# Patient Record
Sex: Female | Born: 1935
Health system: Southern US, Community
[De-identification: ages and names within clinical notes are randomized; demographics above are authoritative.]

## PROBLEM LIST (undated history)

## (undated) DIAGNOSIS — Z860100 Personal history of colon polyps, unspecified: Secondary | ICD-10-CM

## (undated) DIAGNOSIS — M2550 Pain in unspecified joint: Secondary | ICD-10-CM

## (undated) DIAGNOSIS — C3431 Malignant neoplasm of lower lobe, right bronchus or lung: Secondary | ICD-10-CM

## (undated) DIAGNOSIS — R112 Nausea with vomiting, unspecified: Secondary | ICD-10-CM

## (undated) DIAGNOSIS — G4700 Insomnia, unspecified: Secondary | ICD-10-CM

## (undated) DIAGNOSIS — J40 Bronchitis, not specified as acute or chronic: Secondary | ICD-10-CM

## (undated) DIAGNOSIS — J069 Acute upper respiratory infection, unspecified: Secondary | ICD-10-CM

## (undated) DIAGNOSIS — Z7189 Other specified counseling: Secondary | ICD-10-CM

## (undated) DIAGNOSIS — F419 Anxiety disorder, unspecified: Secondary | ICD-10-CM

## (undated) DIAGNOSIS — E039 Hypothyroidism, unspecified: Secondary | ICD-10-CM

## (undated) DIAGNOSIS — IMO0001 Reserved for inherently not codable concepts without codable children: Secondary | ICD-10-CM

## (undated) DIAGNOSIS — Z8669 Personal history of other diseases of the nervous system and sense organs: Secondary | ICD-10-CM

## (undated) DIAGNOSIS — Z923 Personal history of irradiation: Secondary | ICD-10-CM

## (undated) DIAGNOSIS — H9190 Unspecified hearing loss, unspecified ear: Secondary | ICD-10-CM

## (undated) DIAGNOSIS — R918 Other nonspecific abnormal finding of lung field: Secondary | ICD-10-CM

## (undated) DIAGNOSIS — J95811 Postprocedural pneumothorax: Secondary | ICD-10-CM

## (undated) DIAGNOSIS — E0590 Thyrotoxicosis, unspecified without thyrotoxic crisis or storm: Secondary | ICD-10-CM

## (undated) DIAGNOSIS — E7800 Pure hypercholesterolemia, unspecified: Secondary | ICD-10-CM

## (undated) DIAGNOSIS — M791 Myalgia, unspecified site: Secondary | ICD-10-CM

## (undated) DIAGNOSIS — C3490 Malignant neoplasm of unspecified part of unspecified bronchus or lung: Secondary | ICD-10-CM

## (undated) DIAGNOSIS — E785 Hyperlipidemia, unspecified: Secondary | ICD-10-CM

## (undated) DIAGNOSIS — Z9889 Other specified postprocedural states: Secondary | ICD-10-CM

## (undated) DIAGNOSIS — M549 Dorsalgia, unspecified: Secondary | ICD-10-CM

## (undated) DIAGNOSIS — C4490 Unspecified malignant neoplasm of skin, unspecified: Secondary | ICD-10-CM

## (undated) DIAGNOSIS — M1990 Unspecified osteoarthritis, unspecified site: Secondary | ICD-10-CM

## (undated) DIAGNOSIS — I10 Essential (primary) hypertension: Secondary | ICD-10-CM

## (undated) DIAGNOSIS — I749 Embolism and thrombosis of unspecified artery: Secondary | ICD-10-CM

## (undated) DIAGNOSIS — Z8489 Family history of other specified conditions: Secondary | ICD-10-CM

## (undated) HISTORY — PX: APPENDECTOMY: SHX54

## (undated) HISTORY — DX: Hyperlipidemia, unspecified: E78.5

## (undated) HISTORY — PX: HEMORRHOID SURGERY: SHX153

## (undated) HISTORY — DX: Other specified counseling: Z71.89

## (undated) HISTORY — PX: TONSILLECTOMY: SUR1361

## (undated) HISTORY — DX: Hypothyroidism, unspecified: E03.9

## (undated) HISTORY — DX: Unspecified osteoarthritis, unspecified site: M19.90

## (undated) HISTORY — DX: Postprocedural pneumothorax: J95.811

## (undated) HISTORY — DX: Essential (primary) hypertension: I10

## (undated) HISTORY — PX: EYE SURGERY: SHX253

## (undated) HISTORY — PX: DILATION AND CURETTAGE OF UTERUS: SHX78

## (undated) HISTORY — DX: Pure hypercholesterolemia, unspecified: E78.00

## (undated) HISTORY — PX: COLONOSCOPY: SHX174

## (undated) HISTORY — DX: Malignant neoplasm of lower lobe, right bronchus or lung: C34.31

## (undated) HISTORY — DX: Malignant neoplasm of unspecified part of unspecified bronchus or lung: C34.90

## (undated) HISTORY — DX: Myalgia, unspecified site: M79.10

## (undated) HISTORY — PX: CHOLECYSTECTOMY: SHX55

## (undated) HISTORY — PX: ROTATOR CUFF REPAIR: SHX139

## (undated) HISTORY — PX: ABDOMINAL HYSTERECTOMY: SHX81

---

## 1997-08-01 ENCOUNTER — Other Ambulatory Visit: Admission: RE | Admit: 1997-08-01 | Discharge: 1997-08-01 | Payer: Self-pay | Admitting: Cardiology

## 1997-10-31 ENCOUNTER — Observation Stay (HOSPITAL_COMMUNITY): Admission: RE | Admit: 1997-10-31 | Discharge: 1997-11-01 | Payer: Self-pay | Admitting: General Surgery

## 1998-05-09 ENCOUNTER — Ambulatory Visit (HOSPITAL_COMMUNITY): Admission: RE | Admit: 1998-05-09 | Discharge: 1998-05-09 | Payer: Self-pay | Admitting: General Surgery

## 1999-06-19 ENCOUNTER — Encounter: Admission: RE | Admit: 1999-06-19 | Discharge: 1999-06-19 | Payer: Self-pay | Admitting: Cardiology

## 1999-06-19 ENCOUNTER — Encounter: Payer: Self-pay | Admitting: Cardiology

## 1999-07-16 ENCOUNTER — Encounter: Payer: Self-pay | Admitting: Orthopedic Surgery

## 1999-07-16 ENCOUNTER — Encounter: Admission: RE | Admit: 1999-07-16 | Discharge: 1999-07-16 | Payer: Self-pay | Admitting: Orthopedic Surgery

## 1999-09-02 ENCOUNTER — Ambulatory Visit (HOSPITAL_COMMUNITY): Admission: RE | Admit: 1999-09-02 | Discharge: 1999-09-02 | Payer: Self-pay | Admitting: Gastroenterology

## 1999-09-02 ENCOUNTER — Encounter (INDEPENDENT_AMBULATORY_CARE_PROVIDER_SITE_OTHER): Payer: Self-pay | Admitting: Specialist

## 1999-11-19 ENCOUNTER — Ambulatory Visit (HOSPITAL_BASED_OUTPATIENT_CLINIC_OR_DEPARTMENT_OTHER): Admission: RE | Admit: 1999-11-19 | Discharge: 1999-11-20 | Payer: Self-pay | Admitting: Orthopedic Surgery

## 1999-11-19 ENCOUNTER — Encounter (INDEPENDENT_AMBULATORY_CARE_PROVIDER_SITE_OTHER): Payer: Self-pay | Admitting: *Deleted

## 2000-07-09 ENCOUNTER — Encounter: Payer: Self-pay | Admitting: Cardiology

## 2000-07-09 ENCOUNTER — Encounter: Admission: RE | Admit: 2000-07-09 | Discharge: 2000-07-09 | Payer: Self-pay | Admitting: Cardiology

## 2001-07-15 ENCOUNTER — Encounter: Payer: Self-pay | Admitting: Cardiology

## 2001-07-15 ENCOUNTER — Encounter: Admission: RE | Admit: 2001-07-15 | Discharge: 2001-07-15 | Payer: Self-pay | Admitting: Cardiology

## 2002-08-07 ENCOUNTER — Encounter: Admission: RE | Admit: 2002-08-07 | Discharge: 2002-08-07 | Payer: Self-pay | Admitting: Cardiology

## 2002-08-07 ENCOUNTER — Encounter: Payer: Self-pay | Admitting: Cardiology

## 2008-03-19 ENCOUNTER — Encounter: Admission: RE | Admit: 2008-03-19 | Discharge: 2008-03-19 | Payer: Self-pay | Admitting: Orthopedic Surgery

## 2009-08-08 ENCOUNTER — Encounter: Admission: RE | Admit: 2009-08-08 | Discharge: 2009-08-08 | Payer: Self-pay | Admitting: Otolaryngology

## 2009-11-29 ENCOUNTER — Ambulatory Visit: Payer: Self-pay | Admitting: Cardiology

## 2010-02-04 ENCOUNTER — Ambulatory Visit: Payer: Self-pay | Admitting: Cardiology

## 2010-02-27 ENCOUNTER — Encounter: Admission: RE | Admit: 2010-02-27 | Discharge: 2010-02-27 | Payer: Self-pay | Admitting: Neurosurgery

## 2010-06-12 ENCOUNTER — Ambulatory Visit (INDEPENDENT_AMBULATORY_CARE_PROVIDER_SITE_OTHER): Payer: Medicare Other | Admitting: Cardiology

## 2010-06-12 DIAGNOSIS — I119 Hypertensive heart disease without heart failure: Secondary | ICD-10-CM

## 2010-06-12 DIAGNOSIS — Z79899 Other long term (current) drug therapy: Secondary | ICD-10-CM

## 2010-06-12 DIAGNOSIS — E7800 Pure hypercholesterolemia, unspecified: Secondary | ICD-10-CM

## 2010-09-12 NOTE — Op Note (Signed)
[place]  Patient:    MATTEA, SEGER                        MRN: [id number] Proc. Date: 11/19/99 Adm. Date:  [id number] Attending:  Teena Dunk                           Operative Report  INDICATIONS:  A 77 year old with severe atrophic rotator cuff tear with weakness and pain, desired correction, possible overnight.  She was advised that this may or may not be possible based on the severity of her tear. Additionally, she complained of a small cyst lateral aspect of the ankle and wanted to have this removed as well.  PREOPERATIVE DIAGNOSES: 1. Severe complete tear infraspinatus and supraspinatus tendon right shoulder. 2. Labral tear. 3. Impingement acromioclavicular joint arthritis. 4. Cyst right ankle.  OPERATIONS: 1. Open rotator cuff repair acromioplasty. 2. Distal clavicle excision. 3. Arthroscopic debridement torn labrum. 4. Excision of cyst right ankle.  SURGEON:  Sharlot Gowda., M.D.  ANESTHESIA:  General.  DESCRIPTION OF PROCEDURE:  Arthroscope through posterior/anterior lateral portal.  Systematic inspection of the shoulder showed a severe rotator cuff tear with retraction.  The patients main complaint was pain with weakness and she desired correction if at all possible.  For this reason, after debridement of a degenerative labral tear, the procedure was converted to an open procedure. Splitting of the deltoid in the fascia over the distal clavicle with deltoid split 3 cm distal to the tip of the acromion.  With careful dissection, a very thick, hypertrophic AC joint was encountered with a very prominent acromion and CA ligament, calcification of the CA ligament requiring release of the CA ligament, as well as, resection of about 8-9 mm of the acromion and about the distal 1 cm of the clavicle.  The cuff itself was severely torn complete tear with severe retraction infraspinatus and supraspinatus with  tendon that was really very atrophic with extensive mobilization and freshening of the edges, the ______ cuff edges could be brought probably together, at least 80%.  It was converted to a longitudinal tear with three #2 nonabsorbable sutures.  Again, the apex could not be repaired back down bone-to-bone in its normal anatomic location and therefore, the large defect was converted to a small triangular-shaped defect. The edges of which were fixed with Mitek anchors.  The biceps tendon could not be identified, was chronically torn.  Given the severity of the problem, I felt that reasonable repair was obtained particularly given the tissue the patient had to work with.  The wound was irrigated, the deltoid was meticulously repaired with #1 Ti-Cron, the subcutaneous tissues with 2-0 Vicryl and the skin with the stapling device.  Portals were closed with staples.  On the lateral ankle, a small incision of about 3 cm was made and a cyst removed from the lateral aspect of the ankle.  Seemed to be almost involving a portion of a small branch of a vein that was a hardened type cyst, possibly consistent with a small AV malformation.  It was coagulated at each end, removed and sent to pathology.  Irrigation was accomplished, followed by closure with interrupted 4-0 nylon and Marcaine without epinephrine infiltrated in the wound.  A lightly compressive sterile dressing applied, taken to recovery room in stable condition. DD:  11/19/99 TD:  11/20/99 Job: [id number]

## 2010-09-12 NOTE — Procedures (Signed)
[place]  Patient:    Samantha Quinn, Samantha Quinn                        MRN: [id number] Proc. Date: 09/02/99 Adm. Date:  [id number] Attending:  Louie Bun CC:         Sheppard Plumber. Earlene Plater, M.D.             Thomas A. Patty Sermons, M.D.                           Procedure Report  PROCEDURE:  Colonoscopy with polypectomy.  INDICATIONS:  Occasional rectal bleeding and constipation in a 78 year old female with no prior colon screening.  PROCEDURE:  The patient was placed in the left lateral decubitus position and placed on the pulse monitor with continuous low-flow oxygen delivered by nasal cannula.  She was sedated with 62.5 mcg IV fentanyl and 5 mg IV Versed.  The Olympus video colonoscope was inserted into the rectum and advanced to the cecum, confirmed by transillumination of McBurneys point and visualization of the ileocecal valve and appendiceal orifice.  The prep was excellent.  The cecum, ascending, transverse, descending and sigmoid colon all appeared normal with no masses, polyps, diverticuli or other mucosal abnormalities.  Within the rectum, was seen an 8 mm sessile polyp, which was fulgurated by hot biopsy.  There were also some small internal hemorrhoids seen on retroflexion. The colonoscope was then withdrawn and the patient returned to the recovery room in stable condition.  She tolerated the procedure well and there were no immediate complications.  IMPRESSION: 1. Internal hemorrhoids. 2. An 8 mm rectal polyp.  PLAN:  Await histology for determination of need for future colon screening and will treat hemorrhoids symptomatically. DD:  09/02/99 TD:  09/03/99 Job: [id number]

## 2010-09-29 ENCOUNTER — Other Ambulatory Visit: Payer: Medicare Other | Admitting: *Deleted

## 2010-09-29 ENCOUNTER — Ambulatory Visit: Payer: Medicare Other | Admitting: Cardiology

## 2010-10-14 ENCOUNTER — Encounter: Payer: Self-pay | Admitting: *Deleted

## 2010-10-20 ENCOUNTER — Other Ambulatory Visit (INDEPENDENT_AMBULATORY_CARE_PROVIDER_SITE_OTHER): Payer: Medicare Other | Admitting: *Deleted

## 2010-10-20 ENCOUNTER — Encounter: Payer: Self-pay | Admitting: Cardiology

## 2010-10-20 ENCOUNTER — Ambulatory Visit (INDEPENDENT_AMBULATORY_CARE_PROVIDER_SITE_OTHER): Payer: Medicare Other | Admitting: Cardiology

## 2010-10-20 DIAGNOSIS — Z79899 Other long term (current) drug therapy: Secondary | ICD-10-CM

## 2010-10-20 DIAGNOSIS — E785 Hyperlipidemia, unspecified: Secondary | ICD-10-CM

## 2010-10-20 DIAGNOSIS — E7800 Pure hypercholesterolemia, unspecified: Secondary | ICD-10-CM

## 2010-10-20 DIAGNOSIS — I10 Essential (primary) hypertension: Secondary | ICD-10-CM | POA: Insufficient documentation

## 2010-10-20 DIAGNOSIS — M1990 Unspecified osteoarthritis, unspecified site: Secondary | ICD-10-CM

## 2010-10-20 DIAGNOSIS — E039 Hypothyroidism, unspecified: Secondary | ICD-10-CM

## 2010-10-20 DIAGNOSIS — E119 Type 2 diabetes mellitus without complications: Secondary | ICD-10-CM

## 2010-10-20 DIAGNOSIS — I119 Hypertensive heart disease without heart failure: Secondary | ICD-10-CM

## 2010-10-20 LAB — LIPID PANEL
HDL: 52 mg/dL (ref >39.00)
Total CHOL/HDL Ratio: 3
Triglycerides: 106 mg/dL (ref 0.0–149.0)
VLDL: 21.2 mg/dL (ref 0.0–40.0)

## 2010-10-20 LAB — HEPATIC FUNCTION PANEL
AST: 21 U/L (ref 0–37)
Albumin: 4.2 g/dL (ref 3.5–5.2)
Alkaline Phosphatase: 61 U/L (ref 39–117)
Bilirubin, Direct: 0.1 mg/dL (ref 0.0–0.3)

## 2010-10-20 LAB — BASIC METABOLIC PANEL
BUN: 19 mg/dL (ref 6–23)
Calcium: 9.1 mg/dL (ref 8.4–10.5)
Creatinine, Ser: 0.6 mg/dL (ref 0.4–1.2)
Glucose, Bld: 125 mg/dL — ABNORMAL HIGH (ref 70–99)
Potassium: 4.3 mEq/L (ref 3.5–5.1)
Sodium: 140 mEq/L (ref 135–145)

## 2010-10-20 NOTE — Assessment & Plan Note (Signed)
Patient has a history of essential hypertension.  She has not been experiencing any dizziness or syncope or headaches.  He has not been having any symptoms of congestive heart failure or angina pectoris.

## 2010-10-20 NOTE — Assessment & Plan Note (Signed)
The patient has a history of hypothyroidism.  In 1992 she had acute thyroiditis and multinodular goiter.  She has been on suppressive doses of Synthroid.  She is clinically euthyroid.

## 2010-10-20 NOTE — Assessment & Plan Note (Signed)
The patient has a history of adult onset diabetes mellitus.  She's not having any hypoglycemic episodes.  Her appetite is good.  Her weight has increased 2 pounds since last visit.

## 2010-10-20 NOTE — Assessment & Plan Note (Signed)
The patient has a long history of osteoarthritis and low back pain.  Recently she's been having more symptoms of sciatica and in the past has been told of a ruptured disc.  She had an epidural steroid injection in November 2011 which has helped her and she is seeing her neurosurgeon Dr. Channing Mutters next week who may offer her another epidural steroid injection.

## 2010-10-20 NOTE — Progress Notes (Signed)
Samantha Quinn Date of Birth:  07/10/1927 [facility] / [facility] [address]   [address] [place], [place]  [address] [phone]           Fax   [phone]  History of Present Illness: This pleasant 77 year old woman is seen for a scheduled 4 month followup office visit.  She has generally been feeling well.  She has a history of diabetes mellitus, essential hypertension, and hypothyroidism.  She has not been expressing any chest pain or shortness of breath.  She does not have any history of ischemic heart disease.  He does not have any history of congestive heart failure  Current Outpatient Prescriptions  Medication Sig Dispense Refill  . amitriptyline (ELAVIL) 10 MG tablet Take 10 mg by mouth at bedtime.        [name] aspirin 81 MG tablet Take 81 mg by mouth as needed.        . bisoprolol-hydrochlorothiazide (ZIAC) 2.5-6.25 MG per tablet Take 1 tablet by mouth daily.        . Calcium Carbonate-Vitamin D (CALTRATE 600+D PO) Take 1 tablet by mouth. occasionally       . ibuprofen (ADVIL,MOTRIN) 200 MG tablet Take 200 mg by mouth as needed.        [name] levothyroxine (SYNTHROID, LEVOTHROID) 100 MCG tablet Take 100 mcg by mouth every other day.        . lovastatin (MEVACOR) 20 MG tablet Take 20 mg by mouth at bedtime. Taking every other day      . metFORMIN (GLUMETZA) 500 MG (MOD) 24 hr tablet Take 500 mg by mouth 2 (two) times daily.        . Naproxen Sodium (ALEVE) 220 MG CAPS Take 1 capsule by mouth as needed.        . ramipril (ALTACE) 2.5 MG capsule Take 2.5 mg by mouth daily.        [name] DISCONTD: Acetaminophen (TYLENOL ARTHRITIS PAIN PO) Take 1 capsule by mouth as needed.          Allergies  Allergen Reactions  . Demerol     nausea    Patient Active Problem List  Diagnoses  . Benign hypertensive heart disease without heart failure  . Hypercholesterolemia  . Diabetes mellitus  . Hypothyroidism (acquired)  . Osteoarthritis    History  Smoking status  . Never  Smoker   Smokeless tobacco  . Not on file    History  Alcohol Use No    Family History  Problem Relation Age of Onset  . Arthritis Sister   . Arthritis Sister   . Diabetes Brother   . Hypertension Mother     Review of Systems: Constitutional: no fever chills diaphoresis or fatigue or change in weight.  Head and neck: no hearing loss, no epistaxis, no photophobia or visual disturbance. Respiratory: No cough, shortness of breath or wheezing. Cardiovascular: No chest pain peripheral edema, palpitations. Gastrointestinal: No abdominal distention, no abdominal pain, no change in bowel habits hematochezia or melena. Genitourinary: No dysuria, no frequency, no urgency, no nocturia. Musculoskeletal:No arthralgias,  no gait disturbance or myalgias. Neurological: No dizziness, no headaches, no numbness, no seizures, no syncope, no weakness, no tremors. Hematologic: No lymphadenopathy, no easy bruising. Psychiatric: No confusion, no hallucinations, no sleep disturbance.    Physical Exam: Filed Vitals:   10/20/10 0937  BP: 128/70  Pulse: 66  The general appearance feels a well-developed well-nourished woman in no distress.The head and neck exam reveals pupils equal  and reactive.  Extraocular movements are full.  There is no scleral icterus.  The mouth and pharynx are normal.  The neck is supple.  The carotids reveal no bruits.  The jugular venous pressure is normal.  The  thyroid is not enlarged.  There is no lymphadenopathy.  The chest is clear to percussion and auscultation.  There are no rales or rhonchi.  Expansion of the chest is symmetrical.  The precordium is quiet.  The first heart sound is normal.  The second heart sound is physiologically split.  There is no murmur gallop rub or click.  There is no abnormal lift or heave.  The abdomen is soft and nontender.  The bowel sounds are normal.  The liver and spleen are not enlarged.  There are no abdominal masses.  There are no abdominal  bruits.  Extremities reveal good pedal pulses.  There is no phlebitis or edema.  There is no cyanosis or clubbing.  Strength is normal and symmetrical in all extremities.  There is no lateralizing weakness.  There are no sensory deficits.  The skin is warm and dry.  There is no rash.   Assessment / Plan: Continue same medication.  Recheck in 4 months for office visit and fasting lab work.

## 2010-10-22 ENCOUNTER — Telehealth: Payer: Self-pay | Admitting: *Deleted

## 2010-10-22 NOTE — Telephone Encounter (Signed)
Advised patient of lab work

## 2010-11-03 ENCOUNTER — Other Ambulatory Visit: Payer: Self-pay | Admitting: Neurosurgery

## 2010-11-03 DIAGNOSIS — M5126 Other intervertebral disc displacement, lumbar region: Secondary | ICD-10-CM

## 2010-11-07 ENCOUNTER — Ambulatory Visit
Admission: RE | Admit: 2010-11-07 | Discharge: 2010-11-07 | Disposition: A | Discharge status: Home / Self Care | Payer: Medicare Other | Source: Ambulatory Visit | Attending: Neurosurgery | Admitting: Neurosurgery

## 2010-11-07 VITALS — BP 140/64 | HR 61

## 2010-11-07 DIAGNOSIS — M5126 Other intervertebral disc displacement, lumbar region: Secondary | ICD-10-CM

## 2010-11-07 MED ORDER — IOHEXOL 180 MG/ML  SOLN
1.0000 mL | Freq: Once | INTRAMUSCULAR | Status: AC | PRN
  Administered 2010-11-07: 1 mL via EPIDURAL

## 2010-11-07 MED ORDER — METHYLPREDNISOLONE ACETATE 40 MG/ML INJ SUSP (RADIOLOG
120.0000 mg | Freq: Once | INTRAMUSCULAR | Status: AC
  Administered 2010-11-07: 120 mg via EPIDURAL

## 2011-01-05 ENCOUNTER — Telehealth: Payer: Self-pay | Admitting: Cardiology

## 2011-01-05 NOTE — Telephone Encounter (Signed)
Pt received recall letter to schedule 4 MTH OV, Lp, HFP, Bmet,TSH, FT4.  Dr. Yevonne Pax next available OV morning visit is not until 12/10.  Pt would like to try to get in earlier and would like to speak with nurse.  Please call pt for same day if able to get in earlier.

## 2011-01-05 NOTE — Telephone Encounter (Signed)
Appointment scheduled.

## 2011-02-27 ENCOUNTER — Encounter: Payer: Self-pay | Admitting: Cardiology

## 2011-02-27 ENCOUNTER — Ambulatory Visit (INDEPENDENT_AMBULATORY_CARE_PROVIDER_SITE_OTHER): Payer: Medicare Other | Admitting: Cardiology

## 2011-02-27 ENCOUNTER — Other Ambulatory Visit (INDEPENDENT_AMBULATORY_CARE_PROVIDER_SITE_OTHER): Payer: Medicare Other | Admitting: *Deleted

## 2011-02-27 VITALS — BP 138/78 | HR 66 | Ht 61.0 in | Wt 131.0 lb

## 2011-02-27 DIAGNOSIS — E039 Hypothyroidism, unspecified: Secondary | ICD-10-CM

## 2011-02-27 DIAGNOSIS — Z79899 Other long term (current) drug therapy: Secondary | ICD-10-CM

## 2011-02-27 DIAGNOSIS — I119 Hypertensive heart disease without heart failure: Secondary | ICD-10-CM

## 2011-02-27 DIAGNOSIS — E7800 Pure hypercholesterolemia, unspecified: Secondary | ICD-10-CM

## 2011-02-27 DIAGNOSIS — E785 Hyperlipidemia, unspecified: Secondary | ICD-10-CM

## 2011-02-27 DIAGNOSIS — M1990 Unspecified osteoarthritis, unspecified site: Secondary | ICD-10-CM

## 2011-02-27 DIAGNOSIS — E119 Type 2 diabetes mellitus without complications: Secondary | ICD-10-CM

## 2011-02-27 LAB — LIPID PANEL
Cholesterol: 162 mg/dL (ref 0–200)
HDL: 53.2 mg/dL (ref >39.00)

## 2011-02-27 LAB — HEPATIC FUNCTION PANEL
AST: 21 U/L (ref 0–37)
Albumin: 4 g/dL (ref 3.5–5.2)
Alkaline Phosphatase: 57 U/L (ref 39–117)

## 2011-02-27 LAB — BASIC METABOLIC PANEL
BUN: 16 mg/dL (ref 6–23)
Calcium: 9 mg/dL (ref 8.4–10.5)
Potassium: 3.8 mEq/L (ref 3.5–5.1)

## 2011-02-27 NOTE — Assessment & Plan Note (Signed)
Patient is on lovastatin 20 mg daily.  She is not having any myalgias from the lovastatin.  Her lipids today are satisfactory.

## 2011-02-27 NOTE — Assessment & Plan Note (Signed)
The patient continues to have a lot of problems with sciatic-type pain in her left lower back and left leg.  She did have an epidural steroid in July.  She does not intend to have any further injections if she can avoid it.

## 2011-02-27 NOTE — Progress Notes (Signed)
[name] Date of Birth:  05/19/1927 [facility] / [facility] [address]   [address] [place], [place]  [address] [phone]           Fax   [phone]  History of Present Illness: This pleasant 77 year old woman is seen for a scheduled four-month followup office visit.  She has generally been feeling well.  She does have history of diabetes, essential hypertension, and hypothyroidism.  Since last, visit she's not been experiencing any cardiac symptoms.  She does not have any history of ischemic heart disease.  Current Outpatient Prescriptions  Medication Sig Dispense Refill  . amitriptyline (ELAVIL) 10 MG tablet Take 10 mg by mouth at bedtime.        . bisoprolol-hydrochlorothiazide (ZIAC) 2.5-6.25 MG per tablet Take 1 tablet by mouth daily.        . Calcium Carbonate-Vitamin D (CALTRATE 600+D PO) Take 1 tablet by mouth. occasionally       . ibuprofen (ADVIL,MOTRIN) 200 MG tablet Take 200 mg by mouth as needed.        [name] levothyroxine (SYNTHROID, LEVOTHROID) 100 MCG tablet Take 100 mcg by mouth every other day.        . lovastatin (MEVACOR) 20 MG tablet Take 20 mg by mouth at bedtime. Taking every other day      . metFORMIN (GLUMETZA) 500 MG (MOD) 24 hr tablet Take 500 mg by mouth 2 (two) times daily.        . Naproxen Sodium (ALEVE) 220 MG CAPS Take 1 capsule by mouth as needed.        . ramipril (ALTACE) 2.5 MG capsule Take 2.5 mg by mouth daily.        [name] tolterodine (DETROL) 2 MG tablet 2 mg by Per post-pyloric tube route Daily. Per gyne        Allergies  Allergen Reactions  . Demerol     nausea    Patient Active Problem List  Diagnoses  . Benign hypertensive heart disease without heart failure  . Hypercholesterolemia  . Diabetes mellitus  . Hypothyroidism (acquired)  . Osteoarthritis    History  Smoking status  . Never Smoker   Smokeless tobacco  . Not on file    History  Alcohol Use No    Family History  Problem Relation Age of Onset    . Arthritis Sister   . Arthritis Sister   . Diabetes Brother   . Hypertension Mother     Review of Systems: Constitutional: no fever chills diaphoresis or fatigue or change in weight.  Head and neck: no hearing loss, no epistaxis, no photophobia or visual disturbance. Respiratory: No cough, shortness of breath or wheezing. Cardiovascular: No chest pain peripheral edema, palpitations. Gastrointestinal: No abdominal distention, no abdominal pain, no change in bowel habits hematochezia or melena. Genitourinary: No dysuria, no frequency, no urgency, no nocturia. Musculoskeletal:No arthralgias, no back pain, no gait disturbance or myalgias. Neurological: No dizziness, no headaches, no numbness, no seizures, no syncope, no weakness, no tremors. Hematologic: No lymphadenopathy, no easy bruising. Psychiatric: No confusion, no hallucinations, no sleep disturbance.    Physical Exam: Filed Vitals:   02/27/11 1150  BP: 138/78  Pulse: 66   general appearance reveals a well-developed, well-nourished woman in no distress.The head and neck exam reveals pupils equal and reactive.  Extraocular movements are full.  There is no scleral icterus.  The mouth and pharynx are normal.  The neck is supple.  The carotids reveal no bruits.  The jugular venous pressure is normal.  The  thyroid is not enlarged.  There is no lymphadenopathy.  The chest is clear to percussion and auscultation.  There are no rales or rhonchi.  Expansion of the chest is symmetrical.  The precordium is quiet.  The first heart sound is normal.  The second heart sound is physiologically split.  There is no murmur gallop rub or click.  There is no abnormal lift or heave.  The abdomen is soft and nontender.  The bowel sounds are normal.  The liver and spleen are not enlarged.  There are no abdominal masses.  There are no abdominal bruits.  Extremities reveal good pedal pulses.  There is no phlebitis or edema.  There is no cyanosis or clubbing.   Strength is normal and symmetrical in all extremities.  There is no lateralizing weakness.  There are no sensory deficits.  The skin is warm and dry.  There is no rash.     Assessment / Plan: Continue same medication.  Recheck in 4 months for followup office visit and fasting lab work including thyroid function studies

## 2011-02-27 NOTE — Patient Instructions (Signed)
Your physician recommends that you continue on your current medications as directed. Please refer to the Current Medication list given to you today. Your physician recommends that you schedule a follow-up appointment in: 4 months with fasting labs  

## 2011-02-27 NOTE — Assessment & Plan Note (Signed)
The patient has a history of diabetes.  She was on metformin.  She has not been expressing any hypoglycemic episodes.

## 2011-02-27 NOTE — Assessment & Plan Note (Signed)
The patient has not been expressing any headaches or dizziness.  No chest pain or shortness of breath.

## 2011-03-04 ENCOUNTER — Telehealth: Payer: Self-pay | Admitting: *Deleted

## 2011-03-04 NOTE — Telephone Encounter (Signed)
Message copied by Burnell Blanks on Wed Mar 04, 2011  5:20 PM ------      Message from: [name]      Created: Sun Mar 01, 2011  6:22 PM       Thyroid function stable.      Please report.  The labs are stable.  Continue same meds.  Continue careful diet.

## 2011-03-04 NOTE — Telephone Encounter (Signed)
Advised of labs and mailed copy 

## 2011-04-28 DIAGNOSIS — J069 Acute upper respiratory infection, unspecified: Secondary | ICD-10-CM

## 2011-04-28 HISTORY — PX: TOE SURGERY: SHX1073

## 2011-04-28 HISTORY — DX: Acute upper respiratory infection, unspecified: J06.9

## 2011-04-30 DIAGNOSIS — L03039 Cellulitis of unspecified toe: Secondary | ICD-10-CM | POA: Diagnosis not present

## 2011-04-30 DIAGNOSIS — L02619 Cutaneous abscess of unspecified foot: Secondary | ICD-10-CM | POA: Diagnosis not present

## 2011-05-05 DIAGNOSIS — L97509 Non-pressure chronic ulcer of other part of unspecified foot with unspecified severity: Secondary | ICD-10-CM | POA: Diagnosis not present

## 2011-05-14 DIAGNOSIS — L97509 Non-pressure chronic ulcer of other part of unspecified foot with unspecified severity: Secondary | ICD-10-CM | POA: Diagnosis not present

## 2011-05-18 DIAGNOSIS — J069 Acute upper respiratory infection, unspecified: Secondary | ICD-10-CM | POA: Diagnosis not present

## 2011-05-25 DIAGNOSIS — B372 Candidiasis of skin and nail: Secondary | ICD-10-CM | POA: Diagnosis not present

## 2011-05-25 DIAGNOSIS — L84 Corns and callosities: Secondary | ICD-10-CM | POA: Diagnosis not present

## 2011-05-25 DIAGNOSIS — C44320 Squamous cell carcinoma of skin of unspecified parts of face: Secondary | ICD-10-CM | POA: Diagnosis not present

## 2011-05-25 DIAGNOSIS — L578 Other skin changes due to chronic exposure to nonionizing radiation: Secondary | ICD-10-CM | POA: Diagnosis not present

## 2011-05-25 DIAGNOSIS — D0439 Carcinoma in situ of skin of other parts of face: Secondary | ICD-10-CM | POA: Diagnosis not present

## 2011-05-26 ENCOUNTER — Other Ambulatory Visit: Payer: Self-pay | Admitting: Cardiology

## 2011-05-26 DIAGNOSIS — L97509 Non-pressure chronic ulcer of other part of unspecified foot with unspecified severity: Secondary | ICD-10-CM | POA: Diagnosis not present

## 2011-06-08 DIAGNOSIS — M79609 Pain in unspecified limb: Secondary | ICD-10-CM | POA: Diagnosis not present

## 2011-06-12 DIAGNOSIS — M2040 Other hammer toe(s) (acquired), unspecified foot: Secondary | ICD-10-CM | POA: Diagnosis not present

## 2011-06-13 DIAGNOSIS — K2900 Acute gastritis without bleeding: Secondary | ICD-10-CM | POA: Diagnosis not present

## 2011-06-13 DIAGNOSIS — Z9889 Other specified postprocedural states: Secondary | ICD-10-CM | POA: Diagnosis not present

## 2011-06-13 DIAGNOSIS — K2970 Gastritis, unspecified, without bleeding: Secondary | ICD-10-CM | POA: Diagnosis present

## 2011-06-13 DIAGNOSIS — R404 Transient alteration of awareness: Secondary | ICD-10-CM | POA: Diagnosis not present

## 2011-06-13 DIAGNOSIS — E7800 Pure hypercholesterolemia, unspecified: Secondary | ICD-10-CM | POA: Diagnosis present

## 2011-06-13 DIAGNOSIS — R55 Syncope and collapse: Secondary | ICD-10-CM | POA: Diagnosis present

## 2011-06-13 DIAGNOSIS — R1110 Vomiting, unspecified: Secondary | ICD-10-CM | POA: Diagnosis not present

## 2011-06-13 DIAGNOSIS — E119 Type 2 diabetes mellitus without complications: Secondary | ICD-10-CM | POA: Diagnosis not present

## 2011-06-13 DIAGNOSIS — K2990 Gastroduodenitis, unspecified, without bleeding: Secondary | ICD-10-CM | POA: Diagnosis not present

## 2011-06-13 DIAGNOSIS — J189 Pneumonia, unspecified organism: Secondary | ICD-10-CM | POA: Diagnosis not present

## 2011-06-13 DIAGNOSIS — R222 Localized swelling, mass and lump, trunk: Secondary | ICD-10-CM | POA: Diagnosis not present

## 2011-06-13 DIAGNOSIS — R112 Nausea with vomiting, unspecified: Secondary | ICD-10-CM | POA: Diagnosis not present

## 2011-06-13 DIAGNOSIS — E876 Hypokalemia: Secondary | ICD-10-CM | POA: Diagnosis not present

## 2011-06-13 DIAGNOSIS — S1093XA Contusion of unspecified part of neck, initial encounter: Secondary | ICD-10-CM | POA: Diagnosis present

## 2011-06-13 DIAGNOSIS — Z79899 Other long term (current) drug therapy: Secondary | ICD-10-CM | POA: Diagnosis not present

## 2011-06-13 DIAGNOSIS — E039 Hypothyroidism, unspecified: Secondary | ICD-10-CM | POA: Diagnosis not present

## 2011-06-15 ENCOUNTER — Telehealth: Payer: Self-pay | Admitting: Cardiology

## 2011-06-15 NOTE — Telephone Encounter (Signed)
I personally reviewed her CT scan.  The right upper quadrant lesion is worrisome.  We will refer her to thoracic surgery for further evaluation.

## 2011-06-15 NOTE — Telephone Encounter (Signed)
New Msg: Pt calling wanting to speak with nurse/MD ASAP. Pt has been in the hospital and pt said a problem has come up and she needs to speak with Dr. Patty Sermons considering he is her PCP. Please return pt call to discuss further.

## 2011-06-15 NOTE — Telephone Encounter (Signed)
Dr Lestine Box did surgery on toe Friday.  Saturday morning had syncopal episode and nausea.  Did several test and found spot on lung they didn't think it was pneumonia.  Information on cart for review.  Would like to schedule an appointment to discuss these findings.

## 2011-06-15 NOTE — Telephone Encounter (Signed)
Patient coming in for office visit thursday

## 2011-06-15 NOTE — Telephone Encounter (Signed)
FU Call: Pt daughter calling wanting to speak with nurse to inform that pt power just went out and to contact pt please contact pt via cell phone. Please return pt call to discuss further.

## 2011-06-18 ENCOUNTER — Ambulatory Visit (INDEPENDENT_AMBULATORY_CARE_PROVIDER_SITE_OTHER): Payer: Medicare Other | Admitting: Cardiology

## 2011-06-18 ENCOUNTER — Ambulatory Visit
Admission: RE | Admit: 2011-06-18 | Discharge: 2011-06-18 | Disposition: A | Discharge status: Home / Self Care | Payer: Medicare Other | Source: Ambulatory Visit | Attending: Cardiology | Admitting: Cardiology

## 2011-06-18 ENCOUNTER — Encounter: Payer: Self-pay | Admitting: Cardiology

## 2011-06-18 VITALS — BP 120/78 | HR 78 | Ht 61.0 in | Wt 131.0 lb

## 2011-06-18 DIAGNOSIS — I119 Hypertensive heart disease without heart failure: Secondary | ICD-10-CM

## 2011-06-18 DIAGNOSIS — R918 Other nonspecific abnormal finding of lung field: Secondary | ICD-10-CM | POA: Diagnosis not present

## 2011-06-18 DIAGNOSIS — E7800 Pure hypercholesterolemia, unspecified: Secondary | ICD-10-CM

## 2011-06-18 DIAGNOSIS — R9389 Abnormal findings on diagnostic imaging of other specified body structures: Secondary | ICD-10-CM

## 2011-06-18 DIAGNOSIS — R059 Cough, unspecified: Secondary | ICD-10-CM

## 2011-06-18 DIAGNOSIS — E119 Type 2 diabetes mellitus without complications: Secondary | ICD-10-CM

## 2011-06-18 DIAGNOSIS — R55 Syncope and collapse: Secondary | ICD-10-CM

## 2011-06-18 DIAGNOSIS — R05 Cough: Secondary | ICD-10-CM | POA: Diagnosis not present

## 2011-06-18 DIAGNOSIS — E039 Hypothyroidism, unspecified: Secondary | ICD-10-CM

## 2011-06-18 NOTE — Assessment & Plan Note (Signed)
The patient is clinically euthyroid on current replacement therapy.

## 2011-06-18 NOTE — Assessment & Plan Note (Signed)
Pressure has been stable on current therapy.  No headaches or dizziness.  Her syncope was secondary to adverse allergic reaction to tramadol.  She is also very allergic to codeine and to Demerol

## 2011-06-18 NOTE — Patient Instructions (Signed)
A chest x-ray takes a picture of the organs and structures inside the chest, including the heart, lungs, and blood vessels. This test can show several things, including, whether the heart is enlarges; whether fluid is building up in the lungs; and whether pacemaker / defibrillator leads are still in place.  Please go to [facility] for this xray.

## 2011-06-18 NOTE — Progress Notes (Signed)
[name] Date of Birth:  November 13, 1927 [facility] [address] [place], [place]  [address] [phone]         Fax   [phone]  History of Present Illness: This pleasant 78 year old woman is seen for a can followup office visit.  She is being worked in because of an abnormal chest x-ray she was in her usual state of health until about a week ago when she had an episode of syncope after taking tramadol for foot pain following some minor foot surgery.  She was taken to [facility] because of the syncopal episode.  Workup at [facility] showed an abnormal chest x-ray and she was admitted with suspected possible pneumonia.  She had a CT scan of the chest which showed a right apical lung mass.  She brought the CT scan to our office earlier this week for review and we suggested that she come in today for further discussion and evaluation.  Note is the fact that she had had a chest cold for about the previous 4 weeks but her cough is actually better now.  She is not bringing forth any sputum.  The patient has a past history of diabetes, hypertension, and hypothyroidism.  Does not have any history of known ischemic heart disease.  He has had hypercholesterolemia and is on lovastatin.  Is clinically euthyroid.  Current Outpatient Prescriptions  Medication Sig Dispense Refill  . amitriptyline (ELAVIL) 10 MG tablet Take 10 mg by mouth at bedtime.        . bisoprolol-hydrochlorothiazide (ZIAC) 2.5-6.25 MG per tablet TAKE ONE TABLET BY MOUTH EVERY DAY  90 tablet  3  . ibuprofen (ADVIL,MOTRIN) 200 MG tablet Take 200 mg by mouth as needed.        [name] levothyroxine (SYNTHROID, LEVOTHROID) 100 MCG tablet Take 100 mcg by mouth every other day.        . lovastatin (MEVACOR) 20 MG tablet Take 20 mg by mouth at bedtime. Taking every other day      . metFORMIN (GLUCOPHAGE) 500 MG tablet TAKE ONE TABLET BY MOUTH TWICE DAILY  180 tablet  3  . Naproxen Sodium (ALEVE) 220 MG CAPS  Take 1 capsule by mouth as needed.        . ramipril (ALTACE) 2.5 MG capsule Take 2.5 mg by mouth daily.        [name] tolterodine (DETROL) 2 MG tablet 2 mg by Per post-pyloric tube route Daily. Per gyne        Allergies  Allergen Reactions  . Codeine   . Demerol     nausea  . Tramadol     syncope    Patient Active Problem List  Diagnoses  . Benign hypertensive heart disease without heart failure  . Hypercholesterolemia  . Diabetes mellitus  . Hypothyroidism (acquired)  . Osteoarthritis    History  Smoking status  . Never Smoker   Smokeless tobacco  . Not on file    History  Alcohol Use No    Family History  Problem Relation Age of Onset  . Arthritis Sister   . Arthritis Sister   . Diabetes Brother   . Hypertension Mother     Review of Systems: Constitutional: no fever chills diaphoresis or fatigue or change in weight.  Head and neck: no hearing loss, no epistaxis, no photophobia or visual disturbance. Respiratory: No cough, shortness of breath or wheezing. Cardiovascular: No chest pain peripheral edema, palpitations. Gastrointestinal: No abdominal distention, no abdominal pain,  no change in bowel habits hematochezia or melena. Genitourinary: No dysuria, no frequency, no urgency, no nocturia. Musculoskeletal:No arthralgias, no back pain, no gait disturbance or myalgias. Neurological: No dizziness, no headaches, no numbness, no seizures, no syncope, no weakness, no tremors. Hematologic: No lymphadenopathy, no easy bruising. Psychiatric: No confusion, no hallucinations, no sleep disturbance.    Physical Exam: Filed Vitals:   06/18/11 1430  BP: 120/78  Pulse: 78   the general appearance reveals a well-developed well-nourished woman in no distress.Pupils equal and reactive.   Extraocular Movements are full.  There is no scleral icterus.  The mouth and pharynx are normal.  The neck is supple.  The carotids reveal no bruits.  The jugular venous pressure is normal.   The thyroid is not enlarged.  There is no lymphadenopathy.  The chest is clear to percussion and auscultation. There are no rales or rhonchi. Expansion of the chest is symmetrical.  The precordium is quiet.  The first heart sound is normal.  The second heart sound is physiologically split.  There is no murmur gallop rub or click.  There is no abnormal lift or heave.  The abdomen is soft and nontender. Bowel sounds are normal. The liver and spleen are not enlarged. There Are no abdominal masses. There are no bruits.  Extremities no phlebitis or edema.  She is in a walking boot following her recent toe surgeryThe skin is warm and dry.  There is no rash.    Assessment / Plan: Continue same medication.  We will work on referral to thoracic surgery.

## 2011-06-18 NOTE — Assessment & Plan Note (Signed)
The patient had not had a chest x-ray in our office since 2008.  That time there was no evidence of any lung mass and the lungs were clear on the x-ray done on 12/15/06 and read by [facility] over read services.  He moves the recent abnormal CT evaluation we sent the patient today to [facility] imaging where she had a PA and lateral chest x-ray which shows persistence of the right apical mass lesion.  The radiologist felt that it might be slightly possibly improved but not a significant change from his evaluation of the previous CT scan.  The patient is concerned about this and we will arrange for her to see a thoracic surgeon.  She was familiar with Dr. Edwyna Shell who was her deceased husband's heart surgeon 20 years ago.  I explained to her that Dr. Edwyna Shell had announced his retirement and might not be available, in which case we would ask Dr. Tyrone Sage to see her.  The question will be whether we should just do watchful waiting with serial chest x-rays or proceed with a diagnostic biopsy procedure of some type.

## 2011-06-19 ENCOUNTER — Telehealth: Payer: Self-pay | Admitting: *Deleted

## 2011-06-19 ENCOUNTER — Telehealth: Payer: Self-pay | Admitting: Cardiology

## 2011-06-19 NOTE — Telephone Encounter (Signed)
Left message

## 2011-06-19 NOTE — Telephone Encounter (Signed)
Called Dr Dennie Maizes office and confirmed that information received.  Advised patient would call as soon as heard something

## 2011-06-19 NOTE — Telephone Encounter (Signed)
Fu call °Patient returning your call °

## 2011-06-19 NOTE — Telephone Encounter (Signed)
Message copied by Burnell Blanks on Fri Jun 19, 2011  4:42 PM ------      Message from: [name]      Created: Fri Jun 19, 2011 12:17 PM       As we discussed, please refer her to Dr. Tyrone Sage for her further evaluation and treatment of her right upper lobe lung mass.

## 2011-06-19 NOTE — Telephone Encounter (Signed)
Advised of xray and sent to Dr Tyrone Sage

## 2011-06-19 NOTE — Telephone Encounter (Signed)
Pt's dtr calling re pt was told by dr Patty Sermons that he would call her this am and is worried he wont call before he leaves today

## 2011-06-21 ENCOUNTER — Other Ambulatory Visit: Payer: Self-pay | Admitting: Cardiology

## 2011-06-22 NOTE — Telephone Encounter (Signed)
Refilled ramipril. 

## 2011-06-23 ENCOUNTER — Telehealth: Payer: Self-pay | Admitting: Cardiology

## 2011-06-23 NOTE — Telephone Encounter (Signed)
Spoke with [employer] and information on Dr [employer] to review.  They will call as soon as they have appointment.  Advised patient

## 2011-06-23 NOTE — Telephone Encounter (Signed)
Patient very anxious about getting appointment with surgeon (Dr Tyrone Sage).  Did call the office and left Dawn a message to call back and discuss

## 2011-06-23 NOTE — Telephone Encounter (Signed)
New problem   Patient request return phone call concerning med treatment, she can be reached at hm# [phone]

## 2011-06-24 ENCOUNTER — Other Ambulatory Visit: Payer: Self-pay | Admitting: Cardiothoracic Surgery

## 2011-06-24 ENCOUNTER — Telehealth: Payer: Self-pay | Admitting: *Deleted

## 2011-06-24 ENCOUNTER — Encounter: Payer: Self-pay | Admitting: Cardiothoracic Surgery

## 2011-06-24 ENCOUNTER — Institutional Professional Consult (permissible substitution) (INDEPENDENT_AMBULATORY_CARE_PROVIDER_SITE_OTHER): Payer: Medicare Other | Admitting: Cardiothoracic Surgery

## 2011-06-24 VITALS — BP 140/83 | HR 86 | Resp 20 | Ht 61.0 in | Wt 128.0 lb

## 2011-06-24 DIAGNOSIS — R55 Syncope and collapse: Secondary | ICD-10-CM | POA: Diagnosis not present

## 2011-06-24 DIAGNOSIS — R222 Localized swelling, mass and lump, trunk: Secondary | ICD-10-CM | POA: Diagnosis not present

## 2011-06-24 DIAGNOSIS — D381 Neoplasm of uncertain behavior of trachea, bronchus and lung: Secondary | ICD-10-CM

## 2011-06-24 DIAGNOSIS — C3431 Malignant neoplasm of lower lobe, right bronchus or lung: Secondary | ICD-10-CM | POA: Insufficient documentation

## 2011-06-24 DIAGNOSIS — C3490 Malignant neoplasm of unspecified part of unspecified bronchus or lung: Secondary | ICD-10-CM

## 2011-06-24 HISTORY — DX: Malignant neoplasm of lower lobe, right bronchus or lung: C34.31

## 2011-06-24 HISTORY — DX: Malignant neoplasm of unspecified part of unspecified bronchus or lung: C34.90

## 2011-06-24 NOTE — Progress Notes (Signed)
[address]            [place] [address]          [phone]      NAAMAH BOGGESS [facility] Medical Record #[id number] Date of Birth: 06/07/1927  Referring: [name], MD Primary Care: No primary provider on file.  Chief Complaint:    Chief Complaint  Patient presents with  . Lung Mass    Referral from Dr Patty Sermons for eval on RUL lung mass, Chest CT     History of Present Illness:    Patient is an 78 year old female lifelong nonsmoker who had an chest x-ray done in [place] last week. She had orthopedic surgery the day prior, took an Ultram the morning after surgery and had a syncopal episode. Because of a syncopal episode she was admitted to [facility] and observed. Chest x-ray revealed opacity in the right upper lobe, further evaluation with a CT scan of the chest was performed. The patient notes that she had some upper respiratory tract infection symptoms in early January with cough several days of fever, but this had resolved the week prior to her orthopedic surgery.     Current Activity/ Functional Status: Patient is independent with mobility/ambulation, transfers, ADL's, IADL's.   Past Medical History  Diagnosis Date  . Diabetes mellitus   . Hyperlipidemia   . Hypertension   . Hypothyroidism   . Hypercholesterolemia   . Osteoarthritis   . Dyslipidemia   . Myalgia     Past Surgical History  Procedure Date  . Appendectomy   . Abdominal hysterectomy   . Cholecystectomy   . Hemorrhoid surgery     Family History  Problem Relation Age of Onset  . Arthritis Sister   . Arthritis Sister   . Diabetes Brother   . Hypertension Mother     History   Social History  . Marital Status: Married    Spouse Name: N/A    Number of Children: N/A  . Years of Education: N/A    Social History Main Topics  . Smoking status: Never Smoker   . Smokeless tobacco: Not on file  . Alcohol Use: No  . Drug Use: No   Other Topics  Concern  . Not on file   Social History Narrative  . No narrative on file    History  Smoking status  . Never Smoker   Smokeless tobacco  . Not on file    History  Alcohol Use No     Allergies  Allergen Reactions  . Codeine   . Demerol     nausea  . Tramadol     syncope    Current Outpatient Prescriptions  Medication Sig Dispense Refill  . amitriptyline (ELAVIL) 10 MG tablet Take 10 mg by mouth at bedtime.        . bisoprolol-hydrochlorothiazide (ZIAC) 2.5-6.25 MG per tablet TAKE ONE TABLET BY MOUTH EVERY DAY  90 tablet  3  . ibuprofen (ADVIL,MOTRIN) 200 MG tablet Take 200 mg by mouth as needed.        [name] levothyroxine (SYNTHROID, LEVOTHROID) 100 MCG tablet Take 100 mcg by mouth every other day.        . lovastatin (MEVACOR) 20 MG tablet Take 20 mg by mouth at bedtime. Taking every other day      . metFORMIN (GLUCOPHAGE) 500 MG tablet TAKE ONE TABLET BY MOUTH  TWICE DAILY  180 tablet  3  . ramipril (ALTACE) 2.5 MG capsule TAKE ONE CAPSULE BY MOUTH EVERY DAY  90 capsule  3  . vitamin C (ASCORBIC ACID) 500 MG tablet Take 500 mg by mouth daily.      [name] tolterodine (DETROL) 2 MG tablet 2 mg by Per post-pyloric tube route Daily. Per gyne           Review of Systems:     Cardiac Review of Systems: Y or N  Chest Pain [ n   ]  Resting SOB [ n  ] Exertional SOB  [ n ]  Orthopnea [n  ]   Pedal Edema [  n ]    Palpitations [  ] Syncope  [ y ]   Presyncope [n ]  General Review of Systems: [Y] = yes [  ]=no Constitional: recent weight change [  ]; anorexia [  ]; fatigue [  ]; nausea [  ]; night sweats [  ]; fever [  ]; or chills [  ];                                                                                                                                          Dental: poor dentition[  n];   Eye : blurred vision [  ]; diplopia [   ]; vision changes [  ];  Amaurosis fugax[  ]; Resp: cough [  ];  wheezing[  ];  hemoptysis[  ]; shortness of breath[  ]; paroxysmal nocturnal  dyspnea[  ]; dyspnea on exertion[  ]; or orthopnea[  ];  GI:  gallstones[  ], vomiting[  ];  dysphagia[  ]; melena[  ];  hematochezia [  ]; heartburn[  ];   Hx of  Colonoscopy[  ]; GU: kidney stones [  ]; hematuria[  ];   dysuria [  ];  nocturia[  ];  history of     obstruction [  ];             Skin: rash, swelling[  ];, hair loss[  ];  peripheral edema[  ];  or itching[  ]; Musculosketetal: myalgias[  ];  joint swelling[  ];  joint erythema[  ];  joint pain[  ];  back pain[  ];  Heme/Lymph: bruising[  ];  bleeding[  ];  anemia[  ];  Neuro: TIA[  ];  headaches[  ];  stroke[ n ];  vertigo[y  ];  seizures[ n ];   paresthesias[  n];  difficulty walking[ n ];  Psych:depression[ n ]; anxiety[ n ];  Endocrine: diabetes[  ];  thyroid dysfunction[ n ];  Immunizations: Flu [?  ]; Pneumococcal[  ?];  Other:  Physical Exam: BP 140/83  Pulse 86  Resp 20  Ht 5\' 1"  (1.549 m)  Wt 128 lb (58.06 kg)  BMI 24.19 kg/m2  SpO2 98%  General appearance: alert, cooperative, appears stated age and no distress Neurologic: intact Heart: regular rate and rhythm, S1, S2 normal, no murmur, click, rub or gallop Lungs: clear to auscultation bilaterally Abdomen: soft, non-tender; bowel sounds normal; no masses,  no organomegaly Extremities: extremities normal, atraumatic, no cyanosis or edema and Homans sign is negative, no sign of DVT rt foot in bandage   Diagnostic Studies & Laboratory data:     Recent Radiology Findings:  Ct scan from [facility] reviewed ; Rt upper lobe mass with medial air bronchgrams more solid laterialy Dg Chest 2 View  06/18/2011  *RADIOLOGY REPORT*  Clinical Data: Abnormal chest x-ray  CHEST - 2 VIEW  Comparison: [facility] study 06/13/2011.  Findings: Right apical density is again noted.  This density may be slightly less pronounced than on prior study.  I would recommend continued follow up to see if this resolves further.  Malignancy still is not excluded.  Remainder of the lungs are clear.   Heart is normal size.  No effusions.  IMPRESSION: Possible slight improvement in the right apical mass-like density. Recommend continued close follow-up.  Malignancy is still not excluded.  Original Report Authenticated By: Cyndie Chime, M.D.    Recent Lab Findings: Lab Results  Component Value Date   GLUCOSE 129* 02/27/2011   CHOL 162 02/27/2011   TRIG 112.0 02/27/2011   HDL 53.20 02/27/2011   LDLCALC 86 02/27/2011   ALT 19 02/27/2011   AST 21 02/27/2011   NA 138 02/27/2011   K 3.8 02/27/2011   CL 103 02/27/2011   CREATININE 0.6 02/27/2011   BUN 16 02/27/2011   CO2 27 02/27/2011   TSH 0.01* 02/27/2011      Assessment / Plan:     Newly discovered right upper lobe lung lesion most with some infiltrative component and more solid component, possibly malignant versus infiltrative process. With the patient's syncopal episode we will obtain the MRI both for evaluation of the syncopal episode and also to rule out the possibility of brain metastasis. Patient will have a PET scan to evaluate and stage the newly diagnosed right upper lobe lung nodule but suggestive but not diagnostic of carcinoma of the lung. Following these studies we'll proceed with biopsy either percutaneous needle versus ENB, electromagnetic navigation bronchoscopy. The films have been reviewed with the patient and her daughter in detail and treatment plan reviewed.   Delight Ovens MD  Beeper [phone] Office [phone] 06/24/2011 5:19 PM

## 2011-06-24 NOTE — Telephone Encounter (Signed)
Advised patient appointment today with Dr Tyrone Sage

## 2011-06-25 LAB — CREATININE, SERUM: Creat: 0.6 mg/dL (ref 0.50–1.10)

## 2011-06-26 HISTORY — PX: LUNG LOBECTOMY: SHX167

## 2011-06-29 ENCOUNTER — Telehealth: Payer: Self-pay | Admitting: Cardiology

## 2011-06-29 NOTE — Telephone Encounter (Signed)
New problem Pt wants to talk to you about appt she has on Wed please call her back

## 2011-06-29 NOTE — Telephone Encounter (Signed)
Just saw patient as work in, rescheduled appointment.  Ok per  Dr. Patty Sermons

## 2011-07-01 ENCOUNTER — Other Ambulatory Visit: Payer: Medicare Other

## 2011-07-01 ENCOUNTER — Ambulatory Visit: Payer: Medicare Other | Admitting: Cardiology

## 2011-07-02 DIAGNOSIS — R222 Localized swelling, mass and lump, trunk: Secondary | ICD-10-CM | POA: Diagnosis not present

## 2011-07-03 ENCOUNTER — Ambulatory Visit (INDEPENDENT_AMBULATORY_CARE_PROVIDER_SITE_OTHER): Payer: Medicare Other | Admitting: Cardiothoracic Surgery

## 2011-07-03 ENCOUNTER — Encounter (HOSPITAL_COMMUNITY): Payer: Self-pay

## 2011-07-03 ENCOUNTER — Ambulatory Visit (HOSPITAL_COMMUNITY)
Admission: RE | Admit: 2011-07-03 | Discharge: 2011-07-03 | Disposition: A | Discharge status: Home / Self Care | Payer: Medicare Other | Source: Ambulatory Visit | Attending: Cardiothoracic Surgery | Admitting: Cardiothoracic Surgery

## 2011-07-03 ENCOUNTER — Encounter: Payer: Self-pay | Admitting: Cardiothoracic Surgery

## 2011-07-03 ENCOUNTER — Encounter (HOSPITAL_COMMUNITY)
Admission: RE | Admit: 2011-07-03 | Discharge: 2011-07-03 | Disposition: A | Discharge status: Home / Self Care | Payer: Medicare Other | Source: Ambulatory Visit | Attending: Cardiothoracic Surgery | Admitting: Cardiothoracic Surgery

## 2011-07-03 VITALS — BP 157/80 | HR 76 | Resp 18 | Ht 61.0 in | Wt 128.0 lb

## 2011-07-03 DIAGNOSIS — C3490 Malignant neoplasm of unspecified part of unspecified bronchus or lung: Secondary | ICD-10-CM | POA: Diagnosis not present

## 2011-07-03 DIAGNOSIS — R55 Syncope and collapse: Secondary | ICD-10-CM | POA: Diagnosis not present

## 2011-07-03 DIAGNOSIS — G319 Degenerative disease of nervous system, unspecified: Secondary | ICD-10-CM | POA: Diagnosis not present

## 2011-07-03 DIAGNOSIS — D381 Neoplasm of uncertain behavior of trachea, bronchus and lung: Secondary | ICD-10-CM | POA: Insufficient documentation

## 2011-07-03 DIAGNOSIS — R222 Localized swelling, mass and lump, trunk: Secondary | ICD-10-CM | POA: Insufficient documentation

## 2011-07-03 DIAGNOSIS — C3410 Malignant neoplasm of upper lobe, unspecified bronchus or lung: Secondary | ICD-10-CM | POA: Diagnosis not present

## 2011-07-03 DIAGNOSIS — R911 Solitary pulmonary nodule: Secondary | ICD-10-CM

## 2011-07-03 DIAGNOSIS — Z712 Person consulting for explanation of examination or test findings: Secondary | ICD-10-CM

## 2011-07-03 DIAGNOSIS — R918 Other nonspecific abnormal finding of lung field: Secondary | ICD-10-CM | POA: Diagnosis not present

## 2011-07-03 LAB — GLUCOSE, CAPILLARY: Glucose-Capillary: 99 mg/dL (ref 70–99)

## 2011-07-03 MED ORDER — FLUDEOXYGLUCOSE F - 18 (FDG) INJECTION
16.4000 | Freq: Once | INTRAVENOUS | Status: AC | PRN
  Administered 2011-07-03: 16.4 via INTRAVENOUS

## 2011-07-03 MED ORDER — GADOBENATE DIMEGLUMINE 529 MG/ML IV SOLN
11.0000 mL | Freq: Once | INTRAVENOUS | Status: AC | PRN
  Administered 2011-07-03: 11 mL via INTRAVENOUS

## 2011-07-06 ENCOUNTER — Other Ambulatory Visit: Payer: Self-pay | Admitting: Cardiothoracic Surgery

## 2011-07-06 ENCOUNTER — Encounter: Payer: Self-pay | Admitting: Cardiothoracic Surgery

## 2011-07-06 DIAGNOSIS — D381 Neoplasm of uncertain behavior of trachea, bronchus and lung: Secondary | ICD-10-CM

## 2011-07-06 NOTE — Progress Notes (Signed)
                           [address]            [place] [address]          [phone]        Gracey M Takayama [facility] Medical Record #[id number] Date of Birth: 05/16/1927  Referring: Brackbill, Thomas, MD Primary Care: Dr Brackbill  Chief Complaint:    Chief Complaint  Patient presents with  . Lung Lesion    Further discuss surgery, S/P PET SCAN/ MRI BRAIN on  07/03/11    History of Present Illness:    Patient is an 78-year-old female lifelong nonsmoker who had an chest x-ray done in [place] several  Weeks ago . She had orthopedic surgery the day prior, took an Ultram the morning after surgery and had a syncopal episode. Because of a syncopal episode she was admitted to [facility] and observed. Chest x-ray revealed opacity in the right upper lobe, further evaluation with a CT scan of the chest was performed. The patient notes that she had some upper respiratory tract infection symptoms in early January with cough several days of fever, but this had resolved the week prior to her orthopedic surgery.  She returns today after PET evaluation   Current Activity/ Functional Status: Patient is independent with mobility/ambulation, transfers, ADL's, IADL's.   Past Medical History  Diagnosis Date  . Diabetes mellitus   . Hyperlipidemia   . Hypertension   . Hypothyroidism   . Hypercholesterolemia   . Osteoarthritis   . Dyslipidemia   . Myalgia     Past Surgical History  Procedure Date  . Appendectomy   . Abdominal hysterectomy   . Cholecystectomy   . Hemorrhoid surgery     Family History  Problem Relation Age of Onset  . Arthritis Sister   . Arthritis Sister   . Diabetes Brother   . Hypertension Mother     History   Social History  . Marital Status: Married    Spouse Name: N/A    Number of Children: N/A  . Years of Education: N/A    Social History Main Topics  . Smoking status: Never Smoker   . Smokeless tobacco: Not on file  . Alcohol  Use: No  . Drug Use: No      History  Smoking status  . Never Smoker   Smokeless tobacco  . Not on file    History  Alcohol Use No     Allergies  Allergen Reactions  . Codeine   . Demerol     nausea  . Tramadol     syncope    Current Outpatient Prescriptions  Medication Sig Dispense Refill  . amitriptyline (ELAVIL) 10 MG tablet Take 10 mg by mouth at bedtime.        . bisoprolol-hydrochlorothiazide (ZIAC) 2.5-6.25 MG per tablet TAKE ONE TABLET BY MOUTH EVERY DAY  90 tablet  3  . ibuprofen (ADVIL,MOTRIN) 200 MG tablet Take 200 mg by mouth as needed.        . levothyroxine (SYNTHROID, LEVOTHROID) 100 MCG tablet Take 100 mcg by mouth every other day.        . lovastatin (MEVACOR) 20 MG tablet Take 20 mg by mouth at bedtime. Taking every other day      . metFORMIN (GLUCOPHAGE) 500 MG tablet TAKE ONE TABLET BY MOUTH TWICE DAILY  180   tablet  3  . ramipril (ALTACE) 2.5 MG capsule TAKE ONE CAPSULE BY MOUTH EVERY DAY  90 capsule  3  . tolterodine (DETROL) 2 MG tablet 2 mg by Per post-pyloric tube route Daily. Per gyne      . vitamin C (ASCORBIC ACID) 500 MG tablet Take 500 mg by mouth daily.           Review of Systems:     Cardiac Review of Systems: Y or N  Chest Pain [ n   ]  Resting SOB [ n  ] Exertional SOB  [ n ]  Orthopnea [n  ]   Pedal Edema [  n ]    Palpitations [  ] Syncope  [ y ]   Presyncope [n ]  General Review of Systems: [Y] = yes [  ]=no Constitional: recent weight change [  ]; anorexia [  ]; fatigue [  ]; nausea [  ]; night sweats [  ]; fever [  ]; or chills [  ];                                                                                                                                          Dental: poor dentition[  n];   Eye : blurred vision [  ]; diplopia [   ]; vision changes [  ];  Amaurosis fugax[  ]; Resp: cough [  ];  wheezing[  ];  hemoptysis[  ]; shortness of breath[  ]; paroxysmal nocturnal dyspnea[  ]; dyspnea on exertion[  ]; or orthopnea[   ];  GI:  gallstones[  ], vomiting[  ];  dysphagia[  ]; melena[  ];  hematochezia [  ]; heartburn[  ];   Hx of  Colonoscopy[  ]; GU: kidney stones [  ]; hematuria[  ];   dysuria [  ];  nocturia[  ];  history of     obstruction [  ];             Skin: rash, swelling[  ];, hair loss[  ];  peripheral edema[  ];  or itching[  ]; Musculosketetal: myalgias[  ];  joint swelling[  ];  joint erythema[  ];  joint pain[  ];  back pain[  ];  Heme/Lymph: bruising[  ];  bleeding[  ];  anemia[  ];  Neuro: TIA[  ];  headaches[  ];  stroke[ n ];  vertigo[y  ];  seizures[ n ];   paresthesias[  n];  difficulty walking[ n ];  Psych:depression[ n ]; anxiety[ n ];  Endocrine: diabetes[  ];  thyroid dysfunction[ n ];  Immunizations: Flu [?  ]; Pneumococcal[  ?];  Other:  Physical Exam: BP 157/80  Pulse 76  Resp 18  Ht 5' 1" (1.549 m)  Wt 128 lb (58.06 kg)  BMI 24.19 kg/m2  SpO2 98%  General appearance: alert, cooperative,   appears stated age and no distress Neurologic: intact Heart: regular rate and rhythm, S1, S2 normal, no murmur, click, rub or gallop Lungs: clear to auscultation bilaterally Abdomen: soft, non-tender; bowel sounds normal; no masses,  no organomegaly Extremities: extremities normal, atraumatic, no cyanosis or edema and Homans sign is negative, no sign of DVT rt foot in bandage   Diagnostic Studies & Laboratory data:     Recent Radiology Findings:  Dg Chest 2 View  06/18/2011  *RADIOLOGY REPORT*  Clinical Data: Abnormal chest x-ray  CHEST - 2 VIEW  Comparison: [facility] study 06/13/2011.  Findings: Right apical density is again noted.  This density may be slightly less pronounced than on prior study.  I would recommend continued follow up to see if this resolves further.  Malignancy still is not excluded.  Remainder of the lungs are clear.  Heart is normal size.  No effusions.  IMPRESSION: Possible slight improvement in the right apical mass-like density. Recommend continued close  follow-up.  Malignancy is still not excluded.  Original Report Authenticated By: KEVIN G. DOVER, M.D.   Mr Brain W Wo Contrast  07/03/2011  *RADIOLOGY REPORT*  Clinical Data: Recent diagnosis of lung cancer.  Staging.  MRI HEAD WITHOUT AND WITH CONTRAST  Technique:  Multiplanar, multiecho pulse sequences of the brain and surrounding structures were obtained according to standard protocol without and with intravenous contrast  Contrast: 11mL MULTIHANCE GADOBENATE DIMEGLUMINE 529 MG/ML IV SOLN  Comparison: 08/08/2009  Findings: The brain has a normal appearance for age.  There is mild age related atrophy without evidence of old or acute infarction, mass lesion, hemorrhage, hydrocephalus or extra-axial collection. No pituitary mass.  No skull or skull base lesion.  Sinuses are clear.  Major vascular structures are patent.  IMPRESSION: Normal examination for age.  No evidence of metastatic disease.  Original Report Authenticated By: MARK E. SHOGRY, M.D.   Nm Pet Image Initial (pi) Skull Base To Thigh  07/03/2011  *RADIOLOGY REPORT*  Clinical Data:  Initial treatment strategy for lung mass.  NUCLEAR MEDICINE PET CT INITIAL (PI) SKULL BASE TO THIGH  Technique:  16.4 mCi F-18 FDG was injected intravenously via the left antecubital.  Full-ring PET imaging was performed from the skull base through the mid-thighs 16  minutes after injection.  CT data was obtained and used for attenuation correction and anatomic localization only.  (This was not acquired as a diagnostic CT examination.)  Fasting Blood Glucose:  99  Patient Weight:  128 pounds.  Comparison:  Chest CT 06/13/2011.  Findings: The right upper lobe lung mass demonstrates low level FDG uptake with SUV max of 4.0.  This is likely neoplasm.  No FDG positive mediastinal or hilar lymph nodes.  No supraclavicular or axillary adenopathy.  No other pulmonary nodules.  No pleural effusion.  No findings for metastatic disease involving the abdomen or pelvis. There is a  benign appearing rim calcified cyst in the spleen.  No areas of osseous uptake to suggest metastatic disease.  No neck adenopathy.  The visualized portion of the brain is grossly normal.  The thyroid goiter is noted.  No abnormal FDG uptake.  IMPRESSION:  1.  Right upper lobe lung mass has neoplastic range FDG uptake but still possibly could be a chronic inflammatory process giving the adjacent bronchiectasis.  Biopsy is recommended. 2.  No mediastinal or hilar lymphadenopathy and no findings for metastatic disease.  Original Report Authenticated By: P. MARK GALLERANI, M.D.    Ct scan from [place] reviewed ;   Rt upper lobe mass with medial air bronchgrams more solid laterialy Dg Chest 2 View  06/18/2011  *RADIOLOGY REPORT*  Clinical Data: Abnormal chest x-ray  CHEST - 2 VIEW  Comparison: [facility] study 06/13/2011.  Findings: Right apical density is again noted.  This density may be slightly less pronounced than on prior study.  I would recommend continued follow up to see if this resolves further.  Malignancy still is not excluded.  Remainder of the lungs are clear.  Heart is normal size.  No effusions.  IMPRESSION: Possible slight improvement in the right apical mass-like density. Recommend continued close follow-up.  Malignancy is still not excluded.  Original Report Authenticated By: KEVIN G. DOVER, M.D.    Recent Lab Findings: Lab Results  Component Value Date   GLUCOSE 129* 02/27/2011   CHOL 162 02/27/2011   TRIG 112.0 02/27/2011   HDL 53.20 02/27/2011   LDLCALC 86 02/27/2011   ALT 19 02/27/2011   AST 21 02/27/2011   NA 138 02/27/2011   K 3.8 02/27/2011   CL 103 02/27/2011   CREATININE 0.60 06/24/2011   BUN 16 02/27/2011   CO2 27 02/27/2011   TSH 0.01* 02/27/2011      Assessment / Plan:     Newly discovered right upper lobe lung lesion most with some infiltrative component and more solid component, possibly malignant versus infiltrative process. With the patient's syncopal episode we  obtained a  MRI both for evaluation of the syncopal episode and also to rule out the possibility of brain metastasis which is negative for Metastatic disease Patient had  PET scan to evaluate and stage the newly diagnosed right upper lobe lung nodule is  suggestive but not diagnostic of carcinoma of the lung, may be inflamatory. We plan to  proceed with needle biopsy either percutaneous needle, if no  adequate results then repeat CT as super D and plan ENB The films have been reviewed with the patient and her daughter in detail and treatment plan reviewed.   Lyan Holck B Muneeb Veras MD  Beeper [phone] Office [phone] 07/06/2011 5:31 PM         

## 2011-07-07 ENCOUNTER — Other Ambulatory Visit: Payer: Self-pay | Admitting: Physician Assistant

## 2011-07-09 ENCOUNTER — Ambulatory Visit (HOSPITAL_COMMUNITY)
Admission: RE | Admit: 2011-07-09 | Discharge: 2011-07-09 | Disposition: A | Discharge status: Home / Self Care | Payer: Medicare Other | Source: Ambulatory Visit | Attending: Diagnostic Radiology | Admitting: Diagnostic Radiology

## 2011-07-09 ENCOUNTER — Inpatient Hospital Stay (HOSPITAL_COMMUNITY)
Admission: RE | Admit: 2011-07-09 | Discharge: 2011-07-13 | DRG: 200 | Disposition: A | Discharge status: Home / Self Care | Payer: Medicare Other | Source: Ambulatory Visit | Attending: Diagnostic Radiology | Admitting: Diagnostic Radiology

## 2011-07-09 ENCOUNTER — Ambulatory Visit (HOSPITAL_COMMUNITY)
Admission: RE | Admit: 2011-07-09 | Discharge: 2011-07-09 | Disposition: A | Discharge status: Home / Self Care | Payer: Medicare Other | Source: Ambulatory Visit | Attending: Cardiothoracic Surgery | Admitting: Cardiothoracic Surgery

## 2011-07-09 ENCOUNTER — Other Ambulatory Visit (HOSPITAL_COMMUNITY): Payer: Self-pay | Admitting: Diagnostic Radiology

## 2011-07-09 ENCOUNTER — Encounter (HOSPITAL_COMMUNITY): Payer: Self-pay

## 2011-07-09 VITALS — BP 129/67 | HR 61 | Temp 97.6°F | Resp 18 | Ht 61.0 in | Wt 128.0 lb

## 2011-07-09 DIAGNOSIS — E119 Type 2 diabetes mellitus without complications: Secondary | ICD-10-CM | POA: Diagnosis present

## 2011-07-09 DIAGNOSIS — C3410 Malignant neoplasm of upper lobe, unspecified bronchus or lung: Secondary | ICD-10-CM | POA: Diagnosis not present

## 2011-07-09 DIAGNOSIS — C3490 Malignant neoplasm of unspecified part of unspecified bronchus or lung: Secondary | ICD-10-CM | POA: Diagnosis not present

## 2011-07-09 DIAGNOSIS — D381 Neoplasm of uncertain behavior of trachea, bronchus and lung: Secondary | ICD-10-CM

## 2011-07-09 DIAGNOSIS — R222 Localized swelling, mass and lump, trunk: Secondary | ICD-10-CM | POA: Diagnosis not present

## 2011-07-09 DIAGNOSIS — J95811 Postprocedural pneumothorax: Secondary | ICD-10-CM

## 2011-07-09 DIAGNOSIS — J984 Other disorders of lung: Secondary | ICD-10-CM | POA: Diagnosis not present

## 2011-07-09 DIAGNOSIS — I10 Essential (primary) hypertension: Secondary | ICD-10-CM | POA: Diagnosis present

## 2011-07-09 DIAGNOSIS — Y849 Medical procedure, unspecified as the cause of abnormal reaction of the patient, or of later complication, without mention of misadventure at the time of the procedure: Secondary | ICD-10-CM | POA: Diagnosis present

## 2011-07-09 DIAGNOSIS — J9383 Other pneumothorax: Secondary | ICD-10-CM | POA: Diagnosis not present

## 2011-07-09 DIAGNOSIS — E7800 Pure hypercholesterolemia, unspecified: Secondary | ICD-10-CM | POA: Diagnosis present

## 2011-07-09 DIAGNOSIS — E039 Hypothyroidism, unspecified: Secondary | ICD-10-CM | POA: Diagnosis present

## 2011-07-09 DIAGNOSIS — J90 Pleural effusion, not elsewhere classified: Secondary | ICD-10-CM | POA: Diagnosis not present

## 2011-07-09 HISTORY — PX: LUNG BIOPSY: SHX232

## 2011-07-09 HISTORY — DX: Other specified postprocedural states: R11.2

## 2011-07-09 HISTORY — DX: Other specified postprocedural states: Z98.890

## 2011-07-09 HISTORY — DX: Postprocedural pneumothorax: J95.811

## 2011-07-09 LAB — CBC
HCT: 36.9 % (ref 36.0–46.0)
Hemoglobin: 12.3 g/dL (ref 12.0–15.0)
MCH: 28.2 pg (ref 26.0–34.0)
MCHC: 33.3 g/dL (ref 30.0–36.0)

## 2011-07-09 LAB — GLUCOSE, CAPILLARY: Glucose-Capillary: 114 mg/dL — ABNORMAL HIGH (ref 70–99)

## 2011-07-09 MED ORDER — FENTANYL CITRATE 0.05 MG/ML IJ SOLN
INTRAMUSCULAR | Status: AC
  Filled 2011-07-09: qty 6

## 2011-07-09 MED ORDER — MIDAZOLAM HCL 5 MG/5ML IJ SOLN
INTRAMUSCULAR | Status: AC | PRN
  Administered 2011-07-09 (×4): 1 mg via INTRAVENOUS

## 2011-07-09 MED ORDER — FENTANYL CITRATE 0.05 MG/ML IJ SOLN
INTRAMUSCULAR | Status: AC | PRN
  Administered 2011-07-09: 100 ug via INTRAVENOUS
  Administered 2011-07-09 (×2): 50 ug via INTRAVENOUS

## 2011-07-09 MED ORDER — MIDAZOLAM HCL 2 MG/2ML IJ SOLN
INTRAMUSCULAR | Status: AC
  Filled 2011-07-09: qty 6

## 2011-07-09 MED ORDER — SODIUM CHLORIDE 0.9 % IV SOLN: INTRAVENOUS | Status: DC

## 2011-07-09 MED ORDER — ACETAMINOPHEN 500 MG PO TABS
500.0000 mg | ORAL_TABLET | Freq: Four times a day (QID) | ORAL | Status: DC | PRN
  Administered 2011-07-10 (×2): 500 mg via ORAL
  Filled 2011-07-09: qty 1
  Filled 2011-07-09: qty 2
  Filled 2011-07-09: qty 1

## 2011-07-09 NOTE — Discharge Instructions (Signed)
Lung Biopsy A Lung Biopsy involves taking a small piece of lung tissue so it can be examined under a microscope by a pathologist to help diagnose many different lung disorders. This test can be done in a variety of ways. One way is to open the chest during surgery and remove a piece of lung tissue. It can also be done through a bronchoscope (a pencil-sized telescope) which allows the caregiver to remove a piece of tissue through your trachea (the large breathing tube to your lungs). Another procedure is a needle biopsy of the lung, which involves inserting a needle into the lung and removing a small piece of tissue. POSSIBLE COMPLICATIONS INCLUDE:  Collapse of the lung   Bleeding   Infection  PREPARATION FOR TEST No preparation or fasting is necessary. NORMAL FINDINGS No evidence of pathology. Ranges for normal findings may vary among different laboratories and hospitals. You should always check with your doctor after having lab work or other tests done to discuss the meaning of your test results and whether your values are considered within normal limits. MEANING OF TEST  Your caregiver will go over the test results with you and discuss the importance and meaning of your results, as well as treatment options and the need for additional tests if necessary. OBTAINING THE TEST RESULTS It is your responsibility to obtain your test results. Ask the lab or department performing the test when and how you will get your results. Document Released: 07/02/2004 Document Revised: 04/02/2011 Document Reviewed: 03/24/2008 [facility] Patient Information 2012 [place], [place].Biopsy A biopsy is a procedure in which small samples of tissue are removed from the body. The tissue is examined under a microscope. A biopsy may be done to determine the cause (diagnosis) of a condition or mass (tumor). A biopsy may also be done to determine the best treatment for you. In some instances, a biopsy may be performed on normal  tissue to determine if cancer has spread or if a transplanted organ is being rejected. There are 2 ways to obtain samples:  Fine needle biopsy. Samples are removed using a thin needle inserted through the skin.   Open biopsy. Samples are removed after a cut (incision) is made through the skin.  LET YOUR CAREGIVER KNOW ABOUT:  Allergies to food or medicine.   Medicines taken, including vitamins, herbs, eyedrops, over-the-counter medicines, and creams.   Use of steroids (by mouth or creams).   Previous problems with anesthetics or numbing medicines.   History of bleeding problems or blood clots.   Previous surgery.   Other health problems, including diabetes and kidney problems.   Possibility of pregnancy, if this applies.  RISKS AND COMPLICATIONS  Bleeding from the biopsy site. The risk of bleeding is higher if you have a bleeding disorder or are taking any blood thinning medicines (anticoagulants).   Infection.   Injury to organs or structures near the biopsy site.   Chronic pain at the biopsy site. This is defined as pain that lasts for more than 3 months.   Very rarely, a second biopsy may be required if not enough tissue was collected during the first biopsy.  BEFORE THE PROCEDURE Ask your caregiver what time you need to arrive for your procedure. Ask your caregiver whether you need to stop eating or drinking (fast) before your procedure. Ask your caregiver about changing or stopping your regular medicines. A blood sample may be done to determine your blood clotting time. Medicine may be given to help you relax (sedative).  PROCEDURE During a fine needle biopsy, you will be awake during the procedure. You will be positioned to allow the best possible access to the biopsy site. Let your caregiver know if the position is not comfortable. The biopsy site will be cleaned. A needle is inserted through your skin. You may feel mild discomfort during this procedure. The needle is  withdrawn once tissue samples have been removed. Pressure may be applied to the biopsy site to reduce swelling and to ensure that bleeding has stopped. The samples will be sent to be examined. During an open biopsy, you may be given medicine that numbs the area (local anesthetic) or medicine that makes you sleep (general anesthetic). An incision is made through the skin. A tissue sample or the entire mass is removed. The sample or mass will be sent to be examined. Sometimes, the sample or mass may be examined during the procedure. If the sample or mass contains cancer cells, further tissue or structures may be removed. The incision is then closed with stitches (sutures) or skin glue (adhesive). AFTER THE PROCEDURE Your recovery will be assessed and monitored. If there are no problems, you should be able to go home shortly after the procedure (outpatient). You will need to arrange for someone to drive you home if you received a sedative or pain relieving medicine during the procedure. Ask when your test results will be ready. Make sure you get your test results. Document Released: 04/10/2000 Document Revised: 04/02/2011 Document Reviewed: 10/09/2010 [facility] Patient Information 2012 [place], [place].

## 2011-07-09 NOTE — Interval H&P Note (Cosign Needed)
History and Physical Interval Note: History reviewed with patient from visit with Dr. Tyrone Sage.  No new changes in regards to ROS or PE.  Patient denies specifically chest pain, SOB, hemoptysis, weight loss or DOE. Heart examination today is RRR without murmurs, rubs or gallops, lungs are CTA bilaterally.  Airway assessment is a 2.    07/09/2011 8:45 AM  Samantha Quinn  has presented today for surgery, with the diagnosis of * right upper lobe lung lesion *  The various methods of treatment have been discussed with the patient and family. After consideration of risks, benefits and other options for treatment, the patient has consented to  * needle biopsy of right upper lung nodule * as a surgical intervention .  The patients' history has been reviewed, patient examined, no change in status, stable for surgery.  I have reviewed the patients' chart and labs.  Questions were answered to the patient's satisfaction.  The patient is aware we are trying to differentiate between a malignancy versus infection. She understands if CT today reveals significant improved changes, biopsy may not be warranted.  Daughter and daughter in law present and their questions answered as well.  Written consent obtained to proceed.    Urban Naval D

## 2011-07-09 NOTE — H&P (Signed)
Samantha Quinn is an 78 y.o. female.   Chief Complaint: Right lung biopsy and post procedure pneumothorax HPI:  Patient has a right upper lobe lesion that was biopsied earlier today.  The patient was asymptomatic during and immediately after the procedure.  However, she has a 15% right apical pneumothorax on her post biopsy chest radiographs.  She complains mild right chest pain and oxygen desaturation when she got up after approximately 3 hours of bed rest.    Past Medical History  Diagnosis Date  . Diabetes mellitus   . Hyperlipidemia   . Hypertension   . Hypothyroidism   . Hypercholesterolemia   . Osteoarthritis   . Dyslipidemia   . Myalgia     Past Surgical History  Procedure Date  . Appendectomy   . Abdominal hysterectomy   . Cholecystectomy   . Hemorrhoid surgery   . Colonoscopy     Family History  Problem Relation Age of Onset  . Arthritis Sister   . Arthritis Sister   . Diabetes Brother   . Hypertension Mother    Social History:  reports that she has never smoked. She does not have any smokeless tobacco history on file. She reports that she does not drink alcohol or use illicit drugs.  Allergies:  Allergies  Allergen Reactions  . Codeine   . Demerol     nausea  . Tramadol     syncope    Medications Prior to Admission  Medication Dose Route Frequency Provider Last Rate Last Dose  . 0.9 %  sodium chloride infusion   Intravenous Continuous Abundio Miu, MD      . fentaNYL (SUBLIMAZE) 0.05 MG/ML injection           . fentaNYL (SUBLIMAZE) injection   Intravenous PRN Abundio Miu, MD   50 mcg at 07/09/11 1031  . midazolam (VERSED) 2 MG/2ML injection           . midazolam (VERSED) 5 MG/5ML injection   Intravenous PRN Abundio Miu, MD   1 mg at 07/09/11 1031   Medications Prior to Admission  Medication Sig Dispense Refill  . amitriptyline (ELAVIL) 10 MG tablet Take 10 mg by mouth at bedtime.        . bisoprolol-hydrochlorothiazide (ZIAC) 2.5-6.25 MG per  tablet TAKE ONE TABLET BY MOUTH EVERY DAY  90 tablet  3  . ibuprofen (ADVIL,MOTRIN) 200 MG tablet Take 200 mg by mouth as needed.        [name] levothyroxine (SYNTHROID, LEVOTHROID) 100 MCG tablet Take 100 mcg by mouth every other day.        . metFORMIN (GLUCOPHAGE) 500 MG tablet TAKE ONE TABLET BY MOUTH TWICE DAILY  180 tablet  3  . ramipril (ALTACE) 2.5 MG capsule TAKE ONE CAPSULE BY MOUTH EVERY DAY  90 capsule  3  . tolterodine (DETROL) 2 MG tablet 2 mg by Per post-pyloric tube route Daily. Per gyne      . lovastatin (MEVACOR) 20 MG tablet Take 20 mg by mouth at bedtime. Taking every other day      . vitamin C (ASCORBIC ACID) 500 MG tablet Take 500 mg by mouth daily.        Results for orders placed during the hospital encounter of 07/09/11 (from the past 48 hour(s))  APTT     Status: Normal   Collection Time   07/09/11  8:15 AM      Component Value Range Comment   aPTT 27  24 -  37 (seconds)   CBC     Status: Normal   Collection Time   07/09/11  8:15 AM      Component Value Range Comment   WBC 6.4  4.0 - 10.5 (K/uL)    RBC 4.36  3.87 - 5.11 (MIL/uL)    Hemoglobin 12.3  12.0 - 15.0 (g/dL)    HCT 16.1  09.6 - 04.5 (%)    MCV 84.6  78.0 - 100.0 (fL)    MCH 28.2  26.0 - 34.0 (pg)    MCHC 33.3  30.0 - 36.0 (g/dL)    RDW 40.9  81.1 - 91.4 (%)    Platelets 287  150 - 400 (K/uL)   PROTIME-INR     Status: Normal   Collection Time   07/09/11  8:15 AM      Component Value Range Comment   Prothrombin Time 12.1  11.6 - 15.2 (seconds)    INR 0.88  0.00 - 1.49     Dg Chest 1 View  07/09/2011  *RADIOLOGY REPORT*  Clinical Data: Follow up pneumothorax, post biopsy.  CHEST - 1 VIEW  Comparison: 07/09/2011  Findings: Right apical pneumothorax again noted.  This appears slightly larger when compared to prior study although some of this could be positional.  Right upper lobe mass again noted.  Suspect small right effusion.  Mild cardiomegaly.  No confluent opacity on the left.  IMPRESSION: Stable or  slight increased size of the right apical pneumothorax.  Small right effusion.  Original Report Authenticated By: Cyndie Chime, M.D.   Dg Chest 1 View  07/09/2011  *RADIOLOGY REPORT*  Clinical Data: Status post right lung biopsy  CHEST - 1 VIEW  Comparison: CT scan performed earlier today and chest x-ray from June 18, 2011  Findings: There is a 10 - 15% right apical pneumothorax.  Right upper lobe mass is again seen.  The left lung is clear.  The cardiac silhouette, mediastinum, pulmonary vasculature are within normal limits.  IMPRESSION: Right apical pneumothorax, approximately 10 - 15%.  Original Report Authenticated By: Brandon Melnick, M.D.   Ct Biopsy  07/09/2011  *RADIOLOGY REPORT*  Clinical history:78 year old with right upper lobe lesion. Evaluate for malignancy versus infection.  PROCEDURE(S): CT GUIDED FINE NEEDLE ASPIRATION AND CORE BIOPSIES OF RIGHT UPPER LOBE LESION  Physician: Rachelle Hora. Terryon Pineiro, MD  Medications:Versed 4 mg, Fentanyl 200 mcg. A radiology nurse monitored the patient for moderate sedation.  Moderate sedation time:40 minutes  Procedure:The procedure was explained to the patient.  The risks and benefits of the procedure were discussed and the patient's questions were addressed.  Informed consent was obtained from the patient.  The patient was placed prone and images through the upper chest were obtained.  A posterior approach was not felt to be ideal because it would require traversing the superior segment of the right lower lobe.  The patient was placed in a supine position and images through the upper chest were obtained.  An approach was identified anteriorly below the right axillary vessels.  The right anterior breast tissue was prepped and draped in a sterile fashion. The skin was anesthetized with 1% lidocaine.  A 17 gauge needle was directed into the right upper lobe lesion with CT fluoroscopy. Needle placement confirmed within the lesion.  Four fine needle aspirations were  obtained.  One of the aspirations was sent for culture.  Two core biopsies obtained an with 18 gauge device.  The 17 gauge needle was removed.  Final CT  images demonstrated a moderate sized anterior pneumothorax.  The skin was prepped in the anterior mid chest.  Skin was anesthetized with 1% lidocaine.  5- [place] Yueh catheter was directed into the pleural space while aspirating.  Yueh catheter was placed.  Approximately 80 ml of air was removed.  No significant pneumothorax was identified following removal of this Yueh catheter.  The patient was placed with the right side down.  The patient was asymptomatic throughout the procedure.  Findings:Large area of consolidation with air bronchograms in the right upper lobe represents the lesion.  Needle placement was confirmed within the lesion.  The patient developed an irregular shaped pneumothorax during the procedure.  Majority of the pleural air was aspirated at the end of the case.  Complications: None  Impression:CT guided aspirations and biopsies of the  right upper lobe lesion.  The patient developed a pneumothorax during the procedure which was treated with aspiration.   The pneumothorax will be followed with chest radiography.  Original Report Authenticated By: Richarda Overlie, M.D.    Review of Systems  Constitutional: Negative.   HENT: Negative.   Cardiovascular: Negative.   Gastrointestinal: Negative.   Genitourinary: Negative.     Blood pressure 130/60, pulse 70, temperature 97 F (36.1 C), temperature source Oral, resp. rate 19, height 5\' 1"  (1.549 m), weight 128 lb (58.06 kg), SpO2 99.00%. Physical Exam  Constitutional: She appears well-developed.  Cardiovascular: Normal rate, regular rhythm and normal heart sounds.   Respiratory: Effort normal and breath sounds normal.  GI: Soft.     Assessment/Plan 78 year old with right upper lobe mass, suspicious for malignancy.  Patient has a 15% right apical pneumothorax and mild symptoms after a lung  biopsy.    Plan to observe the patient overnight and get follow up chest radiographs in the morning.  Hopefully, the pneumothorax will remain stable or decrease so that patient can go home tomorrow.  If the patient becomes more symptomatic or if the pneumothorax significantly enlarges, then she will need a chest tube and additional hospitalization.  Ota Ebersole RYAN 07/09/2011, 4:44 PM

## 2011-07-09 NOTE — H&P (View-Only) (Signed)
[address]            [place] [address]          [phone]        Samantha Quinn [facility] Medical Record #[id number] Date of Birth: 19-Oct-1927  Referring: [name], MD Primary Care: Dr Patty Sermons  Chief Complaint:    Chief Complaint  Patient presents with  . Lung Lesion    Further discuss surgery, S/P PET SCAN/ MRI BRAIN on  07/03/11    History of Present Illness:    Patient is an 78 year old female lifelong nonsmoker who had an chest x-ray done in [place] several  Weeks ago . She had orthopedic surgery the day prior, took an Ultram the morning after surgery and had a syncopal episode. Because of a syncopal episode she was admitted to [facility] and observed. Chest x-ray revealed opacity in the right upper lobe, further evaluation with a CT scan of the chest was performed. The patient notes that she had some upper respiratory tract infection symptoms in early January with cough several days of fever, but this had resolved the week prior to her orthopedic surgery.  She returns today after PET evaluation   Current Activity/ Functional Status: Patient is independent with mobility/ambulation, transfers, ADL's, IADL's.   Past Medical History  Diagnosis Date  . Diabetes mellitus   . Hyperlipidemia   . Hypertension   . Hypothyroidism   . Hypercholesterolemia   . Osteoarthritis   . Dyslipidemia   . Myalgia     Past Surgical History  Procedure Date  . Appendectomy   . Abdominal hysterectomy   . Cholecystectomy   . Hemorrhoid surgery     Family History  Problem Relation Age of Onset  . Arthritis Sister   . Arthritis Sister   . Diabetes Brother   . Hypertension Mother     History   Social History  . Marital Status: Married    Spouse Name: N/A    Number of Children: N/A  . Years of Education: N/A    Social History Main Topics  . Smoking status: Never Smoker   . Smokeless tobacco: Not on file  . Alcohol  Use: No  . Drug Use: No      History  Smoking status  . Never Smoker   Smokeless tobacco  . Not on file    History  Alcohol Use No     Allergies  Allergen Reactions  . Codeine   . Demerol     nausea  . Tramadol     syncope    Current Outpatient Prescriptions  Medication Sig Dispense Refill  . amitriptyline (ELAVIL) 10 MG tablet Take 10 mg by mouth at bedtime.        . bisoprolol-hydrochlorothiazide (ZIAC) 2.5-6.25 MG per tablet TAKE ONE TABLET BY MOUTH EVERY DAY  90 tablet  3  . ibuprofen (ADVIL,MOTRIN) 200 MG tablet Take 200 mg by mouth as needed.        [name] levothyroxine (SYNTHROID, LEVOTHROID) 100 MCG tablet Take 100 mcg by mouth every other day.        . lovastatin (MEVACOR) 20 MG tablet Take 20 mg by mouth at bedtime. Taking every other day      . metFORMIN (GLUCOPHAGE) 500 MG tablet TAKE ONE TABLET BY MOUTH TWICE DAILY  180  tablet  3  . ramipril (ALTACE) 2.5 MG capsule TAKE ONE CAPSULE BY MOUTH EVERY DAY  90 capsule  3  . tolterodine (DETROL) 2 MG tablet 2 mg by Per post-pyloric tube route Daily. Per gyne      . vitamin C (ASCORBIC ACID) 500 MG tablet Take 500 mg by mouth daily.           Review of Systems:     Cardiac Review of Systems: Y or N  Chest Pain [ n   ]  Resting SOB [ n  ] Exertional SOB  [ n ]  Orthopnea [n  ]   Pedal Edema [  n ]    Palpitations [  ] Syncope  [ y ]   Presyncope [n ]  General Review of Systems: [Y] = yes [  ]=no Constitional: recent weight change [  ]; anorexia [  ]; fatigue [  ]; nausea [  ]; night sweats [  ]; fever [  ]; or chills [  ];                                                                                                                                          Dental: poor dentition[  n];   Eye : blurred vision [  ]; diplopia [   ]; vision changes [  ];  Amaurosis fugax[  ]; Resp: cough [  ];  wheezing[  ];  hemoptysis[  ]; shortness of breath[  ]; paroxysmal nocturnal dyspnea[  ]; dyspnea on exertion[  ]; or orthopnea[   ];  GI:  gallstones[  ], vomiting[  ];  dysphagia[  ]; melena[  ];  hematochezia [  ]; heartburn[  ];   Hx of  Colonoscopy[  ]; GU: kidney stones [  ]; hematuria[  ];   dysuria [  ];  nocturia[  ];  history of     obstruction [  ];             Skin: rash, swelling[  ];, hair loss[  ];  peripheral edema[  ];  or itching[  ]; Musculosketetal: myalgias[  ];  joint swelling[  ];  joint erythema[  ];  joint pain[  ];  back pain[  ];  Heme/Lymph: bruising[  ];  bleeding[  ];  anemia[  ];  Neuro: TIA[  ];  headaches[  ];  stroke[ n ];  vertigo[y  ];  seizures[ n ];   paresthesias[  n];  difficulty walking[ n ];  Psych:depression[ n ]; anxiety[ n ];  Endocrine: diabetes[  ];  thyroid dysfunction[ n ];  Immunizations: Flu [?  ]; Pneumococcal[  ?];  Other:  Physical Exam: BP 157/80  Pulse 76  Resp 18  Ht 5\' 1"  (1.549 m)  Wt 128 lb (58.06 kg)  BMI 24.19 kg/m2  SpO2 98%  General appearance: alert, cooperative,  appears stated age and no distress Neurologic: intact Heart: regular rate and rhythm, S1, S2 normal, no murmur, click, rub or gallop Lungs: clear to auscultation bilaterally Abdomen: soft, non-tender; bowel sounds normal; no masses,  no organomegaly Extremities: extremities normal, atraumatic, no cyanosis or edema and Homans sign is negative, no sign of DVT rt foot in bandage   Diagnostic Studies & Laboratory data:     Recent Radiology Findings:  Dg Chest 2 View  06/18/2011  *RADIOLOGY REPORT*  Clinical Data: Abnormal chest x-ray  CHEST - 2 VIEW  Comparison: [facility] study 06/13/2011.  Findings: Right apical density is again noted.  This density may be slightly less pronounced than on prior study.  I would recommend continued follow up to see if this resolves further.  Malignancy still is not excluded.  Remainder of the lungs are clear.  Heart is normal size.  No effusions.  IMPRESSION: Possible slight improvement in the right apical mass-like density. Recommend continued close  follow-up.  Malignancy is still not excluded.  Original Report Authenticated By: Cyndie Chime, M.D.   Mr Laqueta Jean Wo Contrast  07/03/2011  *RADIOLOGY REPORT*  Clinical Data: Recent diagnosis of lung cancer.  Staging.  MRI HEAD WITHOUT AND WITH CONTRAST  Technique:  Multiplanar, multiecho pulse sequences of the brain and surrounding structures were obtained according to standard protocol without and with intravenous contrast  Contrast: 11mL MULTIHANCE GADOBENATE DIMEGLUMINE 529 MG/ML IV SOLN  Comparison: 08/08/2009  Findings: The brain has a normal appearance for age.  There is mild age related atrophy without evidence of old or acute infarction, mass lesion, hemorrhage, hydrocephalus or extra-axial collection. No pituitary mass.  No skull or skull base lesion.  Sinuses are clear.  Major vascular structures are patent.  IMPRESSION: Normal examination for age.  No evidence of metastatic disease.  Original Report Authenticated By: Thomasenia Sales, M.D.   Nm Pet Image Initial (pi) Skull Base To Thigh  07/03/2011  *RADIOLOGY REPORT*  Clinical Data:  Initial treatment strategy for lung mass.  NUCLEAR MEDICINE PET CT INITIAL (PI) SKULL BASE TO THIGH  Technique:  16.4 mCi F-18 FDG was injected intravenously via the left antecubital.  Full-ring PET imaging was performed from the skull base through the mid-thighs 16  minutes after injection.  CT data was obtained and used for attenuation correction and anatomic localization only.  (This was not acquired as a diagnostic CT examination.)  Fasting Blood Glucose:  99  Patient Weight:  128 pounds.  Comparison:  Chest CT 06/13/2011.  Findings: The right upper lobe lung mass demonstrates low level FDG uptake with SUV max of 4.0.  This is likely neoplasm.  No FDG positive mediastinal or hilar lymph nodes.  No supraclavicular or axillary adenopathy.  No other pulmonary nodules.  No pleural effusion.  No findings for metastatic disease involving the abdomen or pelvis. There is a  benign appearing rim calcified cyst in the spleen.  No areas of osseous uptake to suggest metastatic disease.  No neck adenopathy.  The visualized portion of the brain is grossly normal.  The thyroid goiter is noted.  No abnormal FDG uptake.  IMPRESSION:  1.  Right upper lobe lung mass has neoplastic range FDG uptake but still possibly could be a chronic inflammatory process giving the adjacent bronchiectasis.  Biopsy is recommended. 2.  No mediastinal or hilar lymphadenopathy and no findings for metastatic disease.  Original Report Authenticated By: P. Loralie Champagne, M.D.    Ct scan from [facility] reviewed ;  Rt upper lobe mass with medial air bronchgrams more solid laterialy Dg Chest 2 View  06/18/2011  *RADIOLOGY REPORT*  Clinical Data: Abnormal chest x-ray  CHEST - 2 VIEW  Comparison: [facility] study 06/13/2011.  Findings: Right apical density is again noted.  This density may be slightly less pronounced than on prior study.  I would recommend continued follow up to see if this resolves further.  Malignancy still is not excluded.  Remainder of the lungs are clear.  Heart is normal size.  No effusions.  IMPRESSION: Possible slight improvement in the right apical mass-like density. Recommend continued close follow-up.  Malignancy is still not excluded.  Original Report Authenticated By: Cyndie Chime, M.D.    Recent Lab Findings: Lab Results  Component Value Date   GLUCOSE 129* 02/27/2011   CHOL 162 02/27/2011   TRIG 112.0 02/27/2011   HDL 53.20 02/27/2011   LDLCALC 86 02/27/2011   ALT 19 02/27/2011   AST 21 02/27/2011   NA 138 02/27/2011   K 3.8 02/27/2011   CL 103 02/27/2011   CREATININE 0.60 06/24/2011   BUN 16 02/27/2011   CO2 27 02/27/2011   TSH 0.01* 02/27/2011      Assessment / Plan:     Newly discovered right upper lobe lung lesion most with some infiltrative component and more solid component, possibly malignant versus infiltrative process. With the patient's syncopal episode we  obtained a  MRI both for evaluation of the syncopal episode and also to rule out the possibility of brain metastasis which is negative for Metastatic disease Patient had  PET scan to evaluate and stage the newly diagnosed right upper lobe lung nodule is  suggestive but not diagnostic of carcinoma of the lung, may be inflamatory. We plan to  proceed with needle biopsy either percutaneous needle, if no  adequate results then repeat CT as super D and plan ENB The films have been reviewed with the patient and her daughter in detail and treatment plan reviewed.   Delight Ovens MD  Beeper [phone] Office [phone] 07/06/2011 5:31 PM

## 2011-07-09 NOTE — Progress Notes (Signed)
Pt arrived to unit post procedure c rt side down, instructed per md to remain in this position until further notice  Verbalizes ubderstanding

## 2011-07-09 NOTE — Procedures (Signed)
CT guided FNAs and core biopsies of right upper lobe lesion.  4 FNAs and 2 core biopsies.  Small anterior pneumothorax treated with aspiration.  Recover in short stay and get follow up CXRs.

## 2011-07-09 NOTE — Progress Notes (Signed)
Dr Lowella Dandy here  Pt ambulated to br per md request. Became short of breath, and experienced pain at biopsy site. Back to bed, sats down to 80 percent   Then up to 99-100 at rest.  md here  Discussed c pt and family  Pt is to be admitted to  Observation  Bed requested

## 2011-07-10 ENCOUNTER — Inpatient Hospital Stay (HOSPITAL_COMMUNITY): Payer: Medicare Other

## 2011-07-10 ENCOUNTER — Observation Stay (HOSPITAL_COMMUNITY): Payer: Medicare Other

## 2011-07-10 DIAGNOSIS — Z09 Encounter for follow-up examination after completed treatment for conditions other than malignant neoplasm: Secondary | ICD-10-CM | POA: Diagnosis not present

## 2011-07-10 DIAGNOSIS — J95811 Postprocedural pneumothorax: Secondary | ICD-10-CM | POA: Diagnosis not present

## 2011-07-10 DIAGNOSIS — E039 Hypothyroidism, unspecified: Secondary | ICD-10-CM | POA: Diagnosis present

## 2011-07-10 DIAGNOSIS — J9311 Primary spontaneous pneumothorax: Secondary | ICD-10-CM | POA: Diagnosis not present

## 2011-07-10 DIAGNOSIS — E119 Type 2 diabetes mellitus without complications: Secondary | ICD-10-CM | POA: Diagnosis present

## 2011-07-10 DIAGNOSIS — R222 Localized swelling, mass and lump, trunk: Secondary | ICD-10-CM | POA: Diagnosis not present

## 2011-07-10 DIAGNOSIS — J9383 Other pneumothorax: Secondary | ICD-10-CM | POA: Diagnosis not present

## 2011-07-10 DIAGNOSIS — C3410 Malignant neoplasm of upper lobe, unspecified bronchus or lung: Secondary | ICD-10-CM | POA: Diagnosis present

## 2011-07-10 DIAGNOSIS — E7800 Pure hypercholesterolemia, unspecified: Secondary | ICD-10-CM | POA: Diagnosis present

## 2011-07-10 DIAGNOSIS — I10 Essential (primary) hypertension: Secondary | ICD-10-CM | POA: Diagnosis present

## 2011-07-10 HISTORY — PX: OTHER SURGICAL HISTORY: SHX169

## 2011-07-10 LAB — GLUCOSE, CAPILLARY: Glucose-Capillary: 91 mg/dL (ref 70–99)

## 2011-07-10 MED ORDER — SIMVASTATIN 20 MG PO TABS
20.0000 mg | ORAL_TABLET | Freq: Every day | ORAL | Status: DC
  Administered 2011-07-10 – 2011-07-12 (×3): 20 mg via ORAL
  Filled 2011-07-10 (×4): qty 1

## 2011-07-10 MED ORDER — BISOPROLOL-HYDROCHLOROTHIAZIDE 2.5-6.25 MG PO TABS
1.0000 | ORAL_TABLET | Freq: Once | ORAL | Status: AC
  Administered 2011-07-10: 1 via ORAL
  Filled 2011-07-10: qty 1

## 2011-07-10 MED ORDER — MIDAZOLAM HCL 2 MG/2ML IJ SOLN
INTRAMUSCULAR | Status: AC
  Filled 2011-07-10: qty 6

## 2011-07-10 MED ORDER — MIDAZOLAM HCL 5 MG/5ML IJ SOLN
INTRAMUSCULAR | Status: AC | PRN
  Administered 2011-07-10: 1 mg via INTRAVENOUS

## 2011-07-10 MED ORDER — VITAMIN C 500 MG PO TABS
500.0000 mg | ORAL_TABLET | Freq: Every day | ORAL | Status: DC
  Administered 2011-07-11 – 2011-07-13 (×3): 500 mg via ORAL
  Filled 2011-07-10 (×4): qty 1

## 2011-07-10 MED ORDER — FENTANYL CITRATE 0.05 MG/ML IJ SOLN
INTRAMUSCULAR | Status: AC
  Filled 2011-07-10: qty 6

## 2011-07-10 MED ORDER — AMITRIPTYLINE HCL 10 MG PO TABS
5.0000 mg | ORAL_TABLET | Freq: Every day | ORAL | Status: DC
  Administered 2011-07-10 – 2011-07-12 (×3): 5 mg via ORAL
  Filled 2011-07-10 (×5): qty 0.5

## 2011-07-10 MED ORDER — LEVOTHYROXINE SODIUM 100 MCG PO TABS
100.0000 ug | ORAL_TABLET | ORAL | Status: DC
  Administered 2011-07-10 – 2011-07-12 (×2): 100 ug via ORAL
  Filled 2011-07-10 (×2): qty 1

## 2011-07-10 MED ORDER — RAMIPRIL 2.5 MG PO CAPS
2.5000 mg | ORAL_CAPSULE | ORAL | Status: DC
  Administered 2011-07-10 – 2011-07-12 (×3): 2.5 mg via ORAL
  Filled 2011-07-10 (×4): qty 1

## 2011-07-10 MED ORDER — FENTANYL CITRATE 0.05 MG/ML IJ SOLN
INTRAMUSCULAR | Status: AC | PRN
  Administered 2011-07-10 (×2): 50 ug via INTRAVENOUS

## 2011-07-10 MED ORDER — LIDOCAINE HCL 1 % IJ SOLN
INTRAMUSCULAR | Status: AC
  Filled 2011-07-10: qty 20

## 2011-07-10 MED ORDER — METFORMIN HCL 500 MG PO TABS
500.0000 mg | ORAL_TABLET | Freq: Two times a day (BID) | ORAL | Status: DC
  Administered 2011-07-10 – 2011-07-13 (×6): 500 mg via ORAL
  Filled 2011-07-10 (×7): qty 1

## 2011-07-10 MED ORDER — ACETAMINOPHEN 500 MG PO TABS
1000.0000 mg | ORAL_TABLET | ORAL | Status: DC | PRN
  Administered 2011-07-10 – 2011-07-13 (×7): 1000 mg via ORAL
  Filled 2011-07-10 (×7): qty 2

## 2011-07-10 NOTE — Progress Notes (Signed)
Patient ID: Samantha Quinn, female   DOB: 1927/07/21, 78 y.o.   MRN: [id number] Patient stable; has some mild right upper chest discomfort at chest tube site; VSS AF  O2 SATS 96%RA;right chest tube intact, no air leak, tube to wall suction; f/u CXR reveals near complete resolution of right pneumothorax. Check f/u CXR in am; path pending.

## 2011-07-10 NOTE — Procedures (Signed)
Procedure:  Right tube thoracostomy with imaging guidance Indication:  Enlarging right PTX after lung biopsy yesterday Findings:  12 Fr pigtail chest tube placed under fluoro guidance from a right midaxillary approach with pigtail portion positioned near apex.   Plan:  Tube to Sahara at -20cm H2O wall suction.  Keep on 2L oxygen by nasal canula.  Repeat CXR later today and in AM.

## 2011-07-10 NOTE — Progress Notes (Signed)
Subjective: Patient with some right chest soreness; no increased dyspnea or respiratory distress  Objective: Vital signs in last 24 hours: Temp:  [97 F (36.1 C)-98.2 F (36.8 C)] 98.2 F (36.8 C) (03/15 0551) Pulse Rate:  [63-80] 68  (03/15 0551) Resp:  [12-19] 18  (03/15 0551) BP: (99-149)/(50-81) 129/68 mmHg (03/15 0551) SpO2:  [96 %-100 %] 96 % (03/15 0852) FiO2 (%):  [2 %] 2 % (03/14 2221) Weight:  [128 lb (58.06 kg)] 128 lb (58.06 kg) (03/14 1730) Last BM Date: 07/08/11  Intake/Output from previous day: 03/14 0701 - 03/15 0700 In: 780 [P.O.:60; I.V.:720] Out: 4 [Urine:4] Intake/Output this shift:   Pt awake/alert, chest with decreased BS right upper fields, clear on left; card- RRR; abd- soft, NT/ND, +BS; ext- FROM, no edema  Lab Results:   [facility] 07/09/11 0815  WBC 6.4  HGB 12.3  HCT 36.9  PLT 287   BMET No results found for this basename: NA:2,K:2,CL:2,CO2:2,GLUCOSE:2,BUN:2,CREATININE:2,CALCIUM:2 in the last 72 hours PT/INR  Basename 07/09/11 0815  LABPROT 12.1  INR 0.88   ABG No results found for this basename: PHART:2,PCO2:2,PO2:2,HCO3:2 in the last 72 hours  Studies/Results: Dg Chest 1 View  07/10/2011  *RADIOLOGY REPORT*  Clinical Data: Right-sided pneumothorax after lung biopsy.  CHEST - 1 VIEW  Comparison: 07/09/2011 at 1445 hours  Findings: There is increase in size of the right pneumothorax to roughly 30 - 35%.  Underlying lung mass again evident.  No associated pleural effusion or edema.  No visible shift of the mediastinum to imply component of tension.  IMPRESSION: Increase in size of right pneumothorax to roughly 30 - 35%.  Original Report Authenticated By: Reola Calkins, M.D.   Dg Chest 1 View  07/09/2011  *RADIOLOGY REPORT*  Clinical Data: Follow up pneumothorax, post biopsy.  CHEST - 1 VIEW  Comparison: 07/09/2011  Findings: Right apical pneumothorax again noted.  This appears slightly larger when compared to prior study although some  of this could be positional.  Right upper lobe mass again noted.  Suspect small right effusion.  Mild cardiomegaly.  No confluent opacity on the left.  IMPRESSION: Stable or slight increased size of the right apical pneumothorax.  Small right effusion.  Original Report Authenticated By: Cyndie Chime, M.D.   Dg Chest 1 View  07/09/2011  *RADIOLOGY REPORT*  Clinical Data: Status post right lung biopsy  CHEST - 1 VIEW  Comparison: CT scan performed earlier today and chest x-ray from June 18, 2011  Findings: There is a 10 - 15% right apical pneumothorax.  Right upper lobe mass is again seen.  The left lung is clear.  The cardiac silhouette, mediastinum, pulmonary vasculature are within normal limits.  IMPRESSION: Right apical pneumothorax, approximately 10 - 15%.  Original Report Authenticated By: Brandon Melnick, M.D.   Ct Biopsy  07/09/2011  *RADIOLOGY REPORT*  Clinical history:78 year old with right upper lobe lesion. Evaluate for malignancy versus infection.  PROCEDURE(S): CT GUIDED FINE NEEDLE ASPIRATION AND CORE BIOPSIES OF RIGHT UPPER LOBE LESION  Physician: Rachelle Hora. Henn, MD  Medications:Versed 4 mg, Fentanyl 200 mcg. A radiology nurse monitored the patient for moderate sedation.  Moderate sedation time:40 minutes  Procedure:The procedure was explained to the patient.  The risks and benefits of the procedure were discussed and the patient's questions were addressed.  Informed consent was obtained from the patient.  The patient was placed prone and images through the upper chest were obtained.  A posterior approach was not felt to be ideal  because it would require traversing the superior segment of the right lower lobe.  The patient was placed in a supine position and images through the upper chest were obtained.  An approach was identified anteriorly below the right axillary vessels.  The right anterior breast tissue was prepped and draped in a sterile fashion. The skin was anesthetized with 1%  lidocaine.  A 17 gauge needle was directed into the right upper lobe lesion with CT fluoroscopy. Needle placement confirmed within the lesion.  Four fine needle aspirations were obtained.  One of the aspirations was sent for culture.  Two core biopsies obtained an with 18 gauge device.  The 17 gauge needle was removed.  Final CT images demonstrated a moderate sized anterior pneumothorax.  The skin was prepped in the anterior mid chest.  Skin was anesthetized with 1% lidocaine.  5- [place] Yueh catheter was directed into the pleural space while aspirating.  Yueh catheter was placed.  Approximately 80 ml of air was removed.  No significant pneumothorax was identified following removal of this Yueh catheter.  The patient was placed with the right side down.  The patient was asymptomatic throughout the procedure.  Findings:Large area of consolidation with air bronchograms in the right upper lobe represents the lesion.  Needle placement was confirmed within the lesion.  The patient developed an irregular shaped pneumothorax during the procedure.  Majority of the pleural air was aspirated at the end of the case.  Complications: None  Impression:CT guided aspirations and biopsies of the  right upper lobe lesion.  The patient developed a pneumothorax during the procedure which was treated with aspiration.   The pneumothorax will be followed with chest radiography.  Original Report Authenticated By: Richarda Overlie, M.D.    Anti-infectives: Anti-infectives    None      Assessment/Plan: S/p right lung mass biopsy 3/14 with subsequent asymptomatic 10-15% pneumothorax, now with enlarging pneumothorax to 30-35% and chest discomfort; plan is for right chest tube placement today. Details/risks of procedure discussed with patient/daughter with their understanding and consent. Dr. Dennie Maizes office notified of above. Pathology pending.   LOS: 1 day    Justin Meisenheimer,D [facility] 07/10/2011

## 2011-07-11 ENCOUNTER — Inpatient Hospital Stay (HOSPITAL_COMMUNITY): Payer: Medicare Other

## 2011-07-11 DIAGNOSIS — R222 Localized swelling, mass and lump, trunk: Secondary | ICD-10-CM | POA: Diagnosis not present

## 2011-07-11 DIAGNOSIS — J9311 Primary spontaneous pneumothorax: Secondary | ICD-10-CM | POA: Diagnosis not present

## 2011-07-11 DIAGNOSIS — J95811 Postprocedural pneumothorax: Secondary | ICD-10-CM | POA: Diagnosis not present

## 2011-07-11 NOTE — Progress Notes (Signed)
Subjective: Doing very well. No significant pain. Tolerating chest tube well.   Objective: Vital signs in last 24 hours: Temp:  [97.4 F (36.3 C)-98.1 F (36.7 C)] 97.4 F (36.3 C) (03/16 0700) Pulse Rate:  [62-76] 62  (03/16 0700) Resp:  [18-20] 18  (03/16 0700) BP: (117-132)/(62-72) 127/70 mmHg (03/16 0700) SpO2:  [96 %-100 %] 96 % (03/16 0700) Last BM Date: 07/08/11  Intake/Output from previous day: 03/15 0701 - 03/16 0700 In: 240 [P.O.:240] Out: 1 [Urine:1]  Physical examination : Eating lunch. Alert. Minimal pain.  Chest tube intact - no leak. Still on suction. 20 ml drainage in pleur vac. Resp: CTA bilaterally with slightly diminished BS on right CV: RRR, no murmurs rubs or gallops Abd: soft, non-distended and positive bowel sounds.    Lab Results:   [facility] 07/09/11 0815  WBC 6.4  HGB 12.3  HCT 36.9  PLT 287    PT/INR  Basename 07/09/11 0815  LABPROT 12.1  INR 0.88    Studies/Results: Dg Chest 1 View  07/11/2011  *RADIOLOGY REPORT*  Clinical Data: Right pneumothorax post lung biopsy, right upper lobe mass  CHEST - 1 VIEW  Comparison: 07/10/2011 and and several prior exams  Findings: Stable right pigtail chest drain position.  No current or enlarging pneumothorax.  Stable right upper lobe mass.  Lung aeration otherwise unchanged.  Mild cardiac enlargement.  IMPRESSION: Stable right pigtail chest drain.  No current pneumothorax  Original Report Authenticated By: Judie Petit. Ruel Favors, M.D.   Dg Chest 1 View  07/10/2011  *RADIOLOGY REPORT*  Clinical Data: Right-sided pneumothorax after lung biopsy.  CHEST - 1 VIEW  Comparison: 07/09/2011 at 1445 hours  Findings: There is increase in size of the right pneumothorax to roughly 30 - 35%.  Underlying lung mass again evident.  No associated pleural effusion or edema.  No visible shift of the mediastinum to imply component of tension.  IMPRESSION: Increase in size of right pneumothorax to roughly 30 - 35%.  Original Report  Authenticated By: Reola Calkins, M.D.   Dg Chest 1 View  07/09/2011  *RADIOLOGY REPORT*  Clinical Data: Follow up pneumothorax, post biopsy.  CHEST - 1 VIEW  Comparison: 07/09/2011  Findings: Right apical pneumothorax again noted.  This appears slightly larger when compared to prior study although some of this could be positional.  Right upper lobe mass again noted.  Suspect small right effusion.  Mild cardiomegaly.  No confluent opacity on the left.  IMPRESSION: Stable or slight increased size of the right apical pneumothorax.  Small right effusion.  Original Report Authenticated By: Cyndie Chime, M.D.   Dg Chest Port 1 View  07/10/2011  *RADIOLOGY REPORT*  Clinical Data: Right pneumothorax, status post chest tube.  PORTABLE CHEST - 1 VIEW  Comparison: 07/10/2011  Findings: Interval placement of right chest pigtail catheter in the right pleural space.  Near complete resolution of the right pneumothorax, now tiny.  Right upper lobe mass again noted.  COPD changes.  Heart is borderline in size.  IMPRESSION: Near complete resolution of the right pneumothorax following pleural pigtail catheter placement.  Original Report Authenticated By: Cyndie Chime, M.D.   Ir Perc Pleural Drain W/indwell Cath W/img Guide  07/10/2011  *RADIOLOGY REPORT*  Clinical Data: Enlarging right-sided pneumothorax after right upper lobe lung mass biopsy.  RIGHT CHEST TUBE THORACOSTOMY PLACEMENT UNDER FLUOROSCOPY GUIDANCE  Comparison:  Chest x-ray earlier today at 0800 hours  Sedation: 1.0 mg IV Versed; 100 mcg IV Fentanyl.  Total  Moderate Sedation Time: 20 minutes.  Fluoroscopy Time: 0.8 minutes.  Procedure:  The procedure, risks, benefits, and alternatives were explained to the patient.  Questions regarding the procedure were encouraged and answered.  The patient understands and consents to the procedure.  The right lateral chest wall was prepped with Betadine in a sterile fashion, and a sterile drape was applied covering the  operative field.  A sterile gown and sterile gloves were used for the procedure. Local anesthesia was provided with 1% Lidocaine.  The patient was placed in a decubitus position with the right side up.  From a midaxillary approach, a 19 gauge needle was advanced through a lower intercostal space into the pleural space.  After return of air with aspiration, a guidewire was advanced into the pleural space.  The tract was dilated over a wire and a 12-French pigtail drainage catheter advanced under fluoroscopy.  Catheter position was confirmed with a fluoroscopic spot image.  The catheter was connected to a Sahara Pleur-Evac device to -20 cm of water suction.  The catheter was secured at the skin with a Prolene retention suture and overlying Vasoline gauze dressing.  Complications: None  Findings: The 12-French pigtail catheter was advanced in the lateral pleural space with the pigtail portion near the apex. There was visible re-expansion of the lung after connecting the tube to a Pleur-Evac and wall suction.  A chest x-ray will be obtained immediately after the procedure.  IMPRESSION: Right chest tube thoracostomy placement under fluoroscopic guidance.  A 12-French catheter was placed.  This will be left to continous wall suction at a pressure of -20 cm of water.  Original Report Authenticated By: Reola Calkins, M.D.    Anti-infectives: Anti-infectives    None      Assessment/Plan:  1) right lung mass s/p needle biopsy with enlarging ptx s/p chest tube placement.  Doing well. To continue on wall suction today - if am CXR good - will d/c wall suction. Pathology pending.    LOS: 2 days    Trinidi Toppins D 07/11/2011

## 2011-07-11 NOTE — Progress Notes (Signed)
Noted new orders of today for changing IV site.  Current site started 07-09-11.  Per protocol, IV site changed every 96 hours. Current site WNL.

## 2011-07-12 ENCOUNTER — Inpatient Hospital Stay (HOSPITAL_COMMUNITY): Payer: Medicare Other

## 2011-07-12 DIAGNOSIS — J9383 Other pneumothorax: Secondary | ICD-10-CM | POA: Diagnosis not present

## 2011-07-12 DIAGNOSIS — J9311 Primary spontaneous pneumothorax: Secondary | ICD-10-CM | POA: Diagnosis not present

## 2011-07-12 DIAGNOSIS — Z09 Encounter for follow-up examination after completed treatment for conditions other than malignant neoplasm: Secondary | ICD-10-CM | POA: Diagnosis not present

## 2011-07-12 DIAGNOSIS — R222 Localized swelling, mass and lump, trunk: Secondary | ICD-10-CM | POA: Diagnosis not present

## 2011-07-12 NOTE — Progress Notes (Signed)
Agree with plan for h2o seal today.  Follow up cxr in am

## 2011-07-12 NOTE — Progress Notes (Signed)
Subjective: Feeling well this am. Slept good. Denies any chest symptoms - except mild soreness at Chest tube side   Objective: Vital signs in last 24 hours: Temp:  [97.6 F (36.4 C)-98.4 F (36.9 C)] 97.6 F (36.4 C) (03/17 0543) Pulse Rate:  [62-77] 62  (03/17 0543) Resp:  [16] 16  (03/17 0543) BP: (114-132)/(67-69) 131/69 mmHg (03/17 0543) SpO2:  [97 %-100 %] 100 % (03/17 0543) Last BM Date: 07/11/11  Intake/Output from previous day: 03/16 0701 - 03/17 0700 In: 240 [P.O.:240] Out: 1 [Urine:1]  Physical exam :  Awake and alert. Chest tube intact with any evidence of leak - approximately 30 ml blood tinged serous fluid in  Pleur vac.  Lungs on examination are CTA bilaterally,  CV: RRR no murmurs, rubs or gallops.  Abdomen : soft, non-distended.  LE: no edema.     Studies/Results: Dg Chest 1 View  07/12/2011  *RADIOLOGY REPORT*  Clinical Data: Right upper lobe mass biopsy, pneumothorax, chest tube  CHEST - 1 VIEW  Comparison: 07/11/2011  Findings: No significant pneumothorax.  Stable exam.  Right chest tube remains.  Right upper lobe mass again evident.  Stable cardiomegaly with vascular congestion.  No new edema or consolidation.  IMPRESSION: Stable exam.  No significant pneumothorax  Original Report Authenticated By: Judie Petit. Ruel Favors, M.D.   Dg Chest 1 View  07/11/2011  *RADIOLOGY REPORT*  Clinical Data: Right pneumothorax post lung biopsy, right upper lobe mass  CHEST - 1 VIEW  Comparison: 07/10/2011 and and several prior exams  Findings: Stable right pigtail chest drain position.  No current or enlarging pneumothorax.  Stable right upper lobe mass.  Lung aeration otherwise unchanged.  Mild cardiac enlargement.  IMPRESSION: Stable right pigtail chest drain.  No current pneumothorax  Original Report Authenticated By: Judie Petit. Ruel Favors, M.D.   Dg Chest Port 1 View  07/10/2011  *RADIOLOGY REPORT*  Clinical Data: Right pneumothorax, status post chest tube.  PORTABLE CHEST - 1 VIEW   Comparison: 07/10/2011  Findings: Interval placement of right chest pigtail catheter in the right pleural space.  Near complete resolution of the right pneumothorax, now tiny.  Right upper lobe mass again noted.  COPD changes.  Heart is borderline in size.  IMPRESSION: Near complete resolution of the right pneumothorax following pleural pigtail catheter placement.  Original Report Authenticated By: Cyndie Chime, M.D.   Ir Perc Pleural Drain W/indwell Cath W/img Guide  07/10/2011  *RADIOLOGY REPORT*  Clinical Data: Enlarging right-sided pneumothorax after right upper lobe lung mass biopsy.  RIGHT CHEST TUBE THORACOSTOMY PLACEMENT UNDER FLUOROSCOPY GUIDANCE  Comparison:  Chest x-ray earlier today at 0800 hours  Sedation: 1.0 mg IV Versed; 100 mcg IV Fentanyl.  Total Moderate Sedation Time: 20 minutes.  Fluoroscopy Time: 0.8 minutes.  Procedure:  The procedure, risks, benefits, and alternatives were explained to the patient.  Questions regarding the procedure were encouraged and answered.  The patient understands and consents to the procedure.  The right lateral chest wall was prepped with Betadine in a sterile fashion, and a sterile drape was applied covering the operative field.  A sterile gown and sterile gloves were used for the procedure. Local anesthesia was provided with 1% Lidocaine.  The patient was placed in a decubitus position with the right side up.  From a midaxillary approach, a 19 gauge needle was advanced through a lower intercostal space into the pleural space.  After return of air with aspiration, a guidewire was advanced into the pleural space.  The tract was dilated over a wire and a 12-French pigtail drainage catheter advanced under fluoroscopy.  Catheter position was confirmed with a fluoroscopic spot image.  The catheter was connected to a [employer] Pleur-Evac device to -20 cm of water suction.  The catheter was secured at the skin with a Prolene retention suture and overlying Vasoline gauze  dressing.  Complications: None  Findings: The 12-French pigtail catheter was advanced in the lateral pleural space with the pigtail portion near the apex. There was visible re-expansion of the lung after connecting the tube to a Pleur-Evac and wall suction.  A chest x-ray will be obtained immediately after the procedure.  IMPRESSION: Right chest tube thoracostomy placement under fluoroscopic guidance.  A 12-French catheter was placed.  This will be left to continous wall suction at a pressure of -20 cm of water.  Original Report Authenticated By: Reola Calkins, M.D.    Anti-infectives: Anti-infectives    None      Assessment/Plan:  Post chest tube for delayed pneumothorax following lung biopsy.  On water seal all day today - if does well and am cxr clear - will discontinue chest tube and d/c to home.    LOS: 3 days    Ante Arredondo D 07/12/2011

## 2011-07-13 ENCOUNTER — Inpatient Hospital Stay (HOSPITAL_COMMUNITY): Payer: Medicare Other

## 2011-07-13 ENCOUNTER — Encounter: Payer: Self-pay | Admitting: Cardiothoracic Surgery

## 2011-07-13 ENCOUNTER — Ambulatory Visit (INDEPENDENT_AMBULATORY_CARE_PROVIDER_SITE_OTHER): Payer: Medicare Other | Admitting: Cardiothoracic Surgery

## 2011-07-13 ENCOUNTER — Other Ambulatory Visit (HOSPITAL_COMMUNITY): Payer: Medicare Other

## 2011-07-13 VITALS — BP 142/81 | HR 85 | Resp 16 | Ht 61.0 in | Wt 132.0 lb

## 2011-07-13 DIAGNOSIS — C3410 Malignant neoplasm of upper lobe, unspecified bronchus or lung: Secondary | ICD-10-CM

## 2011-07-13 DIAGNOSIS — J9383 Other pneumothorax: Secondary | ICD-10-CM | POA: Diagnosis not present

## 2011-07-13 DIAGNOSIS — R222 Localized swelling, mass and lump, trunk: Secondary | ICD-10-CM | POA: Diagnosis not present

## 2011-07-13 DIAGNOSIS — J9311 Primary spontaneous pneumothorax: Secondary | ICD-10-CM | POA: Diagnosis not present

## 2011-07-13 DIAGNOSIS — Z09 Encounter for follow-up examination after completed treatment for conditions other than malignant neoplasm: Secondary | ICD-10-CM | POA: Diagnosis not present

## 2011-07-13 HISTORY — PX: OTHER SURGICAL HISTORY: SHX169

## 2011-07-13 LAB — BODY FLUID CULTURE

## 2011-07-13 NOTE — Progress Notes (Signed)
  Subjective: Patient doing well; no new c/o  Objective: Vital signs in last 24 hours: Temp:  [97.6 F (36.4 C)-98.8 F (37.1 C)] 97.6 F (36.4 C) (03/18 0624) Pulse Rate:  [61-85] 61  (03/18 0624) Resp:  [18] 18  (03/18 0624) BP: (124-132)/(67-73) 129/67 mmHg (03/18 0624) SpO2:  [98 %-99 %] 99 % (03/18 0624) Last BM Date: 07/12/11  Intake/Output from previous day: 03/17 0701 - 03/18 0700 In: 120 [P.O.:120] Out: 750 [Urine:750] Intake/Output this shift:    Pt awake/alert, chest- CTA bilat, heart-RRR, abd- soft,NT/ND, positive BS, ext- FROM , no edema;  Right chest tube intact, no air leak; right lung mass path - adenocarcinoma  Lab Results:  No results found for this basename: WBC:2,HGB:2,HCT:2,PLT:2 in the last 72 hours BMET No results found for this basename: NA:2,K:2,CL:2,CO2:2,GLUCOSE:2,BUN:2,CREATININE:2,CALCIUM:2 in the last 72 hours PT/INR No results found for this basename: LABPROT:2,INR:2 in the last 72 hours ABG No results found for this basename: PHART:2,PCO2:2,PO2:2,HCO3:2 in the last 72 hours  Studies/Results: Dg Chest 1 View  07/13/2011  *RADIOLOGY REPORT*  Clinical Data: Pneumothorax  CHEST - 1 VIEW  Comparison: Yesterday  Findings: Stable right chest tube.  No pneumothorax.  Stable right upper lobe mass.  IMPRESSION: No pneumothorax.  Stable.  Original Report Authenticated By: Donavan Burnet, M.D.   Dg Chest 1 View  07/12/2011  *RADIOLOGY REPORT*  Clinical Data: Right upper lobe mass biopsy, pneumothorax, chest tube  CHEST - 1 VIEW  Comparison: 07/11/2011  Findings: No significant pneumothorax.  Stable exam.  Right chest tube remains.  Right upper lobe mass again evident.  Stable cardiomegaly with vascular congestion.  No new edema or consolidation.  IMPRESSION: Stable exam.  No significant pneumothorax  Original Report Authenticated By: Judie Petit. Ruel Favors, M.D.    Anti-infectives: Anti-infectives    None      Assessment/Plan: S/p right lung mass biopsy  3/14 with subsequent pneumothorax and chest tube placement; follow up CXR today reveals no ptx; right chest tube removed in its entirety without immediate complications and vaseline gauze dressing applied to site; f/u CXR pending; if f/u film negative for ptx, will plan to d/c pt home with OP f/u with Dr. Tyrone Sage.   Samantha Quinn,D [facility] 07/13/2011

## 2011-07-13 NOTE — Discharge Summary (Signed)
Physician Discharge Summary  Patient ID: Samantha Quinn MRN: [id number] DOB/AGE: 1927-12-18 78 y.o.  Admit date: 07/09/2011 Discharge date: 07/13/2011  Admission Diagnoses: Right pneumothorax and chest tube placement  following CT guided core biopsy and fine needle aspiration of right upper lobe lung mass 07/09/11  Discharge Diagnoses:Primary: Status post right upper lobe lung mass biopsy 07/09/11 with subsequent 30-35% pneumothorax ; status post right chest tube placement 07/10/11 with resolution of pneumothorax; right chest tube removal on 07/13/11 with no pneumothorax . Right upper lobe lung mass pathology revealing adenocarcinoma  Secondary diagnoses: Hypertension, hypercholesterolemia, diabetes, hypothyroidism, osteoarthritis  Discharged Condition:  good  Hospital Course: Mrs.Waldschmidt  is an 78 year old white female who was referred to the Interventional  Radiology service by Dr. Tyrone Sage for CT guided biopsy of a hypermetabolic right upper lobe lung mass. On 07/09/11 the patient underwent CT-guided fine needle aspiration/ core biopsy of the right upper lobe lung mass by Dr. Richarda Overlie. The patient developed an asymptomatic right apical pneumothorax of approximately 10-15% post biopsy and was admitted to the hospital for further observation. On  07/10/11 followup chest x-ray revealed an increase in the pneumothorax to approximately 30- 35%. The patient did complain of some mild right upper chest discomfort. Vital signs were stable and the patient was not in respiratory distress . Secondary to  increase in size of the pneumothorax, a right chest tube was placed and attached to pleura vac with wall suction. Patient tolerated the procedure well with only minimal discomfort at the chest tube site. Followup chest x-ray revealed resolution of the pneumothorax. The patient's chest tube was left on wall suction until 07/12/11. Following confirmation of no pneumothorax on subsequent chest x-ray, the patient's chest  tube was placed to waterseal. The patient did well with chest tube to water seal for 24 hours and  the right chest tube was removed on 07/13/11 without difficulty . Followup chest x-ray post tube removal revealed no pneumothorax. Patient denied any other significant complaints at the time .She was tolerating her diet well, voiding without difficulty and having bowel movements. She was discharged home in stable condition and will followup with Dr. Tyrone Sage in his office 07/13/11 at 4:00 PM. Consults: none  Significant Diagnostic Studies: Chest x-ray on 07/09/2011 revealing 10-15% right apical pneumothorax . Chest x-ray on 07/10/2011 revealing increase in pneumothorax to 30-35%. Chest x-ray on 07/10/2011 post chest tube placement revealing resolution of pneumothorax. Chest x-ray on 07/13/2011 post chest tube removal revealing no pneumothorax. Pathology from right upper lobe lung mass revealing adenocarcinoma .  Treatments: Right chest tube placement secondary to pneumothorax following right upper lobe lung mass biopsy 07/10/2011  Discharge Exam: Blood pressure 129/67, pulse 61, temperature 97.6 F (36.4 C), temperature source Oral, resp. rate 18, height 5\' 1"  (1.549 m), weight 128 lb (58.06 kg), SpO2 99.00%. Patient is awake and alert, chest is clear to auscultation, right lateral chest tube insertion site covered with intact gauze /Vaseline dressing; heart regular rate and rhythm; abdomen soft, nontender/nondistended, positive bowel sounds; extremities with full range of motion, no edema Results for orders placed during the hospital encounter of 07/09/11  APTT      Component Value Range   aPTT 27  24 - 37 (seconds)  CBC      Component Value Range   WBC 6.4  4.0 - 10.5 (K/uL)   RBC 4.36  3.87 - 5.11 (MIL/uL)   Hemoglobin 12.3  12.0 - 15.0 (g/dL)   HCT 40.9  81.1 - 91.4 (%)  MCV 84.6  78.0 - 100.0 (fL)   MCH 28.2  26.0 - 34.0 (pg)   MCHC 33.3  30.0 - 36.0 (g/dL)   RDW 96.0  45.4 - 09.8 (%)    Platelets 287  150 - 400 (K/uL)  PROTIME-INR      Component Value Range   Prothrombin Time 12.1  11.6 - 15.2 (seconds)   INR 0.88  0.00 - 1.49   BODY FLUID CULTURE      Component Value Range   Specimen Description OTHER     Special Requests Normal     Gram Stain       Value: RARE WBC PRESENT,BOTH PMN AND MONONUCLEAR     NO SQUAMOUS EPITHELIAL CELLS SEEN     NO ORGANISMS SEEN   Culture NO GROWTH 3 DAYS     Report Status 07/13/2011 FINAL    AFB CULTURE WITH SMEAR      Component Value Range   Specimen Description LUNG     Special Requests Normal     ACID FAST SMEAR NO ACID FAST BACILLI SEEN     Culture       Value: CULTURE WILL BE EXAMINED FOR 6 WEEKS BEFORE ISSUING A FINAL REPORT   Report Status PENDING    CULTURE, FUNGUS WITHOUT SMEAR      Component Value Range   Specimen Description LUNG     Special Requests Normal     Culture CULTURE IN PROGRESS FOR FOUR WEEKS     Report Status PENDING    GLUCOSE, CAPILLARY      Component Value Range   Glucose-Capillary 91  70 - 99 (mg/dL)   Dg Chest 1 View  05/15/1476  *RADIOLOGY REPORT*  Clinical Data: Pneumothorax  CHEST - 1 VIEW  Comparison: Yesterday  Findings: Stable right chest tube.  No pneumothorax.  Stable right upper lobe mass.  IMPRESSION: No pneumothorax.  Stable.  Original Report Authenticated By: Donavan Burnet, M.D.   Dg Chest 1 View  07/12/2011  *RADIOLOGY REPORT*  Clinical Data: Right upper lobe mass biopsy, pneumothorax, chest tube  CHEST - 1 VIEW  Comparison: 07/11/2011  Findings: No significant pneumothorax.  Stable exam.  Right chest tube remains.  Right upper lobe mass again evident.  Stable cardiomegaly with vascular congestion.  No new edema or consolidation.  IMPRESSION: Stable exam.  No significant pneumothorax  Original Report Authenticated By: Judie Petit. Ruel Favors, M.D.   Dg Chest 1 View  07/11/2011  *RADIOLOGY REPORT*  Clinical Data: Right pneumothorax post lung biopsy, right upper lobe mass  CHEST - 1 VIEW   Comparison: 07/10/2011 and and several prior exams  Findings: Stable right pigtail chest drain position.  No current or enlarging pneumothorax.  Stable right upper lobe mass.  Lung aeration otherwise unchanged.  Mild cardiac enlargement.  IMPRESSION: Stable right pigtail chest drain.  No current pneumothorax  Original Report Authenticated By: Judie Petit. Ruel Favors, M.D.   Dg Chest 1 View  07/10/2011  *RADIOLOGY REPORT*  Clinical Data: Right-sided pneumothorax after lung biopsy.  CHEST - 1 VIEW  Comparison: 07/09/2011 at 1445 hours  Findings: There is increase in size of the right pneumothorax to roughly 30 - 35%.  Underlying lung mass again evident.  No associated pleural effusion or edema.  No visible shift of the mediastinum to imply component of tension.  IMPRESSION: Increase in size of right pneumothorax to roughly 30 - 35%.  Original Report Authenticated By: Reola Calkins, M.D.   Dg Chest 1 View  07/09/2011  *RADIOLOGY REPORT*  Clinical Data: Follow up pneumothorax, post biopsy.  CHEST - 1 VIEW  Comparison: 07/09/2011  Findings: Right apical pneumothorax again noted.  This appears slightly larger when compared to prior study although some of this could be positional.  Right upper lobe mass again noted.  Suspect small right effusion.  Mild cardiomegaly.  No confluent opacity on the left.  IMPRESSION: Stable or slight increased size of the right apical pneumothorax.  Small right effusion.  Original Report Authenticated By: Cyndie Chime, M.D.   Dg Chest 1 View  07/09/2011  *RADIOLOGY REPORT*  Clinical Data: Status post right lung biopsy  CHEST - 1 VIEW  Comparison: CT scan performed earlier today and chest x-ray from June 18, 2011  Findings: There is a 10 - 15% right apical pneumothorax.  Right upper lobe mass is again seen.  The left lung is clear.  The cardiac silhouette, mediastinum, pulmonary vasculature are within normal limits.  IMPRESSION: Right apical pneumothorax, approximately 10 - 15%.   Original Report Authenticated By: Brandon Melnick, M.D.   Dg Chest 2 View  06/18/2011  *RADIOLOGY REPORT*  Clinical Data: Abnormal chest x-ray  CHEST - 2 VIEW  Comparison: [place] study 06/13/2011.  Findings: Right apical density is again noted.  This density may be slightly less pronounced than on prior study.  I would recommend continued follow up to see if this resolves further.  Malignancy still is not excluded.  Remainder of the lungs are clear.  Heart is normal size.  No effusions.  IMPRESSION: Possible slight improvement in the right apical mass-like density. Recommend continued close follow-up.  Malignancy is still not excluded.  Original Report Authenticated By: Cyndie Chime, M.D.   Mr Laqueta Jean Wo Contrast  07/03/2011  *RADIOLOGY REPORT*  Clinical Data: Recent diagnosis of lung cancer.  Staging.  MRI HEAD WITHOUT AND WITH CONTRAST  Technique:  Multiplanar, multiecho pulse sequences of the brain and surrounding structures were obtained according to standard protocol without and with intravenous contrast  Contrast: 11mL MULTIHANCE GADOBENATE DIMEGLUMINE 529 MG/ML IV SOLN  Comparison: 08/08/2009  Findings: The brain has a normal appearance for age.  There is mild age related atrophy without evidence of old or acute infarction, mass lesion, hemorrhage, hydrocephalus or extra-axial collection. No pituitary mass.  No skull or skull base lesion.  Sinuses are clear.  Major vascular structures are patent.  IMPRESSION: Normal examination for age.  No evidence of metastatic disease.  Original Report Authenticated By: Thomasenia Sales, M.D.   Nm Pet Image Initial (pi) Skull Base To Thigh  07/03/2011  *RADIOLOGY REPORT*  Clinical Data:  Initial treatment strategy for lung mass.  NUCLEAR MEDICINE PET CT INITIAL (PI) SKULL BASE TO THIGH  Technique:  16.4 mCi F-18 FDG was injected intravenously via the left antecubital.  Full-ring PET imaging was performed from the skull base through the mid-thighs 16  minutes after  injection.  CT data was obtained and used for attenuation correction and anatomic localization only.  (This was not acquired as a diagnostic CT examination.)  Fasting Blood Glucose:  99  Patient Weight:  128 pounds.  Comparison:  Chest CT 06/13/2011.  Findings: The right upper lobe lung mass demonstrates low level FDG uptake with SUV max of 4.0.  This is likely neoplasm.  No FDG positive mediastinal or hilar lymph nodes.  No supraclavicular or axillary adenopathy.  No other pulmonary nodules.  No pleural effusion.  No findings for metastatic disease involving the  abdomen or pelvis. There is a benign appearing rim calcified cyst in the spleen.  No areas of osseous uptake to suggest metastatic disease.  No neck adenopathy.  The visualized portion of the brain is grossly normal.  The thyroid goiter is noted.  No abnormal FDG uptake.  IMPRESSION:  1.  Right upper lobe lung mass has neoplastic range FDG uptake but still possibly could be a chronic inflammatory process giving the adjacent bronchiectasis.  Biopsy is recommended. 2.  No mediastinal or hilar lymphadenopathy and no findings for metastatic disease.  Original Report Authenticated By: P. Loralie Champagne, M.D.   Ct Biopsy  07/09/2011  *RADIOLOGY REPORT*  Clinical history:78 year old with right upper lobe lesion. Evaluate for malignancy versus infection.  PROCEDURE(S): CT GUIDED FINE NEEDLE ASPIRATION AND CORE BIOPSIES OF RIGHT UPPER LOBE LESION  Physician: Rachelle Hora. Henn, MD  Medications:Versed 4 mg, Fentanyl 200 mcg. A radiology nurse monitored the patient for moderate sedation.  Moderate sedation time:40 minutes  Procedure:The procedure was explained to the patient.  The risks and benefits of the procedure were discussed and the patient's questions were addressed.  Informed consent was obtained from the patient.  The patient was placed prone and images through the upper chest were obtained.  A posterior approach was not felt to be ideal because it would require  traversing the superior segment of the right lower lobe.  The patient was placed in a supine position and images through the upper chest were obtained.  An approach was identified anteriorly below the right axillary vessels.  The right anterior breast tissue was prepped and draped in a sterile fashion. The skin was anesthetized with 1% lidocaine.  A 17 gauge needle was directed into the right upper lobe lesion with CT fluoroscopy. Needle placement confirmed within the lesion.  Four fine needle aspirations were obtained.  One of the aspirations was sent for culture.  Two core biopsies obtained an with 18 gauge device.  The 17 gauge needle was removed.  Final CT images demonstrated a moderate sized anterior pneumothorax.  The skin was prepped in the anterior mid chest.  Skin was anesthetized with 1% lidocaine.  5- [place] Yueh catheter was directed into the pleural space while aspirating.  Yueh catheter was placed.  Approximately 80 ml of air was removed.  No significant pneumothorax was identified following removal of this Yueh catheter.  The patient was placed with the right side down.  The patient was asymptomatic throughout the procedure.  Findings:Large area of consolidation with air bronchograms in the right upper lobe represents the lesion.  Needle placement was confirmed within the lesion.  The patient developed an irregular shaped pneumothorax during the procedure.  Majority of the pleural air was aspirated at the end of the case.  Complications: None  Impression:CT guided aspirations and biopsies of the  right upper lobe lesion.  The patient developed a pneumothorax during the procedure which was treated with aspiration.   The pneumothorax will be followed with chest radiography.  Original Report Authenticated By: Richarda Overlie, M.D.   Dg Chest Port 1 View  07/13/2011  *RADIOLOGY REPORT*  Clinical Data: Follow-up right pneumothorax.  Post chest tube removal.  PORTABLE CHEST - 1 VIEW  Comparison: 07/13/2011.   Findings: The right-sided chest tube has been removed.  There is no pneumothorax.  The right upper lobe ill-defined mass is unchanged. The left lung is clear.  The heart is normal in size.  IMPRESSION: Removal of right-sided chest tube.  No pneumothorax.  Original Report Authenticated By: Rolla Plate, M.D.   Dg Chest Port 1 View  07/10/2011  *RADIOLOGY REPORT*  Clinical Data: Right pneumothorax, status post chest tube.  PORTABLE CHEST - 1 VIEW  Comparison: 07/10/2011  Findings: Interval placement of right chest pigtail catheter in the right pleural space.  Near complete resolution of the right pneumothorax, now tiny.  Right upper lobe mass again noted.  COPD changes.  Heart is borderline in size.  IMPRESSION: Near complete resolution of the right pneumothorax following pleural pigtail catheter placement.  Original Report Authenticated By: Cyndie Chime, M.D.   Ir Perc Pleural Drain W/indwell Cath W/img Guide  07/10/2011  *RADIOLOGY REPORT*  Clinical Data: Enlarging right-sided pneumothorax after right upper lobe lung mass biopsy.  RIGHT CHEST TUBE THORACOSTOMY PLACEMENT UNDER FLUOROSCOPY GUIDANCE  Comparison:  Chest x-ray earlier today at 0800 hours  Sedation: 1.0 mg IV Versed; 100 mcg IV Fentanyl.  Total Moderate Sedation Time: 20 minutes.  Fluoroscopy Time: 0.8 minutes.  Procedure:  The procedure, risks, benefits, and alternatives were explained to the patient.  Questions regarding the procedure were encouraged and answered.  The patient understands and consents to the procedure.  The right lateral chest wall was prepped with Betadine in a sterile fashion, and a sterile drape was applied covering the operative field.  A sterile gown and sterile gloves were used for the procedure. Local anesthesia was provided with 1% Lidocaine.  The patient was placed in a decubitus position with the right side up.  From a midaxillary approach, a 19 gauge needle was advanced through a lower intercostal space into the  pleural space.  After return of air with aspiration, a guidewire was advanced into the pleural space.  The tract was dilated over a wire and a 12-French pigtail drainage catheter advanced under fluoroscopy.  Catheter position was confirmed with a fluoroscopic spot image.  The catheter was connected to a [place] Pleur-Evac device to -20 cm of water suction.  The catheter was secured at the skin with a Prolene retention suture and overlying Vasoline gauze dressing.  Complications: None  Findings: The 12-French pigtail catheter was advanced in the lateral pleural space with the pigtail portion near the apex. There was visible re-expansion of the lung after connecting the tube to a Pleur-Evac and wall suction.  A chest x-ray will be obtained immediately after the procedure.  IMPRESSION: Right chest tube thoracostomy placement under fluoroscopic guidance.  A 12-French catheter was placed.  This will be left to continous wall suction at a pressure of -20 cm of water.  Original Report Authenticated By: Reola Calkins, M.D.   Disposition: home; patient to follow up with Dr. Tyrone Sage on 07/13/11 at 4:00pm; patient was told to contact Dr. Tyrone Sage or interventional radiology service with any increasing chest pain or shortness of breath.   Discharge Orders    Future Appointments: Provider: Department: Dept Phone: Center:   07/13/2011 4:00 PM Delight Ovens, MD [facility] [phone] [facility]   08/11/2011 2:45 PM [name], MD [facility] [phone] None     Future Orders Please Complete By Expires   Diet - low sodium heart healthy      Increase activity slowly      Remove dressing in 24 hours      Scheduling Instructions:   Keep right chest gauze dressing in place for next 24 hours; may change and apply new dressing after 24 hours.   Lifting restrictions      Comments:   No  heavy lifting for next 48 hours   Call MD for:  temperature >100.4      Call MD for:  persistant nausea and vomiting       Call MD for:  severe uncontrolled pain      Call MD for:  redness, tenderness, or signs of infection (pain, swelling, redness, odor or green/yellow discharge around incision site)      Call MD for:  difficulty breathing, headache or visual disturbances      Call MD for:  persistant dizziness or light-headedness        Medication List  As of 07/13/2011 12:19 PM   TAKE these medications         amitriptyline 10 MG tablet   Commonly known as: ELAVIL   Take 5 mg by mouth at bedtime.      bisoprolol-hydrochlorothiazide 2.5-6.25 MG per tablet   Commonly known as: ZIAC   TAKE ONE TABLET BY MOUTH EVERY DAY      ibuprofen 200 MG tablet   Commonly known as: ADVIL,MOTRIN   Take 200 mg by mouth as needed.      levothyroxine 100 MCG tablet   Commonly known as: SYNTHROID, LEVOTHROID   Take 100 mcg by mouth every other day.      lovastatin 20 MG tablet   Commonly known as: MEVACOR   Take 20 mg by mouth at bedtime. Taking every other day      metFORMIN 500 MG tablet   Commonly known as: GLUCOPHAGE   TAKE ONE TABLET BY MOUTH TWICE DAILY      ramipril 2.5 MG capsule   Commonly known as: ALTACE   TAKE ONE CAPSULE BY MOUTH EVERY DAY      tolterodine 2 MG tablet   Commonly known as: DETROL   2 mg by Per post-pyloric tube route Daily. Per gyne      vitamin C 500 MG tablet   Commonly known as: ASCORBIC ACID   Take 500 mg by mouth daily.           Follow-up Information    Follow up with GERHARDT,EDWARD B, MD. (follow up with Dr. Tyrone Sage at 4:00 pm 3/18)    Contact information:   [address] [place] [address] [phone]          Signed: Chinita Pester 07/13/2011, 12:19 PM

## 2011-07-14 ENCOUNTER — Encounter: Payer: Self-pay | Admitting: Cardiothoracic Surgery

## 2011-07-14 ENCOUNTER — Other Ambulatory Visit: Payer: Self-pay

## 2011-07-14 ENCOUNTER — Other Ambulatory Visit: Payer: Self-pay | Admitting: Cardiothoracic Surgery

## 2011-07-14 DIAGNOSIS — C3490 Malignant neoplasm of unspecified part of unspecified bronchus or lung: Secondary | ICD-10-CM

## 2011-07-14 DIAGNOSIS — D381 Neoplasm of uncertain behavior of trachea, bronchus and lung: Secondary | ICD-10-CM

## 2011-07-14 NOTE — Progress Notes (Signed)
[address]            [place] [address]          [phone]       Samantha Quinn [facility] Medical Record #[id number] Date of Birth: 1927/12/12  Referring: Delight Ovens, MD Primary Care: Dr Patty Sermons  Chief Complaint:    Right upper lobe lung mass   History of Present Illness:    Patient is an 78 year old female lifelong nonsmoker who had an chest x-ray done in [place] several  Weeks ago . She had orthopedic surgery the day prior, took an Ultram the morning after surgery and had a syncopal episode. Because of a syncopal episode she was admitted to [facility] and observed. Chest x-ray revealed opacity in the right upper lobe, further evaluation with a CT scan of the chest was performed. The patient notes that she had some upper respiratory tract infection symptoms in early January with cough several days of fever, but this had resolved the week prior to her orthopedic surgery.  PET scan was done and the patient was referred for needle biopsy of the right upper lobe lung lesion. She had complication of a pneumothorax requiring a chest tube for several days she was discharged home from the hospital today.  She returns to the office to discuss the pathologic findings and treatment options.  Current Activity/ Functional Status: Patient is independent with mobility/ambulation, transfers, ADL's, IADL's.   Past Medical History  Diagnosis Date  . Diabetes mellitus   . Hyperlipidemia   . Hypertension   . Hypothyroidism   . Hypercholesterolemia   . Osteoarthritis   . Dyslipidemia   . Myalgia   . PONV (postoperative nausea and vomiting)   . Primary adenocarcinoma of lung 06/24/2011    Past Surgical History  Procedure Date  . Appendectomy   . Abdominal hysterectomy   . Cholecystectomy   . Hemorrhoid surgery   . Colonoscopy     Family History  Problem Relation Age of Onset  . Arthritis Sister   . Arthritis  Sister   . Diabetes Brother   . Hypertension Mother     History   Social History  . Marital Status: Married    Spouse Name: N/A    Number of Children: N/A  . Years of Education: N/A    Social History Main Topics  . Smoking status: Never Smoker   . Smokeless tobacco: Not on file  . Alcohol Use: No  . Drug Use: No      History  Smoking status  . Never Smoker   Smokeless tobacco  . Not on file    History  Alcohol Use No     Allergies  Allergen Reactions  . Codeine   . Demerol     nausea  . Tramadol     syncope    Current Outpatient Prescriptions  Medication Sig Dispense Refill  . amitriptyline (ELAVIL) 10 MG tablet Take 5 mg by mouth at bedtime.      . bisoprolol-hydrochlorothiazide (ZIAC) 2.5-6.25 MG per tablet TAKE ONE TABLET BY MOUTH EVERY DAY  90 tablet  3  . ibuprofen (ADVIL,MOTRIN) 200 MG tablet Take 200 mg by mouth as needed.       [name]  levothyroxine (SYNTHROID, LEVOTHROID) 100 MCG tablet Take 100 mcg by mouth every other day.        . lovastatin (MEVACOR) 20 MG tablet Take 20 mg by mouth at bedtime. Taking every other day      . metFORMIN (GLUCOPHAGE) 500 MG tablet TAKE ONE TABLET BY MOUTH TWICE DAILY  180 tablet  3  . ramipril (ALTACE) 2.5 MG capsule TAKE ONE CAPSULE BY MOUTH EVERY DAY  90 capsule  3  . tolterodine (DETROL) 2 MG tablet 2 mg by Per post-pyloric tube route Daily. Per gyne      . vitamin C (ASCORBIC ACID) 500 MG tablet Take 500 mg by mouth daily.          Review of Systems:     Cardiac Review of Systems: Y or N  Chest Pain [ n   ]  Resting SOB [ n  ] Exertional SOB  [ n ]  Orthopnea [n  ]   Pedal Edema [  n ]    Palpitations [  ] Syncope  [ y ]   Presyncope [n ]  General Review of Systems: [Y] = yes [  ]=no Constitional: recent weight change [  ]; anorexia [  ]; fatigue [  ]; nausea [  ]; night sweats [  ]; fever [  ]; or chills [  ];                                                                                                                                           Dental: poor dentition[  n];   Eye : blurred vision [  ]; diplopia [   ]; vision changes [  ];  Amaurosis fugax[  ]; Resp: cough [  ];  wheezing[  ];  hemoptysis[  ]; shortness of breath[  ]; paroxysmal nocturnal dyspnea[  ]; dyspnea on exertion[  ]; or orthopnea[  ];  GI:  gallstones[  ], vomiting[  ];  dysphagia[  ]; melena[  ];  hematochezia [  ]; heartburn[  ];   Hx of  Colonoscopy[  ]; GU: kidney stones [  ]; hematuria[  ];   dysuria [  ];  nocturia[  ];  history of     obstruction [  ];             Skin: rash, swelling[  ];, hair loss[  ];  peripheral edema[  ];  or itching[  ]; Musculosketetal: myalgias[  ];  joint swelling[  ];  joint erythema[  ];  joint pain[  ];  back pain[  ];  Heme/Lymph: bruising[  ];  bleeding[  ];  anemia[  ];  Neuro: TIA[  ];  headaches[  ];  stroke[ n ];  vertigo[y  ];  seizures[ n ];   paresthesias[  n];  difficulty walking[ n ];  Psych:depression[ n ]; anxiety[ n ];  Endocrine: diabetes[  ];  thyroid dysfunction[ n ];  Immunizations: Flu [?  ]; Pneumococcal[  ?];  Other:  Physical Exam: BP 142/81  Pulse 85  Resp 16  Ht 5\' 1"  (1.549 m)  Wt 132 lb (59.875 kg)  BMI 24.94 kg/m2  SpO2 96%  General appearance: alert, cooperative, appears stated age and no distress Neurologic: intact Heart: regular rate and rhythm, S1, S2 normal, no murmur, click, rub or gallop Lungs: clear to auscultation bilaterally Abdomen: soft, non-tender; bowel sounds normal; no masses,  no organomegaly Extremities: extremities normal, atraumatic, no cyanosis or edema and Homans sign is negative, no sign of DVT rt foot in support boot   Diagnostic Studies & Laboratory data:     Recent Radiology Findings:  Dg Chest 2 View  06/18/2011  *RADIOLOGY REPORT*  Clinical Data: Abnormal chest x-ray  CHEST - 2 VIEW  Comparison: [facility] study 06/13/2011.  Findings: Right apical density is again noted.  This density may be slightly less pronounced than on  prior study.  I would recommend continued follow up to see if this resolves further.  Malignancy still is not excluded.  Remainder of the lungs are clear.  Heart is normal size.  No effusions.  IMPRESSION: Possible slight improvement in the right apical mass-like density. Recommend continued close follow-up.  Malignancy is still not excluded.  Original Report Authenticated By: Cyndie Chime, M.D.   Mr Laqueta Jean Wo Contrast  07/03/2011  *RADIOLOGY REPORT*  Clinical Data: Recent diagnosis of lung cancer.  Staging.  MRI HEAD WITHOUT AND WITH CONTRAST  Technique:  Multiplanar, multiecho pulse sequences of the brain and surrounding structures were obtained according to standard protocol without and with intravenous contrast  Contrast: 11mL MULTIHANCE GADOBENATE DIMEGLUMINE 529 MG/ML IV SOLN  Comparison: 08/08/2009  Findings: The brain has a normal appearance for age.  There is mild age related atrophy without evidence of old or acute infarction, mass lesion, hemorrhage, hydrocephalus or extra-axial collection. No pituitary mass.  No skull or skull base lesion.  Sinuses are clear.  Major vascular structures are patent.  IMPRESSION: Normal examination for age.  No evidence of metastatic disease.  Original Report Authenticated By: Thomasenia Sales, M.D.   Nm Pet Image Initial (pi) Skull Base To Thigh  07/03/2011  *RADIOLOGY REPORT*  Clinical Data:  Initial treatment strategy for lung mass.  NUCLEAR MEDICINE PET CT INITIAL (PI) SKULL BASE TO THIGH  Technique:  16.4 mCi F-18 FDG was injected intravenously via the left antecubital.  Full-ring PET imaging was performed from the skull base through the mid-thighs 16  minutes after injection.  CT data was obtained and used for attenuation correction and anatomic localization only.  (This was not acquired as a diagnostic CT examination.)  Fasting Blood Glucose:  99  Patient Weight:  128 pounds.  Comparison:  Chest CT 06/13/2011.  Findings: The right upper lobe lung mass  demonstrates low level FDG uptake with SUV max of 4.0.  This is likely neoplasm.  No FDG positive mediastinal or hilar lymph nodes.  No supraclavicular or axillary adenopathy.  No other pulmonary nodules.  No pleural effusion.  No findings for metastatic disease involving the abdomen or pelvis. There is a benign appearing rim calcified cyst in the spleen.  No areas of osseous uptake to suggest metastatic disease.  No neck adenopathy.  The visualized portion of the brain is grossly normal.  The thyroid goiter is noted.  No abnormal FDG uptake.  IMPRESSION:  1.  Right  upper lobe lung mass has neoplastic range FDG uptake but still possibly could be a chronic inflammatory process giving the adjacent bronchiectasis.  Biopsy is recommended. 2.  No mediastinal or hilar lymphadenopathy and no findings for metastatic disease.  Original Report Authenticated By: P. Loralie Champagne, M.D.    Ct scan from [facility] reviewed ; Rt upper lobe mass 4cm with medial air bronchgrams more solid laterialy RADIOLOGY REPORT*  Clinical Data: Pneumonia post right foot surgery yesterday.  CT CHEST WITH CONTRAST  Technique: Multidetector CT imaging of the chest was performed following the standard protocol during bolus administration of intravenous contrast.  Contrast: 60 ml Isovue 370  Comparison: Acute abdomen series of 06/13/2011. No prior CT.[name]  Findings: Lung windows demonstrate mass-like opacity within the posterior right upper lobe, corresponding to the plain film abnormality. This measures 4.1 x 3.8 cm on image 11 transverse and 3.8 cm cranial caudal on sagittal image 44. Laterally, this is absent air bronchograms. There are air bronchograms medially. Minimal surrounding ground-glass opacity. Pleural contact identified anteriorly and superolaterally.  The left lung is clear.  Soft tissue windows demonstrate enlargement and heterogeneity of the right lobe of the thyroid. The most well-defined nodule measures 1.2 cm  on image 7 inferiorly.  Tortuous thoracic aorta. Heart size upper normal, without pericardial or pleural effusion. No central pulmonary embolism, on this non-dedicated study. No mediastinal or hilar adenopathy.  Limited abdominal imaging demonstrates peripherally calcified cystic lesion within the spleen which measures 5.9 cm. Normal adrenal glands. Mild osteopenia. Prior right rotator cuff repair. No acute osseous abnormality.  IMPRESSION:  1. Right upper lobe/apical mass-like opacity. Suspicious for primary bronchogenic carcinoma. There are air bronchograms medially and mild surrounding ground-glass opacity. Therefore, an area of infection is possible. If there are infectious symptoms, antibiotic therapy and short-term radiographic follow-up recommended. If no infectious symptoms, thoracic surgery consultation and eventual PET suggested. 2. No evidence of adenopathy to suggest nodal spread. 3. Splenic subcapsular calcified lesion is likely the sequelae of prior trauma or infection. 4. Heterogeneously enlarged right lobe of the thyroid. Consider correlation with ultrasound.       Recent Lab Findings: Lab Results  Component Value Date   WBC 6.4 07/09/2011   HGB 12.3 07/09/2011   HCT 36.9 07/09/2011   PLT 287 07/09/2011   GLUCOSE 129* 02/27/2011   CHOL 162 02/27/2011   TRIG 112.0 02/27/2011   HDL 53.20 02/27/2011   LDLCALC 86 02/27/2011   ALT 19 02/27/2011   AST 21 02/27/2011   NA 138 02/27/2011   K 3.8 02/27/2011   CL 103 02/27/2011   CREATININE 0.60 06/24/2011   BUN 16 02/27/2011   CO2 27 02/27/2011   TSH 0.01* 02/27/2011   INR 0.88 07/09/2011   Lung, biopsy, right upper mass Path [id number] - POSITIVE FOR ADENOCARCINOMA.   Assessment / Plan:     Newly discovered right upper lobe lung lesion  with some infiltrative component and more solid component, now biopsy proven adenocarcinoma right upper lobe approximately 4 cm in size. The  true size of the tumor is difficult to  determine with the infiltrative process present on the CT scan.  With the patient's syncopal episode we  obtained a MRI both for evaluation of the syncopal episode and also to rule out the possibility of brain metastasis which is negative for Metastatic disease   I discussed with  and recommended  to the patient and her daughters surgical treatment, probable right upper lobectomy considering the size of the tumor.  It appears to to be clinical stage I disease. The patient has good respiratory reserve by history, never been a smoker,  formal PFTs are pending. The risks and options of surgical treatment were discussed in detail. ( more then 45 min review of options in 89ffice today). At this point she is not sure she wants to proceed with surgical resection. She would like to also talk to radiation oncology about the radiation options, prior to making a final decision about proceeding with surgery. The size of the mass may limit the use of stereotactic radiotherapy for this stage I lesion, but we will get a opinion from radiation oncology.  Delight Ovens MD  Beeper [phone] Office [phone] 07/14/2011 8:25 AM

## 2011-07-15 ENCOUNTER — Telehealth: Payer: Self-pay | Admitting: *Deleted

## 2011-07-15 ENCOUNTER — Encounter: Payer: Self-pay | Admitting: *Deleted

## 2011-07-15 ENCOUNTER — Encounter (HOSPITAL_COMMUNITY): Payer: Self-pay | Admitting: Pharmacy Technician

## 2011-07-15 NOTE — Pre-Procedure Instructions (Signed)
20 Samantha Quinn  07/15/2011   Your procedure is scheduled on:  Fri, Mar 22 @ 1140am  Report to [facility] at 0730 AM.  Call this number if you have problems the morning of surgery: [phone]   Remember:   Do not eat food:After Midnight.  May have clear liquids: up to 4 Hours before arrival.(until 3:30 am)  Clear liquids include soda, tea, black coffee, apple or grape juice, broth,water  Take these medicines the morning of surgery with A SIP OF WATER: Ziac and Levothyroxine   Do not wear jewelry, make-up or nail polish.  Do not wear lotions, powders, or perfumes. You may wear deodorant.  Do not shave 48 hours prior to surgery.  Do not bring valuables to the hospital.  Contacts, dentures or bridgework may not be worn into surgery.  Leave suitcase in the car. After surgery it may be brought to your room.  For patients admitted to the hospital, checkout time is 11:00 AM the day of discharge.   Special Instructions: CHG Shower Use Special Wash: 1/2 bottle night before surgery and 1/2 bottle morning of surgery.   Please read over the following fact sheets that you were given: Pain Booklet, Coughing and Deep Breathing, Blood Transfusion Information, MRSA Information and Surgical Site Infection Prevention

## 2011-07-15 NOTE — Telephone Encounter (Signed)
Spoke with pt regarding appt time and place

## 2011-07-15 NOTE — Progress Notes (Signed)
Path: 07/09/11 Right upper lung mass biopsy=Adenocarcinoma  Married,   Allergies:Codeine,Demeral,Tramadol  Scheduled on 07/17/11, VATS, RUL lung resection with Dr. Tyrone Sage

## 2011-07-16 ENCOUNTER — Encounter (HOSPITAL_COMMUNITY): Payer: Self-pay

## 2011-07-16 ENCOUNTER — Other Ambulatory Visit: Payer: Self-pay

## 2011-07-16 ENCOUNTER — Ambulatory Visit (HOSPITAL_COMMUNITY)
Admission: RE | Admit: 2011-07-16 | Discharge: 2011-07-16 | Disposition: A | Discharge status: Home / Self Care | Payer: Medicare Other | Source: Ambulatory Visit | Attending: Cardiothoracic Surgery | Admitting: Cardiothoracic Surgery

## 2011-07-16 ENCOUNTER — Ambulatory Visit
Admission: RE | Admit: 2011-07-16 | Discharge: 2011-07-16 | Disposition: A | Discharge status: Home / Self Care | Payer: Medicare Other | Source: Ambulatory Visit | Attending: Radiation Oncology | Admitting: Radiation Oncology

## 2011-07-16 ENCOUNTER — Institutional Professional Consult (permissible substitution): Payer: Medicare Other

## 2011-07-16 ENCOUNTER — Encounter: Payer: Self-pay | Admitting: *Deleted

## 2011-07-16 ENCOUNTER — Encounter: Payer: Self-pay | Admitting: Radiation Oncology

## 2011-07-16 ENCOUNTER — Encounter (HOSPITAL_COMMUNITY)
Admission: RE | Admit: 2011-07-16 | Discharge: 2011-07-16 | Disposition: A | Discharge status: Home / Self Care | Payer: Medicare Other | Source: Ambulatory Visit | Attending: Cardiothoracic Surgery | Admitting: Cardiothoracic Surgery

## 2011-07-16 VITALS — BP 126/73 | HR 80 | Temp 97.0°F | Resp 20 | Ht 61.0 in | Wt 128.7 lb

## 2011-07-16 VITALS — BP 128/71 | HR 85 | Resp 16 | Ht 61.0 in | Wt 128.0 lb

## 2011-07-16 DIAGNOSIS — D381 Neoplasm of uncertain behavior of trachea, bronchus and lung: Secondary | ICD-10-CM

## 2011-07-16 DIAGNOSIS — E119 Type 2 diabetes mellitus without complications: Secondary | ICD-10-CM | POA: Diagnosis not present

## 2011-07-16 DIAGNOSIS — R222 Localized swelling, mass and lump, trunk: Secondary | ICD-10-CM | POA: Diagnosis not present

## 2011-07-16 DIAGNOSIS — C3490 Malignant neoplasm of unspecified part of unspecified bronchus or lung: Secondary | ICD-10-CM

## 2011-07-16 DIAGNOSIS — D62 Acute posthemorrhagic anemia: Secondary | ICD-10-CM | POA: Diagnosis not present

## 2011-07-16 DIAGNOSIS — I10 Essential (primary) hypertension: Secondary | ICD-10-CM | POA: Diagnosis not present

## 2011-07-16 DIAGNOSIS — Z01811 Encounter for preprocedural respiratory examination: Secondary | ICD-10-CM | POA: Diagnosis not present

## 2011-07-16 DIAGNOSIS — C3410 Malignant neoplasm of upper lobe, unspecified bronchus or lung: Secondary | ICD-10-CM | POA: Diagnosis not present

## 2011-07-16 HISTORY — DX: Pain in unspecified joint: M25.50

## 2011-07-16 HISTORY — DX: Personal history of colonic polyps: Z86.010

## 2011-07-16 HISTORY — DX: Personal history of other diseases of the nervous system and sense organs: Z86.69

## 2011-07-16 HISTORY — DX: Other nonspecific abnormal finding of lung field: R91.8

## 2011-07-16 HISTORY — DX: Bronchitis, not specified as acute or chronic: J40

## 2011-07-16 HISTORY — DX: Acute upper respiratory infection, unspecified: J06.9

## 2011-07-16 HISTORY — DX: Personal history of colon polyps, unspecified: Z86.0100

## 2011-07-16 HISTORY — DX: Unspecified malignant neoplasm of skin, unspecified: C44.90

## 2011-07-16 HISTORY — DX: Embolism and thrombosis of unspecified artery: I74.9

## 2011-07-16 HISTORY — DX: Insomnia, unspecified: G47.00

## 2011-07-16 HISTORY — DX: Dorsalgia, unspecified: M54.9

## 2011-07-16 LAB — TYPE AND SCREEN
ABO/RH(D): B POS
Antibody Screen: NEGATIVE

## 2011-07-16 LAB — CBC
HCT: 35.5 % — ABNORMAL LOW (ref 36.0–46.0)
Hemoglobin: 11.8 g/dL — ABNORMAL LOW (ref 12.0–15.0)
MCH: 28.4 pg (ref 26.0–34.0)
MCHC: 33.2 g/dL (ref 30.0–36.0)
MCV: 85.3 fL (ref 78.0–100.0)
Platelets: 295 10*3/uL (ref 150–400)
RBC: 4.16 MIL/uL (ref 3.87–5.11)
RDW: 14 % (ref 11.5–15.5)
WBC: 6.9 10*3/uL (ref 4.0–10.5)

## 2011-07-16 LAB — PROTIME-INR
INR: 0.96 (ref 0.00–1.49)
Prothrombin Time: 13 seconds (ref 11.6–15.2)

## 2011-07-16 LAB — SURGICAL PCR SCREEN
MRSA, PCR: NEGATIVE
Staphylococcus aureus: NEGATIVE

## 2011-07-16 LAB — URINALYSIS, ROUTINE W REFLEX MICROSCOPIC
Bilirubin Urine: NEGATIVE
Glucose, UA: NEGATIVE mg/dL
Hgb urine dipstick: NEGATIVE
Ketones, ur: NEGATIVE mg/dL
Leukocytes, UA: NEGATIVE
Nitrite: NEGATIVE
Protein, ur: NEGATIVE mg/dL
Specific Gravity, Urine: 1.016 (ref 1.005–1.030)
Urobilinogen, UA: 0.2 mg/dL (ref 0.0–1.0)
pH: 6 (ref 5.0–8.0)

## 2011-07-16 LAB — PULMONARY FUNCTION TEST

## 2011-07-16 LAB — COMPREHENSIVE METABOLIC PANEL
ALT: 16 U/L (ref 0–35)
AST: 18 U/L (ref 0–37)
Albumin: 3.5 g/dL (ref 3.5–5.2)
Alkaline Phosphatase: 66 U/L (ref 39–117)
BUN: 17 mg/dL (ref 6–23)
CO2: 21 mEq/L (ref 19–32)
Calcium: 10 mg/dL (ref 8.4–10.5)
Chloride: 103 mEq/L (ref 96–112)
Creatinine, Ser: 0.45 mg/dL — ABNORMAL LOW (ref 0.50–1.10)
GFR calc Af Amer: >90 mL/min (ref >90)
GFR calc non Af Amer: >90 mL/min (ref >90)
Glucose, Bld: 109 mg/dL — ABNORMAL HIGH (ref 70–99)
Potassium: 4.1 mEq/L (ref 3.5–5.1)
Sodium: 137 mEq/L (ref 135–145)
Total Bilirubin: 0.3 mg/dL (ref 0.3–1.2)
Total Protein: 7 g/dL (ref 6.0–8.3)

## 2011-07-16 LAB — APTT: aPTT: 29 seconds (ref 24–37)

## 2011-07-16 LAB — BLOOD GAS, ARTERIAL
Acid-base deficit: 1.4 mmol/L (ref 0.0–2.0)
Bicarbonate: 21.9 mEq/L (ref 20.0–24.0)
Drawn by: 344381
FIO2: 0.21 %
O2 Saturation: 98.5 %
Patient temperature: 98.6
TCO2: 22.9 mmol/L (ref 0–100)
pCO2 arterial: 31 mmHg — ABNORMAL LOW (ref 35.0–45.0)
pH, Arterial: 7.463 — ABNORMAL HIGH (ref 7.350–7.400)
pO2, Arterial: 99.6 mmHg (ref 80.0–100.0)

## 2011-07-16 LAB — ABO/RH: ABO/RH(D): B POS

## 2011-07-16 MED ORDER — CEFUROXIME SODIUM 1.5 G IJ SOLR
1.5000 g | INTRAMUSCULAR | Status: AC
  Administered 2011-07-17: 1.5 g via INTRAVENOUS
  Filled 2011-07-16: qty 1.5

## 2011-07-16 NOTE — Progress Notes (Signed)
Spoke with pt and daughter at [facility] 07/16/11.  Question and concerns answered.

## 2011-07-16 NOTE — Progress Notes (Signed)
Dr.brackbill is primary MD as well as her cardiologist  Denies ever having a heart cath/echo/stress test

## 2011-07-16 NOTE — Progress Notes (Signed)
Radiation Oncology         [phone]) [phone] ________________________________  Initial Outpatient [facility] Consultation  Name: Samantha Quinn  MRN: [id number]  Date: 07/16/2011  DOB: 28-Feb-1928  WU:[id number],WGNFAO B, MD, MD  Delight Ovens, MD   REFERRING PHYSICIAN: Delight Ovens, MD  DIAGNOSIS: 78 year old woman with a 4 cm adenocarcinoma the right upper lobe lung, pending surgery  HISTORY OF PRESENT ILLNESS::Samantha Quinn is a 78 y.o. female who is very nice woman who underwent orthopedic surgery for her right foot. Postoperatively, she experienced a syncopal episode and was brought to the [facility] emergency room. There, head imaging was normal. The patient did have an opacity in the right upper lung a chest x-ray. This was confirmed the solid soft tissue mass in the central right upper lobe measuring 4 cm. The patient proceeded to CT guided needle biopsy revealing adenocarcinoma. Consequently, she underwent head CT for staging. This showed the right upper lung mass had a elevated metabolism of 4.0. No mediastinal or distant metastatic disease was noted. Brain MRI showed no evidence of metastatic disease to the brain. The patient is currently under possible consideration for surgical resection and had pulmonary function testing earlier today are FEV1 of 1.52 L which was 119% of predicted value. Is worth noting that she is a nonsmoker. She was referred to [place] for the thoracic oncology clinic this afternoon following presentation in the multidisciplinary thoracic oncology conference.  PREVIOUS RADIATION THERAPY: No  PAST MEDICAL HISTORY:  has a past medical history of Diabetes mellitus; Hyperlipidemia; Hypercholesterolemia; Dyslipidemia; Myalgia; Primary adenocarcinoma of lung (06/24/2011); PONV (postoperative nausea and vomiting); Osteoarthritis; Pneumothorax of right lung after biopsy (07/09/11); Hypertension; Hypothyroidism; Embolism - blood clot; Lung mass; Bronchitis; Upper respiratory infection  (04/2011); migraines; Back pain; Osteoporosis; Joint pain; Skin cancer; History of colonic polyps; Urinary incontinence; Diabetes mellitus; and Insomnia.    PAST SURGICAL HISTORY: Past Surgical History  Procedure Date  . Cholecystectomy   . Hemorrhoid surgery   . Colonoscopy   . Lung biopsy 07/09/11    RIGHT UPER LOBE LUNG MASS   . Right chest tube placement 07/10/11    S/P LUNG BX  . Chest tube removal 07/13/11    RIGHT  CHEST TUBE  . Tonsillectomy     as a child  . Appendectomy     as a child  . Dilation and curettage of uterus     x 2  . Abdominal hysterectomy   . Rotator cuff repair     bilateral  . Toe surgery 2013    right small toe  . Colonoscopy     FAMILY HISTORY: family history includes Arthritis in her sisters; Diabetes in her brother; and Hypertension in her mother.  There is no history of Anesthesia problems, and Hypotension, and Malignant hyperthermia, and Pseudochol deficiency, .  SOCIAL HISTORY:  reports that she has never smoked. She does not have any smokeless tobacco history on file. She reports that she does not drink alcohol or use illicit drugs.  ALLERGIES: Codeine; Demerol; and Tramadol  MEDICATIONS:  Current Outpatient Prescriptions  Medication Sig Dispense Refill  . amitriptyline (ELAVIL) 10 MG tablet Take 5 mg by mouth at bedtime.      . bisoprolol-hydrochlorothiazide (ZIAC) 2.5-6.25 MG per tablet Take 1 tablet by mouth daily.      [name] ibuprofen (ADVIL,MOTRIN) 200 MG tablet Take 200 mg by mouth every 8 (eight) hours as needed. For pain      . levothyroxine (SYNTHROID, LEVOTHROID) 100 MCG  tablet Take 100 mcg by mouth every other day.       . lovastatin (MEVACOR) 20 MG tablet Take 20 mg by mouth every other day.       . metFORMIN (GLUCOPHAGE) 500 MG tablet Take 500 mg by mouth 2 (two) times daily with a meal.      . ramipril (ALTACE) 2.5 MG capsule Take 2.5 mg by mouth daily.       No current facility-administered medications for this encounter.    Facility-Administered Medications Ordered in Other Encounters  Medication Dose Route Frequency Provider Last Rate Last Dose  . cefUROXime (ZINACEF) 1.5 g in dextrose 5 % 50 mL IVPB  1.5 g Intravenous 60 min Pre-Op Delight Ovens, MD        REVIEW OF SYSTEMS:  A 15 point review of systems is documented in the electronic medical record. This was obtained by the nursing staff. However, I reviewed this with the patient to discuss relevant findings and make appropriate changes.  A comprehensive review of systems was negative.   PHYSICAL EXAM:  vitals were not taken for this visit.  The patient is in no acute distress today. She is alert and oriented. Respiratory effort is unremarkable. Her motor strength is grossly intact mood and affect are appropriate.  LABORATORY DATA:  Lab Results  Component Value Date   WBC 6.9 07/16/2011   HGB 11.8* 07/16/2011   HCT 35.5* 07/16/2011   MCV 85.3 07/16/2011   PLT 295 07/16/2011   Lab Results  Component Value Date   NA 137 07/16/2011   K 4.1 07/16/2011   CL 103 07/16/2011   CO2 21 07/16/2011   Lab Results  Component Value Date   ALT 16 07/16/2011   AST 18 07/16/2011   ALKPHOS 66 07/16/2011   BILITOT 0.3 07/16/2011     RADIOGRAPHY: Dg Chest 1 View  07/13/2011  *RADIOLOGY REPORT*  Clinical Data: Pneumothorax  CHEST - 1 VIEW  Comparison: Yesterday  Findings: Stable right chest tube.  No pneumothorax.  Stable right upper lobe mass.  IMPRESSION: No pneumothorax.  Stable.  Original Report Authenticated By: Donavan Burnet, M.D.   Dg Chest 1 View  07/12/2011  *RADIOLOGY REPORT*  Clinical Data: Right upper lobe mass biopsy, pneumothorax, chest tube  CHEST - 1 VIEW  Comparison: 07/11/2011  Findings: No significant pneumothorax.  Stable exam.  Right chest tube remains.  Right upper lobe mass again evident.  Stable cardiomegaly with vascular congestion.  No new edema or consolidation.  IMPRESSION: Stable exam.  No significant pneumothorax  Original Report  Authenticated By: Judie Petit. Ruel Favors, M.D.   Dg Chest 1 View  07/11/2011  *RADIOLOGY REPORT*  Clinical Data: Right pneumothorax post lung biopsy, right upper lobe mass  CHEST - 1 VIEW  Comparison: 07/10/2011 and and several prior exams  Findings: Stable right pigtail chest drain position.  No current or enlarging pneumothorax.  Stable right upper lobe mass.  Lung aeration otherwise unchanged.  Mild cardiac enlargement.  IMPRESSION: Stable right pigtail chest drain.  No current pneumothorax  Original Report Authenticated By: Judie Petit. Ruel Favors, M.D.   Dg Chest 1 View  07/10/2011  *RADIOLOGY REPORT*  Clinical Data: Right-sided pneumothorax after lung biopsy.  CHEST - 1 VIEW  Comparison: 07/09/2011 at 1445 hours  Findings: There is increase in size of the right pneumothorax to roughly 30 - 35%.  Underlying lung mass again evident.  No associated pleural effusion or edema.  No visible shift of the mediastinum to  imply component of tension.  IMPRESSION: Increase in size of right pneumothorax to roughly 30 - 35%.  Original Report Authenticated By: Reola Calkins, M.D.   Dg Chest 1 View  07/09/2011  *RADIOLOGY REPORT*  Clinical Data: Follow up pneumothorax, post biopsy.  CHEST - 1 VIEW  Comparison: 07/09/2011  Findings: Right apical pneumothorax again noted.  This appears slightly larger when compared to prior study although some of this could be positional.  Right upper lobe mass again noted.  Suspect small right effusion.  Mild cardiomegaly.  No confluent opacity on the left.  IMPRESSION: Stable or slight increased size of the right apical pneumothorax.  Small right effusion.  Original Report Authenticated By: Cyndie Chime, M.D.   Dg Chest 1 View  07/09/2011  *RADIOLOGY REPORT*  Clinical Data: Status post right lung biopsy  CHEST - 1 VIEW  Comparison: CT scan performed earlier today and chest x-ray from June 18, 2011  Findings: There is a 10 - 15% right apical pneumothorax.  Right upper lobe mass is again  seen.  The left lung is clear.  The cardiac silhouette, mediastinum, pulmonary vasculature are within normal limits.  IMPRESSION: Right apical pneumothorax, approximately 10 - 15%.  Original Report Authenticated By: Brandon Melnick, M.D.   Dg Chest 2 View  07/16/2011  *RADIOLOGY REPORT*  Clinical Data: Preoperative evaluation.  History of adenocarcinoma of the lung.  CHEST - 2 VIEW  Comparison: 07/13/2011.  Findings: The right upper lobe mass opacity appears stable.  No pneumothorax is evident.  No pulmonary infiltrates are seen on the left or in the mid and lower right lung areas.  No pleural effusion is seen. Ectasia and nonaneurysmal calcification of the thoracic aorta are seen.  There is mild thoracolumbar scoliosis. Cholecystectomy clips and right shoulder surgical clips are present. Stable normal appearance of cardiac silhouette.  IMPRESSION: Stable appearance of abnormal mass opacity in right upper lobe.  No evidence of pneumothorax.  No new or acute pulmonary infiltrative densities are evident.  Original Report Authenticated By: Crawford Givens, M.D.   Dg Chest 2 View  06/18/2011  *RADIOLOGY REPORT*  Clinical Data: Abnormal chest x-ray  CHEST - 2 VIEW  Comparison: [place] study 06/13/2011.  Findings: Right apical density is again noted.  This density may be slightly less pronounced than on prior study.  I would recommend continued follow up to see if this resolves further.  Malignancy still is not excluded.  Remainder of the lungs are clear.  Heart is normal size.  No effusions.  IMPRESSION: Possible slight improvement in the right apical mass-like density. Recommend continued close follow-up.  Malignancy is still not excluded.  Original Report Authenticated By: Cyndie Chime, M.D.   Mr Laqueta Jean Wo Contrast  07/03/2011  *RADIOLOGY REPORT*  Clinical Data: Recent diagnosis of lung cancer.  Staging.  MRI HEAD WITHOUT AND WITH CONTRAST  Technique:  Multiplanar, multiecho pulse sequences of the brain and  surrounding structures were obtained according to standard protocol without and with intravenous contrast  Contrast: 11mL MULTIHANCE GADOBENATE DIMEGLUMINE 529 MG/ML IV SOLN  Comparison: 08/08/2009  Findings: The brain has a normal appearance for age.  There is mild age related atrophy without evidence of old or acute infarction, mass lesion, hemorrhage, hydrocephalus or extra-axial collection. No pituitary mass.  No skull or skull base lesion.  Sinuses are clear.  Major vascular structures are patent.  IMPRESSION: Normal examination for age.  No evidence of metastatic disease.  Original Report Authenticated By: Loraine Leriche  E. Karin Golden, M.D.   Nm Pet Image Initial (pi) Skull Base To Thigh  07/03/2011  *RADIOLOGY REPORT*  Clinical Data:  Initial treatment strategy for lung mass.  NUCLEAR MEDICINE PET CT INITIAL (PI) SKULL BASE TO THIGH  Technique:  16.4 mCi F-18 FDG was injected intravenously via the left antecubital.  Full-ring PET imaging was performed from the skull base through the mid-thighs 16  minutes after injection.  CT data was obtained and used for attenuation correction and anatomic localization only.  (This was not acquired as a diagnostic CT examination.)  Fasting Blood Glucose:  99  Patient Weight:  128 pounds.  Comparison:  Chest CT 06/13/2011.  Findings: The right upper lobe lung mass demonstrates low level FDG uptake with SUV max of 4.0.  This is likely neoplasm.  No FDG positive mediastinal or hilar lymph nodes.  No supraclavicular or axillary adenopathy.  No other pulmonary nodules.  No pleural effusion.  No findings for metastatic disease involving the abdomen or pelvis. There is a benign appearing rim calcified cyst in the spleen.  No areas of osseous uptake to suggest metastatic disease.  No neck adenopathy.  The visualized portion of the brain is grossly normal.  The thyroid goiter is noted.  No abnormal FDG uptake.  IMPRESSION:  1.  Right upper lobe lung mass has neoplastic range FDG uptake but  still possibly could be a chronic inflammatory process giving the adjacent bronchiectasis.  Biopsy is recommended. 2.  No mediastinal or hilar lymphadenopathy and no findings for metastatic disease.  Original Report Authenticated By: P. Loralie Champagne, M.D.   Ct Biopsy  07/09/2011  *RADIOLOGY REPORT*  Clinical history:78 year old with right upper lobe lesion. Evaluate for malignancy versus infection.  PROCEDURE(S): CT GUIDED FINE NEEDLE ASPIRATION AND CORE BIOPSIES OF RIGHT UPPER LOBE LESION  Physician: Rachelle Hora. Henn, MD  Medications:Versed 4 mg, Fentanyl 200 mcg. A radiology nurse monitored the patient for moderate sedation.  Moderate sedation time:40 minutes  Procedure:The procedure was explained to the patient.  The risks and benefits of the procedure were discussed and the patient's questions were addressed.  Informed consent was obtained from the patient.  The patient was placed prone and images through the upper chest were obtained.  A posterior approach was not felt to be ideal because it would require traversing the superior segment of the right lower lobe.  The patient was placed in a supine position and images through the upper chest were obtained.  An approach was identified anteriorly below the right axillary vessels.  The right anterior breast tissue was prepped and draped in a sterile fashion. The skin was anesthetized with 1% lidocaine.  A 17 gauge needle was directed into the right upper lobe lesion with CT fluoroscopy. Needle placement confirmed within the lesion.  Four fine needle aspirations were obtained.  One of the aspirations was sent for culture.  Two core biopsies obtained an with 18 gauge device.  The 17 gauge needle was removed.  Final CT images demonstrated a moderate sized anterior pneumothorax.  The skin was prepped in the anterior mid chest.  Skin was anesthetized with 1% lidocaine.  5- [place] Yueh catheter was directed into the pleural space while aspirating.  Yueh catheter was  placed.  Approximately 80 ml of air was removed.  No significant pneumothorax was identified following removal of this Yueh catheter.  The patient was placed with the right side down.  The patient was asymptomatic throughout the procedure.  Findings:Large area of consolidation with  air bronchograms in the right upper lobe represents the lesion.  Needle placement was confirmed within the lesion.  The patient developed an irregular shaped pneumothorax during the procedure.  Majority of the pleural air was aspirated at the end of the case.  Complications: None  Impression:CT guided aspirations and biopsies of the  right upper lobe lesion.  The patient developed a pneumothorax during the procedure which was treated with aspiration.   The pneumothorax will be followed with chest radiography.  Original Report Authenticated By: Richarda Overlie, M.D.   Dg Chest Port 1 View  07/13/2011  *RADIOLOGY REPORT*  Clinical Data: Follow-up right pneumothorax.  Post chest tube removal.  PORTABLE CHEST - 1 VIEW  Comparison: 07/13/2011.  Findings: The right-sided chest tube has been removed.  There is no pneumothorax.  The right upper lobe ill-defined mass is unchanged. The left lung is clear.  The heart is normal in size.  IMPRESSION: Removal of right-sided chest tube.  No pneumothorax.  Original Report Authenticated By: Rolla Plate, M.D.   Dg Chest Port 1 View  07/10/2011  *RADIOLOGY REPORT*  Clinical Data: Right pneumothorax, status post chest tube.  PORTABLE CHEST - 1 VIEW  Comparison: 07/10/2011  Findings: Interval placement of right chest pigtail catheter in the right pleural space.  Near complete resolution of the right pneumothorax, now tiny.  Right upper lobe mass again noted.  COPD changes.  Heart is borderline in size.  IMPRESSION: Near complete resolution of the right pneumothorax following pleural pigtail catheter placement.  Original Report Authenticated By: Cyndie Chime, M.D.   Ir Perc Pleural Drain W/indwell  Cath W/img Guide  07/10/2011  *RADIOLOGY REPORT*  Clinical Data: Enlarging right-sided pneumothorax after right upper lobe lung mass biopsy.  RIGHT CHEST TUBE THORACOSTOMY PLACEMENT UNDER FLUOROSCOPY GUIDANCE  Comparison:  Chest x-ray earlier today at 0800 hours  Sedation: 1.0 mg IV Versed; 100 mcg IV Fentanyl.  Total Moderate Sedation Time: 20 minutes.  Fluoroscopy Time: 0.8 minutes.  Procedure:  The procedure, risks, benefits, and alternatives were explained to the patient.  Questions regarding the procedure were encouraged and answered.  The patient understands and consents to the procedure.  The right lateral chest wall was prepped with Betadine in a sterile fashion, and a sterile drape was applied covering the operative field.  A sterile gown and sterile gloves were used for the procedure. Local anesthesia was provided with 1% Lidocaine.  The patient was placed in a decubitus position with the right side up.  From a midaxillary approach, a 19 gauge needle was advanced through a lower intercostal space into the pleural space.  After return of air with aspiration, a guidewire was advanced into the pleural space.  The tract was dilated over a wire and a 12-French pigtail drainage catheter advanced under fluoroscopy.  Catheter position was confirmed with a fluoroscopic spot image.  The catheter was connected to a [place] Pleur-Evac device to -20 cm of water suction.  The catheter was secured at the skin with a Prolene retention suture and overlying Vasoline gauze dressing.  Complications: None  Findings: The 12-French pigtail catheter was advanced in the lateral pleural space with the pigtail portion near the apex. There was visible re-expansion of the lung after connecting the tube to a Pleur-Evac and wall suction.  A chest x-ray will be obtained immediately after the procedure.  IMPRESSION: Right chest tube thoracostomy placement under fluoroscopic guidance.  A 12-French catheter was placed.  This will be left to  continous wall  suction at a pressure of -20 cm of water.  Original Report Authenticated By: Reola Calkins, M.D.      IMPRESSION: The patient is a very nice 78 year old nonsmoker with a 4 cm adenocarcinoma or lung with no evidence of metastatic disease on imaging. She represented a true surgical candidate because of her otherwise excellent performance status.  PLAN: Today, I talked the patient about the findings and workup thus far. We discussed the management of non-small cell lung cancer ranging from surgery to conventional radiation and stereotactic body radiotherapy. We also talked about the potential role for postoperative radiation and possible adjuvant chemotherapy. I spent 30 minutes minutes face to face with the patient and more than 50% of that time was spent in counseling and/or coordination of care. I explained that surgical resection the form of lobectomy represents the standard of care for early stage lung cancer in operable patient's. I we talked about the role of radiation for medically inoperable patient's. We went through the pros and cons of surgery versus radiation in great detail. I explained that radiation treatment may not be necessary following surgery unless the patient has N2 nodal disease or possibly worrisome surgical margins.  We also talked briefly today about the status of EGFR on the final tumor specimen and discussed how this may influence the potential systemic treatment options in the event that systemic therapy is warranted. The patient and her daughter had several questions initially and seemed to be satisfied with the plan to proceed with right upper lobectomy.  I enjoyed meeting Ms. Ketner and her daughter. I would be more than happy to remain involved in her care if radiation therapy is indicated.   ------------------------------------------------  Artist Pais Kathrynn Running, M.D.

## 2011-07-17 ENCOUNTER — Encounter (HOSPITAL_COMMUNITY)
Admission: RE | Disposition: A | Discharge status: Home / Self Care | Payer: Self-pay | Source: Ambulatory Visit | Attending: Cardiothoracic Surgery

## 2011-07-17 ENCOUNTER — Encounter (HOSPITAL_COMMUNITY): Payer: Self-pay | Admitting: Anesthesiology

## 2011-07-17 ENCOUNTER — Inpatient Hospital Stay (HOSPITAL_COMMUNITY)
Admission: RE | Admit: 2011-07-17 | Discharge: 2011-07-22 | DRG: 164 | Disposition: A | Discharge status: Home / Self Care | Payer: Medicare Other | Source: Ambulatory Visit | Attending: Cardiothoracic Surgery | Admitting: Cardiothoracic Surgery

## 2011-07-17 ENCOUNTER — Ambulatory Visit (HOSPITAL_COMMUNITY): Payer: Medicare Other | Admitting: Anesthesiology

## 2011-07-17 ENCOUNTER — Encounter (HOSPITAL_COMMUNITY): Payer: Self-pay | Admitting: *Deleted

## 2011-07-17 ENCOUNTER — Ambulatory Visit (HOSPITAL_COMMUNITY): Payer: Medicare Other

## 2011-07-17 DIAGNOSIS — E039 Hypothyroidism, unspecified: Secondary | ICD-10-CM | POA: Diagnosis present

## 2011-07-17 DIAGNOSIS — R222 Localized swelling, mass and lump, trunk: Secondary | ICD-10-CM | POA: Diagnosis not present

## 2011-07-17 DIAGNOSIS — K5900 Constipation, unspecified: Secondary | ICD-10-CM | POA: Diagnosis present

## 2011-07-17 DIAGNOSIS — G4700 Insomnia, unspecified: Secondary | ICD-10-CM | POA: Diagnosis present

## 2011-07-17 DIAGNOSIS — E119 Type 2 diabetes mellitus without complications: Secondary | ICD-10-CM | POA: Diagnosis not present

## 2011-07-17 DIAGNOSIS — E785 Hyperlipidemia, unspecified: Secondary | ICD-10-CM | POA: Diagnosis present

## 2011-07-17 DIAGNOSIS — Z90710 Acquired absence of both cervix and uterus: Secondary | ICD-10-CM

## 2011-07-17 DIAGNOSIS — C3431 Malignant neoplasm of lower lobe, right bronchus or lung: Secondary | ICD-10-CM | POA: Diagnosis present

## 2011-07-17 DIAGNOSIS — R918 Other nonspecific abnormal finding of lung field: Secondary | ICD-10-CM | POA: Diagnosis not present

## 2011-07-17 DIAGNOSIS — J90 Pleural effusion, not elsewhere classified: Secondary | ICD-10-CM | POA: Diagnosis not present

## 2011-07-17 DIAGNOSIS — D381 Neoplasm of uncertain behavior of trachea, bronchus and lung: Secondary | ICD-10-CM

## 2011-07-17 DIAGNOSIS — I10 Essential (primary) hypertension: Secondary | ICD-10-CM | POA: Diagnosis present

## 2011-07-17 DIAGNOSIS — M549 Dorsalgia, unspecified: Secondary | ICD-10-CM | POA: Diagnosis not present

## 2011-07-17 DIAGNOSIS — C3490 Malignant neoplasm of unspecified part of unspecified bronchus or lung: Secondary | ICD-10-CM | POA: Diagnosis not present

## 2011-07-17 DIAGNOSIS — Z860100 Personal history of colon polyps, unspecified: Secondary | ICD-10-CM

## 2011-07-17 DIAGNOSIS — C3410 Malignant neoplasm of upper lobe, unspecified bronchus or lung: Secondary | ICD-10-CM | POA: Diagnosis not present

## 2011-07-17 DIAGNOSIS — Z886 Allergy status to analgesic agent status: Secondary | ICD-10-CM | POA: Diagnosis not present

## 2011-07-17 DIAGNOSIS — R32 Unspecified urinary incontinence: Secondary | ICD-10-CM | POA: Diagnosis present

## 2011-07-17 DIAGNOSIS — Z09 Encounter for follow-up examination after completed treatment for conditions other than malignant neoplasm: Secondary | ICD-10-CM | POA: Diagnosis not present

## 2011-07-17 DIAGNOSIS — M810 Age-related osteoporosis without current pathological fracture: Secondary | ICD-10-CM | POA: Diagnosis present

## 2011-07-17 DIAGNOSIS — J9383 Other pneumothorax: Secondary | ICD-10-CM | POA: Diagnosis not present

## 2011-07-17 DIAGNOSIS — Z01812 Encounter for preprocedural laboratory examination: Secondary | ICD-10-CM

## 2011-07-17 DIAGNOSIS — Z86718 Personal history of other venous thrombosis and embolism: Secondary | ICD-10-CM | POA: Diagnosis not present

## 2011-07-17 DIAGNOSIS — Z85820 Personal history of malignant melanoma of skin: Secondary | ICD-10-CM | POA: Diagnosis not present

## 2011-07-17 DIAGNOSIS — D62 Acute posthemorrhagic anemia: Secondary | ICD-10-CM | POA: Diagnosis not present

## 2011-07-17 DIAGNOSIS — Z79899 Other long term (current) drug therapy: Secondary | ICD-10-CM | POA: Diagnosis not present

## 2011-07-17 DIAGNOSIS — J9819 Other pulmonary collapse: Secondary | ICD-10-CM | POA: Diagnosis not present

## 2011-07-17 HISTORY — PX: VIDEO BRONCHOSCOPY: SHX5072

## 2011-07-17 LAB — GLUCOSE, CAPILLARY
Glucose-Capillary: 111 mg/dL — ABNORMAL HIGH (ref 70–99)
Glucose-Capillary: 133 mg/dL — ABNORMAL HIGH (ref 70–99)
Glucose-Capillary: 147 mg/dL — ABNORMAL HIGH (ref 70–99)

## 2011-07-17 SURGERY — VIDEO ASSISTED THORACOSCOPY (VATS)/WEDGE RESECTION
Anesthesia: General | Site: Chest | Laterality: Right | Wound class: Clean Contaminated

## 2011-07-17 MED ORDER — SENNOSIDES-DOCUSATE SODIUM 8.6-50 MG PO TABS
1.0000 | ORAL_TABLET | Freq: Every evening | ORAL | Status: DC | PRN
  Filled 2011-07-17: qty 1

## 2011-07-17 MED ORDER — ONDANSETRON HCL 4 MG/2ML IJ SOLN
INTRAMUSCULAR | Status: DC | PRN
  Administered 2011-07-17: 4 mg via INTRAVENOUS

## 2011-07-17 MED ORDER — DEXTROSE 5 % IV SOLN
1.5000 g | Freq: Two times a day (BID) | INTRAVENOUS | Status: AC
  Administered 2011-07-18 (×2): 1.5 g via INTRAVENOUS
  Filled 2011-07-17 (×2): qty 1.5

## 2011-07-17 MED ORDER — PHENYLEPHRINE HCL 10 MG/ML IJ SOLN
INTRAMUSCULAR | Status: DC | PRN
  Administered 2011-07-17 (×4): 40 ug via INTRAVENOUS
  Administered 2011-07-17: 80 ug via INTRAVENOUS

## 2011-07-17 MED ORDER — ONDANSETRON HCL 4 MG/2ML IJ SOLN: 4.0000 mg | Freq: Four times a day (QID) | INTRAMUSCULAR | Status: DC | PRN

## 2011-07-17 MED ORDER — OXYCODONE HCL 5 MG PO TABS: 5.0000 mg | ORAL_TABLET | ORAL | Status: AC | PRN

## 2011-07-17 MED ORDER — FENTANYL CITRATE 0.05 MG/ML IJ SOLN
INTRAMUSCULAR | Status: DC | PRN
  Administered 2011-07-17: 25 ug via INTRAVENOUS
  Administered 2011-07-17: 150 ug via INTRAVENOUS
  Administered 2011-07-17 (×2): 25 ug via INTRAVENOUS
  Administered 2011-07-17: 50 ug via INTRAVENOUS
  Administered 2011-07-17: 25 ug via INTRAVENOUS
  Administered 2011-07-17: 50 ug via INTRAVENOUS
  Administered 2011-07-17: 25 ug via INTRAVENOUS
  Administered 2011-07-17: 50 ug via INTRAVENOUS
  Administered 2011-07-17: 25 ug via INTRAVENOUS

## 2011-07-17 MED ORDER — DIPHENHYDRAMINE HCL 12.5 MG/5ML PO ELIX
12.5000 mg | ORAL_SOLUTION | Freq: Four times a day (QID) | ORAL | Status: DC | PRN
  Filled 2011-07-17: qty 5

## 2011-07-17 MED ORDER — PROPOFOL 10 MG/ML IV EMUL
INTRAVENOUS | Status: DC | PRN
  Administered 2011-07-17: 110 mg via INTRAVENOUS
  Administered 2011-07-17: 50 mg via INTRAVENOUS
  Administered 2011-07-17: 40 mg via INTRAVENOUS

## 2011-07-17 MED ORDER — GLYCOPYRROLATE 0.2 MG/ML IJ SOLN
INTRAMUSCULAR | Status: DC | PRN
  Administered 2011-07-17: 0.6 mg via INTRAVENOUS

## 2011-07-17 MED ORDER — METFORMIN HCL 500 MG PO TABS
500.0000 mg | ORAL_TABLET | Freq: Two times a day (BID) | ORAL | Status: DC
  Administered 2011-07-18 – 2011-07-22 (×9): 500 mg via ORAL
  Filled 2011-07-17 (×11): qty 1

## 2011-07-17 MED ORDER — BUPIVACAINE 0.5 % ON-Q PUMP SINGLE CATH 400 ML
400.0000 mL | INJECTION | Status: DC
  Filled 2011-07-17 (×2): qty 400

## 2011-07-17 MED ORDER — OXYCODONE-ACETAMINOPHEN 5-325 MG PO TABS: 1.0000 | ORAL_TABLET | ORAL | Status: DC | PRN

## 2011-07-17 MED ORDER — ALBUMIN HUMAN 5 % IV SOLN
INTRAVENOUS | Status: DC | PRN
  Administered 2011-07-17: 14:00:00 via INTRAVENOUS

## 2011-07-17 MED ORDER — MIDAZOLAM HCL 2 MG/2ML IJ SOLN
1.0000 mg | INTRAMUSCULAR | Status: DC | PRN
  Administered 2011-07-17: 2 mg via INTRAVENOUS

## 2011-07-17 MED ORDER — DIPHENHYDRAMINE HCL 50 MG/ML IJ SOLN: 12.5000 mg | Freq: Four times a day (QID) | INTRAMUSCULAR | Status: DC | PRN

## 2011-07-17 MED ORDER — BISACODYL 5 MG PO TBEC
10.0000 mg | DELAYED_RELEASE_TABLET | Freq: Every day | ORAL | Status: DC
  Administered 2011-07-18 – 2011-07-21 (×4): 10 mg via ORAL
  Filled 2011-07-17 (×4): qty 2

## 2011-07-17 MED ORDER — ACETAMINOPHEN 10 MG/ML IV SOLN
1000.0000 mg | Freq: Four times a day (QID) | INTRAVENOUS | Status: AC
  Administered 2011-07-18 (×4): 1000 mg via INTRAVENOUS
  Filled 2011-07-17 (×4): qty 100

## 2011-07-17 MED ORDER — LACTATED RINGERS IV SOLN
INTRAVENOUS | Status: DC
  Administered 2011-07-17: 12:00:00 via INTRAVENOUS

## 2011-07-17 MED ORDER — LEVOTHYROXINE SODIUM 100 MCG PO TABS
100.0000 ug | ORAL_TABLET | ORAL | Status: DC
  Administered 2011-07-18 – 2011-07-22 (×3): 100 ug via ORAL
  Filled 2011-07-17 (×3): qty 1

## 2011-07-17 MED ORDER — POTASSIUM CHLORIDE IN NACL 20-0.9 MEQ/L-% IV SOLN
INTRAVENOUS | Status: DC
  Administered 2011-07-17: 125 mL/h via INTRAVENOUS
  Administered 2011-07-18 (×2): 50 mL/h via INTRAVENOUS
  Administered 2011-07-19: 13:00:00 via INTRAVENOUS
  Filled 2011-07-17 (×7): qty 1000

## 2011-07-17 MED ORDER — ONDANSETRON HCL 4 MG/2ML IJ SOLN: 4.0000 mg | Freq: Once | INTRAMUSCULAR | Status: DC | PRN

## 2011-07-17 MED ORDER — FENTANYL CITRATE 0.05 MG/ML IJ SOLN
INTRAMUSCULAR | Status: AC
  Filled 2011-07-17: qty 2

## 2011-07-17 MED ORDER — HYDROMORPHONE HCL PF 1 MG/ML IJ SOLN
INTRAMUSCULAR | Status: AC
  Filled 2011-07-17: qty 1

## 2011-07-17 MED ORDER — POTASSIUM CHLORIDE 10 MEQ/50ML IV SOLN
10.0000 meq | Freq: Every day | INTRAVENOUS | Status: DC | PRN
  Filled 2011-07-17: qty 50

## 2011-07-17 MED ORDER — SIMVASTATIN 10 MG PO TABS
10.0000 mg | ORAL_TABLET | Freq: Every day | ORAL | Status: DC
  Administered 2011-07-18 – 2011-07-21 (×4): 10 mg via ORAL
  Filled 2011-07-17 (×5): qty 1

## 2011-07-17 MED ORDER — NALOXONE HCL 0.4 MG/ML IJ SOLN: 0.4000 mg | INTRAMUSCULAR | Status: DC | PRN

## 2011-07-17 MED ORDER — NEOSTIGMINE METHYLSULFATE 1 MG/ML IJ SOLN
INTRAMUSCULAR | Status: DC | PRN
  Administered 2011-07-17: 4 mg via INTRAVENOUS

## 2011-07-17 MED ORDER — LACTATED RINGERS IV SOLN
INTRAVENOUS | Status: DC | PRN
  Administered 2011-07-17 (×2): via INTRAVENOUS

## 2011-07-17 MED ORDER — FENTANYL CITRATE 0.05 MG/ML IJ SOLN
50.0000 ug | INTRAMUSCULAR | Status: DC | PRN
  Administered 2011-07-17: 100 ug via INTRAVENOUS

## 2011-07-17 MED ORDER — RAMIPRIL 2.5 MG PO CAPS
2.5000 mg | ORAL_CAPSULE | Freq: Every day | ORAL | Status: DC
  Administered 2011-07-17 – 2011-07-22 (×6): 2.5 mg via ORAL
  Filled 2011-07-17 (×6): qty 1

## 2011-07-17 MED ORDER — AMITRIPTYLINE HCL 10 MG PO TABS
5.0000 mg | ORAL_TABLET | Freq: Every day | ORAL | Status: DC
  Administered 2011-07-18 – 2011-07-21 (×4): 5 mg via ORAL
  Filled 2011-07-17 (×6): qty 0.5

## 2011-07-17 MED ORDER — MIDAZOLAM HCL 2 MG/2ML IJ SOLN
INTRAMUSCULAR | Status: AC
  Filled 2011-07-17: qty 2

## 2011-07-17 MED ORDER — INSULIN ASPART 100 UNIT/ML ~~LOC~~ SOLN
0.0000 [IU] | Freq: Three times a day (TID) | SUBCUTANEOUS | Status: DC
  Administered 2011-07-17 – 2011-07-19 (×5): 2 [IU] via SUBCUTANEOUS
  Administered 2011-07-19: 4 [IU] via SUBCUTANEOUS
  Administered 2011-07-20 – 2011-07-21 (×4): 2 [IU] via SUBCUTANEOUS

## 2011-07-17 MED ORDER — MORPHINE SULFATE 2 MG/ML IJ SOLN: 0.0500 mg/kg | INTRAMUSCULAR | Status: DC | PRN

## 2011-07-17 MED ORDER — BUPIVACAINE ON-Q PAIN PUMP (FOR ORDER SET NO CHG)
INJECTION | Status: DC
  Filled 2011-07-17: qty 1

## 2011-07-17 MED ORDER — HYDROMORPHONE HCL PF 1 MG/ML IJ SOLN
0.2500 mg | INTRAMUSCULAR | Status: DC | PRN
  Administered 2011-07-17: 0.5 mg via INTRAVENOUS
  Administered 2011-07-17: 0.25 mg via INTRAVENOUS
  Administered 2011-07-17 (×2): 0.5 mg via INTRAVENOUS

## 2011-07-17 MED ORDER — HYDROMORPHONE HCL PF 1 MG/ML IJ SOLN: 0.2500 mg | INTRAMUSCULAR | Status: DC | PRN

## 2011-07-17 MED ORDER — SODIUM CHLORIDE 0.9 % IJ SOLN: 9.0000 mL | INTRAMUSCULAR | Status: DC | PRN

## 2011-07-17 MED ORDER — 0.9 % SODIUM CHLORIDE (POUR BTL) OPTIME
TOPICAL | Status: DC | PRN
  Administered 2011-07-17: 2000 mL

## 2011-07-17 MED ORDER — FENTANYL 10 MCG/ML IV SOLN
INTRAVENOUS | Status: DC
  Administered 2011-07-17: 22:00:00 via INTRAVENOUS
  Administered 2011-07-17 – 2011-07-18 (×3): 20 ug via INTRAVENOUS
  Administered 2011-07-19: 10 ug via INTRAVENOUS
  Administered 2011-07-19 (×2): 20 ug via INTRAVENOUS
  Administered 2011-07-20: 30 ug via INTRAVENOUS
  Filled 2011-07-17 (×2): qty 50

## 2011-07-17 MED ORDER — LEVALBUTEROL HCL 0.63 MG/3ML IN NEBU
0.6300 mg | INHALATION_SOLUTION | Freq: Four times a day (QID) | RESPIRATORY_TRACT | Status: DC
  Administered 2011-07-17 – 2011-07-22 (×17): 0.63 mg via RESPIRATORY_TRACT
  Filled 2011-07-17 (×24): qty 3

## 2011-07-17 MED ORDER — HEMOSTATIC AGENTS (NO CHARGE) OPTIME
TOPICAL | Status: DC | PRN
  Administered 2011-07-17: 1 via TOPICAL

## 2011-07-17 MED ORDER — BUPIVACAINE HCL 0.5 % IJ SOLN
INTRAMUSCULAR | Status: DC | PRN
  Administered 2011-07-17: 5 mL

## 2011-07-17 MED ORDER — ROCURONIUM BROMIDE 100 MG/10ML IV SOLN
INTRAVENOUS | Status: DC | PRN
  Administered 2011-07-17: 50 mg via INTRAVENOUS
  Administered 2011-07-17: 10 mg via INTRAVENOUS

## 2011-07-17 SURGICAL SUPPLY — 72 items
APPLICATOR TIP COSEAL (VASCULAR PRODUCTS) IMPLANT
APPLICATOR TIP EXT COSEAL (VASCULAR PRODUCTS) ×3 IMPLANT
BRUSH CYTOL CELLEBRITY 1.5X140 (MISCELLANEOUS) IMPLANT
CANISTER SUCTION 2500CC (MISCELLANEOUS) ×3 IMPLANT
CATH KIT ON Q 5IN SLV (PAIN MANAGEMENT) ×3 IMPLANT
CATH THORACIC 28FR (CATHETERS) IMPLANT
CATH THORACIC 36FR (CATHETERS) IMPLANT
CATH THORACIC 36FR RT ANG (CATHETERS) IMPLANT
CLIP TI MEDIUM 6 (CLIP) IMPLANT
CLOTH BEACON ORANGE TIMEOUT ST (SAFETY) ×3 IMPLANT
CONT SPEC 4OZ CLIKSEAL STRL BL (MISCELLANEOUS) ×12 IMPLANT
COVER TABLE BACK 60X90 (DRAPES) ×3 IMPLANT
DERMABOND ADVANCED (GAUZE/BANDAGES/DRESSINGS) ×1
DERMABOND ADVANCED .7 DNX12 (GAUZE/BANDAGES/DRESSINGS) ×2 IMPLANT
DRAPE LAPAROSCOPIC ABDOMINAL (DRAPES) ×3 IMPLANT
DRAPE SLUSH MACHINE 52X66 (DRAPES) IMPLANT
DRAPE SLUSH/WARMER DISC (DRAPES) IMPLANT
DRILL BIT 7/64X5 (BIT) ×3 IMPLANT
ELECT BLADE 4.0 EZ CLEAN MEGAD (MISCELLANEOUS) ×3
ELECT REM PT RETURN 9FT ADLT (ELECTROSURGICAL) ×3
ELECTRODE BLDE 4.0 EZ CLN MEGD (MISCELLANEOUS) ×2 IMPLANT
ELECTRODE REM PT RTRN 9FT ADLT (ELECTROSURGICAL) ×2 IMPLANT
FORCEPS BIOP RJ4 1.8 (CUTTING FORCEPS) IMPLANT
GLOVE BIO SURGEON STRL SZ 6.5 (GLOVE) ×6 IMPLANT
GOWN STRL NON-REIN LRG LVL3 (GOWN DISPOSABLE) ×12 IMPLANT
KIT BASIN OR (CUSTOM PROCEDURE TRAY) ×3 IMPLANT
KIT ROOM TURNOVER OR (KITS) ×3 IMPLANT
KIT SUCTION CATH 14FR (SUCTIONS) ×3 IMPLANT
LOOP VESSEL SUPERMAXI WHITE (MISCELLANEOUS) ×3 IMPLANT
MARKER SKIN DUAL TIP RULER LAB (MISCELLANEOUS) ×3 IMPLANT
NEEDLE BIOPSY TRANSBRONCH 21G (NEEDLE) IMPLANT
NS IRRIG 1000ML POUR BTL (IV SOLUTION) ×6 IMPLANT
OIL SILICONE PENTAX (PARTS (SERVICE/REPAIRS)) ×3 IMPLANT
PACK CHEST (CUSTOM PROCEDURE TRAY) ×3 IMPLANT
PAD ARMBOARD 7.5X6 YLW CONV (MISCELLANEOUS) ×6 IMPLANT
RELOAD EGIA 45 MED/THCK PURPLE (STAPLE) ×9 IMPLANT
RELOAD EGIA 45 TAN VASC (STAPLE) ×12 IMPLANT
RELOAD EGIA 60 MED/THCK PURPLE (STAPLE) ×6 IMPLANT
SEALANT PROGEL (MISCELLANEOUS) ×3 IMPLANT
SEALANT SURG COSEAL 4ML (VASCULAR PRODUCTS) IMPLANT
SEALANT SURG COSEAL 8ML (VASCULAR PRODUCTS) IMPLANT
SOLUTION ANTI FOG 6CC (MISCELLANEOUS) IMPLANT
SPONGE GAUZE 4X4 12PLY (GAUZE/BANDAGES/DRESSINGS) ×3 IMPLANT
STAPLER GIA TA80 3.8MM WHT SNG (STAPLE) ×3 IMPLANT
STAPLER TA30 4.8 NON-ABS (STAPLE) ×3 IMPLANT
SUT PROLENE 3 0 SH DA (SUTURE) IMPLANT
SUT PROLENE 4 0 RB 1 (SUTURE)
SUT PROLENE 4-0 RB1 .5 CRCL 36 (SUTURE) IMPLANT
SUT SILK  1 MH (SUTURE) ×3
SUT SILK 1 MH (SUTURE) ×6 IMPLANT
SUT SILK 2 0SH CR/8 30 (SUTURE) IMPLANT
SUT SILK 3 0SH CR/8 30 (SUTURE) IMPLANT
SUT VIC AB 1 CTX 18 (SUTURE) IMPLANT
SUT VIC AB 1 CTX 36 (SUTURE)
SUT VIC AB 1 CTX36XBRD ANBCTR (SUTURE) IMPLANT
SUT VIC AB 2-0 CTX 36 (SUTURE) IMPLANT
SUT VIC AB 2-0 UR6 27 (SUTURE) IMPLANT
SUT VIC AB 3-0 SH 8-18 (SUTURE) IMPLANT
SUT VIC AB 3-0 X1 27 (SUTURE) IMPLANT
SUT VICRYL 2 TP 1 (SUTURE) IMPLANT
SWAB COLLECTION DEVICE MRSA (MISCELLANEOUS) IMPLANT
SYR 20ML ECCENTRIC (SYRINGE) ×3 IMPLANT
SYSTEM SAHARA CHEST DRAIN ATS (WOUND CARE) ×3 IMPLANT
TIP APPLICATOR SPRAY EXTEND 16 (VASCULAR PRODUCTS) IMPLANT
TOWEL OR 17X24 6PK STRL BLUE (TOWEL DISPOSABLE) ×3 IMPLANT
TOWEL OR 17X26 10 PK STRL BLUE (TOWEL DISPOSABLE) ×6 IMPLANT
TRAP SPECIMEN MUCOUS 40CC (MISCELLANEOUS) ×6 IMPLANT
TRAY FOLEY CATH 14FRSI W/METER (CATHETERS) ×3 IMPLANT
TUBE ANAEROBIC SPECIMEN COL (MISCELLANEOUS) IMPLANT
TUBE CONNECTING 12X1/4 (SUCTIONS) ×3 IMPLANT
TUNNELER SHEATH ON-Q 11GX8 (MISCELLANEOUS) ×3 IMPLANT
WATER STERILE IRR 1000ML POUR (IV SOLUTION) ×6 IMPLANT

## 2011-07-17 NOTE — Anesthesia Postprocedure Evaluation (Signed)
Anesthesia Post Note  Patient: Samantha Quinn  Procedure(s) Performed: Procedure(s) (LRB): VIDEO ASSISTED THORACOSCOPY (VATS)/WEDGE RESECTION (Right) VIDEO BRONCHOSCOPY (N/A)  Anesthesia type: General  Patient location: PACU  Post pain: Pain level controlled and Adequate analgesia  Post assessment: Post-op Vital signs reviewed, Patient's Cardiovascular Status Stable, Respiratory Function Stable, Patent Airway and Pain level controlled  Last Vitals:  Filed Vitals:   07/17/11 1930  BP:   Pulse: 60  Temp:   Resp: 12    Post vital signs: Reviewed and stable  Level of consciousness: awake, alert  and oriented  Complications: No apparent anesthesia complications

## 2011-07-17 NOTE — Transfer of Care (Signed)
Immediate Anesthesia Transfer of Care Note  Patient: Samantha Quinn  Procedure(s) Performed: Procedure(s) (LRB): VIDEO ASSISTED THORACOSCOPY (VATS)/WEDGE RESECTION (Right) VIDEO BRONCHOSCOPY (N/A)  Patient Location: PACU  Anesthesia Type: General  Level of Consciousness: sedated  Airway & Oxygen Therapy: Patient Spontanous Breathing and Patient connected to face mask oxygen  Post-op Assessment: Report given to PACU RN and Post -op Vital signs reviewed and stable  Post vital signs: Reviewed and stable  Complications: No apparent anesthesia complications

## 2011-07-17 NOTE — Preoperative (Signed)
Beta Blockers   Reason not to administer Beta Blockers:Not Applicable 

## 2011-07-17 NOTE — Anesthesia Procedure Notes (Signed)
Procedure Name: Intubation Date/Time: 07/17/2011 1:00 PM Performed by: Sharlene Dory E Pre-anesthesia Checklist: Patient identified, Emergency Drugs available, Suction available, Patient being monitored and Timeout performed Patient Re-evaluated:Patient Re-evaluated prior to inductionOxygen Delivery Method: Circle system utilized Preoxygenation: Pre-oxygenation with 100% oxygen Intubation Type: IV induction Ventilation: Mask ventilation without difficulty Laryngoscope Size: Mac and 3 Grade View: Grade III Tube type: Oral Tube size: 8.0 mm Number of attempts: 1 Airway Equipment and Method: Stylet Placement Confirmation: ETT inserted through vocal cords under direct vision,  positive ETCO2,  CO2 detector and breath sounds checked- equal and bilateral Secured at: 23 cm Tube secured with: Tape Dental Injury: Teeth and Oropharynx as per pre-operative assessment

## 2011-07-17 NOTE — Anesthesia Preprocedure Evaluation (Addendum)
Anesthesia Evaluation  Patient identified by MRN, date of birth, ID band Patient awake, Patient confused and Patient unresponsive    Reviewed: Allergy & Precautions, H&P , NPO status , Patient's Chart, lab work & pertinent test results, reviewed documented beta blocker date and time   History of Anesthesia Complications (+) PONV  Airway Mallampati: III      Dental  (+) Teeth Intact   Pulmonary  breath sounds clear to auscultation        Cardiovascular hypertension, Pt. on home beta blockers     Neuro/Psych    GI/Hepatic   Endo/Other  Diabetes mellitus-, Well Controlled, Type 2Hypothyroidism   Renal/GU      Musculoskeletal   Abdominal   Peds  Hematology   Anesthesia Other Findings   Reproductive/Obstetrics                          Anesthesia Physical Anesthesia Plan  ASA: III  Anesthesia Plan: General   Post-op Pain Management:    Induction: Intravenous  Airway Management Planned: Oral ETT and Double Lumen EBT  Additional Equipment: Arterial line and CVP  Intra-op Plan:   Post-operative Plan: Extubation in OR  Informed Consent: I have reviewed the patients History and Physical, chart, labs and discussed the procedure including the risks, benefits and alternatives for the proposed anesthesia with the patient or authorized representative who has indicated his/her understanding and acceptance.   Dental advisory given  Plan Discussed with: CRNA  Anesthesia Plan Comments:        Anesthesia Quick Evaluation

## 2011-07-17 NOTE — H&P (Signed)
[address]            [place] [address]          [phone]         [facility] [facility] Medical Record #[id number] Date of Birth: 10/17/1927  Referring: No ref. provider found Primary Care: Dr Patty Sermons  Chief Complaint:    Right upper lobe lung mass   History of Present Illness:    Patient is an 78 year old female lifelong nonsmoker who had an chest x-ray done in [place] several  Weeks ago . She had orthopedic surgery the day prior, took an Ultram the morning after surgery and had a syncopal episode. Because of a syncopal episode she was admitted to [facility] and observed. Chest x-ray revealed opacity in the right upper lobe, further evaluation with a CT scan of the chest was performed. The patient notes that she had some upper respiratory tract infection symptoms in early January with cough several days of fever, but this had resolved the week prior to her orthopedic surgery.  PET scan was done and the patient was referred for needle biopsy of the right upper lobe lung lesion. She had complication of a pneumothorax requiring a chest tube for several days she was discharged home from the hospital today.  She returns to the hospital today for surgical resection of rt lung cancer .  Current Activity/ Functional Status: Patient is independent with mobility/ambulation, transfers, ADL's, IADL's.   Past Medical History  Diagnosis Date  . Diabetes mellitus   . Hyperlipidemia     takes Lovastatin every other day  . Hypercholesterolemia   . Dyslipidemia   . Myalgia   . Primary adenocarcinoma of lung 06/24/2011  . PONV (postoperative nausea and vomiting)   . Osteoarthritis   . Pneumothorax of right lung after biopsy 07/09/11    HAD CHEST TUBE PLACEMENT  . Hypertension     takes Ziac daily and Ramipril as well  . Hypothyroidism     takes Synthroid every other day  . Embolism - blood clot    hx of in left lower leg and was on Coumadin for about 75months;this was about 8-43yrs ago  . Lung mass   . Bronchitis     hx of-many,many years   . Upper respiratory infection 04/2011  . Hx of migraines     as a teenager  . Back pain     bulding disc  . Osteoporosis   . Joint pain   . Skin cancer     legs have dark spots on them  . History of colonic polyps   . Urinary incontinence     takes Detrol daily prn  . Diabetes mellitus     type 2 and takes Metformin bid  . Insomnia     takes Elavil nightly    Past Surgical History  Procedure Date  . Cholecystectomy   . Hemorrhoid surgery   . Colonoscopy   . Lung biopsy 07/09/11    RIGHT UPER LOBE LUNG MASS   . Right chest tube placement 07/10/11    S/P LUNG BX  . Chest tube  removal 07/13/11    RIGHT  CHEST TUBE  . Tonsillectomy     as a child  . Appendectomy     as a child  . Dilation and curettage of uterus     x 2  . Abdominal hysterectomy   . Rotator cuff repair     bilateral  . Toe surgery 2013    right small toe  . Colonoscopy     Family History  Problem Relation Age of Onset  . Arthritis Sister   . Arthritis Sister   . Diabetes Brother   . Hypertension Mother   . Anesthesia problems Neg Hx   . Hypotension Neg Hx   . Malignant hyperthermia Neg Hx   . Pseudochol deficiency Neg Hx     History   Social History  . Marital Status: Married    Spouse Name: N/A    Number of Children: N/A  . Years of Education: N/A    Social History Main Topics  . Smoking status: Never Smoker   . Smokeless tobacco: Not on file  . Alcohol Use: No  . Drug Use: No      History  Smoking status  . Never Smoker   Smokeless tobacco  . Not on file    History  Alcohol Use No     Allergies  Allergen Reactions  . Codeine   . Demerol     nausea  . Tramadol     syncope    Current Outpatient Prescriptions  Medication Sig Dispense Refill  . amitriptyline (ELAVIL) 10 MG tablet Take 5 mg by mouth at bedtime.      .  bisoprolol-hydrochlorothiazide (ZIAC) 2.5-6.25 MG per tablet TAKE ONE TABLET BY MOUTH EVERY DAY  90 tablet  3  . ibuprofen (ADVIL,MOTRIN) 200 MG tablet Take 200 mg by mouth as needed.       [name] levothyroxine (SYNTHROID, LEVOTHROID) 100 MCG tablet Take 100 mcg by mouth every other day.        . lovastatin (MEVACOR) 20 MG tablet Take 20 mg by mouth at bedtime. Taking every other day      . metFORMIN (GLUCOPHAGE) 500 MG tablet TAKE ONE TABLET BY MOUTH TWICE DAILY  180 tablet  3  . ramipril (ALTACE) 2.5 MG capsule TAKE ONE CAPSULE BY MOUTH EVERY DAY  90 capsule  3  . tolterodine (DETROL) 2 MG tablet 2 mg by Per post-pyloric tube route Daily. Per gyne      . vitamin C (ASCORBIC ACID) 500 MG tablet Take 500 mg by mouth daily.          Review of Systems:     Cardiac Review of Systems: Y or N  Chest Pain [ n   ]  Resting SOB [ n  ] Exertional SOB  [ n ]  Orthopnea [n  ]   Pedal Edema [  n ]    Palpitations [  ] Syncope  [ y ]   Presyncope [n ]  General Review of Systems: [Y] = yes [  ]=no Constitional: recent weight change [  ]; anorexia [  ]; fatigue [  ]; nausea [  ]; night sweats [  ]; fever [  ]; or chills [  ];  Dental: poor dentition[  n];   Eye : blurred vision [  ]; diplopia [   ]; vision changes [  ];  Amaurosis fugax[  ]; Resp: cough [  ];  wheezing[  ];  hemoptysis[  ]; shortness of breath[  ]; paroxysmal nocturnal dyspnea[  ]; dyspnea on exertion[  ]; or orthopnea[  ];  GI:  gallstones[  ], vomiting[  ];  dysphagia[  ]; melena[  ];  hematochezia [  ]; heartburn[  ];   Hx of  Colonoscopy[  ]; GU: kidney stones [  ]; hematuria[  ];   dysuria [  ];  nocturia[  ];  history of     obstruction [  ];             Skin: rash, swelling[  ];, hair loss[  ];  peripheral edema[  ];  or itching[  ]; Musculosketetal: myalgias[  ];  joint swelling[  ];  joint erythema[   ];  joint pain[  ];  back pain[  ];  Heme/Lymph: bruising[  ];  bleeding[  ];  anemia[  ];  Neuro: TIA[  ];  headaches[  ];  stroke[ n ];  vertigo[y  ];  seizures[ n ];   paresthesias[  n];  difficulty walking[ n ];  Psych:depression[ n ]; anxiety[ n ];  Endocrine: diabetes[  ];  thyroid dysfunction[ n ];  Immunizations: Flu [?  ]; Pneumococcal[  ?];  Other:  Physical Exam: BP 131/61  Pulse 66  Temp(Src) 98.2 F (36.8 C) (Oral)  Resp 16  SpO2 100%  General appearance: alert, cooperative, appears stated age and no distress Neurologic: intact Heart: regular rate and rhythm, S1, S2 normal, no murmur, click, rub or gallop Lungs: clear to auscultation bilaterally Abdomen: soft, non-tender; bowel sounds normal; no masses,  no organomegaly Extremities: extremities normal, atraumatic, no cyanosis or edema and Homans sign is negative, no sign of DVT rt foot in support boot   Diagnostic Studies & Laboratory data:     Recent Radiology Findings:  Dg Chest 2 View  07/16/2011  *RADIOLOGY REPORT*  Clinical Data: Preoperative evaluation.  History of adenocarcinoma of the lung.  CHEST - 2 VIEW  Comparison: 07/13/2011.  Findings: The right upper lobe mass opacity appears stable.  No pneumothorax is evident.  No pulmonary infiltrates are seen on the left or in the mid and lower right lung areas.  No pleural effusion is seen. Ectasia and nonaneurysmal calcification of the thoracic aorta are seen.  There is mild thoracolumbar scoliosis. Cholecystectomy clips and right shoulder surgical clips are present. Stable normal appearance of cardiac silhouette.  IMPRESSION: Stable appearance of abnormal mass opacity in right upper lobe.  No evidence of pneumothorax.  No new or acute pulmonary infiltrative densities are evident.  Original Report Authenticated By: Crawford Givens, M.D.   Dg Chest 2 View  06/18/2011  *RADIOLOGY REPORT*  Clinical Data: Abnormal chest x-ray  CHEST - 2 VIEW  Comparison: [place] study  06/13/2011.  Findings: Right apical density is again noted.  This density may be slightly less pronounced than on prior study.  I would recommend continued follow up to see if this resolves further.  Malignancy still is not excluded.  Remainder of the lungs are clear.  Heart is normal size.  No effusions.  IMPRESSION: Possible slight improvement in the right apical mass-like density. Recommend continued close follow-up.  Malignancy is still not excluded.  Original Report Authenticated By: Cyndie Chime, M.D.  Mr Laqueta Jean Wo Contrast  07/03/2011  *RADIOLOGY REPORT*  Clinical Data: Recent diagnosis of lung cancer.  Staging.  MRI HEAD WITHOUT AND WITH CONTRAST  Technique:  Multiplanar, multiecho pulse sequences of the brain and surrounding structures were obtained according to standard protocol without and with intravenous contrast  Contrast: 11mL MULTIHANCE GADOBENATE DIMEGLUMINE 529 MG/ML IV SOLN  Comparison: 08/08/2009  Findings: The brain has a normal appearance for age.  There is mild age related atrophy without evidence of old or acute infarction, mass lesion, hemorrhage, hydrocephalus or extra-axial collection. No pituitary mass.  No skull or skull base lesion.  Sinuses are clear.  Major vascular structures are patent.  IMPRESSION: Normal examination for age.  No evidence of metastatic disease.  Original Report Authenticated By: Thomasenia Sales, M.D.   Nm Pet Image Initial (pi) Skull Base To Thigh  07/03/2011  *RADIOLOGY REPORT*  Clinical Data:  Initial treatment strategy for lung mass.  NUCLEAR MEDICINE PET CT INITIAL (PI) SKULL BASE TO THIGH  Technique:  16.4 mCi F-18 FDG was injected intravenously via the left antecubital.  Full-ring PET imaging was performed from the skull base through the mid-thighs 16  minutes after injection.  CT data was obtained and used for attenuation correction and anatomic localization only.  (This was not acquired as a diagnostic CT examination.)  Fasting Blood Glucose:  99   Patient Weight:  128 pounds.  Comparison:  Chest CT 06/13/2011.  Findings: The right upper lobe lung mass demonstrates low level FDG uptake with SUV max of 4.0.  This is likely neoplasm.  No FDG positive mediastinal or hilar lymph nodes.  No supraclavicular or axillary adenopathy.  No other pulmonary nodules.  No pleural effusion.  No findings for metastatic disease involving the abdomen or pelvis. There is a benign appearing rim calcified cyst in the spleen.  No areas of osseous uptake to suggest metastatic disease.  No neck adenopathy.  The visualized portion of the brain is grossly normal.  The thyroid goiter is noted.  No abnormal FDG uptake.  IMPRESSION:  1.  Right upper lobe lung mass has neoplastic range FDG uptake but still possibly could be a chronic inflammatory process giving the adjacent bronchiectasis.  Biopsy is recommended. 2.  No mediastinal or hilar lymphadenopathy and no findings for metastatic disease.  Original Report Authenticated By: P. Loralie Champagne, M.D.    Ct scan from [facility] reviewed ; Rt upper lobe mass 4cm with medial air bronchgrams more solid laterialy RADIOLOGY REPORT*  Clinical Data: Pneumonia post right foot surgery yesterday.  CT CHEST WITH CONTRAST  Technique: Multidetector CT imaging of the chest was performed following the standard protocol during bolus administration of intravenous contrast.  Contrast: 60 ml Isovue 370  Comparison: Acute abdomen series of 06/13/2011. No prior CT.[name]  Findings: Lung windows demonstrate mass-like opacity within the posterior right upper lobe, corresponding to the plain film abnormality. This measures 4.1 x 3.8 cm on image 11 transverse and 3.8 cm cranial caudal on sagittal image 44. Laterally, this is absent air bronchograms. There are air bronchograms medially. Minimal surrounding ground-glass opacity. Pleural contact identified anteriorly and superolaterally.  The left lung is clear.  Soft tissue windows demonstrate  enlargement and heterogeneity of the right lobe of the thyroid. The most well-defined nodule measures 1.2 cm on image 7 inferiorly.  Tortuous thoracic aorta. Heart size upper normal, without pericardial or pleural effusion. No central pulmonary embolism, on this non-dedicated study. No mediastinal or hilar adenopathy.  Limited abdominal  imaging demonstrates peripherally calcified cystic lesion within the spleen which measures 5.9 cm. Normal adrenal glands. Mild osteopenia. Prior right rotator cuff repair. No acute osseous abnormality.  IMPRESSION:  1. Right upper lobe/apical mass-like opacity. Suspicious for primary bronchogenic carcinoma. There are air bronchograms medially and mild surrounding ground-glass opacity. Therefore, an area of infection is possible. If there are infectious symptoms, antibiotic therapy and short-term radiographic follow-up recommended. If no infectious symptoms, thoracic surgery consultation and eventual PET suggested. 2. No evidence of adenopathy to suggest nodal spread. 3. Splenic subcapsular calcified lesion is likely the sequelae of prior trauma or infection. 4. Heterogeneously enlarged right lobe of the thyroid. Consider correlation with ultrasound.       Recent Lab Findings: Lab Results  Component Value Date   WBC 6.9 07/16/2011   HGB 11.8* 07/16/2011   HCT 35.5* 07/16/2011   PLT 295 07/16/2011   GLUCOSE 109* 07/16/2011   CHOL 162 02/27/2011   TRIG 112.0 02/27/2011   HDL 53.20 02/27/2011   LDLCALC 86 02/27/2011   ALT 16 07/16/2011   AST 18 07/16/2011   NA 137 07/16/2011   K 4.1 07/16/2011   CL 103 07/16/2011   CREATININE 0.45* 07/16/2011   BUN 17 07/16/2011   CO2 21 07/16/2011   TSH 0.01* 02/27/2011   INR 0.96 07/16/2011   Lung, biopsy, right upper mass Path [id number] - POSITIVE FOR ADENOCARCINOMA.   Assessment / Plan:     Newly discovered right upper lobe lung lesion  with some infiltrative component and more solid component, now biopsy  proven adenocarcinoma right upper lobe approximately 4 cm in size. The  true size of the tumor is difficult to determine with the infiltrative process present on the CT scan.  With the patient's syncopal episode we  obtained a MRI both for evaluation of the syncopal episode and also to rule out the possibility of brain metastasis which is negative for Metastatic disease   I discussed with  and recommended  to the patient and her daughters surgical treatment, probable right upper lobectomy considering the size of the tumor. It appears to to be clinical stage I disease. The patient has good respiratory reserve by history, never been a smoker,  formal PFTs are complete. The risks and options of surgical treatment were discussed in detail. ( more then 45 min review of options in 83ffice today).  she is now sure she wants to proceed with surgical resection. She  Talked  to radiation oncology about the radiation options, yesterday.   The goals risks and alternatives of the planned surgical procedure rt upper lobectomy  have been discussed with the patient in detail. The risks of the procedure including death, infection, stroke, myocardial infarction, bleeding, blood transfusion have all been discussed specifically.  I have quoted [name] a 4 % of perioperative mortality and a complication rate as high as 20 %. The patient's questions have been answered.IREENE BALLOWE is willing  to proceed with the planned procedure.  Delight Ovens MD  Beeper [phone] Office [phone] 07/17/2011 11:42 AM

## 2011-07-17 NOTE — Brief Op Note (Signed)
07/17/2011  5:05 PM  PATIENT:  Samantha Quinn  78 y.o. female  PRE-OPERATIVE DIAGNOSIS: Right Upper Lobe Lung Ca by Needle BX POST-OPERATIVE DIAGNOSIS:  Same  PROCEDURE:  Procedure(s) (LRB):VIDEO BRONCHOSCOPY (N/A) VIDEO ASSISTED THORACOSCOPY (VATS)/Right Upper Lobectomy and node dissection   SURGEON:  Surgeon(s)      Delight Ovens, MD  Asst : Denny Peon Barrett PA   ANESTHESIA:   general  EBL:  Total I/O In: 2050 [I.V.:1800; IV Piggyback:250] Out: 1150 [Urine:900; Blood:250]  BLOOD ADMINISTERED:none  DRAINS: Urinary Catheter (Foley) and two Chest Tube(s) in the right chest   LOCAL MEDICATIONS USED: .5 bipvercaine in ON Q device  SPECIMEN:  Source of Specimen:  right upper lobe  DISPOSITION OF SPECIMEN:  PATHOLOGY  COUNTS:  YES   DICTATION: .Other Dictation: Dictation Number   PLAN OF CARE: Admit to inpatient   PATIENT DISPOSITION:  PACU - hemodynamically stable.   Delay start of Pharmacological VTE agent (>24hrs) due to surgical blood loss or risk of bleeding: yes

## 2011-07-18 ENCOUNTER — Inpatient Hospital Stay (HOSPITAL_COMMUNITY): Payer: Medicare Other

## 2011-07-18 DIAGNOSIS — J9819 Other pulmonary collapse: Secondary | ICD-10-CM | POA: Diagnosis not present

## 2011-07-18 DIAGNOSIS — E119 Type 2 diabetes mellitus without complications: Secondary | ICD-10-CM | POA: Diagnosis not present

## 2011-07-18 DIAGNOSIS — Z09 Encounter for follow-up examination after completed treatment for conditions other than malignant neoplasm: Secondary | ICD-10-CM | POA: Diagnosis not present

## 2011-07-18 LAB — CBC
Hemoglobin: 9 g/dL — ABNORMAL LOW (ref 12.0–15.0)
MCH: 28.5 pg (ref 26.0–34.0)
MCV: 85.4 fL (ref 78.0–100.0)
RBC: 3.16 MIL/uL — ABNORMAL LOW (ref 3.87–5.11)

## 2011-07-18 LAB — GLUCOSE, CAPILLARY
Glucose-Capillary: 107 mg/dL — ABNORMAL HIGH (ref 70–99)
Glucose-Capillary: 129 mg/dL — ABNORMAL HIGH (ref 70–99)
Glucose-Capillary: 248 mg/dL — ABNORMAL HIGH (ref 70–99)
Glucose-Capillary: 112 mg/dL — ABNORMAL HIGH (ref 70–99)

## 2011-07-18 LAB — BLOOD GAS, ARTERIAL
Acid-Base Excess: 0.5 mmol/L (ref 0.0–2.0)
Drawn by: 347621
TCO2: 26 mmol/L (ref 0–100)
pCO2 arterial: 40.3 mmHg (ref 35.0–45.0)
pO2, Arterial: 138 mmHg — ABNORMAL HIGH (ref 80.0–100.0)

## 2011-07-18 LAB — BASIC METABOLIC PANEL
CO2: 24 mEq/L (ref 19–32)
GFR calc non Af Amer: >90 mL/min (ref >90)
Glucose, Bld: 129 mg/dL — ABNORMAL HIGH (ref 70–99)
Potassium: 3.8 mEq/L (ref 3.5–5.1)
Sodium: 132 mEq/L — ABNORMAL LOW (ref 135–145)

## 2011-07-18 MED ORDER — WHITE PETROLATUM GEL
Status: AC
  Administered 2011-07-18: 02:00:00
  Filled 2011-07-18: qty 5

## 2011-07-18 MED ORDER — SODIUM CHLORIDE 0.9 % IJ SOLN
INTRAMUSCULAR | Status: AC
  Filled 2011-07-18: qty 30

## 2011-07-18 NOTE — Anesthesia Postprocedure Evaluation (Signed)
  Anesthesia Post-op Note  Patient: Samantha Quinn  Procedure(s) Performed: Procedure(s) (LRB): VIDEO ASSISTED THORACOSCOPY (VATS)/WEDGE RESECTION (Right) VIDEO BRONCHOSCOPY (N/A)  Patient Location: SICU  Anesthesia Type: General  Level of Consciousness: awake, alert  and oriented  Airway and Oxygen Therapy: Patient Spontanous Breathing and Patient connected to nasal cannula oxygen  Post-op Pain: mild  Post-op Assessment: Post-op Vital signs reviewed, Patient's Cardiovascular Status Stable, Respiratory Function Stable, Patent Airway, No signs of Nausea or vomiting and Pain level controlled  Post-op Vital Signs: Reviewed and stable  Complications: No apparent anesthesia complications

## 2011-07-18 NOTE — Progress Notes (Addendum)
[address]            [place] [address]          [phone]     1 Day Post-Op Procedure(s) (LRB): VIDEO ASSISTED THORACOSCOPY (VATS)/WEDGE RESECTION (Right) VIDEO BRONCHOSCOPY (N/A)  Subjective: OOB to chair.  Ate a good breakfast.  Sore, but otherwise feeling well.  Objective: Vital signs in last 24 hours: Patient Vitals for the past 24 hrs:  BP Temp Temp src Pulse Resp SpO2 Height Weight  07/18/11 0836 113/53 mmHg 98.2 F (36.8 C) Oral 74  17  100 % - -  07/18/11 0804 - - - - - 100 % - -  07/18/11 0446 - - - - 17  100 % - -  07/18/11 0440 108/45 mmHg - - - - - - -  07/18/11 0400 - 97.9 F (36.6 C) Oral - - - - -  07/18/11 0133 - - - - - 100 % - -  07/17/11 2351 118/53 mmHg 97.6 F (36.4 C) Oral - - - - -  07/17/11 2332 - - - - 18  100 % - -  07/17/11 2326 120/51 mmHg - - - - - - -  07/17/11 2210 - - - - 13  100 % - -  07/17/11 2204 - - - - - 100 % - -  07/17/11 2119 - - - - - - 5\' 1"  (1.549 m) 62.6 kg (138 lb 0.1 oz)  07/17/11 2100 135/79 mmHg 97.7 F (36.5 C) Oral - - - - -  07/17/11 2010 137/64 mmHg - - - - - - -  07/17/11 2000 - - - 60  9  100 % - -  07/17/11 1945 - - - 67  14  100 % - -  07/17/11 1940 128/67 mmHg - - - - - - -  07/17/11 1930 - - - 60  12  100 % - -  07/17/11 1925 133/52 mmHg - - - - - - -  07/17/11 1915 - - - 59  11  100 % - -  07/17/11 1910 134/48 mmHg - - - - - - -  07/17/11 1900 - - - 60  11  100 % - -  07/17/11 1855 135/50 mmHg - - - - - - -  07/17/11 1845 - - - 56  10  100 % - -  07/17/11 1840 126/59 mmHg - - - - - - -  07/17/11 1830 - 96.8 F (36 C) - 56  12  100 % - -  07/17/11 1825 134/51 mmHg - - - - - - -  07/17/11 1815 - - - 52  12  100 % - -  07/17/11 1810 155/58 mmHg - - - - - - -  07/17/11 1800 - - - 52  15  100 % - -  07/17/11 1755 160/56 mmHg - - - - - - -  07/17/11 1745 - - - 56  17  100 % - -  07/17/11 1740 162/54 mmHg - - - - - - -  07/17/11 1730 - - - 50  13  100 % - -    07/17/11 1725 153/51 mmHg - - - - - - -  07/17/11 1715 - - - 54  15  100 % - -  07/17/11 1710 158/57 mmHg - - - - - - -  07/17/11 1700 - - - 54  16  100 % - -  07/17/11 1657 - - - 57  14  100 % - -  07/17/11 1655 165/66 mmHg - - 60  23  100 % - -  07/17/11 1650 - 96.8 F (36 C) - - - - - -  07/17/11 1217 - - - 68  15  100 % - -  07/17/11 1216 - - - 67  17  100 % - -  07/17/11 1215 - - - 67  18  100 % - -  07/17/11 1213 - - - 72  21  100 % - -  07/17/11 1212 - - - 68  18  100 % - -   Current Weight  07/17/11 62.6 kg (138 lb 0.1 oz)     Intake/Output from previous day: 03/22 0701 - 03/23 0700 In: 4500 [I.V.:4250; IV Piggyback:250] Out: 2292 [Urine:1520; Blood:250; Chest Tube:522]  CBGs 133-147-129-107  PHYSICAL EXAM:  Heart: RRR Lungs: decreased BS in bases bilaterally Wound: dressed and dry Chest tube: no air  leak  Lab Results: CBC: Basename 07/18/11 0400 07/16/11 0948  WBC 10.8* 6.9  HGB 9.0* 11.8*  HCT 27.0* 35.5*  PLT 263 295   BMET:  Basename 07/18/11 0400 07/16/11 0948  NA 132* 137  K 3.8 4.1  CL 101 103  CO2 24 21  GLUCOSE 129* 109*  BUN 11 17  CREATININE 0.47* 0.45*  CALCIUM 7.9* 10.0    PT/INR:  Basename 07/16/11 0948  LABPROT 13.0  INR 0.96   CXR- no obvious ptx  Assessment/Plan: S/P Procedure(s) (LRB): VIDEO ASSISTED THORACOSCOPY (VATS)/WEDGE RESECTION (Right) VIDEO BRONCHOSCOPY (N/A) Chest tube no air leak. May be able to decrease CTs to water seal. BPs trending up- will resume home meds. DM- stable CBGs.  Back on home meds.  Continue SSI. Decrease IVF, mobilize, pulm toilet.    LOS: 1 day    COLLINS,GINA H 07/18/2011   I have seen and examined the patient and agree with the assessment and plan as outlined.  Looks good.  Will d/c anterior tube and place to H20 seal tomorrow.  Purcell Nails 07/18/2011 2:33 PM

## 2011-07-18 NOTE — Progress Notes (Signed)
Patients Arterial Line blood pressure reading dropped to 88/35;  Manual cuff pressure 94/50; paged Dr. Cornelius Moras.  Was told to continue monitoring patient closely.    Vivi Martens RN

## 2011-07-19 ENCOUNTER — Inpatient Hospital Stay (HOSPITAL_COMMUNITY): Payer: Medicare Other

## 2011-07-19 DIAGNOSIS — J9819 Other pulmonary collapse: Secondary | ICD-10-CM | POA: Diagnosis not present

## 2011-07-19 LAB — GLUCOSE, CAPILLARY
Glucose-Capillary: 143 mg/dL — ABNORMAL HIGH (ref 70–99)
Glucose-Capillary: 164 mg/dL — ABNORMAL HIGH (ref 70–99)
Glucose-Capillary: 154 mg/dL — ABNORMAL HIGH (ref 70–99)
Glucose-Capillary: 134 mg/dL — ABNORMAL HIGH (ref 70–99)

## 2011-07-19 LAB — COMPREHENSIVE METABOLIC PANEL
ALT: 10 U/L (ref 0–35)
AST: 11 U/L (ref 0–37)
Albumin: 2.8 g/dL — ABNORMAL LOW (ref 3.5–5.2)
CO2: 24 mEq/L (ref 19–32)
Calcium: 8.4 mg/dL (ref 8.4–10.5)
Creatinine, Ser: 0.45 mg/dL — ABNORMAL LOW (ref 0.50–1.10)
GFR calc non Af Amer: >90 mL/min (ref >90)
Sodium: 138 mEq/L (ref 135–145)
Total Protein: 5.5 g/dL — ABNORMAL LOW (ref 6.0–8.3)

## 2011-07-19 LAB — CBC
MCH: 28.4 pg (ref 26.0–34.0)
MCHC: 33 g/dL (ref 30.0–36.0)
MCV: 86.1 fL (ref 78.0–100.0)
Platelets: 241 10*3/uL (ref 150–400)
RDW: 14.5 % (ref 11.5–15.5)

## 2011-07-19 NOTE — Progress Notes (Addendum)
                    [address]            [place] [address]          [phone]     2 Days Post-Op Procedure(s) (LRB): VIDEO ASSISTED THORACOSCOPY (VATS)/WEDGE RESECTION (Right) VIDEO BRONCHOSCOPY (N/A)  Subjective: Attempted to walk in hall this am.  Made it about 25 ft and became nauseated.  Feels better now.  Breathing stable.    Objective: Vital signs in last 24 hours: Patient Vitals for the past 24 hrs:  BP Temp Temp src Pulse Resp SpO2  07/19/11 0720 144/58 mmHg 97.8 F (36.6 C) Oral 93  21  95 %  07/19/11 0532 - - - 77  20  100 %  07/19/11 0522 143/61 mmHg 98.2 F (36.8 C) Oral 86  22  91 %  07/19/11 0400 - - - - 17  92 %  07/19/11 0000 - - - 77  18  91 %  07/18/11 2300 132/47 mmHg 98.3 F (36.8 C) Oral 77  18  93 %  07/18/11 2002 - - - - - 96 %  07/18/11 2000 - - - 71  26  98 %  07/18/11 1600 122/51 mmHg 98.6 F (37 C) Oral - 19  98 %  07/18/11 1458 - - - - - 98 %  07/18/11 1200 139/49 mmHg 98.6 F (37 C) Oral 77  24  92 %  07/18/11 1032 129/46 mmHg - - - - -  07/18/11 0836 113/53 mmHg 98.2 F (36.8 C) Oral 74  17  100 %  07/18/11 0804 - - - - - 100 %   Current Weight  07/17/11 62.6 kg (138 lb 0.1 oz)     Intake/Output from previous day: 03/23 0701 - 03/24 0700 In: 1308.2 [I.V.:1308.2] Out: 1735 [Urine:1375; Chest Tube:360]  CBGs 248-112-127  PHYSICAL EXAM:  Heart: RRR Lungs: diminished BS in bases bilaterally Wound: clean and dry Chest tube: no air leak  Lab Results: CBC: Basename 07/19/11 0526 07/18/11 0400  WBC 9.9 10.8*  HGB 8.8* 9.0*  HCT 26.7* 27.0*  PLT 241 263   BMET:  Basename 07/19/11 0526 07/18/11 0400  NA 138 132*  K 3.9 3.8  CL 105 101  CO2 24 24  GLUCOSE 127* 129*  BUN 6 11  CREATININE 0.45* 0.47*  CALCIUM 8.4 7.9*    PT/INR:  Basename 07/16/11 0948  LABPROT 13.0  INR 0.96   CXR stable atx, no obvious ptx  Assessment/Plan: S/P Procedure(s) (LRB): VIDEO ASSISTED THORACOSCOPY (VATS)/WEDGE  RESECTION (Right) VIDEO BRONCHOSCOPY (N/A) Will d/c anterior CT today and place remaining CT to H2O seal. ABL anemia- monitor.  H/H down some today. Will hold off on starting Fe since she is nauseated. Continue pulm toilet/IS, ambulation. CBGs a bit elevated. Continue SSI, po meds. BPs better on home meds.   LOS: 2 days    COLLINS,GINA H 07/19/2011   I have seen and examined the patient and agree with the assessment and plan as outlined.  Jiali Linney H 07/19/2011 1:05 PM

## 2011-07-20 ENCOUNTER — Encounter (HOSPITAL_COMMUNITY): Payer: Self-pay | Admitting: Cardiothoracic Surgery

## 2011-07-20 ENCOUNTER — Other Ambulatory Visit (HOSPITAL_COMMUNITY): Payer: Medicare Other

## 2011-07-20 ENCOUNTER — Inpatient Hospital Stay (HOSPITAL_COMMUNITY): Payer: Medicare Other

## 2011-07-20 DIAGNOSIS — Z09 Encounter for follow-up examination after completed treatment for conditions other than malignant neoplasm: Secondary | ICD-10-CM | POA: Diagnosis not present

## 2011-07-20 DIAGNOSIS — J9819 Other pulmonary collapse: Secondary | ICD-10-CM | POA: Diagnosis not present

## 2011-07-20 DIAGNOSIS — R918 Other nonspecific abnormal finding of lung field: Secondary | ICD-10-CM | POA: Diagnosis not present

## 2011-07-20 LAB — GLUCOSE, CAPILLARY
Glucose-Capillary: 124 mg/dL — ABNORMAL HIGH (ref 70–99)
Glucose-Capillary: 116 mg/dL — ABNORMAL HIGH (ref 70–99)
Glucose-Capillary: 159 mg/dL — ABNORMAL HIGH (ref 70–99)
Glucose-Capillary: 113 mg/dL — ABNORMAL HIGH (ref 70–99)

## 2011-07-20 LAB — CBC
HCT: 25.5 % — ABNORMAL LOW (ref 36.0–46.0)
MCH: 28.4 pg (ref 26.0–34.0)
MCV: 86.1 fL (ref 78.0–100.0)
RBC: 2.96 MIL/uL — ABNORMAL LOW (ref 3.87–5.11)
RDW: 14.5 % (ref 11.5–15.5)
WBC: 9.3 10*3/uL (ref 4.0–10.5)

## 2011-07-20 MED ORDER — ACETAMINOPHEN 325 MG PO TABS
650.0000 mg | ORAL_TABLET | Freq: Four times a day (QID) | ORAL | Status: DC | PRN
  Administered 2011-07-20: 650 mg via ORAL
  Filled 2011-07-20: qty 1
  Filled 2011-07-20: qty 2

## 2011-07-20 MED ORDER — OXYCODONE-ACETAMINOPHEN 5-325 MG PO TABS
1.0000 | ORAL_TABLET | Freq: Four times a day (QID) | ORAL | Status: DC | PRN
Start: 2011-07-20 — End: 2011-07-20

## 2011-07-20 MED ORDER — ACETAMINOPHEN 500 MG PO TABS
1000.0000 mg | ORAL_TABLET | Freq: Four times a day (QID) | ORAL | Status: DC | PRN
  Administered 2011-07-20 – 2011-07-21 (×2): 1000 mg via ORAL
  Filled 2011-07-20 (×2): qty 2

## 2011-07-20 MED ORDER — OXYCODONE-ACETAMINOPHEN 5-325 MG PO TABS: 1.0000 | ORAL_TABLET | Freq: Four times a day (QID) | ORAL | Status: DC | PRN

## 2011-07-20 MED ORDER — LACTULOSE 10 GM/15ML PO SOLN
20.0000 g | Freq: Once | ORAL | Status: AC
  Administered 2011-07-20: 20 g via ORAL
  Filled 2011-07-20: qty 30

## 2011-07-20 NOTE — Progress Notes (Signed)
Pt TX to 2037, VSS, NSL, CT d/c

## 2011-07-20 NOTE — Progress Notes (Addendum)
3 Days Post-Op Procedure(s) (LRB): VIDEO ASSISTED THORACOSCOPY (VATS)/WEDGE RESECTION (Right) VIDEO BRONCHOSCOPY (N/A)  Subjective: Patient passing flatus but no bm yet. Nausea now resolved.  Objective: Vital signs in last 24 hours: Patient Vitals for the past 24 hrs:  BP Temp Temp src Pulse Resp SpO2  07/20/11 0400 - - - - 19  96 %  07/20/11 0320 127/59 mmHg 98.2 F (36.8 C) Oral 86  18  94 %  07/20/11 0000 - - - 92  20  96 %  07/19/11 2310 126/59 mmHg 98.6 F (37 C) Oral 96  18  92 %  07/19/11 2019 147/59 mmHg - - - - -  07/19/11 2007 - - - - 16  97 %  07/19/11 2000 147/59 mmHg 99.4 F (37.4 C) Oral 98  22  97 %  07/19/11 1600 155/58 mmHg 98.3 F (36.8 C) Oral 106  29  98 %  07/19/11 1457 - - - - - 96 %  07/19/11 1147 - - - - 25  94 %  07/19/11 1142 152/67 mmHg 98.2 F (36.8 C) Oral 108  25  94 %  07/19/11 1040 144/55 mmHg - - - - -  07/19/11 0800 - - - 90  25  95 %    Current Weight  07/17/11 138 lb 0.1 oz (62.6 kg)        Intake/Output from previous day: 03/24 0701 - 03/25 0700 In: 1440 [P.O.:240; I.V.:1200] Out: 2510 [Urine:2300; Chest Tube:210]   Physical Exam:  Cardiovascular: RRR, no murmurs, gallops, or rubs. Pulmonary: Diminished at bases; no rales, wheezes, or rhonchi. Abdomen: Soft, non tender, bowel sounds present. Extremities: No  lower extremity edema. Wounds: Dressing clean and dry.    Lab Results: CBC: Basename 07/20/11 0352 07/19/11 0526  WBC 9.3 9.9  HGB 8.4* 8.8*  HCT 25.5* 26.7*  PLT 221 241   BMET:  Basename 07/19/11 0526 07/18/11 0400  NA 138 132*  K 3.9 3.8  CL 105 101  CO2 24 24  GLUCOSE 127* 129*  BUN 6 11  CREATININE 0.45* 0.47*  CALCIUM 8.4 7.9*    PT/INR: No results found for this basename: LABPROT,INR in the last 72 hours ABG:  INR: Will add last result for INR, ABG once components are confirmed Will add last 4 CBG results once components are confirmed  Assessment/Plan:  1.Pulmonary-CXR shows atelectasis R>L,  questionable trace right apical pneumothorax, small effusions R>L, and cardiomegaly.Chest tube with 210 cc for last 24 hours.No air leak and chest tube is to water seal. Possibly remove today. 2.ABL anemia-H/H this am 8.4/25.5. 3.DM-CBGs 164/154/134.Continue with Metformin. 4.Remove On Q 5.LOC constipation.   ZIMMERMAN,DONIELLE MPA-C 07/20/2011   Plan remove ct today Dc on Q Path pending I have seen and examined Samantha Quinn and agree with the above assessment  and plan.  Delight Ovens MD Beeper [phone] Office [phone] 07/20/2011 8:10 AM   Path : pT2a, pN0, M0 non small cell lung Ca

## 2011-07-20 NOTE — Progress Notes (Signed)
Wasted 20 ml fentanyl PCA; witness Rhae Hammock, RN

## 2011-07-20 NOTE — Discharge Instructions (Signed)
ACTIVITY:  1.Increase activity slowly. 2.Walk daily and increase frequency and duration as tolerates. 3.May walk up steps. 4.No lifting more than ten pounds for two weeks. 5.No driving for two weeks. 6.Avoid straining. 7.STOP any activity that causes chest pain, shortness of breath, dizziness,sweating, or  excessive weakness. 8.Continue with breathing exercises daily.  DIET:Diabetic,low salt, low fat  WOUND:  1.May shower. 2.Clean wounds with mild soap and water.  Call the office at [phone] if any problems arise.  Thoracotomy Care After Refer to this sheet in the next few weeks. These instructions provide you with information on caring for yourself after your procedure. Your caregiver may also give you more specific instructions. Your treatment has been planned according to current medical practices, but problems sometimes occur. Call your caregiver if you have any problems or questions after your procedure. HOME CARE INSTRUCTIONS  Remove the bandage (dressing) over your chest tube site as directed by your caregiver.   It is normal to be sore for a couple weeks following surgery. See your caregiver if this seems to be getting worse rather than better.   Only take over-the-counter or prescription medicines for pain, discomfort, or fever as directed by your caregiver. It is very important to take pain medicine when you need it so that you will cough and breathe deeply enough to clear mucus (phlegm) and expand your lungs. This helps prevent a lung infection (pneumonia).   If it hurts to cough, hold a pillow against your chest when you cough. This may help with the discomfort. In spite of the discomfort, cough frequently.   Taking deep breaths keeps lungs inflated and protects against pneumonia. Most patients will go home with a device called an incentive spirometer that encourages deep breathing.   You may resume a normal diet and activities as directed.   Use showers for bathing  until you see your caregiver, or as instructed.   Change dressings if necessary or as directed.   Avoid lifting or driving until you are instructed otherwise.   Make an appointment to see your caregiver for stitch (suture) or staple removal when instructed.   Do not travel by airplane for 2 weeks after the chest tube is removed.  SEEK MEDICAL CARE IF:  You are bleeding from your wounds.   Your heartbeat seems irregular.   You have redness, swelling, or increasing pain in the wounds.   There is pus coming from your wounds.   There is a bad smell coming from the wound or dressing.  SEEK IMMEDIATE MEDICAL CARE IF:  You have a fever.   You develop a rash.   You have difficulty breathing.   You develop any reaction or side effects to medicines given.   You develop lightheadedness or feel faint.   You develop shortness of breath or chest pain.  MAKE SURE YOU:  Understand these instructions.   Will watch your condition.   Will get help right away if you are not doing well or get worse.  Document Released: 09/26/2010 Document Revised: 04/02/2011 Document Reviewed: 09/26/2010 [facility] Patient Information 2012 [place], [place].

## 2011-07-20 NOTE — Discharge Summary (Signed)
Physician Discharge Summary  Patient ID: Samantha Quinn MRN: [id number] DOB/AGE: 1927/11/09 78 y.o.  Admit date: 07/17/2011 Discharge date: 07/20/2011  Admission Diagnoses: 1.History of RUL mass (adenocarcinoma) 2.History of DM 3.History of hypertension 4.History of hyperlipidemia 5.History of iatrogenic pneumothorax (s/p lung biopsy) 6.History of hypothyroidism  Discharge Diagnoses:  1.History of RUL mass (adenocarcinoma) 2.History of DM 3.History of hypertension 4.History of hyperlipidemia 5.History of iatrogenic pneumothorax (s/p lung biopsy) 6.History of hypothyroidism   Procedure (s):  Bronchoscopy, right video-assisted thoracoscopy,  right upper lobectomy with node dissection, and placement of On-Q  Device by Dr. Tyrone Sage on 07/20/2011.  History of Presenting Illness: This is an 78 year old female lifelong nonsmoker who had an chest x-ray done in [place] several weeks ago . She had orthopedic surgery the day prior, took an Ultram the morning after surgery and had a syncopal episode. Because of a syncopal episode, she was admitted to [facility] and observed. Chest x-ray revealed an opacity in the right upper lobe. The patient notes that she had some upper respiratory tract infection symptoms in early January (cough, several days of fever), but this had resolved the week prior to her orthopedic surgery.       PET scan was done.Results showed the right upper lobe lung mass to have an SUV max of 4 (likely neoplasm), no mediastinal or hilar adenopathy, and no evidence of metastatic disease.The patient then underwent a  needle biopsy of the right upper lobe lung lesion on 07/09/2011. She had a complication of a pneumothorax,requiring a chest tube for several days. She was discharged home from the hospital in stable condition on 07/17/2011. She was then seen in thoracic consultation by Dr. Tyrone Sage in the office on 3/22 for evaluation and management of the right upper lobe lung lesion  (biopsy-proven adenocarcinoma). Potential risks, benefits, and complications of the surgery were discussed with the patient and she agreed to proceed. She was admitted to [facility] on 07/20/2011 in order to undergo a bronchoscopy, right VATS, and  right upper lobectomy with lymph node dissection.   Brief Hospital Course:  She remained afebrile and hemodynamic is stable. Her chest tube did not have an air leak postoperatively. Her chest tubes were placed to water seal then #1. Her A-line and Foley were removed early in her postop course. Daily chest x-rays were obtained. She's felt surgically stable for transfer from the intensive care unit to 3300 further convalescence on 07/18/2011. She was found to have acute blood loss anemia. Her H&H went down to 8.8 26.7. She was not started on Nu-Iron secondary to nausea. X-rays remained stable. Her chest tube was pulled on 07/20/2011. Follow up CXR revealed no pneumothorax and stable surgical changes. Her On Q and PCA were removed on this date as well. diet and is passing flatus. Provided her chest x-ray remains stable, she remains afebrile, and pending morning round evaluation, she'll be surgically stable for discharge on 07/21/2011.  Filed Vitals:   07/20/11 0800  BP: 153/69  Pulse:   Temp: 98.1 F (36.7 C)  Resp:      Latest Vital Signs: Blood pressure 153/69, pulse 86, temperature 98.1 F (36.7 C), temperature source Oral, resp. rate 19, height 5\' 1"  (1.549 m), weight 138 lb 0.1 oz (62.6 kg), SpO2 96.00%.  Physical Exam: Cardiovascular: RRR, no murmurs, gallops, or rubs.  Pulmonary: Diminished at bases; no rales, wheezes, or rhonchi.  Abdomen: Soft, non tender, bowel sounds present.  Extremities: No lower extremity edema.  Wounds: Dressing clean and dry.  Discharge Condition:Stable  Recent laboratory studies:  Lab Results  Component Value Date   WBC 9.3 07/20/2011   HGB 8.4* 07/20/2011   HCT 25.5* 07/20/2011   MCV 86.1 07/20/2011   PLT  221 07/20/2011   Lab Results  Component Value Date   NA 138 07/19/2011   K 3.9 07/19/2011   CL 105 07/19/2011   CO2 24 07/19/2011   CREATININE 0.45* 07/19/2011   GLUCOSE 127* 07/19/2011      Diagnostic Studies: Dg Chest 1 View   07/20/2011  *RADIOLOGY REPORT*  Clinical Data: Chest tube removal.  PORTABLE CHEST - 1 VIEW  Comparison: 07/20/2011.  Findings: The right-sided chest tube has been removed.  No definite pneumothorax.  Stable surgical changes.  The right IJ catheter is stable.  No effusions or infiltrates.  IMPRESSION:  1.  Removal of right-sided chest tube without definite right-sided pneumothorax. 2.  Stable position of the right IJ catheter. 3.  No significant pulmonary findings.  Original Report Authenticated By: P. Loralie Champagne, M.D.   Dg Chest Port 1 View  07/20/2011  *RADIOLOGY REPORT*  Clinical Data: Post right upper lobectomy  PORTABLE CHEST - 1 VIEW  Comparison: 07/19/2011; 07/18/2011; 07/17/2011  Findings:  Grossly unchanged enlarged cardiac silhouette and mediastinal contours.  Interval removal of one of the right-sided chest tubes.  The residual apically directed right-sided chest tube is unchanged in positioning.  No definite pneumothorax.  Unchanged positioning of right jugular approach central venous catheter with tip over the superior cavoatrial junction.  Mild pulmonary venous congestion without frank evidence of pulmonary edema.  Persistent mild elevation of the right hemidiaphragm. Grossly unchanged bibasilar opacities, right greater than left.  Likely unchanged trace bilateral pleural effusions.  Grossly unchanged bones including right-sided rotator cuff repair.  IMPRESSION: 1.  Interval removal of one the right-sided chest tubes.  No pneumothorax. 2.  Grossly unchanged trace bilateral effusions and bibasilar opacities favored to represent atelectasis.  Original Report Authenticated By: Waynard Reeds, M.D.     Discharge Orders    Future Appointments: Provider:  Department: Dept Phone: Center:   08/03/2011 2:00 PM [facility] [phone] [facility]   08/11/2011 2:45 PM [name], MD [facility] [phone] None      Discharge Medications: Medication List  As of 07/22/2011  9:09 AM   TAKE these medications         acetaminophen 325 MG tablet   Commonly known as: TYLENOL   Take 2 tablets (650 mg total) by mouth every 4 (four) hours as needed for pain.      amitriptyline 10 MG tablet   Commonly known as: ELAVIL   Take 5 mg by mouth at bedtime.      bisoprolol-hydrochlorothiazide 2.5-6.25 MG per tablet   Commonly known as: ZIAC   Take 1 tablet by mouth daily.      ibuprofen 200 MG tablet   Commonly known as: ADVIL,MOTRIN   Take 200 mg by mouth every 8 (eight) hours as needed. For pain      levothyroxine 100 MCG tablet   Commonly known as: SYNTHROID, LEVOTHROID   Take 100 mcg by mouth every other day.      lovastatin 20 MG tablet   Commonly known as: MEVACOR   Take 20 mg by mouth every other day.      metFORMIN 500 MG tablet   Commonly known as: GLUCOPHAGE   Take 500 mg by mouth 2 (two) times daily with a meal.  oxyCODONE 5 MG immediate release tablet   Commonly known as: Oxy IR/ROXICODONE   Take 1-2 tablets (5-10 mg total) by mouth every 3 (three) hours as needed for pain.      ramipril 2.5 MG capsule   Commonly known as: ALTACE   Take 2.5 mg by mouth daily.           Follow Up Appointments: Follow-up Information    Follow up with GERHARDT,EDWARD B, MD. (PA/LAT CXR to be taken on 08/03/2011 at 1:00 pm;Appointment with Dr. Dennie Maizes physician assistant is on 08/03/2011 at 2:00 pm)    Contact information:   [address] [place] [address] [phone]          Signed: Doree Fudge MPA-C 07/20/2011, 12:48 PM  ADDENDUM: The patient stayed in the hospital one additional day to improve mobillity and for monitoring of her chest x-ray.  She has remained stable,  and is currently ready for discharge home on 07/22/2011.  There were no other changes from the above discharge summary.  Keltin Baird PA-C 07/22/2011  9:11 AM

## 2011-07-20 NOTE — Op Note (Signed)
Samantha Quinn, Samantha Quinn                 ACCOUNT NO.:  [id number]  MEDICAL RECORD NO.:  [id number]  LOCATION:  3301                         FACILITY:  [facility]  PHYSICIAN:  Sheliah Plane, MD    DATE OF BIRTH:  June 13, 1927  DATE OF PROCEDURE:  07/17/2011 DATE OF DISCHARGE:                              OPERATIVE REPORT   PREOPERATIVE DIAGNOSIS:  Non-small-cell lung cancer, right upper lobe.  POSTOPERATIVE DIAGNOSIS:  Non-small-cell lung cancer, right upper lobe.  SURGICAL PROCEDURES:  Bronchoscopy, right video-assisted thoracoscopy, right upper lobectomy with node dissection, and placement of On-Q device.  SURGEON:  Dr. Tyrone Sage  FIRST ASSISTANT:  Pauline Good, PA  BRIEF HISTORY:  The patient is an 78 year old female who had recently had a foot surgery and was taking Toradol after discharge from her orthopedic procedure.  She became nauseated after taking pain medication, had a near syncopal episode, and ultimately was admitted to [facility] for this.  She recovered quickly, but incidentally was found to have a right upper lobe inflammatory appearing mass.  She was referred by Dr. Patty Sermons for further evaluation of this.  CT scan was performed, and subsequent PET scan was performed, which showed the area to be PET avid but without any mediastinal involvement.  The total area had areas of air bronchograms in it and was unclear if it was inflammatory or malignant.  A needle biopsy was performed.  The patient did require a chest tube after this for a pneumothorax but this ultimately resolved.  Pathology revealed non-small-cell lung cancer. The patient is a lifelong nonsmoker.  Her pulmonary function and overall functional status was very good and right upper lobectomy was recommended, and the patient did discuss the treatment options with Radiation Oncology in the [facility].  With her good functional status, the preferred treatment was resection.  The patient agreed and  signed informed consent.  DESCRIPTION OF THE PROCEDURE:  With central line and arterial line in place, the patient underwent general endotracheal anesthesia without incident.  A fiberoptic video bronchoscope was passed through the endotracheal tube to the subsegmental level bilaterally and showed no endobronchial lesions.  The scope was removed.  The patient was turned in lateral decubitus position with the right side up, small 1 port site was created initially and through this, a 30-degree video thoracoscope was passed.  There was no evidence of pleural spread of malignancy. Additional port sites anterior and posterior were created and a small right thoracotomy incision through the port sites and incision, and we proceeded with right upper lobectomy.  The fissure between the lower lobe and the middle lobe was fairly well developed.  We proceeded anteriorly dividing the right superior pulmonary vein and continued dissecting the hilum identifying the pulmonary artery branches to the right upper lobe.  Each of these was encircled and stapled with a vascular stapler.  A medium thick autosuture staplers were used to complete the fissure.  The right upper lobe bronchus was then isolated. Through a small incision, a TA stapler was placed and used to staple the right upper lobe bronchus and the bronchus was divided.  Specimen was removed and submitted to pathology, both marking and checking the  bronchial margins and also the marginal along the major fissure as this was the closest pulmonary margin and the margins were all clear.  The bronchial stump was inspected and tested for air leak.  The On-Q device was tunneled through a separate small incision posteriorly and along the subpleural space.  The catheter was placed and secured.  Through the port sites, two 28 chest tubes were placed.  Prior to moving to closure, additional lymph node tissue from the hilum was submitted and  labeled appropriately for each nodal station.  With operative field hemostatic and the right middle and lower lobes well inflated, the two 28 chest tubes were left in place and secured.  The small incision was closed with interrupted 0 Vicryl and a deep muscle layer, running 2-0 Vicryl in subcutaneous tissue, and a 3-0 subcuticular in the skin edges.  Dry dressings were applied.  Sponge and needle counts were reported as correct at the completion of the procedure.  The patient tolerated the procedure without obvious complication and was transferred to the recovery room, extubated for further postop care.  ESTIMATED BLOOD LOSS:  200 mL.     Sheliah Plane, MD     EG/MEDQ  D:  07/20/2011  T:  07/20/2011  Job:  [id number]

## 2011-07-21 ENCOUNTER — Inpatient Hospital Stay (HOSPITAL_COMMUNITY): Payer: Medicare Other

## 2011-07-21 DIAGNOSIS — J9383 Other pneumothorax: Secondary | ICD-10-CM | POA: Diagnosis not present

## 2011-07-21 DIAGNOSIS — J90 Pleural effusion, not elsewhere classified: Secondary | ICD-10-CM | POA: Diagnosis not present

## 2011-07-21 DIAGNOSIS — J9819 Other pulmonary collapse: Secondary | ICD-10-CM | POA: Diagnosis not present

## 2011-07-21 LAB — GLUCOSE, CAPILLARY
Glucose-Capillary: 130 mg/dL — ABNORMAL HIGH (ref 70–99)
Glucose-Capillary: 108 mg/dL — ABNORMAL HIGH (ref 70–99)
Glucose-Capillary: 118 mg/dL — ABNORMAL HIGH (ref 70–99)
Glucose-Capillary: 125 mg/dL — ABNORMAL HIGH (ref 70–99)

## 2011-07-21 MED ORDER — OXYCODONE HCL 5 MG PO TABS
5.0000 mg | ORAL_TABLET | ORAL | Status: DC | PRN
  Administered 2011-07-21 – 2011-07-22 (×5): 5 mg via ORAL
  Filled 2011-07-21 (×6): qty 1

## 2011-07-21 NOTE — Progress Notes (Signed)
UR Completed.  Samantha Quinn Jane [phone] 07/21/2011  

## 2011-07-21 NOTE — Progress Notes (Signed)
Pt ambulated 147ft with RW x 1 assist, steady gait. Will continue to monitor.

## 2011-07-21 NOTE — Progress Notes (Signed)
                    [address]            [place] [address]          [phone]     4 Days Post-Op Procedure(s) (LRB): VIDEO ASSISTED THORACOSCOPY (VATS)/WEDGE RESECTION (Right) VIDEO BRONCHOSCOPY (N/A)  Subjective: Still weak, only walked once yesterday.  +BM  Objective: Vital signs in last 24 hours: Patient Vitals for the past 24 hrs:  BP Temp Temp src Pulse Resp SpO2 Weight  07/21/11 0817 - - - - - 98 % -  07/21/11 0600 150/82 mmHg 98.4 F (36.9 C) Oral 97  18  98 % 56.8 kg (125 lb 3.5 oz)  07/20/11 2156 - - - - - 96 % -  07/20/11 1900 152/83 mmHg 98.4 F (36.9 C) Oral 100  16  95 % -  07/20/11 1600 139/59 mmHg 98 F (36.7 C) - - - - -  07/20/11 1200 146/61 mmHg 98 F (36.7 C) - - - - -   Current Weight  07/21/11 56.8 kg (125 lb 3.5 oz)     Intake/Output from previous day: 03/25 0701 - 03/26 0700 In: -  Out: 20 [Chest Tube:20]  CBGs 159-113-130  PHYSICAL EXAM:  Heart: RRR Lungs: clear Wound: clean and dry   Lab Results: CBC: Basename 07/20/11 0352 07/19/11 0526  WBC 9.3 9.9  HGB 8.4* 8.8*  HCT 25.5* 26.7*  PLT 221 241   BMET:  Basename 07/19/11 0526  NA 138  K 3.9  CL 105  CO2 24  GLUCOSE 127*  BUN 6  CREATININE 0.45*  CALCIUM 8.4    PT/INR: No results found for this basename: LABPROT,INR in the last 72 hours  CXR: Postoperative changes on the right with small right pneumothorax  and right effusion  Assessment/Plan: S/P Procedure(s) (LRB): VIDEO ASSISTED THORACOSCOPY (VATS)/WEDGE RESECTION (Right) VIDEO BRONCHOSCOPY (N/A) Doing well overall, but still weak.  Needs to ambulate today. Hopefully home in am if remains stable.   LOS: 4 days    Samantha Quinn H 07/21/2011

## 2011-07-22 ENCOUNTER — Inpatient Hospital Stay (HOSPITAL_COMMUNITY): Payer: Medicare Other

## 2011-07-22 LAB — GLUCOSE, CAPILLARY: Glucose-Capillary: 116 mg/dL — ABNORMAL HIGH (ref 70–99)

## 2011-07-22 MED ORDER — ACETAMINOPHEN 325 MG PO TABS: 650.0000 mg | ORAL_TABLET | ORAL | Status: DC | PRN

## 2011-07-22 MED ORDER — OXYCODONE HCL 5 MG PO TABS: 5.0000 mg | ORAL_TABLET | ORAL | Status: AC | PRN

## 2011-07-22 NOTE — Progress Notes (Signed)
Pt discharge instructions and patient education complete. IJ d/c. Site WNL. No s/s of distress. D/C home via wheelchair Chestine Spore, Bary Richard

## 2011-07-22 NOTE — Progress Notes (Signed)
R IJ discontinued per MD order. Pt tolerated procedure well. Pressure held for 5 minutes. Pressure dressing applied. Dion Saucier

## 2011-07-22 NOTE — Progress Notes (Signed)
                    [address]            [place] [address]          [phone]     5 Days Post-Op Procedure(s) (LRB): VIDEO ASSISTED THORACOSCOPY (VATS)/WEDGE RESECTION (Right) VIDEO BRONCHOSCOPY (N/A)  Subjective: Pain better controlled with Oxy.  Feels well, ready to go home.  Objective: Vital signs in last 24 hours: Patient Vitals for the past 24 hrs:  BP Temp Temp src Pulse Resp SpO2 Weight  07/22/11 0800 - - - - - 96 % -  07/22/11 0537 100/57 mmHg 98.4 F (36.9 C) Oral 95  18  95 % 57.153 kg (126 lb)  07/21/11 2132 131/75 mmHg 97.3 F (36.3 C) Oral 106  18  98 % -  07/21/11 2046 - - - - - 97 % -  07/21/11 1450 - - - - - 97 % -  07/21/11 1400 133/72 mmHg 97.9 F (36.6 C) Oral 100  18  98 % -  07/21/11 1045 147/80 mmHg - - 98  - - -   Current Weight  07/22/11 57.153 kg (126 lb)     Intake/Output from previous day: 03/26 0701 - 03/27 0700 In: 240 [P.O.:240] Out: -     PHYSICAL EXAM:  Heart: RRR Lungs: clear Wound:clean and dry   Lab Results: CBC: Basename 07/20/11 0352  WBC 9.3  HGB 8.4*  HCT 25.5*  PLT 221   BMET: No results found for this basename: NA:2,K:2,CL:2,CO2:2,GLUCOSE:2,BUN:2,CREATININE:2,CALCIUM:2 in the last 72 hours  PT/INR: No results found for this basename: LABPROT,INR in the last 72 hours CXR stable small R apical ptx  Assessment/Plan: S/P Procedure(s) (LRB): VIDEO ASSISTED THORACOSCOPY (VATS)/WEDGE RESECTION (Right) VIDEO BRONCHOSCOPY (N/A) Home today- instructions reviewed with pt.   LOS: 5 days    Jeanene Mena H 07/22/2011

## 2011-07-28 ENCOUNTER — Other Ambulatory Visit: Payer: Self-pay | Admitting: Cardiothoracic Surgery

## 2011-07-28 DIAGNOSIS — D381 Neoplasm of uncertain behavior of trachea, bronchus and lung: Secondary | ICD-10-CM

## 2011-08-03 ENCOUNTER — Ambulatory Visit (INDEPENDENT_AMBULATORY_CARE_PROVIDER_SITE_OTHER): Payer: Self-pay | Admitting: Surgical

## 2011-08-03 ENCOUNTER — Ambulatory Visit
Admission: RE | Admit: 2011-08-03 | Discharge: 2011-08-03 | Disposition: A | Discharge status: Home / Self Care | Payer: Medicare Other | Source: Ambulatory Visit | Attending: Cardiothoracic Surgery | Admitting: Cardiothoracic Surgery

## 2011-08-03 VITALS — BP 127/72 | HR 80 | Temp 98.6°F | Resp 16 | Ht 60.0 in | Wt 127.0 lb

## 2011-08-03 DIAGNOSIS — Z9889 Other specified postprocedural states: Secondary | ICD-10-CM

## 2011-08-03 DIAGNOSIS — D381 Neoplasm of uncertain behavior of trachea, bronchus and lung: Secondary | ICD-10-CM

## 2011-08-03 DIAGNOSIS — C3490 Malignant neoplasm of unspecified part of unspecified bronchus or lung: Secondary | ICD-10-CM | POA: Diagnosis not present

## 2011-08-03 NOTE — Patient Instructions (Signed)
We discussed following up with oncology. Discussed wound care. Discussed pain management. Discussed driving.

## 2011-08-03 NOTE — Progress Notes (Signed)
PCP is Delight Ovens, MD, MD Referring Provider is Delight Ovens, MD  Chief Complaint  Patient presents with  . Routine Post Op    2wk fu GDC S/P BRONCH/RT UPPER LOBECTOMY    HPI: Patient is seen on today's date in routine followup from her right upper lobectomy. She has adenocarcinoma. She has occasional shortness of breath with exertion but otherwise is doing quite well in her recovery. She denies fevers chills or other constitutional symptoms. She does have some pain in the anterior lateral portion of the pectoralis region.   Past Medical History  Diagnosis Date  . Diabetes mellitus   . Hyperlipidemia     takes Lovastatin every other day  . Hypercholesterolemia   . Dyslipidemia   . Myalgia   . Primary adenocarcinoma of lung 06/24/2011  . PONV (postoperative nausea and vomiting)   . Osteoarthritis   . Pneumothorax of right lung after biopsy 07/09/11    HAD CHEST TUBE PLACEMENT  . Hypertension     takes Ziac daily and Ramipril as well  . Hypothyroidism     takes Synthroid every other day  . Embolism - blood clot     hx of in left lower leg and was on Coumadin for about 33months;this was about 8-51yrs ago  . Lung mass   . Bronchitis     hx of-many,many years   . Upper respiratory infection 04/2011  . Hx of migraines     as a teenager  . Back pain     bulding disc  . Osteoporosis   . Joint pain   . Skin cancer     legs have dark spots on them  . History of colonic polyps   . Urinary incontinence     takes Detrol daily prn  . Diabetes mellitus     type 2 and takes Metformin bid  . Insomnia     takes Elavil nightly    Past Surgical History  Procedure Date  . Cholecystectomy   . Hemorrhoid surgery   . Colonoscopy   . Lung biopsy 07/09/11    RIGHT UPER LOBE LUNG MASS   . Right chest tube placement 07/10/11    S/P LUNG BX  . Chest tube removal 07/13/11    RIGHT  CHEST TUBE  . Tonsillectomy     as a child  . Appendectomy     as a child  . Dilation and  curettage of uterus     x 2  . Abdominal hysterectomy   . Rotator cuff repair     bilateral  . Toe surgery 2013    right small toe  . Colonoscopy   . Video bronchoscopy 07/17/2011    Procedure: VIDEO BRONCHOSCOPY;  Surgeon: Delight Ovens, MD;  Location: [facility] OR;  Service: Thoracic;  Laterality: N/A;    Family History  Problem Relation Age of Onset  . Arthritis Sister   . Arthritis Sister   . Diabetes Brother   . Hypertension Mother   . Anesthesia problems Neg Hx   . Hypotension Neg Hx   . Malignant hyperthermia Neg Hx   . Pseudochol deficiency Neg Hx     Social History History  Substance Use Topics  . Smoking status: Never Smoker   . Smokeless tobacco: Not on file  . Alcohol Use: No    Current Outpatient Prescriptions  Medication Sig Dispense Refill  . acetaminophen (TYLENOL) 325 MG tablet Take 2 tablets (650 mg total) by mouth every 4 (four) hours  as needed for pain.      [name] amitriptyline (ELAVIL) 10 MG tablet Take 5 mg by mouth at bedtime.      . bisoprolol-hydrochlorothiazide (ZIAC) 2.5-6.25 MG per tablet Take 1 tablet by mouth daily.      [name] levothyroxine (SYNTHROID, LEVOTHROID) 100 MCG tablet Take 100 mcg by mouth every other day.       . lovastatin (MEVACOR) 20 MG tablet Take 20 mg by mouth every other day.       . metFORMIN (GLUCOPHAGE) 500 MG tablet Take 500 mg by mouth 2 (two) times daily with a meal.      . ramipril (ALTACE) 2.5 MG capsule Take 2.5 mg by mouth daily.      [name] ibuprofen (ADVIL,MOTRIN) 200 MG tablet Take 200 mg by mouth every 8 (eight) hours as needed. For pain      . DISCONTD: tolterodine (DETROL) 2 MG tablet 2 mg by Per post-pyloric tube route Daily. Per gyne        Allergies  Allergen Reactions  . Codeine   . Demerol     nausea  . Tramadol     syncope    Review of Systems otherwise noncontributory  BP 127/72  Pulse 80  Temp 98.6 F (37 C)  Resp 16  Ht 5' (1.524 m)  Wt 127 lb (57.607 kg)  BMI 24.80 kg/m2  SpO2 96% Physical  ExamPhysical Examination: General appearance - alert, well appearing, and in no distress Chest - clear to auscultation, no wheezes, rales or rhonchi, symmetric air entry Heart - normal rate, regular rhythm, normal S1, S2, no murmurs, rubs, clicks or gallops Skin - incisions healing well without evidence of infection.   Diagnostic Tests: A chest x-ray was obtained on today's date. It reveals improving aeration and small right pleural effusion. There appears to be a small apical air space.   Impression: She is doing quite well. She has not seen medical oncology and will arrange for an appointment for her to see Dr. Gwenyth Bouillon at the cancer center. We will have a appointment arranged in followup with Dr. Tyrone Sage after his return from [facility] rotation. She also asked about taking Advil for pain which I told her she could do. She did also start driving small distances and a light hours.

## 2011-08-10 LAB — CULTURE, FUNGUS WITHOUT SMEAR: Special Requests: NORMAL

## 2011-08-11 ENCOUNTER — Ambulatory Visit: Payer: Self-pay | Admitting: Cardiology

## 2011-08-21 DIAGNOSIS — E119 Type 2 diabetes mellitus without complications: Secondary | ICD-10-CM | POA: Diagnosis not present

## 2011-08-21 DIAGNOSIS — I10 Essential (primary) hypertension: Secondary | ICD-10-CM | POA: Diagnosis not present

## 2011-08-21 DIAGNOSIS — Z79899 Other long term (current) drug therapy: Secondary | ICD-10-CM | POA: Diagnosis not present

## 2011-08-21 DIAGNOSIS — E039 Hypothyroidism, unspecified: Secondary | ICD-10-CM | POA: Diagnosis not present

## 2011-08-21 DIAGNOSIS — Z809 Family history of malignant neoplasm, unspecified: Secondary | ICD-10-CM | POA: Diagnosis not present

## 2011-08-21 DIAGNOSIS — C3410 Malignant neoplasm of upper lobe, unspecified bronchus or lung: Secondary | ICD-10-CM | POA: Diagnosis not present

## 2011-08-21 DIAGNOSIS — E785 Hyperlipidemia, unspecified: Secondary | ICD-10-CM | POA: Diagnosis not present

## 2011-08-23 LAB — AFB CULTURE WITH SMEAR (NOT AT ARMC): Acid Fast Smear: NONE SEEN

## 2011-08-24 DIAGNOSIS — L570 Actinic keratosis: Secondary | ICD-10-CM | POA: Diagnosis not present

## 2011-08-24 DIAGNOSIS — L578 Other skin changes due to chronic exposure to nonionizing radiation: Secondary | ICD-10-CM | POA: Diagnosis not present

## 2011-08-24 DIAGNOSIS — Z85828 Personal history of other malignant neoplasm of skin: Secondary | ICD-10-CM | POA: Diagnosis not present

## 2011-08-25 ENCOUNTER — Other Ambulatory Visit: Payer: Self-pay | Admitting: Cardiothoracic Surgery

## 2011-08-25 DIAGNOSIS — D381 Neoplasm of uncertain behavior of trachea, bronchus and lung: Secondary | ICD-10-CM

## 2011-08-26 DIAGNOSIS — C3410 Malignant neoplasm of upper lobe, unspecified bronchus or lung: Secondary | ICD-10-CM | POA: Diagnosis not present

## 2011-08-27 ENCOUNTER — Ambulatory Visit
Admission: RE | Admit: 2011-08-27 | Discharge: 2011-08-27 | Disposition: A | Discharge status: Home / Self Care | Payer: Medicare Other | Source: Ambulatory Visit | Attending: Cardiothoracic Surgery | Admitting: Cardiothoracic Surgery

## 2011-08-27 ENCOUNTER — Encounter: Payer: Self-pay | Admitting: Cardiothoracic Surgery

## 2011-08-27 ENCOUNTER — Ambulatory Visit (INDEPENDENT_AMBULATORY_CARE_PROVIDER_SITE_OTHER): Payer: Self-pay | Admitting: Cardiothoracic Surgery

## 2011-08-27 VITALS — BP 134/72 | HR 81 | Resp 20 | Ht 60.0 in | Wt 129.0 lb

## 2011-08-27 DIAGNOSIS — D381 Neoplasm of uncertain behavior of trachea, bronchus and lung: Secondary | ICD-10-CM

## 2011-08-27 DIAGNOSIS — J984 Other disorders of lung: Secondary | ICD-10-CM | POA: Diagnosis not present

## 2011-08-27 DIAGNOSIS — Z9889 Other specified postprocedural states: Secondary | ICD-10-CM | POA: Diagnosis not present

## 2011-08-27 DIAGNOSIS — Z902 Acquired absence of lung [part of]: Secondary | ICD-10-CM

## 2011-08-27 DIAGNOSIS — C3490 Malignant neoplasm of unspecified part of unspecified bronchus or lung: Secondary | ICD-10-CM

## 2011-08-27 DIAGNOSIS — R05 Cough: Secondary | ICD-10-CM | POA: Diagnosis not present

## 2011-08-27 NOTE — Progress Notes (Signed)
[address]            [place] [address]          [phone]       Samantha Quinn [facility] Medical Record #[id number] Date of Birth: 12-10-1927  [name], MD  Chief Complaint:   PostOp Follow Up Visit   History of Present Illness:     Patient is an 78 year old female who underwent surgical resection of a 3.5 cm invasive well differentiated adenocarcinoma in the right upper lobe on 07/17/2011,S he's made excellent progress postoperatively nearly returning to Near-normal activities. She's had a minor cough no fever and chills.          History  Smoking status  . Never Smoker   Smokeless tobacco  . Not on file       Allergies  Allergen Reactions  . Codeine   . Demerol     nausea  . Tramadol     syncope    Current Outpatient Prescriptions  Medication Sig Dispense Refill  . acetaminophen (TYLENOL) 325 MG tablet Take 2 tablets (650 mg total) by mouth every 4 (four) hours as needed for pain.      [name] amitriptyline (ELAVIL) 10 MG tablet Take 5 mg by mouth at bedtime.      . bisoprolol-hydrochlorothiazide (ZIAC) 2.5-6.25 MG per tablet Take 1 tablet by mouth daily.      [name] levothyroxine (SYNTHROID, LEVOTHROID) 100 MCG tablet Take 100 mcg by mouth every other day.       . lovastatin (MEVACOR) 20 MG tablet Take 20 mg by mouth every other day.       . metFORMIN (GLUCOPHAGE) 500 MG tablet Take 500 mg by mouth 2 (two) times daily with a meal.      . ramipril (ALTACE) 2.5 MG capsule Take 2.5 mg by mouth daily.      [name] DISCONTD: tolterodine (DETROL) 2 MG tablet 2 mg by Per post-pyloric tube route Daily. Per gyne           Physical Exam: BP 134/72  Pulse 81  Resp 20  Ht 5' (1.524 m)  Wt 129 lb (58.514 kg)  BMI 25.19 kg/m2  SpO2 93%  General appearance: alert and cooperative Neurologic: intact Heart: regular rate and rhythm, S1, S2 normal, no murmur, click, rub or gallop and normal apical impulse Lungs: clear to  auscultation bilaterally and normal percussion bilaterally Abdomen: soft, non-tender; bowel sounds normal; no masses,  no organomegaly Extremities: extremities normal, atraumatic, no cyanosis or edema Wound: Her right chest wound and chest tube sites are well-healed  Diagnostic Studies & Laboratory data:         Recent Radiology Findings: Dg Chest 2 View  08/27/2011  *RADIOLOGY REPORT*  Clinical Data: Status post lung surgery.  Cough.  CHEST - 2 VIEW  Comparison: 08/03/2011  Findings: Heart size appears normal.  There are postoperative changes and volume loss involving the right hemithorax.  Right lung base scarring and tenting of the right hemidiaphragm appears similar to previous exam.  Resolution of previously noted right apical pneumothorax.  No new findings identified.  IMPRESSION:  1.  Resolution of right apical pneumothorax.  Original Report Authenticated By: Rosealee Albee, M.D.      Recent Labs: Lab Results  Component Value Date   WBC 9.3 07/20/2011   HGB 8.4* 07/20/2011   HCT 25.5* 07/20/2011  PLT 221 07/20/2011   GLUCOSE 127* 07/19/2011   CHOL 162 02/27/2011   TRIG 112.0 02/27/2011   HDL 53.20 02/27/2011   LDLCALC 86 02/27/2011   ALT 10 07/19/2011   AST 11 07/19/2011   NA 138 07/19/2011   K 3.9 07/19/2011   CL 105 07/19/2011   CREATININE 0.45* 07/19/2011   BUN 6 07/19/2011   CO2 24 07/19/2011   TSH 0.01* 02/27/2011   INR 0.96 07/16/2011      Assessment / Plan:      p T2a, pN0 M0 well-differentiated adenocarcinoma of the right upper lobe resected Patient doing well postoperatively EGRF and ALK are still pending we will check with pathology to ensure that they were sent We'll plan to see the patient back in 3 months with a followup chest x-ray, and probable CT scan in 6 months after surgery.      Samantha Ovens MD 08/27/2011 5:23 PM

## 2011-09-06 ENCOUNTER — Other Ambulatory Visit: Payer: Self-pay | Admitting: Cardiology

## 2011-09-07 NOTE — Telephone Encounter (Signed)
Refilled lovastatin 

## 2011-09-09 ENCOUNTER — Ambulatory Visit (INDEPENDENT_AMBULATORY_CARE_PROVIDER_SITE_OTHER): Payer: Medicare Other | Admitting: Cardiology

## 2011-09-09 ENCOUNTER — Encounter: Payer: Self-pay | Admitting: Cardiology

## 2011-09-09 VITALS — BP 118/70 | HR 66 | Ht 61.0 in | Wt 128.0 lb

## 2011-09-09 DIAGNOSIS — Z85118 Personal history of other malignant neoplasm of bronchus and lung: Secondary | ICD-10-CM

## 2011-09-09 DIAGNOSIS — I119 Hypertensive heart disease without heart failure: Secondary | ICD-10-CM | POA: Diagnosis not present

## 2011-09-09 DIAGNOSIS — E039 Hypothyroidism, unspecified: Secondary | ICD-10-CM

## 2011-09-09 DIAGNOSIS — E7800 Pure hypercholesterolemia, unspecified: Secondary | ICD-10-CM | POA: Diagnosis not present

## 2011-09-09 DIAGNOSIS — E119 Type 2 diabetes mellitus without complications: Secondary | ICD-10-CM

## 2011-09-09 DIAGNOSIS — D649 Anemia, unspecified: Secondary | ICD-10-CM | POA: Diagnosis not present

## 2011-09-09 LAB — TSH: TSH: 0.02 u[IU]/mL — ABNORMAL LOW (ref 0.35–5.50)

## 2011-09-09 LAB — CBC WITH DIFFERENTIAL/PLATELET
Basophils Relative: 0.5 % (ref 0.0–3.0)
Eosinophils Relative: 1.6 % (ref 0.0–5.0)
HCT: 34.8 % — ABNORMAL LOW (ref 36.0–46.0)
Lymphs Abs: 1.4 10*3/uL (ref 0.7–4.0)
MCV: 82.9 fl (ref 78.0–100.0)
Monocytes Relative: 7.9 % (ref 3.0–12.0)
Neutrophils Relative %: 67.9 % (ref 43.0–77.0)
Platelets: 301 10*3/uL (ref 150.0–400.0)
RBC: 4.19 Mil/uL (ref 3.87–5.11)
WBC: 6.3 10*3/uL (ref 4.5–10.5)

## 2011-09-09 LAB — LIPID PANEL
Cholesterol: 154 mg/dL (ref 0–200)
HDL: 51.3 mg/dL (ref >39.00)
LDL Cholesterol: 78 mg/dL (ref 0–99)
Total CHOL/HDL Ratio: 3
Triglycerides: 123 mg/dL (ref 0.0–149.0)
VLDL: 24.6 mg/dL (ref 0.0–40.0)

## 2011-09-09 LAB — BASIC METABOLIC PANEL
CO2: 27 mEq/L (ref 19–32)
Chloride: 102 mEq/L (ref 96–112)
Creatinine, Ser: 0.6 mg/dL (ref 0.4–1.2)
Sodium: 139 mEq/L (ref 135–145)

## 2011-09-09 LAB — T4, FREE: Free T4: 1.08 ng/dL (ref 0.60–1.60)

## 2011-09-09 LAB — HEPATIC FUNCTION PANEL
Albumin: 3.9 g/dL (ref 3.5–5.2)
Alkaline Phosphatase: 60 U/L (ref 39–117)
Total Protein: 6.9 g/dL (ref 6.0–8.3)

## 2011-09-09 NOTE — Assessment & Plan Note (Signed)
A. she has a history of diabetes mellitus.  She is not having any hypoglycemic episodes.  She is on metformin

## 2011-09-09 NOTE — Assessment & Plan Note (Signed)
The patient has not been having any chest pain or shortness of breath or palpitations

## 2011-09-09 NOTE — Assessment & Plan Note (Signed)
Postoperatively in the hospital in March 2013 her hemoglobin was down to 8.6.  She has not felt particularly tired.  We are rechecking a CBC today.

## 2011-09-09 NOTE — Patient Instructions (Signed)
Will obtain labs today and call you with the results  Your physician recommends that you continue on your current medications as directed. Please refer to the Current Medication list given to you today.  Your physician recommends that you schedule a follow-up appointment in: 4 months with fasting labs (lp/bmet/hfp)  

## 2011-09-09 NOTE — Assessment & Plan Note (Signed)
The patient remains on low-dose Mevacor for her hypercholesterolemia.  She is not having any myalgias.  We are checking lab work fasting today.

## 2011-09-09 NOTE — Progress Notes (Signed)
Samantha Quinn Date of Birth:  Feb 16, 1928 [facility] [address] [place], [place]  [address] [phone]         Fax   [phone]  History of Present Illness: This pleasant 78 year old woman is seen for a followup office visit.  The patient was recently hospitalized for a right lung mass and underwent surgery by Dr. Tyrone Sage for a adenocarcinoma of the right lung.  She did very well postoperatively. The patient has a past history of hypercholesterolemia and essential hypertension.  She also has mild diabetes mellitus.  She has hypothyroidism and a history of a thyroid nodule.  She is on Synthroid.  She does not have any history of ischemic heart disease.  Current Outpatient Prescriptions  Medication Sig Dispense Refill  . acetaminophen (TYLENOL) 325 MG tablet Take 2 tablets (650 mg total) by mouth every 4 (four) hours as needed for pain.      [name] amitriptyline (ELAVIL) 10 MG tablet Take 5 mg by mouth at bedtime.      . bisoprolol-hydrochlorothiazide (ZIAC) 2.5-6.25 MG per tablet Take 1 tablet by mouth daily.      [name] levothyroxine (SYNTHROID, LEVOTHROID) 100 MCG tablet Take 100 mcg by mouth every other day.       . lovastatin (MEVACOR) 20 MG tablet Taking one every other day  45 tablet  3  . metFORMIN (GLUCOPHAGE) 500 MG tablet Take 500 mg by mouth 2 (two) times daily with a meal.      . ramipril (ALTACE) 2.5 MG capsule Take 2.5 mg by mouth daily.      [name] tolterodine (DETROL) 2 MG tablet Take 2 mg by mouth daily.       . fluorouracil (EFUDEX) 5 % cream as directed.        Allergies  Allergen Reactions  . Codeine   . Demerol     nausea  . Tramadol     syncope    Patient Active Problem List  Diagnoses  . Benign hypertensive heart disease without heart failure  . Hypercholesterolemia  . Diabetes mellitus  . Hypothyroidism (acquired)  . Osteoarthritis  . Abnormal chest x-ray  . Primary adenocarcinoma of lung  . Pneumothorax after biopsy    History    Smoking status  . Never Smoker   Smokeless tobacco  . Not on file    History  Alcohol Use No    Family History  Problem Relation Age of Onset  . Arthritis Sister   . Arthritis Sister   . Diabetes Brother   . Hypertension Mother   . Anesthesia problems Neg Hx   . Hypotension Neg Hx   . Malignant hyperthermia Neg Hx   . Pseudochol deficiency Neg Hx     Review of Systems: Constitutional: no fever chills diaphoresis or fatigue or change in weight.  Head and neck: no hearing loss, no epistaxis, no photophobia or visual disturbance. Respiratory: No cough, shortness of breath or wheezing. Cardiovascular: No chest pain peripheral edema, palpitations. Gastrointestinal: No abdominal distention, no abdominal pain, no change in bowel habits hematochezia or melena. Genitourinary: No dysuria, no frequency, no urgency, no nocturia. Musculoskeletal:No arthralgias, no back pain, no gait disturbance or myalgias. Neurological: No dizziness, no headaches, no numbness, no seizures, no syncope, no weakness, no tremors. Hematologic: No lymphadenopathy, no easy bruising. Psychiatric: No confusion, no hallucinations, no sleep disturbance.    Physical Exam: Filed Vitals:   09/09/11 1109  BP: 118/70  Pulse: 66   the general appearance reveals  a well-developed well-nourished woman in no distress.  The right thoracotomy incision appears to be healing well.Pupils equal and reactive.   Extraocular Movements are full.  There is no scleral icterus.  The mouth and pharynx are normal.  The neck is supple.  The carotids reveal no bruits.  The jugular venous pressure is normal.  The thyroid is enlarged with a goiter.  There is no lymphadenopathy. The chest is clear to percussion and auscultation. There are no rales or rhonchi. Expansion of the chest is symmetrical.  The precordium is quiet.  The first heart sound is normal.  The second heart sound is physiologically split.  There is no murmur gallop rub or  click.  There is no abnormal lift or heave.  The abdomen is soft and nontender. Bowel sounds are normal. The liver and spleen are not enlarged. There Are no abdominal masses. There are no bruits.  The pedal pulses are good.  There is no phlebitis or edema.  There is no cyanosis or clubbing. Strength is normal and symmetrical in all extremities.  There is no lateralizing weakness.  There are no sensory deficits.       Assessment / Plan: Continue same medication.  Blood work pending today.  Recheck in 4 months for office visit and fasting lab work.  She is having some right back and hip pain for which she will be seeing Dr. Channing Mutters

## 2011-09-09 NOTE — Assessment & Plan Note (Signed)
The patient has a history of hypothyroidism.  She has a thyroid goiter.  She is clinically euthyroid.  She remains on Synthroid and we are checking a TSH and a free T4 level today.  We talked about at some point referring him to endocrinology for further evaluation of her goiter but she would like to postpone that for now

## 2011-09-10 NOTE — Progress Notes (Signed)
Quick Note:  Please report to patient. The recent labs are stable. Continue same medication and careful diet. Still anemic but improving. Add OTC iron for 1-2 months ______

## 2011-09-15 ENCOUNTER — Telehealth: Payer: Self-pay | Admitting: *Deleted

## 2011-09-15 NOTE — Progress Notes (Signed)
Encounter addended by: Aubrei Bouchie Bruner Brailyn Killion, RN on: 09/15/2011 10:28 AM<BR>     Documentation filed: Charges VN

## 2011-09-15 NOTE — Telephone Encounter (Signed)
Message copied by Burnell Blanks on Tue Sep 15, 2011  2:33 PM ------      Message from: [name]      Created: Thu Sep 10, 2011  8:49 PM       Please report to patient.  The recent labs are stable. Continue same medication and careful diet. Still anemic but improving.  Add OTC iron for 1-2 months

## 2011-09-15 NOTE — Telephone Encounter (Signed)
Advised and will get OTC iron for 1-2 months

## 2011-09-15 NOTE — Telephone Encounter (Signed)
Left message to call back  

## 2011-09-15 NOTE — Telephone Encounter (Signed)
Fu call °Pt returning your call  °

## 2011-09-15 NOTE — Telephone Encounter (Signed)
Message copied by Burnell Blanks on Tue Sep 15, 2011  5:29 PM ------      Message from: [name]      Created: Thu Sep 10, 2011  8:49 PM       Please report to patient.  The recent labs are stable. Continue same medication and careful diet. Still anemic but improving.  Add OTC iron for 1-2 months

## 2011-09-28 DIAGNOSIS — M259 Joint disorder, unspecified: Secondary | ICD-10-CM | POA: Diagnosis not present

## 2011-09-30 DIAGNOSIS — M169 Osteoarthritis of hip, unspecified: Secondary | ICD-10-CM | POA: Diagnosis not present

## 2011-09-30 DIAGNOSIS — Z85118 Personal history of other malignant neoplasm of bronchus and lung: Secondary | ICD-10-CM | POA: Diagnosis not present

## 2011-09-30 DIAGNOSIS — M25459 Effusion, unspecified hip: Secondary | ICD-10-CM | POA: Diagnosis not present

## 2011-09-30 DIAGNOSIS — M25559 Pain in unspecified hip: Secondary | ICD-10-CM | POA: Diagnosis not present

## 2011-10-01 DIAGNOSIS — H2510 Age-related nuclear cataract, unspecified eye: Secondary | ICD-10-CM | POA: Diagnosis not present

## 2011-10-01 DIAGNOSIS — H35379 Puckering of macula, unspecified eye: Secondary | ICD-10-CM | POA: Diagnosis not present

## 2011-10-01 DIAGNOSIS — E119 Type 2 diabetes mellitus without complications: Secondary | ICD-10-CM | POA: Diagnosis not present

## 2011-10-01 DIAGNOSIS — H353190 Nonexudative age-related macular degeneration, unspecified eye, stage unspecified: Secondary | ICD-10-CM | POA: Diagnosis not present

## 2011-10-06 DIAGNOSIS — M48061 Spinal stenosis, lumbar region without neurogenic claudication: Secondary | ICD-10-CM | POA: Diagnosis not present

## 2011-10-06 DIAGNOSIS — M5137 Other intervertebral disc degeneration, lumbosacral region: Secondary | ICD-10-CM | POA: Diagnosis not present

## 2011-10-06 DIAGNOSIS — M47817 Spondylosis without myelopathy or radiculopathy, lumbosacral region: Secondary | ICD-10-CM | POA: Diagnosis not present

## 2011-10-06 DIAGNOSIS — M76899 Other specified enthesopathies of unspecified lower limb, excluding foot: Secondary | ICD-10-CM | POA: Diagnosis not present

## 2011-10-09 DIAGNOSIS — I10 Essential (primary) hypertension: Secondary | ICD-10-CM | POA: Diagnosis not present

## 2011-10-09 DIAGNOSIS — E785 Hyperlipidemia, unspecified: Secondary | ICD-10-CM | POA: Diagnosis not present

## 2011-10-09 DIAGNOSIS — M1990 Unspecified osteoarthritis, unspecified site: Secondary | ICD-10-CM | POA: Diagnosis not present

## 2011-10-09 DIAGNOSIS — M76899 Other specified enthesopathies of unspecified lower limb, excluding foot: Secondary | ICD-10-CM | POA: Diagnosis not present

## 2011-10-09 DIAGNOSIS — E0590 Thyrotoxicosis, unspecified without thyrotoxic crisis or storm: Secondary | ICD-10-CM | POA: Diagnosis not present

## 2011-10-09 DIAGNOSIS — E119 Type 2 diabetes mellitus without complications: Secondary | ICD-10-CM | POA: Diagnosis not present

## 2011-10-09 DIAGNOSIS — Z79899 Other long term (current) drug therapy: Secondary | ICD-10-CM | POA: Diagnosis not present

## 2011-10-09 DIAGNOSIS — M5137 Other intervertebral disc degeneration, lumbosacral region: Secondary | ICD-10-CM | POA: Diagnosis not present

## 2011-10-09 DIAGNOSIS — Z85118 Personal history of other malignant neoplasm of bronchus and lung: Secondary | ICD-10-CM | POA: Diagnosis not present

## 2011-10-09 DIAGNOSIS — Z886 Allergy status to analgesic agent status: Secondary | ICD-10-CM | POA: Diagnosis not present

## 2011-10-09 DIAGNOSIS — H9190 Unspecified hearing loss, unspecified ear: Secondary | ICD-10-CM | POA: Diagnosis not present

## 2011-10-09 DIAGNOSIS — IMO0001 Reserved for inherently not codable concepts without codable children: Secondary | ICD-10-CM | POA: Diagnosis not present

## 2011-11-04 DIAGNOSIS — M5126 Other intervertebral disc displacement, lumbar region: Secondary | ICD-10-CM | POA: Diagnosis not present

## 2011-11-04 DIAGNOSIS — M5137 Other intervertebral disc degeneration, lumbosacral region: Secondary | ICD-10-CM | POA: Diagnosis not present

## 2011-11-04 DIAGNOSIS — M259 Joint disorder, unspecified: Secondary | ICD-10-CM | POA: Diagnosis not present

## 2011-11-04 DIAGNOSIS — M76899 Other specified enthesopathies of unspecified lower limb, excluding foot: Secondary | ICD-10-CM | POA: Diagnosis not present

## 2011-11-17 ENCOUNTER — Encounter: Payer: Medicare Other | Admitting: Hematology and Oncology

## 2011-11-17 DIAGNOSIS — M5126 Other intervertebral disc displacement, lumbar region: Secondary | ICD-10-CM | POA: Diagnosis not present

## 2011-11-17 DIAGNOSIS — E785 Hyperlipidemia, unspecified: Secondary | ICD-10-CM | POA: Diagnosis not present

## 2011-11-17 DIAGNOSIS — C3410 Malignant neoplasm of upper lobe, unspecified bronchus or lung: Secondary | ICD-10-CM | POA: Diagnosis not present

## 2011-11-17 DIAGNOSIS — E119 Type 2 diabetes mellitus without complications: Secondary | ICD-10-CM

## 2011-11-17 DIAGNOSIS — M259 Joint disorder, unspecified: Secondary | ICD-10-CM | POA: Diagnosis not present

## 2011-11-23 ENCOUNTER — Other Ambulatory Visit: Payer: Self-pay | Admitting: Dermatology

## 2011-11-23 DIAGNOSIS — D485 Neoplasm of uncertain behavior of skin: Secondary | ICD-10-CM | POA: Diagnosis not present

## 2011-11-23 DIAGNOSIS — L578 Other skin changes due to chronic exposure to nonionizing radiation: Secondary | ICD-10-CM | POA: Diagnosis not present

## 2011-11-23 DIAGNOSIS — L570 Actinic keratosis: Secondary | ICD-10-CM | POA: Diagnosis not present

## 2011-11-24 ENCOUNTER — Encounter: Payer: Medicare Other | Admitting: Hematology and Oncology

## 2011-11-24 DIAGNOSIS — Z23 Encounter for immunization: Secondary | ICD-10-CM

## 2011-11-24 DIAGNOSIS — C3490 Malignant neoplasm of unspecified part of unspecified bronchus or lung: Secondary | ICD-10-CM

## 2011-11-26 ENCOUNTER — Other Ambulatory Visit: Payer: Self-pay | Admitting: Cardiothoracic Surgery

## 2011-11-26 DIAGNOSIS — C3490 Malignant neoplasm of unspecified part of unspecified bronchus or lung: Secondary | ICD-10-CM

## 2011-11-26 DIAGNOSIS — L940 Localized scleroderma [morphea]: Secondary | ICD-10-CM | POA: Diagnosis not present

## 2011-12-03 ENCOUNTER — Ambulatory Visit (INDEPENDENT_AMBULATORY_CARE_PROVIDER_SITE_OTHER): Payer: Medicare Other | Admitting: Cardiothoracic Surgery

## 2011-12-03 ENCOUNTER — Encounter: Payer: Self-pay | Admitting: Cardiothoracic Surgery

## 2011-12-03 ENCOUNTER — Ambulatory Visit
Admission: RE | Admit: 2011-12-03 | Discharge: 2011-12-03 | Disposition: A | Discharge status: Home / Self Care | Payer: Medicare Other | Source: Ambulatory Visit | Attending: Cardiothoracic Surgery | Admitting: Cardiothoracic Surgery

## 2011-12-03 VITALS — BP 152/74 | HR 67 | Resp 16 | Ht 61.0 in | Wt 126.0 lb

## 2011-12-03 DIAGNOSIS — R079 Chest pain, unspecified: Secondary | ICD-10-CM | POA: Diagnosis not present

## 2011-12-03 DIAGNOSIS — Z85118 Personal history of other malignant neoplasm of bronchus and lung: Secondary | ICD-10-CM | POA: Diagnosis not present

## 2011-12-03 DIAGNOSIS — C3490 Malignant neoplasm of unspecified part of unspecified bronchus or lung: Secondary | ICD-10-CM | POA: Diagnosis not present

## 2011-12-03 DIAGNOSIS — Z09 Encounter for follow-up examination after completed treatment for conditions other than malignant neoplasm: Secondary | ICD-10-CM

## 2011-12-03 NOTE — Progress Notes (Addendum)
[address]            [place] [address]          [phone]                      KIMIYE STRATHMAN [facility] Medical Record #[id number] Date of Birth: July 29, 1927  [name], MD  Chief Complaint:   PostOp Follow Up Visit 07/17/2011  DATE OF DISCHARGE:  OPERATIVE REPORT  PREOPERATIVE DIAGNOSIS: Non-small-cell lung cancer, right upper lobe.  POSTOPERATIVE DIAGNOSIS: Non-small-cell lung cancer, right upper lobe.  SURGICAL PROCEDURES: Bronchoscopy, right video-assisted thoracoscopy,  right upper lobectomy with node dissection, and placement of On-Q  device.  p T2a, pN0 M0 well-differentiated adenocarcinoma of the right upper lobe resected 07/17/2011 ALK & EGFR negative   History of Present Illness:     Patient is an 78 year old female who underwent surgical resection of a 3.5 cm invasive well differentiated adenocarcinoma in the right upper lobe on 07/17/2011,S he's made excellent progress postoperatively nearly returning to Near-normal activities. Patient named over the freezer and has some right costal margin pain but this is improving. She notes she had her pneumococcal vaccination.       History  Smoking status  . Never Smoker   Smokeless tobacco  . Not on file       Allergies  Allergen Reactions  . Codeine   . Demerol     nausea  . Tramadol     syncope    Current Outpatient Prescriptions  Medication Sig Dispense Refill  . amitriptyline (ELAVIL) 10 MG tablet Take 5 mg by mouth at bedtime.      . bisoprolol-hydrochlorothiazide (ZIAC) 2.5-6.25 MG per tablet Take 1 tablet by mouth daily.      . fluorouracil (EFUDEX) 5 % cream as directed.      [name] ibuprofen (ADVIL,MOTRIN) 200 MG tablet Take 200 mg by mouth every 6 (six) hours as needed.      [name] levothyroxine (SYNTHROID, LEVOTHROID) 100 MCG tablet Take 100 mcg by mouth every other day.       . lovastatin (MEVACOR) 20 MG tablet Taking one every other day  45 tablet  3  .  metFORMIN (GLUCOPHAGE) 500 MG tablet Take 500 mg by mouth 2 (two) times daily with a meal.      . ramipril (ALTACE) 2.5 MG capsule Take 2.5 mg by mouth daily.           Physical Exam: BP 152/74  Pulse 67  Resp 16  Ht 5\' 1"  (1.549 m)  Wt 126 lb (57.153 kg)  BMI 23.81 kg/m2  SpO2 98%  General appearance: alert and cooperative Neurologic: intact Heart: regular rate and rhythm, S1, S2 normal, no murmur, click, rub or gallop and normal apical impulse Lungs: clear to auscultation bilaterally and normal percussion bilaterally Abdomen: soft, non-tender; bowel sounds normal; no masses,  no organomegaly Extremities: extremities normal, atraumatic, no cyanosis or edema Wound: Her right chest wound and chest tube sites are well-healed  Diagnostic Studies & Laboratory data:         Recent Radiology Findings: Dg Chest 2 View  12/03/2011  *RADIOLOGY REPORT*  Clinical Data: History of surgery on the right lung for lung carcinoma in March, some right anterior rib pain currently  CHEST - 2 VIEW  Comparison: Chest x-ray of 08/27/2011  Findings: Postoperative changes at the  right lung base are stable. No infiltrate or effusion is seen.  Mild cardiomegaly is stable. There is a thoracolumbar scoliosis present.  IMPRESSION: Stable postoperative change at the right lung base.  No active lung disease.  Original Report Authenticated By: Juline Patch, M.D.      Recent Labs: Lab Results  Component Value Date   WBC 6.3 09/09/2011   HGB 11.2* 09/09/2011   HCT 34.8* 09/09/2011   PLT 301.0 09/09/2011   GLUCOSE 111* 09/09/2011   CHOL 154 09/09/2011   TRIG 123.0 09/09/2011   HDL 51.30 09/09/2011   LDLCALC 78 09/09/2011   ALT 14 09/09/2011   AST 17 09/09/2011   NA 139 09/09/2011   K 3.9 09/09/2011   CL 102 09/09/2011   CREATININE 0.6 09/09/2011   BUN 16 09/09/2011   CO2 27 09/09/2011   TSH 0.02* 09/09/2011   INR 0.96 07/16/2011      Assessment / Plan:      p T2a, pN0 M0 well-differentiated adenocarcinoma of  the right upper lobe resected 07/17/2011 Patient doing well postoperatively EGFR  and ALK  Is negative , We'll plan to see the patient back in OCT  2013 with follow up ct of chest.     Delight Ovens MD 12/03/2011 9:33 AM

## 2011-12-03 NOTE — Patient Instructions (Signed)
Chest xray  Ok  Will get ct at next visit

## 2011-12-04 ENCOUNTER — Other Ambulatory Visit: Payer: Self-pay

## 2011-12-04 ENCOUNTER — Other Ambulatory Visit: Payer: Self-pay | Admitting: Cardiology

## 2011-12-04 DIAGNOSIS — G4700 Insomnia, unspecified: Secondary | ICD-10-CM

## 2011-12-04 NOTE — Telephone Encounter (Signed)
Refilled amitriptyline 

## 2011-12-14 ENCOUNTER — Other Ambulatory Visit: Payer: Self-pay | Admitting: Cardiology

## 2011-12-30 ENCOUNTER — Other Ambulatory Visit (INDEPENDENT_AMBULATORY_CARE_PROVIDER_SITE_OTHER): Payer: Medicare Other

## 2011-12-30 ENCOUNTER — Encounter: Payer: Self-pay | Admitting: Cardiology

## 2011-12-30 ENCOUNTER — Ambulatory Visit (INDEPENDENT_AMBULATORY_CARE_PROVIDER_SITE_OTHER): Payer: Medicare Other | Admitting: Cardiology

## 2011-12-30 VITALS — BP 128/70 | HR 60 | Ht 61.0 in | Wt 125.0 lb

## 2011-12-30 DIAGNOSIS — D509 Iron deficiency anemia, unspecified: Secondary | ICD-10-CM | POA: Diagnosis not present

## 2011-12-30 DIAGNOSIS — E039 Hypothyroidism, unspecified: Secondary | ICD-10-CM

## 2011-12-30 DIAGNOSIS — I119 Hypertensive heart disease without heart failure: Secondary | ICD-10-CM | POA: Diagnosis not present

## 2011-12-30 DIAGNOSIS — M1990 Unspecified osteoarthritis, unspecified site: Secondary | ICD-10-CM

## 2011-12-30 DIAGNOSIS — D649 Anemia, unspecified: Secondary | ICD-10-CM

## 2011-12-30 DIAGNOSIS — M7150 Other bursitis, not elsewhere classified, unspecified site: Secondary | ICD-10-CM | POA: Diagnosis not present

## 2011-12-30 DIAGNOSIS — M719 Bursopathy, unspecified: Secondary | ICD-10-CM

## 2011-12-30 DIAGNOSIS — E7800 Pure hypercholesterolemia, unspecified: Secondary | ICD-10-CM

## 2011-12-30 DIAGNOSIS — R0989 Other specified symptoms and signs involving the circulatory and respiratory systems: Secondary | ICD-10-CM

## 2011-12-30 LAB — LIPID PANEL
Cholesterol: 145 mg/dL (ref 0–200)
LDL Cholesterol: 73 mg/dL (ref 0–99)

## 2011-12-30 LAB — CBC WITH DIFFERENTIAL/PLATELET
Basophils Absolute: 0 10*3/uL (ref 0.0–0.1)
Basophils Absolute: 0 10*3/uL (ref 0.0–0.1)
Eosinophils Absolute: 0.1 10*3/uL (ref 0.0–0.7)
Eosinophils Relative: 1 % (ref 0.0–5.0)
HCT: 38 % (ref 36.0–46.0)
HCT: 37.5 % (ref 36.0–46.0)
Lymphs Abs: 1 10*3/uL (ref 0.7–4.0)
Lymphs Abs: 1.1 10*3/uL (ref 0.7–4.0)
MCHC: 32.4 g/dL (ref 30.0–36.0)
MCV: 82.4 fl (ref 78.0–100.0)
MCV: 81.5 fl (ref 78.0–100.0)
Monocytes Absolute: 0.3 10*3/uL (ref 0.1–1.0)
Monocytes Absolute: 0.3 10*3/uL (ref 0.1–1.0)
Monocytes Relative: 6.4 % (ref 3.0–12.0)
Platelets: 275 10*3/uL (ref 150.0–400.0)
Platelets: 273 10*3/uL (ref 150.0–400.0)
RDW: 16.4 % — ABNORMAL HIGH (ref 11.5–14.6)
RDW: 16.2 % — ABNORMAL HIGH (ref 11.5–14.6)

## 2011-12-30 LAB — HEPATIC FUNCTION PANEL
AST: 16 U/L (ref 0–37)
Alkaline Phosphatase: 61 U/L (ref 39–117)
Bilirubin, Direct: 0.1 mg/dL (ref 0.0–0.3)
Total Protein: 7 g/dL (ref 6.0–8.3)

## 2011-12-30 LAB — BASIC METABOLIC PANEL
BUN: 17 mg/dL (ref 6–23)
Chloride: 103 mEq/L (ref 96–112)
Creatinine, Ser: 0.6 mg/dL (ref 0.4–1.2)

## 2011-12-30 LAB — HEMOGLOBIN A1C: Hgb A1c MFr Bld: 6.5 % (ref 4.6–6.5)

## 2011-12-30 NOTE — Progress Notes (Signed)
Quick Note:  Please report to patient. The recent labs are stable. Continue same medication and careful diet. Thyroid okay. A1C 6.5 good. ______

## 2011-12-30 NOTE — Progress Notes (Signed)
Samantha Quinn Date of Birth:  1927/06/20 [facility] [address] [place], [place]  [address] [phone]         Fax   [phone]  History of Present Illness: This pleasant 78 year old woman is seen for a followup office visit. The patient was recently hospitalized for a right lung mass and underwent surgery by Dr. Tyrone Sage for a adenocarcinoma of the right lung. She did very well postoperatively.  The patient has a past history of hypercholesterolemia and essential hypertension. She also has mild diabetes mellitus. She has hypothyroidism and a history of a thyroid nodule. She is on Synthroid. She does not have any history of ischemic heart disease.   Current Outpatient Prescriptions  Medication Sig Dispense Refill  . amitriptyline (ELAVIL) 10 MG tablet TAKE ONE TABLET BY MOUTH AT BEDTIME  90 tablet  2  . bisoprolol-hydrochlorothiazide (ZIAC) 2.5-6.25 MG per tablet Take 1 tablet by mouth daily.      . clobetasol ointment (TEMOVATE) 0.05 % as directed.      . fluorouracil (EFUDEX) 5 % cream as directed.      [name] ibuprofen (ADVIL,MOTRIN) 200 MG tablet Take 200 mg by mouth every 6 (six) hours as needed.      [name] levothyroxine (SYNTHROID, LEVOTHROID) 100 MCG tablet       . lovastatin (MEVACOR) 20 MG tablet Taking one every other day  45 tablet  3  . metFORMIN (GLUCOPHAGE) 500 MG tablet Take 500 mg by mouth 2 (two) times daily with a meal.      . ramipril (ALTACE) 2.5 MG capsule Take 2.5 mg by mouth daily.      [name] DISCONTD: levothyroxine (SYNTHROID, LEVOTHROID) 100 MCG tablet TAKE ONE TABLET BY MOUTH EVERY DAY  90 tablet  3    Allergies  Allergen Reactions  . Codeine   . Demerol     nausea  . Tramadol     syncope    Patient Active Problem List  Diagnosis  . Benign hypertensive heart disease without heart failure  . Hypercholesterolemia  . Diabetes mellitus  . Hypothyroidism (acquired)  . Osteoarthritis  . Abnormal chest x-ray  . Primary adenocarcinoma of  lung  . Pneumothorax after biopsy  . Anemia    History  Smoking status  . Never Smoker   Smokeless tobacco  . Not on file    History  Alcohol Use No    Family History  Problem Relation Age of Onset  . Arthritis Sister   . Arthritis Sister   . Diabetes Brother   . Hypertension Mother   . Anesthesia problems Neg Hx   . Hypotension Neg Hx   . Malignant hyperthermia Neg Hx   . Pseudochol deficiency Neg Hx     Review of Systems: Constitutional: no fever chills diaphoresis or fatigue or change in weight.  Head and neck: no hearing loss, no epistaxis, no photophobia or visual disturbance. Respiratory: No cough, shortness of breath or wheezing. Cardiovascular: No chest pain peripheral edema, palpitations. Gastrointestinal: No abdominal distention, no abdominal pain, no change in bowel habits hematochezia or melena. Genitourinary: No dysuria, no frequency, no urgency, no nocturia. Musculoskeletal:No arthralgias, no back pain, no gait disturbance or myalgias. Neurological: No dizziness, no headaches, no numbness, no seizures, no syncope, no weakness, no tremors. Hematologic: No lymphadenopathy, no easy bruising. Psychiatric: No confusion, no hallucinations, no sleep disturbance.    Physical Exam: Filed Vitals:   12/30/11 1013  BP: 128/70  Pulse: 60   the general  appearance reveals a well-developed well-nourished woman in no distress.The head and neck exam reveals pupils equal and reactive.  Extraocular movements are full.  There is no scleral icterus.  The mouth and pharynx are normal.  The neck is supple.  The carotids reveal no bruits.  The jugular venous pressure is normal.  The  thyroid is not enlarged.  There is no lymphadenopathy.  The chest is clear to percussion and auscultation.  There are no rales or rhonchi.  Expansion of the chest is symmetrical.  The precordium is quiet.  The first heart sound is normal.  The second heart sound is physiologically split.  There is no  murmur gallop rub or click.  There is no abnormal lift or heave.  The abdomen is soft and nontender.  The bowel sounds are normal.  The liver and spleen are not enlarged.  There are no abdominal masses.  There are no abdominal bruits.  Extremities reveal good pedal pulses.  There is no phlebitis or edema.  There is no cyanosis or clubbing.  Strength is normal and symmetrical in all extremities.  There is no lateralizing weakness.  There are no sensory deficits.  The skin is warm and dry.  There is no rash.     Assessment / Plan: Continue same medication.  Rate results of today's lab work.  She may need to go back on. For her back and her hip pain she takes an Advil at bedtime with improvement. Rechecked for followup office visit and full lab work in 4 months

## 2011-12-30 NOTE — Assessment & Plan Note (Signed)
Patient denies any hypoglycemic episodes. 

## 2011-12-30 NOTE — Assessment & Plan Note (Signed)
The patient is on lovastatin for her hypercholesterolemia.  Blood work today is pending.  She is not having any adverse effects from the statin therapy

## 2011-12-30 NOTE — Assessment & Plan Note (Signed)
The patient has a history of iron deficiency anemia.  At the present time she is not taking on urine.  We are checking a CBC today to see if she needs to go back on iron or not

## 2011-12-30 NOTE — Patient Instructions (Addendum)
Will obtain labs today and call you with the results   Your physician recommends that you continue on your current medications as directed. Please refer to the Current Medication list given to you today.  Your physician wants you to follow-up in: 4 months with fasting labs (lp/bmet/hfp/tsh/a1c/cbc)  You will receive a reminder letter in the mail two months in advance. If you don't receive a letter, please call our office to schedule the follow-up appointment.

## 2011-12-30 NOTE — Assessment & Plan Note (Signed)
The patient has been having a lot of back pain and pain in the right hip.  She has seen a physiatrist in [place] and has received an injection for bursitis of the hip which has helped.  The pain also radiates to the right knee.  Her hip and knee pain have prevented her from doing as much walking as she would like to do.

## 2011-12-30 NOTE — Assessment & Plan Note (Signed)
Blood pressure has been remaining stable on current therapy. 

## 2012-01-01 ENCOUNTER — Telehealth: Payer: Self-pay | Admitting: *Deleted

## 2012-01-01 NOTE — Telephone Encounter (Signed)
Mailed copy of labs and left message to call if any questions  

## 2012-01-01 NOTE — Telephone Encounter (Signed)
Message copied by Burnell Blanks on Fri Jan 01, 2012  5:45 PM ------      Message from: [name]      Created: Wed Dec 30, 2011  7:09 PM       Please report to patient.  The recent labs are stable. Continue same medication and careful diet. Thyroid okay. A1C 6.5 good.

## 2012-01-07 ENCOUNTER — Telehealth: Payer: Self-pay | Admitting: Cardiology

## 2012-01-07 NOTE — Telephone Encounter (Signed)
1) Macular degenerative disease supp. (vit c, vit e, zinc, copper, lutein, bilberry, zeaxanthin)Ok to take?    2)daughter would like cardiac evaluation but no cardiac issues.  GYN recommended, in her 33  Will forward to  Dr. Patty Sermons for review

## 2012-01-07 NOTE — Telephone Encounter (Signed)
New Problem:    Patient called in with a couple questions about her lab work.  Please call back.

## 2012-01-13 ENCOUNTER — Other Ambulatory Visit: Payer: Self-pay | Admitting: Cardiothoracic Surgery

## 2012-01-13 DIAGNOSIS — C3490 Malignant neoplasm of unspecified part of unspecified bronchus or lung: Secondary | ICD-10-CM

## 2012-01-15 DIAGNOSIS — Z23 Encounter for immunization: Secondary | ICD-10-CM | POA: Diagnosis not present

## 2012-01-17 NOTE — Telephone Encounter (Signed)
1.  Ok to take the macular degeneration vitamins.  2.  Ok to see the daughter for cardiac eval sometime in view of risk factors and positive FH

## 2012-01-19 NOTE — Telephone Encounter (Signed)
Advised patient and scheduled appointment for daughter

## 2012-01-21 DIAGNOSIS — M76899 Other specified enthesopathies of unspecified lower limb, excluding foot: Secondary | ICD-10-CM | POA: Diagnosis not present

## 2012-01-21 DIAGNOSIS — M5137 Other intervertebral disc degeneration, lumbosacral region: Secondary | ICD-10-CM | POA: Diagnosis not present

## 2012-01-21 DIAGNOSIS — E0590 Thyrotoxicosis, unspecified without thyrotoxic crisis or storm: Secondary | ICD-10-CM | POA: Diagnosis not present

## 2012-01-21 DIAGNOSIS — E119 Type 2 diabetes mellitus without complications: Secondary | ICD-10-CM | POA: Diagnosis not present

## 2012-01-21 DIAGNOSIS — E785 Hyperlipidemia, unspecified: Secondary | ICD-10-CM | POA: Diagnosis not present

## 2012-01-21 DIAGNOSIS — M1990 Unspecified osteoarthritis, unspecified site: Secondary | ICD-10-CM | POA: Diagnosis not present

## 2012-01-21 DIAGNOSIS — Z886 Allergy status to analgesic agent status: Secondary | ICD-10-CM | POA: Diagnosis not present

## 2012-01-21 DIAGNOSIS — H9190 Unspecified hearing loss, unspecified ear: Secondary | ICD-10-CM | POA: Diagnosis not present

## 2012-01-21 DIAGNOSIS — I10 Essential (primary) hypertension: Secondary | ICD-10-CM | POA: Diagnosis not present

## 2012-01-21 DIAGNOSIS — Z885 Allergy status to narcotic agent status: Secondary | ICD-10-CM | POA: Diagnosis not present

## 2012-01-21 DIAGNOSIS — Z79899 Other long term (current) drug therapy: Secondary | ICD-10-CM | POA: Diagnosis not present

## 2012-01-21 DIAGNOSIS — C3490 Malignant neoplasm of unspecified part of unspecified bronchus or lung: Secondary | ICD-10-CM | POA: Diagnosis not present

## 2012-02-04 ENCOUNTER — Ambulatory Visit (INDEPENDENT_AMBULATORY_CARE_PROVIDER_SITE_OTHER): Payer: Medicare Other | Admitting: Cardiothoracic Surgery

## 2012-02-04 ENCOUNTER — Ambulatory Visit
Admission: RE | Admit: 2012-02-04 | Discharge: 2012-02-04 | Disposition: A | Discharge status: Home / Self Care | Payer: Medicare Other | Source: Ambulatory Visit | Attending: Cardiothoracic Surgery | Admitting: Cardiothoracic Surgery

## 2012-02-04 ENCOUNTER — Encounter: Payer: Self-pay | Admitting: Cardiothoracic Surgery

## 2012-02-04 VITALS — BP 138/76 | HR 66 | Resp 16 | Ht 61.0 in | Wt 125.0 lb

## 2012-02-04 DIAGNOSIS — Z09 Encounter for follow-up examination after completed treatment for conditions other than malignant neoplasm: Secondary | ICD-10-CM | POA: Diagnosis not present

## 2012-02-04 DIAGNOSIS — J984 Other disorders of lung: Secondary | ICD-10-CM | POA: Diagnosis not present

## 2012-02-04 DIAGNOSIS — C3410 Malignant neoplasm of upper lobe, unspecified bronchus or lung: Secondary | ICD-10-CM

## 2012-02-04 DIAGNOSIS — C3490 Malignant neoplasm of unspecified part of unspecified bronchus or lung: Secondary | ICD-10-CM

## 2012-02-04 NOTE — Patient Instructions (Signed)
Ct scan of chest looks good Return 6 months for repeat ct of chest for follow up of Stage I lung cancer

## 2012-02-04 NOTE — Progress Notes (Signed)
[address]            [place] [address]          [phone]                        Samantha Quinn [facility] Medical Record #[id number] Date of Birth: Aug 18, 1927  [name], MD  Chief Complaint:   PostOp Follow Up Visit 07/17/2011  DATE OF DISCHARGE:  OPERATIVE REPORT  PREOPERATIVE DIAGNOSIS: Non-small-cell lung cancer, right upper lobe.  POSTOPERATIVE DIAGNOSIS: Non-small-cell lung cancer, right upper lobe.  SURGICAL PROCEDURES: Bronchoscopy, right video-assisted thoracoscopy,  right upper lobectomy with node dissection, and placement of On-Q  device.  p T2a, pN0 M0 well-differentiated adenocarcinoma of the right upper lobe resected 07/17/2011 ALK & EGFR negative   History of Present Illness:     Patient is an 78 year old female who underwent surgical resection of a 3.5 cm invasive well differentiated adenocarcinoma in the right upper lobe on 07/17/2011,S he's made excellent progress postoperatively nearly returning to Near-normal activities. Doing well without respiratory limitations. Returns today for follow up ct of chest. She notes she had her pneumococcal vaccination.     History  Smoking status  . Never Smoker   Smokeless tobacco  . Not on file       Allergies  Allergen Reactions  . Codeine   . Demerol     nausea  . Tramadol     syncope    Current Outpatient Prescriptions  Medication Sig Dispense Refill  . amitriptyline (ELAVIL) 10 MG tablet TAKE ONE TABLET BY MOUTH AT BEDTIME  90 tablet  2  . bisoprolol-hydrochlorothiazide (ZIAC) 2.5-6.25 MG per tablet Take 1 tablet by mouth daily.      . clobetasol ointment (TEMOVATE) 0.05 % as directed.      . fluorouracil (EFUDEX) 5 % cream as directed.      [name] ibuprofen (ADVIL,MOTRIN) 200 MG tablet Take 200 mg by mouth every 6 (six) hours as needed.      [name] levothyroxine (SYNTHROID, LEVOTHROID) 100 MCG tablet       . lovastatin (MEVACOR) 20 MG tablet  Taking one every other day  45 tablet  3  . metFORMIN (GLUCOPHAGE) 500 MG tablet Take 500 mg by mouth 2 (two) times daily with a meal.      . ramipril (ALTACE) 2.5 MG capsule Take 2.5 mg by mouth daily.           Physical Exam: BP 138/76  Pulse 66  Resp 16  Ht 5\' 1"  (1.549 m)  Wt 125 lb (56.7 kg)  BMI 23.62 kg/m2  SpO2 96%  General appearance: alert and cooperative Neurologic: intact Heart: regular rate and rhythm, S1, S2 normal, no murmur, click, rub or gallop and normal apical impulse Lungs: clear to auscultation bilaterally and normal percussion bilaterally Abdomen: soft, non-tender; bowel sounds normal; no masses,  no organomegaly Extremities: extremities normal, atraumatic, no cyanosis or edema Wound: Her right chest wound and chest tube sites are well-healed  Diagnostic Studies & Laboratory data:         Recent Radiology Findings: Ct Chest Wo Contrast  02/04/2012  *RADIOLOGY REPORT*  Clinical Data: Lung cancer.  Resection March 2013  CT CHEST WITHOUT CONTRAST  Technique:  Multidetector  CT imaging of the chest was performed following the standard protocol without IV contrast.  Comparison: CT chest 06/13/2011  Findings: Interval right upper lobectomy for mass removal.  No recurrent mass or adenopathy is identified.  There is mild scarring in the right lung base.  No lung nodule is present.  No adenopathy in the mediastinum.  Right thyroid nodule is unchanged.  Partially calcified cyst in the spleen measures 5.8 cm in diameter and is unchanged.  This may be due to prior trauma.  Normal adrenal glands.  IMPRESSION: Postop resection of right upper lobe mass.  No recurrent carcinoma.   Original Report Authenticated By: Camelia Phenes, M.D.       Recent Labs: Lab Results  Component Value Date   WBC 5.2 12/30/2011   WBC 5.1 12/30/2011   HGB 12.3 12/30/2011   HGB 12.3 12/30/2011   HCT 38.0 12/30/2011   HCT 37.5 12/30/2011   PLT 275.0 12/30/2011   PLT 273.0 12/30/2011   GLUCOSE 103* 12/30/2011    CHOL 145 12/30/2011   TRIG 79.0 12/30/2011   HDL 56.60 12/30/2011   LDLCALC 73 12/30/2011   ALT 15 12/30/2011   AST 16 12/30/2011   NA 136 12/30/2011   K 3.7 12/30/2011   CL 103 12/30/2011   CREATININE 0.6 12/30/2011   BUN 17 12/30/2011   CO2 26 12/30/2011   TSH 0.02* 12/30/2011   INR 0.96 07/16/2011   HGBA1C 6.5 12/30/2011      Assessment / Plan:      p T2a, pN0 M0 well-differentiated adenocarcinoma of the right upper lobe resected 07/17/2011, ct scan today shows no evidence of recurrance Patient doing well postoperatively  Plan to see the patient back in 6 months for  follow up ct of chest.     Delight Ovens MD 02/04/2012 12:06 PM

## 2012-02-22 ENCOUNTER — Other Ambulatory Visit: Payer: Self-pay | Admitting: Dermatology

## 2012-02-22 DIAGNOSIS — L570 Actinic keratosis: Secondary | ICD-10-CM | POA: Diagnosis not present

## 2012-02-22 DIAGNOSIS — L820 Inflamed seborrheic keratosis: Secondary | ICD-10-CM | POA: Diagnosis not present

## 2012-02-22 DIAGNOSIS — D485 Neoplasm of uncertain behavior of skin: Secondary | ICD-10-CM | POA: Diagnosis not present

## 2012-02-24 DIAGNOSIS — M5126 Other intervertebral disc displacement, lumbar region: Secondary | ICD-10-CM | POA: Diagnosis not present

## 2012-05-27 ENCOUNTER — Other Ambulatory Visit: Payer: Self-pay | Admitting: Dermatology

## 2012-05-27 DIAGNOSIS — D485 Neoplasm of uncertain behavior of skin: Secondary | ICD-10-CM | POA: Diagnosis not present

## 2012-05-27 DIAGNOSIS — C44711 Basal cell carcinoma of skin of unspecified lower limb, including hip: Secondary | ICD-10-CM | POA: Diagnosis not present

## 2012-05-27 DIAGNOSIS — L570 Actinic keratosis: Secondary | ICD-10-CM | POA: Diagnosis not present

## 2012-05-27 DIAGNOSIS — L219 Seborrheic dermatitis, unspecified: Secondary | ICD-10-CM | POA: Diagnosis not present

## 2012-06-03 ENCOUNTER — Other Ambulatory Visit (INDEPENDENT_AMBULATORY_CARE_PROVIDER_SITE_OTHER): Payer: Medicare Other

## 2012-06-03 ENCOUNTER — Ambulatory Visit (INDEPENDENT_AMBULATORY_CARE_PROVIDER_SITE_OTHER): Payer: Medicare Other | Admitting: Cardiology

## 2012-06-03 ENCOUNTER — Encounter: Payer: Self-pay | Admitting: Cardiology

## 2012-06-03 VITALS — BP 144/80 | HR 65 | Resp 18 | Ht 62.0 in | Wt 126.0 lb

## 2012-06-03 DIAGNOSIS — I119 Hypertensive heart disease without heart failure: Secondary | ICD-10-CM

## 2012-06-03 DIAGNOSIS — E7800 Pure hypercholesterolemia, unspecified: Secondary | ICD-10-CM | POA: Diagnosis not present

## 2012-06-03 DIAGNOSIS — R0989 Other specified symptoms and signs involving the circulatory and respiratory systems: Secondary | ICD-10-CM

## 2012-06-03 DIAGNOSIS — E039 Hypothyroidism, unspecified: Secondary | ICD-10-CM

## 2012-06-03 LAB — BASIC METABOLIC PANEL
BUN: 16 mg/dL (ref 6–23)
CO2: 27 mEq/L (ref 19–32)
Calcium: 9.2 mg/dL (ref 8.4–10.5)
GFR: 110.05 mL/min (ref >60.00)
Glucose, Bld: 111 mg/dL — ABNORMAL HIGH (ref 70–99)
Potassium: 3.6 mEq/L (ref 3.5–5.1)
Sodium: 138 mEq/L (ref 135–145)

## 2012-06-03 LAB — LIPID PANEL
HDL: 49 mg/dL (ref >39.00)
Total CHOL/HDL Ratio: 3

## 2012-06-03 LAB — HEPATIC FUNCTION PANEL
ALT: 16 U/L (ref 0–35)
AST: 16 U/L (ref 0–37)
Bilirubin, Direct: 0.1 mg/dL (ref 0.0–0.3)
Total Bilirubin: 0.5 mg/dL (ref 0.3–1.2)

## 2012-06-03 NOTE — Progress Notes (Signed)
Samantha Quinn Date of Birth:  06-23-1927 [facility] [address] [place], [place]  [address] [phone]         Fax   [phone]  History of Present Illness: This pleasant 79 year old woman is seen for a followup office visit. The patient was hospitalized in March 2013 for a right lung mass and underwent surgery by Dr. Tyrone Sage for a adenocarcinoma of the right lung. She did very well postoperatively.  The patient has a past history of hypercholesterolemia and essential hypertension. She also has mild diabetes mellitus. She has hypothyroidism and a history of a thyroid nodule. She is on Synthroid. She does not have any history of ischemic heart disease.   Current Outpatient Prescriptions  Medication Sig Dispense Refill  . amitriptyline (ELAVIL) 10 MG tablet TAKE ONE TABLET BY MOUTH AT BEDTIME  90 tablet  2  . bisoprolol-hydrochlorothiazide (ZIAC) 2.5-6.25 MG per tablet Take 1 tablet by mouth daily.      . clobetasol ointment (TEMOVATE) 0.05 % as directed.      . fluorouracil (EFUDEX) 5 % cream as directed.      [name] ibuprofen (ADVIL,MOTRIN) 200 MG tablet Take 200 mg by mouth every 6 (six) hours as needed.      [name] levothyroxine (SYNTHROID, LEVOTHROID) 100 MCG tablet       . lovastatin (MEVACOR) 20 MG tablet Taking one every other day  45 tablet  3  . metFORMIN (GLUCOPHAGE) 500 MG tablet Take 500 mg by mouth 2 (two) times daily with a meal.      . ramipril (ALTACE) 2.5 MG capsule Take 2.5 mg by mouth daily.        Allergies  Allergen Reactions  . Codeine   . Demerol     nausea  . Tramadol     syncope    Patient Active Problem List  Diagnosis  . Benign hypertensive heart disease without heart failure  . Hypercholesterolemia  . Diabetes mellitus  . Hypothyroidism (acquired)  . Osteoarthritis  . Abnormal chest x-ray  . Primary adenocarcinoma of lung  . Pneumothorax after biopsy  . Anemia    History  Smoking status  . Never Smoker   Smokeless  tobacco  . Not on file    History  Alcohol Use No    Family History  Problem Relation Age of Onset  . Arthritis Sister   . Arthritis Sister   . Diabetes Brother   . Hypertension Mother   . Anesthesia problems Neg Hx   . Hypotension Neg Hx   . Malignant hyperthermia Neg Hx   . Pseudochol deficiency Neg Hx     Review of Systems: Constitutional: no fever chills diaphoresis or fatigue or change in weight.  Head and neck: no hearing loss, no epistaxis, no photophobia or visual disturbance. Respiratory: No cough, shortness of breath or wheezing. Cardiovascular: No chest pain peripheral edema, palpitations. Gastrointestinal: No abdominal distention, no abdominal pain, no change in bowel habits hematochezia or melena. Genitourinary: No dysuria, no frequency, no urgency, no nocturia. Musculoskeletal:No arthralgias, no back pain, no gait disturbance or myalgias. Neurological: No dizziness, no headaches, no numbness, no seizures, no syncope, no weakness, no tremors. Hematologic: No lymphadenopathy, no easy bruising. Psychiatric: No confusion, no hallucinations, no sleep disturbance.    Physical Exam: Filed Vitals:   06/03/12 0947  BP: 144/80  Pulse: 65  Resp: 18   the general appearance reveals a well-developed well-nourished woman in no distress.The head and neck exam reveals pupils equal  and reactive.  Extraocular movements are full.  There is no scleral icterus.  The mouth and pharynx are normal.  The neck is supple.  The carotids reveal no bruits.  The jugular venous pressure is normal.  The  thyroid is not enlarged.  There is no lymphadenopathy.  The chest is clear to percussion and auscultation.  There are no rales or rhonchi.  Expansion of the chest is symmetrical.  The precordium is quiet.  The first heart sound is normal.  The second heart sound is physiologically split.  There is no murmur gallop rub or click.  There is no abnormal lift or heave.  The abdomen is soft and  nontender.  The bowel sounds are normal.  The liver and spleen are not enlarged.  There are no abdominal masses.  There are no abdominal bruits.  Extremities reveal good pedal pulses.  There is no phlebitis or edema.  There is no cyanosis or clubbing.  Strength is normal and symmetrical in all extremities.  There is no lateralizing weakness.  There are no sensory deficits.  The skin is warm and dry.  There is no rash.     Assessment / Plan: Continue same medication.  Recheck in 4 months for office visit EKG lipid panel hepatic function panel and basal metabolic panel

## 2012-06-03 NOTE — Progress Notes (Signed)
Quick Note:  Please report to patient. The recent labs are stable. Continue same medication and careful diet. ______ 

## 2012-06-03 NOTE — Patient Instructions (Signed)
Will obtain labs today and call you with the results   Your physician recommends that you continue on your current medications as directed. Please refer to the Current Medication list given to you today.  Your physician recommends that you schedule a follow-up appointment in: 4 months with fasting labs (lp/bmet/hfp) and EKG

## 2012-06-03 NOTE — Assessment & Plan Note (Signed)
The patient has mild diabetes mellitus adult onset.  She has not been having any hypoglycemic episodes.  Today we are checking labs including an A1c

## 2012-06-03 NOTE — Assessment & Plan Note (Signed)
Her blood pressure today is slightly high systolic.  She attributes this to the traffic she encountered on the way to the office.  The patient has not been experiencing any increased shortness of breath.  She's had no symptoms of CHF.  No headaches or dizziness.  She previously had had some vertigo for which she took meclizine with good response

## 2012-06-03 NOTE — Assessment & Plan Note (Signed)
The patient has a history of hypercholesterolemia and she is on lovastatin 20 mg daily.  He has not been having any myalgias.  She does have chronic low back pain secondary to lumbar disc problems and is followed by a neurosurgeon Dr. Channing Mutters.  Because of her low back pain she is not able to walk for exercise very far.

## 2012-06-07 ENCOUNTER — Other Ambulatory Visit: Payer: Self-pay | Admitting: *Deleted

## 2012-06-07 ENCOUNTER — Telehealth: Payer: Self-pay | Admitting: *Deleted

## 2012-06-07 DIAGNOSIS — E119 Type 2 diabetes mellitus without complications: Secondary | ICD-10-CM

## 2012-06-07 NOTE — Telephone Encounter (Signed)
Message copied by Burnell Blanks on Tue Jun 07, 2012 12:58 PM ------      Message from: [name]      Created: Fri Jun 03, 2012  9:15 PM       Please report to patient.  The recent labs are stable. Continue same medication and careful diet. ------

## 2012-06-07 NOTE — Telephone Encounter (Signed)
Advised patient of lab results  

## 2012-06-14 ENCOUNTER — Telehealth: Payer: Self-pay | Admitting: Cardiology

## 2012-06-14 ENCOUNTER — Other Ambulatory Visit: Payer: Self-pay | Admitting: *Deleted

## 2012-06-14 MED ORDER — METFORMIN HCL 500 MG PO TABS: 500.0000 mg | ORAL_TABLET | Freq: Two times a day (BID) | ORAL | Status: DC

## 2012-06-14 MED ORDER — BISOPROLOL-HYDROCHLOROTHIAZIDE 2.5-6.25 MG PO TABS: 1.0000 | ORAL_TABLET | Freq: Every day | ORAL | Status: DC

## 2012-06-14 NOTE — Telephone Encounter (Signed)
Pt also needs [facility] [place], pt out of this too, pls call in

## 2012-06-14 NOTE — Telephone Encounter (Signed)
New problem    Need to discuss medication. Having problems with pharmacy.     Metformin  500 mg   [facility] in [place]  [phone].

## 2012-06-27 DIAGNOSIS — L219 Seborrheic dermatitis, unspecified: Secondary | ICD-10-CM | POA: Diagnosis not present

## 2012-06-27 DIAGNOSIS — C44711 Basal cell carcinoma of skin of unspecified lower limb, including hip: Secondary | ICD-10-CM | POA: Diagnosis not present

## 2012-07-15 ENCOUNTER — Other Ambulatory Visit: Payer: Self-pay

## 2012-07-15 DIAGNOSIS — C3490 Malignant neoplasm of unspecified part of unspecified bronchus or lung: Secondary | ICD-10-CM

## 2012-08-24 DIAGNOSIS — M76899 Other specified enthesopathies of unspecified lower limb, excluding foot: Secondary | ICD-10-CM | POA: Diagnosis not present

## 2012-08-30 DIAGNOSIS — L570 Actinic keratosis: Secondary | ICD-10-CM | POA: Diagnosis not present

## 2012-08-30 DIAGNOSIS — L578 Other skin changes due to chronic exposure to nonionizing radiation: Secondary | ICD-10-CM | POA: Diagnosis not present

## 2012-08-30 DIAGNOSIS — Z85828 Personal history of other malignant neoplasm of skin: Secondary | ICD-10-CM | POA: Diagnosis not present

## 2012-09-08 ENCOUNTER — Encounter: Payer: Self-pay | Admitting: Cardiothoracic Surgery

## 2012-09-08 ENCOUNTER — Ambulatory Visit
Admission: RE | Admit: 2012-09-08 | Discharge: 2012-09-08 | Disposition: A | Discharge status: Home / Self Care | Payer: Medicare Other | Source: Ambulatory Visit | Attending: Cardiothoracic Surgery | Admitting: Cardiothoracic Surgery

## 2012-09-08 ENCOUNTER — Ambulatory Visit (INDEPENDENT_AMBULATORY_CARE_PROVIDER_SITE_OTHER): Payer: Medicare Other | Admitting: Cardiothoracic Surgery

## 2012-09-08 VITALS — BP 130/68 | HR 68 | Resp 18 | Ht 62.0 in | Wt 126.0 lb

## 2012-09-08 DIAGNOSIS — Z9889 Other specified postprocedural states: Secondary | ICD-10-CM | POA: Diagnosis not present

## 2012-09-08 DIAGNOSIS — J984 Other disorders of lung: Secondary | ICD-10-CM | POA: Diagnosis not present

## 2012-09-08 DIAGNOSIS — C3491 Malignant neoplasm of unspecified part of right bronchus or lung: Secondary | ICD-10-CM

## 2012-09-08 DIAGNOSIS — C3490 Malignant neoplasm of unspecified part of unspecified bronchus or lung: Secondary | ICD-10-CM

## 2012-09-08 DIAGNOSIS — Z902 Acquired absence of lung [part of]: Secondary | ICD-10-CM

## 2012-09-08 NOTE — Progress Notes (Signed)
[address]            [place] [address]          [phone]                        KAILLY RICHOUX [facility] Medical Record #[id number] Date of Birth: 03-13-1928  Samantha Ovens, MD  Chief Complaint:   PostOp Follow Up Visit 07/17/2011  DATE OF DISCHARGE:  OPERATIVE REPORT  PREOPERATIVE DIAGNOSIS: Non-small-cell lung cancer, right upper lobe.  POSTOPERATIVE DIAGNOSIS: Non-small-cell lung cancer, right upper lobe.  SURGICAL PROCEDURES: Bronchoscopy, right video-assisted thoracoscopy,  right upper lobectomy with node dissection, and placement of On-Q  device.  p T2a, pN0 M0 well-differentiated adenocarcinoma of the right upper lobe resected 07/17/2011 ALK & EGFR negative   History of Present Illness:     Patient is an 79 year old female who underwent surgical resection of a 3.5 cm invasive well differentiated adenocarcinoma in the right upper lobe on 07/17/2011,S he's made excellent progress postoperatively nearly returning to Near-normal activities. Doing well without respiratory limitations. Currently her biggest limitation is the bursitis which has been chronic in her right hip. She notes she has had an MRI of the hip and several injections. She has no other bony pain      History  Smoking status  . Never Smoker   Smokeless tobacco  . Not on file       Allergies  Allergen Reactions  . Codeine   . Demerol     nausea  . Tramadol     syncope    Current Outpatient Prescriptions  Medication Sig Dispense Refill  . amitriptyline (ELAVIL) 10 MG tablet Take 10 mg by mouth. 1/2 tab at hs      . bisoprolol-hydrochlorothiazide (ZIAC) 2.5-6.25 MG per tablet Take 1 tablet by mouth daily.  30 tablet  5  . clobetasol ointment (TEMOVATE) 0.05 % as directed.      . fluorouracil (EFUDEX) 5 % cream as directed.      [name] ibuprofen (ADVIL,MOTRIN) 200 MG tablet Take 200 mg by mouth every 6 (six) hours as needed.      [name]  levothyroxine (SYNTHROID, LEVOTHROID) 100 MCG tablet       . lovastatin (MEVACOR) 20 MG tablet Taking one every other day  45 tablet  3  . metFORMIN (GLUCOPHAGE) 500 MG tablet Take 1 tablet (500 mg total) by mouth 2 (two) times daily with a meal.  180 tablet  0  . ramipril (ALTACE) 2.5 MG capsule Take 2.5 mg by mouth daily.       No current facility-administered medications for this visit.       Physical Exam: BP 130/68  Pulse 68  Resp 18  Ht 5\' 2"  (1.575 m)  Wt 126 lb (57.153 kg)  BMI 23.04 kg/m2  SpO2 98%  General appearance: alert and cooperative Neurologic: intact Neck: The right thyroid lobe is slightly more prominent than the left. Heart: regular rate and rhythm, S1, S2 normal, no murmur, click, rub or gallop and normal apical impulse Lungs: clear to auscultation bilaterally and normal percussion bilaterally Abdomen: soft, non-tender; bowel sounds normal; no masses,  no organomegaly Extremities: extremities normal, atraumatic, no cyanosis or edema Wound: Her right chest wound and chest  tube sites are well-healed I do not appreciate any cervical or supraclavicular adenopathy  Diagnostic Studies & Laboratory data:         Recent Radiology Findings: Ct Chest Wo Contrast  09/08/2012   *RADIOLOGY REPORT*  Clinical Data: History of right upper lobectomy for resection of non-small cell lung carcinoma, follow-up  CT CHEST WITHOUT CONTRAST  Technique:  Multidetector CT imaging of the chest was performed following the standard protocol without IV contrast.  Comparison: CT chest of 06/13/2011  Findings: The lobular mass noted previously within the right upper lobe has been resected via right upper lobectomy.  Minimal linear scarring is noted peripherally at the right lung base.  No parenchymal nodule or mass is seen.  No infiltrate is noted and there is no evidence of pleural effusion.  On soft tissue window images, the thyroid gland is asymmetrically enlarged right larger than left most  consistent with asymmetrical thyroid goiter.  The ascending aorta measures 3.6 cm in maximum diameter.  No mediastinal or hilar adenopathy is seen.    A large peripherally calcified splenic cyst is again noted and appears stable.  The central airway is patent.  IMPRESSION:  1.  Stable postop change after right upper lobectomy.  No evidence of metastatic involvement the lungs. 2.  No mediastinal or hilar adenopathy. 3.  Probable asymmetrical thyroid goiter right larger than left. 4.  3.6 cm in diameter ascending aorta.   Original Report Authenticated By: Dwyane Dee, M.D.      Recent Labs: Lab Results  Component Value Date   WBC 5.2 12/30/2011   WBC 5.1 12/30/2011   HGB 12.3 12/30/2011   HGB 12.3 12/30/2011   HCT 38.0 12/30/2011   HCT 37.5 12/30/2011   PLT 275.0 12/30/2011   PLT 273.0 12/30/2011   GLUCOSE 111* 06/03/2012   CHOL 153 06/03/2012   TRIG 107.0 06/03/2012   HDL 49.00 06/03/2012   LDLCALC 83 06/03/2012   ALT 16 06/03/2012   AST 16 06/03/2012   NA 138 06/03/2012   K 3.6 06/03/2012   CL 103 06/03/2012   CREATININE 0.6 06/03/2012   BUN 16 06/03/2012   CO2 27 06/03/2012   TSH 0.02* 06/03/2012   INR 0.96 07/16/2011   HGBA1C 6.5 12/30/2011      Assessment / Plan:      p T2a, pN0 M0 well-differentiated adenocarcinoma of the right upper lobe resected 07/17/2011, Followup CT scan now approximately one year following surgical resection with right upper lobectomy shows no evidence of recurrent carcinoma the lung Her descending aorta is very mildly dilated, 3.6 cm. The right lobe of the thyroid is slightly enlarged consistent with goiter which she is being treated for.  Samantha Ovens MD 09/08/2012 11:40 AM

## 2012-09-08 NOTE — Patient Instructions (Signed)
Return in 6 months for follow chest ct scan

## 2012-09-09 ENCOUNTER — Telehealth: Payer: Self-pay | Admitting: Cardiology

## 2012-09-09 DIAGNOSIS — E119 Type 2 diabetes mellitus without complications: Secondary | ICD-10-CM

## 2012-09-09 MED ORDER — METFORMIN HCL 500 MG PO TABS: 500.0000 mg | ORAL_TABLET | Freq: Two times a day (BID) | ORAL | Status: DC

## 2012-09-09 NOTE — Telephone Encounter (Signed)
New Problem:    Patient called in wanting to speak with you regarding one of her medications.  Please call back.

## 2012-09-09 NOTE — Telephone Encounter (Signed)
Refilled Metformin as requested  

## 2012-09-12 DIAGNOSIS — M76899 Other specified enthesopathies of unspecified lower limb, excluding foot: Secondary | ICD-10-CM | POA: Diagnosis not present

## 2012-09-29 ENCOUNTER — Other Ambulatory Visit (INDEPENDENT_AMBULATORY_CARE_PROVIDER_SITE_OTHER): Payer: Self-pay

## 2012-09-29 ENCOUNTER — Encounter: Payer: Self-pay | Admitting: Cardiology

## 2012-09-29 ENCOUNTER — Ambulatory Visit (INDEPENDENT_AMBULATORY_CARE_PROVIDER_SITE_OTHER): Payer: Medicare Other | Admitting: Cardiology

## 2012-09-29 VITALS — BP 126/70 | HR 67 | Ht 61.0 in | Wt 125.8 lb

## 2012-09-29 DIAGNOSIS — E7800 Pure hypercholesterolemia, unspecified: Secondary | ICD-10-CM

## 2012-09-29 DIAGNOSIS — E042 Nontoxic multinodular goiter: Secondary | ICD-10-CM | POA: Diagnosis not present

## 2012-09-29 DIAGNOSIS — E039 Hypothyroidism, unspecified: Secondary | ICD-10-CM

## 2012-09-29 DIAGNOSIS — E119 Type 2 diabetes mellitus without complications: Secondary | ICD-10-CM

## 2012-09-29 DIAGNOSIS — I119 Hypertensive heart disease without heart failure: Secondary | ICD-10-CM | POA: Diagnosis not present

## 2012-09-29 DIAGNOSIS — R0989 Other specified symptoms and signs involving the circulatory and respiratory systems: Secondary | ICD-10-CM

## 2012-09-29 DIAGNOSIS — M1990 Unspecified osteoarthritis, unspecified site: Secondary | ICD-10-CM

## 2012-09-29 LAB — LIPID PANEL
Cholesterol: 181 mg/dL (ref 0–200)
HDL: 47.5 mg/dL (ref >39.00)
LDL Cholesterol: 108 mg/dL — ABNORMAL HIGH (ref 0–99)
VLDL: 25.2 mg/dL (ref 0.0–40.0)

## 2012-09-29 LAB — TSH: TSH: 0.02 u[IU]/mL — ABNORMAL LOW (ref 0.35–5.50)

## 2012-09-29 LAB — BASIC METABOLIC PANEL
Chloride: 105 mEq/L (ref 96–112)
GFR: 114.68 mL/min (ref >60.00)
Potassium: 3.9 mEq/L (ref 3.5–5.1)
Sodium: 140 mEq/L (ref 135–145)

## 2012-09-29 LAB — HEPATIC FUNCTION PANEL
ALT: 15 U/L (ref 0–35)
Total Protein: 6.9 g/dL (ref 6.0–8.3)

## 2012-09-29 LAB — HEMOGLOBIN A1C: Hgb A1c MFr Bld: 6.5 % (ref 4.6–6.5)

## 2012-09-29 NOTE — Assessment & Plan Note (Signed)
The patient is not having any hypoglycemic episodes 

## 2012-09-29 NOTE — Assessment & Plan Note (Signed)
She has a history of bursitis of the right hip and the last week she saw Dr. Terrilee Croak who gave her a injection of steroids in the right hip with clinical improvement.

## 2012-09-29 NOTE — Progress Notes (Signed)
[name] Date of Birth:  03/22/1928 [facility] [address] [place], [place]  [address] [phone]         Fax   [phone]  History of Present Illness: This pleasant 79 year old woman is seen for a followup office visit. The patient was hospitalized in March 2013 for a right lung mass and underwent surgery by Dr. Tyrone Sage for a adenocarcinoma of the right lung. She did very well postoperatively.  The patient has a past history of hypercholesterolemia and essential hypertension. She also has mild diabetes mellitus. She has hypothyroidism and a history of a multinodular goiter which has been known since 72. She is on Synthroid. She does not have any history of ischemic heart disease.   Current Outpatient Prescriptions  Medication Sig Dispense Refill  . amitriptyline (ELAVIL) 10 MG tablet Take 10 mg by mouth. 1/2 tab at hs      . bisoprolol-hydrochlorothiazide (ZIAC) 2.5-6.25 MG per tablet Take 1 tablet by mouth daily.  30 tablet  5  . clobetasol ointment (TEMOVATE) 0.05 % as directed.      . fluorouracil (EFUDEX) 5 % cream as directed.      [name] ibuprofen (ADVIL,MOTRIN) 200 MG tablet Take 200 mg by mouth every 6 (six) hours as needed.      [name] levothyroxine (SYNTHROID, LEVOTHROID) 100 MCG tablet       . lovastatin (MEVACOR) 20 MG tablet Taking one every other day  45 tablet  3  . metFORMIN (GLUCOPHAGE) 500 MG tablet Take 1 tablet (500 mg total) by mouth 2 (two) times daily with a meal.  180 tablet  0  . ramipril (ALTACE) 2.5 MG capsule Take 2.5 mg by mouth daily.       No current facility-administered medications for this visit.    Allergies  Allergen Reactions  . Codeine   . Demerol     nausea  . Tramadol     syncope    Patient Active Problem List   Diagnosis Date Noted  . Multinodular goiter (nontoxic) 09/29/2012  . Anemia 09/09/2011  . Pneumothorax after biopsy 07/09/2011    Class: Acute  . Primary adenocarcinoma of lung 06/24/2011  .  Abnormal chest x-ray 06/18/2011  . Benign hypertensive heart disease without heart failure 10/20/2010  . Hypercholesterolemia 10/20/2010  . Diabetes mellitus 10/20/2010  . Hypothyroidism (acquired) 10/20/2010  . Osteoarthritis 10/20/2010    History  Smoking status  . Never Smoker   Smokeless tobacco  . Not on file    History  Alcohol Use No    Family History  Problem Relation Age of Onset  . Arthritis Sister   . Arthritis Sister   . Diabetes Brother   . Hypertension Mother   . Anesthesia problems Neg Hx   . Hypotension Neg Hx   . Malignant hyperthermia Neg Hx   . Pseudochol deficiency Neg Hx     Review of Systems: Constitutional: no fever chills diaphoresis or fatigue or change in weight.  Head and neck: no hearing loss, no epistaxis, no photophobia or visual disturbance. Respiratory: No cough, shortness of breath or wheezing. Cardiovascular: No chest pain peripheral edema, palpitations. Gastrointestinal: No abdominal distention, no abdominal pain, no change in bowel habits hematochezia or melena. Genitourinary: No dysuria, no frequency, no urgency, no nocturia. Musculoskeletal:No arthralgias, no back pain, no gait disturbance or myalgias. Neurological: No dizziness, no headaches, no numbness, no seizures, no syncope, no weakness, no tremors. Hematologic: No lymphadenopathy, no easy bruising. Psychiatric: No confusion, no  hallucinations, no sleep disturbance.    Physical Exam: Filed Vitals:   09/29/12 1004  BP: 126/70  Pulse: 67   the general appearance reveals a well-developed well-nourished woman in no distress.The head and neck exam reveals pupils equal and reactive.  Extraocular movements are full.  There is no scleral icterus.  The mouth and pharynx are normal.  The neck is supple.  The carotids reveal no bruits.  The jugular venous pressure is normal.  The  thyroid is enlarged with a palpable multinodular goiter especially prominent on the right side.  There is  no lymphadenopathy.  The chest is clear to percussion and auscultation.  There are no rales or rhonchi.  Expansion of the chest is symmetrical.  The precordium is quiet.  The first heart sound is normal.  The second heart sound is physiologically split.  There is no murmur gallop rub or click.  There is no abnormal lift or heave.  The abdomen is soft and nontender.  The bowel sounds are normal.  The liver and spleen are not enlarged.  There are no abdominal masses.  There are no abdominal bruits.  Extremities reveal good pedal pulses.  There is no phlebitis or edema.  There is no cyanosis or clubbing.  Strength is normal and symmetrical in all extremities.  There is no lateralizing weakness.  There are no sensory deficits.  The skin is warm and dry.  There is no rash.  EKG shows normal sinus rhythm and is within normal limits   Assessment / Plan: Continue same medication.  Recheck in 4 months for followup office visit fasting lipid panel hepatic function panel and basal metabolic panel.

## 2012-09-29 NOTE — Assessment & Plan Note (Signed)
Patient is on lovastatin for hypercholesterolemia.  She's not having any side effects.  We are checking labs.

## 2012-09-29 NOTE — Assessment & Plan Note (Signed)
The patient feels that her goiter may have gotten a little larger.  She is concerned whether she is on the right dose of Synthroid generic or not.  We will check a TSH and a free T4 today.  She was found to have a bilateral multinodular goiter in 1996 when she had an ultrasound.  She had a repeat ultrasound in 2001 again demonstrating a bilateral multinodular goiter.  She had a PET scan in 2013 for workup of her lung cancer and at that time there was no evidence of any hyperactivity in the thyroid gland.

## 2012-09-29 NOTE — Patient Instructions (Signed)
Will obtain labs today and call you with the results (lp/bmet/hfp/a1c/tsh/ft4)  Your physician recommends that you continue on your current medications as directed. Please refer to the Current Medication list given to you today.  Your physician wants you to follow-up in: 4 months with fasting labs (lp/bmet/hfp)  You will receive a reminder letter in the mail two months in advance. If you don't receive a letter, please call our office to schedule the follow-up appointment.

## 2012-09-30 NOTE — Progress Notes (Signed)
Quick Note:  Please report to patient. The recent labs are stable. Continue same medication and careful diet. The thyroid level is normal. Continue same dose of generic Synthroid. The hemoglobin A1c is stable at 6.5. The LDL is slightly higher. Watch cholesterol in diet carefully. The liver tests are normal. ______

## 2012-10-03 ENCOUNTER — Other Ambulatory Visit: Payer: Self-pay | Admitting: *Deleted

## 2012-10-03 ENCOUNTER — Telehealth: Payer: Self-pay | Admitting: *Deleted

## 2012-10-03 MED ORDER — RAMIPRIL 2.5 MG PO CAPS: 2.5000 mg | ORAL_CAPSULE | Freq: Every day | ORAL | Status: DC

## 2012-10-03 NOTE — Telephone Encounter (Signed)
Message copied by Burnell Blanks on Mon Oct 03, 2012  5:15 PM ------      Message from: [name]      Created: Fri Sep 30, 2012 12:12 PM       Please report to patient.  The recent labs are stable. Continue same medication and careful diet.  The thyroid level is normal.  Continue same dose of generic Synthroid.      The hemoglobin A1c is stable at 6.5.      The LDL is slightly higher.  Watch cholesterol in diet carefully.      The liver tests are normal. ------

## 2012-10-03 NOTE — Telephone Encounter (Signed)
Mailed copy of labs and left message to call if any questions  

## 2012-10-13 DIAGNOSIS — H35379 Puckering of macula, unspecified eye: Secondary | ICD-10-CM | POA: Diagnosis not present

## 2012-10-13 DIAGNOSIS — H353190 Nonexudative age-related macular degeneration, unspecified eye, stage unspecified: Secondary | ICD-10-CM | POA: Diagnosis not present

## 2012-10-13 DIAGNOSIS — H04209 Unspecified epiphora, unspecified lacrimal gland: Secondary | ICD-10-CM | POA: Diagnosis not present

## 2012-10-13 DIAGNOSIS — E119 Type 2 diabetes mellitus without complications: Secondary | ICD-10-CM | POA: Diagnosis not present

## 2012-10-13 DIAGNOSIS — H2510 Age-related nuclear cataract, unspecified eye: Secondary | ICD-10-CM | POA: Diagnosis not present

## 2012-10-13 DIAGNOSIS — H04129 Dry eye syndrome of unspecified lacrimal gland: Secondary | ICD-10-CM | POA: Diagnosis not present

## 2012-10-13 DIAGNOSIS — H35369 Drusen (degenerative) of macula, unspecified eye: Secondary | ICD-10-CM | POA: Diagnosis not present

## 2012-11-07 ENCOUNTER — Other Ambulatory Visit: Payer: Self-pay | Admitting: Cardiology

## 2012-11-07 DIAGNOSIS — R42 Dizziness and giddiness: Secondary | ICD-10-CM

## 2012-11-07 NOTE — Telephone Encounter (Signed)
Spoke with patient who c/o "vertigo" x 3-4 weeks.  Patient states it is not severe, "just aggravating" and that she takes Meclizine as needed, which Dr. Patty Sermons prescribed years ago.  Patient states she was diagnosed with vertigo years ago and that the symptoms she is having at present are the same as in the past.  Patient states she has no refills on the Meclizine and would like [place] to send a new Rx.  I advised patient that Dr. Patty Sermons is out of the office all week.  I advised her that I could send the message to Regis Bill, LPN, Dr. Yevonne Pax nurse, who would be in the office after today or I could talk with our DOD about this problem.  Patient states that she prefers to talk with Juliette Alcide and verbalized understanding that I would route message to her but that she would not be able to speak with her today.  Patient states she has 1 tab of Meclizine left and that she will use it today as needed.

## 2012-11-07 NOTE — Telephone Encounter (Signed)
New Problem  Pt states she is having vertigo.  She states that she is feeling dizzy and it has been going on for about 3-4 weeks. She said she has been taking some medicine that he had given her in the past but she is not out of it.

## 2012-11-08 ENCOUNTER — Other Ambulatory Visit: Payer: Self-pay | Admitting: Cardiology

## 2012-11-08 MED ORDER — MECLIZINE HCL 25 MG PO TABS: 25.0000 mg | ORAL_TABLET | Freq: Three times a day (TID) | ORAL | Status: DC | PRN

## 2012-11-08 NOTE — Telephone Encounter (Signed)
Okay to refill meclizine 

## 2012-11-08 NOTE — Telephone Encounter (Signed)
Advised patient

## 2012-11-08 NOTE — Telephone Encounter (Signed)
Advised if no better to call back, verbalized understanding.

## 2012-11-08 NOTE — Telephone Encounter (Signed)
Follow up  ° ° ° ° °Pt is returning your call  °

## 2012-11-08 NOTE — Telephone Encounter (Signed)
Left message to call back  

## 2012-11-10 ENCOUNTER — Other Ambulatory Visit: Payer: Self-pay

## 2012-11-10 MED ORDER — BISOPROLOL-HYDROCHLOROTHIAZIDE 2.5-6.25 MG PO TABS: 1.0000 | ORAL_TABLET | Freq: Every day | ORAL | Status: DC

## 2012-11-24 DIAGNOSIS — M5137 Other intervertebral disc degeneration, lumbosacral region: Secondary | ICD-10-CM | POA: Diagnosis not present

## 2012-11-24 DIAGNOSIS — M76899 Other specified enthesopathies of unspecified lower limb, excluding foot: Secondary | ICD-10-CM | POA: Diagnosis not present

## 2012-12-13 ENCOUNTER — Telehealth: Payer: Self-pay | Admitting: Cardiology

## 2012-12-13 DIAGNOSIS — E119 Type 2 diabetes mellitus without complications: Secondary | ICD-10-CM

## 2012-12-13 NOTE — Telephone Encounter (Signed)
Pt would like to speak with Samantha Quinn Dr. Patty Sermons nurse regarding Metformin medication. Pt is aware that melinda is scheduled to be in the office tomorrow 12/14/12. Pt woul like  to be called tomorrow.

## 2012-12-13 NOTE — Telephone Encounter (Signed)
New Problem  Pt had inquiry about medication/// States she needs a refill line.Jamal Maes to send her to the Medication department to help// Pt was stern about getting this information to the nurse. Please assist

## 2012-12-14 MED ORDER — METFORMIN HCL 500 MG PO TABS: 500.0000 mg | ORAL_TABLET | Freq: Two times a day (BID) | ORAL | Status: DC

## 2012-12-14 NOTE — Telephone Encounter (Signed)
Sent to pharmacy as requested 

## 2012-12-14 NOTE — Telephone Encounter (Signed)
Busy, left message to call back on cell number

## 2012-12-28 ENCOUNTER — Other Ambulatory Visit: Payer: Self-pay | Admitting: Dermatology

## 2012-12-28 DIAGNOSIS — Z85828 Personal history of other malignant neoplasm of skin: Secondary | ICD-10-CM | POA: Diagnosis not present

## 2012-12-28 DIAGNOSIS — D239 Other benign neoplasm of skin, unspecified: Secondary | ICD-10-CM | POA: Diagnosis not present

## 2012-12-28 DIAGNOSIS — L570 Actinic keratosis: Secondary | ICD-10-CM | POA: Diagnosis not present

## 2012-12-28 DIAGNOSIS — D485 Neoplasm of uncertain behavior of skin: Secondary | ICD-10-CM | POA: Diagnosis not present

## 2012-12-28 DIAGNOSIS — D1801 Hemangioma of skin and subcutaneous tissue: Secondary | ICD-10-CM | POA: Diagnosis not present

## 2012-12-28 DIAGNOSIS — L821 Other seborrheic keratosis: Secondary | ICD-10-CM | POA: Diagnosis not present

## 2013-01-12 DIAGNOSIS — Z23 Encounter for immunization: Secondary | ICD-10-CM | POA: Diagnosis not present

## 2013-01-28 ENCOUNTER — Other Ambulatory Visit: Payer: Self-pay | Admitting: Cardiology

## 2013-02-01 ENCOUNTER — Ambulatory Visit (INDEPENDENT_AMBULATORY_CARE_PROVIDER_SITE_OTHER): Payer: Medicare Other | Admitting: Cardiology

## 2013-02-01 ENCOUNTER — Encounter: Payer: Self-pay | Admitting: Cardiology

## 2013-02-01 VITALS — BP 124/78 | HR 66 | Ht 61.0 in | Wt 128.0 lb

## 2013-02-01 DIAGNOSIS — E042 Nontoxic multinodular goiter: Secondary | ICD-10-CM

## 2013-02-01 DIAGNOSIS — E7800 Pure hypercholesterolemia, unspecified: Secondary | ICD-10-CM | POA: Diagnosis not present

## 2013-02-01 DIAGNOSIS — I119 Hypertensive heart disease without heart failure: Secondary | ICD-10-CM

## 2013-02-01 DIAGNOSIS — E119 Type 2 diabetes mellitus without complications: Secondary | ICD-10-CM

## 2013-02-01 LAB — BASIC METABOLIC PANEL
Calcium: 9.2 mg/dL (ref 8.4–10.5)
GFR: 97.69 mL/min (ref >60.00)
Glucose, Bld: 122 mg/dL — ABNORMAL HIGH (ref 70–99)
Potassium: 3.9 mEq/L (ref 3.5–5.1)
Sodium: 142 mEq/L (ref 135–145)

## 2013-02-01 LAB — LIPID PANEL
Cholesterol: 150 mg/dL (ref 0–200)
HDL: 48.4 mg/dL (ref >39.00)
Triglycerides: 100 mg/dL (ref 0.0–149.0)
VLDL: 20 mg/dL (ref 0.0–40.0)

## 2013-02-01 LAB — HEPATIC FUNCTION PANEL
ALT: 15 U/L (ref 0–35)
AST: 18 U/L (ref 0–37)
Albumin: 4 g/dL (ref 3.5–5.2)
Total Protein: 6.9 g/dL (ref 6.0–8.3)

## 2013-02-01 NOTE — Assessment & Plan Note (Signed)
The patient has a known multinodular goiter.  On exam the size has not increased.  We checked her thyroid function at her previous office visit 4 months ago and is satisfactory on current dose of Synthroid.

## 2013-02-01 NOTE — Assessment & Plan Note (Signed)
Blood pressure was remaining stable on current therapy.  No headaches dizziness or syncope. 

## 2013-02-01 NOTE — Progress Notes (Signed)
Quick Note:  Please report to patient. The recent labs are stable. Continue same medication and careful diet. ______ 

## 2013-02-01 NOTE — Assessment & Plan Note (Signed)
The patient has a history of hypercholesterolemia.  She is on lovastatin.  She's not having any myalgias.  Blood work today is pending.

## 2013-02-01 NOTE — Progress Notes (Signed)
Samantha Quinn Date of Birth:  07-31-1927 [facility] [address] [place], [place]  [address] [phone]         Fax   [phone]  History of Present Illness: This pleasant 79 year old woman is seen for a followup office visit. The patient was hospitalized in March 2013 for a right lung mass and underwent surgery by Dr. Tyrone Sage for a adenocarcinoma of the right lung. She did very well postoperatively.  The patient has a past history of hypercholesterolemia and essential hypertension. She also has mild diabetes mellitus. She has hypothyroidism and a history of a multinodular goiter which has been known since 40. She is on Synthroid. She does not have any history of ischemic heart disease.   Current Outpatient Prescriptions  Medication Sig Dispense Refill  . amitriptyline (ELAVIL) 10 MG tablet Take 10 mg by mouth. 1/2 tab at hs      . bisoprolol-hydrochlorothiazide (ZIAC) 2.5-6.25 MG per tablet Take 1 tablet by mouth daily.  90 tablet  1  . clobetasol ointment (TEMOVATE) 0.05 % as directed.      . fluorouracil (EFUDEX) 5 % cream as directed.      [name] ibuprofen (ADVIL,MOTRIN) 200 MG tablet Take 200 mg by mouth every 6 (six) hours as needed.      [name] levothyroxine (SYNTHROID, LEVOTHROID) 100 MCG tablet TAKE ONE TABLET BY MOUTH EVERY DAY  90 tablet  3  . lovastatin (MEVACOR) 20 MG tablet TAKE ONE TABLET BY MOUTH EVERY OTHER  DAY  45 tablet  2  . meclizine (ANTIVERT) 25 MG tablet Take 1 tablet (25 mg total) by mouth 3 (three) times daily as needed.  30 tablet  5  . metFORMIN (GLUCOPHAGE) 500 MG tablet Take 1 tablet (500 mg total) by mouth 2 (two) times daily with a meal.  180 tablet  3  . ramipril (ALTACE) 2.5 MG capsule Take 1 capsule (2.5 mg total) by mouth daily.  90 capsule  3   No current facility-administered medications for this visit.    Allergies  Allergen Reactions  . Codeine   . Demerol     nausea  . Tramadol     syncope    Patient Active  Problem List   Diagnosis Date Noted  . Multinodular goiter (nontoxic) 09/29/2012  . Anemia 09/09/2011  . Pneumothorax after biopsy 07/09/2011    Class: Acute  . Primary adenocarcinoma of lung 06/24/2011  . Abnormal chest x-ray 06/18/2011  . Benign hypertensive heart disease without heart failure 10/20/2010  . Hypercholesterolemia 10/20/2010  . Type II or unspecified type diabetes mellitus without mention of complication, uncontrolled 10/20/2010  . Hypothyroidism (acquired) 10/20/2010  . Osteoarthritis 10/20/2010    History  Smoking status  . Never Smoker   Smokeless tobacco  . Not on file    History  Alcohol Use No    Family History  Problem Relation Age of Onset  . Arthritis Sister   . Arthritis Sister   . Diabetes Brother   . Hypertension Mother   . Anesthesia problems Neg Hx   . Hypotension Neg Hx   . Malignant hyperthermia Neg Hx   . Pseudochol deficiency Neg Hx     Review of Systems: Constitutional: no fever chills diaphoresis or fatigue or change in weight.  Head and neck: no hearing loss, no epistaxis, no photophobia or visual disturbance. Respiratory: No cough, shortness of breath or wheezing. Cardiovascular: No chest pain peripheral edema, palpitations. Gastrointestinal: No abdominal distention, no  abdominal pain, no change in bowel habits hematochezia or melena. Genitourinary: No dysuria, no frequency, no urgency, no nocturia. Musculoskeletal:No arthralgias, no back pain, no gait disturbance or myalgias. Neurological: No dizziness, no headaches, no numbness, no seizures, no syncope, no weakness, no tremors. Hematologic: No lymphadenopathy, no easy bruising. Psychiatric: No confusion, no hallucinations, no sleep disturbance.    Physical Exam: Filed Vitals:   02/01/13 0907  BP: 124/78  Pulse: 66   the general appearance reveals a well-developed well-nourished woman in no distress.The head and neck exam reveals pupils equal and reactive.  Extraocular  movements are full.  There is no scleral icterus.  The mouth and pharynx are normal.  The neck is supple.  The carotids reveal no bruits.  The jugular venous pressure is normal.  The  thyroid is enlarged with a palpable multinodular goiter especially prominent on the right side.  There is no lymphadenopathy.  The chest is clear to percussion and auscultation.  There are no rales or rhonchi.  Expansion of the chest is symmetrical.  The precordium is quiet.  The first heart sound is normal.  The second heart sound is physiologically split.  There is no murmur gallop rub or click.  There is no abnormal lift or heave.  The abdomen is soft and nontender.  The bowel sounds are normal.  The liver and spleen are not enlarged.  There are no abdominal masses.  There are no abdominal bruits.  Extremities reveal good pedal pulses.  There is no phlebitis or edema.  There is no cyanosis or clubbing.  Strength is normal and symmetrical in all extremities.  There is no lateralizing weakness.  There are no sensory deficits.  The skin is warm and dry.  There is no rash.    Assessment / Plan: Patient is doing well.  She will continue same medication.  She'll return in 4 months for office visit CBC free T4 TSH lipid panel hepatic function panel and basal metabolic panel.

## 2013-02-01 NOTE — Patient Instructions (Signed)
Will obtain labs today and call you with the results (lp/bmet/hfp)  Your physician recommends that you continue on your current medications as directed. Please refer to the Current Medication list given to you today.  Your physician wants you to follow-up in: 4 months with fasting labs (lp/bmet/hfp/tsh/t54f/cbc) You will receive a reminder letter in the mail two months in advance. If you don't receive a letter, please call our office to schedule the follow-up appointment.

## 2013-02-02 ENCOUNTER — Telehealth: Payer: Self-pay | Admitting: *Deleted

## 2013-02-02 NOTE — Telephone Encounter (Signed)
Message copied by Burnell Blanks on Thu Feb 02, 2013 10:19 AM ------      Message from: [name]      Created: Wed Feb 01, 2013  8:24 PM       Please report to patient.  The recent labs are stable. Continue same medication and careful diet. ------

## 2013-02-02 NOTE — Telephone Encounter (Signed)
Advised patient of lab results  

## 2013-02-09 ENCOUNTER — Other Ambulatory Visit: Payer: Self-pay | Admitting: *Deleted

## 2013-02-09 ENCOUNTER — Other Ambulatory Visit: Payer: Self-pay

## 2013-02-09 MED ORDER — AMITRIPTYLINE HCL 10 MG PO TABS: ORAL_TABLET | ORAL | Status: DC

## 2013-02-10 DIAGNOSIS — L821 Other seborrheic keratosis: Secondary | ICD-10-CM | POA: Diagnosis not present

## 2013-02-10 DIAGNOSIS — D485 Neoplasm of uncertain behavior of skin: Secondary | ICD-10-CM | POA: Diagnosis not present

## 2013-02-10 DIAGNOSIS — L570 Actinic keratosis: Secondary | ICD-10-CM | POA: Diagnosis not present

## 2013-02-10 DIAGNOSIS — Z85828 Personal history of other malignant neoplasm of skin: Secondary | ICD-10-CM | POA: Diagnosis not present

## 2013-02-24 ENCOUNTER — Other Ambulatory Visit: Payer: Self-pay | Admitting: *Deleted

## 2013-02-27 DIAGNOSIS — M25519 Pain in unspecified shoulder: Secondary | ICD-10-CM | POA: Diagnosis not present

## 2013-02-27 DIAGNOSIS — M76899 Other specified enthesopathies of unspecified lower limb, excluding foot: Secondary | ICD-10-CM | POA: Diagnosis not present

## 2013-03-16 ENCOUNTER — Ambulatory Visit: Payer: Medicare Other | Admitting: Cardiothoracic Surgery

## 2013-03-17 ENCOUNTER — Other Ambulatory Visit: Payer: Self-pay | Admitting: Dermatology

## 2013-03-17 DIAGNOSIS — Z85828 Personal history of other malignant neoplasm of skin: Secondary | ICD-10-CM | POA: Diagnosis not present

## 2013-03-17 DIAGNOSIS — C44621 Squamous cell carcinoma of skin of unspecified upper limb, including shoulder: Secondary | ICD-10-CM | POA: Diagnosis not present

## 2013-03-17 DIAGNOSIS — L570 Actinic keratosis: Secondary | ICD-10-CM | POA: Diagnosis not present

## 2013-03-17 DIAGNOSIS — D485 Neoplasm of uncertain behavior of skin: Secondary | ICD-10-CM | POA: Diagnosis not present

## 2013-03-30 ENCOUNTER — Encounter: Payer: Self-pay | Admitting: Cardiothoracic Surgery

## 2013-03-30 ENCOUNTER — Ambulatory Visit (INDEPENDENT_AMBULATORY_CARE_PROVIDER_SITE_OTHER): Payer: Medicare Other | Admitting: Cardiothoracic Surgery

## 2013-03-30 ENCOUNTER — Ambulatory Visit
Admission: RE | Admit: 2013-03-30 | Discharge: 2013-03-30 | Disposition: A | Discharge status: Home / Self Care | Payer: Medicare Other | Source: Ambulatory Visit | Attending: Cardiothoracic Surgery | Admitting: Cardiothoracic Surgery

## 2013-03-30 DIAGNOSIS — C3490 Malignant neoplasm of unspecified part of unspecified bronchus or lung: Secondary | ICD-10-CM | POA: Diagnosis not present

## 2013-03-30 DIAGNOSIS — C3410 Malignant neoplasm of upper lobe, unspecified bronchus or lung: Secondary | ICD-10-CM

## 2013-03-30 DIAGNOSIS — Z9889 Other specified postprocedural states: Secondary | ICD-10-CM

## 2013-03-30 DIAGNOSIS — Z902 Acquired absence of lung [part of]: Secondary | ICD-10-CM

## 2013-03-30 NOTE — Progress Notes (Signed)
[address]       [place] [address]             [phone]                       Samantha Quinn [facility] Medical Record #[id number] Date of Birth: 18-Oct-1927  Samantha Clement, MD  Chief Complaint:   PostOp Follow Up Visit 07/17/2011  OPERATIVE REPORT  PREOPERATIVE DIAGNOSIS: Non-small-cell lung cancer, right upper lobe.  POSTOPERATIVE DIAGNOSIS: Non-small-cell lung cancer, right upper lobe.  SURGICAL PROCEDURES: Bronchoscopy, right video-assisted thoracoscopy,  right upper lobectomy with node dissection, and placement of On-Q  device.   p T2a, pN0 M0 well-differentiated adenocarcinoma of the right upper lobe resected 07/17/2011 ALK & EGFR negative   History of Present Illness:     Patient is an 79 year old female who underwent surgical resection of a 3.5 cm invasive well differentiated adenocarcinoma in the right upper lobe on 07/17/2011. She's made excellent progress postoperatively nearly returning to Near-normal activities. She does have a slight dry hacking cough occasionally no hemoptysis. With the significant exertion such as climbing stairs she does note that her pulmonary reserve is not what it used to be. She denies any definite anginal chest pain.    History  Smoking status  . Never Smoker   Smokeless tobacco  . Not on file       Allergies  Allergen Reactions  . Codeine   . Demerol     nausea  . Tramadol     syncope    Current Outpatient Prescriptions  Medication Sig Dispense Refill  . amitriptyline (ELAVIL) 10 MG tablet Take 1/2 tablet at bedtime for sleep  45 tablet  1  . bisoprolol-hydrochlorothiazide (ZIAC) 2.5-6.25 MG per tablet Take 1 tablet by mouth daily.  90 tablet  1  . clobetasol ointment (TEMOVATE) 0.05 % as directed.      . fluorouracil (EFUDEX) 5 % cream as directed.      [name] ibuprofen (ADVIL,MOTRIN) 200 MG tablet Take 200 mg by mouth every 6 (six) hours as needed.      [name] levothyroxine (SYNTHROID,  LEVOTHROID) 100 MCG tablet TAKE ONE TABLET BY MOUTH EVERY DAY  90 tablet  3  . lovastatin (MEVACOR) 20 MG tablet TAKE ONE TABLET BY MOUTH EVERY OTHER  DAY  45 tablet  2  . meclizine (ANTIVERT) 25 MG tablet Take 1 tablet (25 mg total) by mouth 3 (three) times daily as needed.  30 tablet  5  . metFORMIN (GLUCOPHAGE) 500 MG tablet Take 1 tablet (500 mg total) by mouth 2 (two) times daily with a meal.  180 tablet  3  . ramipril (ALTACE) 2.5 MG capsule Take 1 capsule (2.5 mg total) by mouth daily.  90 capsule  3   No current facility-administered medications for this visit.       Physical Exam: BP 149/76  Pulse 67  Resp 16  Ht 5\' 1"  (1.549 m)  Wt 128 lb (58.06 kg)  BMI 24.20 kg/m2  SpO2 98%  General appearance: alert and cooperative Neurologic: intact Neck: The right thyroid lobe is slightly more prominent than the left. Heart: regular rate and rhythm, S1, S2 normal, no murmur, click, rub or gallop and normal apical impulse Lungs: clear to auscultation bilaterally and normal percussion bilaterally Abdomen: soft, non-tender; bowel sounds normal; no masses,  no  organomegaly Extremities: extremities normal, atraumatic, no cyanosis or edema Wound: Her right chest wound and chest tube sites are well-healed I do not appreciate any cervical or supraclavicular adenopathy, she does have a probable thyroid particularly the right lobe.  Diagnostic Studies & Laboratory data:         Recent Radiology Findings: Ct Chest Wo Contrast  03/30/2013   CLINICAL DATA:  Status post right upper lobe lung resection for neoplastic disease surveillance evaluation.  EXAM: CT CHEST WITHOUT CONTRAST  TECHNIQUE: Multidetector CT imaging of the chest was performed following the standard protocol without IV contrast.  COMPARISON:  09/08/2012, 02/04/2012  FINDINGS: Evaluation of the thoracic inlet demonstrates a lobular mass which appears stable in the right lobe of the thyroid, again consistent with a thyroid goiter.  The thoracic inlet is otherwise unremarkable. The maximal diameter of the ascending aorta is also unchanged at 3.6 mm. Atherosclerotic calcifications identified within the aorta. There is no evidence of mediastinal or hilar adenopathy nor masses.  The lung parenchyma once again demonstrates postsurgical changes along the anterior peripheral base of the right lower lobe. Postsurgical changes, stable, are identified within the right hilar region. There is otherwise no evidence of pulmonary nodules, masses, infiltrates, effusions nor edema.  A stable calcified splenic cyst is again identified.  Remaining visualized upper abdominal viscera are unremarkable.  IMPRESSION: 1. Stable postop changes within the right hemi thorax consistent with a right upper lobe lobectomy 2. No evidence of mediastinal nor hilar adenopathy 3. Probable border within the thyroid 4. Stable diameter of the ascending aorta   Electronically Signed   By: Samantha Quinn M.D.   On: 03/30/2013 12:19      Recent Labs: Lab Results  Component Value Date   WBC 5.2 12/30/2011   WBC 5.1 12/30/2011   HGB 12.3 12/30/2011   HGB 12.3 12/30/2011   HCT 38.0 12/30/2011   HCT 37.5 12/30/2011   PLT 275.0 12/30/2011   PLT 273.0 12/30/2011   GLUCOSE 122* 02/01/2013   CHOL 150 02/01/2013   TRIG 100.0 02/01/2013   HDL 48.40 02/01/2013   LDLCALC 82 02/01/2013   ALT 15 02/01/2013   AST 18 02/01/2013   NA 142 02/01/2013   K 3.9 02/01/2013   CL 105 02/01/2013   CREATININE 0.6 02/01/2013   BUN 16 02/01/2013   CO2 26 02/01/2013   TSH 0.02* 09/29/2012   INR 0.96 07/16/2011   HGBA1C 6.5 09/29/2012      Assessment / Plan:      p T2a, pN0 M0 well-differentiated adenocarcinoma of the right upper lobe resected 07/17/2011, Followup CT scan now approximately one year following surgical resection with right upper lobectomy shows no evidence of recurrent carcinoma the lung Her descending aorta is very mildly dilated, 3.6 cm unchanged  The right lobe of the thyroid is slightly  enlarged consistent with goiter which she is being treated for. We'll plan to see her back in 6 months with a followup CT scan of the chest  Samantha Ovens MD 03/30/2013 12:23 PM

## 2013-05-08 ENCOUNTER — Telehealth: Payer: Self-pay | Admitting: Cardiology

## 2013-05-08 MED ORDER — AZITHROMYCIN 250 MG PO TABS: ORAL_TABLET | ORAL | Status: DC

## 2013-05-08 NOTE — Telephone Encounter (Signed)
New message  Patient feels that she is getting sick and would like an antibiotic called in. Please call and advise.

## 2013-05-08 NOTE — Telephone Encounter (Signed)
Called patient and she is having what she thinks is sinus drainage and deep cough, non productive. Temp this am 99.3. States she feels fine. Recommended she try plain Mucinex but would forward to  Dr. Mare Ferrari to see if any other recommendations.

## 2013-05-08 NOTE — Telephone Encounter (Signed)
Advised patient

## 2013-05-08 NOTE — Telephone Encounter (Signed)
Add Zpak

## 2013-05-23 ENCOUNTER — Other Ambulatory Visit: Payer: Self-pay

## 2013-05-23 MED ORDER — BISOPROLOL-HYDROCHLOROTHIAZIDE 2.5-6.25 MG PO TABS: 1.0000 | ORAL_TABLET | Freq: Every day | ORAL | Status: DC

## 2013-05-25 DIAGNOSIS — M5126 Other intervertebral disc displacement, lumbar region: Secondary | ICD-10-CM | POA: Diagnosis not present

## 2013-05-25 DIAGNOSIS — M259 Joint disorder, unspecified: Secondary | ICD-10-CM | POA: Diagnosis not present

## 2013-05-25 DIAGNOSIS — M76899 Other specified enthesopathies of unspecified lower limb, excluding foot: Secondary | ICD-10-CM | POA: Diagnosis not present

## 2013-06-07 ENCOUNTER — Ambulatory Visit: Payer: Medicare Other | Admitting: Cardiology

## 2013-06-15 ENCOUNTER — Other Ambulatory Visit: Payer: Self-pay | Admitting: Cardiology

## 2013-06-21 ENCOUNTER — Ambulatory Visit: Payer: Medicare Other | Admitting: Cardiology

## 2013-06-21 ENCOUNTER — Other Ambulatory Visit: Payer: Medicare Other

## 2013-06-30 ENCOUNTER — Other Ambulatory Visit: Payer: Self-pay | Admitting: Dermatology

## 2013-06-30 DIAGNOSIS — L570 Actinic keratosis: Secondary | ICD-10-CM | POA: Diagnosis not present

## 2013-06-30 DIAGNOSIS — D0460 Carcinoma in situ of skin of unspecified upper limb, including shoulder: Secondary | ICD-10-CM | POA: Diagnosis not present

## 2013-06-30 DIAGNOSIS — D0470 Carcinoma in situ of skin of unspecified lower limb, including hip: Secondary | ICD-10-CM | POA: Diagnosis not present

## 2013-06-30 DIAGNOSIS — B079 Viral wart, unspecified: Secondary | ICD-10-CM | POA: Diagnosis not present

## 2013-06-30 DIAGNOSIS — D485 Neoplasm of uncertain behavior of skin: Secondary | ICD-10-CM | POA: Diagnosis not present

## 2013-06-30 DIAGNOSIS — Z85828 Personal history of other malignant neoplasm of skin: Secondary | ICD-10-CM | POA: Diagnosis not present

## 2013-06-30 DIAGNOSIS — L259 Unspecified contact dermatitis, unspecified cause: Secondary | ICD-10-CM | POA: Diagnosis not present

## 2013-06-30 DIAGNOSIS — L821 Other seborrheic keratosis: Secondary | ICD-10-CM | POA: Diagnosis not present

## 2013-06-30 DIAGNOSIS — C44711 Basal cell carcinoma of skin of unspecified lower limb, including hip: Secondary | ICD-10-CM | POA: Diagnosis not present

## 2013-06-30 DIAGNOSIS — C44611 Basal cell carcinoma of skin of unspecified upper limb, including shoulder: Secondary | ICD-10-CM | POA: Diagnosis not present

## 2013-07-20 DIAGNOSIS — L570 Actinic keratosis: Secondary | ICD-10-CM | POA: Diagnosis not present

## 2013-07-20 DIAGNOSIS — Z85828 Personal history of other malignant neoplasm of skin: Secondary | ICD-10-CM | POA: Diagnosis not present

## 2013-08-09 DIAGNOSIS — Z85828 Personal history of other malignant neoplasm of skin: Secondary | ICD-10-CM | POA: Diagnosis not present

## 2013-08-09 DIAGNOSIS — L0390 Cellulitis, unspecified: Secondary | ICD-10-CM | POA: Diagnosis not present

## 2013-08-09 DIAGNOSIS — L0291 Cutaneous abscess, unspecified: Secondary | ICD-10-CM | POA: Diagnosis not present

## 2013-08-09 DIAGNOSIS — L570 Actinic keratosis: Secondary | ICD-10-CM | POA: Diagnosis not present

## 2013-08-16 DIAGNOSIS — L570 Actinic keratosis: Secondary | ICD-10-CM | POA: Diagnosis not present

## 2013-08-16 DIAGNOSIS — L0390 Cellulitis, unspecified: Secondary | ICD-10-CM | POA: Diagnosis not present

## 2013-08-16 DIAGNOSIS — Z85828 Personal history of other malignant neoplasm of skin: Secondary | ICD-10-CM | POA: Diagnosis not present

## 2013-08-16 DIAGNOSIS — L0291 Cutaneous abscess, unspecified: Secondary | ICD-10-CM | POA: Diagnosis not present

## 2013-08-18 ENCOUNTER — Ambulatory Visit (INDEPENDENT_AMBULATORY_CARE_PROVIDER_SITE_OTHER): Payer: Medicare Other | Admitting: Cardiology

## 2013-08-18 ENCOUNTER — Other Ambulatory Visit: Payer: Medicare Other

## 2013-08-18 ENCOUNTER — Encounter: Payer: Self-pay | Admitting: Cardiology

## 2013-08-18 VITALS — BP 140/66 | HR 56 | Ht 61.0 in | Wt 127.0 lb

## 2013-08-18 DIAGNOSIS — I119 Hypertensive heart disease without heart failure: Secondary | ICD-10-CM | POA: Diagnosis not present

## 2013-08-18 DIAGNOSIS — E042 Nontoxic multinodular goiter: Secondary | ICD-10-CM | POA: Diagnosis not present

## 2013-08-18 DIAGNOSIS — E7800 Pure hypercholesterolemia, unspecified: Secondary | ICD-10-CM | POA: Diagnosis not present

## 2013-08-18 DIAGNOSIS — E119 Type 2 diabetes mellitus without complications: Secondary | ICD-10-CM

## 2013-08-18 DIAGNOSIS — E039 Hypothyroidism, unspecified: Secondary | ICD-10-CM

## 2013-08-18 LAB — CBC WITH DIFFERENTIAL/PLATELET
Basophils Absolute: 0 10*3/uL (ref 0.0–0.1)
Basophils Relative: 0.5 % (ref 0.0–3.0)
EOS ABS: 0.1 10*3/uL (ref 0.0–0.7)
Eosinophils Relative: 0.9 % (ref 0.0–5.0)
HCT: 37.3 % (ref 36.0–46.0)
Hemoglobin: 12.5 g/dL (ref 12.0–15.0)
Lymphocytes Relative: 19.6 % (ref 12.0–46.0)
Lymphs Abs: 1.2 10*3/uL (ref 0.7–4.0)
MCHC: 33.4 g/dL (ref 30.0–36.0)
MCV: 84.5 fl (ref 78.0–100.0)
Monocytes Absolute: 0.4 10*3/uL (ref 0.1–1.0)
Monocytes Relative: 7.2 % (ref 3.0–12.0)
NEUTROS PCT: 71.8 % (ref 43.0–77.0)
Neutro Abs: 4.4 10*3/uL (ref 1.4–7.7)
Platelets: 294 10*3/uL (ref 150.0–400.0)
RBC: 4.42 Mil/uL (ref 3.87–5.11)
RDW: 14 % (ref 11.5–14.6)
WBC: 6.2 10*3/uL (ref 4.5–10.5)

## 2013-08-18 LAB — TSH: TSH: 0.01 u[IU]/mL — ABNORMAL LOW (ref 0.35–5.50)

## 2013-08-18 LAB — LIPID PANEL
CHOL/HDL RATIO: 3
Cholesterol: 146 mg/dL (ref 0–200)
HDL: 44.7 mg/dL (ref >39.00)
LDL Cholesterol: 78 mg/dL (ref 0–99)
Triglycerides: 119 mg/dL (ref 0.0–149.0)
VLDL: 23.8 mg/dL (ref 0.0–40.0)

## 2013-08-18 LAB — BASIC METABOLIC PANEL
BUN: 21 mg/dL (ref 6–23)
CALCIUM: 9.6 mg/dL (ref 8.4–10.5)
CO2: 27 meq/L (ref 19–32)
CREATININE: 0.6 mg/dL (ref 0.4–1.2)
Chloride: 102 mEq/L (ref 96–112)
GFR: 109.73 mL/min (ref >60.00)
GLUCOSE: 111 mg/dL — AB (ref 70–99)
Potassium: 3.7 mEq/L (ref 3.5–5.1)
Sodium: 136 mEq/L (ref 135–145)

## 2013-08-18 LAB — HEPATIC FUNCTION PANEL
ALT: 16 U/L (ref 0–35)
AST: 18 U/L (ref 0–37)
Albumin: 4 g/dL (ref 3.5–5.2)
Alkaline Phosphatase: 63 U/L (ref 39–117)
BILIRUBIN TOTAL: 0.7 mg/dL (ref 0.3–1.2)
Bilirubin, Direct: 0 mg/dL (ref 0.0–0.3)
Total Protein: 6.9 g/dL (ref 6.0–8.3)

## 2013-08-18 LAB — T4, FREE: Free T4: 1.42 ng/dL (ref 0.60–1.60)

## 2013-08-18 NOTE — Progress Notes (Signed)
Samantha Quinn Date of Birth:  January 09, 1928 [address] [place], [place]  [address] [phone]         Fax   [phone]  History of Present Illness: This pleasant 80 year old woman is seen for a followup office visit. The patient was hospitalized in March 2013 for a right lung mass and underwent surgery by Dr. Servando Snare for a adenocarcinoma of the right lung. She did very well postoperatively.  The patient has a past history of hypercholesterolemia and essential hypertension. She also has mild diabetes mellitus. She has hypothyroidism and a history of a multinodular goiter which has been known since 23. She is on Synthroid. She does not have any history of ischemic heart disease.   Current Outpatient Prescriptions  Medication Sig Dispense Refill  . amitriptyline (ELAVIL) 10 MG tablet Take 1/2 tablet at bedtime for sleep  45 tablet  1  . azithromycin (ZITHROMAX) 250 MG tablet 2 tablets today and then 1 daily until finished.  6 each  0  . bisoprolol-hydrochlorothiazide (ZIAC) 2.5-6.25 MG per tablet Take 1 tablet by mouth daily.  90 tablet  0  . clobetasol ointment (TEMOVATE) 0.05 % as directed.      . fluorouracil (EFUDEX) 5 % cream as directed.      [name] ibuprofen (ADVIL,MOTRIN) 200 MG tablet Take 200 mg by mouth every 6 (six) hours as needed.      [name] levothyroxine (SYNTHROID, LEVOTHROID) 100 MCG tablet TAKE ONE TABLET BY MOUTH EVERY DAY  90 tablet  3  . lovastatin (MEVACOR) 20 MG tablet TAKE ONE TABLET BY MOUTH EVERY OTHER DAY  45 tablet  0  . meclizine (ANTIVERT) 25 MG tablet Take 1 tablet (25 mg total) by mouth 3 (three) times daily as needed.  30 tablet  5  . metFORMIN (GLUCOPHAGE) 500 MG tablet Take 1 tablet (500 mg total) by mouth 2 (two) times daily with a meal.  180 tablet  3  . ramipril (ALTACE) 2.5 MG capsule Take 1 capsule (2.5 mg total) by mouth daily.  90 capsule  3   No current facility-administered medications for this visit.    Allergies  Allergen  Reactions  . Codeine   . Demerol     nausea  . Tramadol     syncope    Patient Active Problem List   Diagnosis Date Noted  . Multinodular goiter (nontoxic) 09/29/2012  . Anemia 09/09/2011  . Pneumothorax after biopsy 07/09/2011    Class: Acute  . Primary adenocarcinoma of lung 06/24/2011  . Abnormal chest x-ray 06/18/2011  . Benign hypertensive heart disease without heart failure 10/20/2010  . Hypercholesterolemia 10/20/2010  . Type II or unspecified type diabetes mellitus without mention of complication, uncontrolled 10/20/2010  . Hypothyroidism (acquired) 10/20/2010  . Osteoarthritis 10/20/2010    History  Smoking status  . Never Smoker   Smokeless tobacco  . Not on file    History  Alcohol Use No    Family History  Problem Relation Age of Onset  . Arthritis Sister   . Arthritis Sister   . Diabetes Brother   . Hypertension Mother   . Anesthesia problems Neg Hx   . Hypotension Neg Hx   . Malignant hyperthermia Neg Hx   . Pseudochol deficiency Neg Hx     Review of Systems: Constitutional: no fever chills diaphoresis or fatigue or change in weight.  Head and neck: no hearing loss, no epistaxis, no photophobia or visual disturbance. Respiratory: No cough,  shortness of breath or wheezing. Cardiovascular: No chest pain peripheral edema, palpitations. Gastrointestinal: No abdominal distention, no abdominal pain, no change in bowel habits hematochezia or melena. Genitourinary: No dysuria, no frequency, no urgency, no nocturia. Musculoskeletal:No arthralgias, no back pain, no gait disturbance or myalgias. Neurological: No dizziness, no headaches, no numbness, no seizures, no syncope, no weakness, no tremors. Hematologic: No lymphadenopathy, no easy bruising. Psychiatric: No confusion, no hallucinations, no sleep disturbance.    Physical Exam: Filed Vitals:   08/18/13 0909  BP: 140/66  Pulse: 56   the general appearance reveals a well-developed well-nourished  woman in no distress.The head and neck exam reveals pupils equal and reactive.  Extraocular movements are full.  There is no scleral icterus.  The mouth and pharynx are normal.  The neck is supple.  The carotids reveal no bruits.  The jugular venous pressure is normal.  The  thyroid is enlarged with a palpable multinodular goiter especially prominent on the right side.  There is no lymphadenopathy.  The chest is clear to percussion and auscultation.  There are no rales or rhonchi.  Expansion of the chest is symmetrical.  The precordium is quiet.  The first heart sound is normal.  The second heart sound is physiologically split.  There is no murmur gallop rub or click.  There is no abnormal lift or heave.  The abdomen is soft and nontender.  The bowel sounds are normal.  The liver and spleen are not enlarged.  There are no abdominal masses.  There are no abdominal bruits.  Extremities reveal good pedal pulses.  There is no phlebitis or edema.  There is no cyanosis or clubbing.  Strength is normal and symmetrical in all extremities.  There is no lateralizing weakness.  There are no sensory deficits.  The skin is warm and dry.  There is no rash.    Assessment / Plan: Patient is doing well.  She will continue same medication.  She'll return in 4 months for office visit CBC  lipid panel hepatic function panel and basal metabolic panel.  And hemoglobin A1c.. We are sending her to Dr. Ernesto Rutherford for evaluation of her goiter which is bothering her more now.

## 2013-08-18 NOTE — Assessment & Plan Note (Signed)
The patient is on lovastatin 20 mg daily.  Blood work today is pending.  She is not having any myalgias from the statin therapy.

## 2013-08-18 NOTE — Assessment & Plan Note (Signed)
Blood pressure is staying stable on current therapy.  No headaches dizziness or syncope.  No exertional chest pain.

## 2013-08-18 NOTE — Assessment & Plan Note (Signed)
The patient remains on Synthroid and we are checking TSH and free T4 today.

## 2013-08-18 NOTE — Assessment & Plan Note (Signed)
She was found to have a bilateral multinodular goiter in 1996 when she had an ultrasound. She had a repeat ultrasound in 2001 again demonstrating a bilateral multinodular goiter. She had a PET scan in 2013 for workup of her lung cancer and at that time there was no evidence of any hyperactivity in the thyroid gland.  She feels that her thyroid nodule in the right lower lobe is increasing in size.  She wonders if it may be affecting her swallowing and her voice at times.  We will refer her to Dr. Ernesto Rutherford for ENT evaluation.

## 2013-08-18 NOTE — Patient Instructions (Signed)
Will obtain labs today and call you with the results (lp/bmet/hfp/cbc/tsh/ft4)  Your physician recommends that you continue on your current medications as directed. Please refer to the Current Medication list given to you today.  Your physician wants you to follow-up in: 4 months with fasting labs (lp/bmet/hfp) You will receive a reminder letter in the mail two months in advance. If you don't receive a letter, please call our office to schedule the follow-up appointment.   YOU ARE SCHEDULED TO SEE DR CROSSLY ON Monday 4/27 AT 9:30. IF THAT TIME DOES NOT WORK FOR YOU THE PHONE NUMBER IS [phone] If you would like to register prior to appointment online the website is [email or web address]. If you do please arrive 5 minutes prior to appointment if not 15 minutes prior. You need to bring your medications, photo id, and your insurance cards.

## 2013-08-20 NOTE — Progress Notes (Signed)
Quick Note:  Please report to patient. The recent labs are stable. Continue same medication and careful diet. ______ 

## 2013-08-22 ENCOUNTER — Telehealth: Payer: Self-pay | Admitting: Cardiology

## 2013-08-22 NOTE — Telephone Encounter (Signed)
Advised patient of lab results  

## 2013-08-22 NOTE — Telephone Encounter (Signed)
Follow Up  ° °Pt returned call for results °

## 2013-08-22 NOTE — Telephone Encounter (Signed)
Message copied by Earvin Hansen on Tue Aug 22, 2013  3:32 PM ------      Message from: [name]      Created: Sun Aug 20, 2013  8:00 PM       Please report to patient.  The recent labs are stable. Continue same medication and careful diet. ------

## 2013-08-23 ENCOUNTER — Other Ambulatory Visit: Payer: Self-pay | Admitting: *Deleted

## 2013-08-23 DIAGNOSIS — C3490 Malignant neoplasm of unspecified part of unspecified bronchus or lung: Secondary | ICD-10-CM

## 2013-08-24 ENCOUNTER — Other Ambulatory Visit (HOSPITAL_COMMUNITY): Payer: Self-pay | Admitting: Otolaryngology

## 2013-08-24 DIAGNOSIS — E042 Nontoxic multinodular goiter: Secondary | ICD-10-CM | POA: Diagnosis not present

## 2013-08-24 DIAGNOSIS — E049 Nontoxic goiter, unspecified: Secondary | ICD-10-CM

## 2013-08-28 ENCOUNTER — Other Ambulatory Visit: Payer: Self-pay | Admitting: Cardiology

## 2013-08-29 ENCOUNTER — Ambulatory Visit (HOSPITAL_COMMUNITY)
Admission: RE | Admit: 2013-08-29 | Discharge: 2013-08-29 | Disposition: A | Discharge status: Home / Self Care | Payer: Medicare Other | Source: Ambulatory Visit | Attending: Otolaryngology | Admitting: Otolaryngology

## 2013-08-29 DIAGNOSIS — E049 Nontoxic goiter, unspecified: Secondary | ICD-10-CM | POA: Diagnosis not present

## 2013-08-29 DIAGNOSIS — E041 Nontoxic single thyroid nodule: Secondary | ICD-10-CM | POA: Diagnosis not present

## 2013-08-30 ENCOUNTER — Other Ambulatory Visit (HOSPITAL_COMMUNITY): Payer: Self-pay | Admitting: Otolaryngology

## 2013-08-30 DIAGNOSIS — E041 Nontoxic single thyroid nodule: Secondary | ICD-10-CM

## 2013-08-31 ENCOUNTER — Other Ambulatory Visit: Payer: Self-pay | Admitting: Radiology

## 2013-08-31 ENCOUNTER — Telehealth: Payer: Self-pay | Admitting: Cardiology

## 2013-08-31 NOTE — Telephone Encounter (Signed)
Advised patient

## 2013-08-31 NOTE — Telephone Encounter (Signed)
Discussed with  Dr. Mare Ferrari and he agrees with biopsy

## 2013-08-31 NOTE — Telephone Encounter (Signed)
Patient scheduled for thyroid nodule biopsy Thursday of next week with Dr Ernesto Rutherford. She wants to make sure  Dr. Mare Ferrari agrees with this. Will forward to him for review

## 2013-08-31 NOTE — Telephone Encounter (Signed)
New message     Talk to [facility] regarding a referral

## 2013-09-01 ENCOUNTER — Encounter (HOSPITAL_COMMUNITY): Payer: Self-pay | Admitting: Pharmacy Technician

## 2013-09-07 ENCOUNTER — Ambulatory Visit (HOSPITAL_COMMUNITY)
Admission: RE | Admit: 2013-09-07 | Discharge: 2013-09-07 | Disposition: A | Discharge status: Home / Self Care | Payer: Medicare Other | Source: Ambulatory Visit | Attending: Otolaryngology | Admitting: Otolaryngology

## 2013-09-07 DIAGNOSIS — E041 Nontoxic single thyroid nodule: Secondary | ICD-10-CM

## 2013-09-07 DIAGNOSIS — E042 Nontoxic multinodular goiter: Secondary | ICD-10-CM | POA: Insufficient documentation

## 2013-09-07 DIAGNOSIS — E049 Nontoxic goiter, unspecified: Secondary | ICD-10-CM | POA: Diagnosis not present

## 2013-09-07 DIAGNOSIS — D34 Benign neoplasm of thyroid gland: Secondary | ICD-10-CM | POA: Diagnosis not present

## 2013-09-07 NOTE — Procedures (Signed)
US guided FNA X4 via 25 gauge needles dominant rt mid pole solid/cystic thyroid nodule. Path pending. No immediate complications.

## 2013-09-11 DIAGNOSIS — E042 Nontoxic multinodular goiter: Secondary | ICD-10-CM | POA: Diagnosis not present

## 2013-09-13 DIAGNOSIS — L570 Actinic keratosis: Secondary | ICD-10-CM | POA: Diagnosis not present

## 2013-09-13 DIAGNOSIS — L98499 Non-pressure chronic ulcer of skin of other sites with unspecified severity: Secondary | ICD-10-CM | POA: Diagnosis not present

## 2013-09-13 DIAGNOSIS — Z85828 Personal history of other malignant neoplasm of skin: Secondary | ICD-10-CM | POA: Diagnosis not present

## 2013-10-12 ENCOUNTER — Ambulatory Visit (INDEPENDENT_AMBULATORY_CARE_PROVIDER_SITE_OTHER): Payer: Medicare Other | Admitting: Cardiothoracic Surgery

## 2013-10-12 ENCOUNTER — Ambulatory Visit
Admission: RE | Admit: 2013-10-12 | Discharge: 2013-10-12 | Disposition: A | Discharge status: Home / Self Care | Payer: Medicare Other | Source: Ambulatory Visit | Attending: Cardiothoracic Surgery | Admitting: Cardiothoracic Surgery

## 2013-10-12 ENCOUNTER — Encounter: Payer: Self-pay | Admitting: Cardiothoracic Surgery

## 2013-10-12 VITALS — BP 123/66 | HR 72 | Resp 20 | Ht 61.0 in | Wt 127.0 lb

## 2013-10-12 DIAGNOSIS — C3490 Malignant neoplasm of unspecified part of unspecified bronchus or lung: Secondary | ICD-10-CM

## 2013-10-12 DIAGNOSIS — J984 Other disorders of lung: Secondary | ICD-10-CM | POA: Diagnosis not present

## 2013-10-12 DIAGNOSIS — Z9889 Other specified postprocedural states: Secondary | ICD-10-CM | POA: Diagnosis not present

## 2013-10-12 DIAGNOSIS — Z902 Acquired absence of lung [part of]: Secondary | ICD-10-CM

## 2013-10-12 DIAGNOSIS — Z85118 Personal history of other malignant neoplasm of bronchus and lung: Secondary | ICD-10-CM | POA: Diagnosis not present

## 2013-10-12 NOTE — Progress Notes (Signed)
[address]       [place] [address]             [phone]                       Margia M [facility] Medical Record #[id number] Date of Birth: 09-08-1927  [name], MD  Chief Complaint:   PostOp Follow Up Visit 07/17/2011  OPERATIVE REPORT  PREOPERATIVE DIAGNOSIS: Non-small-cell lung cancer, right upper lobe.  POSTOPERATIVE DIAGNOSIS: Non-small-cell lung cancer, right upper lobe.  SURGICAL PROCEDURES: Bronchoscopy, right video-assisted thoracoscopy,  right upper lobectomy with node dissection, and placement of On-Q  device.   p T2a, pN0 M0  3.5 cmwell-differentiated adenocarcinoma of the right upper lobe resected 07/17/2011 ALK & EGFR negative   History of Present Illness:     Patient is an 80 year old female who underwent surgical resection of a 3.5 cm invasive well differentiated adenocarcinoma in the right upper lobe on 07/17/2011. She's made excellent progress postoperatively nearly returning to Near-normal activities. She does have a slight dry hacking cough occasionally no hemoptysis. With the significant exertion such as climbing stairs she does note that her pulmonary reserve is not what it used to be. She denies any definite anginal chest pain. She denies any hemoptysis or recurrent pulmonary infections   Since last seen the patient's had needle biopsy of her right neck mass, benign goiter. Overall she's been doing well, she does note that she fatigues a little faster than she did and is currently considering moving back to [place] to be closer to her children and to move to a smaller house.    History  Smoking status  . Never Smoker   Smokeless tobacco  . Not on file       Allergies  Allergen Reactions  . Codeine Nausea And Vomiting  . Demerol     nausea  . Tramadol     syncope    Current Outpatient Prescriptions  Medication Sig Dispense Refill  . amitriptyline (ELAVIL) 10 MG tablet Take 5 mg by  mouth at bedtime. Take 1/2 tablet at bedtime for sleep      . bisoprolol-hydrochlorothiazide (ZIAC) 2.5-6.25 MG per tablet Take 1 tablet by mouth daily.      . clobetasol ointment (TEMOVATE) 8.41 % Apply 1 application topically daily as needed (for rash).       . fluorouracil (EFUDEX) 5 % cream Apply 1 application topically daily as needed (for pre cancerous lesions).       [name] ibuprofen (ADVIL,MOTRIN) 200 MG tablet Take 200 mg by mouth every 6 (six) hours as needed for fever, headache or mild pain.       [name] levothyroxine (SYNTHROID, LEVOTHROID) 100 MCG tablet Take 100 mcg by mouth every other day.      . lovastatin (MEVACOR) 20 MG tablet Take 20 mg by mouth at bedtime.      . meclizine (ANTIVERT) 25 MG tablet Take 25 mg by mouth 3 (three) times daily as needed for dizziness.      . metFORMIN (GLUCOPHAGE) 500 MG tablet Take 1 tablet (500 mg total) by mouth 2 (two) times daily with a meal.  180 tablet  3  . ramipril (ALTACE) 2.5 MG capsule Take 1 capsule (2.5 mg total) by mouth daily.  90 capsule  3   No current facility-administered medications for this visit.  Physical Exam: BP 123/66  Pulse 72  Resp 20  Ht '5\' 1"'  (1.549 m)  Wt 127 lb (57.607 kg)  BMI 24.01 kg/m2  SpO2 98%  General appearance: alert and cooperative Neurologic: intact Neck: The right thyroid lobe is slightly more prominent than the left. Heart: regular rate and rhythm, S1, S2 normal, no murmur, click, rub or gallop and normal apical impulse Lungs: clear to auscultation bilaterally and normal percussion bilaterally Abdomen: soft, non-tender; bowel sounds normal; no masses,  no organomegaly Extremities: extremities normal, atraumatic, no cyanosis or edema Wound: Her right chest wound and chest tube sites are well-healed I do not appreciate any cervical or supraclavicular adenopathy, she does have a probable thyroid particularly the right lobe.  Diagnostic Studies & Laboratory data:         Recent Radiology  Findings: Ct Chest Wo Contrast  10/12/2013   CLINICAL DATA:  History of lung cancer status post right upper lobectomy in 2013. Followup study.  EXAM: CT CHEST WITHOUT CONTRAST  TECHNIQUE: Multidetector CT imaging of the chest was performed following the standard protocol without IV contrast.  COMPARISON:  Chest CT 03/30/2013.  FINDINGS: Mediastinum: Heart size is normal. There is no significant pericardial fluid, thickening or pericardial calcification. Slight non aneurysmal prominence of the ascending thoracic aorta (3.7 cm in diameter) is incidentally noted, and is similar to prior studies. No pathologically enlarged mediastinal or hilar lymph nodes. Please note that accurate exclusion of hilar adenopathy is limited on noncontrast CT scans. Esophagus is unremarkable in appearance.  Lungs/Pleura: Status post right upper lobectomy. Small amount of scarring in the periphery of the right lower lobe is unchanged compared to prior examinations. 3 mm left lower lobe nodule (image 35 of series 4) is unchanged compared to prior study 06/13/2011 and can be considered benign requiring no imaging followup. No other suspicious appearing pulmonary nodules or masses are noted. No acute consolidative airspace disease. No pleural effusions.  Upper Abdomen: Large rim calcified low-attenuation lesion in the spleen measuring 5.8 cm in diameter is similar to prior studies, presumably a posttraumatic cyst. Status post cholecystectomy.  Musculoskeletal: There are no aggressive appearing lytic or blastic lesions noted in the visualized portions of the skeleton. Soft tissue anchors in the right humeral head, presumably related to prior rotator cuff repair.  IMPRESSION: 1. Status post right upper lobectomy. No findings to suggest local recurrence of disease or metastatic disease in the thorax. 2. Incidental findings, as above, similar prior studies.   Electronically Signed   By: Vinnie Langton M.D.   On: 10/12/2013 12:34   Ct Chest  Wo Contrast  03/30/2013   CLINICAL DATA:  Status post right upper lobe lung resection for neoplastic disease surveillance evaluation.  EXAM: CT CHEST WITHOUT CONTRAST  TECHNIQUE: Multidetector CT imaging of the chest was performed following the standard protocol without IV contrast.  COMPARISON:  09/08/2012, 02/04/2012  FINDINGS: Evaluation of the thoracic inlet demonstrates a lobular mass which appears stable in the right lobe of the thyroid, again consistent with a thyroid goiter. The thoracic inlet is otherwise unremarkable. The maximal diameter of the ascending aorta is also unchanged at 3.6 mm. Atherosclerotic calcifications identified within the aorta. There is no evidence of mediastinal or hilar adenopathy nor masses.  The lung parenchyma once again demonstrates postsurgical changes along the anterior peripheral base of the right lower lobe. Postsurgical changes, stable, are identified within the right hilar region. There is otherwise no evidence of pulmonary nodules, masses, infiltrates, effusions nor edema.  A stable calcified splenic cyst is again identified.  Remaining visualized upper abdominal viscera are unremarkable.  IMPRESSION: 1. Stable postop changes within the right hemi thorax consistent with a right upper lobe lobectomy 2. No evidence of mediastinal nor hilar adenopathy 3. Probable border within the thyroid 4. Stable diameter of the ascending aorta   Electronically Signed   By: Margaree Mackintosh M.D.   On: 03/30/2013 12:19      Recent Labs: Lab Results  Component Value Date   WBC 6.2 08/18/2013   HGB 12.5 08/18/2013   HCT 37.3 08/18/2013   PLT 294.0 08/18/2013   GLUCOSE 111* 08/18/2013   CHOL 146 08/18/2013   TRIG 119.0 08/18/2013   HDL 44.70 08/18/2013   LDLCALC 78 08/18/2013   ALT 16 08/18/2013   AST 18 08/18/2013   NA 136 08/18/2013   K 3.7 08/18/2013   CL 102 08/18/2013   CREATININE 0.6 08/18/2013   BUN 21 08/18/2013   CO2 27 08/18/2013   TSH 0.01* 08/18/2013   INR 0.96 07/16/2011    HGBA1C 6.5 09/29/2012      Assessment / Plan:      p T2a, pN0 M0 well-differentiated adenocarcinoma of the right upper lobe resected 07/17/2011, without evidence of recurrence a serial CT scan Her descending aorta is very mildly dilated, 3.6 cm unchanged  The right lobe of the thyroid is slightly enlarged consistent with goiter which she is being treated for./ Biopsy negative We'll plan to see her back in 12 months with a followup CT scan of the chest  Grace Isaac MD 10/12/2013 2:06 PM

## 2013-10-17 ENCOUNTER — Other Ambulatory Visit: Payer: Self-pay | Admitting: Cardiology

## 2013-10-17 DIAGNOSIS — G4700 Insomnia, unspecified: Secondary | ICD-10-CM

## 2013-10-18 DIAGNOSIS — L708 Other acne: Secondary | ICD-10-CM | POA: Diagnosis not present

## 2013-10-18 DIAGNOSIS — L821 Other seborrheic keratosis: Secondary | ICD-10-CM | POA: Diagnosis not present

## 2013-10-18 DIAGNOSIS — L570 Actinic keratosis: Secondary | ICD-10-CM | POA: Diagnosis not present

## 2013-10-18 DIAGNOSIS — Z85828 Personal history of other malignant neoplasm of skin: Secondary | ICD-10-CM | POA: Diagnosis not present

## 2013-10-18 DIAGNOSIS — L98499 Non-pressure chronic ulcer of skin of other sites with unspecified severity: Secondary | ICD-10-CM | POA: Diagnosis not present

## 2013-11-03 ENCOUNTER — Other Ambulatory Visit: Payer: Self-pay | Admitting: Cardiology

## 2013-11-16 ENCOUNTER — Other Ambulatory Visit: Payer: Self-pay | Admitting: Cardiology

## 2013-12-06 DIAGNOSIS — H2510 Age-related nuclear cataract, unspecified eye: Secondary | ICD-10-CM | POA: Diagnosis not present

## 2013-12-06 DIAGNOSIS — H25019 Cortical age-related cataract, unspecified eye: Secondary | ICD-10-CM | POA: Diagnosis not present

## 2013-12-06 DIAGNOSIS — H353190 Nonexudative age-related macular degeneration, unspecified eye, stage unspecified: Secondary | ICD-10-CM | POA: Diagnosis not present

## 2013-12-06 DIAGNOSIS — E119 Type 2 diabetes mellitus without complications: Secondary | ICD-10-CM | POA: Diagnosis not present

## 2013-12-12 ENCOUNTER — Encounter: Payer: Self-pay | Admitting: Cardiology

## 2013-12-12 ENCOUNTER — Other Ambulatory Visit: Payer: Medicare Other

## 2013-12-12 ENCOUNTER — Ambulatory Visit (INDEPENDENT_AMBULATORY_CARE_PROVIDER_SITE_OTHER): Payer: Medicare Other | Admitting: Cardiology

## 2013-12-12 VITALS — BP 129/64 | HR 72 | Ht 61.0 in | Wt 127.0 lb

## 2013-12-12 DIAGNOSIS — E7800 Pure hypercholesterolemia, unspecified: Secondary | ICD-10-CM | POA: Diagnosis not present

## 2013-12-12 DIAGNOSIS — R05 Cough: Secondary | ICD-10-CM

## 2013-12-12 DIAGNOSIS — E119 Type 2 diabetes mellitus without complications: Secondary | ICD-10-CM

## 2013-12-12 DIAGNOSIS — E042 Nontoxic multinodular goiter: Secondary | ICD-10-CM

## 2013-12-12 DIAGNOSIS — R059 Cough, unspecified: Secondary | ICD-10-CM | POA: Insufficient documentation

## 2013-12-12 DIAGNOSIS — I119 Hypertensive heart disease without heart failure: Secondary | ICD-10-CM | POA: Diagnosis not present

## 2013-12-12 LAB — HEPATIC FUNCTION PANEL
ALT: 16 U/L (ref 0–35)
AST: 20 U/L (ref 0–37)
Albumin: 3.9 g/dL (ref 3.5–5.2)
Alkaline Phosphatase: 65 U/L (ref 39–117)
BILIRUBIN DIRECT: 0.1 mg/dL (ref 0.0–0.3)
BILIRUBIN TOTAL: 0.7 mg/dL (ref 0.2–1.2)
Total Protein: 7.2 g/dL (ref 6.0–8.3)

## 2013-12-12 LAB — CBC WITH DIFFERENTIAL/PLATELET
BASOS ABS: 0 10*3/uL (ref 0.0–0.1)
Basophils Relative: 0.4 % (ref 0.0–3.0)
Eosinophils Absolute: 0 10*3/uL (ref 0.0–0.7)
Eosinophils Relative: 0.7 % (ref 0.0–5.0)
HCT: 38.5 % (ref 36.0–46.0)
Hemoglobin: 12.9 g/dL (ref 12.0–15.0)
LYMPHS ABS: 1.2 10*3/uL (ref 0.7–4.0)
Lymphocytes Relative: 21.1 % (ref 12.0–46.0)
MCHC: 33.5 g/dL (ref 30.0–36.0)
MCV: 84.4 fl (ref 78.0–100.0)
MONOS PCT: 7.2 % (ref 3.0–12.0)
Monocytes Absolute: 0.4 10*3/uL (ref 0.1–1.0)
NEUTROS PCT: 70.6 % (ref 43.0–77.0)
Neutro Abs: 4.1 10*3/uL (ref 1.4–7.7)
PLATELETS: 295 10*3/uL (ref 150.0–400.0)
RBC: 4.56 Mil/uL (ref 3.87–5.11)
RDW: 14.3 % (ref 11.5–15.5)
WBC: 5.7 10*3/uL (ref 4.0–10.5)

## 2013-12-12 LAB — BASIC METABOLIC PANEL
BUN: 16 mg/dL (ref 6–23)
CO2: 27 mEq/L (ref 19–32)
CREATININE: 0.6 mg/dL (ref 0.4–1.2)
Calcium: 9.3 mg/dL (ref 8.4–10.5)
Chloride: 103 mEq/L (ref 96–112)
GFR: 95.71 mL/min (ref >60.00)
GLUCOSE: 107 mg/dL — AB (ref 70–99)
POTASSIUM: 4 meq/L (ref 3.5–5.1)
Sodium: 140 mEq/L (ref 135–145)

## 2013-12-12 LAB — LIPID PANEL
CHOLESTEROL: 154 mg/dL (ref 0–200)
HDL: 50.9 mg/dL (ref >39.00)
LDL Cholesterol: 80 mg/dL (ref 0–99)
NonHDL: 103.1
TRIGLYCERIDES: 116 mg/dL (ref 0.0–149.0)
Total CHOL/HDL Ratio: 3
VLDL: 23.2 mg/dL (ref 0.0–40.0)

## 2013-12-12 LAB — HEMOGLOBIN A1C: Hgb A1c MFr Bld: 6.5 % (ref 4.6–6.5)

## 2013-12-12 MED ORDER — METFORMIN HCL 500 MG PO TABS: 500.0000 mg | ORAL_TABLET | Freq: Two times a day (BID) | ORAL | Status: DC

## 2013-12-12 NOTE — Assessment & Plan Note (Signed)
The patient is on lovastatin for hypercholesterolemia.  Lab work is pending.  She denies any myalgias.

## 2013-12-12 NOTE — Assessment & Plan Note (Signed)
She is clinically euthyroid on current dose of synthroid.  We will plan to check a TSH at her next visit.  Her recent thyroid nodule biopsy was benign.

## 2013-12-12 NOTE — Progress Notes (Signed)
Quick Note:  Please report to patient. The recent labs are stable. Continue same medication and careful diet. The A1C is stable at 6.5. ______

## 2013-12-12 NOTE — Patient Instructions (Addendum)
Your physician recommends that you return for lab work in: TODAY; BMET, CBC W/DIFF, FASTING LIPID AND LIVER PANEL, A1C  Your physician recommends that you schedule a follow-up appointment in: [place]; AT THAT FOLLOW UP YOU WILL NEED FASTING LIPID AND LIVER PANEL, BMET, TSH.  Your physician recommends that you return for lab work in: [place], BMET, TSH

## 2013-12-12 NOTE — Assessment & Plan Note (Signed)
The patient has had a cough.  She thinks is from allergies and has started to take [place].  If it fails to improve we need to keep in mind that she is on an ACE inhibitor.

## 2013-12-12 NOTE — Progress Notes (Signed)
Samantha Quinn Date of Birth:  [birth year] Samantha Quinn [address] [place] Samantha Quinn, Samantha Quinn  [address] [phone]        Fax   [phone]   History of Present Illness: This pleasant 80 year old woman is seen for a followup office visit. The patient was hospitalized in March 2013 for a right lung mass and underwent surgery by Dr. Servando Snare for a adenocarcinoma of the right lung. She did very well postoperatively.  The patient has a past history of hypercholesterolemia and essential hypertension. She also has mild diabetes mellitus. She has hypothyroidism and a history of a multinodular goiter which has been known since 70. She is on Synthroid.  She had a needle biopsy of her thyroid nodule on 09/07/13 and the histology was benign.  She has seen Dr. Ernesto Rutherford for her goiter. She does not have any history of ischemic heart disease.   Current Outpatient Prescriptions  Medication Sig Dispense Refill  . amitriptyline (ELAVIL) 10 MG tablet TAKE ONE-HALF TABLET BY MOUTH AT BEDTIME FOR SLEEP  45 tablet  1  . bisoprolol-hydrochlorothiazide (ZIAC) 2.5-6.25 MG per tablet Take 1 tablet by mouth daily.      . clobetasol ointment (TEMOVATE) 6.59 % Apply 1 application topically daily as needed (for rash).       [name] ibuprofen (ADVIL,MOTRIN) 200 MG tablet Take 200 mg by mouth every 6 (six) hours as needed for fever, headache or mild pain.       [name] levothyroxine (SYNTHROID, LEVOTHROID) 100 MCG tablet Take 100 mcg by mouth every other day.      . lovastatin (MEVACOR) 20 MG tablet Take 20 mg by mouth at bedtime.      . meclizine (ANTIVERT) 25 MG tablet Take 25 mg by mouth 3 (three) times daily as needed for dizziness.      . metFORMIN (GLUCOPHAGE) 500 MG tablet Take 1 tablet (500 mg total) by mouth 2 (two) times daily with a meal.  180 tablet  3  . ramipril (ALTACE) 2.5 MG capsule TAKE ONE CAPSULE BY MOUTH ONCE DAILY  90 capsule  0   No current facility-administered medications for this visit.      Allergies  Allergen Reactions  . Codeine Nausea And Vomiting  . Demerol     nausea  . Tramadol     syncope    Patient Active Problem List   Diagnosis Date Noted  . Multinodular goiter (nontoxic) 09/29/2012  . Anemia 09/09/2011  . Pneumothorax after biopsy 07/09/2011    Class: Acute  . Primary adenocarcinoma of lung 06/24/2011  . Abnormal chest x-ray 06/18/2011  . Benign hypertensive heart disease without heart failure 10/20/2010  . Hypercholesterolemia 10/20/2010  . Type II or unspecified type diabetes mellitus without mention of complication, uncontrolled 10/20/2010  . Hypothyroidism (acquired) 10/20/2010  . Osteoarthritis 10/20/2010    History  Smoking status  . Never Smoker   Smokeless tobacco  . Not on file    History  Alcohol Use No    Family History  Problem Relation Age of Onset  . Arthritis Sister   . Arthritis Sister   . Diabetes Brother   . Hypertension Mother   . Anesthesia problems Neg Hx   . Hypotension Neg Hx   . Malignant hyperthermia Neg Hx   . Pseudochol deficiency Neg Hx   . Heart attack Neg Hx   . Hypertension Father   . Hypertension Sister     Review of Systems: Constitutional: no fever chills  diaphoresis or fatigue or change in weight.  Head and neck: no hearing loss, no epistaxis, no photophobia or visual disturbance. Respiratory: No cough, shortness of breath or wheezing. Cardiovascular: No chest pain peripheral edema, palpitations. Gastrointestinal: No abdominal distention, no abdominal pain, no change in bowel habits hematochezia or melena. Genitourinary: No dysuria, no frequency, no urgency, no nocturia. Musculoskeletal:No arthralgias, no back pain, no gait disturbance or myalgias. Neurological: No dizziness, no headaches, no numbness, no seizures, no syncope, no weakness, no tremors. Hematologic: No lymphadenopathy, no easy bruising. Psychiatric: No confusion, no hallucinations, no sleep disturbance.    Physical  Exam: Filed Vitals:   12/12/13 1040  BP: 129/64  Pulse: 72   the general appearance reveals a well-developed well-nourished elderly woman in no distress.The head and neck exam reveals pupils equal and reactive.  Extraocular movements are full.  There is no scleral icterus.  The mouth and pharynx are normal.  The neck is supple.  The carotids reveal no bruits.  The jugular venous pressure is normal.  The  thyroid is not enlarged.  There is no lymphadenopathy.  The chest is clear to percussion and auscultation.  There are no rales or rhonchi.  Expansion of the chest is symmetrical.  The precordium is quiet.  The first heart sound is normal.  The second heart sound is physiologically split.  There is no murmur gallop rub or click.  There is no abnormal lift or heave.  The abdomen is soft and nontender.  The bowel sounds are normal.  The liver and spleen are not enlarged.  There are no abdominal masses.  There are no abdominal bruits.  Extremities reveal good pedal pulses.  There is no phlebitis or edema.  There is no cyanosis or clubbing.  Strength is normal and symmetrical in all extremities.  There is no lateralizing weakness.  There are no sensory deficits.  The skin is warm and dry.  There is no rash.     Assessment / Plan: 1. essential hypertension without heart failure 2. Hypercholesterolemia 3. nontoxic goiter 4. past history of adenocarcinoma of right lung status post surgery in March 2013, no evidence of recurrence. 5. adult onset diabetes mellitus 6. osteoarthritis  Disposition: Continue same medication.  Recheck in 4 months for office visit EKG lipid panel hepatic function panel basal metabolic panel and TSH

## 2013-12-12 NOTE — Assessment & Plan Note (Signed)
Blood pressure was remaining stable on current therapy.  No orthopnea or paroxysmal nocturnal dyspnea.  She does have some exertional dyspnea.

## 2013-12-14 ENCOUNTER — Telehealth: Payer: Self-pay | Admitting: *Deleted

## 2013-12-14 NOTE — Telephone Encounter (Signed)
pt notified about lab results with verbal understanding and recommendations.

## 2013-12-18 ENCOUNTER — Other Ambulatory Visit: Payer: Medicare Other

## 2013-12-18 ENCOUNTER — Ambulatory Visit: Payer: Medicare Other | Admitting: Cardiology

## 2014-01-25 DIAGNOSIS — M7061 Trochanteric bursitis, right hip: Secondary | ICD-10-CM | POA: Diagnosis not present

## 2014-01-25 DIAGNOSIS — M5136 Other intervertebral disc degeneration, lumbar region: Secondary | ICD-10-CM | POA: Diagnosis not present

## 2014-01-25 DIAGNOSIS — Z23 Encounter for immunization: Secondary | ICD-10-CM | POA: Diagnosis not present

## 2014-01-25 DIAGNOSIS — M5126 Other intervertebral disc displacement, lumbar region: Secondary | ICD-10-CM | POA: Diagnosis not present

## 2014-02-05 ENCOUNTER — Other Ambulatory Visit: Payer: Self-pay | Admitting: Cardiology

## 2014-02-09 ENCOUNTER — Other Ambulatory Visit: Payer: Self-pay | Admitting: *Deleted

## 2014-02-09 MED ORDER — RAMIPRIL 2.5 MG PO CAPS: ORAL_CAPSULE | ORAL | Status: DC

## 2014-02-21 DIAGNOSIS — L570 Actinic keratosis: Secondary | ICD-10-CM | POA: Diagnosis not present

## 2014-02-21 DIAGNOSIS — Z85828 Personal history of other malignant neoplasm of skin: Secondary | ICD-10-CM | POA: Diagnosis not present

## 2014-02-21 DIAGNOSIS — L821 Other seborrheic keratosis: Secondary | ICD-10-CM | POA: Diagnosis not present

## 2014-03-10 ENCOUNTER — Other Ambulatory Visit: Payer: Self-pay | Admitting: Cardiology

## 2014-03-29 ENCOUNTER — Telehealth: Payer: Self-pay | Admitting: *Deleted

## 2014-03-29 ENCOUNTER — Other Ambulatory Visit: Payer: Self-pay | Admitting: *Deleted

## 2014-03-29 MED ORDER — LOVASTATIN 20 MG PO TABS
20.0000 mg | ORAL_TABLET | Freq: Every day | ORAL | Status: DC
Start: 2014-03-29 — End: 2014-10-01

## 2014-03-29 NOTE — Telephone Encounter (Signed)
New Message  Pt received flu shot from [facility] at the end of September of this year.

## 2014-04-11 ENCOUNTER — Other Ambulatory Visit (INDEPENDENT_AMBULATORY_CARE_PROVIDER_SITE_OTHER): Payer: Medicare Other | Admitting: *Deleted

## 2014-04-11 ENCOUNTER — Encounter: Payer: Self-pay | Admitting: Cardiology

## 2014-04-11 ENCOUNTER — Ambulatory Visit (INDEPENDENT_AMBULATORY_CARE_PROVIDER_SITE_OTHER): Payer: Medicare Other | Admitting: Cardiology

## 2014-04-11 VITALS — BP 142/78 | HR 67 | Ht 61.5 in | Wt 129.8 lb

## 2014-04-11 DIAGNOSIS — I119 Hypertensive heart disease without heart failure: Secondary | ICD-10-CM

## 2014-04-11 DIAGNOSIS — E7800 Pure hypercholesterolemia, unspecified: Secondary | ICD-10-CM

## 2014-04-11 DIAGNOSIS — M159 Polyosteoarthritis, unspecified: Secondary | ICD-10-CM

## 2014-04-11 DIAGNOSIS — E042 Nontoxic multinodular goiter: Secondary | ICD-10-CM

## 2014-04-11 DIAGNOSIS — E039 Hypothyroidism, unspecified: Secondary | ICD-10-CM | POA: Diagnosis not present

## 2014-04-11 DIAGNOSIS — M150 Primary generalized (osteo)arthritis: Secondary | ICD-10-CM | POA: Diagnosis not present

## 2014-04-11 LAB — BASIC METABOLIC PANEL
BUN: 15 mg/dL (ref 6–23)
CHLORIDE: 107 meq/L (ref 96–112)
CO2: 25 meq/L (ref 19–32)
Calcium: 9.1 mg/dL (ref 8.4–10.5)
Creatinine, Ser: 0.5 mg/dL (ref 0.4–1.2)
GFR: 114.25 mL/min (ref >60.00)
GLUCOSE: 117 mg/dL — AB (ref 70–99)
POTASSIUM: 4.2 meq/L (ref 3.5–5.1)
SODIUM: 138 meq/L (ref 135–145)

## 2014-04-11 LAB — LIPID PANEL
CHOL/HDL RATIO: 3
Cholesterol: 162 mg/dL (ref 0–200)
HDL: 48.8 mg/dL (ref >39.00)
LDL CALC: 99 mg/dL (ref 0–99)
NonHDL: 113.2
Triglycerides: 72 mg/dL (ref 0.0–149.0)
VLDL: 14.4 mg/dL (ref 0.0–40.0)

## 2014-04-11 LAB — HEPATIC FUNCTION PANEL
ALT: 15 U/L (ref 0–35)
AST: 17 U/L (ref 0–37)
Albumin: 4 g/dL (ref 3.5–5.2)
Alkaline Phosphatase: 69 U/L (ref 39–117)
BILIRUBIN TOTAL: 0.6 mg/dL (ref 0.2–1.2)
Bilirubin, Direct: 0.1 mg/dL (ref 0.0–0.3)
Total Protein: 6.8 g/dL (ref 6.0–8.3)

## 2014-04-11 LAB — TSH: TSH: 0.03 u[IU]/mL — ABNORMAL LOW (ref 0.35–4.50)

## 2014-04-11 NOTE — Assessment & Plan Note (Signed)
The patient has hypercholesterolemia.  She is not having any side effects from the lovastatin.

## 2014-04-11 NOTE — Assessment & Plan Note (Signed)
No chest pain.  No dizziness.  Blood pressure today is borderline elevated.  She will start checking it at home.

## 2014-04-11 NOTE — Patient Instructions (Addendum)
Will obtain labs today and call you with the results (LP.BMET.HFP.TSH)  Your physician recommends that you continue on your current medications as directed. Please refer to the Current Medication list given to you today.  Your physician wants you to follow-up in: 4 months with fasting labs (lp/bmet/hfp/tsh)  You will receive a reminder letter in the mail two months in advance. If you don't receive a letter, please call our office to schedule the follow-up appointment.

## 2014-04-11 NOTE — Assessment & Plan Note (Signed)
The patient is clinically euthyroid on current dose of Synthroid 100 g daily.  TSH today is pending.

## 2014-04-11 NOTE — Progress Notes (Signed)
Samantha Quinn Date of Birth:  12-20-1927 [place] [address] [place], [place]  [address] [phone]        Fax   [phone]   History of Present Illness: This pleasant 80 year old woman is seen for a followup office visit. The patient was hospitalized in March 2013 for a right lung mass and underwent surgery by Dr. Servando Snare for a adenocarcinoma of the right lung. She did very well postoperatively.  The patient has a past history of hypercholesterolemia and essential hypertension. She also has mild diabetes mellitus. She has hypothyroidism and a history of a multinodular goiter which has been known since 51. She is on Synthroid.  She had a needle biopsy of her thyroid nodule on 09/07/13 and the histology was benign.  She has seen Dr. Ernesto Rutherford for her goiter. She does not have any history of ischemic heart disease. Since last visit she has had no new cardiac symptoms.  She notes that her blood sugar and her blood pressure tended go up around the holidays. She is purchasing a town home here in [place] and plans to move next April.   Current Outpatient Prescriptions  Medication Sig Dispense Refill  . amitriptyline (ELAVIL) 10 MG tablet TAKE ONE-HALF TABLET BY MOUTH AT BEDTIME FOR SLEEP 45 tablet 1  . bisoprolol-hydrochlorothiazide (ZIAC) 2.5-6.25 MG per tablet TAKE ONE TABLET BY MOUTH ONCE DAILY 90 tablet 0  . clobetasol ointment (TEMOVATE) 7.85 % Apply 1 application topically daily as needed (for rash).     . Fluorouracil 5 % SOLN     . ibuprofen (ADVIL,MOTRIN) 200 MG tablet Take 200 mg by mouth every 6 (six) hours as needed for fever, headache or mild pain.     [name] imiquimod (ALDARA) 5 % cream     . levothyroxine (SYNTHROID, LEVOTHROID) 100 MCG tablet TAKE ONE TABLET BY MOUTH ONCE DAILY 90 tablet 0  . lovastatin (MEVACOR) 20 MG tablet Take 1 tablet (20 mg total) by mouth at bedtime. 90 tablet 0  . meclizine (ANTIVERT) 25 MG tablet Take 25 mg by mouth 3  (three) times daily as needed for dizziness.    . metFORMIN (GLUCOPHAGE) 500 MG tablet Take 1 tablet (500 mg total) by mouth 2 (two) times daily with a meal. 180 tablet 3  . ramipril (ALTACE) 2.5 MG capsule TAKE ONE CAPSULE BY MOUTH ONCE DAILY 90 capsule 0   No current facility-administered medications for this visit.    Allergies  Allergen Reactions  . Codeine Nausea And Vomiting  . Demerol     nausea  . Tramadol     syncope    Patient Active Problem List   Diagnosis Date Noted  . Cough 12/12/2013  . Multinodular goiter (nontoxic) 09/29/2012  . Anemia 09/09/2011  . Pneumothorax after biopsy 07/09/2011    Class: Acute  . Primary adenocarcinoma of lung 06/24/2011  . Abnormal chest x-ray 06/18/2011  . Benign hypertensive heart disease without heart failure 10/20/2010  . Hypercholesterolemia 10/20/2010  . Type II or unspecified type diabetes mellitus without mention of complication, uncontrolled 10/20/2010  . Hypothyroidism (acquired) 10/20/2010  . Osteoarthritis 10/20/2010    History  Smoking status  . Never Smoker   Smokeless tobacco  . Not on file    History  Alcohol Use No    Family History  Problem Relation Age of Onset  . Arthritis Sister   . Arthritis Sister   . Diabetes Brother   . Hypertension Mother   .  Anesthesia problems Neg Hx   . Hypotension Neg Hx   . Malignant hyperthermia Neg Hx   . Pseudochol deficiency Neg Hx   . Heart attack Neg Hx   . Hypertension Father   . Hypertension Sister     Review of Systems: Constitutional: no fever chills diaphoresis or fatigue or change in weight.  Head and neck: no hearing loss, no epistaxis, no photophobia or visual disturbance. Respiratory: No cough, shortness of breath or wheezing. Cardiovascular: No chest pain peripheral edema, palpitations. Gastrointestinal: No abdominal distention, no abdominal pain, no change in bowel habits hematochezia or melena. Genitourinary: No dysuria, no frequency, no urgency,  no nocturia. Musculoskeletal:No arthralgias, no back pain, no gait disturbance or myalgias. Neurological: No dizziness, no headaches, no numbness, no seizures, no syncope, no weakness, no tremors. Hematologic: No lymphadenopathy, no easy bruising. Psychiatric: No confusion, no hallucinations, no sleep disturbance.    Physical Exam: Filed Vitals:   04/11/14 1030  BP: 142/78  Pulse: 67   the general appearance reveals a well-developed well-nourished elderly woman in no distress.The head and neck exam reveals pupils equal and reactive.  Extraocular movements are full.  There is no scleral icterus.  The mouth and pharynx are normal.  The neck is supple.  The carotids reveal no bruits.  The jugular venous pressure is normal.  The  thyroid is not enlarged.  There is no lymphadenopathy.  The chest is clear to percussion and auscultation.  There are no rales or rhonchi.  Expansion of the chest is symmetrical.  The precordium is quiet.  The first heart sound is normal.  The second heart sound is physiologically split.  There is no murmur gallop rub or click.  There is no abnormal lift or heave.  The abdomen is soft and nontender.  The bowel sounds are normal.  The liver and spleen are not enlarged.  There are no abdominal masses.  There are no abdominal bruits.  Extremities reveal good pedal pulses.  There is no phlebitis or edema.  There is no cyanosis or clubbing.  Strength is normal and symmetrical in all extremities.  There is no lateralizing weakness.  There are no sensory deficits.  The skin is warm and dry.  There is no rash.     Assessment / Plan: 1. essential hypertension without heart failure 2. Hypercholesterolemia 3. nontoxic goiter 4. past history of adenocarcinoma of right lung status post surgery in March 2013, no evidence of recurrence. 5. adult onset diabetes mellitus 6. osteoarthritis  Disposition: Continue same medication.  Recheck in 4 months for office visit EKG lipid panel  hepatic function panel basal metabolic panel and TSH

## 2014-04-12 NOTE — Progress Notes (Signed)
Quick Note:  Please report to patient. The recent labs are stable. Continue same medication and careful diet. Blood sugar is higher. Watch diet. ______

## 2014-04-13 ENCOUNTER — Other Ambulatory Visit: Payer: Medicare Other

## 2014-04-23 ENCOUNTER — Telehealth: Payer: Self-pay

## 2014-04-23 DIAGNOSIS — G4700 Insomnia, unspecified: Secondary | ICD-10-CM

## 2014-04-23 MED ORDER — AMITRIPTYLINE HCL 10 MG PO TABS: ORAL_TABLET | ORAL | Status: DC

## 2014-04-23 NOTE — Telephone Encounter (Signed)
Refilled as requested  

## 2014-04-23 NOTE — Telephone Encounter (Signed)
OK to refill amitriptyline

## 2014-04-25 DIAGNOSIS — L570 Actinic keratosis: Secondary | ICD-10-CM | POA: Diagnosis not present

## 2014-04-25 DIAGNOSIS — Z85828 Personal history of other malignant neoplasm of skin: Secondary | ICD-10-CM | POA: Diagnosis not present

## 2014-04-25 DIAGNOSIS — L820 Inflamed seborrheic keratosis: Secondary | ICD-10-CM | POA: Diagnosis not present

## 2014-05-09 ENCOUNTER — Other Ambulatory Visit: Payer: Self-pay

## 2014-05-09 MED ORDER — RAMIPRIL 2.5 MG PO CAPS: ORAL_CAPSULE | ORAL | Status: DC

## 2014-06-12 ENCOUNTER — Other Ambulatory Visit: Payer: Self-pay | Admitting: Cardiology

## 2014-07-25 ENCOUNTER — Other Ambulatory Visit: Payer: Self-pay | Admitting: Cardiology

## 2014-08-28 ENCOUNTER — Other Ambulatory Visit: Payer: Medicare Other

## 2014-08-28 ENCOUNTER — Ambulatory Visit: Payer: Medicare Other | Admitting: Cardiology

## 2014-09-05 ENCOUNTER — Other Ambulatory Visit: Payer: Self-pay | Admitting: Cardiology

## 2014-09-25 ENCOUNTER — Ambulatory Visit (INDEPENDENT_AMBULATORY_CARE_PROVIDER_SITE_OTHER): Payer: Medicare Other | Admitting: Cardiology

## 2014-09-25 ENCOUNTER — Other Ambulatory Visit (INDEPENDENT_AMBULATORY_CARE_PROVIDER_SITE_OTHER): Payer: Medicare Other | Admitting: *Deleted

## 2014-09-25 ENCOUNTER — Encounter: Payer: Self-pay | Admitting: Cardiology

## 2014-09-25 VITALS — BP 122/66 | HR 72 | Ht 61.0 in | Wt 123.0 lb

## 2014-09-25 DIAGNOSIS — I119 Hypertensive heart disease without heart failure: Secondary | ICD-10-CM | POA: Diagnosis not present

## 2014-09-25 DIAGNOSIS — E039 Hypothyroidism, unspecified: Secondary | ICD-10-CM | POA: Diagnosis not present

## 2014-09-25 DIAGNOSIS — E7800 Pure hypercholesterolemia, unspecified: Secondary | ICD-10-CM

## 2014-09-25 LAB — LIPID PANEL
Cholesterol: 149 mg/dL (ref 0–200)
HDL: 51.6 mg/dL (ref >39.00)
LDL CALC: 77 mg/dL (ref 0–99)
NonHDL: 97.4
Total CHOL/HDL Ratio: 3
Triglycerides: 103 mg/dL (ref 0.0–149.0)
VLDL: 20.6 mg/dL (ref 0.0–40.0)

## 2014-09-25 LAB — TSH: TSH: 0.02 u[IU]/mL — ABNORMAL LOW (ref 0.35–4.50)

## 2014-09-25 LAB — HEPATIC FUNCTION PANEL
ALT: 15 U/L (ref 0–35)
AST: 17 U/L (ref 0–37)
Albumin: 4.1 g/dL (ref 3.5–5.2)
Alkaline Phosphatase: 64 U/L (ref 39–117)
BILIRUBIN DIRECT: 0.1 mg/dL (ref 0.0–0.3)
BILIRUBIN TOTAL: 0.5 mg/dL (ref 0.2–1.2)
Total Protein: 7 g/dL (ref 6.0–8.3)

## 2014-09-25 LAB — BASIC METABOLIC PANEL
BUN: 15 mg/dL (ref 6–23)
CO2: 28 mEq/L (ref 19–32)
CREATININE: 0.6 mg/dL (ref 0.40–1.20)
Calcium: 9.3 mg/dL (ref 8.4–10.5)
Chloride: 103 mEq/L (ref 96–112)
GFR: 101.06 mL/min (ref >60.00)
Glucose, Bld: 118 mg/dL — ABNORMAL HIGH (ref 70–99)
Potassium: 3.7 mEq/L (ref 3.5–5.1)
Sodium: 137 mEq/L (ref 135–145)

## 2014-09-25 NOTE — Progress Notes (Signed)
Cardiology Office Note   Date:  09/25/2014   ID:  Samantha Quinn, DOB [birth year], MRN [id number]  PCP:  Warren Danes, MD  Cardiologist: [name] MD  No chief complaint on file.     History of Present Illness: Samantha Quinn is a 81 y.o. female who presents for a four-month follow-up office visit  This pleasant 81 year old woman is seen for a followup office visit. The patient was hospitalized in March 2013 for a right lung mass and underwent surgery by Dr. Servando Snare for a adenocarcinoma of the right lung. She did very well postoperatively.  The patient has a past history of hypercholesterolemia and essential hypertension. She also has mild diabetes mellitus. She has hypothyroidism and a history of a multinodular goiter which has been known since 45. She is on Synthroid. She had a needle biopsy of her thyroid nodule on 09/07/13 and the histology was benign. She has seen Dr. Ernesto Rutherford for her goiter. She does not have any history of ischemic heart disease. Since last visit she has had no new cardiac symptoms. She denies any chest pain or shortness of breath. She has been having more pain in her back and down her left leg.  She has a history of a protruding disc of her spine.  Dr. Carloyn Manner is her neurosurgeon. Since we last saw her, the patient has moved to a townhouse in [place].  She now lives only 10 minutes away from her daughter.  She is excited about the move. Past Medical History  Diagnosis Date  . Diabetes mellitus   . Hyperlipidemia     takes Lovastatin every other day  . Hypercholesterolemia   . Dyslipidemia   . Myalgia   . Primary adenocarcinoma of lung 06/24/2011  . PONV (postoperative nausea and vomiting)   . Osteoarthritis   . Pneumothorax of right lung after biopsy 07/09/11    HAD CHEST TUBE PLACEMENT  . Hypertension     takes Ziac daily and Ramipril as well  . Hypothyroidism     takes Synthroid every other day  . Embolism - blood clot     hx of in left  lower leg and was on Coumadin for about 45month;this was about 8-136yrago  . Lung mass   . Bronchitis     hx of-many,many years   . Upper respiratory infection 04/2011  . Hx of migraines     as a teenager  . Back pain     bulding disc  . Osteoporosis   . Joint pain   . Skin cancer     legs have dark spots on them  . History of colonic polyps   . Urinary incontinence     takes Detrol daily prn  . Diabetes mellitus     type 2 and takes Metformin bid  . Insomnia     takes Elavil nightly    Past Surgical History  Procedure Laterality Date  . Cholecystectomy    . Hemorrhoid surgery    . Colonoscopy    . Lung biopsy  07/09/11    RIGHT UPER LOBE LUNG MASS   . Right chest tube placement  07/10/11    S/P LUNG BX  . Chest tube removal  07/13/11    RIGHT  CHEST TUBE  . Tonsillectomy      as a child  . Appendectomy      as a child  . Dilation and curettage of uterus      x 2  .  Abdominal hysterectomy    . Rotator cuff repair      bilateral  . Toe surgery  2013    right small toe  . Colonoscopy    . Video bronchoscopy  07/17/2011    Procedure: VIDEO BRONCHOSCOPY;  Surgeon: Grace Isaac, MD;  Location: [facility] OR;  Service: Thoracic;  Laterality: N/A;     Current Outpatient Prescriptions  Medication Sig Dispense Refill  . amitriptyline (ELAVIL) 10 MG tablet TAKE ONE-HALF TABLET BY MOUTH AT BEDTIME FOR SLEEP 45 tablet 3  . bisoprolol-hydrochlorothiazide (ZIAC) 2.5-6.25 MG per tablet TAKE ONE TABLET BY MOUTH ONCE DAILY 90 tablet 1  . clobetasol ointment (TEMOVATE) 9.98 % Apply 1 application topically daily as needed (for rash).     [name] ibuprofen (ADVIL,MOTRIN) 200 MG tablet Take 200 mg by mouth every 6 (six) hours as needed for fever, headache or mild pain.     [name] levothyroxine (SYNTHROID, LEVOTHROID) 100 MCG tablet TAKE ONE TABLET BY MOUTH ONCE DAILY 90 tablet 0  . lovastatin (MEVACOR) 20 MG tablet Take 1 tablet (20 mg total) by mouth at bedtime. 90 tablet 0  . meclizine  (ANTIVERT) 25 MG tablet Take 25 mg by mouth 3 (three) times daily as needed for dizziness.    . metFORMIN (GLUCOPHAGE) 500 MG tablet Take 1 tablet (500 mg total) by mouth 2 (two) times daily with a meal. 180 tablet 3  . ramipril (ALTACE) 2.5 MG capsule TAKE ONE CAPSULE BY MOUTH ONCE DAILY 90 capsule 3   No current facility-administered medications for this visit.    Allergies:   Codeine; Demerol; and Tramadol    Social History:  The patient  reports that she has never smoked. She does not have any smokeless tobacco history on file. She reports that she does not drink alcohol or use illicit drugs.   Family History:  The patient's family history includes Arthritis in her sister and sister; Diabetes in her brother; Hypertension in her father, mother, and sister. There is no history of Anesthesia problems, Hypotension, Malignant hyperthermia, Pseudochol deficiency, or Heart attack.    ROS:  Please see the history of present illness.   Otherwise, review of systems are positive for none.   All other systems are reviewed and negative.    PHYSICAL EXAM: VS:  BP 122/66 mmHg  Pulse 72  Ht '5\' 1"'$  (1.549 m)  Wt 123 lb (55.792 kg)  BMI 23.25 kg/m2 , BMI Body mass index is 23.25 kg/(m^2). GEN: Well nourished, well developed, in no acute distress HEENT: normal Neck: no JVD, carotid bruits, or masses Cardiac: RRR; no murmurs, rubs, or gallops,no edema  Respiratory:  clear to auscultation bilaterally, normal work of breathing GI: soft, nontender, nondistended, + BS MS: no deformity or atrophy Skin: warm and dry, no rash Neuro:  Strength and sensation are intact Psych: euthymic mood, full affect   EKG:  EKG is ordered today. The ekg ordered today demonstrates normal sinus rhythm.  Within normal limits.   Recent Labs: 12/12/2013: Hemoglobin 12.9; Platelets 295.0 09/25/2014: ALT 15; BUN 15; Creatinine 0.60; Potassium 3.7; Sodium 137; TSH 0.02*    Lipid Panel    Component Value Date/Time    CHOL 149 09/25/2014 1110   TRIG 103.0 09/25/2014 1110   HDL 51.60 09/25/2014 1110   CHOLHDL 3 09/25/2014 1110   VLDL 20.6 09/25/2014 1110   LDLCALC 77 09/25/2014 1110      Wt Readings from Last 3 Encounters:  09/25/14 123 lb (55.792 kg)  04/11/14 129  lb 12.8 oz (58.877 kg)  12/12/13 127 lb (57.607 kg)        ASSESSMENT AND PLAN:  1.  essential hypertension without heart failure 2. Hypercholesterolemia 3. nontoxic goiter 4. past history of adenocarcinoma of right lung status post surgery in March 2013, no evidence of recurrence. 5. adult onset diabetes mellitus 6. Osteoarthritis 7.  Lumbar disc protrusion, followed by Dr. Carloyn Manner  Disposition: Continue same medication. Recheck in 4 months for office visit lipid panel hepatic function panel basal metabolic panel and TSH    Current medicines are reviewed at length with the patient today.  The patient does not have concerns regarding medicines.  The following changes have been made:  no change  Labs/ tests ordered today include:   Orders Placed This Encounter  Procedures  . Lipid panel  . Hepatic function panel  . Basic metabolic panel  . TSH  . EKG 12-Lead      Signed, [name] MD 09/25/2014 2:13 PM    [facility] [place], [place]   [address] Phone: [phone]; Fax: [phone]

## 2014-09-25 NOTE — Patient Instructions (Signed)
Medication Instructions:  Your physician recommends that you continue on your current medications as directed. Please refer to the Current Medication list given to you today.  Labwork: LP/BMET/HFP/TSH  Testing/Procedures: NONE  Follow-Up: Your physician wants you to follow-up in: 4 months with fasting labs (lp/bmet/hfp/TSH)  You will receive a reminder letter in the mail two months in advance. If you don't receive a letter, please call our office to schedule the follow-up appointment.   Any Other Special Instructions Will Be Listed Below (If Applicable).

## 2014-09-26 NOTE — Progress Notes (Signed)
Quick Note:  Please report to patient. The recent labs are stable. Continue same medication and careful diet. ______ 

## 2014-10-01 ENCOUNTER — Other Ambulatory Visit: Payer: Self-pay | Admitting: *Deleted

## 2014-10-01 ENCOUNTER — Other Ambulatory Visit: Payer: Self-pay | Admitting: Cardiothoracic Surgery

## 2014-10-01 DIAGNOSIS — C3490 Malignant neoplasm of unspecified part of unspecified bronchus or lung: Secondary | ICD-10-CM

## 2014-10-01 MED ORDER — LOVASTATIN 20 MG PO TABS: 20.0000 mg | ORAL_TABLET | Freq: Every day | ORAL | Status: DC

## 2014-10-02 ENCOUNTER — Other Ambulatory Visit: Payer: Self-pay | Admitting: Neurosurgery

## 2014-10-02 DIAGNOSIS — M47816 Spondylosis without myelopathy or radiculopathy, lumbar region: Secondary | ICD-10-CM | POA: Diagnosis not present

## 2014-10-02 DIAGNOSIS — M5126 Other intervertebral disc displacement, lumbar region: Secondary | ICD-10-CM

## 2014-10-11 ENCOUNTER — Ambulatory Visit
Admission: RE | Admit: 2014-10-11 | Discharge: 2014-10-11 | Disposition: A | Discharge status: Home / Self Care | Payer: Medicare Other | Source: Ambulatory Visit | Attending: Neurosurgery | Admitting: Neurosurgery

## 2014-10-11 DIAGNOSIS — M5126 Other intervertebral disc displacement, lumbar region: Secondary | ICD-10-CM

## 2014-10-11 DIAGNOSIS — M47816 Spondylosis without myelopathy or radiculopathy, lumbar region: Secondary | ICD-10-CM | POA: Diagnosis not present

## 2014-10-23 DIAGNOSIS — M5126 Other intervertebral disc displacement, lumbar region: Secondary | ICD-10-CM | POA: Diagnosis not present

## 2014-10-23 DIAGNOSIS — M47816 Spondylosis without myelopathy or radiculopathy, lumbar region: Secondary | ICD-10-CM | POA: Diagnosis not present

## 2014-10-30 ENCOUNTER — Other Ambulatory Visit: Payer: Self-pay | Admitting: Neurosurgery

## 2014-10-30 DIAGNOSIS — M47816 Spondylosis without myelopathy or radiculopathy, lumbar region: Secondary | ICD-10-CM

## 2014-10-31 DIAGNOSIS — L821 Other seborrheic keratosis: Secondary | ICD-10-CM | POA: Diagnosis not present

## 2014-10-31 DIAGNOSIS — D485 Neoplasm of uncertain behavior of skin: Secondary | ICD-10-CM | POA: Diagnosis not present

## 2014-10-31 DIAGNOSIS — Z85828 Personal history of other malignant neoplasm of skin: Secondary | ICD-10-CM | POA: Diagnosis not present

## 2014-10-31 DIAGNOSIS — D1801 Hemangioma of skin and subcutaneous tissue: Secondary | ICD-10-CM | POA: Diagnosis not present

## 2014-10-31 DIAGNOSIS — D225 Melanocytic nevi of trunk: Secondary | ICD-10-CM | POA: Diagnosis not present

## 2014-10-31 DIAGNOSIS — L570 Actinic keratosis: Secondary | ICD-10-CM | POA: Diagnosis not present

## 2014-11-05 ENCOUNTER — Telehealth: Payer: Self-pay | Admitting: Cardiology

## 2014-11-05 ENCOUNTER — Ambulatory Visit
Admission: RE | Admit: 2014-11-05 | Discharge: 2014-11-05 | Disposition: A | Discharge status: Home / Self Care | Payer: Medicare Other | Source: Ambulatory Visit | Attending: Neurosurgery | Admitting: Neurosurgery

## 2014-11-05 DIAGNOSIS — M47816 Spondylosis without myelopathy or radiculopathy, lumbar region: Secondary | ICD-10-CM

## 2014-11-05 DIAGNOSIS — M545 Low back pain: Secondary | ICD-10-CM | POA: Diagnosis not present

## 2014-11-05 MED ORDER — IOHEXOL 180 MG/ML  SOLN
1.0000 mL | Freq: Once | INTRAMUSCULAR | Status: AC | PRN
  Administered 2014-11-05: 1 mL via EPIDURAL

## 2014-11-05 MED ORDER — METHYLPREDNISOLONE ACETATE 40 MG/ML INJ SUSP (RADIOLOG
120.0000 mg | Freq: Once | INTRAMUSCULAR | Status: AC
  Administered 2014-11-05: 120 mg via EPIDURAL

## 2014-11-05 NOTE — Telephone Encounter (Signed)
Patient had steroid injection this am. Was told to start to monitor her blood sugar and call her physician with guidelines and possible medication adjustments    Patient states she does not check her blood sugars on a regular basis but usually run 120's-130's Explained to patient blood sugars can run up after steroid injections and she needs to watch her diet closely with sugar/carbs intake Will forward to  Dr. Mare Ferrari for review

## 2014-11-05 NOTE — Telephone Encounter (Signed)
New message     Pt has question about checking her blood sugar for you.  Please call to advise.

## 2014-11-05 NOTE — Discharge Instructions (Signed)

## 2014-11-05 NOTE — Telephone Encounter (Signed)
I agree with advice given

## 2014-11-08 ENCOUNTER — Encounter: Payer: Self-pay | Admitting: Cardiothoracic Surgery

## 2014-11-08 ENCOUNTER — Ambulatory Visit
Admission: RE | Admit: 2014-11-08 | Discharge: 2014-11-08 | Disposition: A | Discharge status: Home / Self Care | Payer: Medicare Other | Source: Ambulatory Visit | Attending: Cardiothoracic Surgery | Admitting: Cardiothoracic Surgery

## 2014-11-08 ENCOUNTER — Ambulatory Visit (INDEPENDENT_AMBULATORY_CARE_PROVIDER_SITE_OTHER): Payer: Medicare Other | Admitting: Cardiothoracic Surgery

## 2014-11-08 VITALS — BP 124/67 | HR 65 | Resp 16 | Ht 61.5 in | Wt 121.0 lb

## 2014-11-08 DIAGNOSIS — Z902 Acquired absence of lung [part of]: Secondary | ICD-10-CM

## 2014-11-08 DIAGNOSIS — C3490 Malignant neoplasm of unspecified part of unspecified bronchus or lung: Secondary | ICD-10-CM

## 2014-11-08 DIAGNOSIS — R911 Solitary pulmonary nodule: Secondary | ICD-10-CM | POA: Diagnosis not present

## 2014-11-08 DIAGNOSIS — C3412 Malignant neoplasm of upper lobe, left bronchus or lung: Secondary | ICD-10-CM | POA: Diagnosis not present

## 2014-11-08 DIAGNOSIS — Z9889 Other specified postprocedural states: Secondary | ICD-10-CM | POA: Diagnosis not present

## 2014-11-08 NOTE — Progress Notes (Signed)
[address]       [place] [address]             [phone]                       Jeni M Mctighe Donnellson Medical Record #[id number] Date of Birth: 06/02/1927  [name], MD  Chief Complaint:   PostOp Follow Up Visit 07/17/2011  OPERATIVE REPORT  PREOPERATIVE DIAGNOSIS: Non-small-cell lung cancer, right upper lobe.  POSTOPERATIVE DIAGNOSIS: Non-small-cell lung cancer, right upper lobe.  SURGICAL PROCEDURES: Bronchoscopy, right video-assisted thoracoscopy,  right upper lobectomy with node dissection, and placement of On-Q  device.   p T2a, pN0 M0  3.5 cmwell-differentiated adenocarcinoma of the right upper lobe resected 07/17/2011 ALK & EGFR negative   History of Present Illness:     Patient is an 81 year old female who underwent surgical resection of a 3.5 cm invasive well differentiated adenocarcinoma in the right upper lobe on 07/17/2011. She denies any hemoptysis or recurrent pulmonary infections . Currently she is primarily limited by chronic back pain underwent a back injection earlier this week.  Since last seen the patient's had needle biopsy of her right neck mass, benign goiter.   History  Smoking status  . Never Smoker   Smokeless tobacco  . Not on file       Allergies  Allergen Reactions  . Codeine Nausea And Vomiting  . Tramadol Other (See Comments)    syncope  . Demerol Nausea Only    Current Outpatient Prescriptions  Medication Sig Dispense Refill  . amitriptyline (ELAVIL) 10 MG tablet TAKE ONE-HALF TABLET BY MOUTH AT BEDTIME FOR SLEEP 45 tablet 3  . bisoprolol-hydrochlorothiazide (ZIAC) 2.5-6.25 MG per tablet TAKE ONE TABLET BY MOUTH ONCE DAILY 90 tablet 1  . clobetasol ointment (TEMOVATE) 0.27 % Apply 1 application topically daily as needed (for rash).     [name] ibuprofen (ADVIL,MOTRIN) 200 MG tablet Take 200 mg by mouth every 6 (six) hours as needed for fever, headache or mild pain.     [name]  lovastatin (MEVACOR) 20 MG tablet Take 1 tablet (20 mg total) by mouth at bedtime. (Patient taking differently: Take 20 mg by mouth every other day. ) 90 tablet 1  . meclizine (ANTIVERT) 25 MG tablet Take 25 mg by mouth 3 (three) times daily as needed for dizziness.    . metFORMIN (GLUCOPHAGE) 500 MG tablet Take 1 tablet (500 mg total) by mouth 2 (two) times daily with a meal. 180 tablet 3  . ramipril (ALTACE) 2.5 MG capsule TAKE ONE CAPSULE BY MOUTH ONCE DAILY 90 capsule 3  . levothyroxine (SYNTHROID, LEVOTHROID) 100 MCG tablet TAKE ONE TABLET BY MOUTH ONCE DAILY (Patient taking differently: TAKE EVERY OTHER DAY) 90 tablet 0   No current facility-administered medications for this visit.       Physical Exam: BP 124/67 mmHg  Pulse 65  Resp 16  Ht 5' 1.5" (1.562 m)  Wt 121 lb (54.885 kg)  BMI 22.50 kg/m2  SpO2 98%  General appearance: alert and cooperative Neurologic: intact Neck: The right thyroid lobe is slightly more prominent than the left. Heart: regular rate and rhythm, S1, S2 normal, no murmur, click, rub or gallop and normal apical impulse Lungs: clear to auscultation bilaterally and normal percussion bilaterally Abdomen: soft, non-tender; bowel sounds normal; no masses,  no organomegaly  Extremities: extremities normal, atraumatic, no cyanosis or edema Wound: Her right chest wound I do not appreciate any cervical or supraclavicular adenopathy, she does have a probable thyroid particularly the right lobe.  Diagnostic Studies & Laboratory data:         Recent Radiology Findings: Ct Chest Wo Contrast  11/08/2014   CLINICAL DATA:  Primary adenocarcinoma lung. Right upper lobe resection.  EXAM: CT CHEST WITHOUT CONTRAST  TECHNIQUE: Multidetector CT imaging of the chest was performed following the standard protocol without IV contrast.  COMPARISON:  10/12/2013  FINDINGS: Mediastinum/Nodes: No axillary supraclavicular lymphadenopathy. No mediastinal hilar lymphadenopathy. No  pericardial fluid.  Nodule enlargement of the right lobe of thyroid gland is again demonstrated measuring 2.3 cm.  Lungs/Pleura: Postsurgical change in the right upper lobe with mild thickening along the surgical margin superior to the right hilum (coronal image 66). This is not changed from comparison exam. There is no new or suspicious pulmonary nodules in the right lung. Left lung is clear.  Rounded 4 mm mm nodule in the right lung base left lung base on image 35, series 4 compares to 4 mm on prior remeasured  Upper abdomen: There is a large peripherally calcified density lesion the spleen, stable over multiple comparisons. No intraperitoneal free air.  Musculoskeletal: No aggressive osseous lesion.  IMPRESSION: 1. Stable postoperative change in the right hemithorax without recurrence. 2. Stable left lower lobe pulmonary nodule.   Electronically Signed   By: Suzy Bouchard M.D.   On: 11/08/2014 14:19   Ct Chest Wo Contrast  10/12/2013   CLINICAL DATA:  History of lung cancer status post right upper lobectomy in 2013. Followup study.  EXAM: CT CHEST WITHOUT CONTRAST  TECHNIQUE: Multidetector CT imaging of the chest was performed following the standard protocol without IV contrast.  COMPARISON:  Chest CT 03/30/2013.  FINDINGS: Mediastinum: Heart size is normal. There is no significant pericardial fluid, thickening or pericardial calcification. Slight non aneurysmal prominence of the ascending thoracic aorta (3.7 cm in diameter) is incidentally noted, and is similar to prior studies. No pathologically enlarged mediastinal or hilar lymph nodes. Please note that accurate exclusion of hilar adenopathy is limited on noncontrast CT scans. Esophagus is unremarkable in appearance.  Lungs/Pleura: Status post right upper lobectomy. Small amount of scarring in the periphery of the right lower lobe is unchanged compared to prior examinations. 3 mm left lower lobe nodule (image 35 of series 4) is unchanged compared to  prior study 06/13/2011 and can be considered benign requiring no imaging followup. No other suspicious appearing pulmonary nodules or masses are noted. No acute consolidative airspace disease. No pleural effusions.  Upper Abdomen: Large rim calcified low-attenuation lesion in the spleen measuring 5.8 cm in diameter is similar to prior studies, presumably a posttraumatic cyst. Status post cholecystectomy.  Musculoskeletal: There are no aggressive appearing lytic or blastic lesions noted in the visualized portions of the skeleton. Soft tissue anchors in the right humeral head, presumably related to prior rotator cuff repair.  IMPRESSION: 1. Status post right upper lobectomy. No findings to suggest local recurrence of disease or metastatic disease in the thorax. 2. Incidental findings, as above, similar prior studies.   Electronically Signed   By: Vinnie Langton M.D.   On: 10/12/2013 12:34   Ct Chest Wo Contrast  03/30/2013   CLINICAL DATA:  Status post right upper lobe lung resection for neoplastic disease surveillance evaluation.  EXAM: CT CHEST WITHOUT CONTRAST  TECHNIQUE: Multidetector CT imaging of the chest was  performed following the standard protocol without IV contrast.  COMPARISON:  09/08/2012, 02/04/2012  FINDINGS: Evaluation of the thoracic inlet demonstrates a lobular mass which appears stable in the right lobe of the thyroid, again consistent with a thyroid goiter. The thoracic inlet is otherwise unremarkable. The maximal diameter of the ascending aorta is also unchanged at 3.6 mm. Atherosclerotic calcifications identified within the aorta. There is no evidence of mediastinal or hilar adenopathy nor masses.  The lung parenchyma once again demonstrates postsurgical changes along the anterior peripheral base of the right lower lobe. Postsurgical changes, stable, are identified within the right hilar region. There is otherwise no evidence of pulmonary nodules, masses, infiltrates, effusions nor edema.   A stable calcified splenic cyst is again identified.  Remaining visualized upper abdominal viscera are unremarkable.  IMPRESSION: 1. Stable postop changes within the right hemi thorax consistent with a right upper lobe lobectomy 2. No evidence of mediastinal nor hilar adenopathy 3. Probable border within the thyroid 4. Stable diameter of the ascending aorta   Electronically Signed   By: Margaree Mackintosh M.D.   On: 03/30/2013 12:19      Recent Labs: Lab Results  Component Value Date   WBC 5.7 12/12/2013   HGB 12.9 12/12/2013   HCT 38.5 12/12/2013   PLT 295.0 12/12/2013   GLUCOSE 118* 09/25/2014   CHOL 149 09/25/2014   TRIG 103.0 09/25/2014   HDL 51.60 09/25/2014   LDLCALC 77 09/25/2014   ALT 15 09/25/2014   AST 17 09/25/2014   NA 137 09/25/2014   K 3.7 09/25/2014   CL 103 09/25/2014   CREATININE 0.60 09/25/2014   BUN 15 09/25/2014   CO2 28 09/25/2014   TSH 0.02* 09/25/2014   INR 0.96 07/16/2011   HGBA1C 6.5 12/12/2013      Assessment / Plan:      p T2a, pN0 M0 well-differentiated adenocarcinoma of the right upper lobe resected 07/17/2011, without evidence of recurrence on serial CT scan Her ascending aorta is  mildly dilated, 3.6 cm unchanged  The right lobe of the thyroid is slightly enlarged consistent with goiter which she is being treated for./ Biopsy negative  Large rim calcified low-attenuation lesion in the spleen measuring 5.8 cm in diameter is similar to prior studies, presumably a posttraumatic cyst  plan to see her back in 12 months with a followup CT scan of the chest  Grace Isaac MD 11/08/2014 3:06 PM

## 2014-12-24 ENCOUNTER — Telehealth: Payer: Self-pay | Admitting: Cardiology

## 2014-12-24 DIAGNOSIS — E119 Type 2 diabetes mellitus without complications: Secondary | ICD-10-CM

## 2014-12-24 MED ORDER — METFORMIN HCL 500 MG PO TABS: 500.0000 mg | ORAL_TABLET | Freq: Two times a day (BID) | ORAL | Status: DC

## 2014-12-24 NOTE — Telephone Encounter (Signed)
New message     Pt has questions regarding metformin

## 2014-12-24 NOTE — Telephone Encounter (Signed)
Refilled Metformin as requested

## 2015-01-30 DIAGNOSIS — Z23 Encounter for immunization: Secondary | ICD-10-CM | POA: Diagnosis not present

## 2015-01-30 DIAGNOSIS — M5126 Other intervertebral disc displacement, lumbar region: Secondary | ICD-10-CM | POA: Diagnosis not present

## 2015-01-31 DIAGNOSIS — Z85828 Personal history of other malignant neoplasm of skin: Secondary | ICD-10-CM | POA: Diagnosis not present

## 2015-01-31 DIAGNOSIS — L570 Actinic keratosis: Secondary | ICD-10-CM | POA: Diagnosis not present

## 2015-01-31 DIAGNOSIS — L820 Inflamed seborrheic keratosis: Secondary | ICD-10-CM | POA: Diagnosis not present

## 2015-02-11 ENCOUNTER — Telehealth: Payer: Self-pay | Admitting: Cardiology

## 2015-02-11 NOTE — Telephone Encounter (Signed)
Pt requesting a refill of levothyroxine 100 mcg. Would like to refill this medication. Please advise

## 2015-02-11 NOTE — Telephone Encounter (Signed)
Okay to refill levothyroxine

## 2015-02-12 MED ORDER — LEVOTHYROXINE SODIUM 100 MCG PO TABS: ORAL_TABLET | ORAL | Status: DC

## 2015-02-12 NOTE — Telephone Encounter (Signed)
Every other day appears to be working for her.  She is not hypothyroid.  Most recent TSH was not elevated.  Okay to continue taking it the way she has been taking it.

## 2015-02-12 NOTE — Telephone Encounter (Signed)
Spoke with patient and she is only taking her Levothyroxine every other day for a "long time"  Refilled like she is currently taking

## 2015-02-20 ENCOUNTER — Other Ambulatory Visit: Payer: Self-pay | Admitting: Neurosurgery

## 2015-02-20 DIAGNOSIS — M5126 Other intervertebral disc displacement, lumbar region: Secondary | ICD-10-CM

## 2015-02-27 ENCOUNTER — Ambulatory Visit
Admission: RE | Admit: 2015-02-27 | Discharge: 2015-02-27 | Disposition: A | Discharge status: Home / Self Care | Payer: Medicare Other | Source: Ambulatory Visit | Attending: Neurosurgery | Admitting: Neurosurgery

## 2015-02-27 DIAGNOSIS — M5126 Other intervertebral disc displacement, lumbar region: Secondary | ICD-10-CM

## 2015-02-27 DIAGNOSIS — M545 Low back pain: Secondary | ICD-10-CM | POA: Diagnosis not present

## 2015-02-27 MED ORDER — IOHEXOL 180 MG/ML  SOLN
1.0000 mL | Freq: Once | INTRAMUSCULAR | Status: DC | PRN
  Administered 2015-02-27: 1 mL via EPIDURAL

## 2015-02-27 MED ORDER — METHYLPREDNISOLONE ACETATE 40 MG/ML INJ SUSP (RADIOLOG
120.0000 mg | Freq: Once | INTRAMUSCULAR | Status: AC
  Administered 2015-02-27: 120 mg via EPIDURAL

## 2015-03-08 ENCOUNTER — Ambulatory Visit (INDEPENDENT_AMBULATORY_CARE_PROVIDER_SITE_OTHER): Payer: Medicare Other | Admitting: Cardiology

## 2015-03-08 ENCOUNTER — Encounter: Payer: Self-pay | Admitting: Cardiology

## 2015-03-08 VITALS — BP 122/62 | HR 70 | Ht 61.5 in | Wt 123.4 lb

## 2015-03-08 DIAGNOSIS — E039 Hypothyroidism, unspecified: Secondary | ICD-10-CM | POA: Diagnosis not present

## 2015-03-08 DIAGNOSIS — E7800 Pure hypercholesterolemia, unspecified: Secondary | ICD-10-CM | POA: Diagnosis not present

## 2015-03-08 DIAGNOSIS — I119 Hypertensive heart disease without heart failure: Secondary | ICD-10-CM | POA: Diagnosis not present

## 2015-03-08 DIAGNOSIS — E119 Type 2 diabetes mellitus without complications: Secondary | ICD-10-CM | POA: Diagnosis not present

## 2015-03-08 LAB — LIPID PANEL
CHOL/HDL RATIO: 2.3 ratio (ref <=5.0)
Cholesterol: 155 mg/dL (ref 125–200)
HDL: 66 mg/dL (ref >=46)
LDL CALC: 66 mg/dL (ref <130)
TRIGLYCERIDES: 117 mg/dL (ref <150)
VLDL: 23 mg/dL (ref <30)

## 2015-03-08 LAB — HEMOGLOBIN A1C
Hgb A1c MFr Bld: 6.4 % — ABNORMAL HIGH (ref <5.7)
Mean Plasma Glucose: 137 mg/dL — ABNORMAL HIGH (ref <117)

## 2015-03-08 LAB — CBC WITH DIFFERENTIAL/PLATELET
Basophils Absolute: 0 10*3/uL (ref 0.0–0.1)
Basophils Relative: 0 % (ref 0–1)
EOS PCT: 1 % (ref 0–5)
Eosinophils Absolute: 0.1 10*3/uL (ref 0.0–0.7)
HEMATOCRIT: 38.5 % (ref 36.0–46.0)
Hemoglobin: 13.3 g/dL (ref 12.0–15.0)
LYMPHS ABS: 2 10*3/uL (ref 0.7–4.0)
LYMPHS PCT: 20 % (ref 12–46)
MCH: 28.4 pg (ref 26.0–34.0)
MCHC: 34.5 g/dL (ref 30.0–36.0)
MCV: 82.3 fL (ref 78.0–100.0)
MONO ABS: 0.6 10*3/uL (ref 0.1–1.0)
MONOS PCT: 6 % (ref 3–12)
MPV: 10 fL (ref 8.6–12.4)
NEUTROS ABS: 7.4 10*3/uL (ref 1.7–7.7)
NEUTROS PCT: 73 % (ref 43–77)
Platelets: 328 10*3/uL (ref 150–400)
RBC: 4.68 MIL/uL (ref 3.87–5.11)
RDW: 14.2 % (ref 11.5–15.5)
WBC: 10.1 10*3/uL (ref 4.0–10.5)

## 2015-03-08 LAB — HEPATIC FUNCTION PANEL
ALBUMIN: 3.9 g/dL (ref 3.6–5.1)
ALT: 12 U/L (ref 6–29)
AST: 13 U/L (ref 10–35)
Alkaline Phosphatase: 75 U/L (ref 33–130)
BILIRUBIN INDIRECT: 0.4 mg/dL (ref 0.2–1.2)
Bilirubin, Direct: 0.1 mg/dL (ref <=0.2)
TOTAL PROTEIN: 6.5 g/dL (ref 6.1–8.1)
Total Bilirubin: 0.5 mg/dL (ref 0.2–1.2)

## 2015-03-08 LAB — T4, FREE: Free T4: 1.6 ng/dL (ref 0.80–1.80)

## 2015-03-08 LAB — BASIC METABOLIC PANEL
BUN: 18 mg/dL (ref 7–25)
CHLORIDE: 107 mmol/L (ref 98–110)
CO2: 27 mmol/L (ref 20–31)
Calcium: 9.1 mg/dL (ref 8.6–10.4)
Creat: 0.59 mg/dL — ABNORMAL LOW (ref 0.60–0.88)
Glucose, Bld: 105 mg/dL — ABNORMAL HIGH (ref 65–99)
POTASSIUM: 4.2 mmol/L (ref 3.5–5.3)
SODIUM: 144 mmol/L (ref 135–146)

## 2015-03-08 LAB — TSH: TSH: <0.008 u[IU]/mL — ABNORMAL LOW (ref 0.350–4.500)

## 2015-03-08 NOTE — Progress Notes (Signed)
Cardiology Office Note   Date:  03/08/2015   ID:  [name], DOB [birth year], MRN [id number]  PCP:  Warren Danes, MD  Cardiologist: [name] MD  Chief Complaint  Patient presents with  . Hypertension      History of Present Illness: Samantha Quinn is a 81 y.o. female who presents for a four-month follow-up visit  This pleasant 81 year old woman is seen for a followup office visit. The patient was hospitalized in March 2013 for a right lung mass and underwent surgery by Dr. Servando Snare for a adenocarcinoma of the right lung. She did very well postoperatively.  The patient has a past history of hypercholesterolemia and essential hypertension. She also has mild diabetes mellitus. She has hypothyroidism and a history of a multinodular goiter which has been known since 45. She is on Synthroid. She had a needle biopsy of her thyroid nodule on 09/07/13 and the histology was benign. She has seen Dr. Ernesto Rutherford for her goiter. She does not have any history of ischemic heart disease. Since last visit she has had no new cardiac symptoms. She denies any chest pain or shortness of breath.  She has had some intermittent weakness of her voice. Since we last saw her, the patient has moved to a townhouse in [place]. She now lives only 10 minutes away from her daughter. She is excited about the move. She received a note from Dr. Teena Irani that it is time for her follow-up colonoscopy.  Past Medical History  Diagnosis Date  . Diabetes mellitus   . Hyperlipidemia     takes Lovastatin every other day  . Hypercholesterolemia   . Dyslipidemia   . Myalgia   . Primary adenocarcinoma of lung (Samantha Quinn) 06/24/2011  . PONV (postoperative nausea and vomiting)   . Osteoarthritis   . Pneumothorax of right lung after biopsy 07/09/11    HAD CHEST TUBE PLACEMENT  . Hypertension     takes Ziac daily and Ramipril as well  . Hypothyroidism     takes Synthroid every other day  . Embolism - blood  clot     hx of in left lower leg and was on Coumadin for about 10month;this was about 8-141yrago  . Lung mass   . Bronchitis     hx of-many,many years   . Upper respiratory infection 04/2011  . Hx of migraines     as a teenager  . Back pain     bulding disc  . Osteoporosis   . Joint pain   . Skin cancer     legs have dark spots on them  . History of colonic polyps   . Urinary incontinence     takes Detrol daily prn  . Diabetes mellitus     type 2 and takes Metformin bid  . Insomnia     takes Elavil nightly    Past Surgical History  Procedure Laterality Date  . Cholecystectomy    . Hemorrhoid surgery    . Colonoscopy    . Lung biopsy  07/09/11    RIGHT UPER LOBE LUNG MASS   . Right chest tube placement  07/10/11    S/P LUNG BX  . Chest tube removal  07/13/11    RIGHT  CHEST TUBE  . Tonsillectomy      as a child  . Appendectomy      as a child  . Dilation and curettage of uterus      x 2  . Abdominal hysterectomy    .  Rotator cuff repair      bilateral  . Toe surgery  2013    right small toe  . Colonoscopy    . Video bronchoscopy  07/17/2011    Procedure: VIDEO BRONCHOSCOPY;  Surgeon: Grace Isaac, MD;  Location: [facility] OR;  Service: Thoracic;  Laterality: N/A;     Current Outpatient Prescriptions  Medication Sig Dispense Refill  . amitriptyline (ELAVIL) 10 MG tablet Take 10 mg by mouth at bedtime.    . bisoprolol-hydrochlorothiazide (ZIAC) 2.5-6.25 MG per tablet TAKE ONE TABLET BY MOUTH ONCE DAILY 90 tablet 1  . clobetasol ointment (TEMOVATE) 3.41 % Apply 1 application topically daily as needed (for rash).     [name] ibuprofen (ADVIL,MOTRIN) 200 MG tablet Take 200 mg by mouth every 6 (six) hours as needed for fever, headache or mild pain.     [name] levothyroxine (SYNTHROID, LEVOTHROID) 100 MCG tablet Take 100 mcg by mouth daily before breakfast.    . lovastatin (MEVACOR) 20 MG tablet Take 20 mg by mouth every other day.    . meclizine (ANTIVERT) 25 MG tablet Take 25 mg  by mouth 3 (three) times daily as needed for dizziness.    . metFORMIN (GLUCOPHAGE) 500 MG tablet Take 1 tablet (500 mg total) by mouth 2 (two) times daily with a meal. 180 tablet 3  . ramipril (ALTACE) 2.5 MG capsule TAKE ONE CAPSULE BY MOUTH ONCE DAILY 90 capsule 3   No current facility-administered medications for this visit.    Allergies:   Codeine; Tramadol; and Demerol    Social History:  The patient  reports that she has never smoked. She does not have any smokeless tobacco history on file. She reports that she does not drink alcohol or use illicit drugs.   Family History:  The patient's family history includes Arthritis in her sister and sister; Diabetes in her brother; Hypertension in her father, mother, and sister. There is no history of Anesthesia problems, Hypotension, Malignant hyperthermia, Pseudochol deficiency, or Heart attack.    ROS:  Please see the history of present illness.   Otherwise, review of systems are positive for none.   All other systems are reviewed and negative.    PHYSICAL EXAM: VS:  BP 122/62 mmHg  Pulse 70  Ht 5' 1.5" (1.562 m)  Wt 123 lb 6.4 oz (55.974 kg)  BMI 22.94 kg/m2 , BMI Body mass index is 22.94 kg/(m^2). GEN: Well nourished, well developed, in no acute distress HEENT: normal Neck: no JVD, carotid bruits, or masses Cardiac: RRR; no murmurs, rubs, or gallops,no edema  Respiratory:  clear to auscultation bilaterally, normal work of breathing GI: soft, nontender, nondistended, + BS MS: no deformity or atrophy Skin: warm and dry, no rash Neuro:  Strength and sensation are intact Psych: euthymic mood, full affect   EKG:  EKG is not ordered today.    Recent Labs: 09/25/2014: ALT 15; BUN 15; Creatinine, Ser 0.60; Potassium 3.7; Sodium 137; TSH 0.02*    Lipid Panel    Component Value Date/Time   CHOL 149 09/25/2014 1110   TRIG 103.0 09/25/2014 1110   HDL 51.60 09/25/2014 1110   CHOLHDL 3 09/25/2014 1110   VLDL 20.6 09/25/2014 1110     LDLCALC 77 09/25/2014 1110      Wt Readings from Last 3 Encounters:  03/08/15 123 lb 6.4 oz (55.974 kg)  11/08/14 121 lb (54.885 kg)  09/25/14 123 lb (55.792 kg)        ASSESSMENT AND PLAN:  1.  essential hypertension without heart failure 2. Hypercholesterolemia 3. nontoxic goiter 4. past history of adenocarcinoma of right lung status post surgery in March 2013, no evidence of recurrence. 5. adult onset diabetes mellitus 6.  Past history of colon polyps.  She is due for another colonoscopy 6. Osteoarthritis 7. Lumbar disc protrusion, followed by Dr. Carloyn Manner   Current medicines are reviewed at length with the patient today.  The patient does not have concerns regarding medicines.  The following changes have been made:  no change  Labs/ tests ordered today include:   Orders Placed This Encounter  Procedures  . TSH  . Lipid panel  . Hepatic function panel  . Basic metabolic panel  . CBC with Differential/Platelet  . Hemoglobin A1c  . T4, free     Disposition:  We are checking lab work today.  Following my retirement the patient will follow-up with a new PCP.  She expects to find her PCP in [facility] area.  Recheck here when necessary Signed, [name] MD 03/08/2015 1:05 PM    [address], [place], [place]  [id number] Phone: [phone]; Fax: [phone]

## 2015-03-08 NOTE — Patient Instructions (Signed)
Medication Instructions:  Your physician recommends that you continue on your current medications as directed. Please refer to the Current Medication list given to you today.  Labwork: Lp/bmet/hfp/cbc/tsh/ft4/a1c  Testing/Procedures: none  Follow-Up: As needed  If you need a refill on your cardiac medications before your next appointment, please call your pharmacy.

## 2015-03-10 NOTE — Progress Notes (Signed)
Quick Note:  Please report to patient. The recent labs are stable. Continue same medication and careful diet. Thyroid replacement is okay. ______

## 2015-03-11 ENCOUNTER — Other Ambulatory Visit: Payer: Self-pay | Admitting: Cardiology

## 2015-04-24 ENCOUNTER — Encounter: Payer: Self-pay | Admitting: *Deleted

## 2015-05-02 DIAGNOSIS — D692 Other nonthrombocytopenic purpura: Secondary | ICD-10-CM | POA: Diagnosis not present

## 2015-05-02 DIAGNOSIS — L821 Other seborrheic keratosis: Secondary | ICD-10-CM | POA: Diagnosis not present

## 2015-05-02 DIAGNOSIS — L814 Other melanin hyperpigmentation: Secondary | ICD-10-CM | POA: Diagnosis not present

## 2015-05-02 DIAGNOSIS — L570 Actinic keratosis: Secondary | ICD-10-CM | POA: Diagnosis not present

## 2015-05-02 DIAGNOSIS — Z85828 Personal history of other malignant neoplasm of skin: Secondary | ICD-10-CM | POA: Diagnosis not present

## 2015-05-02 DIAGNOSIS — D1801 Hemangioma of skin and subcutaneous tissue: Secondary | ICD-10-CM | POA: Diagnosis not present

## 2015-05-06 ENCOUNTER — Telehealth: Payer: Self-pay | Admitting: Cardiology

## 2015-05-06 NOTE — Telephone Encounter (Signed)
New message    Patient calling back need to make arraignments to get an xray.  Recent fall

## 2015-05-06 NOTE — Telephone Encounter (Signed)
Spoke with patient and she had a fall on Friday, not related to the weather Stated she fell on her chest/shoulder area and now it hurts from chest to back shoulder blade Patient was wanting to just get an Xray.  Advised patient  Dr. Mare Ferrari is at the hospital working and since she has not established PCP she needed to go to Urgent Care for evaluation. Verbalized understanding

## 2015-05-07 ENCOUNTER — Ambulatory Visit (INDEPENDENT_AMBULATORY_CARE_PROVIDER_SITE_OTHER): Payer: Medicare Other | Admitting: Internal Medicine

## 2015-05-07 ENCOUNTER — Ambulatory Visit (INDEPENDENT_AMBULATORY_CARE_PROVIDER_SITE_OTHER): Payer: Medicare Other

## 2015-05-07 VITALS — BP 160/80 | HR 84 | Temp 98.3°F | Resp 16 | Ht 61.0 in | Wt 129.0 lb

## 2015-05-07 DIAGNOSIS — S20212A Contusion of left front wall of thorax, initial encounter: Secondary | ICD-10-CM | POA: Diagnosis not present

## 2015-05-07 DIAGNOSIS — R0789 Other chest pain: Secondary | ICD-10-CM | POA: Diagnosis not present

## 2015-05-07 NOTE — Progress Notes (Signed)
Subjective:  By signing my name below, I, Rawaa Al Rifaie, attest that this documentation has been prepared under the direction and in the presence of Tami Lin, MD.  Royann Shivers Rifaie, Medical Scribe. 05/07/2015.  3:14 PM.   Patient ID: Samantha Quinn, female    DOB: 1927/11/09, 82 y.o.   MRN: [id number]  Chief Complaint  Patient presents with  . Chest Pain    left, fell Friday afternoon  . Back Pain    left    HPI HPI Comments: Samantha Quinn is a 82 y.o. female who [facility] complaining of left chest wall pain, onset 4 days ago.  Pt reports that she tripped on an object, fell forward and hit her left body side. She indicates that the pain is radiating to her back area thru L shoulder.. She indicates that the pain is most severe in the breast area. She denies any swelling of the area. Pt is s/p right upper lobe resection for incidental stage I lung cancer.  No sob/cough/palpit.   Patient Active Problem List   Diagnosis Date Noted  . Cough 12/12/2013  . Multinodular goiter (nontoxic) 09/29/2012  . Anemia 09/09/2011  . Pneumothorax after biopsy 07/09/2011    Class: Acute  . Primary adenocarcinoma of lung ([place]) 06/24/2011  . Abnormal chest x-ray 06/18/2011  . Benign hypertensive heart disease without heart failure 10/20/2010  . Hypercholesterolemia 10/20/2010  . Type II or unspecified type diabetes mellitus without mention of complication, uncontrolled 10/20/2010  . Hypothyroidism (acquired) 10/20/2010  . Osteoarthritis 10/20/2010     Allergies  Allergen Reactions  . Codeine Nausea And Vomiting  . Tramadol Other (See Comments)    syncope  . Demerol Nausea Only   Prior to Admission medications   Medication Sig Start Date End Date Taking? Authorizing Provider  amitriptyline (ELAVIL) 10 MG tablet Take 10 mg by mouth at bedtime.   Yes Historical Provider, MD  bisoprolol-hydrochlorothiazide [facility]) 2.5-6.25 MG tablet TAKE ONE TABLET BY  MOUTH ONCE DAILY 03/12/15  Yes [name], MD  clobetasol ointment (TEMOVATE) 3.26 % Apply 1 application topically daily as needed (for rash).  11/26/11  Yes Historical Provider, MD  ibuprofen (ADVIL,MOTRIN) 200 MG tablet Take 200 mg by mouth every 6 (six) hours as needed for fever, headache or mild pain.    Yes Historical Provider, MD  levothyroxine (SYNTHROID, LEVOTHROID) 100 MCG tablet Take 100 mcg by mouth daily before breakfast.   Yes Historical Provider, MD  lovastatin (MEVACOR) 20 MG tablet Take 20 mg by mouth every other day.   Yes Historical Provider, MD  meclizine (ANTIVERT) 25 MG tablet Take 25 mg by mouth 3 (three) times daily as needed for dizziness. 11/08/12  Yes [name], MD  metFORMIN (GLUCOPHAGE) 500 MG tablet Take 1 tablet (500 mg total) by mouth 2 (two) times daily with a meal. 12/24/14  Yes [name], MD  ramipril (ALTACE) 2.5 MG capsule TAKE ONE CAPSULE BY MOUTH ONCE DAILY 05/09/14  Yes [name], MD   PCP DR Mare Ferrari  Review of Systems  Musculoskeletal: Positive for myalgias, back pain and arthralgias. Negative for joint swelling.      Objective:   Physical Exam  Constitutional: She is oriented to person, place, and time. She appears well-developed and well-nourished. No distress.  HENT:  Head: Normocephalic and atraumatic.  Eyes: Conjunctivae and EOM are normal. Pupils are equal, round, and reactive to light.  Neck: Normal range of motion. Neck supple. No  thyromegaly present.  Cardiovascular: Normal rate, regular rhythm, normal heart sounds and intact distal pulses.   No murmur heard. Pulmonary/Chest: Effort normal and breath sounds normal. No respiratory distress.  Very tend to plap over L ant chest wall upper ribs above breast but without swell or ecchym L should has good rom L trapez tight and tender Tender L scap border and over spin proc t6-8    Musculoskeletal:  LS area clear  Lymphadenopathy:    She has no cervical adenopathy.    Neurological: She is alert and oriented to person, place, and time. No cranial nerve deficit.  Skin: Skin is warm and dry.  Psychiatric: She has a normal mood and affect. Her behavior is normal.  Nursing note and vitals reviewed.   [facility] (PRIMARY) x-ray report read by Dr. Tami Lin, MD: No signs of fracture or pulmonary lesion.  BP 160/80 mmHg  Pulse 84  Temp(Src) 98.3 F (36.8 C) (Oral)  Resp 16  Ht '5\' 1"'$  (1.549 m)  Wt 129 lb (58.514 kg)  BMI 24.39 kg/m2  SpO2 97%     Assessment & Plan:  Pain, chest wall - Plan: DG Ribs Unilateral W/Chest Left  Contusion, chest wall, left, initial encounter   Reassured!! Ok advil which she is using Heat Stretch Reck 1 w if not greatly improved I have completed the patient encounter in its entirety as documented by the scribe, with editing by me where necessary. Mathieu Schloemer P. Laney Pastor, M.D.

## 2015-05-09 DIAGNOSIS — G5712 Meralgia paresthetica, left lower limb: Secondary | ICD-10-CM | POA: Diagnosis not present

## 2015-05-09 DIAGNOSIS — M5126 Other intervertebral disc displacement, lumbar region: Secondary | ICD-10-CM | POA: Diagnosis not present

## 2015-05-09 DIAGNOSIS — M47816 Spondylosis without myelopathy or radiculopathy, lumbar region: Secondary | ICD-10-CM | POA: Diagnosis not present

## 2015-05-15 ENCOUNTER — Telehealth: Payer: Self-pay | Admitting: Cardiology

## 2015-05-15 NOTE — Telephone Encounter (Signed)
Spoke with patient and ok to stop Amitriptyline or use as needed, only takes 10 mg 1/2 at bedtime.  She has been off the Amitriptyline for several days already.  She was recently started on Gabapentin 100 mg x 1 week, then 100 mg twice a day x 1 week, then 100 mg three times a day. Increases will be made as tolerated. Discussed with  Dr. Mare Ferrari and ok to use Amitriptyline 10 mg 1/2 at bedtime as needed

## 2015-05-15 NOTE — Telephone Encounter (Signed)
New message      Pt c/o medication issue:  1. Name of Medication: amitriptyline 2. How are you currently taking this medication (dosage and times per day)?  '10mg'$  3. Are you having a reaction (difficulty breathing--STAT)?  4. What is your medication issue?  Talk to [facility] about this medication

## 2015-05-17 NOTE — Addendum Note (Signed)
Addended by: Alvina Filbert B on: 05/17/2015 04:58 PM   Modules accepted: Medications

## 2015-05-25 ENCOUNTER — Other Ambulatory Visit: Payer: Self-pay | Admitting: Cardiology

## 2015-05-25 DIAGNOSIS — G4700 Insomnia, unspecified: Secondary | ICD-10-CM

## 2015-05-30 ENCOUNTER — Other Ambulatory Visit: Payer: Self-pay

## 2015-06-27 DIAGNOSIS — B351 Tinea unguium: Secondary | ICD-10-CM | POA: Diagnosis not present

## 2015-06-27 DIAGNOSIS — Z85828 Personal history of other malignant neoplasm of skin: Secondary | ICD-10-CM | POA: Diagnosis not present

## 2015-06-27 DIAGNOSIS — L570 Actinic keratosis: Secondary | ICD-10-CM | POA: Diagnosis not present

## 2015-07-10 ENCOUNTER — Other Ambulatory Visit (INDEPENDENT_AMBULATORY_CARE_PROVIDER_SITE_OTHER): Payer: Medicare Other

## 2015-07-10 ENCOUNTER — Ambulatory Visit (INDEPENDENT_AMBULATORY_CARE_PROVIDER_SITE_OTHER)
Admission: RE | Admit: 2015-07-10 | Discharge: 2015-07-10 | Disposition: A | Discharge status: Home / Self Care | Payer: Medicare Other | Source: Ambulatory Visit | Attending: Internal Medicine | Admitting: Internal Medicine

## 2015-07-10 ENCOUNTER — Ambulatory Visit (INDEPENDENT_AMBULATORY_CARE_PROVIDER_SITE_OTHER): Payer: Medicare Other | Admitting: Internal Medicine

## 2015-07-10 ENCOUNTER — Encounter: Payer: Self-pay | Admitting: Internal Medicine

## 2015-07-10 VITALS — BP 150/82 | HR 72 | Temp 98.6°F | Resp 16 | Wt 128.0 lb

## 2015-07-10 DIAGNOSIS — G5712 Meralgia paresthetica, left lower limb: Secondary | ICD-10-CM

## 2015-07-10 DIAGNOSIS — E7800 Pure hypercholesterolemia, unspecified: Secondary | ICD-10-CM | POA: Diagnosis not present

## 2015-07-10 DIAGNOSIS — I10 Essential (primary) hypertension: Secondary | ICD-10-CM | POA: Diagnosis not present

## 2015-07-10 DIAGNOSIS — E039 Hypothyroidism, unspecified: Secondary | ICD-10-CM

## 2015-07-10 DIAGNOSIS — M810 Age-related osteoporosis without current pathological fracture: Secondary | ICD-10-CM

## 2015-07-10 DIAGNOSIS — E042 Nontoxic multinodular goiter: Secondary | ICD-10-CM

## 2015-07-10 DIAGNOSIS — F419 Anxiety disorder, unspecified: Secondary | ICD-10-CM | POA: Insufficient documentation

## 2015-07-10 DIAGNOSIS — E119 Type 2 diabetes mellitus without complications: Secondary | ICD-10-CM | POA: Diagnosis not present

## 2015-07-10 DIAGNOSIS — B351 Tinea unguium: Secondary | ICD-10-CM

## 2015-07-10 LAB — COMPREHENSIVE METABOLIC PANEL
ALBUMIN: 4.2 g/dL (ref 3.5–5.2)
ALT: 13 U/L (ref 0–35)
AST: 15 U/L (ref 0–37)
Alkaline Phosphatase: 70 U/L (ref 39–117)
BILIRUBIN TOTAL: 0.4 mg/dL (ref 0.2–1.2)
BUN: 18 mg/dL (ref 6–23)
CHLORIDE: 103 meq/L (ref 96–112)
CO2: 29 meq/L (ref 19–32)
Calcium: 9.4 mg/dL (ref 8.4–10.5)
Creatinine, Ser: 0.63 mg/dL (ref 0.40–1.20)
GFR: 95.35 mL/min (ref >60.00)
GLUCOSE: 107 mg/dL — AB (ref 70–99)
Potassium: 3.8 mEq/L (ref 3.5–5.1)
SODIUM: 139 meq/L (ref 135–145)
TOTAL PROTEIN: 7.2 g/dL (ref 6.0–8.3)

## 2015-07-10 LAB — HEMOGLOBIN A1C: Hgb A1c MFr Bld: 6.6 % — ABNORMAL HIGH (ref 4.6–6.5)

## 2015-07-10 MED ORDER — RAMIPRIL 2.5 MG PO CAPS: 2.5000 mg | ORAL_CAPSULE | Freq: Every day | ORAL | Status: DC

## 2015-07-10 MED ORDER — BISOPROLOL-HYDROCHLOROTHIAZIDE 2.5-6.25 MG PO TABS: 1.0000 | ORAL_TABLET | Freq: Every day | ORAL | Status: DC

## 2015-07-10 MED ORDER — LOVASTATIN 20 MG PO TABS: 20.0000 mg | ORAL_TABLET | ORAL | Status: DC

## 2015-07-10 MED ORDER — METFORMIN HCL 500 MG PO TABS: 500.0000 mg | ORAL_TABLET | Freq: Two times a day (BID) | ORAL | Status: DC

## 2015-07-10 MED ORDER — AMITRIPTYLINE HCL 10 MG PO TABS: 10.0000 mg | ORAL_TABLET | Freq: Every evening | ORAL | Status: DC | PRN

## 2015-07-10 MED ORDER — LEVOTHYROXINE SODIUM 100 MCG PO TABS: 100.0000 ug | ORAL_TABLET | Freq: Every day | ORAL | Status: DC

## 2015-07-10 NOTE — Progress Notes (Signed)
Subjective:    Patient ID: Samantha Quinn, female    DOB: 08-30-1927, 82 y.o.   MRN: [id number]  HPI She is here to establish with a new pcp.  She is here for follow up.   Hypertension: She is taking her medication daily. She is compliant with a low sodium diet.  She denies chest pain, palpitations,  and regular headaches. She has some shortness of breath with exertion, which started after her lung surgery. She is on chronic leg edema that is fairly controlled. She is not exercising regularly.  .    Hypothyroidism:  She is taking her medication daily.  She denies any recent changes in energy or weight that are unexplained. She does have a goiter and over the years she takes it has gotten slightly larger. She denies any history of thyroid cancer.  Hyperlipidemia: She is taking her medication daily. She is compliant with a low fat/cholesterol diet. She is not exercising regularly. She denies myalgias.   Diabetes: She is taking her medication daily as prescribed. She is compliant with a diabetic diet. She is exercising regularly. She monitors her sugars and they have been running XXX. She checks her feet daily and denies foot lesions. She is up-to-date with an ophthalmology examination.   Anxiety, difficulty sleeping;  She takes amitriptyline 5 mg at night.    Medications and allergies reviewed with patient and updated if appropriate.  Patient Active Problem List   Diagnosis Date Noted  . Cough 12/12/2013  . Multinodular goiter (nontoxic) 09/29/2012  . Anemia 09/09/2011  . Pneumothorax after biopsy 07/09/2011    Class: Acute  . Primary adenocarcinoma of lung ([place]) 06/24/2011  . Abnormal chest x-ray 06/18/2011  . Benign hypertensive heart disease without heart failure 10/20/2010  . Hypercholesterolemia 10/20/2010  . Type II or unspecified type diabetes mellitus without mention of complication, uncontrolled 10/20/2010  . Hypothyroidism (acquired) 10/20/2010  . Osteoarthritis 10/20/2010      Current Outpatient Prescriptions on File Prior to Visit  Medication Sig Dispense Refill  . amitriptyline (ELAVIL) 10 MG tablet Take 10 mg by mouth at bedtime as needed.     . bisoprolol-hydrochlorothiazide (ZIAC) 2.5-6.25 MG tablet TAKE ONE TABLET BY MOUTH ONCE DAILY 90 tablet 3  . clobetasol ointment (TEMOVATE) 7.59 % Apply 1 application topically daily as needed (for rash).     . gabapentin (NEURONTIN) 100 MG capsule Take 100 mg by mouth as directed. Take 1 capsule by mouth up to three times a day    . ibuprofen (ADVIL,MOTRIN) 200 MG tablet Take 200 mg by mouth every 6 (six) hours as needed for fever, headache or mild pain.     [name] levothyroxine (SYNTHROID, LEVOTHROID) 100 MCG tablet Take 100 mcg by mouth daily before breakfast. Takes every other day.    . lovastatin (MEVACOR) 20 MG tablet Take 20 mg by mouth every other day.    . meclizine (ANTIVERT) 25 MG tablet Take 25 mg by mouth 3 (three) times daily as needed for dizziness.    . metFORMIN (GLUCOPHAGE) 500 MG tablet Take 1 tablet (500 mg total) by mouth 2 (two) times daily with a meal. 180 tablet 3  . ramipril (ALTACE) 2.5 MG capsule TAKE ONE CAPSULE BY MOUTH ONCE DAILY 90 capsule 2   No current facility-administered medications on file prior to visit.    Past Medical History  Diagnosis Date  . Diabetes mellitus   . Hyperlipidemia     takes Lovastatin every other day  .  Hypercholesterolemia   . Dyslipidemia   . Myalgia   . Primary adenocarcinoma of lung ([place]) 06/24/2011  . PONV (postoperative nausea and vomiting)   . Osteoarthritis   . Pneumothorax of right lung after biopsy 07/09/11    HAD CHEST TUBE PLACEMENT  . Hypertension     takes Ziac daily and Ramipril as well  . Hypothyroidism     takes Synthroid every other day  . Embolism - blood clot     hx of in left lower leg and was on Coumadin for about 10month;this was about 8-159yrago  . Lung mass   . Bronchitis     hx of-many,many years   . Upper respiratory  infection 04/2011  . Hx of migraines     as a teenager  . Back pain     bulding disc  . Osteoporosis   . Joint pain   . Skin cancer     legs have dark spots on them  . History of colonic polyps   . Urinary incontinence     takes Detrol daily prn  . Diabetes mellitus     type 2 and takes Metformin bid  . Insomnia     takes Elavil nightly    Past Surgical History  Procedure Laterality Date  . Cholecystectomy    . Hemorrhoid surgery    . Colonoscopy    . Lung biopsy  07/09/11    RIGHT UPER LOBE LUNG MASS   . Right chest tube placement  07/10/11    S/P LUNG BX  . Chest tube removal  07/13/11    RIGHT  CHEST TUBE  . Tonsillectomy      as a child  . Appendectomy      as a child  . Dilation and curettage of uterus      x 2  . Abdominal hysterectomy    . Rotator cuff repair      bilateral  . Toe surgery  2013    right small toe  . Colonoscopy    . Video bronchoscopy  07/17/2011    Procedure: VIDEO BRONCHOSCOPY;  Surgeon: EdGrace IsaacMD;  [facility];  Service: Thoracic;  Laterality: N/A;    Social History   Social History  . Marital Status: Widowed    Spouse Name: N/A  . Number of Children: N/A  . Years of Education: N/A   Social History Main Topics  . Smoking status: Never Smoker   . Smokeless tobacco: Never Used  . Alcohol Use: No  . Drug Use: No  . Sexual Activity: Yes    Birth Control/ Protection: Surgical   Other Topics Concern  . Not on file   Social History Narrative    Family History  Problem Relation Age of Onset  . Arthritis Sister   . Arthritis Sister   . Diabetes Brother   . Hypertension Mother   . Anesthesia problems Neg Hx   . Hypotension Neg Hx   . Malignant hyperthermia Neg Hx   . Pseudochol deficiency Neg Hx   . Heart attack Neg Hx   . Hypertension Father   . Hypertension Sister     Review of Systems  Constitutional: Negative for fever, chills and appetite change.  HENT: Positive for trouble swallowing (Occasional).    Respiratory: Positive for cough (allergy related - improved with allegra) and shortness of breath (with exertion since lung surgery). Negative for wheezing.   Cardiovascular: Positive for leg swelling (mild, occasionally). Negative for chest pain  and palpitations.  Gastrointestinal:       Occasional gerd -takes 1/2 tum  Musculoskeletal: Positive for back pain (chronic lower back pain).  Neurological: Positive for dizziness (occasionally) and headaches (rare). Negative for light-headedness.       Objective:   Filed Vitals:   07/10/15 1532  BP: 150/82  Pulse: 72  Temp: 98.6 F (37 C)  Resp: 16   Filed Weights   07/10/15 1532  Weight: 128 lb (58.06 kg)   Body mass index is 24.2 kg/(m^2).   Physical Exam Constitutional: Appears well-developed and well-nourished. No distress.  Neck: Neck supple. No tracheal deviation present. Right-sided goiter/thyromegaly present.  No carotid bruit. No cervical adenopathy.   Cardiovascular: Normal rate, regular rhythm and normal heart sounds.   No murmur heard.  trace edema Pulmonary/Chest: Effort normal and breath sounds normal. No respiratory distress. No wheezes.        Assessment & Plan:   See Problem List for Assessment and Plan of chronic medical problems.  Follow-up in 4 months

## 2015-07-10 NOTE — Assessment & Plan Note (Signed)
Recheck TSH Her previous PCP has kept her thyroid suppressed to prevent additional growth of the goiter, but I'm worried about her osteoporosis May need to decrease medication dose Clinically euthyroid

## 2015-07-10 NOTE — Assessment & Plan Note (Addendum)
Taking gabapentin 100 mg three times a day Following with neurosurgery

## 2015-07-10 NOTE — Assessment & Plan Note (Signed)
Well-controlled Continue current dose of metformin Check A1c

## 2015-07-10 NOTE — Assessment & Plan Note (Signed)
Taking lovastatin daily Lipid panel has been well controlled so we will not repeat at this visit

## 2015-07-10 NOTE — Assessment & Plan Note (Signed)
Toenails, followed by dermatology Considering oral Lamisil-would need to hold lovastatin

## 2015-07-10 NOTE — Patient Instructions (Addendum)
You will have a bone density.    Test(s) ordered today. Your results will be released to [facility] (or called to you) after review, usually within 72hours after test completion. If any changes need to be made, you will be notified at that same time.   Medications reviewed and updated.  No changes recommended at this time.  Your prescription(s) have been submitted to your pharmacy. Please take as directed and contact our office if you believe you are having problem(s) with the medication(s).   Please followup in 4 months

## 2015-07-10 NOTE — Assessment & Plan Note (Signed)
Elevated here today, but typically well controlled Continue current medication

## 2015-07-10 NOTE — Progress Notes (Signed)
Pre visit review using our clinic review tool, if applicable. No additional management support is needed unless otherwise documented below in the visit note. 

## 2015-07-10 NOTE — Assessment & Plan Note (Signed)
Was on Actonel in the past-? yearsCurrently not taking any calcium or vitamin D Currently not exercising Last DEXA 2011 showed osteoporosis Repeat DEXA

## 2015-07-10 NOTE — Assessment & Plan Note (Signed)
Mild anxiety that sometimes causes difficulty sleeping Has been taking amitriptyline 5 mg nightly for years, which is effective Continue same

## 2015-07-10 NOTE — Assessment & Plan Note (Signed)
Had a thyroid biopsy which was negative She has felt some growth of the goiter over the past several years Taking levothyroxine-her previous PCP has been keeping her thyroid somewhat suppressed to avoid growth of the goiter She states occasional dysphagia with steak, but I do not feel this is related to the goiter since it is infrequent

## 2015-07-11 LAB — T3, FREE: T3, Free: 4.3 pg/mL — ABNORMAL HIGH (ref 2.3–4.2)

## 2015-07-11 LAB — TSH: TSH: 0.01 u[IU]/mL — ABNORMAL LOW (ref 0.35–4.50)

## 2015-07-11 LAB — T4, FREE: Free T4: 1.47 ng/dL (ref 0.60–1.60)

## 2015-07-12 ENCOUNTER — Ambulatory Visit: Payer: Medicare Other | Admitting: Internal Medicine

## 2015-07-13 LAB — VITAMIN D 1,25 DIHYDROXY
VITAMIN D3 1, 25 (OH): 58 pg/mL
Vitamin D 1, 25 (OH)2 Total: 58 pg/mL (ref 18–72)

## 2015-07-14 ENCOUNTER — Other Ambulatory Visit: Payer: Self-pay | Admitting: Internal Medicine

## 2015-07-14 DIAGNOSIS — E039 Hypothyroidism, unspecified: Secondary | ICD-10-CM

## 2015-07-14 MED ORDER — LEVOTHYROXINE SODIUM 88 MCG PO TABS: 88.0000 ug | ORAL_TABLET | Freq: Every day | ORAL | Status: DC

## 2015-07-15 ENCOUNTER — Encounter: Payer: Self-pay | Admitting: Emergency Medicine

## 2015-07-31 DIAGNOSIS — H25012 Cortical age-related cataract, left eye: Secondary | ICD-10-CM | POA: Diagnosis not present

## 2015-07-31 DIAGNOSIS — H5703 Miosis: Secondary | ICD-10-CM | POA: Diagnosis not present

## 2015-07-31 DIAGNOSIS — H353112 Nonexudative age-related macular degeneration, right eye, intermediate dry stage: Secondary | ICD-10-CM | POA: Diagnosis not present

## 2015-07-31 DIAGNOSIS — E119 Type 2 diabetes mellitus without complications: Secondary | ICD-10-CM | POA: Diagnosis not present

## 2015-07-31 DIAGNOSIS — H353130 Nonexudative age-related macular degeneration, bilateral, stage unspecified: Secondary | ICD-10-CM | POA: Diagnosis not present

## 2015-07-31 DIAGNOSIS — H2512 Age-related nuclear cataract, left eye: Secondary | ICD-10-CM | POA: Diagnosis not present

## 2015-07-31 DIAGNOSIS — H1851 Endothelial corneal dystrophy: Secondary | ICD-10-CM | POA: Diagnosis not present

## 2015-07-31 DIAGNOSIS — H353122 Nonexudative age-related macular degeneration, left eye, intermediate dry stage: Secondary | ICD-10-CM | POA: Diagnosis not present

## 2015-07-31 LAB — HM DIABETES EYE EXAM

## 2015-08-07 ENCOUNTER — Encounter: Payer: Self-pay | Admitting: Internal Medicine

## 2015-08-08 DIAGNOSIS — M5126 Other intervertebral disc displacement, lumbar region: Secondary | ICD-10-CM | POA: Diagnosis not present

## 2015-08-08 DIAGNOSIS — M47816 Spondylosis without myelopathy or radiculopathy, lumbar region: Secondary | ICD-10-CM | POA: Diagnosis not present

## 2015-08-08 DIAGNOSIS — M7060 Trochanteric bursitis, unspecified hip: Secondary | ICD-10-CM | POA: Diagnosis not present

## 2015-08-08 DIAGNOSIS — M259 Joint disorder, unspecified: Secondary | ICD-10-CM | POA: Diagnosis not present

## 2015-08-13 DIAGNOSIS — Z79899 Other long term (current) drug therapy: Secondary | ICD-10-CM | POA: Diagnosis not present

## 2015-09-10 DIAGNOSIS — H2512 Age-related nuclear cataract, left eye: Secondary | ICD-10-CM | POA: Diagnosis not present

## 2015-09-30 ENCOUNTER — Telehealth: Payer: Self-pay | Admitting: Internal Medicine

## 2015-09-30 NOTE — Telephone Encounter (Signed)
Samantha Quinn - can you see if she get get prolia.   Samantha Quinn - please call her and let her know we will be in touch to set up prolia

## 2015-09-30 NOTE — Telephone Encounter (Signed)
Spoke with pt. She states that she was seen by neuro surgery after her last OV. States that she gave him the results from the Bone Density and he would like her to have the Prolia injections. Explained the process for prolia to the pt. She states that she is currently taking the Calcium and Vit D but was not before the Bone density. Please advise what you would like done.

## 2015-09-30 NOTE — Telephone Encounter (Signed)
Pt request to speak to the assistant concern about injection for osteoporosis. Please call her back

## 2015-10-01 NOTE — Telephone Encounter (Signed)
LVM informing pt, MD okay with Prolia injections and she will be contacted by Jonelle Sidle once cleared through her insurance comp.

## 2015-10-01 NOTE — Telephone Encounter (Signed)
Sending patients information into prolia portal for insurance verification---will advise patient and dr burns when i get results

## 2015-10-03 DIAGNOSIS — Z85828 Personal history of other malignant neoplasm of skin: Secondary | ICD-10-CM | POA: Diagnosis not present

## 2015-10-03 DIAGNOSIS — L821 Other seborrheic keratosis: Secondary | ICD-10-CM | POA: Diagnosis not present

## 2015-10-03 DIAGNOSIS — D485 Neoplasm of uncertain behavior of skin: Secondary | ICD-10-CM | POA: Diagnosis not present

## 2015-10-03 DIAGNOSIS — L570 Actinic keratosis: Secondary | ICD-10-CM | POA: Diagnosis not present

## 2015-10-03 DIAGNOSIS — I8391 Asymptomatic varicose veins of right lower extremity: Secondary | ICD-10-CM | POA: Diagnosis not present

## 2015-10-03 DIAGNOSIS — B351 Tinea unguium: Secondary | ICD-10-CM | POA: Diagnosis not present

## 2015-10-10 DIAGNOSIS — H25011 Cortical age-related cataract, right eye: Secondary | ICD-10-CM | POA: Diagnosis not present

## 2015-10-10 DIAGNOSIS — H2511 Age-related nuclear cataract, right eye: Secondary | ICD-10-CM | POA: Diagnosis not present

## 2015-10-23 ENCOUNTER — Other Ambulatory Visit: Payer: Self-pay | Admitting: *Deleted

## 2015-10-23 DIAGNOSIS — C3490 Malignant neoplasm of unspecified part of unspecified bronchus or lung: Secondary | ICD-10-CM

## 2015-11-08 DIAGNOSIS — M1990 Unspecified osteoarthritis, unspecified site: Secondary | ICD-10-CM | POA: Diagnosis not present

## 2015-11-08 DIAGNOSIS — M5126 Other intervertebral disc displacement, lumbar region: Secondary | ICD-10-CM | POA: Diagnosis not present

## 2015-11-08 DIAGNOSIS — M47816 Spondylosis without myelopathy or radiculopathy, lumbar region: Secondary | ICD-10-CM | POA: Diagnosis not present

## 2015-11-12 ENCOUNTER — Ambulatory Visit (INDEPENDENT_AMBULATORY_CARE_PROVIDER_SITE_OTHER): Payer: Medicare Other | Admitting: Internal Medicine

## 2015-11-12 ENCOUNTER — Other Ambulatory Visit (INDEPENDENT_AMBULATORY_CARE_PROVIDER_SITE_OTHER): Payer: Medicare Other

## 2015-11-12 ENCOUNTER — Encounter: Payer: Self-pay | Admitting: Internal Medicine

## 2015-11-12 VITALS — BP 134/82 | HR 66 | Temp 98.1°F | Resp 16 | Wt 123.0 lb

## 2015-11-12 DIAGNOSIS — E7800 Pure hypercholesterolemia, unspecified: Secondary | ICD-10-CM

## 2015-11-12 DIAGNOSIS — E039 Hypothyroidism, unspecified: Secondary | ICD-10-CM

## 2015-11-12 DIAGNOSIS — M810 Age-related osteoporosis without current pathological fracture: Secondary | ICD-10-CM | POA: Diagnosis not present

## 2015-11-12 DIAGNOSIS — I10 Essential (primary) hypertension: Secondary | ICD-10-CM | POA: Diagnosis not present

## 2015-11-12 DIAGNOSIS — E119 Type 2 diabetes mellitus without complications: Secondary | ICD-10-CM

## 2015-11-12 DIAGNOSIS — E042 Nontoxic multinodular goiter: Secondary | ICD-10-CM

## 2015-11-12 LAB — COMPREHENSIVE METABOLIC PANEL
ALBUMIN: 4.2 g/dL (ref 3.5–5.2)
ALK PHOS: 63 U/L (ref 39–117)
ALT: 13 U/L (ref 0–35)
AST: 14 U/L (ref 0–37)
BILIRUBIN TOTAL: 0.4 mg/dL (ref 0.2–1.2)
BUN: 21 mg/dL (ref 6–23)
CALCIUM: 9.6 mg/dL (ref 8.4–10.5)
CO2: 30 mEq/L (ref 19–32)
Chloride: 102 mEq/L (ref 96–112)
Creatinine, Ser: 0.63 mg/dL (ref 0.40–1.20)
GFR: 95.27 mL/min (ref >60.00)
GLUCOSE: 120 mg/dL — AB (ref 70–99)
Potassium: 4 mEq/L (ref 3.5–5.1)
Sodium: 137 mEq/L (ref 135–145)
TOTAL PROTEIN: 7.1 g/dL (ref 6.0–8.3)

## 2015-11-12 LAB — LIPID PANEL
CHOLESTEROL: 179 mg/dL (ref 0–200)
HDL: 50.2 mg/dL (ref >39.00)
LDL CALC: 93 mg/dL (ref 0–99)
NonHDL: 128.64
TRIGLYCERIDES: 180 mg/dL — AB (ref 0.0–149.0)
Total CHOL/HDL Ratio: 4
VLDL: 36 mg/dL (ref 0.0–40.0)

## 2015-11-12 LAB — T3, FREE: T3 FREE: 3.5 pg/mL (ref 2.3–4.2)

## 2015-11-12 LAB — TSH: TSH: 0.01 u[IU]/mL — ABNORMAL LOW (ref 0.35–4.50)

## 2015-11-12 LAB — HEMOGLOBIN A1C: Hgb A1c MFr Bld: 6.3 % (ref 4.6–6.5)

## 2015-11-12 LAB — VITAMIN D 25 HYDROXY (VIT D DEFICIENCY, FRACTURES): VITD: 38.34 ng/mL (ref 30.00–100.00)

## 2015-11-12 LAB — T4, FREE: FREE T4: 1.32 ng/dL (ref 0.60–1.60)

## 2015-11-12 MED ORDER — LEVOTHYROXINE SODIUM 88 MCG PO TABS
88.0000 ug | ORAL_TABLET | ORAL | Status: DC
Start: 2015-11-12 — End: 2015-11-15

## 2015-11-12 NOTE — Progress Notes (Signed)
Subjective:    Patient ID: Samantha Quinn, female    DOB: 07-21-1927, 82 y.o.   MRN: [id number]  HPI She is here for follow up.  Hypertension: She is taking her medication daily. She is compliant with a low sodium diet.  She denies chest pain, palpitations, and regular headaches. She has had some foot and ankle swelling recently.  She is not exercising regularly.  She does not monitor her blood pressure at home.    Hypothyroidism:  She is taking her medication daily.  She denies any recent changes in energy or weight that are unexplained.   Diabetes: She is taking her medication daily as prescribed. She is compliant with a diabetic diet. She is not exercising regularly. She checks her feet daily and denies foot lesions. She is up-to-date with an ophthalmology examination.   Hyperlipidemia: She is taking her medication daily. She is compliant with a low fat/cholesterol diet. She is not exercising regularly. She denies myalgias.   OP:  She is taking calcium and vitamin d daily.  She is not exercising regularly.  She is interested in prolia.    Medications and allergies reviewed with patient and updated if appropriate.  Patient Active Problem List   Diagnosis Date Noted  . Meralgia paresthetica of left side 07/10/2015  . Osteoporosis 07/10/2015  . Onychomycosis 07/10/2015  . Anxiety 07/10/2015  . Diabetes ([place]) 07/10/2015  . Cough 12/12/2013  . Multinodular goiter (nontoxic) 09/29/2012  . Pneumothorax after biopsy 07/09/2011    Class: Acute  . Primary adenocarcinoma of lung ([place]) 06/24/2011  . Hypercholesterolemia 10/20/2010  . Essential hypertension, benign 10/20/2010  . Hypothyroidism (acquired) 10/20/2010  . Osteoarthritis 10/20/2010    Current Outpatient Prescriptions on File Prior to Visit  Medication Sig Dispense Refill  . amitriptyline (ELAVIL) 10 MG tablet Take 1 tablet (10 mg total) by mouth at bedtime as needed. 90 tablet 1  . bisoprolol-hydrochlorothiazide (ZIAC)  2.5-6.25 MG tablet Take 1 tablet by mouth daily. 90 tablet 3  . gabapentin (NEURONTIN) 100 MG capsule Take 200 mg by mouth 3 (three) times daily. Take 1 capsule by mouth up to three times a day    . ibuprofen (ADVIL,MOTRIN) 200 MG tablet Take 200 mg by mouth every 6 (six) hours as needed for fever, headache or mild pain.     [name] levothyroxine (SYNTHROID, LEVOTHROID) 88 MCG tablet Take 1 tablet (88 mcg total) by mouth daily. 90 tablet 3  . lovastatin (MEVACOR) 20 MG tablet Take 1 tablet (20 mg total) by mouth every other day. 90 tablet 1  . meclizine (ANTIVERT) 25 MG tablet Take 25 mg by mouth 3 (three) times daily as needed for dizziness.    . metFORMIN (GLUCOPHAGE) 500 MG tablet Take 1 tablet (500 mg total) by mouth 2 (two) times daily with a meal. 180 tablet 3  . ramipril (ALTACE) 2.5 MG capsule Take 1 capsule (2.5 mg total) by mouth daily. 90 capsule 2   No current facility-administered medications on file prior to visit.    Past Medical History  Diagnosis Date  . Diabetes mellitus   . Hyperlipidemia     takes Lovastatin every other day  . Hypercholesterolemia   . Dyslipidemia   . Myalgia   . Primary adenocarcinoma of lung ([place]) 06/24/2011  . PONV (postoperative nausea and vomiting)   . Osteoarthritis   . Pneumothorax of right lung after biopsy 07/09/11    HAD CHEST TUBE PLACEMENT  . Hypertension     takes  Ziac daily and Ramipril as well  . Hypothyroidism     takes Synthroid every other day  . Embolism - blood clot     hx of in left lower leg and was on Coumadin for about 10month;this was about 8-142yrago  . Lung mass   . Bronchitis     hx of-many,many years   . Upper respiratory infection 04/2011  . Hx of migraines     as a teenager  . Back pain     bulding disc  . Osteoporosis   . Joint pain   . Skin cancer     legs have dark spots on them  . History of colonic polyps   . Urinary incontinence     takes Detrol daily prn  . Diabetes mellitus     type 2 and takes  Metformin bid  . Insomnia     takes Elavil nightly    Past Surgical History  Procedure Laterality Date  . Cholecystectomy    . Hemorrhoid surgery    . Colonoscopy    . Lung biopsy  07/09/11    RIGHT UPER LOBE LUNG MASS   . Right chest tube placement  07/10/11    S/P LUNG BX  . Chest tube removal  07/13/11    RIGHT  CHEST TUBE  . Tonsillectomy      as a child  . Appendectomy      as a child  . Dilation and curettage of uterus      x 2  . Abdominal hysterectomy    . Rotator cuff repair      bilateral  . Toe surgery  2013    right small toe  . Colonoscopy    . Video bronchoscopy  07/17/2011    Procedure: VIDEO BRONCHOSCOPY;  Surgeon: EdGrace IsaacMD;  [facility];  Service: Thoracic;  Laterality: N/A;    Social History   Social History  . Marital Status: Widowed    Spouse Name: N/A  . Number of Children: N/A  . Years of Education: N/A   Social History Main Topics  . Smoking status: Never Smoker   . Smokeless tobacco: Never Used  . Alcohol Use: No  . Drug Use: No  . Sexual Activity: Yes    Birth Control/ Protection: Surgical   Other Topics Concern  . None   Social History Narrative    Family History  Problem Relation Age of Onset  . Arthritis Sister   . Arthritis Sister   . Diabetes Brother   . Hypertension Mother   . Anesthesia problems Neg Hx   . Hypotension Neg Hx   . Malignant hyperthermia Neg Hx   . Pseudochol deficiency Neg Hx   . Heart attack Neg Hx   . Hypertension Father   . Hypertension Sister     Review of Systems  Constitutional: Negative for fever and appetite change.  Respiratory: Positive for cough (related to allergies) and shortness of breath (with walking long distances - chronic and unchanged). Negative for wheezing.   Cardiovascular: Positive for leg swelling (1-2 months, as day progresses, none in am). Negative for chest pain and palpitations.  Gastrointestinal: Negative for nausea and abdominal pain.  Neurological:  Negative for dizziness, light-headedness and headaches.       Objective:   Filed Vitals:   11/12/15 1030  BP: 134/82  Pulse: 66  Temp: 98.1 F (36.7 C)  Resp: 16   Filed Weights   11/12/15 1030  Weight: 123  lb (55.792 kg)   Body mass index is 23.25 kg/(m^2).   Physical Exam Constitutional: Appears well-developed and well-nourished. No distress.  Neck: Neck supple. No tracheal deviation present. thyromegaly present, nodule palpable in right lower lobe.  No carotid bruit. No cervical adenopathy.   Cardiovascular: Normal rate, regular rhythm and normal heart sounds.   No murmur heard.  mild edema Pulmonary/Chest: Effort normal and breath sounds normal. No respiratory distress. No wheezes.       Assessment & Plan:   See Problem List for Assessment and Plan of chronic medical problems.  F/u 4 months

## 2015-11-12 NOTE — Patient Instructions (Addendum)
  Test(s) ordered today. Your results will be released to [facility] (or called to you) after review, usually within 72hours after test completion. If any changes need to be made, you will be notified at that same time.  All other Health Maintenance issues reviewed.   All recommended immunizations and age-appropriate screenings are up-to-date or discussed.  No immunizations administered today.   Medications reviewed and updated.  No changes recommended at this time.   Please followup in 4 months

## 2015-11-12 NOTE — Assessment & Plan Note (Signed)
Back on lovastatin since stopping lamisil Check lipid, cmp

## 2015-11-12 NOTE — Progress Notes (Signed)
Pre visit review using our clinic review tool, if applicable. No additional management support is needed unless otherwise documented below in the visit note. 

## 2015-11-12 NOTE — Assessment & Plan Note (Signed)
Check a1c Has been well controlled Continue metformin

## 2015-11-12 NOTE — Assessment & Plan Note (Signed)
Reviewed dexa Calcium and vitamin d daily - will recheck vitamin d - has increased level - concern for taking too much Encouraged exercise, but limited by back pain Reviewed prolia - if covered she would like to start it

## 2015-11-12 NOTE — Assessment & Plan Note (Addendum)
Taking 88 mcg every other day Check tsh  Titrate med dose if needed

## 2015-11-12 NOTE — Assessment & Plan Note (Signed)
BP well controlled Current regimen effective and well tolerated Continue current medications at current doses cmp  

## 2015-11-13 ENCOUNTER — Telehealth: Payer: Self-pay

## 2015-11-13 NOTE — Telephone Encounter (Signed)
Left message advising patient that she qualifies for prolia injections recommended by dr burns, insurance has been verified and patient has estimated $0 copay---can schedule nurse visit at patient's earliest convenience---can talk with tamara if any questions---also routing to dr burns, fyi.Marland KitchenMarland Kitchen

## 2015-11-15 ENCOUNTER — Other Ambulatory Visit: Payer: Self-pay | Admitting: Internal Medicine

## 2015-11-15 DIAGNOSIS — E038 Other specified hypothyroidism: Secondary | ICD-10-CM

## 2015-11-15 MED ORDER — LEVOTHYROXINE SODIUM 25 MCG PO TABS: 25.0000 ug | ORAL_TABLET | Freq: Every day | ORAL | Status: DC

## 2015-11-19 ENCOUNTER — Ambulatory Visit: Payer: Medicare Other | Admitting: Cardiothoracic Surgery

## 2015-11-19 ENCOUNTER — Other Ambulatory Visit: Payer: Medicare Other

## 2015-11-19 DIAGNOSIS — H25011 Cortical age-related cataract, right eye: Secondary | ICD-10-CM | POA: Diagnosis not present

## 2015-11-19 DIAGNOSIS — H25811 Combined forms of age-related cataract, right eye: Secondary | ICD-10-CM | POA: Diagnosis not present

## 2015-11-19 DIAGNOSIS — H2511 Age-related nuclear cataract, right eye: Secondary | ICD-10-CM | POA: Diagnosis not present

## 2015-11-28 ENCOUNTER — Ambulatory Visit (INDEPENDENT_AMBULATORY_CARE_PROVIDER_SITE_OTHER): Payer: Medicare Other | Admitting: Cardiothoracic Surgery

## 2015-11-28 ENCOUNTER — Encounter: Payer: Self-pay | Admitting: Cardiothoracic Surgery

## 2015-11-28 ENCOUNTER — Ambulatory Visit
Admission: RE | Admit: 2015-11-28 | Discharge: 2015-11-28 | Disposition: A | Discharge status: Home / Self Care | Payer: Medicare Other | Source: Ambulatory Visit | Attending: Cardiothoracic Surgery | Admitting: Cardiothoracic Surgery

## 2015-11-28 ENCOUNTER — Other Ambulatory Visit: Payer: Self-pay | Admitting: *Deleted

## 2015-11-28 VITALS — BP 135/72 | HR 74 | Resp 20 | Ht 61.0 in | Wt 123.0 lb

## 2015-11-28 DIAGNOSIS — C3412 Malignant neoplasm of upper lobe, left bronchus or lung: Secondary | ICD-10-CM | POA: Diagnosis not present

## 2015-11-28 DIAGNOSIS — R911 Solitary pulmonary nodule: Secondary | ICD-10-CM

## 2015-11-28 DIAGNOSIS — C3490 Malignant neoplasm of unspecified part of unspecified bronchus or lung: Secondary | ICD-10-CM

## 2015-11-28 DIAGNOSIS — J984 Other disorders of lung: Secondary | ICD-10-CM | POA: Diagnosis not present

## 2015-11-28 DIAGNOSIS — Z902 Acquired absence of lung [part of]: Secondary | ICD-10-CM | POA: Diagnosis not present

## 2015-11-28 DIAGNOSIS — C3431 Malignant neoplasm of lower lobe, right bronchus or lung: Secondary | ICD-10-CM | POA: Diagnosis not present

## 2015-11-28 DIAGNOSIS — R918 Other nonspecific abnormal finding of lung field: Secondary | ICD-10-CM

## 2015-11-28 NOTE — Progress Notes (Signed)
[address]       [place] [address]             [phone]                       Samantha Quinn [facility] Medical Record #[id number] Date of Birth: 02-Aug-1927  [name], MD  Chief Complaint:   PostOp Follow Up Visit 07/17/2011  OPERATIVE REPORT  PREOPERATIVE DIAGNOSIS: Non-small-cell lung cancer, right upper lobe.  POSTOPERATIVE DIAGNOSIS: Non-small-cell lung cancer, right upper lobe.  SURGICAL PROCEDURES: Bronchoscopy, right video-assisted thoracoscopy,  right upper lobectomy with node dissection, and placement of On-Q  device.   p T2a, pN0 M0  3.5 cmwell-differentiated adenocarcinoma of the right upper lobe resected 07/17/2011 ALK & EGFR negative   History of Present Illness:     Patient is an 82 year old female who underwent surgical resection of a 3.5 cm invasive well differentiated adenocarcinoma in the right upper lobe on 07/17/2011. She denies any hemoptysis or recurrent pulmonary infections . Currently she is primarily limited by chronic back pain  And left leg pain.     History  Smoking Status  . Never Smoker  Smokeless Tobacco  . Never Used       Allergies  Allergen Reactions  . Codeine Nausea And Vomiting  . Tramadol Other (See Comments)    syncope  . Demerol Nausea Only    Current Outpatient Prescriptions  Medication Sig Dispense Refill  . amitriptyline (ELAVIL) 10 MG tablet Take 1 tablet (10 mg total) by mouth at bedtime as needed. 90 tablet 1  . bisoprolol-hydrochlorothiazide (ZIAC) 2.5-6.25 MG tablet Take 1 tablet by mouth daily. 90 tablet 3  . Calcium Carbonate-Vit D-Min (CALCIUM-VITAMIN D-MINERALS) 600-800 MG-UNIT CHEW Chew by mouth daily.    [name] gabapentin (NEURONTIN) 100 MG capsule Take 200 mg by mouth 3 (three) times daily. Take 1 capsule by mouth up to three times a day    . ibuprofen (ADVIL,MOTRIN) 200 MG tablet Take 200 mg by mouth every 6 (six) hours as needed for fever, headache or  mild pain.     [name] levothyroxine (SYNTHROID, LEVOTHROID) 25 MCG tablet Take 1 tablet (25 mcg total) by mouth daily before breakfast. 90 tablet 1  . lovastatin (MEVACOR) 20 MG tablet Take 1 tablet (20 mg total) by mouth every other day. 90 tablet 1  . meclizine (ANTIVERT) 25 MG tablet Take 25 mg by mouth 3 (three) times daily as needed for dizziness.    . metFORMIN (GLUCOPHAGE) 500 MG tablet Take 1 tablet (500 mg total) by mouth 2 (two) times daily with a meal. 180 tablet 3  . ramipril (ALTACE) 2.5 MG capsule Take 1 capsule (2.5 mg total) by mouth daily. 90 capsule 2   No current facility-administered medications for this visit.        Physical Exam: BP 135/72 (BP Location: Left Arm, Patient Position: Sitting, Cuff Size: Normal)   Pulse 74   Resp 20   Ht _0  (1.549 m)   Wt 123 lb (55.8 kg)   SpO2 98% Comment: RA  BMI 23.24 kg/m   General appearance: alert and cooperative Neurologic: intact Neck: The right thyroid lobe is slightly more prominent than the left. Heart: regular rate and rhythm, S1, S2 normal, no murmur, click, rub or gallop and normal apical impulse Lungs: clear to auscultation bilaterally  and normal percussion bilaterally Abdomen: soft, non-tender; bowel sounds normal; no masses,  no organomegaly Extremities: extremities normal, atraumatic, no cyanosis or edema Wound:  I do not appreciate any cervical or supraclavicular adenopathy, she does have a probable thyroid particularly the right lobe.  Diagnostic Studies & Laboratory data:         Recent Radiology Findings: Ct Chest Wo Contrast  Result Date: 11/28/2015 CLINICAL DATA:  Restaging lung cancer. EXAM: CT CHEST WITHOUT CONTRAST TECHNIQUE: Multidetector CT imaging of the chest was performed following the standard protocol without IV contrast. COMPARISON:  Chest CT 11/08/2014 FINDINGS: Chest wall: No breast masses, supraclavicular or axillary lymphadenopathy. Stable right-sided thyroid goiter with multiple nodules.  Cardiovascular: The heart is normal in size. No pericardial effusion. Stable tortuosity, ectasia and calcification of the thoracic aorta. No focal aneurysm. Mediastinum/Nodes: Small scattered mediastinal and hilar lymph nodes. No mass or overt adenopathy. Lungs/Pleura: New soft tissue mass is noted along the surgical suture line in the right lower lobe. This measures 28 x 20 mm and is consistent with tumor recurrence. There is also suspected tumor in the right lower lobe best seen on image 67 series 4. This measures 20 x 15.5 mm. This continues down along the right heart border with a soft tissue mass with maximum diameter of 9 mm which is a definite change since the prior examination. This is also worrisome for tumor. PET-CT and biopsy is suggested. The remainder of the lungs are clear. I do not see any findings for metastatic pulmonary nodules. No acute pulmonary findings. Stable pleural scarring changes in the right lower lobe. Upper Abdomen: Stable rim calcified splenic cyst. No adrenal gland lesions or hepatic lesions. Musculoskeletal: No significant bony findings. IMPRESSION: 1. New soft tissue masses in the right lower lobe as described above. Findings worrisome for recurrent tumor. Recommend PET-CT and biopsy. 2. No mediastinal or hilar mass or adenopathy. 3. No findings for pulmonary or upper abdominal metastatic disease. Electronically Signed   By: Marijo Sanes M.D.   On: 11/28/2015 13:42   Ct Chest Wo Contrast  11/08/2014   CLINICAL DATA:  Primary adenocarcinoma lung. Right upper lobe resection.  EXAM: CT CHEST WITHOUT CONTRAST  TECHNIQUE: Multidetector CT imaging of the chest was performed following the standard protocol without IV contrast.  COMPARISON:  10/12/2013  FINDINGS: Mediastinum/Nodes: No axillary supraclavicular lymphadenopathy. No mediastinal hilar lymphadenopathy. No pericardial fluid.  Nodule enlargement of the right lobe of thyroid gland is again demonstrated measuring 2.3 cm.   Lungs/Pleura: Postsurgical change in the right upper lobe with mild thickening along the surgical margin superior to the right hilum (coronal image 66). This is not changed from comparison exam. There is no new or suspicious pulmonary nodules in the right lung. Left lung is clear.  Rounded 4 mm mm nodule in the right lung base left lung base on image 35, series 4 compares to 4 mm on prior remeasured  Upper abdomen: There is a large peripherally calcified density lesion the spleen, stable over multiple comparisons. No intraperitoneal free air.  Musculoskeletal: No aggressive osseous lesion.  IMPRESSION: 1. Stable postoperative change in the right hemithorax without recurrence. 2. Stable left lower lobe pulmonary nodule.   Electronically Signed   By: Suzy Bouchard M.D.   On: 11/08/2014 14:19   Ct Chest Wo Contrast  10/12/2013   CLINICAL DATA:  History of lung cancer status post right upper lobectomy in 2013. Followup study.  EXAM: CT CHEST WITHOUT CONTRAST  TECHNIQUE: Multidetector CT imaging of  the chest was performed following the standard protocol without IV contrast.  COMPARISON:  Chest CT 03/30/2013.  FINDINGS: Mediastinum: Heart size is normal. There is no significant pericardial fluid, thickening or pericardial calcification. Slight non aneurysmal prominence of the ascending thoracic aorta (3.7 cm in diameter) is incidentally noted, and is similar to prior studies. No pathologically enlarged mediastinal or hilar lymph nodes. Please note that accurate exclusion of hilar adenopathy is limited on noncontrast CT scans. Esophagus is unremarkable in appearance.  Lungs/Pleura: Status post right upper lobectomy. Small amount of scarring in the periphery of the right lower lobe is unchanged compared to prior examinations. 3 mm left lower lobe nodule (image 35 of series 4) is unchanged compared to prior study 06/13/2011 and can be considered benign requiring no imaging followup. No other suspicious appearing  pulmonary nodules or masses are noted. No acute consolidative airspace disease. No pleural effusions.  Upper Abdomen: Large rim calcified low-attenuation lesion in the spleen measuring 5.8 cm in diameter is similar to prior studies, presumably a posttraumatic cyst. Status post cholecystectomy.  Musculoskeletal: There are no aggressive appearing lytic or blastic lesions noted in the visualized portions of the skeleton. Soft tissue anchors in the right humeral head, presumably related to prior rotator cuff repair.  IMPRESSION: 1. Status post right upper lobectomy. No findings to suggest local recurrence of disease or metastatic disease in the thorax. 2. Incidental findings, as above, similar prior studies.   Electronically Signed   By: Vinnie Langton M.D.   On: 10/12/2013 12:34   Ct Chest Wo Contrast  03/30/2013   CLINICAL DATA:  Status post right upper lobe lung resection for neoplastic disease surveillance evaluation.  EXAM: CT CHEST WITHOUT CONTRAST  TECHNIQUE: Multidetector CT imaging of the chest was performed following the standard protocol without IV contrast.  COMPARISON:  09/08/2012, 02/04/2012  FINDINGS: Evaluation of the thoracic inlet demonstrates a lobular mass which appears stable in the right lobe of the thyroid, again consistent with a thyroid goiter. The thoracic inlet is otherwise unremarkable. The maximal diameter of the ascending aorta is also unchanged at 3.6 mm. Atherosclerotic calcifications identified within the aorta. There is no evidence of mediastinal or hilar adenopathy nor masses.  The lung parenchyma once again demonstrates postsurgical changes along the anterior peripheral base of the right lower lobe. Postsurgical changes, stable, are identified within the right hilar region. There is otherwise no evidence of pulmonary nodules, masses, infiltrates, effusions nor edema.  A stable calcified splenic cyst is again identified.  Remaining visualized upper abdominal viscera are  unremarkable.  IMPRESSION: 1. Stable postop changes within the right hemi thorax consistent with a right upper lobe lobectomy 2. No evidence of mediastinal nor hilar adenopathy 3. Probable border within the thyroid 4. Stable diameter of the ascending aorta   Electronically Signed   By: Margaree Mackintosh M.D.   On: 03/30/2013 12:19      Recent Labs: Lab Results  Component Value Date   WBC 10.1 03/08/2015   HGB 13.3 03/08/2015   HCT 38.5 03/08/2015   PLT 328 03/08/2015   GLUCOSE 120 (H) 11/12/2015   CHOL 179 11/12/2015   TRIG 180.0 (H) 11/12/2015   HDL 50.20 11/12/2015   LDLCALC 93 11/12/2015   ALT 13 11/12/2015   AST 14 11/12/2015   NA 137 11/12/2015   K 4.0 11/12/2015   CL 102 11/12/2015   CREATININE 0.63 11/12/2015   BUN 21 11/12/2015   CO2 30 11/12/2015   TSH 0.01 (L) 11/12/2015  INR 0.96 07/16/2011   HGBA1C 6.3 11/12/2015      Assessment / Plan:      p T2a, pN0 M0 well-differentiated adenocarcinoma of the right upper lobe resected 07/17/2011, without evidence of recurrence on serial CT scan until now , current scan New soft tissue mass is noted along the surgical suture line in the right lower lobe. This measures 28 x 20 mm and is consistent with tumor recurrence. There is also suspected tumor in the right lower lobe - plan PET scan , after poss biopsy of left hilar mass  Her ascending aorta is  mildly dilated, 3.6 cm unchanged  The right lobe of the thyroid is slightly enlarged consistent with goiter which she is being treated for./ Biopsy negative  See me after pet next week     Grace Isaac MD 11/28/2015 3:03 PM

## 2015-12-03 ENCOUNTER — Telehealth: Payer: Self-pay

## 2015-12-03 NOTE — Telephone Encounter (Signed)
Talked with patient, she is still interested in prolia injection, but will have to undergo lung scan soon---her surgeon for that procedure wanted her to wait until after he completes lung scan---patient will call back in about 2 to 3 weeks after that procedure has been completed---prolia injection has been verified with insurance and estimated copay of $0 is due---can schedule nurse visit anytime convenient for patient---ok to start prolia per dr burns--can talk with tamara if questions

## 2015-12-06 ENCOUNTER — Ambulatory Visit (HOSPITAL_COMMUNITY)
Admission: RE | Admit: 2015-12-06 | Discharge: 2015-12-06 | Disposition: A | Discharge status: Home / Self Care | Payer: Medicare Other | Source: Ambulatory Visit | Attending: Cardiothoracic Surgery | Admitting: Cardiothoracic Surgery

## 2015-12-06 ENCOUNTER — Ambulatory Visit (INDEPENDENT_AMBULATORY_CARE_PROVIDER_SITE_OTHER): Payer: Medicare Other | Admitting: Cardiothoracic Surgery

## 2015-12-06 ENCOUNTER — Encounter: Payer: Self-pay | Admitting: Cardiothoracic Surgery

## 2015-12-06 ENCOUNTER — Other Ambulatory Visit: Payer: Self-pay | Admitting: *Deleted

## 2015-12-06 VITALS — BP 150/78 | HR 83 | Resp 20 | Ht 61.0 in | Wt 123.0 lb

## 2015-12-06 DIAGNOSIS — R911 Solitary pulmonary nodule: Secondary | ICD-10-CM

## 2015-12-06 DIAGNOSIS — I700 Atherosclerosis of aorta: Secondary | ICD-10-CM | POA: Insufficient documentation

## 2015-12-06 DIAGNOSIS — R918 Other nonspecific abnormal finding of lung field: Secondary | ICD-10-CM | POA: Diagnosis not present

## 2015-12-06 DIAGNOSIS — D734 Cyst of spleen: Secondary | ICD-10-CM | POA: Insufficient documentation

## 2015-12-06 DIAGNOSIS — J984 Other disorders of lung: Secondary | ICD-10-CM

## 2015-12-06 DIAGNOSIS — N200 Calculus of kidney: Secondary | ICD-10-CM | POA: Diagnosis not present

## 2015-12-06 DIAGNOSIS — C3411 Malignant neoplasm of upper lobe, right bronchus or lung: Secondary | ICD-10-CM | POA: Diagnosis not present

## 2015-12-06 DIAGNOSIS — E049 Nontoxic goiter, unspecified: Secondary | ICD-10-CM | POA: Insufficient documentation

## 2015-12-06 DIAGNOSIS — K4020 Bilateral inguinal hernia, without obstruction or gangrene, not specified as recurrent: Secondary | ICD-10-CM | POA: Diagnosis not present

## 2015-12-06 DIAGNOSIS — I517 Cardiomegaly: Secondary | ICD-10-CM | POA: Insufficient documentation

## 2015-12-06 DIAGNOSIS — Z902 Acquired absence of lung [part of]: Secondary | ICD-10-CM | POA: Insufficient documentation

## 2015-12-06 LAB — GLUCOSE, CAPILLARY: Glucose-Capillary: 149 mg/dL — ABNORMAL HIGH (ref 65–99)

## 2015-12-06 MED ORDER — FLUDEOXYGLUCOSE F - 18 (FDG) INJECTION: 6.1000 | Freq: Once | INTRAVENOUS | Status: DC | PRN

## 2015-12-06 NOTE — Progress Notes (Signed)
Samantha Quinn       Samantha Quinn,[place] [address]             [phone]                       Samantha Quinn Samantha Quinn Medical Record #[id number] Date of Birth: 11-08-1927  [name], MD  Chief Complaint:   PostOp Follow Up Visit 07/17/2011  OPERATIVE REPORT  PREOPERATIVE DIAGNOSIS: Non-small-cell lung cancer, right upper lobe.  POSTOPERATIVE DIAGNOSIS: Non-small-cell lung cancer, right upper lobe.  SURGICAL PROCEDURES: Bronchoscopy, right video-assisted thoracoscopy,  right upper lobectomy with node dissection, and placement of On-Q  device.   p T2a, pN0 c M0  3.5 cm well-differentiated adenocarcinoma of the right upper lobe resected 07/17/2011  with right upper lobectomy and node dissection . ALK & EGFR negative, was seen by Dr. Verdie Mosher postoperatively, no chemotherapy was recommended postoperatively.   History of Present Illness:     Patient is an 82 year old female who underwent surgical resection of a 3.5 cm invasive well differentiated adenocarcinoma in the right upper lobe on 07/17/2011. She denies any hemoptysis or recurrent pulmonary infections . Currently she is primarily limited by chronic back pain  And left leg pain.  She's been followed on a regular basis with serial CT scans, the most recent one suggested new lesion in the right hilum, a PET scan was performed this morning the patient comes to the office to review the PET scan and decide on further diagnostic testing.    History  Smoking Status  . Never Smoker  Smokeless Tobacco  . Never Used    Patient had recent cataract surgery-still using postoperative drops but does not remember what they are Patient had a needle biopsy of the right thyroid mass in the past.   Allergies  Allergen Reactions  . Codeine Nausea And Vomiting  . Tramadol Other (See Comments)    syncope  . Demerol Nausea Only    Current Outpatient Prescriptions  Medication Sig Dispense Refill  .  amitriptyline (ELAVIL) 10 MG tablet Take 1 tablet (10 mg total) by mouth at bedtime as needed. 90 tablet 1  . bisoprolol-hydrochlorothiazide (ZIAC) 2.5-6.25 MG tablet Take 1 tablet by mouth daily. 90 tablet 3  . Calcium Carbonate-Vit D-Min (CALCIUM-VITAMIN D-MINERALS) 600-800 MG-UNIT CHEW Chew by mouth daily.    [name] gabapentin (NEURONTIN) 100 MG capsule Take 200 mg by mouth 3 (three) times daily. Take 1 capsule by mouth up to three times a day    . ibuprofen (ADVIL,MOTRIN) 200 MG tablet Take 200 mg by mouth every 6 (six) hours as needed for fever, headache or mild pain.     [name] levothyroxine (SYNTHROID, LEVOTHROID) 25 MCG tablet Take 1 tablet (25 mcg total) by mouth daily before breakfast. 90 tablet 1  . lovastatin (MEVACOR) 20 MG tablet Take 1 tablet (20 mg total) by mouth every other day. 90 tablet 1  . meclizine (ANTIVERT) 25 MG tablet Take 25 mg by mouth 3 (three) times daily as needed for dizziness.    . metFORMIN (GLUCOPHAGE) 500 MG tablet Take 1 tablet (500 mg total) by mouth 2 (two) times daily with a meal. 180 tablet 3  . ramipril (ALTACE) 2.5 MG capsule Take 1 capsule (2.5 mg total) by mouth daily. 90 capsule 2   No current facility-administered medications for this visit.  Facility-Administered Medications Ordered in Other Visits  Medication Dose Route Frequency Provider Last Rate Last Dose  . fludeoxyglucose F - 18 (FDG) injection 6.1 millicurie  6.1 millicurie Intravenous Once PRN Markus Daft, MD           Physical Exam: BP (!) 150/78 (BP Location: Left Arm, Patient Position: Sitting, Cuff Size: Small)   Pulse 83   Resp 20   Ht '5\' 1"'  (1.549 m)   Wt 123 lb (55.8 kg)   SpO2 97% Comment: RA  BMI 23.24 kg/m   General appearance: alert and cooperative Neurologic: intact Neck: The right thyroid lobe is slightly more prominent than the left. Heart: regular rate and rhythm, S1, S2 normal, no murmur, click, rub or gallop and normal apical impulse Lungs: clear to auscultation  bilaterally and normal percussion bilaterally Abdomen: soft, non-tender; bowel sounds normal; no masses,  no organomegaly Extremities: extremities normal, atraumatic, no cyanosis or edema Wound:  I do not appreciate any cervical or supraclavicular adenopathy, she does have a probable thyroid particularly the right lobe.  Diagnostic Studies & Laboratory data:         Recent Radiology Findings:  Nm Pet Image Restag (ps) Skull Base To Thigh  Result Date: 12/06/2015 CLINICAL DATA:  Initial treatment strategy for right lower lobe lung mass. EXAM: NUCLEAR MEDICINE PET SKULL BASE TO THIGH TECHNIQUE: 6.1 mCi F-18 FDG was injected intravenously. Full-ring PET imaging was performed from the skull base to thigh after the radiotracer. CT data was obtained and used for attenuation correction and anatomic localization. FASTING BLOOD GLUCOSE:  Value: 149 mg/dl COMPARISON:  11/28/2015 chest CT, and 07/03/2011 FINDINGS: NECK Slightly nodular right thyroid lobe is faintly hypermetabolic with maximum standard uptake value 3.4. The area is heterogeneous with several calcifications, with the right lobe measuring 3.7 by 2.1 cm on image 37 series 4 and measuring 3.3 by 2.3 cm on comparable image 50 of the CT dated from 07/03/2011. This previously also had a faintly hypermetabolic appearance and is thought to be stable over the past 4 years. CHEST Prior right upper lobectomy. In the right hilum involving the lower lobe at the site of previous concern and with soft tissue density measuring approximately 2.9 by 1.9 cm, the nodule has a maximum standard uptake value of 12.0. There is some volume loss and nodularity anteriorly in the right lower lobe near the fissure, with associated clustered vessels, maximum standard uptake value 2.0, unlikely to represent malignancy. This terminates in a small nodularity anteriorly which also is not hypermetabolic. Very faint hypermetabolism peripherally at the right lung base is thought to be  related to scarring. Mild cardiomegaly noted. Aortic atherosclerotic calcification noted. No significant adenopathy in the chest. ABDOMEN/PELVIS No abnormal hypermetabolic activity within the liver, pancreas, adrenal glands, or spleen. No hypermetabolic lymph nodes in the abdomen or pelvis. Note is made of a rim calcified fluid density lesion of the spleen not appreciably changed from 07/03/2011 as well as a 2 mm left kidney lower pole nonobstructive calculus. Prior cholecystectomy. Small bilateral inguinal hernias contain adipose tissue. Aortoiliac atherosclerotic vascular disease. SKELETON No focal hypermetabolic activity to suggest skeletal metastasis. IMPRESSION: 1. The perihilar right lower lobe mass is hypermetabolic with maximum standard uptake value of 12.0, compatible with malignancy. No other hypermetabolic activity suggestive of malignancy identified in the neck, chest, abdomen, or pelvis. In particular, the more inferiorly located volume loss/nodularity in the anterior right lower lobe is not hypermetabolic and most likely due to scarring. 2. Other imaging findings of  potential clinical significance: Right upper lobectomy. Enlarged and nodular right thyroid gland is faintly hypermetabolic but not changed over the past 4 years. Aortic atherosclerosis. Nonobstructive left nephrolithiasis. Stable rim calcified splenic cyst. Mild cardiomegaly. Small bilateral inguinal hernias contain adipose tissue. Electronically Signed   By: Van Clines M.D.   On: 12/06/2015 09:56   Ct Chest Wo Contrast  Result Date: 11/28/2015 CLINICAL DATA:  Restaging lung cancer. EXAM: CT CHEST WITHOUT CONTRAST TECHNIQUE: Multidetector CT imaging of the chest was performed following the standard protocol without IV contrast. COMPARISON:  Chest CT 11/08/2014 FINDINGS: Chest wall: No breast masses, supraclavicular or axillary lymphadenopathy. Stable right-sided thyroid goiter with multiple nodules. Cardiovascular: The heart is  normal in size. No pericardial effusion. Stable tortuosity, ectasia and calcification of the thoracic aorta. No focal aneurysm. Mediastinum/Nodes: Small scattered mediastinal and hilar lymph nodes. No mass or overt adenopathy. Lungs/Pleura: New soft tissue mass is noted along the surgical suture line in the right lower lobe. This measures 28 x 20 mm and is consistent with tumor recurrence. There is also suspected tumor in the right lower lobe best seen on image 67 series 4. This measures 20 x 15.5 mm. This continues down along the right heart border with a soft tissue mass with maximum diameter of 9 mm which is a definite change since the prior examination. This is also worrisome for tumor. PET-CT and biopsy is suggested. The remainder of the lungs are clear. I do not see any findings for metastatic pulmonary nodules. No acute pulmonary findings. Stable pleural scarring changes in the right lower lobe. Upper Abdomen: Stable rim calcified splenic cyst. No adrenal gland lesions or hepatic lesions. Musculoskeletal: No significant bony findings. IMPRESSION: 1. New soft tissue masses in the right lower lobe as described above. Findings worrisome for recurrent tumor. Recommend PET-CT and biopsy. 2. No mediastinal or hilar mass or adenopathy. 3. No findings for pulmonary or upper abdominal metastatic disease. Electronically Signed   By: Marijo Sanes M.D.   On: 11/28/2015 13:42   Ct Chest Wo Contrast  11/08/2014   CLINICAL DATA:  Primary adenocarcinoma lung. Right upper lobe resection.  EXAM: CT CHEST WITHOUT CONTRAST  TECHNIQUE: Multidetector CT imaging of the chest was performed following the standard protocol without IV contrast.  COMPARISON:  10/12/2013  FINDINGS: Mediastinum/Nodes: No axillary supraclavicular lymphadenopathy. No mediastinal hilar lymphadenopathy. No pericardial fluid.  Nodule enlargement of the right lobe of thyroid gland is again demonstrated measuring 2.3 cm.  Lungs/Pleura: Postsurgical change in  the right upper lobe with mild thickening along the surgical margin superior to the right hilum (coronal image 66). This is not changed from comparison exam. There is no new or suspicious pulmonary nodules in the right lung. Left lung is clear.  Rounded 4 mm mm nodule in the right lung base left lung base on image 35, series 4 compares to 4 mm on prior remeasured  Upper abdomen: There is a large peripherally calcified density lesion the spleen, stable over multiple comparisons. No intraperitoneal free air.  Musculoskeletal: No aggressive osseous lesion.  IMPRESSION: 1. Stable postoperative change in the right hemithorax without recurrence. 2. Stable left lower lobe pulmonary nodule.   Electronically Signed   By: Suzy Bouchard M.D.   On: 11/08/2014 14:19   Ct Chest Wo Contrast  10/12/2013   CLINICAL DATA:  History of lung cancer status post right upper lobectomy in 2013. Followup study.  EXAM: CT CHEST WITHOUT CONTRAST  TECHNIQUE: Multidetector CT imaging of the chest was  performed following the standard protocol without IV contrast.  COMPARISON:  Chest CT 03/30/2013.  FINDINGS: Mediastinum: Heart size is normal. There is no significant pericardial fluid, thickening or pericardial calcification. Slight non aneurysmal prominence of the ascending thoracic aorta (3.7 cm in diameter) is incidentally noted, and is similar to prior studies. No pathologically enlarged mediastinal or hilar lymph nodes. Please note that accurate exclusion of hilar adenopathy is limited on noncontrast CT scans. Esophagus is unremarkable in appearance.  Lungs/Pleura: Status post right upper lobectomy. Small amount of scarring in the periphery of the right lower lobe is unchanged compared to prior examinations. 3 mm left lower lobe nodule (image 35 of series 4) is unchanged compared to prior study 06/13/2011 and can be considered benign requiring no imaging followup. No other suspicious appearing pulmonary nodules or masses are noted. No  acute consolidative airspace disease. No pleural effusions.  Upper Abdomen: Large rim calcified low-attenuation lesion in the spleen measuring 5.8 cm in diameter is similar to prior studies, presumably a posttraumatic cyst. Status post cholecystectomy.  Musculoskeletal: There are no aggressive appearing lytic or blastic lesions noted in the visualized portions of the skeleton. Soft tissue anchors in the right humeral head, presumably related to prior rotator cuff repair.  IMPRESSION: 1. Status post right upper lobectomy. No findings to suggest local recurrence of disease or metastatic disease in the thorax. 2. Incidental findings, as above, similar prior studies.   Electronically Signed   By: Vinnie Langton M.D.   On: 10/12/2013 12:34   Ct Chest Wo Contrast  03/30/2013   CLINICAL DATA:  Status post right upper lobe lung resection for neoplastic disease surveillance evaluation.  EXAM: CT CHEST WITHOUT CONTRAST  TECHNIQUE: Multidetector CT imaging of the chest was performed following the standard protocol without IV contrast.  COMPARISON:  09/08/2012, 02/04/2012  FINDINGS: Evaluation of the thoracic inlet demonstrates a lobular mass which appears stable in the right lobe of the thyroid, again consistent with a thyroid goiter. The thoracic inlet is otherwise unremarkable. The maximal diameter of the ascending aorta is also unchanged at 3.6 mm. Atherosclerotic calcifications identified within the aorta. There is no evidence of mediastinal or hilar adenopathy nor masses.  The lung parenchyma once again demonstrates postsurgical changes along the anterior peripheral base of the right lower lobe. Postsurgical changes, stable, are identified within the right hilar region. There is otherwise no evidence of pulmonary nodules, masses, infiltrates, effusions nor edema.  A stable calcified splenic cyst is again identified.  Remaining visualized upper abdominal viscera are unremarkable.  IMPRESSION: 1. Stable postop changes  within the right hemi thorax consistent with a right upper lobe lobectomy 2. No evidence of mediastinal nor hilar adenopathy 3. Probable border within the thyroid 4. Stable diameter of the ascending aorta   Electronically Signed   By: Margaree Mackintosh M.D.   On: 03/30/2013 12:19      Recent Labs: Lab Results  Component Value Date   WBC 10.1 03/08/2015   HGB 13.3 03/08/2015   HCT 38.5 03/08/2015   PLT 328 03/08/2015   GLUCOSE 120 (H) 11/12/2015   CHOL 179 11/12/2015   TRIG 180.0 (H) 11/12/2015   HDL 50.20 11/12/2015   LDLCALC 93 11/12/2015   ALT 13 11/12/2015   AST 14 11/12/2015   NA 137 11/12/2015   K 4.0 11/12/2015   CL 102 11/12/2015   CREATININE 0.63 11/12/2015   BUN 21 11/12/2015   CO2 30 11/12/2015   TSH 0.01 (L) 11/12/2015   INR 0.96  07/16/2011   HGBA1C 6.3 11/12/2015      Assessment / Plan:      p T2a, pN0 M0 well-differentiated adenocarcinoma of the right upper lobe resected 07/17/2011, without evidence of recurrence on serial CT scan until now , current scan New soft tissue mass is noted along the surgical suture line in the right lower lobe. This measures 28 x 20 mm and is consistent with tumor recurrence. There is also suspected tumor in the right lower lobe -  PET scan . I recommend to the patient that we proceed with bronchoscopy with ebus and biopsy of the right hilar area to confirm the suspicion of recurrent carcinoma the lung.  After obtaining a tissue diagnosis she will be referred to medical and radiation oncology, she would not be a candidate for completion pneumonectomy on the right. We'll plan to proceed with bronchoscopy ebus and biopsy on Tuesday, August 15.  Her ascending aorta is  mildly dilated, 3.6 cm unchanged  The right lobe of the thyroid is slightly enlarged consistent with goiter which she is being treated for./ Biopsy negative    Grace Isaac MD 12/06/2015 2:38 PM

## 2015-12-09 ENCOUNTER — Encounter (HOSPITAL_COMMUNITY): Payer: Self-pay | Admitting: *Deleted

## 2015-12-09 NOTE — Progress Notes (Signed)
Samantha Quinn reports that CBGs range 120-147. Patient stated that she does not check it every day. I instructed patient to check CBG to check CBG and if it is less than 70 to treat it with Glucose Gel, Glucose tablets or 1/2 cup of clear juice like apple juice or cranberry juice, or 1/2 cup of regular soda. (not cream soda). I instructed patient to recheck CBG in 15 minutes and if CBG is not greater than 70, to  Call [phone] (pre- op). If it is before pre-op opens to retreat as before and recheck CBG in 15 minutes. I told patient to make note of time that liquid is taken and amount, that surgical time may have to be adjusted.

## 2015-12-10 ENCOUNTER — Ambulatory Visit (HOSPITAL_COMMUNITY)
Admission: RE | Admit: 2015-12-10 | Discharge: 2015-12-10 | Disposition: A | Discharge status: Home / Self Care | Payer: Medicare Other | Source: Ambulatory Visit | Attending: Cardiothoracic Surgery | Admitting: Cardiothoracic Surgery

## 2015-12-10 ENCOUNTER — Ambulatory Visit (HOSPITAL_COMMUNITY): Payer: Medicare Other

## 2015-12-10 ENCOUNTER — Ambulatory Visit (HOSPITAL_COMMUNITY): Payer: Medicare Other | Admitting: Anesthesiology

## 2015-12-10 ENCOUNTER — Encounter: Payer: Medicare Other | Admitting: Cardiothoracic Surgery

## 2015-12-10 ENCOUNTER — Other Ambulatory Visit: Payer: Self-pay

## 2015-12-10 ENCOUNTER — Encounter (HOSPITAL_COMMUNITY)
Admission: RE | Disposition: A | Discharge status: Home / Self Care | Payer: Self-pay | Source: Ambulatory Visit | Attending: Cardiothoracic Surgery

## 2015-12-10 DIAGNOSIS — Z85118 Personal history of other malignant neoplasm of bronchus and lung: Secondary | ICD-10-CM | POA: Insufficient documentation

## 2015-12-10 DIAGNOSIS — Z7984 Long term (current) use of oral hypoglycemic drugs: Secondary | ICD-10-CM | POA: Diagnosis not present

## 2015-12-10 DIAGNOSIS — C771 Secondary and unspecified malignant neoplasm of intrathoracic lymph nodes: Secondary | ICD-10-CM | POA: Diagnosis not present

## 2015-12-10 DIAGNOSIS — C801 Malignant (primary) neoplasm, unspecified: Secondary | ICD-10-CM | POA: Diagnosis not present

## 2015-12-10 DIAGNOSIS — Z09 Encounter for follow-up examination after completed treatment for conditions other than malignant neoplasm: Secondary | ICD-10-CM

## 2015-12-10 DIAGNOSIS — E049 Nontoxic goiter, unspecified: Secondary | ICD-10-CM | POA: Insufficient documentation

## 2015-12-10 DIAGNOSIS — Z902 Acquired absence of lung [part of]: Secondary | ICD-10-CM | POA: Insufficient documentation

## 2015-12-10 DIAGNOSIS — M79605 Pain in left leg: Secondary | ICD-10-CM | POA: Insufficient documentation

## 2015-12-10 DIAGNOSIS — R911 Solitary pulmonary nodule: Secondary | ICD-10-CM | POA: Diagnosis not present

## 2015-12-10 DIAGNOSIS — M549 Dorsalgia, unspecified: Secondary | ICD-10-CM | POA: Diagnosis not present

## 2015-12-10 DIAGNOSIS — R918 Other nonspecific abnormal finding of lung field: Secondary | ICD-10-CM | POA: Diagnosis present

## 2015-12-10 DIAGNOSIS — C779 Secondary and unspecified malignant neoplasm of lymph node, unspecified: Secondary | ICD-10-CM | POA: Diagnosis not present

## 2015-12-10 DIAGNOSIS — Z01818 Encounter for other preprocedural examination: Secondary | ICD-10-CM | POA: Diagnosis not present

## 2015-12-10 DIAGNOSIS — R222 Localized swelling, mass and lump, trunk: Secondary | ICD-10-CM | POA: Diagnosis not present

## 2015-12-10 DIAGNOSIS — Z79899 Other long term (current) drug therapy: Secondary | ICD-10-CM | POA: Diagnosis not present

## 2015-12-10 DIAGNOSIS — I10 Essential (primary) hypertension: Secondary | ICD-10-CM | POA: Insufficient documentation

## 2015-12-10 HISTORY — DX: Family history of other specified conditions: Z84.89

## 2015-12-10 HISTORY — PX: VIDEO BRONCHOSCOPY WITH ENDOBRONCHIAL ULTRASOUND: SHX6177

## 2015-12-10 HISTORY — DX: Reserved for inherently not codable concepts without codable children: IMO0001

## 2015-12-10 HISTORY — DX: Unspecified hearing loss, unspecified ear: H91.90

## 2015-12-10 LAB — CBC
HCT: 37.1 % (ref 36.0–46.0)
Hemoglobin: 11.8 g/dL — ABNORMAL LOW (ref 12.0–15.0)
MCH: 27.4 pg (ref 26.0–34.0)
MCHC: 31.8 g/dL (ref 30.0–36.0)
MCV: 86.1 fL (ref 78.0–100.0)
PLATELETS: 279 10*3/uL (ref 150–400)
RBC: 4.31 MIL/uL (ref 3.87–5.11)
RDW: 13.7 % (ref 11.5–15.5)
WBC: 4.9 10*3/uL (ref 4.0–10.5)

## 2015-12-10 LAB — APTT: aPTT: 27 seconds (ref 24–36)

## 2015-12-10 LAB — COMPREHENSIVE METABOLIC PANEL
ALT: 17 U/L (ref 14–54)
AST: 19 U/L (ref 15–41)
Albumin: 3.6 g/dL (ref 3.5–5.0)
Alkaline Phosphatase: 56 U/L (ref 38–126)
Anion gap: 10 (ref 5–15)
BUN: 18 mg/dL (ref 6–20)
CO2: 24 mmol/L (ref 22–32)
Calcium: 9.4 mg/dL (ref 8.9–10.3)
Chloride: 103 mmol/L (ref 101–111)
Creatinine, Ser: 0.67 mg/dL (ref 0.44–1.00)
GFR calc Af Amer: >60 mL/min (ref >60)
GFR calc non Af Amer: >60 mL/min (ref >60)
Glucose, Bld: 139 mg/dL — ABNORMAL HIGH (ref 65–99)
Potassium: 4 mmol/L (ref 3.5–5.1)
Sodium: 137 mmol/L (ref 135–145)
Total Bilirubin: 0.4 mg/dL (ref 0.3–1.2)
Total Protein: 6.2 g/dL — ABNORMAL LOW (ref 6.5–8.1)

## 2015-12-10 LAB — PROTIME-INR
INR: 0.92
Prothrombin Time: 12.3 seconds (ref 11.4–15.2)

## 2015-12-10 LAB — GLUCOSE, CAPILLARY
Glucose-Capillary: 111 mg/dL — ABNORMAL HIGH (ref 65–99)
Glucose-Capillary: 137 mg/dL — ABNORMAL HIGH (ref 65–99)

## 2015-12-10 SURGERY — BRONCHOSCOPY, WITH EBUS
Anesthesia: General

## 2015-12-10 MED ORDER — FENTANYL CITRATE (PF) 100 MCG/2ML IJ SOLN
INTRAMUSCULAR | Status: DC | PRN
  Administered 2015-12-10 (×2): 50 ug via INTRAVENOUS

## 2015-12-10 MED ORDER — PROPOFOL 10 MG/ML IV BOLUS
INTRAVENOUS | Status: DC | PRN
  Administered 2015-12-10: 150 mg via INTRAVENOUS

## 2015-12-10 MED ORDER — HYDROMORPHONE HCL 1 MG/ML IJ SOLN: 0.2500 mg | INTRAMUSCULAR | Status: DC | PRN

## 2015-12-10 MED ORDER — 0.9 % SODIUM CHLORIDE (POUR BTL) OPTIME
TOPICAL | Status: DC | PRN
  Administered 2015-12-10: 1000 mL

## 2015-12-10 MED ORDER — DEXAMETHASONE SODIUM PHOSPHATE 10 MG/ML IJ SOLN
INTRAMUSCULAR | Status: DC | PRN
  Administered 2015-12-10: 8 mg via INTRAVENOUS

## 2015-12-10 MED ORDER — ROCURONIUM BROMIDE 100 MG/10ML IV SOLN
INTRAVENOUS | Status: DC | PRN
  Administered 2015-12-10: 10 mg via INTRAVENOUS
  Administered 2015-12-10: 30 mg via INTRAVENOUS

## 2015-12-10 MED ORDER — LIDOCAINE HCL (CARDIAC) 20 MG/ML IV SOLN
INTRAVENOUS | Status: DC | PRN
Start: 2015-12-10 — End: 2015-12-10
  Administered 2015-12-10: 50 mg via INTRAVENOUS

## 2015-12-10 MED ORDER — PROPOFOL 10 MG/ML IV BOLUS
INTRAVENOUS | Status: AC
  Filled 2015-12-10: qty 20

## 2015-12-10 MED ORDER — MIDAZOLAM HCL 5 MG/5ML IJ SOLN: INTRAMUSCULAR | Status: DC | PRN

## 2015-12-10 MED ORDER — SUCCINYLCHOLINE CHLORIDE 20 MG/ML IJ SOLN
INTRAMUSCULAR | Status: DC | PRN
  Administered 2015-12-10: 100 mg via INTRAVENOUS

## 2015-12-10 MED ORDER — EPINEPHRINE HCL 1 MG/ML IJ SOLN
INTRAMUSCULAR | Status: DC | PRN
  Administered 2015-12-10: 1 mg

## 2015-12-10 MED ORDER — SUGAMMADEX SODIUM 200 MG/2ML IV SOLN
INTRAVENOUS | Status: DC | PRN
  Administered 2015-12-10: 300 mg via INTRAVENOUS

## 2015-12-10 MED ORDER — LACTATED RINGERS IV SOLN
INTRAVENOUS | Status: DC
  Administered 2015-12-10: 09:00:00 via INTRAVENOUS

## 2015-12-10 MED ORDER — EPINEPHRINE HCL 1 MG/ML IJ SOLN
INTRAMUSCULAR | Status: AC
  Filled 2015-12-10: qty 1

## 2015-12-10 MED ORDER — LACTATED RINGERS IV SOLN
INTRAVENOUS | Status: DC | PRN
  Administered 2015-12-10 (×2): via INTRAVENOUS

## 2015-12-10 MED ORDER — PROMETHAZINE HCL 25 MG/ML IJ SOLN: 6.2500 mg | INTRAMUSCULAR | Status: DC | PRN

## 2015-12-10 MED ORDER — FENTANYL CITRATE (PF) 250 MCG/5ML IJ SOLN
INTRAMUSCULAR | Status: AC
  Filled 2015-12-10: qty 5

## 2015-12-10 MED ORDER — ONDANSETRON HCL 4 MG/2ML IJ SOLN
INTRAMUSCULAR | Status: DC | PRN
  Administered 2015-12-10: 4 mg via INTRAVENOUS

## 2015-12-10 SURGICAL SUPPLY — 25 items
BRUSH CYTOL CELLEBRITY 1.5X140 (MISCELLANEOUS) ×2 IMPLANT
CANISTER SUCTION 2500CC (MISCELLANEOUS) ×2 IMPLANT
CONT SPEC 4OZ CLIKSEAL STRL BL (MISCELLANEOUS) ×4 IMPLANT
COVER DOME SNAP 22 D (MISCELLANEOUS) ×2 IMPLANT
COVER TABLE BACK 60X90 (DRAPES) ×2 IMPLANT
DRAPE PROXIMA HALF (DRAPES) ×2 IMPLANT
FORCEPS BIOP RJ4 1.8 (CUTTING FORCEPS) ×2 IMPLANT
GAUZE SPONGE 4X4 12PLY STRL (GAUZE/BANDAGES/DRESSINGS) ×2 IMPLANT
GLOVE BIO SURGEON STRL SZ 6.5 (GLOVE) ×2 IMPLANT
KIT CLEAN ENDO COMPLIANCE (KITS) ×6 IMPLANT
KIT ROOM TURNOVER OR (KITS) ×2 IMPLANT
MARKER SKIN DUAL TIP RULER LAB (MISCELLANEOUS) ×2 IMPLANT
NEEDLE BIOPSY TRANSBRONCH 21G (NEEDLE) IMPLANT
NEEDLE BLUNT 18X1 FOR OR ONLY (NEEDLE) IMPLANT
NEEDLE EBUS SONO TIP PENTAX (NEEDLE) ×2 IMPLANT
NS IRRIG 1000ML POUR BTL (IV SOLUTION) ×2 IMPLANT
OIL SILICONE PENTAX (PARTS (SERVICE/REPAIRS)) ×2 IMPLANT
PAD ARMBOARD 7.5X6 YLW CONV (MISCELLANEOUS) ×4 IMPLANT
SOLUTION ANTI FOG 6CC (MISCELLANEOUS) ×2 IMPLANT
SYR 20CC LL (SYRINGE) ×2 IMPLANT
SYR 20ML ECCENTRIC (SYRINGE) ×2 IMPLANT
TOWEL OR 17X24 6PK STRL BLUE (TOWEL DISPOSABLE) ×2 IMPLANT
TRAP SPECIMEN MUCOUS 40CC (MISCELLANEOUS) ×2 IMPLANT
TUBE CONNECTING 20X1/4 (TUBING) ×2 IMPLANT
WATER STERILE IRR 1000ML POUR (IV SOLUTION) ×2 IMPLANT

## 2015-12-10 NOTE — Anesthesia Preprocedure Evaluation (Addendum)
Anesthesia Evaluation  Patient identified by MRN, date of birth, ID band Patient awake    Reviewed: Allergy & Precautions, NPO status   History of Anesthesia Complications (+) history of anesthetic complications  Airway Mallampati: II  TM Distance: >3 FB Neck ROM: Full  Mouth opening: Limited Mouth Opening  Dental  (+) Teeth Intact   Pulmonary shortness of breath,    breath sounds clear to auscultation       Cardiovascular hypertension, negative cardio ROS   Rhythm:Regular Rate:Normal     Neuro/Psych  Neuromuscular disease    GI/Hepatic Neg liver ROS,   Endo/Other  diabetesHypothyroidism   Renal/GU negative Renal ROS     Musculoskeletal  (+) Arthritis ,   Abdominal   Peds  Hematology   Anesthesia Other Findings   Reproductive/Obstetrics                            Anesthesia Physical Anesthesia Plan  ASA: III  Anesthesia Plan: General   Post-op Pain Management:    Induction: Intravenous  Airway Management Planned: Oral ETT  Additional Equipment:   Intra-op Plan:   Post-operative Plan: Extubation in OR  Informed Consent: I have reviewed the patients History and Physical, chart, labs and discussed the procedure including the risks, benefits and alternatives for the proposed anesthesia with the patient or authorized representative who has indicated his/her understanding and acceptance.   Dental advisory given  Plan Discussed with: CRNA  Anesthesia Plan Comments:         Anesthesia Quick Evaluation

## 2015-12-10 NOTE — Anesthesia Procedure Notes (Signed)
Procedure Name: Intubation Date/Time: 12/10/2015 9:55 AM Performed by: Neldon Newport Pre-anesthesia Checklist: Timeout performed, Patient being monitored, Suction available, Emergency Drugs available and Patient identified Patient Re-evaluated:Patient Re-evaluated prior to inductionOxygen Delivery Method: Circle system utilized Preoxygenation: Pre-oxygenation with 100% oxygen Intubation Type: IV induction Ventilation: Mask ventilation without difficulty Laryngoscope Size: Mac, 3 and Glidescope Grade View: Grade III Tube type: Oral Tube size: 8.0 mm Number of attempts: 3 (Per Dr. Orene Desanctis) Airway Equipment and Method: Video-laryngoscopy Placement Confirmation: ETT inserted through vocal cords under direct vision,  positive ETCO2 and breath sounds checked- equal and bilateral Secured at: 20 cm Tube secured with: Tape Dental Injury: Teeth and Oropharynx as per pre-operative assessment

## 2015-12-10 NOTE — Brief Op Note (Signed)
      [address]       [place] [address]             [phone]      12/10/2015  11:18 AM  PATIENT:  Samantha Quinn  82 y.o. female  PRE-OPERATIVE DIAGNOSIS:  RIGHT LUNG LESION history of right upper lobectomy for NSCL  POST-OPERATIVE DIAGNOSIS:  RIGHT LUNG LESION- same- atypical cell on early exam  PROCEDURE:  Procedure(s): [place] with biopsies. (N/A)  SURGEON:  Surgeon(s) and Role:    * Grace Isaac, MD - Primary   ANESTHESIA:   general  EBL:  Total I/O In: 1100 [I.V.:1100] Out: 5 [Blood:5]  BLOOD ADMINISTERED:none  DRAINS: none   LOCAL MEDICATIONS USED:  NONE  SPECIMEN:  Source of Specimen:  10 r nodes and right upper lobe bronchial stump  DISPOSITION OF SPECIMEN:  PATHOLOGY  COUNTS:  YES  DICTATION: .Dragon Dictation  PLAN OF CARE: Discharge to home after PACU  PATIENT DISPOSITION:  PACU - hemodynamically stable.   Delay start of Pharmacological VTE agent (>24hrs) due to surgical blood loss or risk of bleeding: yes

## 2015-12-10 NOTE — Transfer of Care (Signed)
Immediate Anesthesia Transfer of Care Note  Patient: Samantha Quinn  Procedure(s) Performed: Procedure(s): VIDEO BRONCHOSCOPY WITH ENDOBRONCHIAL ULTRASOUND with biopsies. (N/A)  Patient Location: PACU  Anesthesia Type:General  Level of Consciousness: awake, alert  and oriented  Airway & Oxygen Therapy: Patient Spontanous Breathing and Patient connected to nasal cannula oxygen  Post-op Assessment: Report given to RN, Post -op Vital signs reviewed and stable and Patient moving all extremities X 4  Post vital signs: Reviewed and stable  Last Vitals:  Vitals:   12/10/15 0939 12/10/15 1133  BP: (!) 175/96 (!) 154/66  Pulse: 66 80  Resp: 20 18  Temp: 36.7 C 36.4 C    Last Pain:  Vitals:   12/10/15 0939  TempSrc: Oral      Patients Stated Pain Goal: 4 (80/22/17 9810)  Complications: No apparent anesthesia complications

## 2015-12-10 NOTE — H&P (Signed)
[address]       [place] [address]             [phone]                       Genny M Netherland [place] Medical Record #[id number] Date of Birth: May 30, 1927   Chief Complaint:   PostOp Follow Up Visit 07/17/2011  OPERATIVE REPORT  PREOPERATIVE DIAGNOSIS: Non-small-cell lung cancer, right upper lobe.  POSTOPERATIVE DIAGNOSIS: Non-small-cell lung cancer, right upper lobe.  SURGICAL PROCEDURES: Bronchoscopy, right video-assisted thoracoscopy,  right upper lobectomy with node dissection, and placement of On-Q  device.   p T2a, pN0 c M0  3.5 cm well-differentiated adenocarcinoma of the right upper lobe resected 07/17/2011  with right upper lobectomy and node dissection . ALK & EGFR negative, was seen by Dr. Verdie Mosher postoperatively, no chemotherapy was recommended postoperatively.   History of Present Illness:     Patient is an 82 year old female who underwent surgical resection of a 3.5 cm invasive well differentiated adenocarcinoma in the right upper lobe on 07/17/2011. She denies any hemoptysis or recurrent pulmonary infections . Currently she is primarily limited by chronic back pain  And left leg pain.  She's been followed on a regular basis with serial CT scans, the most recent one suggested new lesion in the right hilum, a PET scan was performed this morning the patient comes to the office to review the PET scan and decide on further diagnostic testing.    History  Smoking Status  . Never Smoker  Smokeless Tobacco  . Never Used    Patient had recent cataract surgery-still using postoperative drops but does not remember what they are Patient had a needle biopsy of the right thyroid mass in the past.   Allergies  Allergen Reactions  . Codeine Nausea And Vomiting  . Tramadol Other (See Comments)    syncope  . Demerol Nausea Only    Current Facility-Administered Medications  Medication Dose Route Frequency Provider Last Rate  Last Dose  . lactated ringers infusion   Intravenous Continuous Rica Koyanagi, MD 50 mL/hr at 12/10/15 0830     Facility-Administered Medications Ordered in Other Encounters  Medication Dose Route Frequency Provider Last Rate Last Dose  . fludeoxyglucose F - 18 (FDG) injection 6.1 millicurie  6.1 millicurie Intravenous Once PRN Markus Daft, MD      . lactated ringers infusion    Continuous PRN Neldon Newport, CRNA           Physical Exam: BP (!) 144/58   Pulse 73   Temp 97.7 F (36.5 C)   Resp 20   Ht _0  (1.549 m)   Wt 123 lb (55.8 kg)   SpO2 100%   BMI 23.24 kg/m   General appearance: alert and cooperative Neurologic: intact Neck: The right thyroid lobe is slightly more prominent than the left. Heart: regular rate and rhythm, S1, S2 normal, no murmur, click, rub or gallop and normal apical impulse Lungs: clear to auscultation bilaterally and normal percussion bilaterally Abdomen: soft, non-tender; bowel sounds normal; no masses,  no organomegaly Extremities: extremities normal, atraumatic, no cyanosis or edema Wound:  I do not appreciate any cervical or supraclavicular adenopathy, she does have a probable thyroid particularly the right lobe.  Diagnostic Studies & Laboratory data:  Recent Radiology Findings:  Dg Chest 2 View  Result Date: 12/10/2015 CLINICAL DATA:  Preoperative evaluation for lung biopsy EXAM: CHEST  2 VIEW COMPARISON:  Chest radiograph May 07, 2015; chest CT November 28, 2015; PET-CT December 06, 2015 FINDINGS: The known right perihilar region mass is not well appreciated by radiography. There is patchy atelectasis inferior to the right hilum medially. Lungs elsewhere clear. Heart size and pulmonary vascularity are normal. No adenopathy. No focal bone lesions evident. Postoperative changes noted in right shoulder. Calcified mass in the spleen noted by radiography, better seen by CT. IMPRESSION: No edema or consolidation. Medial right base  atelectasis. The known nodular lesion in the right perihilar region is not well appreciated by radiography. Electronically Signed   By: Lowella Grip III M.D.   On: 12/10/2015 08:25   Ct Chest Wo Contrast  Result Date: 11/28/2015 CLINICAL DATA:  Restaging lung cancer. EXAM: CT CHEST WITHOUT CONTRAST TECHNIQUE: Multidetector CT imaging of the chest was performed following the standard protocol without IV contrast. COMPARISON:  Chest CT 11/08/2014 FINDINGS: Chest wall: No breast masses, supraclavicular or axillary lymphadenopathy. Stable right-sided thyroid goiter with multiple nodules. Cardiovascular: The heart is normal in size. No pericardial effusion. Stable tortuosity, ectasia and calcification of the thoracic aorta. No focal aneurysm. Mediastinum/Nodes: Small scattered mediastinal and hilar lymph nodes. No mass or overt adenopathy. Lungs/Pleura: New soft tissue mass is noted along the surgical suture line in the right lower lobe. This measures 28 x 20 mm and is consistent with tumor recurrence. There is also suspected tumor in the right lower lobe best seen on image 67 series 4. This measures 20 x 15.5 mm. This continues down along the right heart border with a soft tissue mass with maximum diameter of 9 mm which is a definite change since the prior examination. This is also worrisome for tumor. PET-CT and biopsy is suggested. The remainder of the lungs are clear. I do not see any findings for metastatic pulmonary nodules. No acute pulmonary findings. Stable pleural scarring changes in the right lower lobe. Upper Abdomen: Stable rim calcified splenic cyst. No adrenal gland lesions or hepatic lesions. Musculoskeletal: No significant bony findings. IMPRESSION: 1. New soft tissue masses in the right lower lobe as described above. Findings worrisome for recurrent tumor. Recommend PET-CT and biopsy. 2. No mediastinal or hilar mass or adenopathy. 3. No findings for pulmonary or upper abdominal metastatic  disease. Electronically Signed   By: Marijo Sanes M.D.   On: 11/28/2015 13:42   Ct Chest Wo Contrast  11/08/2014   CLINICAL DATA:  Primary adenocarcinoma lung. Right upper lobe resection.  EXAM: CT CHEST WITHOUT CONTRAST  TECHNIQUE: Multidetector CT imaging of the chest was performed following the standard protocol without IV contrast.  COMPARISON:  10/12/2013  FINDINGS: Mediastinum/Nodes: No axillary supraclavicular lymphadenopathy. No mediastinal hilar lymphadenopathy. No pericardial fluid.  Nodule enlargement of the right lobe of thyroid gland is again demonstrated measuring 2.3 cm.  Lungs/Pleura: Postsurgical change in the right upper lobe with mild thickening along the surgical margin superior to the right hilum (coronal image 66). This is not changed from comparison exam. There is no new or suspicious pulmonary nodules in the right lung. Left lung is clear.  Rounded 4 mm mm nodule in the right lung base left lung base on image 35, series 4 compares to 4 mm on prior remeasured  Upper abdomen: There is a large peripherally calcified density lesion the spleen, stable over multiple comparisons. No intraperitoneal free air.  Musculoskeletal: No aggressive osseous lesion.  IMPRESSION: 1. Stable postoperative change in the right hemithorax without recurrence. 2. Stable left lower lobe pulmonary nodule.   Electronically Signed   By: Suzy Bouchard M.D.   On: 11/08/2014 14:19   Ct Chest Wo Contrast  10/12/2013   CLINICAL DATA:  History of lung cancer status post right upper lobectomy in 2013. Followup study.  EXAM: CT CHEST WITHOUT CONTRAST  TECHNIQUE: Multidetector CT imaging of the chest was performed following the standard protocol without IV contrast.  COMPARISON:  Chest CT 03/30/2013.  FINDINGS: Mediastinum: Heart size is normal. There is no significant pericardial fluid, thickening or pericardial calcification. Slight non aneurysmal prominence of the ascending thoracic aorta (3.7 cm in diameter) is  incidentally noted, and is similar to prior studies. No pathologically enlarged mediastinal or hilar lymph nodes. Please note that accurate exclusion of hilar adenopathy is limited on noncontrast CT scans. Esophagus is unremarkable in appearance.  Lungs/Pleura: Status post right upper lobectomy. Small amount of scarring in the periphery of the right lower lobe is unchanged compared to prior examinations. 3 mm left lower lobe nodule (image 35 of series 4) is unchanged compared to prior study 06/13/2011 and can be considered benign requiring no imaging followup. No other suspicious appearing pulmonary nodules or masses are noted. No acute consolidative airspace disease. No pleural effusions.  Upper Abdomen: Large rim calcified low-attenuation lesion in the spleen measuring 5.8 cm in diameter is similar to prior studies, presumably a posttraumatic cyst. Status post cholecystectomy.  Musculoskeletal: There are no aggressive appearing lytic or blastic lesions noted in the visualized portions of the skeleton. Soft tissue anchors in the right humeral head, presumably related to prior rotator cuff repair.  IMPRESSION: 1. Status post right upper lobectomy. No findings to suggest local recurrence of disease or metastatic disease in the thorax. 2. Incidental findings, as above, similar prior studies.   Electronically Signed   By: Vinnie Langton M.D.   On: 10/12/2013 12:34   Ct Chest Wo Contrast  03/30/2013   CLINICAL DATA:  Status post right upper lobe lung resection for neoplastic disease surveillance evaluation.  EXAM: CT CHEST WITHOUT CONTRAST  TECHNIQUE: Multidetector CT imaging of the chest was performed following the standard protocol without IV contrast.  COMPARISON:  09/08/2012, 02/04/2012  FINDINGS: Evaluation of the thoracic inlet demonstrates a lobular mass which appears stable in the right lobe of the thyroid, again consistent with a thyroid goiter. The thoracic inlet is otherwise unremarkable. The maximal  diameter of the ascending aorta is also unchanged at 3.6 mm. Atherosclerotic calcifications identified within the aorta. There is no evidence of mediastinal or hilar adenopathy nor masses.  The lung parenchyma once again demonstrates postsurgical changes along the anterior peripheral base of the right lower lobe. Postsurgical changes, stable, are identified within the right hilar region. There is otherwise no evidence of pulmonary nodules, masses, infiltrates, effusions nor edema.  A stable calcified splenic cyst is again identified.  Remaining visualized upper abdominal viscera are unremarkable.  IMPRESSION: 1. Stable postop changes within the right hemi thorax consistent with a right upper lobe lobectomy 2. No evidence of mediastinal nor hilar adenopathy 3. Probable border within the thyroid 4. Stable diameter of the ascending aorta   Electronically Signed   By: Margaree Mackintosh M.D.   On: 03/30/2013 12:19      Recent Labs: Lab Results  Component Value Date   WBC 4.9 12/10/2015   HGB 11.8 (L) 12/10/2015   HCT 37.1 12/10/2015  PLT 279 12/10/2015   GLUCOSE 139 (H) 12/10/2015   CHOL 179 11/12/2015   TRIG 180.0 (H) 11/12/2015   HDL 50.20 11/12/2015   LDLCALC 93 11/12/2015   ALT 17 12/10/2015   AST 19 12/10/2015   NA 137 12/10/2015   K 4.0 12/10/2015   CL 103 12/10/2015   CREATININE 0.67 12/10/2015   BUN 18 12/10/2015   CO2 24 12/10/2015   TSH 0.01 (L) 11/12/2015   INR 0.92 12/10/2015   HGBA1C 6.3 11/12/2015      Assessment / Plan:      p T2a, pN0 M0 well-differentiated adenocarcinoma of the right upper lobe resected 07/17/2011, without evidence of recurrence on serial CT scan until now , current scan New soft tissue mass is noted along the surgical suture line in the right lower lobe. This measures 28 x 20 mm and is consistent with tumor recurrence. There is also suspected tumor in the right lower lobe -  PET scan . I recommend to the patient that we proceed with bronchoscopy with ebus  and biopsy of the right hilar area to confirm the suspicion of recurrent carcinoma the lung.  After obtaining a tissue diagnosis she will be referred to medical and radiation oncology, she would not be a candidate for completion pneumonectomy on the right. We'll plan to proceed with bronchoscopy ebus and biopsy today .  Her ascending aorta is  mildly dilated, 3.6 cm unchanged  The right lobe of the thyroid is slightly enlarged consistent with goiter which she is being treated for./ Biopsy negative  The goals risks and alternatives of the planned surgical procedure bronchoscopy, EBUS, biopsy have been discussed with the patient in detail. The risks of the procedure including death, infection, stroke, myocardial infarction, bleeding, blood transfusion have all been discussed specifically.  I have quoted Marquette Saa a 1 % of perioperative mortality and a complication rate as high as 20%. The patient's questions have been answered.MAYTAL MIJANGOS is willing  to proceed with the planned procedure.   Grace Isaac MD 12/10/2015 9:23 AM

## 2015-12-10 NOTE — Discharge Instructions (Signed)
Flexible Bronchoscopy, Care After Refer to this sheet in the next few weeks. These instructions provide you with information on caring for yourself after your procedure. Your health care provider may also give you more specific instructions. Your treatment has been planned according to current medical practices, but problems sometimes occur. Call your health care provider if you have any problems or questions after your procedure.  WHAT TO EXPECT AFTER THE PROCEDURE It is normal to have the following symptoms for 24-48 hours after the procedure:   Increased cough.  Low-grade fever.  Sore throat or hoarse voice.  Small streaks of blood in your thick spit (sputum) if tissue samples were taken (biopsy). HOME CARE INSTRUCTIONS   Do not eat or drink anything for 2 hours after your procedure. Your nose and throat were numbed by medicine. If you try to eat or drink before the medicine wears off, food or drink could go into your lungs or you could burn yourself. After the numbness is gone and your cough and gag reflexes have returned, you may eat soft food and drink liquids slowly.   The day after the procedure, you can go back to your normal diet.   You may resume normal activities.   Keep all follow-up visits as directed by your health care provider. It is important to keep all your appointments, especially if tissue samples were taken for testing (biopsy). SEEK IMMEDIATE MEDICAL CARE IF:   You have increasing shortness of breath.   You become light-headed or faint.   You have chest pain.   You have any new concerning symptoms.  You cough up more than a small amount of blood.  The amount of blood you cough up increases. MAKE SURE YOU:  Understand these instructions.  Will watch your condition.  Will get help right away if you are not doing well or get worse.   This information is not intended to replace advice given to you by your health care provider. Make sure you discuss  any questions you have with your health care provider.   Document Released: 10/31/2004 Document Revised: 05/04/2014 Document Reviewed: 12/16/2012 Elsevier Interactive Patient Education [employer].

## 2015-12-11 ENCOUNTER — Encounter (HOSPITAL_COMMUNITY): Payer: Self-pay | Admitting: Cardiothoracic Surgery

## 2015-12-11 NOTE — Op Note (Signed)
Samantha Quinn, Samantha Quinn                 ACCOUNT NO.:  [id number]  MEDICAL RECORD NO.:  [id number]  LOCATION:  [facility]                         FACILITY:  [facility]  PHYSICIAN:  Lanelle Bal, MD    DATE OF BIRTH:  Aug 26, 1927  DATE OF PROCEDURE:  12/10/2015 DATE OF DISCHARGE:  12/10/2015                              OPERATIVE REPORT   PREOPERATIVE DIAGNOSIS:  Right hilar mass with history of non-small cell carcinoma and previous right upper lobectomy.  POSTOPERATIVE DIAGNOSIS:  Right hilar mass with history of non-small cell carcinoma and previous right upper lobectomy.  SURGICAL PROCEDURE:  Bronchoscopy with endobronchial ultrasound and transbronchial biopsies and biopsies of the bronchial stump.  SURGEON:  Lanelle Bal, M.D.  BRIEF HISTORY:  The patient is an 82 year old female, who 4-1/2 years previously had undergone a right upper lobectomy 3.5 cm non-small cell adenocarcinoma with negative nodes stage I.  The patient was seen by Dr. Sonny Dandy at that time for medical oncology consultation.  Since that time, the patient has been followed on a regular basis with CT scans.  The most recent CT scan revealed an enlarging area on the right.  A hilum PET scan was performed, which showed hypermetabolic activity in this area.  A bronchoscopy with biopsy and EBUS was recommended to the patient to obtain a tissue diagnosis of suspicion of recurrent non-small cell carcinoma.  The patient agreed and signed informed consent.  DESCRIPTION OF PROCEDURE:  The patient underwent general endotracheal anesthesia with a single-lumen #8 endotracheal tube.  Appropriate time- out was performed.  We then proceeded with fiberoptic bronchoscopy to the subsegmental level both at the right and left tracheobronchial trees.  Appropriate photographs were placed in the media tab in Epic. The left tracheobronchial tree was without any endobronchial lesions. The right tracheobronchial tree in the lower bronchus  intermedius was normal as was to lower lobe bronchus and the takeoff of the upper lobe bronchus which was a stump.  There were some prominent tissue folds.  We then removed the visual scope and placed the EBUS scope in position scoping the vicinity of the takeoff of the upper lobe bronchus locating what corresponded two lymph nodes that were hypermetabolic on the PET scan.  Multiple passes of this area were obtained and sent for quick smear and the remaining tissue placed in satellite.  Multiple passes were done and labeled 10R lymph nodes.  We then removed the EBUS scope and went back to the visual scope.  Brushings of the right upper lobe bronchial stump obtained as were multiple biopsies with a biopsy forceps.  Initial read on the smears was reported by Pathology as suspicious cells.  The patient tolerated the procedure without obvious complication, was extubated in the operating room and transferred to the recovery room for further postoperative care having tolerated the procedure without complications.     Lanelle Bal, MD     EG/MEDQ  D:  12/11/2015  T:  12/11/2015  Job:  [id number]

## 2015-12-11 NOTE — Anesthesia Postprocedure Evaluation (Signed)
Anesthesia Post Note  Patient: Samantha Quinn  Procedure(s) Performed: Procedure(s) (LRB): VIDEO BRONCHOSCOPY WITH ENDOBRONCHIAL ULTRASOUND with biopsies. (N/A)  Patient location during evaluation: PACU Anesthesia Type: General Level of consciousness: awake and alert Pain management: pain level controlled Vital Signs Assessment: post-procedure vital signs reviewed and stable Respiratory status: spontaneous breathing, nonlabored ventilation, respiratory function stable and patient connected to nasal cannula oxygen Cardiovascular status: blood pressure returned to baseline and stable Postop Assessment: no signs of nausea or vomiting Anesthetic complications: no    Last Vitals:  Vitals:   12/10/15 1304 12/10/15 1359  BP: 134/70 (!) 150/66  Pulse: 72 78  Resp: 18   Temp:      Last Pain:  Vitals:   12/10/15 0939  TempSrc: Oral                 Lindamarie Maclachlan,JAMES TERRILL

## 2015-12-12 ENCOUNTER — Telehealth: Payer: Self-pay | Admitting: Cardiothoracic Surgery

## 2015-12-12 NOTE — Telephone Encounter (Signed)
I called the patient and discussed with her the positive findings of recurrent adenocarcinoma in the 10 R lymph node that was biopsied earlier this week. She's been referred to the multidisciplinary thoracic oncology clinic to see medical oncology and radiation oncology in the near future, she is aware of this. Grace Isaac MD      [address] [place] [address] Office [phone]   [place]

## 2015-12-13 ENCOUNTER — Telehealth: Payer: Self-pay | Admitting: *Deleted

## 2015-12-13 ENCOUNTER — Encounter: Payer: Self-pay | Admitting: *Deleted

## 2015-12-13 DIAGNOSIS — R918 Other nonspecific abnormal finding of lung field: Secondary | ICD-10-CM | POA: Insufficient documentation

## 2015-12-13 NOTE — Telephone Encounter (Signed)
Oncology Nurse Navigator Documentation  Oncology Nurse Navigator Flowsheets 12/13/2015  Navigator Encounter Type Telephone;Introductory phone call/ I received a referral on Samantha Quinn.  I called and gave her an appt for [facility] 12/19/15.  She verbalized understanding of appt time and place.   Telephone Outgoing Call  Treatment Phase Pre-Tx/Tx Discussion  Barriers/Navigation Needs Coordination of Care  Interventions Coordination of Care  Coordination of Care Appts  Acuity Level 1  Acuity Level 1 Initial guidance, education and coordination as needed  Time Spent with Patient 15

## 2015-12-13 NOTE — Telephone Encounter (Signed)
Mailed clinic letter to pt.  

## 2015-12-13 NOTE — Telephone Encounter (Signed)
Ms. Copado called me back.  She had more questions about appt.  I clarified. I updated her that she will be getting a letter in the mail about the clinic.

## 2015-12-16 ENCOUNTER — Telehealth: Payer: Self-pay | Admitting: *Deleted

## 2015-12-16 ENCOUNTER — Encounter: Payer: Self-pay | Admitting: Cardiothoracic Surgery

## 2015-12-16 NOTE — Telephone Encounter (Signed)
Oncology Nurse Navigator Documentation  Oncology Nurse Navigator Flowsheets 12/16/2015  Navigator Encounter Type Telephone/I received a call from [place] office that patient would like to be seen with Dr. Marin Olp.  I called patient to get an update.  I was unable to reach her.  I left a vm message with my name and phone number to call.   Telephone Outgoing Call  Treatment Phase Pre-Tx/Tx Discussion  Barriers/Navigation Needs Coordination of Care  Interventions Coordination of Care  Coordination of Care Appts  Acuity Level 1  Time Spent with Patient 15

## 2015-12-17 ENCOUNTER — Telehealth: Payer: Self-pay | Admitting: *Deleted

## 2015-12-17 NOTE — Telephone Encounter (Signed)
Oncology Nurse Navigator Documentation  Oncology Nurse Navigator Flowsheets 12/17/2015  Navigator Encounter Type Telephone/I received 2 vm message from Samantha Quinn.  I called her back.  She stated she was not sure who she would like to see.  I listened as she explained. I stated she can she whomever she would like.  She stated she needed to speak with daughter.  I then received a call from her daughter.  She is not sure what to do.  She stated she would call me back to update me.    Telephone Outgoing Call  Treatment Phase Pre-Tx/Tx Discussion  Barriers/Navigation Needs Coordination of Care  Interventions Coordination of Care  Coordination of Care Appts  Acuity Level 1  Time Spent with Patient 30

## 2015-12-19 ENCOUNTER — Telehealth: Payer: Self-pay | Admitting: Internal Medicine

## 2015-12-19 ENCOUNTER — Other Ambulatory Visit (HOSPITAL_BASED_OUTPATIENT_CLINIC_OR_DEPARTMENT_OTHER): Payer: Medicare Other

## 2015-12-19 ENCOUNTER — Ambulatory Visit (HOSPITAL_BASED_OUTPATIENT_CLINIC_OR_DEPARTMENT_OTHER): Payer: Medicare Other | Admitting: Internal Medicine

## 2015-12-19 ENCOUNTER — Encounter: Payer: Self-pay | Admitting: Internal Medicine

## 2015-12-19 ENCOUNTER — Ambulatory Visit: Payer: Medicare Other | Attending: Internal Medicine | Admitting: Physical Therapy

## 2015-12-19 ENCOUNTER — Ambulatory Visit
Admission: RE | Admit: 2015-12-19 | Discharge: 2015-12-19 | Disposition: A | Discharge status: Home / Self Care | Payer: Medicare Other | Source: Ambulatory Visit | Attending: Radiation Oncology | Admitting: Radiation Oncology

## 2015-12-19 VITALS — BP 158/60 | HR 78 | Temp 98.7°F | Resp 17 | Ht 61.0 in | Wt 130.5 lb

## 2015-12-19 VITALS — BP 158/60 | HR 78 | Temp 98.7°F | Resp 17 | Wt 130.5 lb

## 2015-12-19 DIAGNOSIS — R293 Abnormal posture: Secondary | ICD-10-CM | POA: Diagnosis not present

## 2015-12-19 DIAGNOSIS — C3411 Malignant neoplasm of upper lobe, right bronchus or lung: Secondary | ICD-10-CM | POA: Diagnosis not present

## 2015-12-19 DIAGNOSIS — Z902 Acquired absence of lung [part of]: Secondary | ICD-10-CM | POA: Diagnosis not present

## 2015-12-19 DIAGNOSIS — C3491 Malignant neoplasm of unspecified part of right bronchus or lung: Secondary | ICD-10-CM

## 2015-12-19 DIAGNOSIS — M5442 Lumbago with sciatica, left side: Secondary | ICD-10-CM | POA: Diagnosis not present

## 2015-12-19 DIAGNOSIS — Z5111 Encounter for antineoplastic chemotherapy: Secondary | ICD-10-CM | POA: Insufficient documentation

## 2015-12-19 DIAGNOSIS — R2681 Unsteadiness on feet: Secondary | ICD-10-CM | POA: Insufficient documentation

## 2015-12-19 DIAGNOSIS — R918 Other nonspecific abnormal finding of lung field: Secondary | ICD-10-CM

## 2015-12-19 LAB — COMPREHENSIVE METABOLIC PANEL
ALT: 14 U/L (ref 0–55)
ANION GAP: 8 meq/L (ref 3–11)
AST: 14 U/L (ref 5–34)
Albumin: 3.5 g/dL (ref 3.5–5.0)
Alkaline Phosphatase: 75 U/L (ref 40–150)
BUN: 17.2 mg/dL (ref 7.0–26.0)
CALCIUM: 9.5 mg/dL (ref 8.4–10.4)
CHLORIDE: 105 meq/L (ref 98–109)
CO2: 26 meq/L (ref 22–29)
Creatinine: 0.7 mg/dL (ref 0.6–1.1)
EGFR: 78 mL/min/{1.73_m2} — ABNORMAL LOW (ref >90)
Glucose: 122 mg/dl (ref 70–140)
POTASSIUM: 3.6 meq/L (ref 3.5–5.1)
Sodium: 139 mEq/L (ref 136–145)
Total Bilirubin: 0.31 mg/dL (ref 0.20–1.20)
Total Protein: 7 g/dL (ref 6.4–8.3)

## 2015-12-19 LAB — CBC WITH DIFFERENTIAL/PLATELET
BASO%: 0.6 % (ref 0.0–2.0)
BASOS ABS: 0 10*3/uL (ref 0.0–0.1)
EOS%: 1.4 % (ref 0.0–7.0)
Eosinophils Absolute: 0.1 10*3/uL (ref 0.0–0.5)
HEMATOCRIT: 36.9 % (ref 34.8–46.6)
HGB: 12.4 g/dL (ref 11.6–15.9)
LYMPH#: 1.4 10*3/uL (ref 0.9–3.3)
LYMPH%: 19.2 % (ref 14.0–49.7)
MCH: 28.3 pg (ref 25.1–34.0)
MCHC: 33.7 g/dL (ref 31.5–36.0)
MCV: 84.1 fL (ref 79.5–101.0)
MONO#: 0.7 10*3/uL (ref 0.1–0.9)
MONO%: 9.2 % (ref 0.0–14.0)
NEUT#: 4.9 10*3/uL (ref 1.5–6.5)
NEUT%: 69.6 % (ref 38.4–76.8)
PLATELETS: 278 10*3/uL (ref 145–400)
RBC: 4.39 10*6/uL (ref 3.70–5.45)
RDW: 14.1 % (ref 11.2–14.5)
WBC: 7.1 10*3/uL (ref 3.9–10.3)

## 2015-12-19 NOTE — Telephone Encounter (Signed)
GAVE PATIENT AVS REPORT AND APPOINTMENTS FOR September AND October. [facility] WILL CALL RE SCAN - PATIENT AWARE.

## 2015-12-19 NOTE — Progress Notes (Addendum)
Radiation Oncology         [phone] ________________________________  Multidisciplinary Thoracic Oncology Clinic [facility]) Initial Outpatient Consultation  Name: Samantha Quinn MRN: [id number]  Date: 12/19/2015  DOB: 05/23/1927  HY:[id number] Lorretta Harp, MD  Grace Isaac, MD   REFERRING PHYSICIAN: Grace Isaac, MD  DIAGNOSIS: The encounter diagnosis was Primary adenocarcinoma of lung, right ([place]).    ICD-9-CM ICD-10-CM   1. Primary adenocarcinoma of lung, right (HCC) 162.9 C34.91     HISTORY OF PRESENT ILLNESS:Samantha Quinn is a 82 y.o. female who had previous right video assisted thoracoscopy with right upper lobectomy with node dissection which revealed a NSCLC, consistent with Stage IB, T2a, N0, M0 adenocarcinoma in March 2013. She has been followed in surveillance for her  Follow-up CT on 11/28/15 revealed a right infrahilar mass measuring 2 x 3 cm, and no evidence of adenopathy.  PET on 12/06/15 suggested increased uptake along this hilar mass and questionable mediastinal nodal recurrence.  Bronchoscopy on 12/10/15 with biopsy confirms adenocarcinoma within the level 10 R lymph node, but washings, RUL biopsy, and brushings were negative for disease.   The patient was referred today for presentation in the multidisciplinary conference.  Radiology studies and pathology slides were presented there for review and discussion of treatment options.  A consensus was discussed regarding potential next steps.  PREVIOUS RADIATION THERAPY: No  PAST MEDICAL HISTORY:  Past Medical History:  Diagnosis Date  . Back pain    bulding disc  . Bronchitis    hx of-many,many years   . Diabetes mellitus   . Diabetes mellitus    type 2 and takes Metformin bid  . Dyslipidemia   . Embolism - blood clot    hx of in left lower leg and was on Coumadin for about 75month;this was about 8-152yrago  . Family history of adverse reaction to anesthesia    Daughter- Nausea  . History of colonic polyps   .  HOH (hard of hearing)   . Hx of migraines    as a teenager  . Hypercholesterolemia   . Hyperlipidemia    takes Lovastatin every other day  . Hypertension    takes Ziac daily and Ramipril as well  . Hypothyroidism    takes Synthroid every other day  . Insomnia    takes Elavil nightly  . Joint pain   . Lung mass   . Myalgia   . Osteoarthritis   . Osteoporosis   . Pneumothorax of right lung after biopsy 07/09/11   HAD CHEST TUBE PLACEMENT  . PONV (postoperative nausea and vomiting)   . Primary adenocarcinoma of lung ([place]  . Shortness of breath dyspnea    With exertion  . Skin cancer    legs have dark spots on them  . Upper respiratory infection 04/2011  . Urinary incontinence    takes Detrol daily prn    PAST SURGICAL HISTORY: Past Surgical History:  Procedure Laterality Date  . ABDOMINAL HYSTERECTOMY    . APPENDECTOMY     as a child  . CHEST TUBE REMOVAL  07/13/11   RIGHT  CHEST TUBE  . CHOLECYSTECTOMY    . COLONOSCOPY    . COLONOSCOPY    . DILATION AND CURETTAGE OF UTERUS     x 2  . EYE SURGERY Bilateral    Cataract  . HEMORRHOID SURGERY    . LUNG BIOPSY  07/09/11   RIGHT UPER LOBE LUNG MASS   . RIGHT CHEST  TUBE PLACEMENT  07/10/11   S/P LUNG BX  . ROTATOR CUFF REPAIR     bilateral  . TOE SURGERY  2013   right small toe  . TONSILLECTOMY     as a child  . VIDEO BRONCHOSCOPY  07/17/2011   Procedure: VIDEO BRONCHOSCOPY;  Surgeon: Grace Isaac, MD;  Location: [place];  Service: Thoracic;  Laterality: N/A;  . VIDEO BRONCHOSCOPY WITH ENDOBRONCHIAL ULTRASOUND N/A 12/10/2015   Procedure: VIDEO BRONCHOSCOPY WITH ENDOBRONCHIAL ULTRASOUND with biopsies.;  Surgeon: Grace Isaac, MD;  Location: [place];  Service: Thoracic;  Laterality: N/A;    FAMILY HISTORY:  Family History  Problem Relation Age of Onset  . Hypertension Mother   . Hypertension Father   . Arthritis Sister   . Arthritis Sister   . Diabetes Brother   . Hypertension Sister   .  Anesthesia problems Neg Hx   . Hypotension Neg Hx   . Malignant hyperthermia Neg Hx   . Pseudochol deficiency Neg Hx   . Heart attack Neg Hx     SOCIAL HISTORY:  reports that she has never smoked. She has never used smokeless tobacco. She reports that she does not drink alcohol or use drugs.  ALLERGIES: Codeine; Tramadol; and Demerol  MEDICATIONS:  Current Outpatient Prescriptions  Medication Sig Dispense Refill  . amitriptyline (ELAVIL) 10 MG tablet Take 1 tablet (10 mg total) by mouth at bedtime as needed. (Patient taking differently: Take 5 mg by mouth at bedtime. ) 90 tablet 1  . bisoprolol-hydrochlorothiazide (ZIAC) 2.5-6.25 MG tablet Take 1 tablet by mouth daily. 90 tablet 3  . calcium carbonate (OS-CAL) 600 MG tablet Take 600 mg by mouth daily.    . Calcium Carbonate-Vit D-Min (CALCIUM-VITAMIN D-MINERALS) 600-800 MG-UNIT CHEW Chew by mouth daily.    [name] gabapentin (NEURONTIN) 100 MG capsule Take 200 mg by mouth 3 (three) times daily.     [name] ibuprofen (ADVIL,MOTRIN) 200 MG tablet Take 200 mg by mouth every 6 (six) hours as needed for fever, headache or mild pain.     [name] ketorolac (ACULAR) 0.5 % ophthalmic solution Place 1 drop into the right eye 4 (four) times daily.    [name] levothyroxine (SYNTHROID, LEVOTHROID) 25 MCG tablet Take 1 tablet (25 mcg total) by mouth daily before breakfast. 90 tablet 1  . lovastatin (MEVACOR) 20 MG tablet Take 1 tablet (20 mg total) by mouth every other day. 90 tablet 1  . meclizine (ANTIVERT) 25 MG tablet Take 25 mg by mouth 3 (three) times daily as needed for dizziness.    . metFORMIN (GLUCOPHAGE) 500 MG tablet Take 1 tablet (500 mg total) by mouth 2 (two) times daily with a meal. 180 tablet 3  . ofloxacin (OCUFLOX) 0.3 % ophthalmic solution Place 1 drop into the right eye 4 (four) times daily.    . prednisoLONE acetate (PRED FORTE) 1 % ophthalmic suspension Place 1 drop into the right eye 4 (four) times daily.    . ramipril (ALTACE) 2.5 MG capsule Take 1  capsule (2.5 mg total) by mouth daily. 90 capsule 2   No current facility-administered medications for this encounter.     REVIEW OF SYSTEMS:  On review of systems, the patient reports that She is doing well overall. she denies any chest pain, shortness of breath, cough, fevers, chills, night sweats, unintended weight changes. she denies any bowel or bladder disturbances, and denies abdominal pain, nausea or vomiting. she denies any new musculoskeletal or joint aches or pains. A  complete review of systems is obtained and is otherwise negative.    PHYSICAL EXAM:     Vitals with BMI 12/19/2015  Height '5\' 1"'$   Weight 130 lbs 8 oz  BMI 16.1  Systolic 096  Diastolic 60  Pulse 78  Respirations 17   In general this is a well appearing caucasian female in no acute distress. she is alert and oriented x4 and appropriate throughout the examination. HEENT reveals that the patient is normocephalic, atraumatic. EOMs are intact. PERRLA. Skin is intact without any evidence of gross lesions. Cardiovascular exam reveals a regular rate and rhythm, no clicks rubs or murmurs are auscultated. Chest is clear to auscultation bilaterally. Lymphatic assessment is performed and does not reveal any adenopathy in the cervical, supraclavicular, axillary, or inguinal chains. Abdomen has active bowel sounds in all quadrants and is intact. The abdomen is soft, non tender, non distended. Lower extremities are negative for pretibial pitting edema, deep calf tenderness, cyanosis or clubbing.   KPS = 80  100 - Normal; no complaints; no evidence of disease. 90   - Able to carry on normal activity; minor signs or symptoms of disease. 80   - Normal activity with effort; some signs or symptoms of disease. 75   - Cares for self; unable to carry on normal activity or to do active work. 60   - Requires occasional assistance, but is able to care for most of his personal needs. 50   - Requires considerable assistance and frequent  medical care. 58   - Disabled; requires special care and assistance. 21   - Severely disabled; hospital admission is indicated although death not imminent. 86   - Very sick; hospital admission necessary; active supportive treatment necessary. 10   - Moribund; fatal processes progressing rapidly. 0     - Dead  Karnofsky DA, [place], Craver LS and [facility] [phone]) The use of the nitrogen mustards in the palliative treatment of carcinoma: with particular reference to bronchogenic carcinoma Cancer 1 634-56  LABORATORY DATA:  Lab Results  Component Value Date   WBC 7.1 12/19/2015   HGB 12.4 12/19/2015   HCT 36.9 12/19/2015   MCV 84.1 12/19/2015   PLT 278 12/19/2015   Lab Results  Component Value Date   NA 139 12/19/2015   K 3.6 12/19/2015   CL 103 12/10/2015   CO2 26 12/19/2015   Lab Results  Component Value Date   ALT 14 12/19/2015   AST 14 12/19/2015   ALKPHOS 75 12/19/2015   BILITOT 0.31 12/19/2015    PULMONARY FUNCTION TEST:      RADIOGRAPHY: Dg Chest 2 View  Result Date: 12/10/2015 CLINICAL DATA:  Preoperative evaluation for lung biopsy EXAM: CHEST  2 VIEW COMPARISON:  Chest radiograph May 07, 2015; chest CT November 28, 2015; PET-CT December 06, 2015 FINDINGS: The known right perihilar region mass is not well appreciated by radiography. There is patchy atelectasis inferior to the right hilum medially. Lungs elsewhere clear. Heart size and pulmonary vascularity are normal. No adenopathy. No focal bone lesions evident. Postoperative changes noted in right shoulder. Calcified mass in the spleen noted by radiography, better seen by CT. IMPRESSION: No edema or consolidation. Medial right base atelectasis. The known nodular lesion in the right perihilar region is not well appreciated by radiography. Electronically Signed   By: Lowella Grip III M.D.   On: 12/10/2015 08:25   Ct Chest Wo Contrast  Result Date: 11/28/2015 CLINICAL DATA:  Restaging lung cancer. EXAM: CT  CHEST WITHOUT CONTRAST TECHNIQUE: Multidetector CT imaging of the chest was performed following the standard protocol without IV contrast. COMPARISON:  Chest CT 11/08/2014 FINDINGS: Chest wall: No breast masses, supraclavicular or axillary lymphadenopathy. Stable right-sided thyroid goiter with multiple nodules. Cardiovascular: The heart is normal in size. No pericardial effusion. Stable tortuosity, ectasia and calcification of the thoracic aorta. No focal aneurysm. Mediastinum/Nodes: Small scattered mediastinal and hilar lymph nodes. No mass or overt adenopathy. Lungs/Pleura: New soft tissue mass is noted along the surgical suture line in the right lower lobe. This measures 28 x 20 mm and is consistent with tumor recurrence. There is also suspected tumor in the right lower lobe best seen on image 67 series 4. This measures 20 x 15.5 mm. This continues down along the right heart border with a soft tissue mass with maximum diameter of 9 mm which is a definite change since the prior examination. This is also worrisome for tumor. PET-CT and biopsy is suggested. The remainder of the lungs are clear. I do not see any findings for metastatic pulmonary nodules. No acute pulmonary findings. Stable pleural scarring changes in the right lower lobe. Upper Abdomen: Stable rim calcified splenic cyst. No adrenal gland lesions or hepatic lesions. Musculoskeletal: No significant bony findings. IMPRESSION: 1. New soft tissue masses in the right lower lobe as described above. Findings worrisome for recurrent tumor. Recommend PET-CT and biopsy. 2. No mediastinal or hilar mass or adenopathy. 3. No findings for pulmonary or upper abdominal metastatic disease. Electronically Signed   By: Marijo Sanes M.D.   On: 11/28/2015 13:42   Nm Pet Image Restag (ps) Skull Base To Thigh  Result Date: 12/06/2015 CLINICAL DATA:  Initial treatment strategy for right lower lobe lung mass. EXAM: NUCLEAR MEDICINE PET SKULL BASE TO THIGH TECHNIQUE: 6.1  mCi F-18 FDG was injected intravenously. Full-ring PET imaging was performed from the skull base to thigh after the radiotracer. CT data was obtained and used for attenuation correction and anatomic localization. FASTING BLOOD GLUCOSE:  Value: 149 mg/dl COMPARISON:  11/28/2015 chest CT, and 07/03/2011 FINDINGS: NECK Slightly nodular right thyroid lobe is faintly hypermetabolic with maximum standard uptake value 3.4. The area is heterogeneous with several calcifications, with the right lobe measuring 3.7 by 2.1 cm on image 37 series 4 and measuring 3.3 by 2.3 cm on comparable image 50 of the CT dated from 07/03/2011. This previously also had a faintly hypermetabolic appearance and is thought to be stable over the past 4 years. CHEST Prior right upper lobectomy. In the right hilum involving the lower lobe at the site of previous concern and with soft tissue density measuring approximately 2.9 by 1.9 cm, the nodule has a maximum standard uptake value of 12.0. There is some volume loss and nodularity anteriorly in the right lower lobe near the fissure, with associated clustered vessels, maximum standard uptake value 2.0, unlikely to represent malignancy. This terminates in a small nodularity anteriorly which also is not hypermetabolic. Very faint hypermetabolism peripherally at the right lung base is thought to be related to scarring. Mild cardiomegaly noted. Aortic atherosclerotic calcification noted. No significant adenopathy in the chest. ABDOMEN/PELVIS No abnormal hypermetabolic activity within the liver, pancreas, adrenal glands, or spleen. No hypermetabolic lymph nodes in the abdomen or pelvis. Note is made of a rim calcified fluid density lesion of the spleen not appreciably changed from 07/03/2011 as well as a 2 mm left kidney lower pole nonobstructive calculus. Prior cholecystectomy. Small bilateral inguinal hernias contain adipose tissue. Aortoiliac atherosclerotic  vascular disease. SKELETON No focal  hypermetabolic activity to suggest skeletal metastasis. IMPRESSION: 1. The perihilar right lower lobe mass is hypermetabolic with maximum standard uptake value of 12.0, compatible with malignancy. No other hypermetabolic activity suggestive of malignancy identified in the neck, chest, abdomen, or pelvis. In particular, the more inferiorly located volume loss/nodularity in the anterior right lower lobe is not hypermetabolic and most likely due to scarring. 2. Other imaging findings of potential clinical significance: Right upper lobectomy. Enlarged and nodular right thyroid gland is faintly hypermetabolic but not changed over the past 4 years. Aortic atherosclerosis. Nonobstructive left nephrolithiasis. Stable rim calcified splenic cyst. Mild cardiomegaly. Small bilateral inguinal hernias contain adipose tissue. Electronically Signed   By: Van Clines M.D.   On: 12/06/2015 09:56   Dg Chest Port 1 View  Result Date: 12/10/2015 CLINICAL DATA:  Postop check EXAM: PORTABLE CHEST 1 VIEW COMPARISON:  12/10/2015 FINDINGS: Cardiac shadow is mildly enlarged but accentuated by the portable technique. Tortuous thoracic aorta is seen. Calcified splenic cyst is noted in the left upper quadrant. Mild vascular congestion is seen without interstitial edema. No focal infiltrate is noted. IMPRESSION: Mild vascular congestion without interstitial edema. Electronically Signed   By: Inez Catalina M.D.   On: 12/10/2015 13:35      IMPRESSION: 82 yo woman with Recurrent  Lung cancer, re-staged as recurrent IIA, T1b, N1, M0 adenocarcinoma of the right upper lung.  She is not a good surgical candidate.  PLAN:Today, I talked to the patient and family about the findings and work-up thus far.  We discussed the natural history of locally recurrent NSCLC and general treatment, highlighting the role of radiotherapy in the management.  We discussed the available radiation techniques, and focused on the details of logistics and  delivery.  We reviewed the anticipated acute and late sequelae associated with radiation in this setting.  The patient was encouraged to ask questions that I answered to the best of my ability.  I filled out a patient counseling form during our discussion including treatment diagrams.  We retained a copy for our records.  The patient would like to proceed with radiation and has been scheduled for CT simulation for tomorrow at 1 pm.  I spent 60 minutes minutes face to face with the patient and more than 50% of that time was spent in counseling and/or coordination of care.      Carola Rhine, PAC   ------------------------------------------------   Tyler Pita, MD [facility]  Radiation Oncology Medical Director and Director of Stereotactic Radiosurgery Direct Dial: [phone]  Fax: [phone] [email or web address]  Skype  [email or web address]        This document serves as a record of services personally performed by Tyler Pita, MD and Shona Simpson, PAC. It was created on his behalf by Bethann Humble, a trained medical scribe. The creation of this record is based on the scribe's personal observations and the provider's statements to them. This document has been checked and approved by the attending provider.

## 2015-12-19 NOTE — Therapy (Signed)
[place], [place], [address] Phone: [phone]   Fax:  [phone]  Physical Therapy Evaluation  Patient Details  Name: Samantha Quinn MRN: [id number] Date of Birth: Oct 05, 1927 Referring Provider: Dr. Curt Bears  Encounter Date: 12/19/2015      PT End of Session - 12/19/15 1429    Visit Number 1   Number of Visits 1   PT Start Time 1325   PT Stop Time 1355   PT Time Calculation (min) 30 min   Activity Tolerance Patient tolerated treatment well   Behavior During Therapy [facility] for tasks assessed/performed      Past Medical History:  Diagnosis Date  . Back pain    bulding disc  . Bronchitis    hx of-many,many years   . Diabetes mellitus   . Diabetes mellitus    type 2 and takes Metformin bid  . Dyslipidemia   . Embolism - blood clot    hx of in left lower leg and was on Coumadin for about 37month;this was about 8-121yrago  . Family history of adverse reaction to anesthesia    Daughter- Nausea  . History of colonic polyps   . HOH (hard of hearing)   . Hx of migraines    as a teenager  . Hypercholesterolemia   . Hyperlipidemia    takes Lovastatin every other day  . Hypertension    takes Ziac daily and Ramipril as well  . Hypothyroidism    takes Synthroid every other day  . Insomnia    takes Elavil nightly  . Joint pain   . Lung mass   . Myalgia   . Osteoarthritis   . Osteoporosis   . Pneumothorax of right lung after biopsy 07/09/11   HAD CHEST TUBE PLACEMENT  . PONV (postoperative nausea and vomiting)   . Primary adenocarcinoma of lung ([place]/27/2013  . Shortness of breath dyspnea    With exertion  . Skin cancer    legs have dark spots on them  . Upper respiratory infection 04/2011  . Urinary incontinence    takes Detrol daily prn    Past Surgical History:  Procedure Laterality Date  . ABDOMINAL HYSTERECTOMY    . APPENDECTOMY     as a child  . CHEST TUBE REMOVAL   07/13/11   RIGHT  CHEST TUBE  . CHOLECYSTECTOMY    . COLONOSCOPY    . COLONOSCOPY    . DILATION AND CURETTAGE OF UTERUS     x 2  . EYE SURGERY Bilateral    Cataract  . HEMORRHOID SURGERY    . LUNG BIOPSY  07/09/11   RIGHT UPER LOBE LUNG MASS   . RIGHT CHEST TUBE PLACEMENT  07/10/11   S/P LUNG BX  . ROTATOR CUFF REPAIR     bilateral  . TOE SURGERY  2013   right small toe  . TONSILLECTOMY     as a child  . VIDEO BRONCHOSCOPY  07/17/2011   Procedure: VIDEO BRONCHOSCOPY;  Surgeon: EdGrace IsaacMD;  [place] [facility] [place];  Laterality: N/A;  . VIDEO BRONCHOSCOPY WITH ENDOBRONCHIAL ULTRASOUND N/A 12/10/2015   Procedure: VIDEO BRONCHOSCOPY WITH ENDOBRONCHIAL ULTRASOUND with biopsies.;  Surgeon: EdGrace IsaacMD;  [facility];  Service: Thoracic;  Laterality: N/A;    There were no vitals filed for this visit.       Subjective Assessment - 12/19/15 1413    Subjective reports chronic back pain;  reports h/o bilateral rotator repair with resolved pain issues but left her with less than full AROM   Patient is accompained by: Family member  daughter and two sons   Pertinent History Patient with h/o non-small cell lung cancer (adenocarcinoma) s/p resection 06/2011 of a 3.5 cm. mass with no follow-up treatment, now with malignant cells present in a 10R node consistent with adenocarcinoma.  She is expected to have chemoradiation treatment.  Never smoker.  h/o back pain, bilateral rotator cuff repair; DM, HOH, HTN (controlled with meds), joint pain, myalgia, OA, osteoporosis   Limitations Standing   Patient Stated Goals get info from all lung clinic providers   Currently in Pain? Yes   Pain Score 0-No pain  but up to 8/10   Pain Location Back   Pain Orientation Lower   Pain Type Chronic pain   Pain Radiating Towards left leg   Aggravating Factors  walking a while   Pain Relieving Factors sit and elevate legs, take gabapentin            [facility] PT Assessment -  12/19/15 0001      Assessment   Medical Diagnosis lung adenocarcinoma   Referring Provider Dr. Curt Bears   Onset Date/Surgical Date 11/28/15     Precautions   Precautions Other (comment)   Precaution Comments cancer precautions, osteoporosis     Restrictions   Weight Bearing Restrictions No     Balance Screen   Has the patient fallen in the past 6 months No   Has the patient had a decrease in activity level because of a fear of falling?  No   Is the patient reluctant to leave their home because of a fear of falling?  No     Home [employer] residence   Living Arrangements Alone  two children nearby   Type of Home Other(Comment)  [place] level     Prior Function   Level of Independence Independent   Leisure no regular exercise, but reports being active throughout her day     Cognition   Overall Cognitive Status Within Functional Limits for tasks assessed     Observation/Other Assessments   Observations well-looking older woman with three children with her     Functional Tests   Functional tests Sit to Stand     Sit to Stand   Comments 7 times in 30 seconds, just below average for age     Posture/Postural Control   Posture/Postural Control Postural limitations   Postural Limitations Forward head;Increased thoracic kyphosis     ROM / Strength   AROM / PROM / Strength AROM     AROM   Overall AROM Comments trunk AROM mildly limited in all directions, but functional; shoulder AROM not formally assessed, but patient reports some limitations s/p rotator cuff repair bilat.  she reported feeling "woozy" after doing this; was unsteady     Ambulation/Gait   Ambulation/Gait Yes   Ambulation/Gait Assistance 7: Independent     Balance   Balance Assessed Yes     Dynamic Standing Balance   Dynamic Standing - Comments reaches forward 6 inches in standing, below average for her age                            PT Education - 12/19/15 1428    Education provided Yes   Education Details posture, breathing, energy conservation, walking or other  exercise, Cure article about staying active, PT info   Person(s) Educated Patient;Child(ren)   Methods Explanation;Handout   Comprehension Verbalized understanding               Lung Clinic Goals - 12/19/15 1436      Patient will be able to verbalize understanding of the benefit of exercise to decrease fatigue.   Status Achieved     Patient will be able to verbalize the importance of posture.   Status Achieved     Patient will be able to demonstrate diaphragmatic breathing for improved lung function.   Status Achieved     Patient will be able to verbalize understanding of the role of physical therapy to prevent functional decline and who to contact if physical therapy is needed.   Status Achieved             Plan - 12/19/15 1429    Clinical Impression Statement Patient who looks younger than her stated age, now with recurrence of adenocarcinoma of the lung, s/p resection 2013 and now expected to have chemoradiation treatment.  She has posture impairments, perhaps in part from osteoporosis, limited forward reach in standing, and slightly below normal score on sit to from stand for 30 seconds.  Her mobility is good at this time.  She appeared a little unsteady with performing standing trunk AROM and reported feeling a bit "woozy" doing this.  She has chronic back pain.   Rehab Potential Good   PT Frequency One time visit   PT Treatment/Interventions Patient/family education   PT Next Visit Plan None at this time; she may benefit from therapy going forward for balance if that is problematic   PT Home Exercise Plan walking or other endurance exercise, breathing exercise   Consulted and Agree with Plan of Care Patient      Patient will benefit from skilled therapeutic intervention in order to  improve the following deficits and impairments:  Decreased range of motion, Pain, Impaired UE functional use, Decreased balance  Visit Diagnosis: Abnormal posture - Plan: PT plan of care cert/re-cert  Unsteadiness on feet - Plan: PT plan of care cert/re-cert  Midline low back pain with left-sided sciatica - Plan: PT plan of care cert/re-cert      G-Codes - 21/30/86 1436    Functional Assessment Tool Used clinical judgement   Functional Limitation Changing and maintaining body position   Changing and Maintaining Body Position Current Status (V7846) At least 1 percent but less than 20 percent impaired, limited or restricted   Changing and Maintaining Body Position Goal Status (N6295) At least 1 percent but less than 20 percent impaired, limited or restricted   Changing and Maintaining Body Position Discharge Status (M8413) At least 1 percent but less than 20 percent impaired, limited or restricted       Problem List Patient Active Problem List   Diagnosis Date Noted  . Lung mass 12/13/2015  . Meralgia paresthetica of left side 07/10/2015  . Osteoporosis 07/10/2015  . Onychomycosis 07/10/2015  . Anxiety 07/10/2015  . Diabetes ([place]) 07/10/2015  . Cough 12/12/2013  . Multinodular goiter (nontoxic) 09/29/2012  . Pneumothorax after biopsy 07/09/2011    Class: Acute  . Primary adenocarcinoma of lung ([place]) 06/24/2011  . Hypercholesterolemia 10/20/2010  . Essential hypertension, benign 10/20/2010  . Hypothyroidism (acquired) 10/20/2010  . Osteoarthritis 10/20/2010    SALISBURY,DONNA 12/19/2015, 2:38 PM  [place], [place], [address] Phone: [phone]   Fax:  [phone]  Name: TAHRA HITZEMAN MRN: [id number] Date of Birth: June 11, 1927  Serafina Royals, PT 12/19/15 2:38 PM

## 2015-12-19 NOTE — Progress Notes (Signed)
[facility] Telephone:([phone]) [phone]   Fax:([phone] Multidisciplinary thoracic oncology clinic  CONSULT NOTE  REFERRING PHYSICIAN: Dr. Lanelle Bal  REASON FOR CONSULTATION:  82 years old white female with recurrent lung cancer.  HPI Samantha Quinn is a 82 y.o. female a never smoker with past medical history significant for hypertension, diabetes mellitus, hypothyroidism, osteoarthritis, dyslipidemia and anxiety. The patient was diagnosed in 2013 with stage IB (T2a, N0, M0) non-small cell lung cancer, adenocarcinoma measuring 3.5 cm status post right upper lobectomy with lymph node dissection on 07/17/2011 under the care of Dr. Servando Snare. Molecular studies showed negative EGFR and ALK mutations. The patient was seen by Dr. Sonny Dandy at that time and she was followed by observation. No adjuvant systemic chemotherapy was given according to the guideline. She has been followed by observation and repeat CT scan of the chest on 11/28/2015 showed suspected tumor in the right lower lobe measuring 2.0 x 1.6 cm. This continues down along the right heart border with a soft tissue mass with maximum diameter of 0.9 cm worrisome for tumor. This was followed by a PET scan on 12/06/2015 and it showed the perihilar right lower lobe mass was hypermetabolic with maximum SUV of 12.0 compatible with malignancy. No other hypermetabolic activity suggestive of malignancy identified in the neck, chest, abdomen or pelvis. The patient was seen by Dr. Servando Snare and on 12/10/2015 she underwent bronchoscopy with endobronchial ultrasound and transbronchial biopsies as well as biopsies of the bronchial stump. The final cytology (Accession: [id number]) of the fine-needle aspiration of 10R lymph node was positive for adenocarcinoma. The patient is not a good surgical candidate for resection because of her age and comorbidities. Dr. Servando Snare kindly referred the patient to the multidisciplinary thoracic oncology  clinic for evaluation and recommendation regarding treatment of her condition. When seen today she is feeling fine with no specific complaints. She denied having any significant chest pain, shortness breath, cough or hemoptysis. She denied having any significant weight loss or night sweats. The patient denied having any nausea, vomiting, diarrhea or constipation. She denied having any headache or visual changes. Family history significant for mother died from congestive heart failure at age 64 father also died from old age at age 49 and the patient had Brother with metastatic cancer. She is a widow and has 3 children. She was accompanied today by her 2 sons Jeneen Rinks and Merry Proud as well as her daughter, [name]. She was a housewife. She has no history of smoking but drinks alcohol occasionally and no history of drug abuse.  HPI  Past Medical History:  Diagnosis Date  . Back pain    bulding disc  . Bronchitis    hx of-many,many years   . Diabetes mellitus   . Diabetes mellitus    type 2 and takes Metformin bid  . Dyslipidemia   . Embolism - blood clot    hx of in left lower leg and was on Coumadin for about 72month;this was about 8-134yrago  . Family history of adverse reaction to anesthesia    Daughter- Nausea  . History of colonic polyps   . HOH (hard of hearing)   . Hx of migraines    as a teenager  . Hypercholesterolemia   . Hyperlipidemia    takes Lovastatin every other day  . Hypertension    takes Ziac daily and Ramipril as well  . Hypothyroidism    takes Synthroid every other day  . Insomnia    takes Elavil  nightly  . Joint pain   . Lung mass   . Myalgia   . Osteoarthritis   . Osteoporosis   . Pneumothorax of right lung after biopsy 07/09/11   HAD CHEST TUBE PLACEMENT  . PONV (postoperative nausea and vomiting)   . Primary adenocarcinoma of lung ([place]) 06/24/2011  . Shortness of breath dyspnea    With exertion  . Skin cancer    legs have dark spots on them  . Upper  respiratory infection 04/2011  . Urinary incontinence    takes Detrol daily prn    Past Surgical History:  Procedure Laterality Date  . ABDOMINAL HYSTERECTOMY    . APPENDECTOMY     as a child  . CHEST TUBE REMOVAL  07/13/11   RIGHT  CHEST TUBE  . CHOLECYSTECTOMY    . COLONOSCOPY    . COLONOSCOPY    . DILATION AND CURETTAGE OF UTERUS     x 2  . EYE SURGERY Bilateral    Cataract  . HEMORRHOID SURGERY    . LUNG BIOPSY  07/09/11   RIGHT UPER LOBE LUNG MASS   . RIGHT CHEST TUBE PLACEMENT  07/10/11   S/P LUNG BX  . ROTATOR CUFF REPAIR     bilateral  . TOE SURGERY  2013   right small toe  . TONSILLECTOMY     as a child  . VIDEO BRONCHOSCOPY  07/17/2011   Procedure: VIDEO BRONCHOSCOPY;  Surgeon: Grace Isaac, MD;  Location: [facility];  Service: Thoracic;  Laterality: N/A;  . VIDEO BRONCHOSCOPY WITH ENDOBRONCHIAL ULTRASOUND N/A 12/10/2015   Procedure: VIDEO BRONCHOSCOPY WITH ENDOBRONCHIAL ULTRASOUND with biopsies.;  Surgeon: Grace Isaac, MD;  Location: [facility];  Service: Thoracic;  Laterality: N/A;    Family History  Problem Relation Age of Onset  . Hypertension Mother   . Hypertension Father   . Arthritis Sister   . Arthritis Sister   . Diabetes Brother   . Hypertension Sister   . Anesthesia problems Neg Hx   . Hypotension Neg Hx   . Malignant hyperthermia Neg Hx   . Pseudochol deficiency Neg Hx   . Heart attack Neg Hx     Social History Social History  Substance Use Topics  . Smoking status: Never Smoker  . Smokeless tobacco: Never Used  . Alcohol use No    Allergies  Allergen Reactions  . Codeine Nausea And Vomiting  . Tramadol Other (See Comments)    syncope  . Demerol Nausea Only    Current Outpatient Prescriptions  Medication Sig Dispense Refill  . amitriptyline (ELAVIL) 10 MG tablet Take 1 tablet (10 mg total) by mouth at bedtime as needed. (Patient taking differently: Take 5 mg by mouth at bedtime. ) 90 tablet 1  . bisoprolol-hydrochlorothiazide  (ZIAC) 2.5-6.25 MG tablet Take 1 tablet by mouth daily. 90 tablet 3  . calcium carbonate (OS-CAL) 600 MG tablet Take 600 mg by mouth daily.    . Calcium Carbonate-Vit D-Min (CALCIUM-VITAMIN D-MINERALS) 600-800 MG-UNIT CHEW Chew by mouth daily.    [name] gabapentin (NEURONTIN) 100 MG capsule Take 200 mg by mouth 3 (three) times daily.     [name] ibuprofen (ADVIL,MOTRIN) 200 MG tablet Take 200 mg by mouth every 6 (six) hours as needed for fever, headache or mild pain.     [name] ketorolac (ACULAR) 0.5 % ophthalmic solution Place 1 drop into the right eye 4 (four) times daily.    [name] levothyroxine (SYNTHROID, LEVOTHROID) 25 MCG tablet Take 1 tablet (  25 mcg total) by mouth daily before breakfast. 90 tablet 1  . lovastatin (MEVACOR) 20 MG tablet Take 1 tablet (20 mg total) by mouth every other day. 90 tablet 1  . meclizine (ANTIVERT) 25 MG tablet Take 25 mg by mouth 3 (three) times daily as needed for dizziness.    . metFORMIN (GLUCOPHAGE) 500 MG tablet Take 1 tablet (500 mg total) by mouth 2 (two) times daily with a meal. 180 tablet 3  . ofloxacin (OCUFLOX) 0.3 % ophthalmic solution Place 1 drop into the right eye 4 (four) times daily.    . prednisoLONE acetate (PRED FORTE) 1 % ophthalmic suspension Place 1 drop into the right eye 4 (four) times daily.    . ramipril (ALTACE) 2.5 MG capsule Take 1 capsule (2.5 mg total) by mouth daily. 90 capsule 2   No current facility-administered medications for this visit.     Review of Systems  Constitutional: negative Eyes: negative Ears, nose, mouth, throat, and face: negative Respiratory: negative Cardiovascular: negative Gastrointestinal: negative Genitourinary:negative Integument/breast: negative Hematologic/lymphatic: negative Musculoskeletal:negative Neurological: negative Behavioral/Psych: negative Endocrine: negative Allergic/Immunologic: negative  Physical Exam  TWS:FKCLE, healthy, no distress, well nourished, well developed and anxious SKIN: skin  color, texture, turgor are normal, no rashes or significant lesions HEAD: Normocephalic, No masses, lesions, tenderness or abnormalities EYES: normal, PERRLA, Conjunctiva are pink and non-injected EARS: External ears normal, Canals clear OROPHARYNX:no exudate, no erythema and lips, buccal mucosa, and tongue normal  NECK: supple, no adenopathy, no JVD LYMPH:  no palpable lymphadenopathy, no hepatosplenomegaly BREAST:not examined LUNGS: clear to auscultation , and palpation HEART: regular rate & rhythm, no murmurs and no gallops ABDOMEN:abdomen soft, non-tender, normal bowel sounds and no masses or organomegaly BACK: Back symmetric, no curvature., No CVA tenderness EXTREMITIES:no joint deformities, effusion, or inflammation, no edema, no skin discoloration  NEURO: alert & oriented x 3 with fluent speech, no focal motor/sensory deficits  PERFORMANCE STATUS: ECOG 1  LABORATORY DATA: Lab Results  Component Value Date   WBC 7.1 12/19/2015   HGB 12.4 12/19/2015   HCT 36.9 12/19/2015   MCV 84.1 12/19/2015   PLT 278 12/19/2015      Chemistry      Component Value Date/Time   NA 139 12/19/2015 1243   K 3.6 12/19/2015 1243   CL 103 12/10/2015 0758   CO2 26 12/19/2015 1243   BUN 17.2 12/19/2015 1243   CREATININE 0.7 12/19/2015 1243      Component Value Date/Time   CALCIUM 9.5 12/19/2015 1243   ALKPHOS 75 12/19/2015 1243   AST 14 12/19/2015 1243   ALT 14 12/19/2015 1243   BILITOT 0.31 12/19/2015 1243       RADIOGRAPHIC STUDIES: Dg Chest 2 View  Result Date: 12/10/2015 CLINICAL DATA:  Preoperative evaluation for lung biopsy EXAM: CHEST  2 VIEW COMPARISON:  Chest radiograph May 07, 2015; chest CT November 28, 2015; PET-CT December 06, 2015 FINDINGS: The known right perihilar region mass is not well appreciated by radiography. There is patchy atelectasis inferior to the right hilum medially. Lungs elsewhere clear. Heart size and pulmonary vascularity are normal. No adenopathy. No  focal bone lesions evident. Postoperative changes noted in right shoulder. Calcified mass in the spleen noted by radiography, better seen by CT. IMPRESSION: No edema or consolidation. Medial right base atelectasis. The known nodular lesion in the right perihilar region is not well appreciated by radiography. Electronically Signed   By: Lowella Grip III M.D.   On: 12/10/2015 08:25   Ct  Chest Wo Contrast  Result Date: 11/28/2015 CLINICAL DATA:  Restaging lung cancer. EXAM: CT CHEST WITHOUT CONTRAST TECHNIQUE: Multidetector CT imaging of the chest was performed following the standard protocol without IV contrast. COMPARISON:  Chest CT 11/08/2014 FINDINGS: Chest wall: No breast masses, supraclavicular or axillary lymphadenopathy. Stable right-sided thyroid goiter with multiple nodules. Cardiovascular: The heart is normal in size. No pericardial effusion. Stable tortuosity, ectasia and calcification of the thoracic aorta. No focal aneurysm. Mediastinum/Nodes: Small scattered mediastinal and hilar lymph nodes. No mass or overt adenopathy. Lungs/Pleura: New soft tissue mass is noted along the surgical suture line in the right lower lobe. This measures 28 x 20 mm and is consistent with tumor recurrence. There is also suspected tumor in the right lower lobe best seen on image 67 series 4. This measures 20 x 15.5 mm. This continues down along the right heart border with a soft tissue mass with maximum diameter of 9 mm which is a definite change since the prior examination. This is also worrisome for tumor. PET-CT and biopsy is suggested. The remainder of the lungs are clear. I do not see any findings for metastatic pulmonary nodules. No acute pulmonary findings. Stable pleural scarring changes in the right lower lobe. Upper Abdomen: Stable rim calcified splenic cyst. No adrenal gland lesions or hepatic lesions. Musculoskeletal: No significant bony findings. IMPRESSION: 1. New soft tissue masses in the right lower lobe  as described above. Findings worrisome for recurrent tumor. Recommend PET-CT and biopsy. 2. No mediastinal or hilar mass or adenopathy. 3. No findings for pulmonary or upper abdominal metastatic disease. Electronically Signed   By: Marijo Sanes M.D.   On: 11/28/2015 13:42   Nm Pet Image Restag (ps) Skull Base To Thigh  Result Date: 12/06/2015 CLINICAL DATA:  Initial treatment strategy for right lower lobe lung mass. EXAM: NUCLEAR MEDICINE PET SKULL BASE TO THIGH TECHNIQUE: 6.1 mCi F-18 FDG was injected intravenously. Full-ring PET imaging was performed from the skull base to thigh after the radiotracer. CT data was obtained and used for attenuation correction and anatomic localization. FASTING BLOOD GLUCOSE:  Value: 149 mg/dl COMPARISON:  11/28/2015 chest CT, and 07/03/2011 FINDINGS: NECK Slightly nodular right thyroid lobe is faintly hypermetabolic with maximum standard uptake value 3.4. The area is heterogeneous with several calcifications, with the right lobe measuring 3.7 by 2.1 cm on image 37 series 4 and measuring 3.3 by 2.3 cm on comparable image 50 of the CT dated from 07/03/2011. This previously also had a faintly hypermetabolic appearance and is thought to be stable over the past 4 years. CHEST Prior right upper lobectomy. In the right hilum involving the lower lobe at the site of previous concern and with soft tissue density measuring approximately 2.9 by 1.9 cm, the nodule has a maximum standard uptake value of 12.0. There is some volume loss and nodularity anteriorly in the right lower lobe near the fissure, with associated clustered vessels, maximum standard uptake value 2.0, unlikely to represent malignancy. This terminates in a small nodularity anteriorly which also is not hypermetabolic. Very faint hypermetabolism peripherally at the right lung base is thought to be related to scarring. Mild cardiomegaly noted. Aortic atherosclerotic calcification noted. No significant adenopathy in the chest.  ABDOMEN/PELVIS No abnormal hypermetabolic activity within the liver, pancreas, adrenal glands, or spleen. No hypermetabolic lymph nodes in the abdomen or pelvis. Note is made of a rim calcified fluid density lesion of the spleen not appreciably changed from 07/03/2011 as well as a 2 mm left  kidney lower pole nonobstructive calculus. Prior cholecystectomy. Small bilateral inguinal hernias contain adipose tissue. Aortoiliac atherosclerotic vascular disease. SKELETON No focal hypermetabolic activity to suggest skeletal metastasis. IMPRESSION: 1. The perihilar right lower lobe mass is hypermetabolic with maximum standard uptake value of 12.0, compatible with malignancy. No other hypermetabolic activity suggestive of malignancy identified in the neck, chest, abdomen, or pelvis. In particular, the more inferiorly located volume loss/nodularity in the anterior right lower lobe is not hypermetabolic and most likely due to scarring. 2. Other imaging findings of potential clinical significance: Right upper lobectomy. Enlarged and nodular right thyroid gland is faintly hypermetabolic but not changed over the past 4 years. Aortic atherosclerosis. Nonobstructive left nephrolithiasis. Stable rim calcified splenic cyst. Mild cardiomegaly. Small bilateral inguinal hernias contain adipose tissue. Electronically Signed   By: Van Clines M.D.   On: 12/06/2015 09:56   Dg Chest Port 1 View  Result Date: 12/10/2015 CLINICAL DATA:  Postop check EXAM: PORTABLE CHEST 1 VIEW COMPARISON:  12/10/2015 FINDINGS: Cardiac shadow is mildly enlarged but accentuated by the portable technique. Tortuous thoracic aorta is seen. Calcified splenic cyst is noted in the left upper quadrant. Mild vascular congestion is seen without interstitial edema. No focal infiltrate is noted. IMPRESSION: Mild vascular congestion without interstitial edema. Electronically Signed   By: Inez Catalina M.D.   On: 12/10/2015 13:35    ASSESSMENT: This is a very  pleasant 82 years old white female with recurrent non-small cell lung cancer, stage II a (T1a, N1, M0) non-small cell lung cancer, adenocarcinoma presented with perihilar right lower lobe soft tissue mass with right hilar adenopathy diagnosed in August 2017. The patient has a history of stage IB (T2a, N0, M0) non-small cell lung cancer, adenocarcinoma diagnosed in March 2013 status post right upper lobectomy with lymph node dissection. The tumor was negative for EGFR and ALK mutations.   PLAN: I had a lengthy discussion with the patient and her family today about her current disease stage, prognosis and treatment options. According to Dr. Servando Snare, the patient is not a good surgical candidate for resection. I discussed with the patient and her family other treatment options and I recommended for her course of concurrent chemoradiation with weekly carboplatin for AUC of 2 and paclitaxel 45 MG/M2. I discussed with the patient adverse effect of the chemotherapy including but not limited to alopecia, myelosuppression, nausea and vomiting, peripheral neuropathy, liver or renal dysfunction. I will arrange for the patient to have a chemotherapy education class before starting the first dose of his chemotherapy. I will also send blood test for repeat EGFR mutation. The patient is expected to start the first dose of the concurrent chemoradiation on 01/06/2016. She would come back for follow-up visit in 3 weeks for evaluation and management of any adverse effect of her treatment. The patient and her family had several questions and I answered them completely to their satisfaction. She was advised to call immediately if she has any concerning symptoms in the interval. The patient was seen during the multidisciplinary thoracic oncology clinic today by medical oncology, radiation oncology, thoracic navigator and physical therapist. I will call her pharmacy with prescription for Compazine 10 mg by mouth every 6  hours as needed for nausea. The patient voices understanding of current disease status and treatment options and is in agreement with the current care plan.  All questions were answered. The patient knows to call the clinic with any problems, questions or concerns. We can certainly see the patient much sooner if necessary.  Thank you so much for allowing me to participate in the care of Samantha Quinn. I will continue to follow up the patient with you and assist in her care.  I spent 55 minutes counseling the patient face to face. The total time spent in the appointment was 80 minutes.  Disclaimer: This note was dictated with voice recognition software. Similar sounding words can inadvertently be transcribed and may not be corrected upon review.   Carmyn Hamm K. December 19, 2015, 2:37 PM

## 2015-12-20 ENCOUNTER — Ambulatory Visit: Payer: Medicare Other

## 2015-12-20 ENCOUNTER — Ambulatory Visit
Admission: RE | Admit: 2015-12-20 | Discharge: 2015-12-20 | Disposition: A | Discharge status: Home / Self Care | Payer: Medicare Other | Source: Ambulatory Visit | Attending: Radiation Oncology | Admitting: Radiation Oncology

## 2015-12-20 DIAGNOSIS — Z510 Encounter for antineoplastic radiation therapy: Secondary | ICD-10-CM | POA: Insufficient documentation

## 2015-12-20 DIAGNOSIS — C3491 Malignant neoplasm of unspecified part of right bronchus or lung: Secondary | ICD-10-CM | POA: Diagnosis not present

## 2015-12-20 LAB — HM DIABETES EYE EXAM

## 2015-12-21 DIAGNOSIS — Z23 Encounter for immunization: Secondary | ICD-10-CM | POA: Diagnosis not present

## 2015-12-22 NOTE — Progress Notes (Signed)
  Radiation Oncology         [phone] ________________________________  Name: Samantha Quinn MRN: [id number]  Date: 12/20/2015  DOB: 1928-04-19  SIMULATION AND TREATMENT PLANNING NOTE    ICD-9-CM ICD-10-CM   1. Primary adenocarcinoma of lung, right ([place]) 162.9 C34.91     DIAGNOSIS:  82 yo woman with Recurrent  Lung cancer, re-staged as recurrent IIA, T1b, N1, M0 adenocarcinoma of the right upper lung  NARRATIVE:  The patient was brought to the [facility].  Identity was confirmed.  All relevant records and images related to the planned course of therapy were reviewed.  The patient freely provided informed written consent to proceed with treatment after reviewing the details related to the planned course of therapy. The consent form was witnessed and verified by the simulation staff.  Then, the patient was set-up in a stable reproducible  supine position for radiation therapy.  CT images were obtained.  Surface markings were placed.  The CT images were loaded into the planning software.  Then the target and avoidance structures were contoured.  Treatment planning then occurred.  The radiation prescription was entered and confirmed.  Then, I designed and supervised the construction of a total of 6 medically necessary complex treatment devices, including a BodyFix immobilization mold custom fitted to the patient along with 5 multileaf collimators conformally shaped radiation around the treatment target while shielding critical structures such as the heart and spinal cord maximally.  I have requested : 3D Simulation  I have requested a DVH of the following structures: Left lung, right lung, spinal cord, heart, esophagus, and target.  I have ordered:Nutrition Consult  SPECIAL TREATMENT PROCEDURE:  The planned course of therapy using radiation constitutes a special treatment procedure. Special care is required in the management of this patient for the following reasons.  The patient will  be receiving concurrent chemotherapy requiring careful monitoring for increased toxicities of treatment including periodic laboratory values.  The special nature of the planned course of radiotherapy will require increased physician supervision and oversight to ensure patient's safety with optimal treatment outcomes.  PLAN:  The patient will receive 66 Gy in 33 fractions.  ________________________________  Sheral Apley Tammi Klippel, M.D.

## 2015-12-23 ENCOUNTER — Encounter: Payer: Self-pay | Admitting: Internal Medicine

## 2015-12-26 ENCOUNTER — Ambulatory Visit (HOSPITAL_COMMUNITY)
Admission: RE | Admit: 2015-12-26 | Discharge: 2015-12-26 | Disposition: A | Discharge status: Home / Self Care | Payer: Medicare Other | Source: Ambulatory Visit | Attending: Internal Medicine | Admitting: Internal Medicine

## 2015-12-26 DIAGNOSIS — C3491 Malignant neoplasm of unspecified part of right bronchus or lung: Secondary | ICD-10-CM | POA: Diagnosis not present

## 2015-12-26 MED ORDER — GADOBENATE DIMEGLUMINE 529 MG/ML IV SOLN
15.0000 mL | Freq: Once | INTRAVENOUS | Status: AC | PRN
  Administered 2015-12-26: 12 mL via INTRAVENOUS

## 2015-12-31 ENCOUNTER — Encounter: Payer: Self-pay | Admitting: *Deleted

## 2015-12-31 ENCOUNTER — Other Ambulatory Visit: Payer: Medicare Other

## 2016-01-01 ENCOUNTER — Other Ambulatory Visit: Payer: Self-pay | Admitting: Medical Oncology

## 2016-01-01 DIAGNOSIS — C3491 Malignant neoplasm of unspecified part of right bronchus or lung: Secondary | ICD-10-CM | POA: Diagnosis not present

## 2016-01-01 DIAGNOSIS — R112 Nausea with vomiting, unspecified: Secondary | ICD-10-CM

## 2016-01-01 DIAGNOSIS — T451X5A Adverse effect of antineoplastic and immunosuppressive drugs, initial encounter: Principal | ICD-10-CM

## 2016-01-01 DIAGNOSIS — Z510 Encounter for antineoplastic radiation therapy: Secondary | ICD-10-CM | POA: Diagnosis not present

## 2016-01-01 MED ORDER — PROCHLORPERAZINE MALEATE 10 MG PO TABS
10.0000 mg | ORAL_TABLET | Freq: Four times a day (QID) | ORAL | 0 refills | Status: DC | PRN
Start: 2016-01-01 — End: 2016-03-09

## 2016-01-02 ENCOUNTER — Telehealth: Payer: Self-pay | Admitting: *Deleted

## 2016-01-02 DIAGNOSIS — L570 Actinic keratosis: Secondary | ICD-10-CM | POA: Diagnosis not present

## 2016-01-02 DIAGNOSIS — B351 Tinea unguium: Secondary | ICD-10-CM | POA: Diagnosis not present

## 2016-01-02 DIAGNOSIS — Z85828 Personal history of other malignant neoplasm of skin: Secondary | ICD-10-CM | POA: Diagnosis not present

## 2016-01-02 NOTE — Telephone Encounter (Signed)
Received call from pt's daughter, Samantha Quinn asking for result of MRI.  She also has questions about treatment & what happens if the tumor doesn't shrink with chemo & radiation.  She asked about prognosis & was concerned that her mother was given different stages by Dr Julien Nordmann & Dr Tammi Klippel.  She asked for return call to [phone].  Message to Dr Julien Nordmann & Pod RN.

## 2016-01-02 NOTE — Telephone Encounter (Signed)
Both Dr. Tammi Klippel and myself mentioned stage IIA in our notes. Not sure where she saw the difference. Will discuss other options in the future if she does not respond to chemo and radiation.

## 2016-01-03 NOTE — Telephone Encounter (Addendum)
Message below left on Beths voice mail. Beth called back and I reiterated note below. She wants to speak with Julien Nordmann away from her mom. She has questions about prognosis and wants to know now rather than later what are the options  if current rx stops working.Note to [place].

## 2016-01-06 ENCOUNTER — Telehealth: Payer: Self-pay | Admitting: Internal Medicine

## 2016-01-06 ENCOUNTER — Encounter: Payer: Self-pay | Admitting: Internal Medicine

## 2016-01-06 ENCOUNTER — Ambulatory Visit (HOSPITAL_BASED_OUTPATIENT_CLINIC_OR_DEPARTMENT_OTHER): Payer: Medicare Other

## 2016-01-06 ENCOUNTER — Ambulatory Visit
Admission: RE | Admit: 2016-01-06 | Discharge: 2016-01-06 | Disposition: A | Discharge status: Home / Self Care | Payer: Medicare Other | Source: Ambulatory Visit | Attending: Radiation Oncology | Admitting: Radiation Oncology

## 2016-01-06 ENCOUNTER — Ambulatory Visit (HOSPITAL_BASED_OUTPATIENT_CLINIC_OR_DEPARTMENT_OTHER): Payer: Medicare Other | Admitting: Internal Medicine

## 2016-01-06 ENCOUNTER — Other Ambulatory Visit (HOSPITAL_BASED_OUTPATIENT_CLINIC_OR_DEPARTMENT_OTHER): Payer: Medicare Other

## 2016-01-06 VITALS — BP 168/69 | HR 73 | Temp 98.1°F | Resp 18

## 2016-01-06 VITALS — BP 136/57 | HR 73 | Temp 98.4°F | Resp 18 | Ht 61.0 in | Wt 132.6 lb

## 2016-01-06 DIAGNOSIS — Z510 Encounter for antineoplastic radiation therapy: Secondary | ICD-10-CM | POA: Diagnosis not present

## 2016-01-06 DIAGNOSIS — Z5111 Encounter for antineoplastic chemotherapy: Secondary | ICD-10-CM

## 2016-01-06 DIAGNOSIS — C3411 Malignant neoplasm of upper lobe, right bronchus or lung: Secondary | ICD-10-CM

## 2016-01-06 DIAGNOSIS — C3491 Malignant neoplasm of unspecified part of right bronchus or lung: Secondary | ICD-10-CM

## 2016-01-06 LAB — COMPREHENSIVE METABOLIC PANEL
ALBUMIN: 3.6 g/dL (ref 3.5–5.0)
ALK PHOS: 78 U/L (ref 40–150)
ALT: 21 U/L (ref 0–55)
AST: 18 U/L (ref 5–34)
Anion Gap: 13 mEq/L — ABNORMAL HIGH (ref 3–11)
BUN: 16.3 mg/dL (ref 7.0–26.0)
CO2: 22 meq/L (ref 22–29)
Calcium: 9.8 mg/dL (ref 8.4–10.4)
Chloride: 105 mEq/L (ref 98–109)
Creatinine: 0.8 mg/dL (ref 0.6–1.1)
EGFR: 72 mL/min/{1.73_m2} — ABNORMAL LOW (ref >90)
GLUCOSE: 138 mg/dL (ref 70–140)
POTASSIUM: 4 meq/L (ref 3.5–5.1)
SODIUM: 139 meq/L (ref 136–145)
TOTAL PROTEIN: 7 g/dL (ref 6.4–8.3)
Total Bilirubin: 0.39 mg/dL (ref 0.20–1.20)

## 2016-01-06 LAB — CBC WITH DIFFERENTIAL/PLATELET
BASO%: 0.4 % (ref 0.0–2.0)
BASOS ABS: 0 10*3/uL (ref 0.0–0.1)
EOS ABS: 0.1 10*3/uL (ref 0.0–0.5)
EOS%: 1.1 % (ref 0.0–7.0)
HCT: 37.3 % (ref 34.8–46.6)
HGB: 12.6 g/dL (ref 11.6–15.9)
LYMPH%: 21.5 % (ref 14.0–49.7)
MCH: 28.3 pg (ref 25.1–34.0)
MCHC: 33.8 g/dL (ref 31.5–36.0)
MCV: 83.8 fL (ref 79.5–101.0)
MONO#: 0.4 10*3/uL (ref 0.1–0.9)
MONO%: 7.2 % (ref 0.0–14.0)
NEUT#: 3.9 10*3/uL (ref 1.5–6.5)
NEUT%: 69.8 % (ref 38.4–76.8)
Platelets: 279 10*3/uL (ref 145–400)
RBC: 4.45 10*6/uL (ref 3.70–5.45)
RDW: 13.7 % (ref 11.2–14.5)
WBC: 5.6 10*3/uL (ref 3.9–10.3)
lymph#: 1.2 10*3/uL (ref 0.9–3.3)

## 2016-01-06 MED ORDER — DIPHENHYDRAMINE HCL 50 MG/ML IJ SOLN
50.0000 mg | Freq: Once | INTRAMUSCULAR | Status: AC
  Administered 2016-01-06: 50 mg via INTRAVENOUS

## 2016-01-06 MED ORDER — PACLITAXEL CHEMO INJECTION 300 MG/50ML
45.0000 mg/m2 | Freq: Once | INTRAVENOUS | Status: AC
  Administered 2016-01-06: 72 mg via INTRAVENOUS
  Filled 2016-01-06: qty 12

## 2016-01-06 MED ORDER — DEXAMETHASONE SODIUM PHOSPHATE 100 MG/10ML IJ SOLN
20.0000 mg | Freq: Once | INTRAMUSCULAR | Status: AC
  Administered 2016-01-06: 20 mg via INTRAVENOUS
  Filled 2016-01-06: qty 2

## 2016-01-06 MED ORDER — DIPHENHYDRAMINE HCL 50 MG/ML IJ SOLN
INTRAMUSCULAR | Status: AC
  Filled 2016-01-06: qty 1

## 2016-01-06 MED ORDER — FAMOTIDINE IN NACL 20-0.9 MG/50ML-% IV SOLN
INTRAVENOUS | Status: AC
  Filled 2016-01-06: qty 50

## 2016-01-06 MED ORDER — PALONOSETRON HCL INJECTION 0.25 MG/5ML
0.2500 mg | Freq: Once | INTRAVENOUS | Status: AC
  Administered 2016-01-06: 0.25 mg via INTRAVENOUS

## 2016-01-06 MED ORDER — PALONOSETRON HCL INJECTION 0.25 MG/5ML
INTRAVENOUS | Status: AC
  Filled 2016-01-06: qty 5

## 2016-01-06 MED ORDER — FAMOTIDINE IN NACL 20-0.9 MG/50ML-% IV SOLN
20.0000 mg | Freq: Once | INTRAVENOUS | Status: AC
  Administered 2016-01-06: 20 mg via INTRAVENOUS

## 2016-01-06 MED ORDER — CARBOPLATIN CHEMO INJECTION 450 MG/45ML
126.8000 mg | Freq: Once | INTRAVENOUS | Status: AC
  Administered 2016-01-06: 130 mg via INTRAVENOUS
  Filled 2016-01-06: qty 13

## 2016-01-06 MED ORDER — SODIUM CHLORIDE 0.9 % IV SOLN
Freq: Once | INTRAVENOUS | Status: AC
  Administered 2016-01-06: 13:00:00 via INTRAVENOUS

## 2016-01-06 NOTE — Progress Notes (Signed)
[facility] [phone]   [phone]  OFFICE PROGRESS NOTE  Binnie Rail, MD [place] [address]  DIAGNOSIS:  recurrent non-small cell lung cancer, stage IIA (T1a, N1, M0) non-small cell lung cancer, adenocarcinoma presented with perihilar right lower lobe soft tissue mass with right hilar adenopathy diagnosed in August 2017. The patient has a history of stage IB (T2a, N0, M0) non-small cell lung cancer, adenocarcinoma diagnosed in March 2013.  PRIOR THERAPY: Status post right upper lobectomy with lymph node dissection. The tumor was negative for EGFR and ALK mutations.  CURRENT THERAPY: A course of concurrent chemoradiation with weekly carboplatin for AUC of 2 and paclitaxel 45 MG/M2.  INTERVAL HISTORY: Samantha Quinn 82 y.o. female returns to the clinic today for follow-up visit accompanied by her daughter. The patient is feeling fine today with no specific complaints. She denied having any significant chest pain, shortness of breath, cough or hemoptysis. She has no nausea or vomiting. She denied having any significant weight loss or night sweats. She had MRI of the brain that showed no evidence for metastatic disease to the brain. She is here today to start the first cycle of concurrent chemoradiation.  MEDICAL HISTORY: Past Medical History:  Diagnosis Date  . Back pain    bulding disc  . Bronchitis    hx of-many,many years   . Diabetes mellitus   . Diabetes mellitus    type 2 and takes Metformin bid  . Dyslipidemia   . Embolism - blood clot    hx of in left lower leg and was on Coumadin for about 53month;this was about 8-189yrago  . Family history of adverse reaction to anesthesia    Daughter- Nausea  . History of colonic polyps   . HOH (hard of hearing)   . Hx of migraines    as a teenager  . Hypercholesterolemia   . Hyperlipidemia    takes Lovastatin every other day  . Hypertension    takes Ziac daily and Ramipril  as well  . Hypothyroidism    takes Synthroid every other day  . Insomnia    takes Elavil nightly  . Joint pain   . Lung mass   . Myalgia   . Osteoarthritis   . Osteoporosis   . Pneumothorax of right lung after biopsy 07/09/11   HAD CHEST TUBE PLACEMENT  . PONV (postoperative nausea and vomiting)   . [place]  . Shortness of breath dyspnea    With exertion  . Skin cancer    legs have dark spots on them  . Upper respiratory infection 04/2011  . Urinary incontinence    takes Detrol daily prn    ALLERGIES:  is allergic to codeine; tramadol; and demerol.  MEDICATIONS:  Current Outpatient Prescriptions  Medication Sig Dispense Refill  . amitriptyline (ELAVIL) 10 MG tablet Take 1 tablet (10 mg total) by mouth at bedtime as needed. (Patient taking differently: Take 5 mg by mouth at bedtime. ) 90 tablet 1  . bisoprolol-hydrochlorothiazide (ZIAC) 2.5-6.25 MG tablet Take 1 tablet by mouth daily. 90 tablet 3  . calcium carbonate (OS-CAL) 600 MG tablet Take 600 mg by mouth daily.    . Calcium Carbonate-Vit D-Min (CALCIUM-VITAMIN D-MINERALS) 600-800 MG-UNIT CHEW Chew by mouth daily.    . Marland Kitchenabapentin (NEURONTIN) 100 MG capsule Take 200 mg by mouth 3 (three) times daily.     . Marland Kitchenbuprofen (ADVIL,MOTRIN) 200 MG  tablet Take 200 mg by mouth every 6 (six) hours as needed for fever, headache or mild pain.     [name] levothyroxine (SYNTHROID, LEVOTHROID) 25 MCG tablet Take 1 tablet (25 mcg total) by mouth daily before breakfast. 90 tablet 1  . lovastatin (MEVACOR) 20 MG tablet Take 1 tablet (20 mg total) by mouth every other day. 90 tablet 1  . meclizine (ANTIVERT) 25 MG tablet Take 25 mg by mouth 3 (three) times daily as needed for dizziness.    . metFORMIN (GLUCOPHAGE) 500 MG tablet Take 1 tablet (500 mg total) by mouth 2 (two) times daily with a meal. 180 tablet 3  . ramipril (ALTACE) 2.5 MG capsule Take 1 capsule (2.5 mg total) by mouth daily. 90 capsule 2  .  prochlorperazine (COMPAZINE) 10 MG tablet Take 1 tablet (10 mg total) by mouth every 6 (six) hours as needed for nausea or vomiting. (Patient not taking: Reported on 01/06/2016) 30 tablet 0   No current facility-administered medications for this visit.     SURGICAL HISTORY:  Past Surgical History:  Procedure Laterality Date  . ABDOMINAL HYSTERECTOMY    . APPENDECTOMY     as a child  . CHEST TUBE REMOVAL  07/13/11   RIGHT  CHEST TUBE  . CHOLECYSTECTOMY    . COLONOSCOPY    . COLONOSCOPY    . DILATION AND CURETTAGE OF UTERUS     x 2  . EYE SURGERY Bilateral    Cataract  . HEMORRHOID SURGERY    . LUNG BIOPSY  07/09/11   RIGHT UPER LOBE LUNG MASS   . RIGHT CHEST TUBE PLACEMENT  07/10/11   S/P LUNG BX  . ROTATOR CUFF REPAIR     bilateral  . TOE SURGERY  2013   right small toe  . TONSILLECTOMY     as a child  . VIDEO BRONCHOSCOPY  07/17/2011   Procedure: VIDEO BRONCHOSCOPY;  Surgeon: Grace Isaac, MD;  Location: [facility];  Service: Thoracic;  Laterality: N/A;  . VIDEO BRONCHOSCOPY WITH ENDOBRONCHIAL ULTRASOUND N/A 12/10/2015   Procedure: VIDEO BRONCHOSCOPY WITH ENDOBRONCHIAL ULTRASOUND with biopsies.;  Surgeon: Grace Isaac, MD;  Location: [facility] OR;  Service: Thoracic;  Laterality: N/A;    REVIEW OF SYSTEMS:  A comprehensive review of systems was negative.   PHYSICAL EXAMINATION: General appearance: alert, cooperative and no distress Head: Normocephalic, without obvious abnormality, atraumatic Neck: no adenopathy, no JVD, supple, symmetrical, trachea midline and thyroid not enlarged, symmetric, no tenderness/mass/nodules Lymph nodes: Cervical, supraclavicular, and axillary nodes normal. Resp: clear to auscultation bilaterally Back: symmetric, no curvature. ROM normal. No CVA tenderness. Cardio: regular rate and rhythm, S1, S2 normal, no murmur, click, rub or gallop GI: soft, non-tender; bowel sounds normal; no masses,  no organomegaly Extremities: extremities normal, atraumatic,  no cyanosis or edema  ECOG PERFORMANCE STATUS: 1 - Symptomatic but completely ambulatory  Blood pressure (!) 136/57, pulse 73, temperature 98.4 F (36.9 C), temperature source Oral, resp. rate 18, height '5\' 1"'$  (1.549 m), weight 132 lb 9.6 oz (60.1 kg), SpO2 98 %.  LABORATORY DATA: Lab Results  Component Value Date   WBC 5.6 01/06/2016   HGB 12.6 01/06/2016   HCT 37.3 01/06/2016   MCV 83.8 01/06/2016   PLT 279 01/06/2016      Chemistry      Component Value Date/Time   NA 139 01/06/2016 1035   K 4.0 01/06/2016 1035   CL 103 12/10/2015 0758   CO2 22 01/06/2016 1035  BUN 16.3 01/06/2016 1035   CREATININE 0.8 01/06/2016 1035      Component Value Date/Time   CALCIUM 9.8 01/06/2016 1035   ALKPHOS 78 01/06/2016 1035   AST 18 01/06/2016 1035   ALT 21 01/06/2016 1035   BILITOT 0.39 01/06/2016 1035       RADIOGRAPHIC STUDIES: Dg Chest 2 View  Result Date: 12/10/2015 CLINICAL DATA:  Preoperative evaluation for lung biopsy EXAM: CHEST  2 VIEW COMPARISON:  Chest radiograph May 07, 2015; chest CT November 28, 2015; PET-CT December 06, 2015 FINDINGS: The known right perihilar region mass is not well appreciated by radiography. There is patchy atelectasis inferior to the right hilum medially. Lungs elsewhere clear. Heart size and pulmonary vascularity are normal. No adenopathy. No focal bone lesions evident. Postoperative changes noted in right shoulder. Calcified mass in the spleen noted by radiography, better seen by CT. IMPRESSION: No edema or consolidation. Medial right base atelectasis. The known nodular lesion in the right perihilar region is not well appreciated by radiography. Electronically Signed   By: Lowella Grip III M.D.   On: 12/10/2015 08:25   Mr Jeri Cos PP Contrast  Result Date: 12/26/2015 CLINICAL DATA:  82 year old female right lung adenocarcinoma. Staging. Subsequent encounter. EXAM: MRI HEAD WITHOUT AND WITH CONTRAST TECHNIQUE: Multiplanar, multiecho pulse  sequences of the brain and surrounding structures were obtained without and with intravenous contrast. CONTRAST:  17m MULTIHANCE GADOBENATE DIMEGLUMINE 529 MG/ML IV SOLN COMPARISON:  PET-CT 12/06/2015.  Brain MRI 07/03/2011. FINDINGS: No abnormal enhancement identified. No midline shift, mass effect, or evidence of intracranial mass lesion. No dural thickening. Visible bone marrow signal is within normal limits. Negative visualized cervical spine and spinal cord. Major intracranial vascular flow voids are stable with mild generalized intracranial artery dolichoectasia. No restricted diffusion to suggest acute infarction. No ventriculomegaly, extra-axial collection or acute intracranial hemorrhage. Cervicomedullary junction and pituitary are within normal limits. GPearline Cablesand white matter signal remains normal for age. Visible internal auditory structures appear normal. Visualized paranasal sinuses and mastoids are stable and well pneumatized. Interval postoperative changes to both globes. Negative scalp soft tissues. IMPRESSION: Stable since 2013 and normal for age MRI appearance of the brain. No metastatic disease or acute intracranial abnormality. Electronically Signed   By: HGenevie AnnM.D.   On: 12/26/2015 14:51   Dg Chest Port 1 View  Result Date: 12/10/2015 CLINICAL DATA:  Postop check EXAM: PORTABLE CHEST 1 VIEW COMPARISON:  12/10/2015 FINDINGS: Cardiac shadow is mildly enlarged but accentuated by the portable technique. Tortuous thoracic aorta is seen. Calcified splenic cyst is noted in the left upper quadrant. Mild vascular congestion is seen without interstitial edema. No focal infiltrate is noted. IMPRESSION: Mild vascular congestion without interstitial edema. Electronically Signed   By: MInez CatalinaM.D.   On: 12/10/2015 13:35    ASSESSMENT AND PLAN: This is a very pleasant 82years old white female recently diagnosed with unresectable stage IIA non-small cell lung cancer. The patient is here today to  start the first cycle of concurrent chemoradiation with weekly carboplatin and paclitaxel. She is feeling fine and has no complaints today. We will proceed with the first cycle today as a scheduled. The patient would come back for follow-up visit in 2 weeks for evaluation and close monitoring of her treatment. She was advised to call immediately if she has any concerning symptoms in the interval. The patient voices understanding of current disease status and treatment options and is in agreement with the current care plan.  All questions were answered. The patient knows to call the clinic with any problems, questions or concerns. We can certainly see the patient much sooner if necessary.  Disclaimer: This note was dictated with voice recognition software. Similar sounding words can inadvertently be transcribed and may not be corrected upon review.

## 2016-01-06 NOTE — Progress Notes (Signed)
Introduced myself as her FA.  Pt has 2 insurances so copay assistance is not needed.  Offered the [place] since she'll be coming everyday for radiation and went over what it covers and gave her an expense sheet.  She will think about it and get back in touch with me if she would like to apply.

## 2016-01-06 NOTE — Progress Notes (Signed)
Pt receiving first-time taxol/carbo today. Pt seems a little bit nervous, but receiving education well. Placed in-basket for first time f/u phone call. Pt did not have any reactions to the chemotherapy. VS remained stable throughout treatment. Pt asymptomatic. Provided education on potential S/S and times to call symptom management or come to the ED. Pt verbalized understanding. AVS printed.

## 2016-01-06 NOTE — Patient Instructions (Signed)
[facility] Discharge Instructions for Patients Receiving Chemotherapy  Today you received the following chemotherapy agents taxol and carboplatin.  To help prevent nausea and vomiting after your treatment, we encourage you to take your nausea medication as directed by your MD.   If you develop nausea and vomiting that is not controlled by your nausea medication, call the clinic.   BELOW ARE SYMPTOMS THAT SHOULD BE REPORTED IMMEDIATELY:  *FEVER GREATER THAN 100.5 F  *CHILLS WITH OR WITHOUT FEVER  NAUSEA AND VOMITING THAT IS NOT CONTROLLED WITH YOUR NAUSEA MEDICATION  *UNUSUAL SHORTNESS OF BREATH  *UNUSUAL BRUISING OR BLEEDING  TENDERNESS IN MOUTH AND THROAT WITH OR WITHOUT PRESENCE OF ULCERS  *URINARY PROBLEMS  *BOWEL PROBLEMS  UNUSUAL RASH Items with * indicate a potential emergency and should be followed up as soon as possible.  Feel free to call the clinic you have any questions or concerns. The clinic phone number is [phone].  Please show the [facility] at check-in to the Emergency Department and triage nurse.    Carboplatin injection What is this medicine? CARBOPLATIN (KAR boe pla tin) is a chemotherapy drug. It targets fast dividing cells, like cancer cells, and causes these cells to die. This medicine is used to treat ovarian cancer and many other cancers. This medicine may be used for other purposes; ask your health care provider or pharmacist if you have questions. What should I tell my health care provider before I take this medicine? They need to know if you have any of these conditions: -blood disorders -hearing problems -kidney disease -recent or ongoing radiation therapy -an unusual or allergic reaction to carboplatin, cisplatin, other chemotherapy, other medicines, foods, dyes, or preservatives -pregnant or trying to get pregnant -breast-feeding How should I use this medicine? This drug is usually given as an infusion into a  vein. It is administered in a hospital or clinic by a specially trained health care professional. Talk to your pediatrician regarding the use of this medicine in children. Special care may be needed. Overdosage: If you think you have taken too much of this medicine contact a poison control center or emergency room at once. NOTE: This medicine is only for you. Do not share this medicine with others. What if I miss a dose? It is important not to miss a dose. Call your doctor or health care professional if you are unable to keep an appointment. What may interact with this medicine? -medicines for seizures -medicines to increase blood counts like filgrastim, pegfilgrastim, sargramostim -some antibiotics like amikacin, gentamicin, neomycin, streptomycin, tobramycin -vaccines Talk to your doctor or health care professional before taking any of these medicines: -acetaminophen -aspirin -ibuprofen -ketoprofen -naproxen This list may not describe all possible interactions. Give your health care provider a list of all the medicines, herbs, non-prescription drugs, or dietary supplements you use. Also tell them if you smoke, drink alcohol, or use illegal drugs. Some items may interact with your medicine. What should I watch for while using this medicine? Your condition will be monitored carefully while you are receiving this medicine. You will need important blood work done while you are taking this medicine. This drug may make you feel generally unwell. This is not uncommon, as chemotherapy can affect healthy cells as well as cancer cells. Report any side effects. Continue your course of treatment even though you feel ill unless your doctor tells you to stop. In some cases, you may be given additional medicines to help with side effects.  Follow all directions for their use. Call your doctor or health care professional for advice if you get a fever, chills or sore throat, or other symptoms of a cold or flu.  Do not treat yourself. This drug decreases your body's ability to fight infections. Try to avoid being around people who are sick. This medicine may increase your risk to bruise or bleed. Call your doctor or health care professional if you notice any unusual bleeding. Be careful brushing and flossing your teeth or using a toothpick because you may get an infection or bleed more easily. If you have any dental work done, tell your dentist you are receiving this medicine. Avoid taking products that contain aspirin, acetaminophen, ibuprofen, naproxen, or ketoprofen unless instructed by your doctor. These medicines may hide a fever. Do not become pregnant while taking this medicine. Women should inform their doctor if they wish to become pregnant or think they might be pregnant. There is a potential for serious side effects to an unborn child. Talk to your health care professional or pharmacist for more information. Do not breast-feed an infant while taking this medicine. What side effects may I notice from receiving this medicine? Side effects that you should report to your doctor or health care professional as soon as possible: -allergic reactions like skin rash, itching or hives, swelling of the face, lips, or tongue -signs of infection - fever or chills, cough, sore throat, pain or difficulty passing urine -signs of decreased platelets or bleeding - bruising, pinpoint red spots on the skin, black, tarry stools, nosebleeds -signs of decreased red blood cells - unusually weak or tired, fainting spells, lightheadedness -breathing problems -changes in hearing -changes in vision -chest pain -high blood pressure -low blood counts - This drug may decrease the number of white blood cells, red blood cells and platelets. You may be at increased risk for infections and bleeding. -nausea and vomiting -pain, swelling, redness or irritation at the injection site -pain, tingling, numbness in the hands or  feet -problems with balance, talking, walking -trouble passing urine or change in the amount of urine Side effects that usually do not require medical attention (report to your doctor or health care professional if they continue or are bothersome): -hair loss -loss of appetite -metallic taste in the mouth or changes in taste This list may not describe all possible side effects. Call your doctor for medical advice about side effects. You may report side effects to FDA at 1-800-FDA-1088. Where should I keep my medicine? This drug is given in a hospital or clinic and will not be stored at home. NOTE: This sheet is a summary. It may not cover all possible information. If you have questions about this medicine, talk to your doctor, pharmacist, or health care provider.    2016, Elsevier/Gold Standard. (2007-07-19 14:38:05)   Paclitaxel injection What is this medicine? PACLITAXEL (PAK li TAX el) is a chemotherapy drug. It targets fast dividing cells, like cancer cells, and causes these cells to die. This medicine is used to treat ovarian cancer, breast cancer, and other cancers. This medicine may be used for other purposes; ask your health care provider or pharmacist if you have questions. What should I tell my health care provider before I take this medicine? They need to know if you have any of these conditions: -blood disorders -irregular heartbeat -infection (especially a virus infection such as chickenpox, cold sores, or herpes) -liver disease -previous or ongoing radiation therapy -an unusual or allergic reaction to paclitaxel,  alcohol, polyoxyethylated castor oil, other chemotherapy agents, other medicines, foods, dyes, or preservatives -pregnant or trying to get pregnant -breast-feeding How should I use this medicine? This drug is given as an infusion into a vein. It is administered in a hospital or clinic by a specially trained health care professional. Talk to your pediatrician  regarding the use of this medicine in children. Special care may be needed. Overdosage: If you think you have taken too much of this medicine contact a poison control center or emergency room at once. NOTE: This medicine is only for you. Do not share this medicine with others. What if I miss a dose? It is important not to miss your dose. Call your doctor or health care professional if you are unable to keep an appointment. What may interact with this medicine? Do not take this medicine with any of the following medications: -disulfiram -metronidazole This medicine may also interact with the following medications: -cyclosporine -diazepam -ketoconazole -medicines to increase blood counts like filgrastim, pegfilgrastim, sargramostim -other chemotherapy drugs like cisplatin, doxorubicin, epirubicin, etoposide, teniposide, vincristine -quinidine -testosterone -vaccines -verapamil Talk to your doctor or health care professional before taking any of these medicines: -acetaminophen -aspirin -ibuprofen -ketoprofen -naproxen This list may not describe all possible interactions. Give your health care provider a list of all the medicines, herbs, non-prescription drugs, or dietary supplements you use. Also tell them if you smoke, drink alcohol, or use illegal drugs. Some items may interact with your medicine. What should I watch for while using this medicine? Your condition will be monitored carefully while you are receiving this medicine. You will need important blood work done while you are taking this medicine. This drug may make you feel generally unwell. This is not uncommon, as chemotherapy can affect healthy cells as well as cancer cells. Report any side effects. Continue your course of treatment even though you feel ill unless your doctor tells you to stop. This medicine can cause serious allergic reactions. To reduce your risk you will need to take other medicine(s) before treatment with  this medicine. In some cases, you may be given additional medicines to help with side effects. Follow all directions for their use. Call your doctor or health care professional for advice if you get a fever, chills or sore throat, or other symptoms of a cold or flu. Do not treat yourself. This drug decreases your body's ability to fight infections. Try to avoid being around people who are sick. This medicine may increase your risk to bruise or bleed. Call your doctor or health care professional if you notice any unusual bleeding. Be careful brushing and flossing your teeth or using a toothpick because you may get an infection or bleed more easily. If you have any dental work done, tell your dentist you are receiving this medicine. Avoid taking products that contain aspirin, acetaminophen, ibuprofen, naproxen, or ketoprofen unless instructed by your doctor. These medicines may hide a fever. Do not become pregnant while taking this medicine. Women should inform their doctor if they wish to become pregnant or think they might be pregnant. There is a potential for serious side effects to an unborn child. Talk to your health care professional or pharmacist for more information. Do not breast-feed an infant while taking this medicine. Men are advised not to father a child while receiving this medicine. This product may contain alcohol. Ask your pharmacist or healthcare provider if this medicine contains alcohol. Be sure to tell all healthcare providers you are taking this  medicine. Certain medicines, like metronidazole and disulfiram, can cause an unpleasant reaction when taken with alcohol. The reaction includes flushing, headache, nausea, vomiting, sweating, and increased thirst. The reaction can last from 30 minutes to several hours. What side effects may I notice from receiving this medicine? Side effects that you should report to your doctor or health care professional as soon as possible: -allergic  reactions like skin rash, itching or hives, swelling of the face, lips, or tongue -low blood counts - This drug may decrease the number of white blood cells, red blood cells and platelets. You may be at increased risk for infections and bleeding. -signs of infection - fever or chills, cough, sore throat, pain or difficulty passing urine -signs of decreased platelets or bleeding - bruising, pinpoint red spots on the skin, black, tarry stools, nosebleeds -signs of decreased red blood cells - unusually weak or tired, fainting spells, lightheadedness -breathing problems -chest pain -high or low blood pressure -mouth sores -nausea and vomiting -pain, swelling, redness or irritation at the injection site -pain, tingling, numbness in the hands or feet -slow or irregular heartbeat -swelling of the ankle, feet, hands Side effects that usually do not require medical attention (report to your doctor or health care professional if they continue or are bothersome): -bone pain -complete hair loss including hair on your head, underarms, pubic hair, eyebrows, and eyelashes -changes in the color of fingernails -diarrhea -loosening of the fingernails -loss of appetite -muscle or joint pain -red flush to skin -sweating This list may not describe all possible side effects. Call your doctor for medical advice about side effects. You may report side effects to FDA at 1-800-FDA-1088. Where should I keep my medicine? This drug is given in a hospital or clinic and will not be stored at home. NOTE: This sheet is a summary. It may not cover all possible information. If you have questions about this medicine, talk to your doctor, pharmacist, or health care provider.    2016, Elsevier/Gold Standard. (2014-11-29 13:02:56)

## 2016-01-06 NOTE — Telephone Encounter (Signed)
Avs report and schd given per 01/06/16 los

## 2016-01-07 ENCOUNTER — Encounter: Payer: Self-pay | Admitting: Internal Medicine

## 2016-01-07 ENCOUNTER — Ambulatory Visit
Admission: RE | Admit: 2016-01-07 | Discharge: 2016-01-07 | Disposition: A | Discharge status: Home / Self Care | Payer: Medicare Other | Source: Ambulatory Visit | Attending: Radiation Oncology | Admitting: Radiation Oncology

## 2016-01-07 ENCOUNTER — Telehealth: Payer: Self-pay | Admitting: *Deleted

## 2016-01-07 ENCOUNTER — Encounter: Payer: Self-pay | Admitting: *Deleted

## 2016-01-07 DIAGNOSIS — Z510 Encounter for antineoplastic radiation therapy: Secondary | ICD-10-CM | POA: Diagnosis not present

## 2016-01-07 DIAGNOSIS — C3491 Malignant neoplasm of unspecified part of right bronchus or lung: Secondary | ICD-10-CM | POA: Diagnosis not present

## 2016-01-07 MED ORDER — ONETOUCH ULTRA 2 W/DEVICE KIT: PACK | 0 refills | Status: AC

## 2016-01-07 MED ORDER — GLUCOSE BLOOD VI STRP: 1.0000 | ORAL_STRIP | Freq: Two times a day (BID) | 3 refills | Status: DC

## 2016-01-07 MED ORDER — ONETOUCH ULTRASOFT LANCETS MISC
1.0000 | Freq: Two times a day (BID) | 3 refills | Status: DC
Start: 2016-01-07 — End: 2016-01-09

## 2016-01-07 NOTE — Progress Notes (Signed)
Pt is approved for the $400 [facility] grant.  °

## 2016-01-07 NOTE — Progress Notes (Signed)
Oncology Nurse Navigator Documentation  Oncology Nurse Navigator Flowsheets 01/07/2016  Navigator Encounter Type Other/update oncology HX in EPIC  Treatment Phase Treatment  Barriers/Navigation Needs Coordination of Care  Interventions Coordination of Care  Coordination of Care Other  Acuity Level 2  Acuity Level 2 Other  Time Spent with Patient 30

## 2016-01-07 NOTE — Telephone Encounter (Signed)
Rec'd call pt states she is needing a new BS monitor. The one she has is old and not reading right. Inform pt we can give her a sample kit if she can pick-up, but pt states its would be convience  for her if she can just get from her pharmacy. Verified pharmacy inform pt will send to [facility]...Johny Chess

## 2016-01-08 ENCOUNTER — Telehealth: Payer: Self-pay | Admitting: Medical Oncology

## 2016-01-08 ENCOUNTER — Ambulatory Visit
Admission: RE | Admit: 2016-01-08 | Discharge: 2016-01-08 | Disposition: A | Discharge status: Home / Self Care | Payer: Medicare Other | Source: Ambulatory Visit | Attending: Radiation Oncology | Admitting: Radiation Oncology

## 2016-01-08 DIAGNOSIS — C3491 Malignant neoplasm of unspecified part of right bronchus or lung: Secondary | ICD-10-CM | POA: Diagnosis not present

## 2016-01-08 DIAGNOSIS — Z510 Encounter for antineoplastic radiation therapy: Secondary | ICD-10-CM | POA: Diagnosis not present

## 2016-01-08 MED ORDER — RADIAPLEXRX EX GEL
Freq: Once | CUTANEOUS | Status: AC
  Administered 2016-01-08: 14:00:00 via TOPICAL

## 2016-01-08 NOTE — Progress Notes (Signed)
Seen patient for post sim education following treatment. She requested this RN inform Abelina Bachelor, RN that she is doing well without nausea, vomiting or any other complaints.

## 2016-01-08 NOTE — Telephone Encounter (Signed)
*  I left message for pt to call if she is experiencing any problems after her chemo or has any concern, or questions.

## 2016-01-09 ENCOUNTER — Other Ambulatory Visit: Payer: Self-pay | Admitting: Emergency Medicine

## 2016-01-09 ENCOUNTER — Ambulatory Visit
Admission: RE | Admit: 2016-01-09 | Discharge: 2016-01-09 | Disposition: A | Discharge status: Home / Self Care | Payer: Medicare Other | Source: Ambulatory Visit | Attending: Radiation Oncology | Admitting: Radiation Oncology

## 2016-01-09 DIAGNOSIS — C3491 Malignant neoplasm of unspecified part of right bronchus or lung: Secondary | ICD-10-CM | POA: Diagnosis not present

## 2016-01-09 DIAGNOSIS — Z510 Encounter for antineoplastic radiation therapy: Secondary | ICD-10-CM | POA: Diagnosis not present

## 2016-01-09 MED ORDER — ONETOUCH ULTRASOFT LANCETS MISC: 1.0000 | Freq: Two times a day (BID) | 3 refills | Status: DC

## 2016-01-10 ENCOUNTER — Ambulatory Visit
Admission: RE | Admit: 2016-01-10 | Discharge: 2016-01-10 | Disposition: A | Discharge status: Home / Self Care | Payer: Medicare Other | Source: Ambulatory Visit | Attending: Radiation Oncology | Admitting: Radiation Oncology

## 2016-01-10 VITALS — BP 128/58 | HR 72 | Resp 16 | Wt 132.5 lb

## 2016-01-10 DIAGNOSIS — C3491 Malignant neoplasm of unspecified part of right bronchus or lung: Secondary | ICD-10-CM | POA: Diagnosis not present

## 2016-01-10 DIAGNOSIS — Z510 Encounter for antineoplastic radiation therapy: Secondary | ICD-10-CM | POA: Diagnosis not present

## 2016-01-10 MED ORDER — ALRA NON-METALLIC DEODORANT (RAD-ONC)
1.0000 "application " | Freq: Once | TOPICAL | Status: AC
  Administered 2016-01-10: 1 via TOPICAL

## 2016-01-10 NOTE — Addendum Note (Signed)
Encounter addended by: Heywood Footman, RN on: 01/10/2016  3:58 PM<BR>    Actions taken: Order Entry activity accessed, Diagnosis association updated, MAR administration accepted

## 2016-01-10 NOTE — Progress Notes (Signed)
  Radiation Oncology         [id number]   Name: Samantha Quinn MRN: [id number]   Date: 01/10/2016  DOB: 06-19-1927   Weekly Radiation Therapy Management    ICD-9-CM ICD-10-CM   1. Primary adenocarcinoma of lung, right (HCC) 162.9 C34.91     Current Dose: 10 Gy  Planned Dose:  66 Gy  Narrative The patient presents for routine under treatment assessment.  Denies pain, difficulty associated with swallowing, shortness of breath, skin changes in the treatment field, or fatigue. Reports a chronic dry cough that is no worse since starting treatment. Reports using radiaplex as directed.  Set-up films were reviewed. The chart was checked.  Physical Findings  weight is 132 lb 8 oz (60.1 kg). Her blood pressure is 128/58 (abnormal) and her pulse is 72. Her respiration is 16 and oxygen saturation is 99%. . Weight essentially stable.  No significant changes. Alert and oriented x3. In no acute distress.  Impression The patient is tolerating radiation.  Plan Continue treatment as planned. I informed the patient she may develop pain/difficulty swallowing as treatment progresses.     Sheral Apley Tammi Klippel, M.D.  This document serves as a record of services personally performed by Tyler Pita, MD. It was created on his behalf by Darcus Austin, a trained medical scribe. The creation of this record is based on the scribe's personal observations and the provider's statements to them. This document has been checked and approved by the attending provider.

## 2016-01-10 NOTE — Progress Notes (Signed)
Weight and vitals stable. Denies pain. Denies pain or difficulty associated with swallowing. Denies shortness of breath. Reports a chronic dry cough that is no worse since starting treatment. Denies skin changes within treatment field. Reports using radiaplex as directed. Denies fatigue.   BP (!) 128/58 (BP Location: Left Arm, Patient Position: Sitting, Cuff Size: Normal)   Pulse 72   Resp 16   Wt 132 lb 8 oz (60.1 kg)   SpO2 99%   BMI 25.04 kg/m  Wt Readings from Last 3 Encounters:  01/10/16 132 lb 8 oz (60.1 kg)  01/06/16 132 lb 9.6 oz (60.1 kg)  12/19/15 130 lb 8 oz (59.2 kg)

## 2016-01-13 ENCOUNTER — Other Ambulatory Visit (HOSPITAL_BASED_OUTPATIENT_CLINIC_OR_DEPARTMENT_OTHER): Payer: Medicare Other

## 2016-01-13 ENCOUNTER — Ambulatory Visit (HOSPITAL_BASED_OUTPATIENT_CLINIC_OR_DEPARTMENT_OTHER): Payer: Medicare Other

## 2016-01-13 ENCOUNTER — Ambulatory Visit
Admission: RE | Admit: 2016-01-13 | Discharge: 2016-01-13 | Disposition: A | Discharge status: Home / Self Care | Payer: Medicare Other | Source: Ambulatory Visit | Attending: Radiation Oncology | Admitting: Radiation Oncology

## 2016-01-13 VITALS — BP 133/73 | HR 78 | Temp 98.1°F | Resp 17

## 2016-01-13 DIAGNOSIS — C3491 Malignant neoplasm of unspecified part of right bronchus or lung: Secondary | ICD-10-CM | POA: Diagnosis not present

## 2016-01-13 DIAGNOSIS — Z510 Encounter for antineoplastic radiation therapy: Secondary | ICD-10-CM | POA: Diagnosis not present

## 2016-01-13 DIAGNOSIS — Z5111 Encounter for antineoplastic chemotherapy: Secondary | ICD-10-CM | POA: Diagnosis not present

## 2016-01-13 LAB — CBC WITH DIFFERENTIAL/PLATELET
BASO%: 0.6 % (ref 0.0–2.0)
Basophils Absolute: 0 10*3/uL (ref 0.0–0.1)
EOS%: 2.2 % (ref 0.0–7.0)
Eosinophils Absolute: 0.1 10*3/uL (ref 0.0–0.5)
HCT: 37.3 % (ref 34.8–46.6)
HGB: 12.4 g/dL (ref 11.6–15.9)
LYMPH%: 17.8 % (ref 14.0–49.7)
MCH: 28.2 pg (ref 25.1–34.0)
MCHC: 33.1 g/dL (ref 31.5–36.0)
MCV: 85 fL (ref 79.5–101.0)
MONO#: 0.3 10*3/uL (ref 0.1–0.9)
MONO%: 5.2 % (ref 0.0–14.0)
NEUT#: 3.6 10*3/uL (ref 1.5–6.5)
NEUT%: 74.2 % (ref 38.4–76.8)
Platelets: 268 10*3/uL (ref 145–400)
RBC: 4.39 10*6/uL (ref 3.70–5.45)
RDW: 13.8 % (ref 11.2–14.5)
WBC: 4.9 10*3/uL (ref 3.9–10.3)
lymph#: 0.9 10*3/uL (ref 0.9–3.3)

## 2016-01-13 LAB — COMPREHENSIVE METABOLIC PANEL
ALBUMIN: 3.6 g/dL (ref 3.5–5.0)
ALK PHOS: 74 U/L (ref 40–150)
ALT: 21 U/L (ref 0–55)
AST: 19 U/L (ref 5–34)
Anion Gap: 11 mEq/L (ref 3–11)
BILIRUBIN TOTAL: 0.39 mg/dL (ref 0.20–1.20)
BUN: 16.6 mg/dL (ref 7.0–26.0)
CO2: 23 mEq/L (ref 22–29)
CREATININE: 0.7 mg/dL (ref 0.6–1.1)
Calcium: 9.6 mg/dL (ref 8.4–10.4)
Chloride: 104 mEq/L (ref 98–109)
EGFR: 77 mL/min/{1.73_m2} — AB (ref >90)
GLUCOSE: 93 mg/dL (ref 70–140)
Potassium: 3.9 mEq/L (ref 3.5–5.1)
SODIUM: 139 meq/L (ref 136–145)
TOTAL PROTEIN: 7 g/dL (ref 6.4–8.3)

## 2016-01-13 MED ORDER — SODIUM CHLORIDE 0.9 % IV SOLN
45.0000 mg/m2 | Freq: Once | INTRAVENOUS | Status: AC
  Administered 2016-01-13: 72 mg via INTRAVENOUS
  Filled 2016-01-13: qty 12

## 2016-01-13 MED ORDER — FAMOTIDINE IN NACL 20-0.9 MG/50ML-% IV SOLN
20.0000 mg | Freq: Once | INTRAVENOUS | Status: AC
  Administered 2016-01-13: 20 mg via INTRAVENOUS

## 2016-01-13 MED ORDER — DIPHENHYDRAMINE HCL 50 MG/ML IJ SOLN
50.0000 mg | Freq: Once | INTRAMUSCULAR | Status: AC
  Administered 2016-01-13: 50 mg via INTRAVENOUS

## 2016-01-13 MED ORDER — FAMOTIDINE IN NACL 20-0.9 MG/50ML-% IV SOLN
INTRAVENOUS | Status: AC
  Filled 2016-01-13: qty 50

## 2016-01-13 MED ORDER — SODIUM CHLORIDE 0.9 % IV SOLN
20.0000 mg | Freq: Once | INTRAVENOUS | Status: AC
  Administered 2016-01-13: 20 mg via INTRAVENOUS
  Filled 2016-01-13: qty 2

## 2016-01-13 MED ORDER — PALONOSETRON HCL INJECTION 0.25 MG/5ML
0.2500 mg | Freq: Once | INTRAVENOUS | Status: AC
  Administered 2016-01-13: 0.25 mg via INTRAVENOUS

## 2016-01-13 MED ORDER — SODIUM CHLORIDE 0.9 % IV SOLN
Freq: Once | INTRAVENOUS | Status: AC
  Administered 2016-01-13: 15:00:00 via INTRAVENOUS

## 2016-01-13 MED ORDER — PALONOSETRON HCL INJECTION 0.25 MG/5ML
INTRAVENOUS | Status: AC
  Filled 2016-01-13: qty 5

## 2016-01-13 MED ORDER — DIPHENHYDRAMINE HCL 50 MG/ML IJ SOLN
INTRAMUSCULAR | Status: AC
  Filled 2016-01-13: qty 1

## 2016-01-13 MED ORDER — SODIUM CHLORIDE 0.9 % IV SOLN
126.8000 mg | Freq: Once | INTRAVENOUS | Status: AC
  Administered 2016-01-13: 130 mg via INTRAVENOUS
  Filled 2016-01-13: qty 13

## 2016-01-13 NOTE — Patient Instructions (Signed)
[facility] Discharge Instructions for Patients Receiving Chemotherapy  Today you received the following chemotherapy agents taxol and carboplatin. To help prevent nausea and vomiting after your treatment, we encourage you to take your nausea medication as directed. If you develop nausea and vomiting that is not controlled by your nausea medication, call the clinic.   BELOW ARE SYMPTOMS THAT SHOULD BE REPORTED IMMEDIATELY:  *FEVER GREATER THAN 100.5 F  *CHILLS WITH OR WITHOUT FEVER  NAUSEA AND VOMITING THAT IS NOT CONTROLLED WITH YOUR NAUSEA MEDICATION  *UNUSUAL SHORTNESS OF BREATH  *UNUSUAL BRUISING OR BLEEDING  TENDERNESS IN MOUTH AND THROAT WITH OR WITHOUT PRESENCE OF ULCERS  *URINARY PROBLEMS  *BOWEL PROBLEMS  UNUSUAL RASH Items with * indicate a potential emergency and should be followed up as soon as possible.  Feel free to call the clinic you have any questions or concerns. The clinic phone number is [phone].  Please show the [facility] at check-in to the Emergency Department and triage nurse.

## 2016-01-14 ENCOUNTER — Ambulatory Visit
Admission: RE | Admit: 2016-01-14 | Discharge: 2016-01-14 | Disposition: A | Discharge status: Home / Self Care | Payer: Medicare Other | Source: Ambulatory Visit | Attending: Radiation Oncology | Admitting: Radiation Oncology

## 2016-01-14 ENCOUNTER — Other Ambulatory Visit: Payer: Self-pay | Admitting: Emergency Medicine

## 2016-01-14 DIAGNOSIS — Z510 Encounter for antineoplastic radiation therapy: Secondary | ICD-10-CM | POA: Diagnosis not present

## 2016-01-14 DIAGNOSIS — C3491 Malignant neoplasm of unspecified part of right bronchus or lung: Secondary | ICD-10-CM | POA: Diagnosis not present

## 2016-01-14 MED ORDER — ONETOUCH DELICA LANCETS FINE MISC: 3 refills | Status: DC

## 2016-01-15 ENCOUNTER — Ambulatory Visit
Admission: RE | Admit: 2016-01-15 | Discharge: 2016-01-15 | Disposition: A | Discharge status: Home / Self Care | Payer: Medicare Other | Source: Ambulatory Visit | Attending: Radiation Oncology | Admitting: Radiation Oncology

## 2016-01-15 DIAGNOSIS — Z510 Encounter for antineoplastic radiation therapy: Secondary | ICD-10-CM | POA: Diagnosis not present

## 2016-01-15 DIAGNOSIS — C3491 Malignant neoplasm of unspecified part of right bronchus or lung: Secondary | ICD-10-CM | POA: Diagnosis not present

## 2016-01-16 ENCOUNTER — Ambulatory Visit
Admission: RE | Admit: 2016-01-16 | Discharge: 2016-01-16 | Disposition: A | Discharge status: Home / Self Care | Payer: Medicare Other | Source: Ambulatory Visit | Attending: Radiation Oncology | Admitting: Radiation Oncology

## 2016-01-16 DIAGNOSIS — C3491 Malignant neoplasm of unspecified part of right bronchus or lung: Secondary | ICD-10-CM | POA: Diagnosis not present

## 2016-01-16 DIAGNOSIS — Z510 Encounter for antineoplastic radiation therapy: Secondary | ICD-10-CM | POA: Diagnosis not present

## 2016-01-17 ENCOUNTER — Encounter: Payer: Self-pay | Admitting: Radiation Oncology

## 2016-01-17 ENCOUNTER — Ambulatory Visit
Admission: RE | Admit: 2016-01-17 | Discharge: 2016-01-17 | Disposition: A | Discharge status: Home / Self Care | Payer: Medicare Other | Source: Ambulatory Visit | Attending: Radiation Oncology | Admitting: Radiation Oncology

## 2016-01-17 VITALS — BP 143/73 | HR 73 | Resp 16 | Wt 131.1 lb

## 2016-01-17 DIAGNOSIS — Z510 Encounter for antineoplastic radiation therapy: Secondary | ICD-10-CM | POA: Diagnosis not present

## 2016-01-17 DIAGNOSIS — C3491 Malignant neoplasm of unspecified part of right bronchus or lung: Secondary | ICD-10-CM | POA: Diagnosis not present

## 2016-01-17 NOTE — Progress Notes (Signed)
Weight and vitals stable. Denies pain. Denies pain or difficulty associated with swallowing. Denies shortness of breath. Reports a chronic dry cough that is no worse since the start of treatment. Denies skin changes within the treatment field. Reports using radiaplex as directed. Denies fatigue.   BP (!) 143/73 (BP Location: Left Arm, Patient Position: Sitting, Cuff Size: Normal)   Pulse 73   Resp 16   Wt 131 lb 1.6 oz (59.5 kg)   SpO2 100%   BMI 24.77 kg/m  Wt Readings from Last 3 Encounters:  01/17/16 131 lb 1.6 oz (59.5 kg)  01/10/16 132 lb 8 oz (60.1 kg)  01/06/16 132 lb 9.6 oz (60.1 kg)

## 2016-01-17 NOTE — Progress Notes (Signed)
Department of Radiation Oncology  Phone:  [phone] Fax:        [phone]  Weekly Treatment Note    Name: Samantha Quinn Date: 01/17/2016 MRN: [id number] DOB: [birth year]   Diagnosis:  No diagnosis found.   Current dose: 20 Gy  Current fraction: 10   MEDICATIONS: Current Outpatient Prescriptions  Medication Sig Dispense Refill  . amitriptyline (ELAVIL) 10 MG tablet Take 1 tablet (10 mg total) by mouth at bedtime as needed. (Patient taking differently: Take 5 mg by mouth at bedtime. ) 90 tablet 1  . bisoprolol-hydrochlorothiazide (ZIAC) 2.5-6.25 MG tablet Take 1 tablet by mouth daily. 90 tablet 3  . Blood Glucose Monitoring Suppl (ONE TOUCH ULTRA 2) w/Device KIT Use to check blood sugars twice a day Dx E11.9 1 each 0  . calcium carbonate (OS-CAL) 600 MG tablet Take 600 mg by mouth daily.    . Calcium Carbonate-Vit D-Min (CALCIUM-VITAMIN D-MINERALS) 600-800 MG-UNIT CHEW Chew by mouth daily.    [name] gabapentin (NEURONTIN) 100 MG capsule Take 200 mg by mouth 3 (three) times daily.     [name] glucose blood (ONE TOUCH ULTRA TEST) test strip 1 each by Other route 2 (two) times daily. Use to check blood sugars twice a day Dx E11.9 100 each 3  . ibuprofen (ADVIL,MOTRIN) 200 MG tablet Take 200 mg by mouth every 6 (six) hours as needed for fever, headache or mild pain.     [name] levothyroxine (SYNTHROID, LEVOTHROID) 25 MCG tablet Take 1 tablet (25 mcg total) by mouth daily before breakfast. 90 tablet 1  . lovastatin (MEVACOR) 20 MG tablet Take 1 tablet (20 mg total) by mouth every other day. 90 tablet 1  . meclizine (ANTIVERT) 25 MG tablet Take 25 mg by mouth 3 (three) times daily as needed for dizziness.    . metFORMIN (GLUCOPHAGE) 500 MG tablet Take 1 tablet (500 mg total) by mouth 2 (two) times daily with a meal. 180 tablet 3  . non-metallic deodorant (ALRA) MISC Apply 1 application topically daily as needed.    Glory Rosebush DELICA LANCETS FINE MISC Use one lancet to check blood sugar twice  daily 100 each 3  . prochlorperazine (COMPAZINE) 10 MG tablet Take 1 tablet (10 mg total) by mouth every 6 (six) hours as needed for nausea or vomiting. 30 tablet 0  . ramipril (ALTACE) 2.5 MG capsule Take 1 capsule (2.5 mg total) by mouth daily. 90 capsule 2  . Wound Cleansers (RADIAPLEX EX) Apply topically.     No current facility-administered medications for this encounter.      ALLERGIES: Codeine; Tramadol; and Demerol   LABORATORY DATA:  Lab Results  Component Value Date   WBC 4.9 01/13/2016   HGB 12.4 01/13/2016   HCT 37.3 01/13/2016   MCV 85.0 01/13/2016   PLT 268 01/13/2016   Lab Results  Component Value Date   NA 139 01/13/2016   K 3.9 01/13/2016   CL 103 12/10/2015   CO2 23 01/13/2016   Lab Results  Component Value Date   ALT 21 01/13/2016   AST 19 01/13/2016   ALKPHOS 74 01/13/2016   BILITOT 0.39 01/13/2016     NARRATIVE: Samantha Quinn was seen today for weekly treatment management. The chart was checked and the patient's films were reviewed.  Weight and vitals stable. Denies pain. Denies pain or difficulty associated with swallowing. Denies SOB. Reports chronic dry cough that is no worse since the start of treatment. Denies skin changes within the treatment  field. Uses Radiaplex as directed. Denies fatigue.  PHYSICAL EXAMINATION: weight is 131 lb 1.6 oz (59.5 kg). Her blood pressure is 143/73 (abnormal) and her pulse is 73. Her respiration is 16 and oxygen saturation is 100%.        ASSESSMENT: The patient is doing satisfactorily with treatment.  PLAN: We will continue with the patient's radiation treatment as planned.        This document serves as a record of services personally performed by Kyung Rudd, MD. It was created on his behalf by Bethann Humble, a trained medical scribe. The creation of this record is based on the scribe's personal observations and the provider's statements to them. This document has been checked and approved by the attending  provider.

## 2016-01-20 ENCOUNTER — Other Ambulatory Visit: Payer: Medicare Other

## 2016-01-20 ENCOUNTER — Encounter: Payer: Self-pay | Admitting: Internal Medicine

## 2016-01-20 ENCOUNTER — Other Ambulatory Visit (HOSPITAL_BASED_OUTPATIENT_CLINIC_OR_DEPARTMENT_OTHER): Payer: Medicare Other

## 2016-01-20 ENCOUNTER — Encounter: Payer: Self-pay | Admitting: *Deleted

## 2016-01-20 ENCOUNTER — Other Ambulatory Visit: Payer: Self-pay

## 2016-01-20 ENCOUNTER — Ambulatory Visit (HOSPITAL_BASED_OUTPATIENT_CLINIC_OR_DEPARTMENT_OTHER): Payer: Medicare Other

## 2016-01-20 ENCOUNTER — Ambulatory Visit (HOSPITAL_BASED_OUTPATIENT_CLINIC_OR_DEPARTMENT_OTHER): Payer: Medicare Other | Admitting: Internal Medicine

## 2016-01-20 ENCOUNTER — Ambulatory Visit
Admission: RE | Admit: 2016-01-20 | Discharge: 2016-01-20 | Disposition: A | Discharge status: Home / Self Care | Payer: Medicare Other | Source: Ambulatory Visit | Attending: Radiation Oncology | Admitting: Radiation Oncology

## 2016-01-20 ENCOUNTER — Telehealth: Payer: Self-pay | Admitting: Internal Medicine

## 2016-01-20 VITALS — BP 143/55 | HR 81 | Temp 98.4°F | Resp 16 | Ht 61.0 in | Wt 130.9 lb

## 2016-01-20 DIAGNOSIS — R1310 Dysphagia, unspecified: Secondary | ICD-10-CM

## 2016-01-20 DIAGNOSIS — Z5111 Encounter for antineoplastic chemotherapy: Secondary | ICD-10-CM

## 2016-01-20 DIAGNOSIS — C3491 Malignant neoplasm of unspecified part of right bronchus or lung: Secondary | ICD-10-CM

## 2016-01-20 DIAGNOSIS — C3431 Malignant neoplasm of lower lobe, right bronchus or lung: Secondary | ICD-10-CM

## 2016-01-20 DIAGNOSIS — Z510 Encounter for antineoplastic radiation therapy: Secondary | ICD-10-CM | POA: Diagnosis not present

## 2016-01-20 LAB — COMPREHENSIVE METABOLIC PANEL
ALK PHOS: 74 U/L (ref 40–150)
ALT: 21 U/L (ref 0–55)
ANION GAP: 10 meq/L (ref 3–11)
AST: 16 U/L (ref 5–34)
Albumin: 3.5 g/dL (ref 3.5–5.0)
BUN: 17.6 mg/dL (ref 7.0–26.0)
CALCIUM: 9 mg/dL (ref 8.4–10.4)
CHLORIDE: 106 meq/L (ref 98–109)
CO2: 23 mEq/L (ref 22–29)
Creatinine: 0.7 mg/dL (ref 0.6–1.1)
EGFR: 78 mL/min/{1.73_m2} — AB (ref >90)
Glucose: 99 mg/dl (ref 70–140)
POTASSIUM: 3.9 meq/L (ref 3.5–5.1)
Sodium: 139 mEq/L (ref 136–145)
Total Bilirubin: 0.34 mg/dL (ref 0.20–1.20)
Total Protein: 6.9 g/dL (ref 6.4–8.3)

## 2016-01-20 LAB — CBC WITH DIFFERENTIAL/PLATELET
BASO%: 0.8 % (ref 0.0–2.0)
BASOS ABS: 0 10*3/uL (ref 0.0–0.1)
EOS%: 1.1 % (ref 0.0–7.0)
Eosinophils Absolute: 0 10*3/uL (ref 0.0–0.5)
HEMATOCRIT: 36.1 % (ref 34.8–46.6)
HGB: 12 g/dL (ref 11.6–15.9)
LYMPH#: 0.5 10*3/uL — AB (ref 0.9–3.3)
LYMPH%: 15.6 % (ref 14.0–49.7)
MCH: 28.1 pg (ref 25.1–34.0)
MCHC: 33.3 g/dL (ref 31.5–36.0)
MCV: 84.6 fL (ref 79.5–101.0)
MONO#: 0.2 10*3/uL (ref 0.1–0.9)
MONO%: 7.2 % (ref 0.0–14.0)
NEUT#: 2.6 10*3/uL (ref 1.5–6.5)
NEUT%: 75.3 % (ref 38.4–76.8)
PLATELETS: 217 10*3/uL (ref 145–400)
RBC: 4.27 10*6/uL (ref 3.70–5.45)
RDW: 13.9 % (ref 11.2–14.5)
WBC: 3.5 10*3/uL — ABNORMAL LOW (ref 3.9–10.3)

## 2016-01-20 MED ORDER — PALONOSETRON HCL INJECTION 0.25 MG/5ML
INTRAVENOUS | Status: AC
  Filled 2016-01-20: qty 5

## 2016-01-20 MED ORDER — SODIUM CHLORIDE 0.9 % IV SOLN
45.0000 mg/m2 | Freq: Once | INTRAVENOUS | Status: AC
  Administered 2016-01-20: 72 mg via INTRAVENOUS
  Filled 2016-01-20: qty 12

## 2016-01-20 MED ORDER — SODIUM CHLORIDE 0.9 % IV SOLN
20.0000 mg | Freq: Once | INTRAVENOUS | Status: AC
  Administered 2016-01-20: 20 mg via INTRAVENOUS
  Filled 2016-01-20: qty 2

## 2016-01-20 MED ORDER — DIPHENHYDRAMINE HCL 50 MG/ML IJ SOLN
50.0000 mg | Freq: Once | INTRAMUSCULAR | Status: AC
  Administered 2016-01-20: 50 mg via INTRAVENOUS

## 2016-01-20 MED ORDER — DIPHENHYDRAMINE HCL 50 MG/ML IJ SOLN
INTRAMUSCULAR | Status: AC
  Filled 2016-01-20: qty 1

## 2016-01-20 MED ORDER — SODIUM CHLORIDE 0.9 % IV SOLN
Freq: Once | INTRAVENOUS | Status: AC
  Administered 2016-01-20: 13:00:00 via INTRAVENOUS

## 2016-01-20 MED ORDER — FAMOTIDINE IN NACL 20-0.9 MG/50ML-% IV SOLN
20.0000 mg | Freq: Once | INTRAVENOUS | Status: AC
  Administered 2016-01-20: 20 mg via INTRAVENOUS

## 2016-01-20 MED ORDER — PALONOSETRON HCL INJECTION 0.25 MG/5ML
0.2500 mg | Freq: Once | INTRAVENOUS | Status: AC
  Administered 2016-01-20: 0.25 mg via INTRAVENOUS

## 2016-01-20 MED ORDER — SODIUM CHLORIDE 0.9 % IV SOLN
126.8000 mg | Freq: Once | INTRAVENOUS | Status: AC
  Administered 2016-01-20: 130 mg via INTRAVENOUS
  Filled 2016-01-20: qty 13

## 2016-01-20 MED ORDER — FAMOTIDINE IN NACL 20-0.9 MG/50ML-% IV SOLN
INTRAVENOUS | Status: AC
  Filled 2016-01-20: qty 50

## 2016-01-20 NOTE — Telephone Encounter (Signed)
Patient already on schedule for lab/chemo wkly and f/u q2w per 9/25 los. Gave patient avs report and appointments for October

## 2016-01-20 NOTE — Patient Instructions (Signed)
[facility] for Patients Receiving Chemotherapy  Today you received the following chemotherapy agents Taxol/Carboplatin  To help prevent nausea and vomiting after your treatment, we encourage you to take your nausea medication    If you develop nausea and vomiting that is not controlled by your nausea medication, call the clinic.   BELOW ARE SYMPTOMS THAT SHOULD BE REPORTED IMMEDIATELY:  *FEVER GREATER THAN 100.5 F  *CHILLS WITH OR WITHOUT FEVER  NAUSEA AND VOMITING THAT IS NOT CONTROLLED WITH YOUR NAUSEA MEDICATION  *UNUSUAL SHORTNESS OF BREATH  *UNUSUAL BRUISING OR BLEEDING  TENDERNESS IN MOUTH AND THROAT WITH OR WITHOUT PRESENCE OF ULCERS  *URINARY PROBLEMS  *BOWEL PROBLEMS  UNUSUAL RASH Items with * indicate a potential emergency and should be followed up as soon as possible.  Feel free to call the clinic you have any questions or concerns. The clinic phone number is [phone].  Please show the CHEMO ALERT CARD at check-in to the Emergency Department and triage nurse.   

## 2016-01-20 NOTE — Progress Notes (Signed)
[facility] [phone]   [phone]  OFFICE PROGRESS NOTE  Binnie Rail, MD [place] [address]  DIAGNOSIS:  recurrent non-small cell lung cancer, stage IIA (T1a, N1, M0) non-small cell lung cancer, adenocarcinoma presented with perihilar right lower lobe soft tissue mass with right hilar adenopathy diagnosed in August 2017. The patient has a history of stage IB (T2a, N0, M0) non-small cell lung cancer, adenocarcinoma diagnosed in March 2013.  PRIOR THERAPY: Status post right upper lobectomy with lymph node dissection. The tumor was negative for EGFR and ALK mutations.  CURRENT THERAPY: A course of concurrent chemoradiation with weekly carboplatin for AUC of 2 and paclitaxel 45 MG/M2. Status post 2 cycles.  INTERVAL HISTORY: Samantha Quinn 82 y.o. female returns to the clinic today for follow-up visit accompanied by her daughter. The patient is feeling fine today with no specific complaints. She is tolerating her concurrent chemoradiation fairly well was no significant complaints. She status post 2 cycles. She denied having any significant chest pain, shortness of breath, cough or hemoptysis. She has no nausea or vomiting. She denied having any significant weight loss or night sweats. She had mild odynophagia. She is here today to start cycle #3.  MEDICAL HISTORY: Past Medical History:  Diagnosis Date  . Back pain    bulding disc  . Bronchitis    hx of-many,many years   . Diabetes mellitus   . Diabetes mellitus    type 2 and takes Metformin bid  . Dyslipidemia   . Embolism - blood clot    hx of in left lower leg and was on Coumadin for about 62month;this was about 8-166yrago  . Family history of adverse reaction to anesthesia    Daughter- Nausea  . History of colonic polyps   . HOH (hard of hearing)   . Hx of migraines    as a teenager  . Hypercholesterolemia   . Hyperlipidemia    takes Lovastatin every other day  .  Hypertension    takes Ziac daily and Ramipril as well  . Hypothyroidism    takes Synthroid every other day  . Insomnia    takes Elavil nightly  . Joint pain   . Lung mass   . Myalgia   . Osteoarthritis   . Osteoporosis   . Pneumothorax of right lung after biopsy 07/09/11   HAD CHEST TUBE PLACEMENT  . PONV (postoperative nausea and vomiting)   . [facility] of [facility] ([facility]  . Shortness of breath dyspnea    With exertion  . Skin cancer    legs have dark spots on them  . Upper respiratory infection 04/2011  . Urinary incontinence    takes Detrol daily prn    ALLERGIES:  is allergic to codeine; tramadol; and demerol.  MEDICATIONS:  Current Outpatient Prescriptions  Medication Sig Dispense Refill  . amitriptyline (ELAVIL) 10 MG tablet Take 1 tablet (10 mg total) by mouth at bedtime as needed. (Patient taking differently: Take 5 mg by mouth at bedtime. ) 90 tablet 1  . bisoprolol-hydrochlorothiazide (ZIAC) 2.5-6.25 MG tablet Take 1 tablet by mouth daily. 90 tablet 3  . Blood Glucose Monitoring Suppl (ONE TOUCH ULTRA 2) w/Device KIT Use to check blood sugars twice a day Dx E11.9 1 each 0  . calcium carbonate (OS-CAL) 600 MG tablet Take 600 mg by mouth daily.    . Calcium Carbonate-Vit D-Min (CALCIUM-VITAMIN D-MINERALS) 600-800 MG-UNIT CHEW Chew  by mouth daily.    [name] gabapentin (NEURONTIN) 100 MG capsule Take 200 mg by mouth 3 (three) times daily.     [name] glucose blood (ONE TOUCH ULTRA TEST) test strip 1 each by Other route 2 (two) times daily. Use to check blood sugars twice a day Dx E11.9 100 each 3  . ibuprofen (ADVIL,MOTRIN) 200 MG tablet Take 200 mg by mouth every 6 (six) hours as needed for fever, headache or mild pain.     [name] levothyroxine (SYNTHROID, LEVOTHROID) 25 MCG tablet Take 1 tablet (25 mcg total) by mouth daily before breakfast. 90 tablet 1  . lovastatin (MEVACOR) 20 MG tablet Take 1 tablet (20 mg total) by mouth every other day. 90 tablet 1  . meclizine  (ANTIVERT) 25 MG tablet Take 25 mg by mouth 3 (three) times daily as needed for dizziness.    . metFORMIN (GLUCOPHAGE) 500 MG tablet Take 1 tablet (500 mg total) by mouth 2 (two) times daily with a meal. 180 tablet 3  . non-metallic deodorant (ALRA) MISC Apply 1 application topically daily as needed.    Glory Rosebush DELICA LANCETS FINE MISC Use one lancet to check blood sugar twice daily 100 each 3  . prochlorperazine (COMPAZINE) 10 MG tablet Take 1 tablet (10 mg total) by mouth every 6 (six) hours as needed for nausea or vomiting. 30 tablet 0  . ramipril (ALTACE) 2.5 MG capsule Take 1 capsule (2.5 mg total) by mouth daily. 90 capsule 2  . Wound Cleansers (RADIAPLEX EX) Apply topically.     No current facility-administered medications for this visit.     SURGICAL HISTORY:  Past Surgical History:  Procedure Laterality Date  . ABDOMINAL HYSTERECTOMY    . APPENDECTOMY     as a child  . CHEST TUBE REMOVAL  07/13/11   RIGHT  CHEST TUBE  . CHOLECYSTECTOMY    . COLONOSCOPY    . COLONOSCOPY    . DILATION AND CURETTAGE OF UTERUS     x 2  . EYE SURGERY Bilateral    Cataract  . HEMORRHOID SURGERY    . LUNG BIOPSY  07/09/11   RIGHT UPER LOBE LUNG MASS   . RIGHT CHEST TUBE PLACEMENT  07/10/11   S/P LUNG BX  . ROTATOR CUFF REPAIR     bilateral  . TOE SURGERY  2013   right small toe  . TONSILLECTOMY     as a child  . VIDEO BRONCHOSCOPY  07/17/2011   Procedure: VIDEO BRONCHOSCOPY;  Surgeon: Grace Isaac, MD;  Location: [facility];  Service: Thoracic;  Laterality: N/A;  . VIDEO BRONCHOSCOPY WITH ENDOBRONCHIAL ULTRASOUND N/A 12/10/2015   Procedure: VIDEO BRONCHOSCOPY WITH ENDOBRONCHIAL ULTRASOUND with biopsies.;  Surgeon: Grace Isaac, MD;  Location: [facility] OR;  Service: Thoracic;  Laterality: N/A;    REVIEW OF SYSTEMS:  A comprehensive review of systems was negative except for: Gastrointestinal: positive for odynophagia   PHYSICAL EXAMINATION: General appearance: alert, cooperative and no  distress Head: Normocephalic, without obvious abnormality, atraumatic Neck: no adenopathy, no JVD, supple, symmetrical, trachea midline and thyroid not enlarged, symmetric, no tenderness/mass/nodules Lymph nodes: Cervical, supraclavicular, and axillary nodes normal. Resp: clear to auscultation bilaterally Back: symmetric, no curvature. ROM normal. No CVA tenderness. Cardio: regular rate and rhythm, S1, S2 normal, no murmur, click, rub or gallop GI: soft, non-tender; bowel sounds normal; no masses,  no organomegaly Extremities: extremities normal, atraumatic, no cyanosis or edema  ECOG PERFORMANCE STATUS: 1 - Symptomatic but completely ambulatory  Blood pressure (!) 143/55, pulse 81, temperature 98.4 F (36.9 C), temperature source Oral, resp. rate 16, height '5\' 1"'  (1.549 m), weight 130 lb 14.4 oz (59.4 kg), SpO2 97 %.  LABORATORY DATA: Lab Results  Component Value Date   WBC 3.5 (L) 01/20/2016   HGB 12.0 01/20/2016   HCT 36.1 01/20/2016   MCV 84.6 01/20/2016   PLT 217 01/20/2016      Chemistry      Component Value Date/Time   NA 139 01/20/2016 1128   K 3.9 01/20/2016 1128   CL 103 12/10/2015 0758   CO2 23 01/20/2016 1128   BUN 17.6 01/20/2016 1128   CREATININE 0.7 01/20/2016 1128      Component Value Date/Time   CALCIUM 9.0 01/20/2016 1128   ALKPHOS 74 01/20/2016 1128   AST 16 01/20/2016 1128   ALT 21 01/20/2016 1128   BILITOT 0.34 01/20/2016 1128       RADIOGRAPHIC STUDIES: Mr Jeri Cos SE Contrast  Result Date: 12/26/2015 CLINICAL DATA:  82 year old female right lung adenocarcinoma. Staging. Subsequent encounter. EXAM: MRI HEAD WITHOUT AND WITH CONTRAST TECHNIQUE: Multiplanar, multiecho pulse sequences of the brain and surrounding structures were obtained without and with intravenous contrast. CONTRAST:  67m MULTIHANCE GADOBENATE DIMEGLUMINE 529 MG/ML IV SOLN COMPARISON:  PET-CT 12/06/2015.  Brain MRI 07/03/2011. FINDINGS: No abnormal enhancement identified. No  midline shift, mass effect, or evidence of intracranial mass lesion. No dural thickening. Visible bone marrow signal is within normal limits. Negative visualized cervical spine and spinal cord. Major intracranial vascular flow voids are stable with mild generalized intracranial artery dolichoectasia. No restricted diffusion to suggest acute infarction. No ventriculomegaly, extra-axial collection or acute intracranial hemorrhage. Cervicomedullary junction and pituitary are within normal limits. GPearline Cablesand white matter signal remains normal for age. Visible internal auditory structures appear normal. Visualized paranasal sinuses and mastoids are stable and well pneumatized. Interval postoperative changes to both globes. Negative scalp soft tissues. IMPRESSION: Stable since 2013 and normal for age MRI appearance of the brain. No metastatic disease or acute intracranial abnormality. Electronically Signed   By: HGenevie AnnM.D.   On: 12/26/2015 14:51    ASSESSMENT AND PLAN: This is a very pleasant 82years old white female recently diagnosed with unresectable stage IIA non-small cell lung cancer. The patient is currently undergoing a course of concurrent chemoradiation with weekly carboplatin and paclitaxel. Status post 2 cycles. She tolerated the first 2 cycles fairly well with no significant adverse effects except for mild odynophagia. She is feeling fine and has no complaints today. I recommended for the patient to proceed with cycle #3 today as scheduled. The patient would come back for follow-up visit in 2 weeks for evaluation and close monitoring of her treatment. For the odynophagia, she will talk to Dr. MTammi Klippelregarding her radiation treatment and probably starting Carafate. She was advised to call immediately if she has any concerning symptoms in the interval. The patient voices understanding of current disease status and treatment options and is in agreement with the current care plan.  All questions  were answered. The patient knows to call the clinic with any problems, questions or concerns. We can certainly see the patient much sooner if necessary.  Disclaimer: This note was dictated with voice recognition software. Similar sounding words can inadvertently be transcribed and may not be corrected upon review.

## 2016-01-20 NOTE — Progress Notes (Signed)
Oncology Nurse Navigator Documentation  Oncology Nurse Navigator Flowsheets 01/20/2016  Navigator Encounter Type Clinic/MDC/patient is doing well with treatment and is without complaints.   Patient Visit Type MedOnc  Treatment Phase Treatment  Barriers/Navigation Needs No barriers at this time  Interventions None required  Acuity Level 1  Acuity Level 1 Minimal follow up required  Time Spent with Patient 15

## 2016-01-21 ENCOUNTER — Ambulatory Visit
Admission: RE | Admit: 2016-01-21 | Discharge: 2016-01-21 | Disposition: A | Discharge status: Home / Self Care | Payer: Medicare Other | Source: Ambulatory Visit | Attending: Radiation Oncology | Admitting: Radiation Oncology

## 2016-01-21 DIAGNOSIS — Z510 Encounter for antineoplastic radiation therapy: Secondary | ICD-10-CM | POA: Diagnosis not present

## 2016-01-21 DIAGNOSIS — C3491 Malignant neoplasm of unspecified part of right bronchus or lung: Secondary | ICD-10-CM | POA: Diagnosis not present

## 2016-01-22 ENCOUNTER — Ambulatory Visit
Admission: RE | Admit: 2016-01-22 | Discharge: 2016-01-22 | Disposition: A | Discharge status: Home / Self Care | Payer: Medicare Other | Source: Ambulatory Visit | Attending: Radiation Oncology | Admitting: Radiation Oncology

## 2016-01-22 ENCOUNTER — Other Ambulatory Visit: Payer: Self-pay | Admitting: Emergency Medicine

## 2016-01-22 VITALS — BP 127/64 | HR 70 | Temp 98.4°F | Resp 18 | Ht 61.0 in | Wt 130.0 lb

## 2016-01-22 DIAGNOSIS — C3491 Malignant neoplasm of unspecified part of right bronchus or lung: Secondary | ICD-10-CM | POA: Diagnosis not present

## 2016-01-22 DIAGNOSIS — Z510 Encounter for antineoplastic radiation therapy: Secondary | ICD-10-CM | POA: Diagnosis not present

## 2016-01-22 MED ORDER — SUCRALFATE 1 G PO TABS: 1.0000 g | ORAL_TABLET | Freq: Three times a day (TID) | ORAL | 0 refills | Status: DC

## 2016-01-22 NOTE — Progress Notes (Unsigned)
Pt is due for a TSH for PCP, Dr Quay Burow, and would like to have this done while getting blood work done with her Chemo/radiation blood work. Please have this drawn with her next blood draw if you are okay with this.

## 2016-01-22 NOTE — Progress Notes (Addendum)
Weight and vitals stable. Denies pain. Denies pain.  Having difficulty associated with swallowing able to eat discomfort goes to her back.  Reports that her tongue feel funny and different. Denies shortness of breath. Reports a chronic dry cough that is no worse since the start of treatment thinks it maybe allergy related.. Skin changes within the treatment field pink color more than last week. Reports using radiaplex as directed.  Having mild fatigue.  Wt Readings from Last 3 Encounters:  01/22/16 130 lb (59 kg)  01/20/16 130 lb 14.4 oz (59.4 kg)  01/17/16 131 lb 1.6 oz (59.5 kg)  BP 127/64 (BP Location: Right Arm, Patient Position: Sitting, Cuff Size: Normal)   Pulse 70   Temp 98.4 F (36.9 C) (Oral)   Resp 18   Ht '5\' 1"'$  (1.549 m)   Wt 130 lb (59 kg)   SpO2 100%   BMI 24.56 kg/m

## 2016-01-22 NOTE — Progress Notes (Signed)
  Radiation Oncology         [phone]   Name: Samantha Quinn MRN: [id number]   Date: 01/22/2016  DOB: [birth year]   Weekly Radiation Therapy Management    ICD-9-CM ICD-10-CM   1. Primary adenocarcinoma of lung, right (HCC) 162.9 C34.91     Current Dose: 26 Gy  Planned Dose: 66Gy  Narrative The patient presents for routine under treatment assessment. She has been having pain with swallowing during eating. She is still eating despite this. She denies any chest pain or shortness of breath, but when swallowing the pain she has radiates to the posterior chest. She denies fevers or chills.  Set-up films were reviewed. The chart was checked.  Physical Findings  height is '5\' 1"'$  (1.549 m) and weight is 130 lb (59 kg). Her oral temperature is 98.4 F (36.9 C). Her blood pressure is 127/64 and her pulse is 70. Her respiration is 18 and oxygen saturation is 100%.   In general this is a well appearing caucasian female in no acute distress. She's alert and oriented x4 and appropriate throughout the examination. Cardiopulmonary assessment is negative for acute distress and she exhibits normal effort. The tongue is evaluated also and is without plaques.    Impression The patient is tolerating radiation.  Plan Continue treatment as planned. Carafate 1g po q AC and HS         Delaine Canter A. Tammi Klippel, M.D.

## 2016-01-23 ENCOUNTER — Ambulatory Visit
Admission: RE | Admit: 2016-01-23 | Discharge: 2016-01-23 | Disposition: A | Discharge status: Home / Self Care | Payer: Medicare Other | Source: Ambulatory Visit | Attending: Radiation Oncology | Admitting: Radiation Oncology

## 2016-01-23 DIAGNOSIS — C3491 Malignant neoplasm of unspecified part of right bronchus or lung: Secondary | ICD-10-CM | POA: Diagnosis not present

## 2016-01-23 DIAGNOSIS — Z510 Encounter for antineoplastic radiation therapy: Secondary | ICD-10-CM | POA: Diagnosis not present

## 2016-01-24 ENCOUNTER — Ambulatory Visit: Admission: RE | Admit: 2016-01-24 | Payer: Medicare Other | Source: Ambulatory Visit | Admitting: Radiation Oncology

## 2016-01-24 ENCOUNTER — Ambulatory Visit
Admission: RE | Admit: 2016-01-24 | Discharge: 2016-01-24 | Disposition: A | Discharge status: Home / Self Care | Payer: Medicare Other | Source: Ambulatory Visit | Attending: Radiation Oncology | Admitting: Radiation Oncology

## 2016-01-24 DIAGNOSIS — C3491 Malignant neoplasm of unspecified part of right bronchus or lung: Secondary | ICD-10-CM | POA: Diagnosis not present

## 2016-01-24 DIAGNOSIS — Z510 Encounter for antineoplastic radiation therapy: Secondary | ICD-10-CM | POA: Diagnosis not present

## 2016-01-27 ENCOUNTER — Other Ambulatory Visit: Payer: Self-pay | Admitting: Internal Medicine

## 2016-01-27 ENCOUNTER — Telehealth: Payer: Self-pay | Admitting: Emergency Medicine

## 2016-01-27 ENCOUNTER — Ambulatory Visit
Admission: RE | Admit: 2016-01-27 | Discharge: 2016-01-27 | Disposition: A | Discharge status: Home / Self Care | Payer: Medicare Other | Source: Ambulatory Visit | Attending: Radiation Oncology | Admitting: Radiation Oncology

## 2016-01-27 ENCOUNTER — Ambulatory Visit (HOSPITAL_BASED_OUTPATIENT_CLINIC_OR_DEPARTMENT_OTHER): Payer: Medicare Other

## 2016-01-27 ENCOUNTER — Other Ambulatory Visit (HOSPITAL_BASED_OUTPATIENT_CLINIC_OR_DEPARTMENT_OTHER): Payer: Medicare Other

## 2016-01-27 ENCOUNTER — Other Ambulatory Visit: Payer: Self-pay | Admitting: *Deleted

## 2016-01-27 VITALS — BP 142/75 | HR 82 | Temp 97.5°F | Resp 16

## 2016-01-27 DIAGNOSIS — C3491 Malignant neoplasm of unspecified part of right bronchus or lung: Secondary | ICD-10-CM | POA: Diagnosis not present

## 2016-01-27 DIAGNOSIS — E042 Nontoxic multinodular goiter: Secondary | ICD-10-CM

## 2016-01-27 DIAGNOSIS — Z79899 Other long term (current) drug therapy: Secondary | ICD-10-CM

## 2016-01-27 DIAGNOSIS — Z5111 Encounter for antineoplastic chemotherapy: Secondary | ICD-10-CM | POA: Diagnosis not present

## 2016-01-27 DIAGNOSIS — Z510 Encounter for antineoplastic radiation therapy: Secondary | ICD-10-CM | POA: Diagnosis not present

## 2016-01-27 LAB — COMPREHENSIVE METABOLIC PANEL
ALT: 21 U/L (ref 0–55)
ANION GAP: 11 meq/L (ref 3–11)
AST: 19 U/L (ref 5–34)
Albumin: 3.6 g/dL (ref 3.5–5.0)
Alkaline Phosphatase: 81 U/L (ref 40–150)
BUN: 13.4 mg/dL (ref 7.0–26.0)
CHLORIDE: 103 meq/L (ref 98–109)
CO2: 23 meq/L (ref 22–29)
CREATININE: 0.7 mg/dL (ref 0.6–1.1)
Calcium: 10.5 mg/dL — ABNORMAL HIGH (ref 8.4–10.4)
EGFR: 80 mL/min/{1.73_m2} — ABNORMAL LOW (ref >90)
Glucose: 106 mg/dl (ref 70–140)
Potassium: 4 mEq/L (ref 3.5–5.1)
Sodium: 138 mEq/L (ref 136–145)
Total Bilirubin: 0.32 mg/dL (ref 0.20–1.20)
Total Protein: 7.1 g/dL (ref 6.4–8.3)

## 2016-01-27 LAB — CBC WITH DIFFERENTIAL/PLATELET
BASO%: 0.3 % (ref 0.0–2.0)
Basophils Absolute: 0 10*3/uL (ref 0.0–0.1)
EOS ABS: 0 10*3/uL (ref 0.0–0.5)
EOS%: 0.6 % (ref 0.0–7.0)
HCT: 35.5 % (ref 34.8–46.6)
HGB: 12 g/dL (ref 11.6–15.9)
LYMPH%: 15.4 % (ref 14.0–49.7)
MCH: 28.4 pg (ref 25.1–34.0)
MCHC: 33.8 g/dL (ref 31.5–36.0)
MCV: 84.1 fL (ref 79.5–101.0)
MONO#: 0.2 10*3/uL (ref 0.1–0.9)
MONO%: 7.1 % (ref 0.0–14.0)
NEUT#: 2.5 10*3/uL (ref 1.5–6.5)
NEUT%: 76.6 % (ref 38.4–76.8)
Platelets: 222 10*3/uL (ref 145–400)
RBC: 4.22 10*6/uL (ref 3.70–5.45)
RDW: 13.8 % (ref 11.2–14.5)
WBC: 3.2 10*3/uL — AB (ref 3.9–10.3)
lymph#: 0.5 10*3/uL — ABNORMAL LOW (ref 0.9–3.3)

## 2016-01-27 MED ORDER — PALONOSETRON HCL INJECTION 0.25 MG/5ML
INTRAVENOUS | Status: AC
  Filled 2016-01-27: qty 5

## 2016-01-27 MED ORDER — DIPHENHYDRAMINE HCL 50 MG/ML IJ SOLN
50.0000 mg | Freq: Once | INTRAMUSCULAR | Status: AC
Start: 2016-01-27 — End: 2016-01-27
  Administered 2016-01-27: 50 mg via INTRAVENOUS

## 2016-01-27 MED ORDER — SODIUM CHLORIDE 0.9 % IV SOLN
Freq: Once | INTRAVENOUS | Status: AC
  Administered 2016-01-27: 14:00:00 via INTRAVENOUS

## 2016-01-27 MED ORDER — FAMOTIDINE IN NACL 20-0.9 MG/50ML-% IV SOLN
20.0000 mg | Freq: Once | INTRAVENOUS | Status: AC
  Administered 2016-01-27: 20 mg via INTRAVENOUS

## 2016-01-27 MED ORDER — PALONOSETRON HCL INJECTION 0.25 MG/5ML
0.2500 mg | Freq: Once | INTRAVENOUS | Status: AC
  Administered 2016-01-27: 0.25 mg via INTRAVENOUS

## 2016-01-27 MED ORDER — CARBOPLATIN CHEMO INJECTION 450 MG/45ML
126.8000 mg | Freq: Once | INTRAVENOUS | Status: AC
  Administered 2016-01-27: 130 mg via INTRAVENOUS
  Filled 2016-01-27: qty 13

## 2016-01-27 MED ORDER — DEXAMETHASONE SODIUM PHOSPHATE 100 MG/10ML IJ SOLN
20.0000 mg | Freq: Once | INTRAMUSCULAR | Status: AC
  Administered 2016-01-27: 20 mg via INTRAVENOUS
  Filled 2016-01-27: qty 2

## 2016-01-27 MED ORDER — DIPHENHYDRAMINE HCL 50 MG/ML IJ SOLN
INTRAMUSCULAR | Status: AC
  Filled 2016-01-27: qty 1

## 2016-01-27 MED ORDER — SODIUM CHLORIDE 0.9 % IV SOLN
45.0000 mg/m2 | Freq: Once | INTRAVENOUS | Status: AC
  Administered 2016-01-27: 72 mg via INTRAVENOUS
  Filled 2016-01-27: qty 12

## 2016-01-27 MED ORDER — FAMOTIDINE IN NACL 20-0.9 MG/50ML-% IV SOLN
INTRAVENOUS | Status: AC
  Filled 2016-01-27: qty 50

## 2016-01-27 NOTE — Telephone Encounter (Signed)
Patients daughter called and patient is having chemo today. Dr Quay Burow requested she have blood draw for thyroid issues. You let her know she could have the blood drawn there and she would put the order in. They have draw the blood at the cancer center but there is not order in. Can you put that order in? Please call her daughter back when those order get put in. Thanks.

## 2016-01-27 NOTE — Telephone Encounter (Signed)
Spoke with daughter to inform that a message was sent over to Oncology for them to draw the blood work bc we could not order for them. Order is still pending.

## 2016-01-27 NOTE — Patient Instructions (Signed)
[facility] Discharge Instructions for Patients Receiving Chemotherapy  Today you received the following chemotherapy agents Taxol and Carboplatin  To help prevent nausea and vomiting after your treatment, we encourage you to take your nausea medication as directed. No Zofran for 3 days. Take Compazine instead.   If you develop nausea and vomiting that is not controlled by your nausea medication, call the clinic.   BELOW ARE SYMPTOMS THAT SHOULD BE REPORTED IMMEDIATELY:  *FEVER GREATER THAN 100.5 F  *CHILLS WITH OR WITHOUT FEVER  NAUSEA AND VOMITING THAT IS NOT CONTROLLED WITH YOUR NAUSEA MEDICATION  *UNUSUAL SHORTNESS OF BREATH  *UNUSUAL BRUISING OR BLEEDING  TENDERNESS IN MOUTH AND THROAT WITH OR WITHOUT PRESENCE OF ULCERS  *URINARY PROBLEMS  *BOWEL PROBLEMS  UNUSUAL RASH Items with * indicate a potential emergency and should be followed up as soon as possible.  Feel free to call the clinic you have any questions or concerns. The clinic phone number is [phone].  Please show the CHEMO ALERT CARD at check-in to the Emergency Department and triage nurse.   

## 2016-01-28 ENCOUNTER — Ambulatory Visit
Admission: RE | Admit: 2016-01-28 | Discharge: 2016-01-28 | Disposition: A | Discharge status: Home / Self Care | Payer: Medicare Other | Source: Ambulatory Visit | Attending: Radiation Oncology | Admitting: Radiation Oncology

## 2016-01-28 DIAGNOSIS — C3491 Malignant neoplasm of unspecified part of right bronchus or lung: Secondary | ICD-10-CM | POA: Diagnosis not present

## 2016-01-28 DIAGNOSIS — Z510 Encounter for antineoplastic radiation therapy: Secondary | ICD-10-CM | POA: Diagnosis not present

## 2016-01-28 LAB — TSH

## 2016-01-29 ENCOUNTER — Other Ambulatory Visit: Payer: Self-pay | Admitting: Emergency Medicine

## 2016-01-29 ENCOUNTER — Ambulatory Visit
Admission: RE | Admit: 2016-01-29 | Discharge: 2016-01-29 | Disposition: A | Discharge status: Home / Self Care | Payer: Medicare Other | Source: Ambulatory Visit | Attending: Radiation Oncology | Admitting: Radiation Oncology

## 2016-01-29 DIAGNOSIS — C3491 Malignant neoplasm of unspecified part of right bronchus or lung: Secondary | ICD-10-CM | POA: Diagnosis not present

## 2016-01-29 DIAGNOSIS — Z510 Encounter for antineoplastic radiation therapy: Secondary | ICD-10-CM | POA: Diagnosis not present

## 2016-01-29 DIAGNOSIS — H353132 Nonexudative age-related macular degeneration, bilateral, intermediate dry stage: Secondary | ICD-10-CM | POA: Diagnosis not present

## 2016-01-29 DIAGNOSIS — E119 Type 2 diabetes mellitus without complications: Secondary | ICD-10-CM | POA: Diagnosis not present

## 2016-01-29 DIAGNOSIS — H179 Unspecified corneal scar and opacity: Secondary | ICD-10-CM | POA: Diagnosis not present

## 2016-01-30 ENCOUNTER — Ambulatory Visit
Admission: RE | Admit: 2016-01-30 | Discharge: 2016-01-30 | Disposition: A | Discharge status: Home / Self Care | Payer: Medicare Other | Source: Ambulatory Visit | Attending: Radiation Oncology | Admitting: Radiation Oncology

## 2016-01-30 DIAGNOSIS — C3491 Malignant neoplasm of unspecified part of right bronchus or lung: Secondary | ICD-10-CM | POA: Diagnosis not present

## 2016-01-30 DIAGNOSIS — Z510 Encounter for antineoplastic radiation therapy: Secondary | ICD-10-CM | POA: Diagnosis not present

## 2016-01-31 ENCOUNTER — Ambulatory Visit
Admission: RE | Admit: 2016-01-31 | Discharge: 2016-01-31 | Disposition: A | Discharge status: Home / Self Care | Payer: Medicare Other | Source: Ambulatory Visit | Attending: Radiation Oncology | Admitting: Radiation Oncology

## 2016-01-31 ENCOUNTER — Encounter: Payer: Self-pay | Admitting: Radiation Oncology

## 2016-01-31 VITALS — BP 133/76 | HR 91 | Temp 97.8°F | Resp 20 | Wt 128.8 lb

## 2016-01-31 DIAGNOSIS — Z510 Encounter for antineoplastic radiation therapy: Secondary | ICD-10-CM | POA: Diagnosis not present

## 2016-01-31 DIAGNOSIS — C3491 Malignant neoplasm of unspecified part of right bronchus or lung: Secondary | ICD-10-CM

## 2016-01-31 NOTE — Progress Notes (Signed)
  Radiation Oncology         [phone]   Name: Samantha Quinn MRN: [id number]   Date: 01/31/2016  DOB: April 11, 1928   Weekly Radiation Therapy Management    ICD-9-CM ICD-10-CM   1. Primary adenocarcinoma of right lung (HCC) 162.9 C34.91     Current Dose: 40 Gy  Planned Dose: 66 Gy  Narrative The patient presents for routine under treatment assessment.  Weekly rad tx right lung. The patient has a rash and pruritus in the upper back. Using Radiaplex bid and cortisone cream. Okay appetite. Does have some difficulty when swallowing foods. It takes a while for food to get through. Reports not taking carafate as yet. Fair energy level, taking naps, rest periods. Reports nose bleeds.  Set-up films were reviewed. The chart was checked.  Physical Findings  weight is 128 lb 12.8 oz (58.4 kg). Her oral temperature is 97.8 F (36.6 C). Her blood pressure is 133/76 and her pulse is 91. Her respiration is 20 and oxygen saturation is 100%.   In general this is a well appearing caucasian female in no acute distress. She's alert and oriented x4 and appropriate throughout the examination.   Impression The patient is tolerating radiation.  Plan Continue treatment as planned. I advised the patient to use her Carafate when she deems necessary. Nose bleeds may be chemo related.         Sheral Apley Tammi Klippel, M.D.  This document serves as a record of services personally performed by Tyler Pita, MD. It was created on his behalf by Darcus Austin, a trained medical scribe. The creation of this record is based on the scribe's personal observations and the provider's statements to them. This document has been checked and approved by the attending provider.

## 2016-01-31 NOTE — Progress Notes (Signed)
Weekly rad tx right lung,  Has had 37 completed so far, not treated as yet,  Bright rashe dermatitis on back upper right  , using radiaplex  cream bid, has cortisone cream for prn use oin back for itching stated, appetite, okay,  Does have some difficulty when swallowing foods, takes a while for food to get through  Stated, not taking carafate as yet, energy level fair,, taking naps, rest periods,  3:43 PM BP 133/76 (BP Location: Right Arm, Patient Position: Sitting, Cuff Size: Normal)   Pulse 91   Temp 97.8 F (36.6 C) (Oral)   Resp 20   Wt 128 lb 12.8 oz (58.4 kg)   SpO2 100% Comment: room air  BMI 24.34 kg/m   Wt Readings from Last 3 Encounters:  01/31/16 128 lb 12.8 oz (58.4 kg)  01/22/16 130 lb (59 kg)  01/20/16 130 lb 14.4 oz (59.4 kg)

## 2016-02-03 ENCOUNTER — Ambulatory Visit
Admission: RE | Admit: 2016-02-03 | Discharge: 2016-02-03 | Disposition: A | Discharge status: Home / Self Care | Payer: Medicare Other | Source: Ambulatory Visit | Attending: Radiation Oncology | Admitting: Radiation Oncology

## 2016-02-03 ENCOUNTER — Encounter: Payer: Self-pay | Admitting: Internal Medicine

## 2016-02-03 ENCOUNTER — Ambulatory Visit (HOSPITAL_BASED_OUTPATIENT_CLINIC_OR_DEPARTMENT_OTHER): Payer: Medicare Other

## 2016-02-03 ENCOUNTER — Other Ambulatory Visit (HOSPITAL_BASED_OUTPATIENT_CLINIC_OR_DEPARTMENT_OTHER): Payer: Medicare Other

## 2016-02-03 ENCOUNTER — Ambulatory Visit (HOSPITAL_BASED_OUTPATIENT_CLINIC_OR_DEPARTMENT_OTHER): Payer: Medicare Other | Admitting: Internal Medicine

## 2016-02-03 ENCOUNTER — Other Ambulatory Visit: Payer: Medicare Other

## 2016-02-03 ENCOUNTER — Telehealth: Payer: Self-pay | Admitting: Internal Medicine

## 2016-02-03 VITALS — BP 138/64 | HR 87 | Temp 98.3°F | Resp 17 | Ht 61.0 in | Wt 125.7 lb

## 2016-02-03 DIAGNOSIS — Z85118 Personal history of other malignant neoplasm of bronchus and lung: Secondary | ICD-10-CM | POA: Diagnosis not present

## 2016-02-03 DIAGNOSIS — Z5111 Encounter for antineoplastic chemotherapy: Secondary | ICD-10-CM

## 2016-02-03 DIAGNOSIS — C3431 Malignant neoplasm of lower lobe, right bronchus or lung: Secondary | ICD-10-CM

## 2016-02-03 DIAGNOSIS — C3491 Malignant neoplasm of unspecified part of right bronchus or lung: Secondary | ICD-10-CM

## 2016-02-03 DIAGNOSIS — Z510 Encounter for antineoplastic radiation therapy: Secondary | ICD-10-CM | POA: Diagnosis not present

## 2016-02-03 DIAGNOSIS — R530 Neoplastic (malignant) related fatigue: Secondary | ICD-10-CM | POA: Diagnosis not present

## 2016-02-03 DIAGNOSIS — R1310 Dysphagia, unspecified: Secondary | ICD-10-CM | POA: Diagnosis not present

## 2016-02-03 LAB — COMPREHENSIVE METABOLIC PANEL
ALBUMIN: 3.4 g/dL — AB (ref 3.5–5.0)
ALT: 16 U/L (ref 0–55)
AST: 15 U/L (ref 5–34)
Alkaline Phosphatase: 72 U/L (ref 40–150)
Anion Gap: 10 mEq/L (ref 3–11)
BILIRUBIN TOTAL: 0.31 mg/dL (ref 0.20–1.20)
BUN: 14.4 mg/dL (ref 7.0–26.0)
CO2: 24 meq/L (ref 22–29)
CREATININE: 0.7 mg/dL (ref 0.6–1.1)
Calcium: 9.5 mg/dL (ref 8.4–10.4)
Chloride: 104 mEq/L (ref 98–109)
EGFR: 79 mL/min/{1.73_m2} — ABNORMAL LOW (ref >90)
GLUCOSE: 121 mg/dL (ref 70–140)
Potassium: 3.7 mEq/L (ref 3.5–5.1)
Sodium: 138 mEq/L (ref 136–145)
TOTAL PROTEIN: 6.8 g/dL (ref 6.4–8.3)

## 2016-02-03 LAB — CBC WITH DIFFERENTIAL/PLATELET
BASO%: 0.3 % (ref 0.0–2.0)
BASOS ABS: 0 10*3/uL (ref 0.0–0.1)
EOS ABS: 0 10*3/uL (ref 0.0–0.5)
EOS%: 0.6 % (ref 0.0–7.0)
HCT: 34.6 % — ABNORMAL LOW (ref 34.8–46.6)
HGB: 11.7 g/dL (ref 11.6–15.9)
LYMPH%: 11.2 % — AB (ref 14.0–49.7)
MCH: 28.4 pg (ref 25.1–34.0)
MCHC: 33.8 g/dL (ref 31.5–36.0)
MCV: 84 fL (ref 79.5–101.0)
MONO#: 0.3 10*3/uL (ref 0.1–0.9)
MONO%: 7.5 % (ref 0.0–14.0)
NEUT%: 80.4 % — AB (ref 38.4–76.8)
NEUTROS ABS: 2.8 10*3/uL (ref 1.5–6.5)
PLATELETS: 155 10*3/uL (ref 145–400)
RBC: 4.12 10*6/uL (ref 3.70–5.45)
RDW: 13.8 % (ref 11.2–14.5)
WBC: 3.5 10*3/uL — AB (ref 3.9–10.3)
lymph#: 0.4 10*3/uL — ABNORMAL LOW (ref 0.9–3.3)

## 2016-02-03 MED ORDER — DIPHENHYDRAMINE HCL 50 MG/ML IJ SOLN
50.0000 mg | Freq: Once | INTRAMUSCULAR | Status: AC
  Administered 2016-02-03: 50 mg via INTRAVENOUS

## 2016-02-03 MED ORDER — SODIUM CHLORIDE 0.9 % IV SOLN
Freq: Once | INTRAVENOUS | Status: AC
  Administered 2016-02-03: 15:00:00 via INTRAVENOUS

## 2016-02-03 MED ORDER — SODIUM CHLORIDE 0.9 % IV SOLN
126.8000 mg | Freq: Once | INTRAVENOUS | Status: AC
  Administered 2016-02-03: 130 mg via INTRAVENOUS
  Filled 2016-02-03: qty 13

## 2016-02-03 MED ORDER — DIPHENHYDRAMINE HCL 50 MG/ML IJ SOLN
INTRAMUSCULAR | Status: AC
  Filled 2016-02-03: qty 1

## 2016-02-03 MED ORDER — SODIUM CHLORIDE 0.9 % IV SOLN
20.0000 mg | Freq: Once | INTRAVENOUS | Status: AC
  Administered 2016-02-03: 20 mg via INTRAVENOUS
  Filled 2016-02-03: qty 2

## 2016-02-03 MED ORDER — PALONOSETRON HCL INJECTION 0.25 MG/5ML
INTRAVENOUS | Status: AC
  Filled 2016-02-03: qty 5

## 2016-02-03 MED ORDER — FAMOTIDINE IN NACL 20-0.9 MG/50ML-% IV SOLN
INTRAVENOUS | Status: AC
  Filled 2016-02-03: qty 50

## 2016-02-03 MED ORDER — PALONOSETRON HCL INJECTION 0.25 MG/5ML
0.2500 mg | Freq: Once | INTRAVENOUS | Status: AC
  Administered 2016-02-03: 0.25 mg via INTRAVENOUS

## 2016-02-03 MED ORDER — FAMOTIDINE IN NACL 20-0.9 MG/50ML-% IV SOLN
20.0000 mg | Freq: Once | INTRAVENOUS | Status: AC
  Administered 2016-02-03: 20 mg via INTRAVENOUS

## 2016-02-03 MED ORDER — SODIUM CHLORIDE 0.9 % IV SOLN
45.0000 mg/m2 | Freq: Once | INTRAVENOUS | Status: AC
  Administered 2016-02-03: 72 mg via INTRAVENOUS
  Filled 2016-02-03: qty 12

## 2016-02-03 NOTE — Telephone Encounter (Signed)
Patient already on schedule for weekly lab/chemo and f/u in two weeks. No orders in care plan beyond two week follow up on 10/23.

## 2016-02-03 NOTE — Progress Notes (Signed)
[facility] Telephone:([phone]) [phone]   Fax:([phone]) [phone]  OFFICE PROGRESS NOTE  Binnie Rail, MD [place] [address]  DIAGNOSIS:  recurrent non-small cell lung cancer, stage IIA (T1a, N1, M0) non-small cell lung cancer, adenocarcinoma presented with perihilar right lower lobe soft tissue mass with right hilar adenopathy diagnosed in August 2017. The patient has a history of stage IB (T2a, N0, M0) non-small cell lung cancer, adenocarcinoma diagnosed in March 2013.  PRIOR THERAPY: Status post right upper lobectomy with lymph node dissection. The tumor was negative for EGFR and ALK mutations.  CURRENT THERAPY: A course of concurrent chemoradiation with weekly carboplatin for AUC of 2 and paclitaxel 45 MG/M2. Status post 4 cycles.  INTERVAL HISTORY: Samantha Quinn 82 y.o. female returns to the clinic today for follow-up visit accompanied by her daughter. The patient is feeling fine today with no specific complaints except for mild odynophagia secondary to radiation induced esophagitis. She was started on treatment with Carafate by Dr. Tammi Klippel. She is tolerating her concurrent chemoradiation fairly well with no significant complaints except the fatigue and odynophagia. She status post 4 cycles. She denied having any significant chest pain, shortness of breath, cough or hemoptysis. She has no nausea or vomiting. She denied having any significant weight loss or night sweats. She had mild odynophagia. She is here today to start cycle #5.  MEDICAL HISTORY: Past Medical History:  Diagnosis Date  . Back pain    bulding disc  . Bronchitis    hx of-many,many years   . Diabetes mellitus   . Diabetes mellitus    type 2 and takes Metformin bid  . Dyslipidemia   . Embolism - blood clot    hx of in left lower leg and was on Coumadin for about 21month;this was about 8-19yrago  . Family history of adverse reaction to anesthesia    Daughter- Nausea  . History of  colonic polyps   . HOH (hard of hearing)   . Hx of migraines    as a teenager  . Hypercholesterolemia   . Hyperlipidemia    takes Lovastatin every other day  . Hypertension    takes Ziac daily and Ramipril as well  . Hypothyroidism    takes Synthroid every other day  . Insomnia    takes Elavil nightly  . Joint pain   . Lung mass   . Myalgia   . Osteoarthritis   . Osteoporosis   . Pneumothorax of right lung after biopsy 07/09/11   HAD CHEST TUBE PLACEMENT  . PONV (postoperative nausea and vomiting)   . Primary adenocarcinoma of lung ([facility]2/27/2013  . Shortness of breath dyspnea    With exertion  . Skin cancer    legs have dark spots on them  . Upper respiratory infection 04/2011  . Urinary incontinence    takes Detrol daily prn    ALLERGIES:  is allergic to codeine; tramadol; and demerol.  MEDICATIONS:  Current Outpatient Prescriptions  Medication Sig Dispense Refill  . amitriptyline (ELAVIL) 10 MG tablet Take 1 tablet (10 mg total) by mouth at bedtime as needed. (Patient taking differently: Take 5 mg by mouth at bedtime. ) 90 tablet 1  . bisoprolol-hydrochlorothiazide (ZIAC) 2.5-6.25 MG tablet Take 1 tablet by mouth daily. 90 tablet 3  . Blood Glucose Monitoring Suppl (ONE TOUCH ULTRA 2) w/Device KIT Use to check blood sugars twice a day Dx E11.9 1 each 0  . calcium  carbonate (OS-CAL) 600 MG tablet Take 600 mg by mouth daily.    . Calcium Carbonate-Vit D-Min (CALCIUM-VITAMIN D-MINERALS) 600-800 MG-UNIT CHEW Chew by mouth daily.    [name] gabapentin (NEURONTIN) 100 MG capsule Take 200 mg by mouth 3 (three) times daily.     [name] glucose blood (ONE TOUCH ULTRA TEST) test strip 1 each by Other route 2 (two) times daily. Use to check blood sugars twice a day Dx E11.9 100 each 3  . ibuprofen (ADVIL,MOTRIN) 200 MG tablet Take 200 mg by mouth every 6 (six) hours as needed for fever, headache or mild pain.     [name] levothyroxine (SYNTHROID, LEVOTHROID) 25 MCG tablet Take 1 tablet (25 mcg  total) by mouth daily before breakfast. 90 tablet 1  . lovastatin (MEVACOR) 20 MG tablet Take 1 tablet (20 mg total) by mouth every other day. 90 tablet 1  . meclizine (ANTIVERT) 25 MG tablet Take 25 mg by mouth 3 (three) times daily as needed for dizziness.    . metFORMIN (GLUCOPHAGE) 500 MG tablet Take 1 tablet (500 mg total) by mouth 2 (two) times daily with a meal. 180 tablet 3  . non-metallic deodorant (ALRA) MISC Apply 1 application topically daily as needed.    Glory Rosebush DELICA LANCETS FINE MISC Use one lancet to check blood sugar twice daily 100 each 3  . prochlorperazine (COMPAZINE) 10 MG tablet Take 1 tablet (10 mg total) by mouth every 6 (six) hours as needed for nausea or vomiting. 30 tablet 0  . ramipril (ALTACE) 2.5 MG capsule Take 1 capsule (2.5 mg total) by mouth daily. 90 capsule 2  . sucralfate (CARAFATE) 1 g tablet Take 1 tablet (1 g total) by mouth 4 (four) times daily -  with meals and at bedtime. 120 tablet 0  . Wound Cleansers (RADIAPLEX EX) Apply topically.     No current facility-administered medications for this visit.     SURGICAL HISTORY:  Past Surgical History:  Procedure Laterality Date  . ABDOMINAL HYSTERECTOMY    . APPENDECTOMY     as a child  . CHEST TUBE REMOVAL  07/13/11   RIGHT  CHEST TUBE  . CHOLECYSTECTOMY    . COLONOSCOPY    . COLONOSCOPY    . DILATION AND CURETTAGE OF UTERUS     x 2  . EYE SURGERY Bilateral    Cataract  . HEMORRHOID SURGERY    . LUNG BIOPSY  07/09/11   RIGHT UPER LOBE LUNG MASS   . RIGHT CHEST TUBE PLACEMENT  07/10/11   S/P LUNG BX  . ROTATOR CUFF REPAIR     bilateral  . TOE SURGERY  2013   right small toe  . TONSILLECTOMY     as a child  . VIDEO BRONCHOSCOPY  07/17/2011   Procedure: VIDEO BRONCHOSCOPY;  Surgeon: Grace Isaac, MD;  Location: [facility];  Service: Thoracic;  Laterality: N/A;  . VIDEO BRONCHOSCOPY WITH ENDOBRONCHIAL ULTRASOUND N/A 12/10/2015   Procedure: VIDEO BRONCHOSCOPY WITH ENDOBRONCHIAL ULTRASOUND with  biopsies.;  Surgeon: Grace Isaac, MD;  Location: ;  Service: Thoracic;  Laterality: N/A;    REVIEW OF SYSTEMS:  Constitutional: positive for fatigue and weight loss Eyes: negative Ears, nose, mouth, throat, and face: negative Respiratory: positive for cough Cardiovascular: negative Gastrointestinal: positive for odynophagia Genitourinary:negative Integument/breast: negative Hematologic/lymphatic: negative Musculoskeletal:negative Neurological: negative Behavioral/Psych: negative Endocrine: negative Allergic/Immunologic: negative   PHYSICAL EXAMINATION: General appearance: alert, cooperative and no distress Head: Normocephalic, without obvious abnormality, atraumatic Neck: no  adenopathy, no JVD, supple, symmetrical, trachea midline and thyroid not enlarged, symmetric, no tenderness/mass/nodules Lymph nodes: Cervical, supraclavicular, and axillary nodes normal. Resp: clear to auscultation bilaterally Back: symmetric, no curvature. ROM normal. No CVA tenderness. Cardio: regular rate and rhythm, S1, S2 normal, no murmur, click, rub or gallop GI: soft, non-tender; bowel sounds normal; no masses,  no organomegaly Extremities: extremities normal, atraumatic, no cyanosis or edema Neurologic: Alert and oriented X 3, normal strength and tone. Normal symmetric reflexes. Normal coordination and gait  ECOG PERFORMANCE STATUS: 1 - Symptomatic but completely ambulatory  Blood pressure 138/64, pulse 87, temperature 98.3 F (36.8 C), temperature source Oral, resp. rate 17, height _0  (1.549 m), weight 125 lb 11.2 oz (57 kg), SpO2 100 %.  LABORATORY DATA: Lab Results  Component Value Date   WBC 3.5 (L) 02/03/2016   HGB 11.7 02/03/2016   HCT 34.6 (L) 02/03/2016   MCV 84.0 02/03/2016   PLT 155 02/03/2016      Chemistry      Component Value Date/Time   NA 138 01/27/2016 1208   K 4.0 01/27/2016 1208   CL 103 12/10/2015 0758   CO2 23 01/27/2016 1208   BUN 13.4 01/27/2016 1208    CREATININE 0.7 01/27/2016 1208      Component Value Date/Time   CALCIUM 10.5 (H) 01/27/2016 1208   ALKPHOS 81 01/27/2016 1208   AST 19 01/27/2016 1208   ALT 21 01/27/2016 1208   BILITOT 0.32 01/27/2016 1208       RADIOGRAPHIC STUDIES: No results found.  ASSESSMENT AND PLAN: This is a very pleasant 82 years old white female recently diagnosed with unresectable stage IIA non-small cell lung cancer. The patient is currently undergoing a course of concurrent chemoradiation with weekly carboplatin and paclitaxel. Status post 4 cycles. She tolerated the first 4 cycles fairly well with no significant adverse effects except for mild odynophagiaAnd fatigue. She is feeling fine and has no complaints today. I recommended for the patient to proceed with cycle #3 today as scheduled. The patient would come back for follow-up visit in 2 weeks for evaluation and close monitoring of her treatment. For the odynophagia, will continue on Carafate. The patient has several questions today about her condition as well as prognosis and future steps in her treatment and I answered them completely to her satisfactions. She was advised to call immediately if she has any concerning symptoms in the interval. The patient voices understanding of current disease status and treatment options and is in agreement with the current care plan.  All questions were answered. The patient knows to call the clinic with any problems, questions or concerns. We can certainly see the patient much sooner if necessary.  Disclaimer: This note was dictated with voice recognition software. Similar sounding words can inadvertently be transcribed and may not be corrected upon review.

## 2016-02-04 ENCOUNTER — Ambulatory Visit
Admission: RE | Admit: 2016-02-04 | Discharge: 2016-02-04 | Disposition: A | Discharge status: Home / Self Care | Payer: Medicare Other | Source: Ambulatory Visit | Attending: Radiation Oncology | Admitting: Radiation Oncology

## 2016-02-04 DIAGNOSIS — C3491 Malignant neoplasm of unspecified part of right bronchus or lung: Secondary | ICD-10-CM | POA: Diagnosis not present

## 2016-02-04 DIAGNOSIS — Z510 Encounter for antineoplastic radiation therapy: Secondary | ICD-10-CM | POA: Diagnosis not present

## 2016-02-05 ENCOUNTER — Ambulatory Visit
Admission: RE | Admit: 2016-02-05 | Discharge: 2016-02-05 | Disposition: A | Discharge status: Home / Self Care | Payer: Medicare Other | Source: Ambulatory Visit | Attending: Radiation Oncology | Admitting: Radiation Oncology

## 2016-02-05 DIAGNOSIS — Z510 Encounter for antineoplastic radiation therapy: Secondary | ICD-10-CM | POA: Diagnosis not present

## 2016-02-05 DIAGNOSIS — C3491 Malignant neoplasm of unspecified part of right bronchus or lung: Secondary | ICD-10-CM | POA: Diagnosis not present

## 2016-02-06 ENCOUNTER — Ambulatory Visit
Admission: RE | Admit: 2016-02-06 | Discharge: 2016-02-06 | Disposition: A | Discharge status: Home / Self Care | Payer: Medicare Other | Source: Ambulatory Visit | Attending: Radiation Oncology | Admitting: Radiation Oncology

## 2016-02-06 ENCOUNTER — Encounter: Payer: Self-pay | Admitting: Radiation Oncology

## 2016-02-06 VITALS — BP 123/77 | HR 89 | Temp 98.0°F | Resp 16 | Ht 61.0 in | Wt 127.6 lb

## 2016-02-06 DIAGNOSIS — C3491 Malignant neoplasm of unspecified part of right bronchus or lung: Secondary | ICD-10-CM | POA: Diagnosis not present

## 2016-02-06 DIAGNOSIS — C3431 Malignant neoplasm of lower lobe, right bronchus or lung: Secondary | ICD-10-CM

## 2016-02-06 DIAGNOSIS — Z510 Encounter for antineoplastic radiation therapy: Secondary | ICD-10-CM | POA: Diagnosis not present

## 2016-02-06 NOTE — Progress Notes (Signed)
  Radiation Oncology         [phone]   Name: Samantha Quinn MRN: [id number]   Date: 02/06/2016  DOB: 05/05/1927   Weekly Radiation Therapy Management    ICD-9-CM ICD-10-CM   1. Primary adenocarcinoma of lower lobe of right lung (HCC) 162.5 C34.31     Current Dose: 48 Gy  Planned Dose: 66 Gy  Narrative The patient presents for routine under treatment assessment.  Weekly rad tx right lung. Has had 24 completed. Bright rash dermatitis on back upper right chest, using radiaplex gel bid, has cortisone cream for prn use on back for itching stated. Appetite pretty good, stated food does not taste as good. Does have some difficulty when swallowing foods, takes a while for food to get through, taking carafate. Energy level has decreased over the past week, taking naps, rest periods during the day. Describes dysphagia as a burning sensation but is not able to describe the pain associated. Bloody and yellow mucous when blowing nose.  Set-up films were reviewed. The chart was checked.  Physical Findings  height is '5\' 1"'$  (1.549 m) and weight is 127 lb 9.6 oz (57.9 kg). Her oral temperature is 98 F (36.7 C). Her blood pressure is 123/77 and her pulse is 89. Her respiration is 16 and oxygen saturation is 100%.   In general this is a well appearing caucasian female in no acute distress. She's alert and oriented x4 and appropriate throughout the examination.   Impression The patient is tolerating radiation. Discussed Diflucan, but will hold off unless esophagus worsens. I advise using Allegra or other OTC medication for nose bleed/irritation.  Plan Continue treatment as planned.         Sheral Apley Tammi Klippel, M.D.  This document serves as a record of services personally performed by Tyler Pita, MD. It was created on his behalf by Maryla Morrow, a trained medical scribe. The creation of this record is based on the scribe's personal observations and the provider's statements to them. This  document has been checked and approved by the attending provider.

## 2016-02-06 NOTE — Progress Notes (Signed)
Weekly rad tx right lung,  Has had 24 completed.  Bright rash dermatitis on back upper right  chest , using radiaplex  gel bid, has cortisone cream for prn use oin back for itching stated, appetite, pretty good food does not taste as good,  Does have some difficulty when swallowing foods, takes a while for food to get through, taking carafate.   Energy  level has decreased over the past week, taking naps, rest periods during the day. Wt Readings from Last 3 Encounters:  02/06/16 127 lb 9.6 oz (57.9 kg)  02/03/16 125 lb 11.2 oz (57 kg)  01/31/16 128 lb 12.8 oz (58.4 kg)  BP 123/77 (BP Location: Right Arm, Patient Position: Sitting, Cuff Size: Normal)   Pulse 89   Temp 98 F (36.7 C) (Oral)   Resp 16   Ht '5\' 1"'$  (1.549 m)   Wt 127 lb 9.6 oz (57.9 kg)   SpO2 100%   BMI 24.11 kg/m

## 2016-02-07 ENCOUNTER — Ambulatory Visit
Admission: RE | Admit: 2016-02-07 | Discharge: 2016-02-07 | Disposition: A | Discharge status: Home / Self Care | Payer: Medicare Other | Source: Ambulatory Visit | Attending: Radiation Oncology | Admitting: Radiation Oncology

## 2016-02-07 ENCOUNTER — Encounter: Payer: Self-pay | Admitting: *Deleted

## 2016-02-07 ENCOUNTER — Other Ambulatory Visit: Payer: Self-pay | Admitting: *Deleted

## 2016-02-07 DIAGNOSIS — Z510 Encounter for antineoplastic radiation therapy: Secondary | ICD-10-CM | POA: Diagnosis not present

## 2016-02-07 DIAGNOSIS — C3491 Malignant neoplasm of unspecified part of right bronchus or lung: Secondary | ICD-10-CM | POA: Diagnosis not present

## 2016-02-10 ENCOUNTER — Ambulatory Visit
Admission: RE | Admit: 2016-02-10 | Discharge: 2016-02-10 | Disposition: A | Discharge status: Home / Self Care | Payer: Medicare Other | Source: Ambulatory Visit | Attending: Radiation Oncology | Admitting: Radiation Oncology

## 2016-02-10 ENCOUNTER — Ambulatory Visit (HOSPITAL_BASED_OUTPATIENT_CLINIC_OR_DEPARTMENT_OTHER): Payer: Medicare Other

## 2016-02-10 ENCOUNTER — Other Ambulatory Visit (HOSPITAL_BASED_OUTPATIENT_CLINIC_OR_DEPARTMENT_OTHER): Payer: Medicare Other

## 2016-02-10 DIAGNOSIS — Z5111 Encounter for antineoplastic chemotherapy: Secondary | ICD-10-CM

## 2016-02-10 DIAGNOSIS — C3431 Malignant neoplasm of lower lobe, right bronchus or lung: Secondary | ICD-10-CM | POA: Diagnosis not present

## 2016-02-10 DIAGNOSIS — Z510 Encounter for antineoplastic radiation therapy: Secondary | ICD-10-CM | POA: Diagnosis not present

## 2016-02-10 DIAGNOSIS — C3491 Malignant neoplasm of unspecified part of right bronchus or lung: Secondary | ICD-10-CM | POA: Diagnosis not present

## 2016-02-10 LAB — CBC WITH DIFFERENTIAL/PLATELET
BASO%: 0.5 % (ref 0.0–2.0)
Basophils Absolute: 0 10*3/uL (ref 0.0–0.1)
EOS ABS: 0 10*3/uL (ref 0.0–0.5)
EOS%: 0 % (ref 0.0–7.0)
HEMATOCRIT: 33.9 % — AB (ref 34.8–46.6)
HGB: 11.7 g/dL (ref 11.6–15.9)
LYMPH#: 0.3 10*3/uL — AB (ref 0.9–3.3)
LYMPH%: 13.1 % — AB (ref 14.0–49.7)
MCH: 28.6 pg (ref 25.1–34.0)
MCHC: 34.5 g/dL (ref 31.5–36.0)
MCV: 82.9 fL (ref 79.5–101.0)
MONO#: 0.1 10*3/uL (ref 0.1–0.9)
MONO%: 5.4 % (ref 0.0–14.0)
NEUT#: 1.8 10*3/uL (ref 1.5–6.5)
NEUT%: 81 % — AB (ref 38.4–76.8)
PLATELETS: 119 10*3/uL — AB (ref 145–400)
RBC: 4.09 10*6/uL (ref 3.70–5.45)
RDW: 14.2 % (ref 11.2–14.5)
WBC: 2.2 10*3/uL — ABNORMAL LOW (ref 3.9–10.3)

## 2016-02-10 LAB — COMPREHENSIVE METABOLIC PANEL
ALT: 16 U/L (ref 0–55)
ANION GAP: 11 meq/L (ref 3–11)
AST: 17 U/L (ref 5–34)
Albumin: 3.3 g/dL — ABNORMAL LOW (ref 3.5–5.0)
Alkaline Phosphatase: 73 U/L (ref 40–150)
BILIRUBIN TOTAL: 0.37 mg/dL (ref 0.20–1.20)
BUN: 12.5 mg/dL (ref 7.0–26.0)
CALCIUM: 9.5 mg/dL (ref 8.4–10.4)
CHLORIDE: 102 meq/L (ref 98–109)
CO2: 24 meq/L (ref 22–29)
CREATININE: 0.7 mg/dL (ref 0.6–1.1)
EGFR: 80 mL/min/{1.73_m2} — AB (ref >90)
Glucose: 121 mg/dl (ref 70–140)
Potassium: 3.5 mEq/L (ref 3.5–5.1)
Sodium: 137 mEq/L (ref 136–145)
TOTAL PROTEIN: 6.8 g/dL (ref 6.4–8.3)

## 2016-02-10 MED ORDER — PALONOSETRON HCL INJECTION 0.25 MG/5ML
0.2500 mg | Freq: Once | INTRAVENOUS | Status: AC
  Administered 2016-02-10: 0.25 mg via INTRAVENOUS

## 2016-02-10 MED ORDER — SODIUM CHLORIDE 0.9 % IV SOLN
126.8000 mg | Freq: Once | INTRAVENOUS | Status: AC
  Administered 2016-02-10: 130 mg via INTRAVENOUS
  Filled 2016-02-10: qty 13

## 2016-02-10 MED ORDER — FAMOTIDINE IN NACL 20-0.9 MG/50ML-% IV SOLN
INTRAVENOUS | Status: AC
  Filled 2016-02-10: qty 50

## 2016-02-10 MED ORDER — SODIUM CHLORIDE 0.9 % IV SOLN
20.0000 mg | Freq: Once | INTRAVENOUS | Status: AC
  Administered 2016-02-10: 20 mg via INTRAVENOUS
  Filled 2016-02-10: qty 2

## 2016-02-10 MED ORDER — DIPHENHYDRAMINE HCL 50 MG/ML IJ SOLN
INTRAMUSCULAR | Status: AC
  Filled 2016-02-10: qty 1

## 2016-02-10 MED ORDER — FAMOTIDINE IN NACL 20-0.9 MG/50ML-% IV SOLN
20.0000 mg | Freq: Once | INTRAVENOUS | Status: AC
  Administered 2016-02-10: 20 mg via INTRAVENOUS

## 2016-02-10 MED ORDER — PALONOSETRON HCL INJECTION 0.25 MG/5ML
INTRAVENOUS | Status: AC
  Filled 2016-02-10: qty 5

## 2016-02-10 MED ORDER — SODIUM CHLORIDE 0.9 % IV SOLN
Freq: Once | INTRAVENOUS | Status: AC
  Administered 2016-02-10: 15:00:00 via INTRAVENOUS

## 2016-02-10 MED ORDER — DIPHENHYDRAMINE HCL 50 MG/ML IJ SOLN
50.0000 mg | Freq: Once | INTRAMUSCULAR | Status: AC
  Administered 2016-02-10: 50 mg via INTRAVENOUS

## 2016-02-10 MED ORDER — SODIUM CHLORIDE 0.9 % IV SOLN
45.0000 mg/m2 | Freq: Once | INTRAVENOUS | Status: AC
  Administered 2016-02-10: 72 mg via INTRAVENOUS
  Filled 2016-02-10: qty 12

## 2016-02-10 NOTE — Patient Instructions (Signed)
[facility] Discharge Instructions for Patients Receiving Chemotherapy  Today you received the following chemotherapy agents Taxol/Carboplatin  To help prevent nausea and vomiting after your treatment, we encourage you to take your nausea medication    If you develop nausea and vomiting that is not controlled by your nausea medication, call the clinic.   BELOW ARE SYMPTOMS THAT SHOULD BE REPORTED IMMEDIATELY:  *FEVER GREATER THAN 100.5 F  *CHILLS WITH OR WITHOUT FEVER  NAUSEA AND VOMITING THAT IS NOT CONTROLLED WITH YOUR NAUSEA MEDICATION  *UNUSUAL SHORTNESS OF BREATH  *UNUSUAL BRUISING OR BLEEDING  TENDERNESS IN MOUTH AND THROAT WITH OR WITHOUT PRESENCE OF ULCERS  *URINARY PROBLEMS  *BOWEL PROBLEMS  UNUSUAL RASH Items with * indicate a potential emergency and should be followed up as soon as possible.  Feel free to call the clinic you have any questions or concerns. The clinic phone number is [phone].  Please show the CHEMO ALERT CARD at check-in to the Emergency Department and triage nurse.   

## 2016-02-11 ENCOUNTER — Ambulatory Visit
Admission: RE | Admit: 2016-02-11 | Discharge: 2016-02-11 | Disposition: A | Discharge status: Home / Self Care | Payer: Medicare Other | Source: Ambulatory Visit | Attending: Radiation Oncology | Admitting: Radiation Oncology

## 2016-02-11 ENCOUNTER — Telehealth: Payer: Self-pay | Admitting: Radiation Oncology

## 2016-02-11 DIAGNOSIS — C3491 Malignant neoplasm of unspecified part of right bronchus or lung: Secondary | ICD-10-CM | POA: Diagnosis not present

## 2016-02-11 DIAGNOSIS — Z510 Encounter for antineoplastic radiation therapy: Secondary | ICD-10-CM | POA: Diagnosis not present

## 2016-02-11 NOTE — Telephone Encounter (Signed)
Phoned patient to inquire about status. Patient reports pain with eating despite taking Carafate 4 times per day as directed. Hoarseness noted. Patient confirms she is consuming a soft diet. Advised patient to refrain from acidic foods/liquids and bread. Also, advised patient to cut any meats into small portions. Patient verbalized understanding. Patient questions if there is an additional medication Dr. Tammi Klippel can prescribe her to use with Carafate to relieve esophagitis. Patient reports a five pound weight loss. She states, "Dr. Julien Nordmann mentioned that maybe Dr. Tammi Klippel could move the beam." Reports she continues to have blood tinged yellow mucous when she blows her nose. Patient received her 27th radiation treatment today of 45 intended treatments. Discussed cutting treatments short at 30 but, patient wants to complete all 33. She states, "I will be up there for chemotherapy anyway and I want to do everything I can to stay alive." Patient understands this RN will call her back with further directions after informing Dr. Tammi Klippel of these findings.

## 2016-02-11 NOTE — Progress Notes (Signed)
Patient stopped by to talk with Joaquim Lai.,RN, stating it is painful swallowing food or liquids, asked if MD could move the placement of her treatments?, she is eating softer foods, had a boild egg and a piece f bagel this morning, had to chew a long time for the bagel to go down, drinks carnation instant breakfast in a smoothie, cream soups,  Tried chicken tenders last night for dinner, info9rmed that Dr. Tammi Klippel is off today and his Rn is out of the office at present,will be back later and she asked that Aldona Bar to give her a call 10:20 AM BP 117/78 (BP Location: Left Arm, Patient Position: Sitting, Cuff Size: Normal)   Pulse 98   Temp 98 F (36.7 C) (Oral)

## 2016-02-12 ENCOUNTER — Other Ambulatory Visit: Payer: Self-pay | Admitting: *Deleted

## 2016-02-12 ENCOUNTER — Telehealth: Payer: Self-pay | Admitting: Radiation Oncology

## 2016-02-12 ENCOUNTER — Ambulatory Visit
Admission: RE | Admit: 2016-02-12 | Discharge: 2016-02-12 | Disposition: A | Discharge status: Home / Self Care | Payer: Medicare Other | Source: Ambulatory Visit | Attending: Radiation Oncology | Admitting: Radiation Oncology

## 2016-02-12 ENCOUNTER — Telehealth: Payer: Self-pay | Admitting: *Deleted

## 2016-02-12 DIAGNOSIS — Z510 Encounter for antineoplastic radiation therapy: Secondary | ICD-10-CM | POA: Diagnosis not present

## 2016-02-12 DIAGNOSIS — C3431 Malignant neoplasm of lower lobe, right bronchus or lung: Secondary | ICD-10-CM

## 2016-02-12 DIAGNOSIS — C3491 Malignant neoplasm of unspecified part of right bronchus or lung: Secondary | ICD-10-CM | POA: Diagnosis not present

## 2016-02-12 NOTE — Telephone Encounter (Signed)
Pt daughter called requesting IVF on Friday to help pt feel better and get through the weekend. Message to scheduling for IVF to be scheduled. [facility] and informed her to expect a call from scheduling.  Beth asked if MD will be ordering labs on pt for DNA testing.  Will review with MD. No further concerns.

## 2016-02-12 NOTE — Telephone Encounter (Signed)
Phoned pharmacy back to cancel Tramadol script because patient is allergic. Phoned patient to inquire about status. Patient reports she is eating better today. Explains she ate an entire bowl of egg drop soup without pain and "that was encouraging." Patient states, "I don't want to take anymore medication, we are almost done, I will deal with it." She confirms the pain is only when she eats. Patient is scheduled for hydration on Friday in medical oncology. Patient understands to contact this RN with future needs. Also, patient understands she will be seen tomorrow for PUT with Dr. Tammi Klippel

## 2016-02-13 ENCOUNTER — Ambulatory Visit
Admission: RE | Admit: 2016-02-13 | Discharge: 2016-02-13 | Disposition: A | Discharge status: Home / Self Care | Payer: Medicare Other | Source: Ambulatory Visit | Attending: Radiation Oncology | Admitting: Radiation Oncology

## 2016-02-13 ENCOUNTER — Inpatient Hospital Stay
Admission: RE | Admit: 2016-02-13 | Discharge: 2016-02-13 | Disposition: A | Discharge status: Home / Self Care | Payer: Self-pay | Source: Ambulatory Visit | Attending: Radiation Oncology | Admitting: Radiation Oncology

## 2016-02-13 DIAGNOSIS — Z510 Encounter for antineoplastic radiation therapy: Secondary | ICD-10-CM | POA: Diagnosis not present

## 2016-02-13 DIAGNOSIS — C3491 Malignant neoplasm of unspecified part of right bronchus or lung: Secondary | ICD-10-CM | POA: Diagnosis not present

## 2016-02-14 ENCOUNTER — Ambulatory Visit
Admission: RE | Admit: 2016-02-14 | Discharge: 2016-02-14 | Disposition: A | Discharge status: Home / Self Care | Payer: Medicare Other | Source: Ambulatory Visit | Attending: Radiation Oncology | Admitting: Radiation Oncology

## 2016-02-14 ENCOUNTER — Ambulatory Visit (HOSPITAL_BASED_OUTPATIENT_CLINIC_OR_DEPARTMENT_OTHER): Payer: Medicare Other | Admitting: Nurse Practitioner

## 2016-02-14 DIAGNOSIS — C3431 Malignant neoplasm of lower lobe, right bronchus or lung: Secondary | ICD-10-CM

## 2016-02-14 DIAGNOSIS — Z510 Encounter for antineoplastic radiation therapy: Secondary | ICD-10-CM | POA: Diagnosis not present

## 2016-02-14 DIAGNOSIS — C3491 Malignant neoplasm of unspecified part of right bronchus or lung: Secondary | ICD-10-CM | POA: Diagnosis not present

## 2016-02-14 MED ORDER — SODIUM CHLORIDE 0.9 % IV SOLN
INTRAVENOUS | Status: DC
  Administered 2016-02-14: 12:00:00 via INTRAVENOUS

## 2016-02-14 NOTE — Patient Instructions (Signed)

## 2016-02-15 ENCOUNTER — Encounter: Payer: Self-pay | Admitting: Radiation Oncology

## 2016-02-17 ENCOUNTER — Other Ambulatory Visit: Payer: Medicare Other

## 2016-02-17 ENCOUNTER — Telehealth: Payer: Self-pay | Admitting: Internal Medicine

## 2016-02-17 ENCOUNTER — Ambulatory Visit (HOSPITAL_BASED_OUTPATIENT_CLINIC_OR_DEPARTMENT_OTHER): Payer: Medicare Other | Admitting: Internal Medicine

## 2016-02-17 ENCOUNTER — Ambulatory Visit
Admission: RE | Admit: 2016-02-17 | Discharge: 2016-02-17 | Disposition: A | Discharge status: Home / Self Care | Payer: Medicare Other | Source: Ambulatory Visit | Attending: Radiation Oncology | Admitting: Radiation Oncology

## 2016-02-17 ENCOUNTER — Encounter: Payer: Self-pay | Admitting: Internal Medicine

## 2016-02-17 ENCOUNTER — Encounter: Payer: Self-pay | Admitting: Radiation Oncology

## 2016-02-17 ENCOUNTER — Other Ambulatory Visit (HOSPITAL_BASED_OUTPATIENT_CLINIC_OR_DEPARTMENT_OTHER): Payer: Medicare Other

## 2016-02-17 ENCOUNTER — Ambulatory Visit: Payer: Medicare Other

## 2016-02-17 VITALS — BP 137/53 | HR 91 | Temp 98.1°F | Resp 18 | Ht 61.0 in | Wt 123.8 lb

## 2016-02-17 VITALS — BP 135/73 | HR 90 | Temp 98.0°F | Resp 18 | Ht 61.0 in | Wt 120.0 lb

## 2016-02-17 DIAGNOSIS — R530 Neoplastic (malignant) related fatigue: Secondary | ICD-10-CM | POA: Diagnosis not present

## 2016-02-17 DIAGNOSIS — C3431 Malignant neoplasm of lower lobe, right bronchus or lung: Secondary | ICD-10-CM

## 2016-02-17 DIAGNOSIS — R1310 Dysphagia, unspecified: Secondary | ICD-10-CM

## 2016-02-17 DIAGNOSIS — Z510 Encounter for antineoplastic radiation therapy: Secondary | ICD-10-CM | POA: Diagnosis not present

## 2016-02-17 DIAGNOSIS — Z85118 Personal history of other malignant neoplasm of bronchus and lung: Secondary | ICD-10-CM | POA: Diagnosis not present

## 2016-02-17 DIAGNOSIS — C3491 Malignant neoplasm of unspecified part of right bronchus or lung: Secondary | ICD-10-CM

## 2016-02-17 DIAGNOSIS — Z5111 Encounter for antineoplastic chemotherapy: Secondary | ICD-10-CM

## 2016-02-17 LAB — CBC WITH DIFFERENTIAL/PLATELET
BASO%: 0 % (ref 0.0–2.0)
BASOS ABS: 0 10*3/uL (ref 0.0–0.1)
EOS ABS: 0 10*3/uL (ref 0.0–0.5)
EOS%: 1.2 % (ref 0.0–7.0)
HEMATOCRIT: 30.5 % — AB (ref 34.8–46.6)
HEMOGLOBIN: 10.6 g/dL — AB (ref 11.6–15.9)
LYMPH#: 0.3 10*3/uL — AB (ref 0.9–3.3)
LYMPH%: 16.6 % (ref 14.0–49.7)
MCH: 28.5 pg (ref 25.1–34.0)
MCHC: 34.8 g/dL (ref 31.5–36.0)
MCV: 82 fL (ref 79.5–101.0)
MONO#: 0.2 10*3/uL (ref 0.1–0.9)
MONO%: 11.8 % (ref 0.0–14.0)
NEUT#: 1.2 10*3/uL — ABNORMAL LOW (ref 1.5–6.5)
NEUT%: 70.4 % (ref 38.4–76.8)
Platelets: 148 10*3/uL (ref 145–400)
RBC: 3.72 10*6/uL (ref 3.70–5.45)
RDW: 14.5 % (ref 11.2–14.5)
WBC: 1.7 10*3/uL — ABNORMAL LOW (ref 3.9–10.3)

## 2016-02-17 LAB — COMPREHENSIVE METABOLIC PANEL
ALBUMIN: 3.3 g/dL — AB (ref 3.5–5.0)
ALK PHOS: 73 U/L (ref 40–150)
ALT: 22 U/L (ref 0–55)
AST: 20 U/L (ref 5–34)
Anion Gap: 12 mEq/L — ABNORMAL HIGH (ref 3–11)
BUN: 11.2 mg/dL (ref 7.0–26.0)
CALCIUM: 9.4 mg/dL (ref 8.4–10.4)
CO2: 25 mEq/L (ref 22–29)
CREATININE: 0.6 mg/dL (ref 0.6–1.1)
Chloride: 103 mEq/L (ref 98–109)
EGFR: 81 mL/min/{1.73_m2} — ABNORMAL LOW (ref >90)
Glucose: 103 mg/dl (ref 70–140)
POTASSIUM: 3 meq/L — AB (ref 3.5–5.1)
Sodium: 140 mEq/L (ref 136–145)
Total Bilirubin: 0.5 mg/dL (ref 0.20–1.20)
Total Protein: 6.7 g/dL (ref 6.4–8.3)

## 2016-02-17 MED ORDER — RADIAPLEXRX EX GEL
Freq: Once | CUTANEOUS | Status: AC
  Administered 2016-02-17: 14:00:00 via TOPICAL

## 2016-02-17 NOTE — Progress Notes (Signed)
  Radiation Oncology         [phone]   Name: Samantha Quinn MRN: [id number]   Date: 02/17/2016  DOB: [birth year]   Weekly Radiation Therapy Management    ICD-9-CM ICD-10-CM   1. Primary adenocarcinoma of lower lobe of right lung (HCC) 162.5 C34.31 hyaluronate sodium (RADIAPLEXRX) gel    Current Dose: 62 Gy  Planned Dose: 66 Gy  Narrative The patient presents for routine under treatment assessment.  Weekly rad txs right lung. 31 completed. Per nursing, bright red rash dermatitis on back upper right chest; patient is using radiaplex gel bid, and has cortisone cream for prn use on back. Patient reports good appetite, though food does not taste as good. Patient does report some difficulty swallowing foods, and it takes a while for food to get down. Patient is taking carafate and eating mostly soft foods. Patient reports energy level has decreased over the past week, though she is still taking naps and rest periods during the day. Patient received IV fluid on Friday (02/14/16) and stated she "feels better."   Set-up films were reviewed. The chart was checked.  Physical Findings  height is '5\' 1"'$  (1.549 m) and weight is 120 lb (54.4 kg). Her oral temperature is 98 F (36.7 C). Her blood pressure is 135/73 and her pulse is 90. Her respiration is 18 and oxygen saturation is 98%.   Weight and vitals are stable. In general this is a well appearing caucasian female in no acute distress. She's alert and oriented x4 and appropriate throughout the examination.   Impression The patient is tolerating radiation.  Plan Continue treatment as planned. I discussed the possibility of continuing IV fluids if needed in the future.         Sheral Apley Tammi Klippel, M.D.  This document serves as a record of services personally performed by Tyler Pita, MD. It was created on his behalf by Maryla Morrow, a trained medical scribe. The creation of this record is based on the scribe's personal observations and  the provider's statements to them. This document has been checked and approved by the attending provider.

## 2016-02-17 NOTE — Progress Notes (Signed)
Weekly rad tx right lung, Has  31 completed. Bright rash dermatitis on back upper right  chest, using radiaplex gel bid, has cortisone cream for prn use on back for itching stated, appetite, pretty good food does not taste as good, Does have some difficulty when swallowing foods, takes a while for food to get through, taking carafate.  Eating mostly soup soft foods.   Energy  level has decreased over the past week, taking naps and rest periods during the day.  Received IV fluid on Friday 02-14-16 "stated she feels better".  EOT instructions given and one month follow up card given.  Will not have chemotherapy today.  See education area for documentation. Wt Readings from Last 3 Encounters:  02/17/16 123 lb 12.8 oz (56.2 kg)  02/17/16 120 lb (54.4 kg)  02/14/16 123 lb 9.6 oz (56.1 kg)  BP 135/73 (BP Location: Right Arm, Patient Position: Sitting, Cuff Size: Normal)   Pulse 90   Temp 98 F (36.7 C) (Oral)   Resp 18   Ht '5\' 1"'$  (1.549 m)   Wt 120 lb (54.4 kg)   SpO2 98%   BMI 22.67 kg/m

## 2016-02-17 NOTE — Telephone Encounter (Signed)
Samantha Quinn

## 2016-02-17 NOTE — Progress Notes (Signed)
[facility] [phone]   [phone]  OFFICE PROGRESS NOTE  Binnie Rail, MD [place] [address]  DIAGNOSIS:  recurrent non-small cell lung cancer, stage IIA (T1a, N1, M0) non-small cell lung cancer, adenocarcinoma presented with perihilar right lower lobe soft tissue mass with right hilar adenopathy diagnosed in August 2017. The patient has a history of stage IB (T2a, N0, M0) non-small cell lung cancer, adenocarcinoma diagnosed in March 2013.  PRIOR THERAPY: Status post right upper lobectomy with lymph node dissection. The tumor was negative for EGFR and ALK mutations.  CURRENT THERAPY: A course of concurrent chemoradiation with weekly carboplatin for AUC of 2 and paclitaxel 45 MG/M2. Status post 6 cycles. Last dose was given 02/10/2016.  INTERVAL HISTORY: Samantha Quinn 82 y.o. female returns to the clinic today for follow-up visit accompanied by her daughter. The patient is feeling fine today with no specific complaints except for mild odynophagia secondary to radiation induced esophagitis. She was started on treatment with Carafate by Dr. Tammi Klippel. She also received IV hydration last week. She denied having any significant chest pain, shortness of breath, cough or hemoptysis. She has no nausea or vomiting. She denied having any significant weight loss or night sweats. She had mild odynophagia. She is here today for evaluation before starting cycle #7.  MEDICAL HISTORY: Past Medical History:  Diagnosis Date  . Back pain    bulding disc  . Bronchitis    hx of-many,many years   . Diabetes mellitus   . Diabetes mellitus    type 2 and takes Metformin bid  . Dyslipidemia   . Embolism - blood clot    hx of in left lower leg and was on Coumadin for about 51month;this was about 8-146yrago  . Family history of adverse reaction to anesthesia    Daughter- Nausea  . History of colonic polyps   . HOH (hard of hearing)   . Hx of  migraines    as a teenager  . Hypercholesterolemia   . Hyperlipidemia    takes Lovastatin every other day  . Hypertension    takes Ziac daily and Ramipril as well  . Hypothyroidism    takes Synthroid every other day  . Insomnia    takes Elavil nightly  . Joint pain   . Lung mass   . Myalgia   . Osteoarthritis   . Osteoporosis   . Pneumothorax of right lung after biopsy 07/09/11   HAD CHEST TUBE PLACEMENT  . PONV (postoperative nausea and vomiting)   . Primary adenocarcinoma of lower lobe of right lung ([facility]   pT2a, pN0 M0 Stage 1 Well differenced Adenocarcinoma 3.5 cm resected 07/17/2011  . [place] adenocarcinoma of lung ([facility]2013  . Shortness of breath dyspnea    With exertion  . Skin cancer    legs have dark spots on them  . Upper respiratory infection 04/2011  . Urinary incontinence    takes Detrol daily prn    ALLERGIES:  is allergic to codeine; tramadol; and demerol.  MEDICATIONS:  Current Outpatient Prescriptions  Medication Sig Dispense Refill  . amitriptyline (ELAVIL) 10 MG tablet Take 1 tablet (10 mg total) by mouth at bedtime as needed. (Patient taking differently: Take 5 mg by mouth at bedtime. ) 90 tablet 1  . bisoprolol-hydrochlorothiazide (ZIAC) 2.5-6.25 MG tablet Take 1 tablet by mouth daily. 90 tablet 3  . Blood Glucose Monitoring Suppl (ONE TOUCH ULTRA 2)  w/Device KIT Use to check blood sugars twice a day Dx E11.9 1 each 0  . calcium carbonate (OS-CAL) 600 MG tablet Take 600 mg by mouth daily.    [name] gabapentin (NEURONTIN) 100 MG capsule Take 200 mg by mouth 3 (three) times daily.     [name] glucose blood (ONE TOUCH ULTRA TEST) test strip 1 each by Other route 2 (two) times daily. Use to check blood sugars twice a day Dx E11.9 100 each 3  . ibuprofen (ADVIL,MOTRIN) 200 MG tablet Take 200 mg by mouth every 6 (six) hours as needed for fever, headache or mild pain.     [name] levothyroxine (SYNTHROID, LEVOTHROID) 25 MCG tablet Take 1 tablet (25 mcg total)  by mouth daily before breakfast. 90 tablet 1  . lovastatin (MEVACOR) 20 MG tablet Take 1 tablet (20 mg total) by mouth every other day. 90 tablet 1  . meclizine (ANTIVERT) 25 MG tablet Take 25 mg by mouth 3 (three) times daily as needed for dizziness.    . metFORMIN (GLUCOPHAGE) 500 MG tablet Take 1 tablet (500 mg total) by mouth 2 (two) times daily with a meal. 180 tablet 3  . non-metallic deodorant (ALRA) MISC Apply 1 application topically daily as needed.    Glory Rosebush DELICA LANCETS FINE MISC Use one lancet to check blood sugar twice daily 100 each 3  . prochlorperazine (COMPAZINE) 10 MG tablet Take 1 tablet (10 mg total) by mouth every 6 (six) hours as needed for nausea or vomiting. 30 tablet 0  . ramipril (ALTACE) 2.5 MG capsule Take 1 capsule (2.5 mg total) by mouth daily. 90 capsule 2  . sucralfate (CARAFATE) 1 g tablet Take 1 tablet (1 g total) by mouth 4 (four) times daily -  with meals and at bedtime. 120 tablet 0  . Wound Cleansers (RADIAPLEX EX) Apply topically.     No current facility-administered medications for this visit.     SURGICAL HISTORY:  Past Surgical History:  Procedure Laterality Date  . ABDOMINAL HYSTERECTOMY    . APPENDECTOMY     as a child  . CHEST TUBE REMOVAL  07/13/11   RIGHT  CHEST TUBE  . CHOLECYSTECTOMY    . COLONOSCOPY    . COLONOSCOPY    . DILATION AND CURETTAGE OF UTERUS     x 2  . EYE SURGERY Bilateral    Cataract  . HEMORRHOID SURGERY    . LUNG BIOPSY  07/09/11   RIGHT UPER LOBE LUNG MASS   . RIGHT CHEST TUBE PLACEMENT  07/10/11   S/P LUNG BX  . ROTATOR CUFF REPAIR     bilateral  . TOE SURGERY  2013   right small toe  . TONSILLECTOMY     as a child  . VIDEO BRONCHOSCOPY  07/17/2011   Procedure: VIDEO BRONCHOSCOPY;  Surgeon: Grace Isaac, MD;  Location: [facility];  Service: Thoracic;  Laterality: N/A;  . VIDEO BRONCHOSCOPY WITH ENDOBRONCHIAL ULTRASOUND N/A 12/10/2015   Procedure: VIDEO BRONCHOSCOPY WITH ENDOBRONCHIAL ULTRASOUND with  biopsies.;  Surgeon: Grace Isaac, MD;  Location[facility] OR;  Service: Thoracic;  Laterality: N/A;    REVIEW OF SYSTEMS:  A comprehensive review of systems was negative except for: Constitutional: positive for fatigue Gastrointestinal: positive for odynophagia   PHYSICAL EXAMINATION: General appearance: alert, cooperative and no distress Head: Normocephalic, without obvious abnormality, atraumatic Neck: no adenopathy, no JVD, supple, symmetrical, trachea midline and thyroid not enlarged, symmetric, no tenderness/mass/nodules Lymph nodes: Cervical, supraclavicular, and axillary nodes  normal. Resp: clear to auscultation bilaterally Back: symmetric, no curvature. ROM normal. No CVA tenderness. Cardio: regular rate and rhythm, S1, S2 normal, no murmur, click, rub or gallop GI: soft, non-tender; bowel sounds normal; no masses,  no organomegaly Extremities: extremities normal, atraumatic, no cyanosis or edema Neurologic: Alert and oriented X 3, normal strength and tone. Normal symmetric reflexes. Normal coordination and gait  ECOG PERFORMANCE STATUS: 1 - Symptomatic but completely ambulatory  Blood pressure (!) 137/53, pulse 91, temperature 98.1 F (36.7 C), temperature source Oral, resp. rate 18, height '5\' 1"'  (1.549 m), weight 123 lb 12.8 oz (56.2 kg), SpO2 99 %.  LABORATORY DATA: Lab Results  Component Value Date   WBC 1.7 (L) 02/17/2016   HGB 10.6 (L) 02/17/2016   HCT 30.5 (L) 02/17/2016   MCV 82.0 02/17/2016   PLT 148 02/17/2016      Chemistry      Component Value Date/Time   NA 140 02/17/2016 1154   K 3.0 (LL) 02/17/2016 1154   CL 103 12/10/2015 0758   CO2 25 02/17/2016 1154   BUN 11.2 02/17/2016 1154   CREATININE 0.6 02/17/2016 1154      Component Value Date/Time   CALCIUM 9.4 02/17/2016 1154   ALKPHOS 73 02/17/2016 1154   AST 20 02/17/2016 1154   ALT 22 02/17/2016 1154   BILITOT 0.50 02/17/2016 1154       RADIOGRAPHIC STUDIES: No results found.  ASSESSMENT AND  PLAN: This is a very pleasant 82 years old white female recently diagnosed with unresectable stage IIA non-small cell lung cancer. The patient is currently undergoing a course of concurrent chemoradiation with weekly carboplatin and paclitaxel. Status post 6 cycles. She tolerated the first 6 cycles fairly well with no significant adverse effects except for mild odynophagia and fatigue. She is complaining of fatigue and weakness as well as the odynophagia. Her last fraction of radiotherapy is in 2 days. I recommended for the patient to discontinue her chemotherapy at this point. For the odynophagia, will continue on Carafate. I will see her back for follow-up visit in 6 weeks with repeat CT scan of the chest for restaging of her disease. She was advised to call immediately if she has any concerning symptoms in the interval. The patient voices understanding of current disease status and treatment options and is in agreement with the current care plan.  All questions were answered. The patient knows to call the clinic with any problems, questions or concerns. We can certainly see the patient much sooner if necessary.  Disclaimer: This note was dictated with voice recognition software. Similar sounding words can inadvertently be transcribed and may not be corrected upon review.

## 2016-02-17 NOTE — Telephone Encounter (Signed)
Pt confirmed appt, avs recieved

## 2016-02-18 ENCOUNTER — Ambulatory Visit
Admission: RE | Admit: 2016-02-18 | Discharge: 2016-02-18 | Disposition: A | Discharge status: Home / Self Care | Payer: Medicare Other | Source: Ambulatory Visit | Attending: Radiation Oncology | Admitting: Radiation Oncology

## 2016-02-18 DIAGNOSIS — C3491 Malignant neoplasm of unspecified part of right bronchus or lung: Secondary | ICD-10-CM | POA: Diagnosis not present

## 2016-02-18 DIAGNOSIS — Z510 Encounter for antineoplastic radiation therapy: Secondary | ICD-10-CM | POA: Diagnosis not present

## 2016-02-19 ENCOUNTER — Ambulatory Visit
Admission: RE | Admit: 2016-02-19 | Discharge: 2016-02-19 | Disposition: A | Discharge status: Home / Self Care | Payer: Medicare Other | Source: Ambulatory Visit | Attending: Radiation Oncology | Admitting: Radiation Oncology

## 2016-02-19 ENCOUNTER — Encounter: Payer: Self-pay | Admitting: Radiation Oncology

## 2016-02-19 DIAGNOSIS — C3491 Malignant neoplasm of unspecified part of right bronchus or lung: Secondary | ICD-10-CM | POA: Diagnosis not present

## 2016-02-19 DIAGNOSIS — Z510 Encounter for antineoplastic radiation therapy: Secondary | ICD-10-CM | POA: Diagnosis not present

## 2016-02-23 NOTE — Progress Notes (Signed)
  Radiation Oncology         [phone] ________________________________  Name: Samantha Quinn MRN: [id number]  Date: 02/19/2016  DOB: 11-04-1927   End of Treatment Note  Diagnosis:   82 yo woman with Recurrent  Lung cancer, re-staged as recurrent IIA, T1b, N1, M0 adenocarcinoma of the right upper lung     Indication for treatment:  Curative, Chemo-Radiotherapy       Radiation treatment dates:   01/06/16-02/19/16  Site/dose:   The primary tumor and involved mediastinal adenopathy were treated to 66 Gy in 33 fractions of 2 Gy.  Beams/energy:   A five field 3D conformal treatment arrangement was used delivering 6 and 10 MV photons.  Daily image-guidance CT was used to align the treatment with the targeted volume  Narrative: The patient tolerated radiation treatment relatively well.  The patient experienced some esophagitis characterized as moderate.  The patient also noted fatigue.  Plan: The patient has completed radiation treatment. The patient will return to radiation oncology clinic for routine followup in one month. I advised her to call or return sooner if she has any questions or concerns related to her recovery or treatment.  ________________________________  Sheral Apley. Tammi Klippel, M.D.

## 2016-02-26 ENCOUNTER — Encounter (INDEPENDENT_AMBULATORY_CARE_PROVIDER_SITE_OTHER): Payer: Medicare Other | Admitting: Ophthalmology

## 2016-02-26 DIAGNOSIS — E11319 Type 2 diabetes mellitus with unspecified diabetic retinopathy without macular edema: Secondary | ICD-10-CM | POA: Diagnosis not present

## 2016-02-26 DIAGNOSIS — H43813 Vitreous degeneration, bilateral: Secondary | ICD-10-CM | POA: Diagnosis not present

## 2016-02-26 DIAGNOSIS — E113591 Type 2 diabetes mellitus with proliferative diabetic retinopathy without macular edema, right eye: Secondary | ICD-10-CM | POA: Diagnosis not present

## 2016-02-26 DIAGNOSIS — I10 Essential (primary) hypertension: Secondary | ICD-10-CM

## 2016-02-26 DIAGNOSIS — H35033 Hypertensive retinopathy, bilateral: Secondary | ICD-10-CM | POA: Diagnosis not present

## 2016-02-26 DIAGNOSIS — H353132 Nonexudative age-related macular degeneration, bilateral, intermediate dry stage: Secondary | ICD-10-CM

## 2016-02-26 LAB — HM DIABETES EYE EXAM

## 2016-03-04 ENCOUNTER — Other Ambulatory Visit (INDEPENDENT_AMBULATORY_CARE_PROVIDER_SITE_OTHER): Payer: Medicare Other | Admitting: Ophthalmology

## 2016-03-05 ENCOUNTER — Encounter: Payer: Self-pay | Admitting: Internal Medicine

## 2016-03-08 NOTE — Patient Instructions (Addendum)
  Prescriptions for blood work were given to you. You can have this done with oncology.    Medications reviewed and updated.  Changes include decreasing metformin to once a day.  We will also discontinue the levothyroxine for now.    Please followup in 6 months, sooner if needed

## 2016-03-08 NOTE — Progress Notes (Signed)
Subjective:    Patient ID: Samantha Quinn, female    DOB: 10-12-1927, 82 y.o.   MRN: [id number]  HPI The patient is here for follow up.  Lung cancer:  She has a history of non-small cell lung cancer that was diagnosed in 2013 and she had RUL lobectomy at that time.  Her cancer recurred in august 2017 in the perihilar right lower lobe soft tissue.  She is had chemotherapy (completed 02/10/16) and has just finished radiation.  From chemoradiation she had fatigue, weakness and odynophagia.   Her energy level is improving.  Her odynophagia has resolved.  In a few weeks she will have a CT scan for restaging.    Diabetes: She is taking her medication daily as prescribed. She is compliant with a diabetic diet. She is not currently exercising regularly. She monitors her sugars and they have been running 142 yesterday. She is up-to-date with an ophthalmology examination.   Hypothyroidism:  She is taking her medication daily.  She denies any recent changes in energy or weight that are unexplained.  She has her tsh done by oncology.    Hypertension: She is taking her medication daily. She is compliant with a low sodium diet.  She denies chest pain, palpitations and regular headaches. She has had LE edema after receiving IVF at the hospital.  She is not exercising regularly.  She does not monitor her blood pressure at home.    Medications and allergies reviewed with patient and updated if appropriate.  Patient Active Problem List   Diagnosis Date Noted  . Encounter for antineoplastic chemotherapy 12/19/2015  . Lung mass 12/13/2015  . Meralgia paresthetica of left side 07/10/2015  . Osteoporosis 07/10/2015  . Onychomycosis 07/10/2015  . Anxiety 07/10/2015  . Diabetes ([place]) 07/10/2015  . Cough 12/12/2013  . Multinodular goiter (nontoxic) 09/29/2012  . Pneumothorax after biopsy 07/09/2011    Class: Acute  . Primary adenocarcinoma of lower lobe of right lung ([place]) 06/24/2011  . Hypercholesterolemia  10/20/2010  . Essential hypertension, benign 10/20/2010  . Hypothyroidism (acquired) 10/20/2010  . Osteoarthritis 10/20/2010    Current Outpatient Prescriptions on File Prior to Visit  Medication Sig Dispense Refill  . amitriptyline (ELAVIL) 10 MG tablet Take 1 tablet (10 mg total) by mouth at bedtime as needed. (Patient taking differently: Take 5 mg by mouth at bedtime. ) 90 tablet 1  . bisoprolol-hydrochlorothiazide (ZIAC) 2.5-6.25 MG tablet Take 1 tablet by mouth daily. 90 tablet 3  . Blood Glucose Monitoring Suppl (ONE TOUCH ULTRA 2) w/Device KIT Use to check blood sugars twice a day Dx E11.9 1 each 0  . calcium carbonate (OS-CAL) 600 MG tablet Take 600 mg by mouth daily.    [name] gabapentin (NEURONTIN) 100 MG capsule Take 200 mg by mouth 3 (three) times daily.     [name] glucose blood (ONE TOUCH ULTRA TEST) test strip 1 each by Other route 2 (two) times daily. Use to check blood sugars twice a day Dx E11.9 100 each 3  . ibuprofen (ADVIL,MOTRIN) 200 MG tablet Take 200 mg by mouth every 6 (six) hours as needed for fever, headache or mild pain.     [name] levothyroxine (SYNTHROID, LEVOTHROID) 25 MCG tablet Take 1 tablet (25 mcg total) by mouth daily before breakfast. 90 tablet 1  . lovastatin (MEVACOR) 20 MG tablet Take 1 tablet (20 mg total) by mouth every other day. 90 tablet 1  . meclizine (ANTIVERT) 25 MG tablet Take 25 mg by mouth 3 (  three) times daily as needed for dizziness.    . metFORMIN (GLUCOPHAGE) 500 MG tablet Take 1 tablet (500 mg total) by mouth 2 (two) times daily with a meal. 180 tablet 3  . non-metallic deodorant (ALRA) MISC Apply 1 application topically daily as needed.    Glory Rosebush DELICA LANCETS FINE MISC Use one lancet to check blood sugar twice daily 100 each 3  . ramipril (ALTACE) 2.5 MG capsule Take 1 capsule (2.5 mg total) by mouth daily. 90 capsule 2  . Wound Cleansers (RADIAPLEX EX) Apply topically.     No current facility-administered medications on file prior to visit.       Past Medical History:  Diagnosis Date  . Back pain    bulding disc  . Bronchitis    hx of-many,many years   . Diabetes mellitus   . Diabetes mellitus    type 2 and takes Metformin bid  . Dyslipidemia   . Embolism - blood clot    hx of in left lower leg and was on Coumadin for about 33month;this was about 8-173yrago  . Family history of adverse reaction to anesthesia    Daughter- Nausea  . History of colonic polyps   . HOH (hard of hearing)   . Hx of migraines    as a teenager  . Hypercholesterolemia   . Hyperlipidemia    takes Lovastatin every other day  . Hypertension    takes Ziac daily and Ramipril as well  . Hypothyroidism    takes Synthroid every other day  . Insomnia    takes Elavil nightly  . Joint pain   . Lung mass   . Myalgia   . Osteoarthritis   . Osteoporosis   . Pneumothorax of right lung after biopsy 07/09/11   HAD CHEST TUBE PLACEMENT  . PONV (postoperative nausea and vomiting)   . Primary adenocarcinoma of lower lobe of right lung ([facility]   pT2a, pN0 M0 Stage 1 Well differenced Adenocarcinoma 3.5 cm resected 07/17/2011  . Primary adenocarcinoma of lung ([facility]  . Shortness of breath dyspnea    With exertion  . Skin cancer    legs have dark spots on them  . Upper respiratory infection 04/2011  . Urinary incontinence    takes Detrol daily prn    Past Surgical History:  Procedure Laterality Date  . ABDOMINAL HYSTERECTOMY    . APPENDECTOMY     as a child  . CHEST TUBE REMOVAL  07/13/11   RIGHT  CHEST TUBE  . CHOLECYSTECTOMY    . COLONOSCOPY    . COLONOSCOPY    . DILATION AND CURETTAGE OF UTERUS     x 2  . EYE SURGERY Bilateral    Cataract  . HEMORRHOID SURGERY    . LUNG BIOPSY  07/09/11   RIGHT UPER LOBE LUNG MASS   . RIGHT CHEST TUBE PLACEMENT  07/10/11   S/P LUNG BX  . ROTATOR CUFF REPAIR     bilateral  . TOE SURGERY  2013   right small toe  . TONSILLECTOMY     as a child  . VIDEO BRONCHOSCOPY  07/17/2011    Procedure: VIDEO BRONCHOSCOPY;  Surgeon: EdGrace IsaacMD;  [facility] Service: Thor[facility];  Laterality: N/A;  . VIDEO BRONCHOSCOPY WITH ENDOBRONCHIAL ULTRASOUND N/A 12/10/2015   Procedure: VIDEO BRONCHOSCOPY WITH ENDOBRONCHIAL ULTRASOUND with biopsies.;  Surgeon: EdGrace IsaacMD;  [facility] Service: Thor[facility];  Laterality: N/A;  Social History   Social History  . Marital status: Widowed    Spouse name: N/A  . Number of children: N/A  . Years of education: N/A   Social History Main Topics  . Smoking status: Never Smoker  . Smokeless tobacco: Never Used  . Alcohol use No  . Drug use: No  . Sexual activity: Yes    Birth control/ protection: Surgical   Other Topics Concern  . Not on file   Social History Narrative  . No narrative on file    Family History  Problem Relation Age of Onset  . Hypertension Mother   . Hypertension Father   . Arthritis Sister   . Arthritis Sister   . Diabetes Brother   . Hypertension Sister   . Anesthesia problems Neg Hx   . Hypotension Neg Hx   . Malignant hyperthermia Neg Hx   . Pseudochol deficiency Neg Hx   . Heart attack Neg Hx     Review of Systems  Constitutional: Positive for fatigue. Negative for chills and fever.  HENT: Positive for postnasal drip. Negative for trouble swallowing.   Respiratory: Positive for cough (chronic) and shortness of breath (with actvitiy - chronic). Negative for wheezing.   Cardiovascular: Positive for leg swelling. Negative for chest pain and palpitations.  Gastrointestinal:       Occ gerd  Musculoskeletal: Positive for back pain.  Neurological: Negative for light-headedness and headaches.  Psychiatric/Behavioral: Negative for dysphoric mood. The patient is not nervous/anxious.        Objective:   Vitals:   03/09/16 1033  BP: 112/76  Pulse: 83  Resp: 16  Temp: 98.4 F (36.9 C)   Filed Weights   03/09/16 1033  Weight: 128 lb (58.1 kg)   Body mass index is 24.19  kg/m.   Physical Exam    Constitutional: chronically ill appearing. No distress.  HENT:  Head: Normocephalic and atraumatic.  Neck: Neck supple. No tracheal deviation present. Right sided thyromegaly present.  No cervical lymphadenopathy Cardiovascular: Normal rate, regular rhythm and normal heart sounds.   No murmur heard. No carotid bruit .  trace edema Pulmonary/Chest: Effort normal and breath sounds normal. No respiratory distress. No has no wheezes. No rales.  Skin: Skin is warm and dry. Not diaphoretic.  Psychiatric: Normal mood and affect. Behavior is normal.     Assessment & Plan:    See Problem List for Assessment and Plan of chronic medical problems.   F/u in 6 months

## 2016-03-09 ENCOUNTER — Encounter: Payer: Self-pay | Admitting: Internal Medicine

## 2016-03-09 ENCOUNTER — Ambulatory Visit (INDEPENDENT_AMBULATORY_CARE_PROVIDER_SITE_OTHER): Payer: Medicare Other | Admitting: Internal Medicine

## 2016-03-09 ENCOUNTER — Telehealth: Payer: Self-pay | Admitting: Medical Oncology

## 2016-03-09 VITALS — BP 112/76 | HR 83 | Temp 98.4°F | Resp 16 | Ht 61.0 in | Wt 128.0 lb

## 2016-03-09 DIAGNOSIS — E119 Type 2 diabetes mellitus without complications: Secondary | ICD-10-CM | POA: Diagnosis not present

## 2016-03-09 DIAGNOSIS — I10 Essential (primary) hypertension: Secondary | ICD-10-CM

## 2016-03-09 DIAGNOSIS — E039 Hypothyroidism, unspecified: Secondary | ICD-10-CM | POA: Diagnosis not present

## 2016-03-09 DIAGNOSIS — H3530 Unspecified macular degeneration: Secondary | ICD-10-CM | POA: Insufficient documentation

## 2016-03-09 MED ORDER — METFORMIN HCL 500 MG PO TABS: 500.0000 mg | ORAL_TABLET | Freq: Every day | ORAL | 3 refills | Status: DC

## 2016-03-09 NOTE — Assessment & Plan Note (Addendum)
Stopped levothyroxine 03/09/16 - tsh low last month Will monitor tsh off medication - recheck in 6-8 weeks

## 2016-03-09 NOTE — Progress Notes (Signed)
Pre visit review using our clinic review tool, if applicable. No additional management support is needed unless otherwise documented below in the visit note. 

## 2016-03-09 NOTE — Telephone Encounter (Signed)
Will you order molecular testing for her-last tx 11/30

## 2016-03-09 NOTE — Assessment & Plan Note (Signed)
BP well controlled Current regimen effective and well tolerated Continue current medications at current doses  

## 2016-03-09 NOTE — Assessment & Plan Note (Signed)
Sugars well controlled Decreased metformin to once a day Monitor a1c - may be able to discontinue metformin completely in near future

## 2016-03-17 NOTE — Progress Notes (Signed)
Mrs. Samantha Quinn is here for a one month follow up appointment for recurrent adenocarcinoma of the right upper lung.  Weight changes, if any: Wt Readings from Last 3 Encounters:  03/24/16 123 lb (55.8 kg)  03/23/16 127 lb (57.6 kg)  03/09/16 128 lb (58.1 kg)   Respiratory complaints, if any: SOB with exertion or if she walks a long distance, coughing nonproductive Hemoptysis, if any:  None Swallowing Problems/Pain/Difficulty swallowing: Has a sore throat from a cold, saw her PCP yesterday has Amoxicillin ordered if she is not feeling better by tomorrow, using chloraseptic spray and atrovent inhaler Smoking Tobacco/Marijuana/Snuff/ETOH use: Never a smoker, no alcohol or drug usage Skin:Normal skin color to chest back with mild hyperpigmentation Pain : 8/10 throat pain lorgenzers Appetite: Good Fatigue:Having fatigue When is next chemo scheduled?:  chemoradiation with weekly carboplatin and paclitaxel complteted Lab work from of chart:None BP 123/68   Pulse 88   Temp 98.7 F (37.1 C) (Oral)   Resp 18   Ht '5\' 1"'$  (1.549 m)   Wt 123 lb (55.8 kg)   BMI 23.24 kg/m

## 2016-03-23 ENCOUNTER — Ambulatory Visit (INDEPENDENT_AMBULATORY_CARE_PROVIDER_SITE_OTHER): Payer: Medicare Other | Admitting: Nurse Practitioner

## 2016-03-23 ENCOUNTER — Encounter: Payer: Self-pay | Admitting: Nurse Practitioner

## 2016-03-23 VITALS — BP 120/78 | HR 92 | Temp 98.9°F | Ht 61.0 in | Wt 127.0 lb

## 2016-03-23 DIAGNOSIS — J069 Acute upper respiratory infection, unspecified: Secondary | ICD-10-CM | POA: Diagnosis not present

## 2016-03-23 MED ORDER — AMOXICILLIN-POT CLAVULANATE 875-125 MG PO TABS: 1.0000 | ORAL_TABLET | Freq: Two times a day (BID) | ORAL | 0 refills | Status: DC

## 2016-03-23 MED ORDER — BENZOCAINE-MENTHOL 15-10 MG MT LOZG: 1.0000 | LOZENGE | OROMUCOSAL | 0 refills | Status: DC | PRN

## 2016-03-23 MED ORDER — DM-GUAIFENESIN ER 30-600 MG PO TB12: 1.0000 | ORAL_TABLET | Freq: Two times a day (BID) | ORAL | 0 refills | Status: DC | PRN

## 2016-03-23 MED ORDER — IPRATROPIUM BROMIDE 0.03 % NA SOLN: 2.0000 | Freq: Two times a day (BID) | NASAL | 0 refills | Status: DC

## 2016-03-23 NOTE — Patient Instructions (Signed)
[facility] Instructions: Use over-the-counter  "cold" medicines  such as "Tylenol cold" , "Advil cold",  "Mucinex" or" Mucinex D"  for cough and congestion.  Avoid decongestants if you have high blood pressure. Use" Delsym" or" Robitussin" cough syrup varietis for cough.  You can use plain "Tylenol" or "Advi"l for fever, chills and achyness.   "Common cold" symptoms are usually triggered by a virus.  The antibiotics are usually not necessary. On average, a" viral cold" illness would take 4-7 days to resolve. Please, make an appointment if you are not better or if you're worse.   Start Augmentin if no improvement in 3days. Encourage adequate oral hydration.

## 2016-03-23 NOTE — Progress Notes (Signed)
Pre visit review using our clinic review tool, if applicable. No additional management support is needed unless otherwise documented below in the visit note. 

## 2016-03-23 NOTE — Progress Notes (Signed)
Subjective:  Patient ID: Samantha Quinn, female    DOB: 1928-03-26  Age: 82 y.o. MRN: [id number]  CC: Sore Throat (pt stated sore throat,cough for 2 days. )   Sore Throat   This is a new problem. The current episode started yesterday. The problem has been gradually worsening. The maximum temperature recorded prior to her arrival was 100.4 - 100.9 F. The fever has been present for less than 1 day. Associated symptoms include congestion, coughing, a hoarse voice, a plugged ear sensation and swollen glands. Pertinent negatives include no drooling, ear discharge, ear pain, headaches, neck pain, shortness of breath, stridor, trouble swallowing or vomiting. She has had no exposure to strep or mono. She has tried acetaminophen for the symptoms. The treatment provided mild relief.  completed chemotherapy and radiation 4weeks ago.  Outpatient Medications Prior to Visit  Medication Sig Dispense Refill  . amitriptyline (ELAVIL) 10 MG tablet Take 1 tablet (10 mg total) by mouth at bedtime as needed. (Patient taking differently: Take 5 mg by mouth at bedtime. ) 90 tablet 1  . bisoprolol-hydrochlorothiazide (ZIAC) 2.5-6.25 MG tablet Take 1 tablet by mouth daily. 90 tablet 3  . Blood Glucose Monitoring Suppl (ONE TOUCH ULTRA 2) w/Device KIT Use to check blood sugars twice a day Dx E11.9 1 each 0  . calcium carbonate (OS-CAL) 600 MG tablet Take 600 mg by mouth daily.    [name] gabapentin (NEURONTIN) 100 MG capsule Take 200 mg by mouth 3 (three) times daily.     [name] glucose blood (ONE TOUCH ULTRA TEST) test strip 1 each by Other route 2 (two) times daily. Use to check blood sugars twice a day Dx E11.9 100 each 3  . ibuprofen (ADVIL,MOTRIN) 200 MG tablet Take 200 mg by mouth every 6 (six) hours as needed for fever, headache or mild pain.     [name] lovastatin (MEVACOR) 20 MG tablet Take 1 tablet (20 mg total) by mouth every other day. 90 tablet 1  . meclizine (ANTIVERT) 25 MG tablet Take 25 mg by mouth 3 (three) times  daily as needed for dizziness.    . metFORMIN (GLUCOPHAGE) 500 MG tablet Take 1 tablet (500 mg total) by mouth daily with breakfast. 90 tablet 3  . non-metallic deodorant (ALRA) MISC Apply 1 application topically daily as needed.    Glory Rosebush DELICA LANCETS FINE MISC Use one lancet to check blood sugar twice daily 100 each 3  . ramipril (ALTACE) 2.5 MG capsule Take 1 capsule (2.5 mg total) by mouth daily. 90 capsule 2  . Wound Cleansers (RADIAPLEX EX) Apply topically.     No facility-administered medications prior to visit.     ROS See HPI  Objective:  BP 120/78   Pulse 92   Temp 98.9 F (37.2 C) (Oral)   Ht _0  (1.549 m)   Wt 127 lb (57.6 kg)   SpO2 93%   BMI 24.00 kg/m   BP Readings from Last 3 Encounters:  03/23/16 120/78  03/09/16 112/76  02/17/16 (!) 137/53    Wt Readings from Last 3 Encounters:  03/23/16 127 lb (57.6 kg)  03/09/16 128 lb (58.1 kg)  02/17/16 123 lb 12.8 oz (56.2 kg)    Physical Exam  Constitutional: She is oriented to person, place, and time. No distress.  HENT:  Right Ear: Tympanic membrane, external ear and ear canal normal.  Left Ear: Tympanic membrane, external ear and ear canal normal.  Nose: Mucosal edema and rhinorrhea present. Right sinus exhibits  no maxillary sinus tenderness and no frontal sinus tenderness. Left sinus exhibits no maxillary sinus tenderness and no frontal sinus tenderness.  Mouth/Throat: Uvula is midline. Posterior oropharyngeal erythema present. No oropharyngeal exudate.  Neck: Normal range of motion. Neck supple. Thyromegaly present.  Right thyroid non tender nodule (chronic per patient).  Cardiovascular: Normal rate and normal heart sounds.   Pulmonary/Chest: Effort normal and breath sounds normal.  Lymphadenopathy:    She has cervical adenopathy.  Neurological: She is alert and oriented to person, place, and time.  Skin: Skin is warm and dry.  Vitals reviewed.   Lab Results  Component Value Date   WBC 1.7  (L) 02/17/2016   HGB 10.6 (L) 02/17/2016   HCT 30.5 (L) 02/17/2016   PLT 148 02/17/2016   GLUCOSE 103 02/17/2016   CHOL 179 11/12/2015   TRIG 180.0 (H) 11/12/2015   HDL 50.20 11/12/2015   LDLCALC 93 11/12/2015   ALT 22 02/17/2016   AST 20 02/17/2016   NA 140 02/17/2016   K 3.0 (LL) 02/17/2016   CL 103 12/10/2015   CREATININE 0.6 02/17/2016   BUN 11.2 02/17/2016   CO2 25 02/17/2016   TSH <0.080 (L) 01/27/2016   INR 0.92 12/10/2015   HGBA1C 6.3 11/12/2015    No results found.  Assessment & Plan:   Samantha Quinn was seen today for sore throat.  Diagnoses and all orders for this visit:  Acute URI -     ipratropium (ATROVENT) 0.03 % nasal spray; Place 2 sprays into both nostrils 2 (two) times daily. Do not use for more than 5days. -     dextromethorphan-guaiFENesin (MUCINEX DM) 30-600 MG 12hr tablet; Take 1 tablet by mouth 2 (two) times daily as needed for cough. -     amoxicillin-clavulanate (AUGMENTIN) 875-125 MG tablet; Take 1 tablet by mouth 2 (two) times daily. -     Benzocaine-Menthol (CHLORASEPTIC MAX SORE THROAT) 15-10 MG LOZG; Use as directed 1 tablet in the mouth or throat as needed.   I am having Samantha Quinn start on ipratropium, dextromethorphan-guaiFENesin, amoxicillin-clavulanate, and Benzocaine-Menthol. I am also having her maintain her ibuprofen, meclizine, gabapentin, ramipril, lovastatin, bisoprolol-hydrochlorothiazide, amitriptyline, calcium carbonate, ONE TOUCH ULTRA 2, glucose blood, Wound Cleansers (RADIAPLEX EX), non-metallic deodorant, ONETOUCH DELICA LANCETS FINE, and metFORMIN.  Meds ordered this encounter  Medications  . ipratropium (ATROVENT) 0.03 % nasal spray    Sig: Place 2 sprays into both nostrils 2 (two) times daily. Do not use for more than 5days.    Dispense:  30 mL    Refill:  0    Order Specific Question:   Supervising Provider    Answer:   Cassandria Anger [[id number]]  . dextromethorphan-guaiFENesin (MUCINEX DM) 30-600 MG 12hr tablet    Sig: Take  1 tablet by mouth 2 (two) times daily as needed for cough.    Dispense:  14 tablet    Refill:  0    Order Specific Question:   Supervising Provider    Answer:   Cassandria Anger [[id number]]  . amoxicillin-clavulanate (AUGMENTIN) 875-125 MG tablet    Sig: Take 1 tablet by mouth 2 (two) times daily.    Dispense:  14 tablet    Refill:  0    Order Specific Question:   Supervising Provider    Answer:   Cassandria Anger [[id number]]  . Benzocaine-Menthol (CHLORASEPTIC MAX SORE THROAT) 15-10 MG LOZG    Sig: Use as directed 1 tablet in the mouth or throat as needed.  Dispense:  30 lozenge    Refill:  0    Order Specific Question:   Supervising Provider    Answer:   Cassandria Anger [[id number]]    Follow-up: No Follow-up on file.  Wilfred Lacy, NP

## 2016-03-24 ENCOUNTER — Encounter: Payer: Self-pay | Admitting: Radiation Oncology

## 2016-03-24 ENCOUNTER — Ambulatory Visit
Admission: RE | Admit: 2016-03-24 | Discharge: 2016-03-24 | Disposition: A | Discharge status: Home / Self Care | Payer: Medicare Other | Source: Ambulatory Visit | Attending: Radiation Oncology | Admitting: Radiation Oncology

## 2016-03-24 VITALS — BP 123/68 | HR 88 | Temp 98.7°F | Resp 18 | Ht 61.0 in | Wt 123.0 lb

## 2016-03-24 DIAGNOSIS — R590 Localized enlarged lymph nodes: Secondary | ICD-10-CM | POA: Insufficient documentation

## 2016-03-24 DIAGNOSIS — C3431 Malignant neoplasm of lower lobe, right bronchus or lung: Secondary | ICD-10-CM | POA: Diagnosis not present

## 2016-03-24 DIAGNOSIS — J029 Acute pharyngitis, unspecified: Secondary | ICD-10-CM | POA: Insufficient documentation

## 2016-03-24 DIAGNOSIS — J700 Acute pulmonary manifestations due to radiation: Secondary | ICD-10-CM | POA: Diagnosis not present

## 2016-03-24 DIAGNOSIS — R0602 Shortness of breath: Secondary | ICD-10-CM | POA: Insufficient documentation

## 2016-03-24 DIAGNOSIS — Z885 Allergy status to narcotic agent status: Secondary | ICD-10-CM | POA: Diagnosis not present

## 2016-03-24 DIAGNOSIS — Z7984 Long term (current) use of oral hypoglycemic drugs: Secondary | ICD-10-CM | POA: Diagnosis not present

## 2016-03-24 DIAGNOSIS — Z923 Personal history of irradiation: Secondary | ICD-10-CM | POA: Diagnosis not present

## 2016-03-24 DIAGNOSIS — Z79899 Other long term (current) drug therapy: Secondary | ICD-10-CM | POA: Diagnosis not present

## 2016-03-24 DIAGNOSIS — Y842 Radiological procedure and radiotherapy as the cause of abnormal reaction of the patient, or of later complication, without mention of misadventure at the time of the procedure: Secondary | ICD-10-CM | POA: Diagnosis not present

## 2016-03-24 NOTE — Addendum Note (Signed)
Encounter addended by: Malena Edman, RN on: 03/24/2016 11:37 AM<BR>    Actions taken: Charge Capture section accepted

## 2016-03-24 NOTE — Progress Notes (Addendum)
Radiation Oncology         [phone] ________________________________  Name: Samantha Quinn MRN: [id number]  Date: 03/24/2016  DOB: Mar 09, 1928  Post Treatment Note  CC: Binnie Rail, MD  Grace Isaac, MD  Diagnosis:   Stage IIA, T1a, N1, M0 NSCLC, adenocarcinoma of the right lower lobe  Interval Since Last Radiation:  4 weeks   01/06/16-02/19/16: The right lower lobe lung tumor and involved mediastinal adenopathy were treated to 66 Gy in 33 fractions of 2 Gy.   Narrative:  The patient returns today for routine follow-up. She is doing well since completing radiotherapy and did not have significant esophagitis. She states however that Saturday she woke up with a very sore throat and post nasal drip. She saw a provider in her PCP office yesterday and was given a prescription for antibiotics if her symptoms did not improve by tomorrow. She denies any cough or productive mucous, she denies any fevers, chills, nausea or vomiting. She has noticed some shortness of breath with exertion but reports this is unchanged since her radiotherapy. No other complaints are noted.                            ALLERGIES:  is allergic to codeine; tramadol; and demerol.  Meds: Current Outpatient Prescriptions  Medication Sig Dispense Refill  . amitriptyline (ELAVIL) 10 MG tablet Take 1 tablet (10 mg total) by mouth at bedtime as needed. (Patient taking differently: Take 5 mg by mouth at bedtime. ) 90 tablet 1  . Benzocaine-Menthol (CHLORASEPTIC MAX SORE THROAT) 15-10 MG LOZG Use as directed 1 tablet in the mouth or throat as needed. 30 lozenge 0  . bisoprolol-hydrochlorothiazide (ZIAC) 2.5-6.25 MG tablet Take 1 tablet by mouth daily. 90 tablet 3  . Blood Glucose Monitoring Suppl (ONE TOUCH ULTRA 2) w/Device KIT Use to check blood sugars twice a day Dx E11.9 1 each 0  . calcium carbonate (OS-CAL) 600 MG tablet Take 600 mg by mouth daily.    [name] dextromethorphan-guaiFENesin (MUCINEX DM) 30-600 MG 12hr  tablet Take 1 tablet by mouth 2 (two) times daily as needed for cough. 14 tablet 0  . gabapentin (NEURONTIN) 100 MG capsule Take 200 mg by mouth 3 (three) times daily.     [name] ipratropium (ATROVENT) 0.03 % nasal spray Place 2 sprays into both nostrils 2 (two) times daily. Do not use for more than 5days. 30 mL 0  . lovastatin (MEVACOR) 20 MG tablet Take 1 tablet (20 mg total) by mouth every other day. 90 tablet 1  . metFORMIN (GLUCOPHAGE) 500 MG tablet Take 1 tablet (500 mg total) by mouth daily with breakfast. 90 tablet 3  . ONETOUCH DELICA LANCETS FINE MISC Use one lancet to check blood sugar twice daily 100 each 3  . ramipril (ALTACE) 2.5 MG capsule Take 1 capsule (2.5 mg total) by mouth daily. 90 capsule 2  . [START ON 03/26/2016] amoxicillin-clavulanate (AUGMENTIN) 875-125 MG tablet Take 1 tablet by mouth 2 (two) times daily. (Patient not taking: Reported on 03/24/2016) 14 tablet 0  . glucose blood (ONE TOUCH ULTRA TEST) test strip 1 each by Other route 2 (two) times daily. Use to check blood sugars twice a day Dx E11.9 (Patient not taking: Reported on 03/24/2016) 100 each 3  . ibuprofen (ADVIL,MOTRIN) 200 MG tablet Take 200 mg by mouth every 6 (six) hours as needed for fever, headache or mild pain.     [name]  meclizine (ANTIVERT) 25 MG tablet Take 25 mg by mouth 3 (three) times daily as needed for dizziness.     No current facility-administered medications for this encounter.     Physical Findings:  height is '5\' 1"'  (1.549 m) and weight is 123 lb (55.8 kg). Her oral temperature is 98.7 F (37.1 C). Her blood pressure is 123/68 and her pulse is 88. Her respiration is 18.  In general this is a well appearing caucasian female in no acute distress. She's alert and oriented x4 and appropriate throughout the examination. Cardiac evaluation reveals RRR, no C/R/M. Chest is clear to auscultation bilaterally and there is no palpable adenopathy in the cervical or supraclavicular region.  Lab Findings: Lab  Results  Component Value Date   WBC 1.7 (L) 02/17/2016   HGB 10.6 (L) 02/17/2016   HCT 30.5 (L) 02/17/2016   MCV 82.0 02/17/2016   PLT 148 02/17/2016     Radiographic Findings: No results found.  Impression/Plan: 1. Stage IIA, T1a, N1, M0 NSCLC, adenocarcinoma of the right lower lobe. The patient will see Dr. Julien Nordmann next week and plans to proceed with CT on 03/26/16. We will plan to see her back as needed moving forward and she will call with questions or concerns regarding her previous treatment. She was also given precautions for radiation pneumonitis and will keep [place] informed of her status. 2. Stage IB, T2a, N0, M0 NSCLC, adenocarcionoma of the right upper lobe. This will be followed as well in surveillance as above along with her more recent diagnosis. 3. Upper respiratory infection. The patient will proceed with antibiotic therapy today and continue to follow up with her PCP. She was scheduled for retina surgery tomorrow, and will contact her ophthalmologist to determine if she should still proceed.    Carola Rhine, PAC

## 2016-03-24 NOTE — Addendum Note (Signed)
Encounter addended by: Hayden Pedro, PA-C on: 03/24/2016 11:05 AM<BR>    Actions taken: Sign clinical note

## 2016-03-25 ENCOUNTER — Telehealth: Payer: Self-pay | Admitting: Internal Medicine

## 2016-03-25 ENCOUNTER — Ambulatory Visit: Payer: Medicare Other | Admitting: Radiation Oncology

## 2016-03-25 ENCOUNTER — Other Ambulatory Visit (INDEPENDENT_AMBULATORY_CARE_PROVIDER_SITE_OTHER): Payer: Medicare Other | Admitting: Ophthalmology

## 2016-03-25 DIAGNOSIS — E113591 Type 2 diabetes mellitus with proliferative diabetic retinopathy without macular edema, right eye: Secondary | ICD-10-CM | POA: Diagnosis not present

## 2016-03-25 DIAGNOSIS — E11311 Type 2 diabetes mellitus with unspecified diabetic retinopathy with macular edema: Secondary | ICD-10-CM

## 2016-03-26 ENCOUNTER — Other Ambulatory Visit (HOSPITAL_BASED_OUTPATIENT_CLINIC_OR_DEPARTMENT_OTHER): Payer: Medicare Other

## 2016-03-26 ENCOUNTER — Encounter (HOSPITAL_COMMUNITY): Payer: Self-pay

## 2016-03-26 ENCOUNTER — Ambulatory Visit (HOSPITAL_COMMUNITY)
Admission: RE | Admit: 2016-03-26 | Discharge: 2016-03-26 | Disposition: A | Discharge status: Home / Self Care | Payer: Medicare Other | Source: Ambulatory Visit | Attending: Internal Medicine | Admitting: Internal Medicine

## 2016-03-26 DIAGNOSIS — E119 Type 2 diabetes mellitus without complications: Secondary | ICD-10-CM | POA: Diagnosis not present

## 2016-03-26 DIAGNOSIS — E042 Nontoxic multinodular goiter: Secondary | ICD-10-CM | POA: Insufficient documentation

## 2016-03-26 DIAGNOSIS — C3491 Malignant neoplasm of unspecified part of right bronchus or lung: Secondary | ICD-10-CM | POA: Diagnosis not present

## 2016-03-26 DIAGNOSIS — Z5111 Encounter for antineoplastic chemotherapy: Secondary | ICD-10-CM

## 2016-03-26 DIAGNOSIS — C3431 Malignant neoplasm of lower lobe, right bronchus or lung: Secondary | ICD-10-CM

## 2016-03-26 DIAGNOSIS — J439 Emphysema, unspecified: Secondary | ICD-10-CM | POA: Insufficient documentation

## 2016-03-26 LAB — CBC WITH DIFFERENTIAL/PLATELET
BASO%: 0.3 % (ref 0.0–2.0)
Basophils Absolute: 0 10*3/uL (ref 0.0–0.1)
EOS ABS: 0 10*3/uL (ref 0.0–0.5)
EOS%: 1.1 % (ref 0.0–7.0)
HCT: 31.9 % — ABNORMAL LOW (ref 34.8–46.6)
HGB: 10.8 g/dL — ABNORMAL LOW (ref 11.6–15.9)
LYMPH%: 7.6 % — AB (ref 14.0–49.7)
MCH: 30.1 pg (ref 25.1–34.0)
MCHC: 33.9 g/dL (ref 31.5–36.0)
MCV: 88.9 fL (ref 79.5–101.0)
MONO#: 0.5 10*3/uL (ref 0.1–0.9)
MONO%: 14.1 % — AB (ref 0.0–14.0)
NEUT%: 76.9 % — AB (ref 38.4–76.8)
NEUTROS ABS: 2.8 10*3/uL (ref 1.5–6.5)
PLATELETS: 186 10*3/uL (ref 145–400)
RBC: 3.59 10*6/uL — AB (ref 3.70–5.45)
RDW: 15.7 % — ABNORMAL HIGH (ref 11.2–14.5)
WBC: 3.7 10*3/uL — AB (ref 3.9–10.3)
lymph#: 0.3 10*3/uL — ABNORMAL LOW (ref 0.9–3.3)

## 2016-03-26 LAB — COMPREHENSIVE METABOLIC PANEL
ALK PHOS: 69 U/L (ref 40–150)
ALT: 10 U/L (ref 0–55)
ANION GAP: 9 meq/L (ref 3–11)
AST: 14 U/L (ref 5–34)
Albumin: 3.2 g/dL — ABNORMAL LOW (ref 3.5–5.0)
BILIRUBIN TOTAL: 0.4 mg/dL (ref 0.20–1.20)
BUN: 16.8 mg/dL (ref 7.0–26.0)
CHLORIDE: 102 meq/L (ref 98–109)
CO2: 26 mEq/L (ref 22–29)
CREATININE: 0.7 mg/dL (ref 0.6–1.1)
Calcium: 10 mg/dL (ref 8.4–10.4)
EGFR: 80 mL/min/{1.73_m2} — AB (ref >90)
Glucose: 124 mg/dl (ref 70–140)
POTASSIUM: 3.8 meq/L (ref 3.5–5.1)
SODIUM: 137 meq/L (ref 136–145)
Total Protein: 7 g/dL (ref 6.4–8.3)

## 2016-03-26 LAB — HEMOGLOBIN A1C: HEMOGLOBIN A1C: 6.1

## 2016-03-26 MED ORDER — IOPAMIDOL (ISOVUE-300) INJECTION 61%
75.0000 mL | Freq: Once | INTRAVENOUS | Status: AC | PRN
  Administered 2016-03-26: 75 mL via INTRAVENOUS

## 2016-03-27 ENCOUNTER — Telehealth: Payer: Self-pay | Admitting: Internal Medicine

## 2016-03-27 NOTE — Telephone Encounter (Signed)
Patient is asking for you to give her a call about her metformin. She would not provide me with any other details.

## 2016-03-27 NOTE — Telephone Encounter (Signed)
Increase to 2 metformin daily.  We can wait and do the a1c at her next blood draw

## 2016-03-27 NOTE — Telephone Encounter (Signed)
Pt states Hbg A1c was drawn yesterday at Oncology. Called oncology and the lab was not drawn and the blood has already been thrown away. Does the pt need to come back in for the blood work?   States she is now taking 1 metformin daily, her sugars are running from 134-192. She thinks that she needs to go back to 2 pills a day.

## 2016-03-30 ENCOUNTER — Telehealth: Payer: Self-pay | Admitting: Internal Medicine

## 2016-03-30 ENCOUNTER — Encounter: Payer: Self-pay | Admitting: Internal Medicine

## 2016-03-30 ENCOUNTER — Ambulatory Visit (HOSPITAL_BASED_OUTPATIENT_CLINIC_OR_DEPARTMENT_OTHER): Payer: Medicare Other | Admitting: Internal Medicine

## 2016-03-30 VITALS — BP 134/65 | HR 88 | Temp 98.4°F | Resp 18 | Ht 61.0 in | Wt 123.8 lb

## 2016-03-30 DIAGNOSIS — Z5111 Encounter for antineoplastic chemotherapy: Secondary | ICD-10-CM

## 2016-03-30 DIAGNOSIS — R634 Abnormal weight loss: Secondary | ICD-10-CM | POA: Diagnosis not present

## 2016-03-30 DIAGNOSIS — E119 Type 2 diabetes mellitus without complications: Secondary | ICD-10-CM

## 2016-03-30 DIAGNOSIS — R05 Cough: Secondary | ICD-10-CM

## 2016-03-30 DIAGNOSIS — Z85118 Personal history of other malignant neoplasm of bronchus and lung: Secondary | ICD-10-CM | POA: Diagnosis not present

## 2016-03-30 DIAGNOSIS — E46 Unspecified protein-calorie malnutrition: Secondary | ICD-10-CM

## 2016-03-30 DIAGNOSIS — C3431 Malignant neoplasm of lower lobe, right bronchus or lung: Secondary | ICD-10-CM

## 2016-03-30 DIAGNOSIS — R059 Cough, unspecified: Secondary | ICD-10-CM

## 2016-03-30 MED ORDER — HYDROCODONE-HOMATROPINE 5-1.5 MG/5ML PO SYRP: 5.0000 mL | ORAL_SOLUTION | Freq: Four times a day (QID) | ORAL | Status: DC | PRN

## 2016-03-30 MED ORDER — HYDROCODONE-HOMATROPINE 5-1.5 MG/5ML PO SYRP: 5.0000 mL | ORAL_SOLUTION | Freq: Four times a day (QID) | ORAL | 0 refills | Status: DC | PRN

## 2016-03-30 MED ORDER — METHYLPREDNISOLONE 4 MG PO TBPK: ORAL_TABLET | ORAL | 0 refills | Status: DC

## 2016-03-30 NOTE — Telephone Encounter (Signed)
Called pt no answer x's 10 rings. Will try again later...Samantha Quinn

## 2016-03-30 NOTE — Progress Notes (Signed)
[facility] [phone]   [phone]  OFFICE PROGRESS NOTE  Binnie Rail, MD [place] [address]  DIAGNOSIS: Recurrent non-small cell lung cancer presented with unresectable a stage II a (T1a, N1, M0) non-small cell lung cancer, adenocarcinoma presented with right perihilar lower lobe soft tissue mass with right hilar adenopathy diagnosed in August 2017. The patient has a history of stage Ib non-small cell lung cancer diagnosed in March 2013.  PRIOR THERAPY: 1) status post right upper lobectomy with lymph node dissection in March 2013 and the tumor was negative for EGFR and ALK mutations. 2) concurrent chemoradiation with weekly carboplatin for AUC of 2 and paclitaxel 45 MG/M2, status post 6 cycles last dose was given 02/10/2016 with partial response.  CURRENT THERAPY: Observation.  INTERVAL HISTORY: Samantha Quinn 82 y.o. female returns to the clinic today for follow-up visit accompanied by her son, daughter and daughter-in-law. The patient completed a course of concurrent chemoradiation with weekly carboplatin and paclitaxel in October 2017. She has been on observation for the last few weeks. She continues to complain of increasing fatigue and weakness as well as dry cough. She also has shortness of breath with exertion but no hemoptysis. She denied having any chest pain. She lost 4 more pounds since her last visit. She denied having any fever or chills. She has no nausea or vomiting. She had repeat CT scan of the chest performed recently and she is here for evaluation and discussion of her scan results.  MEDICAL HISTORY: Past Medical History:  Diagnosis Date  . Back pain    bulding disc  . Bronchitis    hx of-many,many years   . Diabetes mellitus   . Diabetes mellitus    type 2 and takes Metformin bid  . Dyslipidemia   . Embolism - blood clot    hx of in left lower leg and was on Coumadin for about 8month;this was about  8-159yrago  . Family history of adverse reaction to anesthesia    Daughter- Nausea  . History of colonic polyps   . HOH (hard of hearing)   . Hx of migraines    as a teenager  . Hypercholesterolemia   . Hyperlipidemia    takes Lovastatin every other day  . Hypertension    takes Ziac daily and Ramipril as well  . Hypothyroidism    takes Synthroid every other day  . Insomnia    takes Elavil nightly  . Joint pain   . Lung mass   . Myalgia   . Osteoarthritis   . Osteoporosis   . Pneumothorax of right lung after biopsy 07/09/11   HAD CHEST TUBE PLACEMENT  . PONV (postoperative nausea and vomiting)   . Primary adenocarcinoma of lower lobe of right lung ([facility]/2013   pT2a, pN0 M0 Stage 1 Well differenced Adenocarcinoma 3.5 cm resected 07/17/2011  . Primary adenocarcinoma of lung ([facility]/27/2013  . Shortness of breath dyspnea    With exertion  . Skin cancer    legs have dark spots on them  . Upper respiratory infection 04/2011  . Urinary incontinence    takes Detrol daily prn    ALLERGIES:  is allergic to codeine; tramadol; and demerol.  MEDICATIONS:  Current Outpatient Prescriptions  Medication Sig Dispense Refill  . amitriptyline (ELAVIL) 10 MG tablet Take 1 tablet (10 mg total) by mouth at bedtime as needed. (Patient taking differently: Take 5 mg by mouth  at bedtime. ) 90 tablet 1  . amoxicillin-clavulanate (AUGMENTIN) 875-125 MG tablet Take 1 tablet by mouth 2 (two) times daily. (Patient not taking: Reported on 03/24/2016) 14 tablet 0  . Benzocaine-Menthol (CHLORASEPTIC MAX SORE THROAT) 15-10 MG LOZG Use as directed 1 tablet in the mouth or throat as needed. 30 lozenge 0  . bisoprolol-hydrochlorothiazide (ZIAC) 2.5-6.25 MG tablet Take 1 tablet by mouth daily. 90 tablet 3  . Blood Glucose Monitoring Suppl (ONE TOUCH ULTRA 2) w/Device KIT Use to check blood sugars twice a day Dx E11.9 1 each 0  . calcium carbonate (OS-CAL) 600 MG tablet Take 600 mg by mouth daily.    [name]  dextromethorphan-guaiFENesin (MUCINEX DM) 30-600 MG 12hr tablet Take 1 tablet by mouth 2 (two) times daily as needed for cough. 14 tablet 0  . gabapentin (NEURONTIN) 100 MG capsule Take 200 mg by mouth 3 (three) times daily.     [name] glucose blood (ONE TOUCH ULTRA TEST) test strip 1 each by Other route 2 (two) times daily. Use to check blood sugars twice a day Dx E11.9 (Patient not taking: Reported on 03/24/2016) 100 each 3  . ibuprofen (ADVIL,MOTRIN) 200 MG tablet Take 200 mg by mouth every 6 (six) hours as needed for fever, headache or mild pain.     [name] ipratropium (ATROVENT) 0.03 % nasal spray Place 2 sprays into both nostrils 2 (two) times daily. Do not use for more than 5days. 30 mL 0  . lovastatin (MEVACOR) 20 MG tablet Take 1 tablet (20 mg total) by mouth every other day. 90 tablet 1  . meclizine (ANTIVERT) 25 MG tablet Take 25 mg by mouth 3 (three) times daily as needed for dizziness.    . metFORMIN (GLUCOPHAGE) 500 MG tablet Take 1 tablet (500 mg total) by mouth daily with breakfast. 90 tablet 3  . ONETOUCH DELICA LANCETS FINE MISC Use one lancet to check blood sugar twice daily 100 each 3  . ramipril (ALTACE) 2.5 MG capsule Take 1 capsule (2.5 mg total) by mouth daily. 90 capsule 2   No current facility-administered medications for this visit.     SURGICAL HISTORY:  Past Surgical History:  Procedure Laterality Date  . ABDOMINAL HYSTERECTOMY    . APPENDECTOMY     as a child  . CHEST TUBE REMOVAL  07/13/11   RIGHT  CHEST TUBE  . CHOLECYSTECTOMY    . COLONOSCOPY    . COLONOSCOPY    . DILATION AND CURETTAGE OF UTERUS     x 2  . EYE SURGERY Bilateral    Cataract  . HEMORRHOID SURGERY    . LUNG BIOPSY  07/09/11   RIGHT UPER LOBE LUNG MASS   . RIGHT CHEST TUBE PLACEMENT  07/10/11   S/P LUNG BX  . ROTATOR CUFF REPAIR     bilateral  . TOE SURGERY  2013   right small toe  . TONSILLECTOMY     as a child  . VIDEO BRONCHOSCOPY  07/17/2011   Procedure: VIDEO BRONCHOSCOPY;  Surgeon:  Grace Isaac, MD;  Location: [facility];  Service: Thoracic;  Laterality: N/A;  . VIDEO BRONCHOSCOPY WITH ENDOBRONCHIAL ULTRASOUND N/A 12/10/2015   Procedure: VIDEO BRONCHOSCOPY WITH ENDOBRONCHIAL ULTRASOUND with biopsies.;  Surgeon: Grace Isaac, MD;  Location: [facility];  Service: Thoracic;  Laterality: N/A;    REVIEW OF SYSTEMS:  Constitutional: positive for anorexia, fatigue and weight loss Eyes: negative for icterus, irritation and redness Ears, nose, mouth, throat, and face: negative for hoarseness,  sore mouth and sore throat Respiratory: positive for cough and dyspnea on exertion Cardiovascular: negative for orthopnea and palpitations Gastrointestinal: negative for constipation, diarrhea, nausea and vomiting Genitourinary:negative for dysuria, frequency and hematuria Integument/breast: negative for dryness, pruritus and rash Hematologic/lymphatic: negative for bleeding and easy bruising Musculoskeletal:negative Neurological: negative Behavioral/Psych: negative Endocrine: negative Allergic/Immunologic: negative   PHYSICAL EXAMINATION: General appearance: alert, cooperative, fatigued and no distress Head: Normocephalic, without obvious abnormality, atraumatic Neck: no adenopathy, no JVD, supple, symmetrical, trachea midline and thyroid not enlarged, symmetric, no tenderness/mass/nodules Lymph nodes: Cervical, supraclavicular, and axillary nodes normal. Resp: clear to auscultation bilaterally Back: symmetric, no curvature. ROM normal. No CVA tenderness. Cardio: regular rate and rhythm, S1, S2 normal, no murmur, click, rub or gallop GI: soft, non-tender; bowel sounds normal; no masses,  no organomegaly Extremities: extremities normal, atraumatic, no cyanosis or edema Neurologic: Alert and oriented X 3, normal strength and tone. Normal symmetric reflexes. Normal coordination and gait  ECOG PERFORMANCE STATUS: 1 - Symptomatic but completely ambulatory  Blood pressure 134/65, pulse  88, temperature 98.4 F (36.9 C), temperature source Oral, resp. rate 18, height '5\' 1"'  (1.549 m), weight 123 lb 12.8 oz (56.2 kg), SpO2 98 %.  LABORATORY DATA: Lab Results  Component Value Date   WBC 3.7 (L) 03/26/2016   HGB 10.8 (L) 03/26/2016   HCT 31.9 (L) 03/26/2016   MCV 88.9 03/26/2016   PLT 186 03/26/2016      Chemistry      Component Value Date/Time   NA 137 03/26/2016 1034   K 3.8 03/26/2016 1034   CL 103 12/10/2015 0758   CO2 26 03/26/2016 1034   BUN 16.8 03/26/2016 1034   CREATININE 0.7 03/26/2016 1034      Component Value Date/Time   CALCIUM 10.0 03/26/2016 1034   ALKPHOS 69 03/26/2016 1034   AST 14 03/26/2016 1034   ALT 10 03/26/2016 1034   BILITOT 0.40 03/26/2016 1034       RADIOGRAPHIC STUDIES: Ct Chest W Contrast  Result Date: 03/26/2016 CLINICAL DATA:  Restaging right lung cancer. Initial diagnosis 2013. Recurrent lesions in August 2017 EXAM: CT CHEST WITH CONTRAST TECHNIQUE: Multidetector CT imaging of the chest was performed during intravenous contrast administration. CONTRAST:  41m ISOVUE-300 IOPAMIDOL (ISOVUE-300) INJECTION 61% COMPARISON:  PET-CT 12/06/2015 and chest CT 11/28/2015. FINDINGS: Chest wall: No breast masses, supraclavicular or axillary lymphadenopathy. Stable right-sided multinodular thyroid goiter. Cardiovascular: The heart is normal in size. No pericardial effusion. Stable tortuosity, ectasia and calcification of the thoracic aorta. No dissection. The branch vessels are patent. Mediastinum/Nodes: No mediastinal or hilar mass or lymphadenopathy. The esophagus is grossly normal. Lungs/Pleura: Nodular soft tissue density in the right upper lung on image number 46 measures 25 x 14.5 mm and previously measured 29 x 20 mm. Stable nodular lesion in the right lower lobe on image number 79. This measures 20 x 15.5 mm and is unchanged. This was not metabolically active on the prior PET-CT and is likely scarring change. Stable underline emphysematous  changes and pulmonary scarring. No new pulmonary nodules to suggest pulmonary metastatic disease. No pleural effusion. Upper Abdomen: The upper abdomen is unremarkable and stable. No findings for hepatic or adrenal gland metastasis. Stable large rim calcified splenic cyst. Status post cholecystectomy with stable biliary dilatation. Musculoskeletal: No significant bony findings. IMPRESSION: 1. Interval decrease in size of the right upper lobe pulmonary lesion. \ 2. The right lower lobe pulmonary lesion is stable. 3. No new pulmonary lesions to suggest pulmonary metastatic disease. 4. Stable  underline emphysematous changes. 5. No mediastinal or hilar mass or adenopathy. 6. Stable multinodular thyroid goiter. 7. No findings for upper abdominal metastatic disease. Electronically Signed   By: Marijo Sanes M.D.   On: 03/26/2016 16:27    ASSESSMENT AND PLAN: This is a very pleasant 82 years old white female with:  1) recurrent non-small cell lung cancer presented with unresectable a stage IIA adenocarcinoma with right perihilar lower lobe nodule in addition to right hilar adenopathy. The patient underwent a course of concurrent chemoradiation with weekly carboplatin and paclitaxel. She tolerated her treatment well except for fatigue and odynophagia. Her recent CT scan of the chest showed mild improvement in her disease. I personally and independently reviewed the scan and showed the images to the patient and her family. I discussed with the patient treatment option at this point including observation versus consideration of consolidation chemotherapy with carboplatin and paclitaxel. Based on her performance status I felt that consolidation chemotherapy may not be the right option for the patient. The patient and her family are in agreement with the current plan. I recommended for her to continue on observation with repeat CT scan of the chest in 3 months. 2) Dry cough: This is likely secondary to radiation  induced pneumonitis. I will start the patient on Hycodan 5 ML by mouth every 6 hours as needed for cough. 3) lack of appetite and weight loss: I would start the patient on Medrol Dosepak. This may also help with her radiation induced pneumonitis and improve her cough. The patient was advised to call immediately if she has any concerning symptoms in the interval.  The patient voices understanding of current disease status and treatment options and is in agreement with the current care plan.  All questions were answered. The patient knows to call the clinic with any problems, questions or concerns. We can certainly see the patient much sooner if necessary.   Disclaimer: This note was dictated with voice recognition software. Similar sounding words can inadvertently be transcribed and may not be corrected upon review.

## 2016-03-30 NOTE — Telephone Encounter (Signed)
Appointments scheduled per 12/4 LOS. Patient given AVS report and calendars with future scheduled appointments.  Patient aware of CT scan. Patient will return to pick up contrast closer to date for CT scan appointment.

## 2016-03-30 NOTE — Telephone Encounter (Signed)
Appointments scheduled per 12/4 LOS. Patient given AVS report and calendars with future scheduled appointments.

## 2016-03-31 ENCOUNTER — Encounter: Payer: Self-pay | Admitting: Internal Medicine

## 2016-03-31 NOTE — Telephone Encounter (Signed)
Called pt no answer LMOM RTC.../lmb 

## 2016-04-02 DIAGNOSIS — Z85828 Personal history of other malignant neoplasm of skin: Secondary | ICD-10-CM | POA: Diagnosis not present

## 2016-04-02 DIAGNOSIS — B351 Tinea unguium: Secondary | ICD-10-CM | POA: Diagnosis not present

## 2016-04-02 DIAGNOSIS — L821 Other seborrheic keratosis: Secondary | ICD-10-CM | POA: Diagnosis not present

## 2016-04-02 DIAGNOSIS — L570 Actinic keratosis: Secondary | ICD-10-CM | POA: Diagnosis not present

## 2016-04-02 DIAGNOSIS — L565 Disseminated superficial actinic porokeratosis (DSAP): Secondary | ICD-10-CM | POA: Diagnosis not present

## 2016-04-03 ENCOUNTER — Other Ambulatory Visit: Payer: Self-pay

## 2016-05-05 ENCOUNTER — Ambulatory Visit (INDEPENDENT_AMBULATORY_CARE_PROVIDER_SITE_OTHER)
Admission: RE | Admit: 2016-05-05 | Discharge: 2016-05-05 | Disposition: A | Discharge status: Home / Self Care | Payer: Medicare Other | Source: Ambulatory Visit | Attending: Internal Medicine | Admitting: Internal Medicine

## 2016-05-05 ENCOUNTER — Ambulatory Visit (INDEPENDENT_AMBULATORY_CARE_PROVIDER_SITE_OTHER): Payer: Medicare Other | Admitting: Internal Medicine

## 2016-05-05 ENCOUNTER — Other Ambulatory Visit (INDEPENDENT_AMBULATORY_CARE_PROVIDER_SITE_OTHER): Payer: Medicare Other

## 2016-05-05 ENCOUNTER — Encounter: Payer: Self-pay | Admitting: Internal Medicine

## 2016-05-05 VITALS — BP 122/82 | HR 83 | Temp 98.6°F | Resp 18 | Wt 124.0 lb

## 2016-05-05 DIAGNOSIS — C3490 Malignant neoplasm of unspecified part of unspecified bronchus or lung: Secondary | ICD-10-CM

## 2016-05-05 DIAGNOSIS — R059 Cough, unspecified: Secondary | ICD-10-CM

## 2016-05-05 DIAGNOSIS — E042 Nontoxic multinodular goiter: Secondary | ICD-10-CM | POA: Diagnosis not present

## 2016-05-05 DIAGNOSIS — R5383 Other fatigue: Secondary | ICD-10-CM

## 2016-05-05 DIAGNOSIS — R05 Cough: Secondary | ICD-10-CM | POA: Diagnosis not present

## 2016-05-05 DIAGNOSIS — E119 Type 2 diabetes mellitus without complications: Secondary | ICD-10-CM

## 2016-05-05 LAB — CBC WITH DIFFERENTIAL/PLATELET
Basophils Absolute: 0 10*3/uL (ref 0.0–0.1)
Basophils Relative: 0.1 % (ref 0.0–3.0)
EOS PCT: 1.2 % (ref 0.0–5.0)
Eosinophils Absolute: 0.1 10*3/uL (ref 0.0–0.7)
HEMATOCRIT: 32.3 % — AB (ref 36.0–46.0)
HEMOGLOBIN: 11 g/dL — AB (ref 12.0–15.0)
Lymphs Abs: 0.6 10*3/uL — ABNORMAL LOW (ref 0.7–4.0)
MCHC: 34.1 g/dL (ref 30.0–36.0)
MCV: 86 fl (ref 78.0–100.0)
Monocytes Absolute: 0.7 10*3/uL (ref 0.1–1.0)
Monocytes Relative: 8.5 % (ref 3.0–12.0)
Neutro Abs: 6.7 10*3/uL (ref 1.4–7.7)
Neutrophils Relative %: 83 % — ABNORMAL HIGH (ref 43.0–77.0)
Platelets: 301 10*3/uL (ref 150.0–400.0)
RBC: 3.75 Mil/uL — ABNORMAL LOW (ref 3.87–5.11)
RDW: 14.5 % (ref 11.5–15.5)
WBC: 8 10*3/uL (ref 4.0–10.5)

## 2016-05-05 LAB — COMPREHENSIVE METABOLIC PANEL
ALBUMIN: 3.8 g/dL (ref 3.5–5.2)
ALT: 7 U/L (ref 0–35)
AST: 11 U/L (ref 0–37)
Alkaline Phosphatase: 69 U/L (ref 39–117)
BUN: 15 mg/dL (ref 6–23)
CHLORIDE: 98 meq/L (ref 96–112)
CO2: 30 mEq/L (ref 19–32)
Calcium: 10 mg/dL (ref 8.4–10.5)
Creatinine, Ser: 0.59 mg/dL (ref 0.40–1.20)
GFR: 102.65 mL/min (ref >60.00)
Glucose, Bld: 112 mg/dL — ABNORMAL HIGH (ref 70–99)
POTASSIUM: 3.7 meq/L (ref 3.5–5.1)
SODIUM: 138 meq/L (ref 135–145)
Total Bilirubin: 0.3 mg/dL (ref 0.2–1.2)
Total Protein: 7.4 g/dL (ref 6.0–8.3)

## 2016-05-05 MED ORDER — AZITHROMYCIN 250 MG PO TABS: ORAL_TABLET | ORAL | 0 refills | Status: DC

## 2016-05-05 MED ORDER — PREDNISONE 10 MG PO TABS: ORAL_TABLET | ORAL | 0 refills | Status: DC

## 2016-05-05 MED ORDER — METFORMIN HCL 500 MG PO TABS: 500.0000 mg | ORAL_TABLET | Freq: Two times a day (BID) | ORAL | 3 refills | Status: DC

## 2016-05-05 NOTE — Progress Notes (Signed)
Pre visit review using our clinic review tool, if applicable. No additional management support is needed unless otherwise documented below in the visit note. 

## 2016-05-05 NOTE — Progress Notes (Signed)
Subjective:    Patient ID: Samantha Quinn, female    DOB: November 12, 1927, 83 y.o.   MRN: [id number]  HPI She is here for an acute visit for cold symptoms.   Her symptoms started the middle to end of November.  She was seen in our office and prescribed Augmentin, mucinex-DM, Atrovent nasal spray and chloraseptic spray.  She had some improvement, but was still symptomatic when she saw Dr Julien Nordmann on 03/30/16.  She did have a CT scan of her lungs to evaluate the effect of her treatment.  When she saw Dr Julien Nordmann she had a dry cough, fatigue, weakness, mild weight loss, SOB.  He felt the dry cough, lack of appetite and weight loss was secondary to radiation induced pneumonitis.  He prescribed medrol dose pak and hycodan. Her symptoms have gotten a little better, but the cough as lingered.    The past couple of days she has had an elevated temp 99.7 - 100.6.  She still has decreased appetite, fatigue, cough that is occasionally productive and right upper chest pain with coughing and deep breaths.  She denies back pain, SOB, wheeze, nasal congestion, ear pain, sore throat, sinus pain, abdominal pain and nausea.       Medications and allergies reviewed with patient and updated if appropriate.  Patient Active Problem List   Diagnosis Date Noted  . Macular degeneration 03/09/2016  . Encounter for antineoplastic chemotherapy 12/19/2015  . Lung mass 12/13/2015  . Meralgia paresthetica of left side 07/10/2015  . Osteoporosis 07/10/2015  . Onychomycosis 07/10/2015  . Anxiety 07/10/2015  . Diabetes ([place]) 07/10/2015  . Cough 12/12/2013  . Multinodular goiter (nontoxic) 09/29/2012  . Pneumothorax after biopsy 07/09/2011    Class: Acute  . Primary adenocarcinoma of lower lobe of right lung ([place]) 06/24/2011  . Hypercholesterolemia 10/20/2010  . Essential hypertension, benign 10/20/2010  . Hypothyroidism (acquired) 10/20/2010  . Osteoarthritis 10/20/2010    Current Outpatient Prescriptions on File  Prior to Visit  Medication Sig Dispense Refill  . amitriptyline (ELAVIL) 10 MG tablet Take 1 tablet (10 mg total) by mouth at bedtime as needed. (Patient taking differently: Take 5 mg by mouth at bedtime. ) 90 tablet 1  . bisoprolol-hydrochlorothiazide (ZIAC) 2.5-6.25 MG tablet Take 1 tablet by mouth daily. 90 tablet 3  . Blood Glucose Monitoring Suppl (ONE TOUCH ULTRA 2) w/Device KIT Use to check blood sugars twice a day Dx E11.9 1 each 0  . calcium carbonate (OS-CAL) 600 MG tablet Take 600 mg by mouth daily.    [name] gabapentin (NEURONTIN) 100 MG capsule Take 200 mg by mouth 3 (three) times daily.     [name] glucose blood (ONE TOUCH ULTRA TEST) test strip 1 each by Other route 2 (two) times daily. Use to check blood sugars twice a day Dx E11.9 100 each 3  . ibuprofen (ADVIL,MOTRIN) 200 MG tablet Take 200 mg by mouth every 6 (six) hours as needed for fever, headache or mild pain.     [name] lovastatin (MEVACOR) 20 MG tablet Take 1 tablet (20 mg total) by mouth every other day. 90 tablet 1  . metFORMIN (GLUCOPHAGE) 500 MG tablet Take 1 tablet (500 mg total) by mouth daily with breakfast. 90 tablet 3  . ONETOUCH DELICA LANCETS FINE MISC Use one lancet to check blood sugar twice daily 100 each 3  . ramipril (ALTACE) 2.5 MG capsule Take 1 capsule (2.5 mg total) by mouth daily. 90 capsule 2   No current facility-administered medications  on file prior to visit.     Past Medical History:  Diagnosis Date  . Back pain    bulding disc  . Bronchitis    hx of-many,many years   . Diabetes mellitus   . Diabetes mellitus    type 2 and takes Metformin bid  . Dyslipidemia   . Embolism - blood clot    hx of in left lower leg and was on Coumadin for about 19month;this was about 8-135yrago  . Family history of adverse reaction to anesthesia    Daughter- Nausea  . History of colonic polyps   . HOH (hard of hearing)   . Hx of migraines    as a teenager  . Hypercholesterolemia   . Hyperlipidemia    takes  Lovastatin every other day  . Hypertension    takes Ziac daily and Ramipril as well  . Hypothyroidism    takes Synthroid every other day  . Insomnia    takes Elavil nightly  . Joint pain   . Lung mass   . Myalgia   . Osteoarthritis   . Osteoporosis   . Pneumothorax of right lung after biopsy 07/09/11   HAD CHEST TUBE PLACEMENT  . PONV (postoperative nausea and vomiting)   . Primary adenocarcinoma of lower lobe of right lung ([facility]/27/2013   pT2a, pN0 M0 Stage 1 Well differenced Adenocarcinoma 3.5 cm resected 07/17/2011  . Primary adenocarcinoma of lung ([facility]/27/2013  . Shortness of breath dyspnea    With exertion  . Skin cancer    legs have dark spots on them  . Upper respiratory infection 04/2011  . Urinary incontinence    takes Detrol daily prn    Past Surgical History:  Procedure Laterality Date  . ABDOMINAL HYSTERECTOMY    . APPENDECTOMY     as a child  . CHEST TUBE REMOVAL  07/13/11   RIGHT  CHEST TUBE  . CHOLECYSTECTOMY    . COLONOSCOPY    . COLONOSCOPY    . DILATION AND CURETTAGE OF UTERUS     x 2  . EYE SURGERY Bilateral    Cataract  . HEMORRHOID SURGERY    . LUNG BIOPSY  07/09/11   RIGHT UPER LOBE LUNG MASS   . RIGHT CHEST TUBE PLACEMENT  07/10/11   S/P LUNG BX  . ROTATOR CUFF REPAIR     bilateral  . TOE SURGERY  2013   right small toe  . TONSILLECTOMY     as a child  . VIDEO BRONCHOSCOPY  07/17/2011   Procedure: VIDEO BRONCHOSCOPY;  Surgeon: EdGrace IsaacMD;  [facility] Service[facility];  Laterality: N/A;  . VIDEO BRONCHOSCOPY WITH ENDOBRONCHIAL ULTRASOUND N/A 12/10/2015   Procedure: VIDEO BRONCHOSCOPY WITH ENDOBRONCHIAL ULTRASOUND with biopsies.;  Surgeon: EdGrace IsaacMD;  [facility] Service: Thor[facility];  Laterality: N/A;    Social History   Social History  . Marital status: Widowed    Spouse name: N/A  . Number of children: N/A  . Years of education: N/A   Social History Main Topics  . Smoking status: Never Smoker  .  Smokeless tobacco: Never Used  . Alcohol use No  . Drug use: No  . Sexual activity: Yes    Birth control/ protection: Surgical   Other Topics Concern  . Not on file   Social History Narrative  . No narrative on file    Family History  Problem Relation Age of Onset  . Hypertension  Mother   . Hypertension Father   . Arthritis Sister   . Arthritis Sister   . Diabetes Brother   . Hypertension Sister   . Anesthesia problems Neg Hx   . Hypotension Neg Hx   . Malignant hyperthermia Neg Hx   . Pseudochol deficiency Neg Hx   . Heart attack Neg Hx     Review of Systems  Constitutional: Positive for appetite change and fever.  HENT: Negative for congestion, ear pain, sinus pain, sinus pressure and sore throat.   Respiratory: Positive for cough. Negative for shortness of breath and wheezing.   Gastrointestinal: Negative for abdominal pain, diarrhea and nausea.  Musculoskeletal: Positive for myalgias.  Neurological: Positive for headaches. Negative for light-headedness.       Objective:   Vitals:   05/05/16 1600  BP: 122/82  Pulse: 83  Resp: 18  Temp: 98.6 F (37 C)   Filed Weights   05/05/16 1600  Weight: 124 lb (56.2 kg)   Body mass index is 23.43 kg/m.  Wt Readings from Last 3 Encounters:  05/05/16 124 lb (56.2 kg)  03/30/16 123 lb 12.8 oz (56.2 kg)  03/24/16 123 lb (55.8 kg)     Physical Exam GENERAL APPEARANCE: Appears stated age, chronically ill appearing, NAD EYES: conjunctiva clear, no icterus HEENT: bilateral tympanic membranes and ear canals normal, oropharynx with mild erythema,  thyromegaly, trachea midline, no cervical or supraclavicular lymphadenopathy LUNGS/CHEST: Mild tenderness right upper chest with palpation, Clear to auscultation without wheeze or crackles, unlabored breathing, good air entry bilaterally HEART: Normal S1,S2 without murmurs ABDOMEN:  Soft, non tender EXTREMITIES: Without clubbing, cyanosis, or edema        Assessment &  Plan:   See Problem List for Assessment and Plan of chronic medical problems.

## 2016-05-05 NOTE — Assessment & Plan Note (Signed)
Check cbc, cmp, tsh/ft4

## 2016-05-05 NOTE — Assessment & Plan Note (Signed)
?   Radiation pneumonitis vs infection cxr today Will discuss with dr Julien Nordmann Start Zpak Prednisone taper

## 2016-05-05 NOTE — Patient Instructions (Signed)
  Test(s) ordered today. Your results will be released to [facility] (or called to you) after review, usually within 72hours after test completion. If any changes need to be made, you will be notified at that same time.  Start the antibiotic, prednisone taper and increase the metformin to twice a day.

## 2016-05-05 NOTE — Assessment & Plan Note (Signed)
Check tsh, free T4

## 2016-05-05 NOTE — Assessment & Plan Note (Signed)
Sugars slightly elevated at home after we decreased metformin to once daily Increase metformin to twice daily Continue to monitor sugars at home

## 2016-05-06 ENCOUNTER — Telehealth: Payer: Self-pay | Admitting: Internal Medicine

## 2016-05-06 DIAGNOSIS — E0590 Thyrotoxicosis, unspecified without thyrotoxic crisis or storm: Secondary | ICD-10-CM

## 2016-05-06 LAB — TSH: TSH: 0.02 u[IU]/mL — ABNORMAL LOW (ref 0.35–4.50)

## 2016-05-06 LAB — T4, FREE: Free T4: 1.09 ng/dL (ref 0.60–1.60)

## 2016-05-06 NOTE — Telephone Encounter (Signed)
Referral ordered for endo  I think the changes on the cxr represent changes from the radiation since she just had the Ct scan one month ago.

## 2016-05-06 NOTE — Telephone Encounter (Signed)
CXR shows area in right lung where her cancer is - appears there is effects from the radiation.  No obvious pneumonia.  Dr Julien Nordmann did not have any other suggestions and agrees with the medications I prescribed.   Her anemia is mild and stable. Kidney and liver tests are normal.  Thyroid function is too high.  [ see if lab can add on thyroid antibodies]   Confirm she is not on any medication for her thyroid at this time.  Her thyroid is overactive - if not on any medication I need to refer her to a thyroid specialist for further evaluation / treatment

## 2016-05-06 NOTE — Telephone Encounter (Signed)
Spoke with pt to inform. Pt is okay with have the referral placed for her thyroid. She would like to know if the spot in her right lung is concerning. She does not see Dr Julien Nordmann until March for a follow-up.

## 2016-05-07 NOTE — Telephone Encounter (Signed)
Yes, ok to wait

## 2016-05-07 NOTE — Telephone Encounter (Signed)
Informed pt that she should contact Dr Julien Nordmann in regards to the chest x-ray change. Pt understood. She stated she has an appt with Endo at the end of Feb. Would like to know if it is okay to wait until them.

## 2016-05-08 ENCOUNTER — Telehealth: Payer: Self-pay | Admitting: Medical Oncology

## 2016-05-08 NOTE — Telephone Encounter (Signed)
Saw PCP for cold, cough and low grade fever - cxr changes present no pneumonia , but "inflammation' . Pt treated with zpak -3 more days left and prednisone taper pak . PCP said to f/u with [facility].

## 2016-05-08 NOTE — Telephone Encounter (Signed)
Only if symptoms shows no improvement in 1-2 weeks.

## 2016-05-11 NOTE — Telephone Encounter (Signed)
Pt notified. ' I am better".

## 2016-06-02 ENCOUNTER — Telehealth: Payer: Self-pay | Admitting: Medical Oncology

## 2016-06-02 ENCOUNTER — Other Ambulatory Visit: Payer: Self-pay | Admitting: Medical Oncology

## 2016-06-02 NOTE — Telephone Encounter (Addendum)
Pt called and said she really is not getting any better with her symptoms. Persistent cough . Denies fever, weakness.   "I' m not particularly SOB , its just the aggravating cough that lingers on and pain in Right front chest ( surgery side and xrt). She completed antibiotics by PCP 2 weeks ago. Per Julien Nordmann move up CT and f/u. I explained this pt pt and told her to ED if symptoms worsen.

## 2016-06-08 ENCOUNTER — Ambulatory Visit (HOSPITAL_COMMUNITY)
Admission: RE | Admit: 2016-06-08 | Discharge: 2016-06-08 | Disposition: A | Discharge status: Home / Self Care | Payer: Medicare Other | Source: Ambulatory Visit | Attending: Internal Medicine | Admitting: Internal Medicine

## 2016-06-08 ENCOUNTER — Other Ambulatory Visit (HOSPITAL_BASED_OUTPATIENT_CLINIC_OR_DEPARTMENT_OTHER): Payer: Medicare Other

## 2016-06-08 ENCOUNTER — Encounter (HOSPITAL_COMMUNITY): Payer: Self-pay

## 2016-06-08 DIAGNOSIS — J90 Pleural effusion, not elsewhere classified: Secondary | ICD-10-CM | POA: Diagnosis not present

## 2016-06-08 DIAGNOSIS — I700 Atherosclerosis of aorta: Secondary | ICD-10-CM | POA: Diagnosis not present

## 2016-06-08 DIAGNOSIS — R059 Cough, unspecified: Secondary | ICD-10-CM

## 2016-06-08 DIAGNOSIS — R05 Cough: Secondary | ICD-10-CM

## 2016-06-08 DIAGNOSIS — Z5111 Encounter for antineoplastic chemotherapy: Secondary | ICD-10-CM

## 2016-06-08 DIAGNOSIS — E119 Type 2 diabetes mellitus without complications: Secondary | ICD-10-CM

## 2016-06-08 DIAGNOSIS — C3431 Malignant neoplasm of lower lobe, right bronchus or lung: Secondary | ICD-10-CM

## 2016-06-08 LAB — COMPREHENSIVE METABOLIC PANEL
ALBUMIN: 3.4 g/dL — AB (ref 3.5–5.0)
ALK PHOS: 79 U/L (ref 40–150)
ALT: 11 U/L (ref 0–55)
ANION GAP: 11 meq/L (ref 3–11)
AST: 14 U/L (ref 5–34)
BILIRUBIN TOTAL: 0.25 mg/dL (ref 0.20–1.20)
BUN: 13.3 mg/dL (ref 7.0–26.0)
CALCIUM: 9.8 mg/dL (ref 8.4–10.4)
CHLORIDE: 105 meq/L (ref 98–109)
CO2: 24 mEq/L (ref 22–29)
CREATININE: 0.7 mg/dL (ref 0.6–1.1)
EGFR: 78 mL/min/{1.73_m2} — ABNORMAL LOW (ref >90)
Glucose: 94 mg/dl (ref 70–140)
Potassium: 3.7 mEq/L (ref 3.5–5.1)
Sodium: 139 mEq/L (ref 136–145)
TOTAL PROTEIN: 7 g/dL (ref 6.4–8.3)

## 2016-06-08 LAB — CBC WITH DIFFERENTIAL/PLATELET
BASO%: 0.1 % (ref 0.0–2.0)
Basophils Absolute: 0 10*3/uL (ref 0.0–0.1)
EOS%: 1.1 % (ref 0.0–7.0)
Eosinophils Absolute: 0.1 10*3/uL (ref 0.0–0.5)
HEMATOCRIT: 35.4 % (ref 34.8–46.6)
HEMOGLOBIN: 11.5 g/dL — AB (ref 11.6–15.9)
LYMPH%: 8.1 % — ABNORMAL LOW (ref 14.0–49.7)
MCH: 27.5 pg (ref 25.1–34.0)
MCHC: 32.5 g/dL (ref 31.5–36.0)
MCV: 84.7 fL (ref 79.5–101.0)
MONO#: 0.5 10*3/uL (ref 0.1–0.9)
MONO%: 7.3 % (ref 0.0–14.0)
NEUT%: 83.4 % — ABNORMAL HIGH (ref 38.4–76.8)
NEUTROS ABS: 5.9 10*3/uL (ref 1.5–6.5)
PLATELETS: 275 10*3/uL (ref 145–400)
RBC: 4.18 10*6/uL (ref 3.70–5.45)
RDW: 14.4 % (ref 11.2–14.5)
WBC: 7.1 10*3/uL (ref 3.9–10.3)
lymph#: 0.6 10*3/uL — ABNORMAL LOW (ref 0.9–3.3)

## 2016-06-08 MED ORDER — IOPAMIDOL (ISOVUE-300) INJECTION 61%
INTRAVENOUS | Status: AC
  Filled 2016-06-08: qty 75

## 2016-06-08 MED ORDER — IOPAMIDOL (ISOVUE-300) INJECTION 61%
75.0000 mL | Freq: Once | INTRAVENOUS | Status: AC | PRN
  Administered 2016-06-08: 75 mL via INTRAVENOUS

## 2016-06-08 MED ORDER — SODIUM CHLORIDE 0.9 % IJ SOLN
INTRAMUSCULAR | Status: AC
  Filled 2016-06-08: qty 50

## 2016-06-09 ENCOUNTER — Telehealth: Payer: Self-pay | Admitting: Medical Oncology

## 2016-06-09 NOTE — Telephone Encounter (Signed)
Request earlier appt -done

## 2016-06-10 ENCOUNTER — Ambulatory Visit (HOSPITAL_BASED_OUTPATIENT_CLINIC_OR_DEPARTMENT_OTHER): Payer: Medicare Other

## 2016-06-10 ENCOUNTER — Encounter: Payer: Self-pay | Admitting: *Deleted

## 2016-06-10 ENCOUNTER — Ambulatory Visit (HOSPITAL_BASED_OUTPATIENT_CLINIC_OR_DEPARTMENT_OTHER): Payer: Medicare Other | Admitting: Internal Medicine

## 2016-06-10 ENCOUNTER — Telehealth: Payer: Self-pay | Admitting: Internal Medicine

## 2016-06-10 ENCOUNTER — Encounter: Payer: Self-pay | Admitting: Internal Medicine

## 2016-06-10 VITALS — BP 147/71 | HR 78 | Temp 98.4°F | Resp 18 | Ht 61.0 in | Wt 124.9 lb

## 2016-06-10 DIAGNOSIS — Z5111 Encounter for antineoplastic chemotherapy: Secondary | ICD-10-CM

## 2016-06-10 DIAGNOSIS — R05 Cough: Secondary | ICD-10-CM

## 2016-06-10 DIAGNOSIS — C3431 Malignant neoplasm of lower lobe, right bronchus or lung: Secondary | ICD-10-CM

## 2016-06-10 NOTE — Progress Notes (Signed)
[facility] [phone]   [phone]  OFFICE PROGRESS NOTE  Binnie Rail, MD [place] [address]  DIAGNOSIS: Recurrent non-small cell lung cancer presented with unresectable a stage II a (T1a, N1, M0) non-small cell lung cancer, adenocarcinoma presented with right perihilar lower lobe soft tissue mass with right hilar adenopathy diagnosed in August 2017. The patient has a history of stage Ib non-small cell lung cancer diagnosed in March 2013.  PRIOR THERAPY: 1) status post right upper lobectomy with lymph node dissection in March 2013 and the tumor was negative for EGFR and ALK mutations. 2) concurrent chemoradiation with weekly carboplatin for AUC of 2 and paclitaxel 45 MG/M2, status post 6 cycles last dose was given 02/10/2016 with partial response.  CURRENT THERAPY: Observation.  INTERVAL HISTORY: Samantha Quinn 83 y.o. female came to the clinic today accompanied by her 2 daughters. The patient is feeling fine today except for the persistent dry cough. She also has shortness of breath with exertion. She does not take her cough medical condition as prescribed. She denied having any chest pain or hemoptysis. She has no fever or chills. She denied having any recent weight loss or night sweats. She has no nausea, vomiting, diarrhea or constipation. She had repeat CT scan of the chest performed recently and she is here for evaluation and discussion of her scan results and treatment options.  MEDICAL HISTORY: Past Medical History:  Diagnosis Date  . Back pain    bulding disc  . Bronchitis    hx of-many,many years   . Diabetes mellitus   . Diabetes mellitus    type 2 and takes Metformin bid  . Dyslipidemia   . Embolism - blood clot    hx of in left lower leg and was on Coumadin for about 31month;this was about 8-161yrago  . Family history of adverse reaction to anesthesia    Daughter- Nausea  . History of colonic polyps   . HOH  (hard of hearing)   . Hx of migraines    as a teenager  . Hypercholesterolemia   . Hyperlipidemia    takes Lovastatin every other day  . Hypertension    takes Ziac daily and Ramipril as well  . Hypothyroidism    takes Synthroid every other day  . Insomnia    takes Elavil nightly  . Joint pain   . Lung mass   . Myalgia   . Osteoarthritis   . Osteoporosis   . Pneumothorax of right lung after biopsy 07/09/11   HAD CHEST TUBE PLACEMENT  . PONV (postoperative nausea and vomiting)   . Primary adenocarcinoma of lower lobe of right lung [place]   pT2a, pN0 M0 Stage 1 Well differenced Adenocarcinoma 3.5 cm resected 07/17/2011  . Primary adenocarcinoma of lung ([facility]2013  . Shortness of breath dyspnea    With exertion  . Skin cancer    legs have dark spots on them  . Upper respiratory infection 04/2011  . Urinary incontinence    takes Detrol daily prn    ALLERGIES:  is allergic to codeine; tramadol; and demerol.  MEDICATIONS:  Current Outpatient Prescriptions  Medication Sig Dispense Refill  . amitriptyline (ELAVIL) 10 MG tablet Take 1 tablet (10 mg total) by mouth at bedtime as needed. (Patient taking differently: Take 5 mg by mouth at bedtime. ) 90 tablet 1  . bisoprolol-hydrochlorothiazide (ZIAC) 2.5-6.25 MG tablet Take 1 tablet by mouth daily.  90 tablet 3  . Blood Glucose Monitoring Suppl (ONE TOUCH ULTRA 2) w/Device KIT Use to check blood sugars twice a day Dx E11.9 1 each 0  . calcium carbonate (OS-CAL) 600 MG tablet Take 600 mg by mouth daily.    [name] gabapentin (NEURONTIN) 100 MG capsule Take 200 mg by mouth 3 (three) times daily.     [name] glucose blood (ONE TOUCH ULTRA TEST) test strip 1 each by Other route 2 (two) times daily. Use to check blood sugars twice a day Dx E11.9 100 each 3  . ibuprofen (ADVIL,MOTRIN) 200 MG tablet Take 200 mg by mouth every 6 (six) hours as needed for fever, headache or mild pain.     [name] lovastatin (MEVACOR) 20 MG tablet Take 1 tablet (20  mg total) by mouth every other day. 90 tablet 1  . metFORMIN (GLUCOPHAGE) 500 MG tablet Take 1 tablet (500 mg total) by mouth 2 (two) times daily with a meal. 180 tablet 3  . ONETOUCH DELICA LANCETS FINE MISC Use one lancet to check blood sugar twice daily 100 each 3  . ramipril (ALTACE) 2.5 MG capsule Take 1 capsule (2.5 mg total) by mouth daily. 90 capsule 2   No current facility-administered medications for this visit.     SURGICAL HISTORY:  Past Surgical History:  Procedure Laterality Date  . ABDOMINAL HYSTERECTOMY    . APPENDECTOMY     as a child  . CHEST TUBE REMOVAL  07/13/11   RIGHT  CHEST TUBE  . CHOLECYSTECTOMY    . COLONOSCOPY    . COLONOSCOPY    . DILATION AND CURETTAGE OF UTERUS     x 2  . EYE SURGERY Bilateral    Cataract  . HEMORRHOID SURGERY    . LUNG BIOPSY  07/09/11   RIGHT UPER LOBE LUNG MASS   . RIGHT CHEST TUBE PLACEMENT  07/10/11   S/P LUNG BX  . ROTATOR CUFF REPAIR     bilateral  . TOE SURGERY  2013   right small toe  . TONSILLECTOMY     as a child  . VIDEO BRONCHOSCOPY  07/17/2011   Procedure: VIDEO BRONCHOSCOPY;  Surgeon: Grace Isaac, MD;  Location: [facility];  Service: Thoracic;  Laterality: N/A;  . VIDEO BRONCHOSCOPY WITH ENDOBRONCHIAL ULTRASOUND N/A 12/10/2015   Procedure: VIDEO BRONCHOSCOPY WITH ENDOBRONCHIAL ULTRASOUND with biopsies.;  Surgeon: Grace Isaac, MD;  Location: [facility];  Service: Thoracic;  Laterality: N/A;    REVIEW OF SYSTEMS:  Constitutional: positive for fatigue Eyes: negative Ears, nose, mouth, throat, and face: negative Respiratory: positive for cough and dyspnea on exertion Cardiovascular: negative Gastrointestinal: negative Genitourinary:negative Integument/breast: negative Hematologic/lymphatic: negative Musculoskeletal:negative Neurological: negative Behavioral/Psych: negative Endocrine: negative Allergic/Immunologic: negative   PHYSICAL EXAMINATION: General appearance: alert, cooperative, fatigued and no  distress Head: Normocephalic, without obvious abnormality, atraumatic Neck: no adenopathy, no JVD, supple, symmetrical, trachea midline and thyroid not enlarged, symmetric, no tenderness/mass/nodules Lymph nodes: Cervical, supraclavicular, and axillary nodes normal. Resp: clear to auscultation bilaterally Back: symmetric, no curvature. ROM normal. No CVA tenderness. Cardio: regular rate and rhythm, S1, S2 normal, no murmur, click, rub or gallop GI: soft, non-tender; bowel sounds normal; no masses,  no organomegaly Extremities: extremities normal, atraumatic, no cyanosis or edema Neurologic: Alert and oriented X 3, normal strength and tone. Normal symmetric reflexes. Normal coordination and gait  ECOG PERFORMANCE STATUS: 1 - Symptomatic but completely ambulatory  Blood pressure (!) 147/71, pulse 78, temperature 98.4 F (36.9 C), temperature source Oral,  resp. rate 18, height '5\' 1"'  (1.549 m), weight 124 lb 14.4 oz (56.7 kg), SpO2 98 %.  LABORATORY DATA: Lab Results  Component Value Date   WBC 7.1 06/08/2016   HGB 11.5 (L) 06/08/2016   HCT 35.4 06/08/2016   MCV 84.7 06/08/2016   PLT 275 06/08/2016      Chemistry      Component Value Date/Time   NA 139 06/08/2016 1316   K 3.7 06/08/2016 1316   CL 98 05/05/2016 1702   CO2 24 06/08/2016 1316   BUN 13.3 06/08/2016 1316   CREATININE 0.7 06/08/2016 1316      Component Value Date/Time   CALCIUM 9.8 06/08/2016 1316   ALKPHOS 79 06/08/2016 1316   AST 14 06/08/2016 1316   ALT 11 06/08/2016 1316   BILITOT 0.25 06/08/2016 1316       RADIOGRAPHIC STUDIES: Ct Chest W Contrast  Result Date: 06/09/2016 CLINICAL DATA:  Restaging lung cancer EXAM: CT CHEST WITH CONTRAST TECHNIQUE: Multidetector CT imaging of the chest was performed during intravenous contrast administration. CONTRAST:  27m ISOVUE-300 IOPAMIDOL (ISOVUE-300) INJECTION 61% COMPARISON:  03/26/2016 FINDINGS: Cardiovascular: The heart size appears within normal limits. There  is no pericardial effusion. Aortic atherosclerosis. Mediastinum/Nodes: The trachea appears patent and is midline. Normal appearance of the esophagus. No mediastinal adenopathy. No hilar adenopathy identified. Lungs/Pleura: Small right pleural effusion is new from previous exam. There are postsurgical changes in volume loss from a right upper lobectomy. Progressive changes of external beam radiation identified involving the right mid and upper lung zones. The soft tissue scratch set index lesion in the right upper lung is un described on most recent previous exam is obscured by changes of external beam radiation including consolidation and fibrosis. The index measurement within the right lower lung measures 2.3 x 1.8 cm, image 69 of series 5. Previously this measured 2.0 x 1.6 cm. The there is a new nodule within the right lower lobe which measures 7 mm, image 82 of series 5. Suspicious. Stable left lower lobe lung nodule measuring 5 mm, image 78 of series 5. Upper Abdomen: No acute findings identified. Large peripherally calcified cyst within the spleen is unchanged. Prior cholecystectomy. Musculoskeletal: There is no aggressive lytic or sclerotic bone lesions identified. IMPRESSION: 1. Slight increase in size of index lesion within the central right lower lobe, which may reflect progression of disease. Additionally, there is a new nodule within the periphery of the right lower lobe which measures 7 mm and is worrisome for metastasis. There is also a new right pleural effusion. 2. Progressive changes of external beam radiation within the right upper lung zone which obscures the index lesion within this area. 3. Aortic atherosclerosis. Electronically Signed   By: TKerby MoorsM.D.   On: 06/09/2016 08:54    ASSESSMENT AND PLAN:  This is a very pleasant 83years old white female with recurrent non-small cell lung cancer presented as unresectable a stage IIA adenocarcinoma with the right perihilar lower lobe  nodule in addition to right hilar adenopathy. She underwent a course of concurrent chemoradiation with weekly carboplatin and paclitaxel. The patient has been on observation for the last few months. She had repeat CT scan of the chest performed recently. I personally and independently reviewed the scan images and discuss the results with the patient and her daughters today. Unfortunately scan showed some mild evidence for disease progression in the right perihilar mass but there were also a new small pulmonary nodules in the left and right  lung. I discussed with the patient several options for management of her condition including continuous observation and close monitoring versus consideration of treatment with immunotherapy with either Nivolumab or Tecentriq (Atezolizumab). She is also interested in consideration of molecular study to see if she has any actionable mutations. I will sent a blood test to [facility] for molecular studies. I will see the patient back for follow-up visit in 3 weeks for evaluation and discussion of her treatment options based on the molecular studies. For the cough, she will continue with Hycodan for now. She was treated with steroid in the past for the radiation induced pneumonitis. I strongly recommend for the patient to call immediately if she has any concerning symptoms in the interval. The patient voices understanding of current disease status and treatment options and is in agreement with the current care plan.  All questions were answered. The patient knows to call the clinic with any problems, questions or concerns. We can certainly see the patient much sooner if necessary. I spent 15 minutes counseling the patient face to face. The total time spent in the appointment was 25 minutes.  Disclaimer: This note was dictated with voice recognition software. Similar sounding words can inadvertently be transcribed and may not be corrected upon review.

## 2016-06-10 NOTE — Telephone Encounter (Signed)
Gave patient avs report and appointments for March. Per 2/14 los f/u 3 weeks. Patient already has f/u on schedule 3/5.

## 2016-06-10 NOTE — Progress Notes (Signed)
Oncology Nurse Navigator Documentation  Oncology Nurse Navigator Flowsheets 06/10/2016  Navigator Location [facility]  Navigator Encounter Type Other/per Dr. Worthy Flank request, I complete [facility] form and gave to lab.    Treatment Phase Follow-up  Barriers/Navigation Needs Coordination of Care  Interventions Coordination of Care  Coordination of Care Other  Acuity Level 1  Time Spent with Patient 15

## 2016-06-15 ENCOUNTER — Ambulatory Visit: Payer: Medicare Other | Admitting: Internal Medicine

## 2016-06-17 ENCOUNTER — Encounter: Payer: Self-pay | Admitting: Internal Medicine

## 2016-06-17 ENCOUNTER — Ambulatory Visit (INDEPENDENT_AMBULATORY_CARE_PROVIDER_SITE_OTHER): Payer: Medicare Other | Admitting: Internal Medicine

## 2016-06-17 VITALS — BP 118/74 | HR 86 | Ht 60.5 in | Wt 125.0 lb

## 2016-06-17 DIAGNOSIS — E0590 Thyrotoxicosis, unspecified without thyrotoxic crisis or storm: Secondary | ICD-10-CM | POA: Diagnosis not present

## 2016-06-17 LAB — TSH: TSH: 0.02 u[IU]/mL — ABNORMAL LOW (ref 0.35–4.50)

## 2016-06-17 LAB — T4, FREE: Free T4: 0.81 ng/dL (ref 0.60–1.60)

## 2016-06-17 LAB — T3, FREE: T3, Free: 3.2 pg/mL (ref 2.3–4.2)

## 2016-06-17 NOTE — Patient Instructions (Addendum)
Please stop at the lab.  I will let you know about the results and further plans.  If we end up starting Methimazole, please stop the Methimazole (Tapazole) and call us or your primary care doctor if you develop: - sore throat - fever - yellow skin - dark urine - light colored stools As we will then need to check your blood counts and liver tests.  Please come back for a follow-up appointment in 3 months.   Hyperthyroidism Hyperthyroidism is when the thyroid is too active (overactive). Your thyroid is a large gland that is located in your neck. The thyroid helps to control how your body uses food (metabolism). When your thyroid is overactive, it produces too much of a hormone called thyroxine. What are the causes? Causes of hyperthyroidism may include:  Graves disease. This is when your immune system attacks the thyroid gland. This is the most common cause.  Inflammation of the thyroid gland.  Tumor in the thyroid gland or somewhere else.  Excessive use of thyroid medicines, including:  Prescription thyroid supplement.  Herbal supplements that mimic thyroid hormones.  Solid or fluid-filled lumps within your thyroid gland (thyroid nodules).  Excessive ingestion of iodine. What increases the risk?  Being female.  Having a family history of thyroid conditions. What are the signs or symptoms? Signs and symptoms of hyperthyroidism may include:  Nervousness.  Inability to tolerate heat.  Unexplained weight loss.  Diarrhea.  Change in the texture of hair or skin.  Heart skipping beats or making extra beats.  Rapid heart rate.  Loss of menstruation.  Shaky hands.  Fatigue.  Restlessness.  Increased appetite.  Sleep problems.  Enlarged thyroid gland or nodules. How is this diagnosed? Diagnosis of hyperthyroidism may include:  Medical history and physical exam.  Blood tests.  Ultrasound tests. How is this treated? Treatment may  include:  Medicines to control your thyroid.  Surgery to remove your thyroid.  Radiation therapy. Follow these instructions at home:  Take medicines only as directed by your health care provider.  Do not use any tobacco products, including cigarettes, chewing tobacco, or electronic cigarettes. If you need help quitting, ask your health care provider.  Do not exercise or do physical activity until your health care provider approves.  Keep all follow-up appointments as directed by your health care provider. This is important. Contact a health care provider if:  Your symptoms do not get better with treatment.  You have fever.  You are taking thyroid replacement medicine and you:  Have depression.  Feel mentally and physically slow.  Have weight gain. Get help right away if:  You have decreased alertness or a change in your awareness.  You have abdominal pain.  You feel dizzy.  You have a rapid heartbeat.  You have an irregular heartbeat. This information is not intended to replace advice given to you by your health care provider. Make sure you discuss any questions you have with your health care provider. Document Released: 04/13/2005 Document Revised: 09/12/2015 Document Reviewed: 08/29/2013 Elsevier Interactive Patient Education  2017 [employer]   Radioiodine (I-131) Therapy for Hyperthyroidism Radioiodine (I-131) therapy is a procedure to treat an overactive thyroid gland (hyperthyroidism). The thyroid is a gland in the neck that uses iodine to help control how the body uses food (metabolism). In this procedure, you swallow a pill or liquid that contains I-131. I-131 is manufactured (synthetic) iodine that gives off radiation. This destroys thyroid cells and reverses hyperthyroidism. Tell a health care provider  about:  Any allergies you have.  All medicines you are taking, including vitamins, herbs, eye drops, creams, and over-the-counter medicines.  Any  problems you or family members have had with anesthetic medicines.  Any blood disorders you have.  Any surgeries you have had.  Any medical conditions you have.  Whether you are pregnant, may be pregnant, or have gone through menopause, if this applies.  Whether you currently have children.  Whether you plan to have children in the next 2 years.  Any contact you have with children or pregnant women.  Your travel plans for the next 3 months.  Whether you pass through radiation detectors for work or travel. What are the risks? Generally, this is a safe procedure. However, problems may occur, including:  Damage to other structures or organs, such as the salivary glands. This could lead to dry mouth and loss of taste.  Low sperm count, if this applies. This may lead to temporary infertility.  Sore throat or neck pain. This is temporary.  Slightlyincreased risk of thyroid cancer.  Nausea or vomiting. What happens before the procedure?  Ask your health care provider about changing or stopping your regular medicines. This is especially important if you are taking diabetes medicines, blood thinners, or thyroid medicines.  Women may be asked to take a pregnancy test.  Women who are breastfeeding should plan to stop at least 6 weeks before the procedure.  Follow instructions from your health care provider about eating or drinking restrictions.  Plan to avoid contact with others for 1 week after your treatment. It is most important to avoid contact with children and pregnant women. To do this, plan to stay home from work, arrange child care, and sleep alone, if these things apply to you.  Plan to drive yourself home after treatment. Do not take public transportation. If you need someone to drive you home, sit as far away from the driver as possible. What happens during the procedure?  You will be given a dose of I-131 to swallow. It may be a pill or a liquid.  Your thyroid gland  will absorb the I-131 over the next 3 months. The treatment process will be complete in about 6 months. What happens after the procedure?  You may need to stay in the hospital for 24 hours after your treatment. This depends on the requirements in your state.  Follow instructions from your health care provider about:  How to take care of yourself after the procedure.  How to protect others from exposure to radiation as it leaves your body. This information is not intended to replace advice given to you by your health care provider. Make sure you discuss any questions you have with your health care provider. Document Released: 08/30/2008 Document Revised: 09/17/2015 Document Reviewed: 08/08/2014 Elsevier Interactive Patient Education  2017 [employer].

## 2016-06-17 NOTE — Progress Notes (Signed)
Patient ID: Samantha Quinn, female   DOB: 04/17/1928, 83 y.o.   MRN: [id number]    HPI  Samantha Quinn is a 83 y.o.-year-old female, referred by her PCP, Dr. Quay Burow, for evaluation for thyrotoxicosis.  Pt has a h/o Lung CA - had RxTx and will re-start ChTx.  She has a history of thyroiditis with subsequent hypothyroidism and was on levothyroxine for many years (cannot remember exactly when she started). The TSH levels were suppressed and the levothyroxine dose continued to be decreased until he was completely stopped few mo ago.  I reviewed pt's thyroid tests: Lab Results  Component Value Date   TSH 0.02 (L) 05/05/2016   TSH <0.080 (L) 01/27/2016   TSH 0.01 (L) 11/12/2015   TSH 0.01 (L) 07/10/2015   TSH <0.008 (L) 03/08/2015   TSH 0.02 (L) 09/25/2014   TSH 0.03 (L) 04/11/2014   TSH 0.01 (L) 08/18/2013   TSH 0.02 (L) 09/29/2012   TSH 0.02 (L) 06/03/2012   FREET4 1.09 05/05/2016   FREET4 1.32 11/12/2015   FREET4 1.47 07/10/2015   FREET4 1.60 03/08/2015   FREET4 1.42 08/18/2013   FREET4 1.25 09/29/2012   FREET4 1.08 09/09/2011   FREET4 1.24 02/27/2011    She has a history of thyroid nodules, with a thyroid ultrasound in 08/29/2013 and the subsequent biopsy of the dominant right mid nodule measuring 3.1 x 1.9 x 2.1 cm. This was benign.  Pt denies feeling nodules in neck, hoarseness, dysphagia/odynophagia, SOB with lying down; she c/o: - + fatigue - + heat intolerance - no tremors - no anxiety - no palpitations - no hyperdefecation - no weight loss - + hair loss  Pt does have a FH of thyroid ds.: mother - thyroid removed; sister - ?. No FH of thyroid cancer. No h/o radiation tx to head or neck. Had RxTx  - lung.  She had recent contrast studies: CT chest with contrast on 06/09/2016. No steroid use. No herbal supplements. No Biotin use.  Pt. also has a history of DM.   ROS: Constitutional: + see HPI Eyes: no blurry vision, no xerophthalmia ENT: no sore throat, + see  HPI Cardiovascular: no CP/palpitations/leg swelling Respiratory: + cough/+ SOB  - RxTx Gastrointestinal: no N/V/D/C Musculoskeletal: no muscle/joint aches Skin: no rashes, + hair loss 2/2 ChTx Neurological: no tremors/numbness/tingling/dizziness Psychiatric: no depression/anxiety  Past Medical History:  Diagnosis Date  . Back pain    bulding disc  . Bronchitis    hx of-many,many years   . Diabetes mellitus   . Diabetes mellitus    type 2 and takes Metformin bid  . Dyslipidemia   . Embolism - blood clot    hx of in left lower leg and was on Coumadin for about 46month;this was about 8-179yrago  . Family history of adverse reaction to anesthesia    Daughter- Nausea  . History of colonic polyps   . HOH (hard of hearing)   . Hx of migraines    as a teenager  . Hypercholesterolemia   . Hyperlipidemia    takes Lovastatin every other day  . Hypertension    takes Ziac daily and Ramipril as well  . Hypothyroidism    takes Synthroid every other day  . Insomnia    takes Elavil nightly  . Joint pain   . Lung mass   . Myalgia   . Osteoarthritis   . Osteoporosis   . Pneumothorax of right lung after biopsy 07/09/11   HAD CHEST TUBE  PLACEMENT  . PONV (postoperative nausea and vomiting)   . Primary adenocarcinoma of lower lobe of right lung ([facility]) 06/24/2011   pT2a, pN0 M0 Stage 1 Well differenced Adenocarcinoma 3.5 cm resected 07/17/2011  . Primary adenocarcinoma of lung ([facility]) 06/24/2011  . Shortness of breath dyspnea    With exertion  . Skin cancer    legs have dark spots on them  . Upper respiratory infection 04/2011  . Urinary incontinence    takes Detrol daily prn   Past Surgical History:  Procedure Laterality Date  . ABDOMINAL HYSTERECTOMY    . APPENDECTOMY     as a child  . CHEST TUBE REMOVAL  07/13/11   RIGHT  CHEST TUBE  . CHOLECYSTECTOMY    . COLONOSCOPY    . COLONOSCOPY    . DILATION AND CURETTAGE OF UTERUS     x 2  . EYE SURGERY Bilateral    Cataract  .  HEMORRHOID SURGERY    . LUNG BIOPSY  07/09/11   RIGHT UPER LOBE LUNG MASS   . RIGHT CHEST TUBE PLACEMENT  07/10/11   S/P LUNG BX  . ROTATOR CUFF REPAIR     bilateral  . TOE SURGERY  2013   right small toe  . TONSILLECTOMY     as a child  . VIDEO BRONCHOSCOPY  07/17/2011   Procedure: VIDEO BRONCHOSCOPY;  Surgeon: Grace Isaac, MD;  Location: [facility];  Service: Thoracic;  Laterality: N/A;  . VIDEO BRONCHOSCOPY WITH ENDOBRONCHIAL ULTRASOUND N/A 12/10/2015   Procedure: VIDEO BRONCHOSCOPY WITH ENDOBRONCHIAL ULTRASOUND with biopsies.;  Surgeon: Grace Isaac, MD;  Location: [facility];  Service: Thoracic;  Laterality: N/A;   Social History   Social History  . Marital status: Widowed    Spouse name: N/A  . Number of children: 3   Occupational History  . homemaker   Social History Main Topics  . Smoking status: Never Smoker  . Smokeless tobacco: Never Used  . Alcohol use No  . Drug use: No   Current Outpatient Prescriptions on File Prior to Visit  Medication Sig Dispense Refill  . amitriptyline (ELAVIL) 10 MG tablet Take 1 tablet (10 mg total) by mouth at bedtime as needed. (Patient taking differently: Take 5 mg by mouth at bedtime. ) 90 tablet 1  . bisoprolol-hydrochlorothiazide (ZIAC) 2.5-6.25 MG tablet Take 1 tablet by mouth daily. 90 tablet 3  . Blood Glucose Monitoring Suppl (ONE TOUCH ULTRA 2) w/Device KIT Use to check blood sugars twice a day Dx E11.9 1 each 0  . calcium carbonate (OS-CAL) 600 MG tablet Take 600 mg by mouth daily.    [name] gabapentin (NEURONTIN) 100 MG capsule Take 200 mg by mouth 3 (three) times daily.     [name] glucose blood (ONE TOUCH ULTRA TEST) test strip 1 each by Other route 2 (two) times daily. Use to check blood sugars twice a day Dx E11.9 100 each 3  . ibuprofen (ADVIL,MOTRIN) 200 MG tablet Take 200 mg by mouth every 6 (six) hours as needed for fever, headache or mild pain.     [name] lovastatin (MEVACOR) 20 MG tablet Take 1 tablet (20 mg total) by mouth every  other day. 90 tablet 1  . metFORMIN (GLUCOPHAGE) 500 MG tablet Take 1 tablet (500 mg total) by mouth 2 (two) times daily with a meal. 180 tablet 3  . ONETOUCH DELICA LANCETS FINE MISC Use one lancet to check blood sugar twice daily 100 each 3  . ramipril (ALTACE) 2.5  MG capsule Take 1 capsule (2.5 mg total) by mouth daily. 90 capsule 2   No current facility-administered medications on file prior to visit.    Allergies  Allergen Reactions  . Codeine Nausea And Vomiting  . Tramadol Other (See Comments)    syncope  . Demerol Nausea Only   Family History  Problem Relation Age of Onset  . Hypertension Mother   . Hypertension Father   . Arthritis Sister   . Arthritis Sister   . Diabetes Brother   . Hypertension Sister   . Anesthesia problems Neg Hx   . Hypotension Neg Hx   . Malignant hyperthermia Neg Hx   . Pseudochol deficiency Neg Hx   . Heart attack Neg Hx    PE: BP 118/74 (BP Location: Left Arm, Patient Position: Sitting)   Pulse 86   Ht 5' 0.5" (1.537 m)   Wt 125 lb (56.7 kg)   SpO2 96%   BMI 24.01 kg/m  Wt Readings from Last 3 Encounters:  06/17/16 125 lb (56.7 kg)  06/10/16 124 lb 14.4 oz (56.7 kg)  05/05/16 124 lb (56.2 kg)   Constitutional: overweight, in NAD Eyes: PERRLA, EOMI, no exophthalmos, no lid lag, no stare ENT: moist mucous membranes, + large dense R thyroid nodule, moving with deglutition, no thyroid bruits, no cervical lymphadenopathy Cardiovascular: RRR, No MRG Respiratory: CTA B Gastrointestinal: abdomen soft, NT, ND, BS+ Musculoskeletal: no deformities, strength intact in all 4 Skin: moist, warm, no rashes Neurological: no tremor with outstretched hands, DTR normal in all 4  ASSESSMENT: 1. Thyrotoxicosis - h/o hypothyroidism  PLAN:  1. Patient with a long h/o low TSH, previously on LT4, but with persistent low TSH after decreasing the dose and eventually stopping the LT4. No thyrotoxic sxs: weight loss, hyperdefecation, palpitations, anxiety,  tremors, but has hot flushes. - she does not appear to have exogenous causes for the low TSH.  - We discussed that possible causes of thyrotoxicosis are:  Graves ds   Thyroiditis toxic multinodular goiter/ toxic adenoma (I cannot feel nodules at palpation of her thyroid). - I suggested that we check the TSH, fT3 and fT4 and also add thyroid stimulating antibodies to screen for Graves' disease.  - If the tests remain abnormal, we will need an uptake and scan to differentiate between the 3 above possible etiologies, however, she just had a contrasted CT so we need to wait at least 2 mo before checkign the Uptake and scan. We will likely need a low dose MMI (5 mg) until then. Explained possible SEs from MMI. - we discussed about possible modalities of treatment for the above conditions, to include methimazole use, radioactive iodine ablation or (last resort) surgery. - she is on beta blocker >> Bisoprolol - RTC in 3 months, but likely sooner for repeat labs  Pt needs to be called with results. Component     Latest Ref Rng & Units 06/17/2016  TSH     0.35 - 4.50 uIU/mL 0.02 (L)  T4,Free(Direct)     0.60 - 1.60 ng/dL 1.17  Triiodothyronine,Free,Serum     2.3 - 4.2 pg/mL 3.2  TSI     <140 % baseline <89   Her Graves antibodies are not elevated. Therefore, she will need an uptake and scan to clarify etiology of her thyrotoxicosis. However, we cannot do this quite yet as mentioned above, so I would suggest to start 5 mg of methimazole and recheck her labs in 1.5 months.  Samantha Pavlov, MD PhD Samantha Quinn  Endocrinology

## 2016-06-22 DIAGNOSIS — E0520 Thyrotoxicosis with toxic multinodular goiter without thyrotoxic crisis or storm: Secondary | ICD-10-CM | POA: Insufficient documentation

## 2016-06-22 LAB — THYROID STIMULATING IMMUNOGLOBULIN

## 2016-06-22 MED ORDER — METHIMAZOLE 5 MG PO TABS: 5.0000 mg | ORAL_TABLET | Freq: Every day | ORAL | 1 refills | Status: DC

## 2016-06-23 ENCOUNTER — Telehealth: Payer: Self-pay

## 2016-06-23 MED ORDER — METHIMAZOLE 5 MG PO TABS: 5.0000 mg | ORAL_TABLET | Freq: Every day | ORAL | 1 refills | Status: DC

## 2016-06-23 NOTE — Telephone Encounter (Signed)
Called patient and gave lab results. Patient had no questions or concerns. RX submitted, and lab appointment made.

## 2016-06-23 NOTE — Telephone Encounter (Signed)
-----   Message from Philemon Kingdom, MD sent at 06/22/2016  6:28 PM EST ----- Samantha Quinn, can you please call pt: Her Graves antibodies are not elevated. Therefore, she will need an uptake and scan to clarify etiology of her thyrotoxicosis. However, we cannot do this quite yet as she recently had a CT scan with contrast, so I would suggest to start 5 mg of methimazole and recheck her labs in 1.5 months. I discussed with her at last visit about this. Please send the methimazole in if she agrees. I added this to her medication list but did not send it yet.

## 2016-06-24 ENCOUNTER — Other Ambulatory Visit: Payer: Self-pay | Admitting: *Deleted

## 2016-06-24 DIAGNOSIS — C3431 Malignant neoplasm of lower lobe, right bronchus or lung: Secondary | ICD-10-CM

## 2016-06-25 ENCOUNTER — Ambulatory Visit (HOSPITAL_BASED_OUTPATIENT_CLINIC_OR_DEPARTMENT_OTHER): Payer: Medicare Other | Admitting: Hematology & Oncology

## 2016-06-25 ENCOUNTER — Other Ambulatory Visit: Payer: Medicare Other

## 2016-06-25 ENCOUNTER — Encounter: Payer: Self-pay | Admitting: Hematology & Oncology

## 2016-06-25 ENCOUNTER — Ambulatory Visit: Payer: Medicare Other | Admitting: Hematology & Oncology

## 2016-06-25 VITALS — BP 122/64 | HR 73 | Temp 98.2°F | Resp 18 | Wt 125.0 lb

## 2016-06-25 DIAGNOSIS — C3431 Malignant neoplasm of lower lobe, right bronchus or lung: Secondary | ICD-10-CM

## 2016-06-26 ENCOUNTER — Other Ambulatory Visit: Payer: Medicare Other

## 2016-06-26 NOTE — Progress Notes (Signed)
Hematology and Oncology Follow Up Visit  Samantha Quinn [id number] 11-06-1927 83 y.o. 06/26/2016   Principle Diagnosis:   Recurrent adenocarcinoma of the right lung-wild-type for EGFR/ALK  Current Therapy:    Observation     Interim History:  Samantha Quinn is in for a second opinion. She has been followed by Dr. Julien Nordmann at the main [facility]. As always, she has gotten very good care from him.  She has adenocarcinoma of the right lung. She initially had stage IB lung cancer back in March 2013. She underwent a right upper lobectomy.  She then seemed to have a recurrence. This was in August 2017. She had concurrent radiation and chemotherapy. She had low-dose carboplatinum and Taxol. This was completed and October 2017. She had a partial response.  She had a CT scan done on 412, that showed slight increase in the right central lower lobe lesion. It now measures 2.3 x 1.8 cm. Also noted is a new 7 mm nodule in the right lower lobe. She has a new right pleural effusion. No adenopathy is noted in the mediastinum or hilum.  She had lab work sent off for a lipid biopsy to see if this recurrence has any genetic driver mutations.  She comes in with her daughter. Her daughter, is a very good friend of my wife. Because of this, she was told to come to see me. Samantha Quinn actually lives fairly close to our office.  To no surprise, Dr. Julien Nordmann has been incredibly thorough. Samantha Quinn has been very appreciative of all of his care. She just feels that with her not be able to drive, it would be more convenient to be treated in our office.  Dr. Julien Nordmann did talk about immunotherapy. They are awaiting the results of the liquid biopsy.  She does have a bit of a cough. This might be somewhat better.  She's had no hemoptysis. She has had no chest wall pain. There is been no weight loss or weight gain. She's had no fever. There's been no change in bowel or bladder habits.  Overall, her  performance status is ECOG 1-2.    Medications:  Current Outpatient Prescriptions:  .  amitriptyline (ELAVIL) 10 MG tablet, Take 1 tablet (10 mg total) by mouth at bedtime as needed. (Patient taking differently: Take 5 mg by mouth at bedtime. ), Disp: 90 tablet, Rfl: 1 .  bisoprolol-hydrochlorothiazide (ZIAC) 2.5-6.25 MG tablet, Take 1 tablet by mouth daily., Disp: 90 tablet, Rfl: 3 .  Blood Glucose Monitoring Suppl (ONE TOUCH ULTRA 2) w/Device KIT, Use to check blood sugars twice a day Dx E11.9, Disp: 1 each, Rfl: 0 .  calcium carbonate (OS-CAL) 600 MG tablet, Take 600 mg by mouth daily., Disp: , Rfl:  .  gabapentin (NEURONTIN) 100 MG capsule, Take 200 mg by mouth 3 (three) times daily. , Disp: , Rfl:  .  glucose blood (ONE TOUCH ULTRA TEST) test strip, 1 each by Other route 2 (two) times daily. Use to check blood sugars twice a day Dx E11.9, Disp: 100 each, Rfl: 3 .  HYDROcodone-homatropine (HYCODAN) 5-1.5 MG/5ML syrup, Take 2.5 mLs by mouth at bedtime as needed for cough., Disp: , Rfl:  .  ibuprofen (ADVIL,MOTRIN) 200 MG tablet, Take 200 mg by mouth every 6 (six) hours as needed for fever, headache or mild pain. , Disp: , Rfl:  .  lovastatin (MEVACOR) 20 MG tablet, Take 1 tablet (20 mg total) by mouth every other day., Disp: 90 tablet,  Rfl: 1 .  metFORMIN (GLUCOPHAGE) 500 MG tablet, Take 1 tablet (500 mg total) by mouth 2 (two) times daily with a meal., Disp: 180 tablet, Rfl: 3 .  methimazole (TAPAZOLE) 5 MG tablet, Take 1 tablet (5 mg total) by mouth daily., Disp: 60 tablet, Rfl: 1 .  ONETOUCH DELICA LANCETS FINE MISC, Use one lancet to check blood sugar twice daily, Disp: 100 each, Rfl: 3 .  ramipril (ALTACE) 2.5 MG capsule, Take 1 capsule (2.5 mg total) by mouth daily., Disp: 90 capsule, Rfl: 2  Allergies:  Allergies  Allergen Reactions  . Codeine Nausea And Vomiting  . Tramadol Other (See Comments)    syncope  . Demerol Nausea Only    Past Medical History, Surgical history, Social  history, and Family History were reviewed and updated.  Review of Systems: As above  Physical Exam:  weight is 125 lb (56.7 kg). Her oral temperature is 98.2 F (36.8 C). Her blood pressure is 122/64 and her pulse is 73. Her respiration is 18 and oxygen saturation is 100%.   Wt Readings from Last 3 Encounters:  06/25/16 125 lb (56.7 kg)  06/17/16 125 lb (56.7 kg)  06/10/16 124 lb 14.4 oz (56.7 kg)     Elderly but well-nourished white female in no obvious distress. Head and neck exam shows no ocular or oral lesions. She has no palpable cervical or supraclavicular lymph nodes. Lungs show clear breath sounds bilaterally. She has good air movement bilaterally. Cardiac exam regular rate and rhythm with no murmurs, rubs or bruits. Abdomen is soft. She has decent bowel sounds. There is no fluid wave. There is no palpable liver or spleen tip. Externally shows no clubbing, cyanosis or edema. She has good range of motion of her joints. She has good strength. Skin exam shows no rashes, ecchymoses or petechia. Neurological exam shows no focal neurological deficits.  Lab Results  Component Value Date   WBC 7.1 06/08/2016   HGB 11.5 (L) 06/08/2016   HCT 35.4 06/08/2016   MCV 84.7 06/08/2016   PLT 275 06/08/2016     Chemistry      Component Value Date/Time   NA 139 06/08/2016 1316   K 3.7 06/08/2016 1316   CL 98 05/05/2016 1702   CO2 24 06/08/2016 1316   BUN 13.3 06/08/2016 1316   CREATININE 0.7 06/08/2016 1316      Component Value Date/Time   CALCIUM 9.8 06/08/2016 1316   ALKPHOS 79 06/08/2016 1316   AST 14 06/08/2016 1316   ALT 11 06/08/2016 1316   BILITOT 0.25 06/08/2016 1316         Impression and Plan: Samantha Quinn is a 83 year old white female. She has recurrent adenocarcinoma of the lung.  She has done very nicely from my point of view. Given her age, systemic chemotherapy with certainly have its toxicity. As such, I think immunotherapy would be very reasonable. If, she does  have a genetic driver mutation, this would be the best way to try to help her. However, I would think that this would be unlikely.  We talked about immunotherapy. I told her that this typically is very well tolerated and there can be some side effects. I told her about the side effects of diarrhea, skin rash, pneumonitis, hepatitis, etc.  Again, because she relies on family to drive her, she would like to be seen in our office I told she had her daughter that I would speak with Dr. Julien Nordmann about this. I am sure that he  would have no problems with me following her along.  I reassured them that they got the best care possible from Dr. Julien Nordmann. I would not have done anything different than what he has done.  We will see about getting her back to the office. We will have to see what the liquid biopsies show.  I spent about 45 minutes with she and her daughter. I answered all of their questions.   [name], MD 3/2/20187:34 AM

## 2016-06-29 ENCOUNTER — Encounter: Payer: Self-pay | Admitting: Internal Medicine

## 2016-06-29 ENCOUNTER — Ambulatory Visit (HOSPITAL_BASED_OUTPATIENT_CLINIC_OR_DEPARTMENT_OTHER): Payer: Medicare Other | Admitting: Internal Medicine

## 2016-06-29 VITALS — BP 143/66 | HR 81 | Temp 98.2°F | Resp 17 | Ht 60.5 in | Wt 126.3 lb

## 2016-06-29 DIAGNOSIS — I10 Essential (primary) hypertension: Secondary | ICD-10-CM

## 2016-06-29 DIAGNOSIS — C3431 Malignant neoplasm of lower lobe, right bronchus or lung: Secondary | ICD-10-CM

## 2016-06-29 DIAGNOSIS — R05 Cough: Secondary | ICD-10-CM | POA: Diagnosis not present

## 2016-06-29 DIAGNOSIS — R059 Cough, unspecified: Secondary | ICD-10-CM

## 2016-06-29 NOTE — Progress Notes (Signed)
    [facility] [phone]   [phone]  OFFICE PROGRESS NOTE  Stacy J Burns, MD [address]  [place] [address]  DIAGNOSIS: Recurrent non-small cell lung cancer presented with unresectable a stage II a (T1a, N1, M0) non-small cell lung cancer, adenocarcinoma presented with right perihilar lower lobe soft tissue mass with right hilar adenopathy diagnosed in August 2017. The patient has a history of stage Ib non-small cell lung cancer diagnosed in March 2013.  [facility]: Positive only for TP53 and JAK2 (V617F) mutation.   PRIOR THERAPY: 1) status post right upper lobectomy with lymph node dissection in March 2013 and the tumor was negative for EGFR and ALK mutations. 2) concurrent chemoradiation with weekly carboplatin for AUC of 2 and paclitaxel 45 MG/M2, status post 6 cycles last dose was given 02/10/2016 with partial response.  CURRENT THERAPY: Observation.  INTERVAL HISTORY: Samantha Quinn 83 y.o. female came to the clinic today for follow-up visit accompanied by her daughter. The patient is feeling better today with no specific complaints except for the persistent dry cough worse at night time. She takes Hycodan mainly at nighttime and rarely during the day. She denied having any chest pain, shortness of breath, cough or hemoptysis. She has no nausea or vomiting. She denied having any significant weight loss or night sweats. She has no headache or visual changes. She had blood test sent to [facility] [employer] recently for molecular studies and she is here today for evaluation and discussion of her treatment options based on the molecular test.  MEDICAL HISTORY: Past Medical History:  Diagnosis Date  . Back pain    bulding disc  . Bronchitis    hx of-many,many years   . Diabetes mellitus   . Diabetes mellitus    type 2 and takes Metformin bid  . Dyslipidemia   . Embolism - blood clot    hx of in left lower leg and was on Coumadin for about  6months;this was about 8-10yrs ago  . Family history of adverse reaction to anesthesia    Daughter- Nausea  . History of colonic polyps   . HOH (hard of hearing)   . Hx of migraines    as a teenager  . Hypercholesterolemia   . Hyperlipidemia    takes Lovastatin every other day  . Hypertension    takes Ziac daily and Ramipril as well  . Hypothyroidism    takes Synthroid every other day  . Insomnia    takes Elavil nightly  . Joint pain   . Lung mass   . Myalgia   . Osteoarthritis   . Osteoporosis   . Pneumothorax of right lung after biopsy 07/09/11   HAD CHEST TUBE PLACEMENT  . PONV (postoperative nausea and vomiting)   . Primary adenocarcinoma of lower lobe of right lung (HCC) 06/24/2011   pT2a, pN0 M0 Stage 1 Well differenced Adenocarcinoma 3.5 cm resected 07/17/2011  . Primary adenocarcinoma of lung (HCC) 06/24/2011  . Shortness of breath dyspnea    With exertion  . Skin cancer    legs have dark spots on them  . Upper respiratory infection 04/2011  . Urinary incontinence    takes Detrol daily prn    ALLERGIES:  is allergic to codeine; tramadol; and demerol.  MEDICATIONS:  Current Outpatient Prescriptions  Medication Sig Dispense Refill  . amitriptyline (ELAVIL) 10 MG tablet Take 1 tablet (10 mg total) by mouth at bedtime as needed. (Patient taking differently: Take   5 mg by mouth at bedtime. ) 90 tablet 1  . bisoprolol-hydrochlorothiazide (ZIAC) 2.5-6.25 MG tablet Take 1 tablet by mouth daily. 90 tablet 3  . Blood Glucose Monitoring Suppl (ONE TOUCH ULTRA 2) w/Device KIT Use to check blood sugars twice a day Dx E11.9 1 each 0  . calcium carbonate (OS-CAL) 600 MG tablet Take 600 mg by mouth daily.    . gabapentin (NEURONTIN) 100 MG capsule Take 200 mg by mouth 3 (three) times daily.     . glucose blood (ONE TOUCH ULTRA TEST) test strip 1 each by Other route 2 (two) times daily. Use to check blood sugars twice a day Dx E11.9 100 each 3  . HYDROcodone-homatropine (HYCODAN)  5-1.5 MG/5ML syrup Take 2.5 mLs by mouth at bedtime as needed for cough.    . ibuprofen (ADVIL,MOTRIN) 200 MG tablet Take 200 mg by mouth every 6 (six) hours as needed for fever, headache or mild pain.     . lovastatin (MEVACOR) 20 MG tablet Take 1 tablet (20 mg total) by mouth every other day. 90 tablet 1  . metFORMIN (GLUCOPHAGE) 500 MG tablet Take 1 tablet (500 mg total) by mouth 2 (two) times daily with a meal. 180 tablet 3  . methimazole (TAPAZOLE) 5 MG tablet Take 1 tablet (5 mg total) by mouth daily. 60 tablet 1  . ONETOUCH DELICA LANCETS FINE MISC Use one lancet to check blood sugar twice daily 100 each 3  . ramipril (ALTACE) 2.5 MG capsule Take 1 capsule (2.5 mg total) by mouth daily. 90 capsule 2   No current facility-administered medications for this visit.     SURGICAL HISTORY:  Past Surgical History:  Procedure Laterality Date  . ABDOMINAL HYSTERECTOMY    . APPENDECTOMY     as a child  . CHEST TUBE REMOVAL  07/13/11   RIGHT  CHEST TUBE  . CHOLECYSTECTOMY    . COLONOSCOPY    . COLONOSCOPY    . DILATION AND CURETTAGE OF UTERUS     x 2  . EYE SURGERY Bilateral    Cataract  . HEMORRHOID SURGERY    . LUNG BIOPSY  07/09/11   RIGHT UPER LOBE LUNG MASS   . RIGHT CHEST TUBE PLACEMENT  07/10/11   S/P LUNG BX  . ROTATOR CUFF REPAIR     bilateral  . TOE SURGERY  2013   right small toe  . TONSILLECTOMY     as a child  . VIDEO BRONCHOSCOPY  07/17/2011   Procedure: VIDEO BRONCHOSCOPY;  Surgeon: Edward B Gerhardt, MD;  Location: [facility] OR;  Service: Thoracic;  Laterality: N/A;  . VIDEO BRONCHOSCOPY WITH ENDOBRONCHIAL ULTRASOUND N/A 12/10/2015   Procedure: VIDEO BRONCHOSCOPY WITH ENDOBRONCHIAL ULTRASOUND with biopsies.;  Surgeon: Edward B Gerhardt, MD;  Location: [facility] OR;  Service: Thoracic;  Laterality: N/A;    REVIEW OF SYSTEMS:  Constitutional: negative Eyes: negative Ears, nose, mouth, throat, and face: negative Respiratory: positive for cough Cardiovascular:  negative Gastrointestinal: negative Genitourinary:negative Integument/breast: negative Hematologic/lymphatic: negative Musculoskeletal:negative Neurological: negative Behavioral/Psych: negative Endocrine: negative Allergic/Immunologic: negative   PHYSICAL EXAMINATION: General appearance: alert, cooperative and no distress Head: Normocephalic, without obvious abnormality, atraumatic Neck: no adenopathy, no JVD, supple, symmetrical, trachea midline and thyroid not enlarged, symmetric, no tenderness/mass/nodules Lymph nodes: Cervical, supraclavicular, and axillary nodes normal. Resp: clear to auscultation bilaterally Back: symmetric, no curvature. ROM normal. No CVA tenderness. Cardio: regular rate and rhythm, S1, S2 normal, no murmur, click, rub or gallop GI: soft, non-tender; bowel sounds normal;   no masses,  no organomegaly Extremities: extremities normal, atraumatic, no cyanosis or edema Neurologic: Alert and oriented X 3, normal strength and tone. Normal symmetric reflexes. Normal coordination and gait  ECOG PERFORMANCE STATUS: 1 - Symptomatic but completely ambulatory  Blood pressure (!) 143/66, pulse 81, temperature 98.2 F (36.8 C), temperature source Oral, resp. rate 17, height 5' 0.5" (1.537 m), weight 126 lb 4.8 oz (57.3 kg), SpO2 95 %.  LABORATORY DATA: Lab Results  Component Value Date   WBC 7.1 06/08/2016   HGB 11.5 (L) 06/08/2016   HCT 35.4 06/08/2016   MCV 84.7 06/08/2016   PLT 275 06/08/2016      Chemistry      Component Value Date/Time   NA 139 06/08/2016 1316   K 3.7 06/08/2016 1316   CL 98 05/05/2016 1702   CO2 24 06/08/2016 1316   BUN 13.3 06/08/2016 1316   CREATININE 0.7 06/08/2016 1316      Component Value Date/Time   CALCIUM 9.8 06/08/2016 1316   ALKPHOS 79 06/08/2016 1316   AST 14 06/08/2016 1316   ALT 11 06/08/2016 1316   BILITOT 0.25 06/08/2016 1316       RADIOGRAPHIC STUDIES: Ct Chest W Contrast  Result Date: 06/09/2016 CLINICAL  DATA:  Restaging lung cancer EXAM: CT CHEST WITH CONTRAST TECHNIQUE: Multidetector CT imaging of the chest was performed during intravenous contrast administration. CONTRAST:  42m ISOVUE-300 IOPAMIDOL (ISOVUE-300) INJECTION 61% COMPARISON:  03/26/2016 FINDINGS: Cardiovascular: The heart size appears within normal limits. There is no pericardial effusion. Aortic atherosclerosis. Mediastinum/Nodes: The trachea appears patent and is midline. Normal appearance of the esophagus. No mediastinal adenopathy. No hilar adenopathy identified. Lungs/Pleura: Small right pleural effusion is new from previous exam. There are postsurgical changes in volume loss from a right upper lobectomy. Progressive changes of external beam radiation identified involving the right mid and upper lung zones. The soft tissue scratch set index lesion in the right upper lung is un described on most recent previous exam is obscured by changes of external beam radiation including consolidation and fibrosis. The index measurement within the right lower lung measures 2.3 x 1.8 cm, image 69 of series 5. Previously this measured 2.0 x 1.6 cm. The there is a new nodule within the right lower lobe which measures 7 mm, image 82 of series 5. Suspicious. Stable left lower lobe lung nodule measuring 5 mm, image 78 of series 5. Upper Abdomen: No acute findings identified. Large peripherally calcified cyst within the spleen is unchanged. Prior cholecystectomy. Musculoskeletal: There is no aggressive lytic or sclerotic bone lesions identified. IMPRESSION: 1. Slight increase in size of index lesion within the central right lower lobe, which may reflect progression of disease. Additionally, there is a new nodule within the periphery of the right lower lobe which measures 7 mm and is worrisome for metastasis. There is also a new right pleural effusion. 2. Progressive changes of external beam radiation within the right upper lung zone which obscures the index lesion  within this area. 3. Aortic atherosclerosis. Electronically Signed   By: TKerby MoorsM.D.   On: 06/09/2016 08:54    ASSESSMENT AND PLAN:  This is a very pleasant 83years old white female with recurrent non-small cell lung cancer, adenocarcinoma initially diagnosed as unresectable a stage II a with right perihilar lower lobe nodule in addition to right hilar adenopathy, status post concurrent chemoradiation with weekly carboplatin and paclitaxel. Molecular studies were negative for EGFR, ALK, ROS 1 and BRAF mutations.  [facility]  molecular studies was positive only for TP 53 and JAK2 mutations.  The patient has been on observation for the last few months. Her most recent CT scan of the chest showed evidence for mild disease progression in the right perihilar mass as well as new small pulmonary nodules in the left and right lung. I had a lengthy discussion with the patient and her daughter about the recent molecular studies and treatment options. I gave the patient the option of treatment with immunotherapy with either Nivolumab or Tecentriq (Atezolizumab) versus continuous observation and close monitoring with repeat CT scan of the chest in 2 months for reevaluation of her disease and to see if there is any further disease progression.  The patient was seen recently by Dr. Marin Olp at [facility] for a second opinion and also to receive treatment close to home. I advised her to keep her appointment with him for more detailed discussion of her treatment options and also to start treatment if needed close to home. I will be happy to see her in the future if needed but I think Dr. Marin Olp will provide her with great care. For hypertension, the patient will continue her current treatment with Ramipril and Ziac but she may need to discuss with her primary care physician changing ramipril a different class because of the persistent cough. For cough, she will continue on Vicodin when  necessary. The patient voices understanding of current disease status and treatment options and is in agreement with the current care plan. All questions were answered. The patient knows to call the clinic with any problems, questions or concerns. We can certainly see the patient much sooner if necessary.  Disclaimer: This note was dictated with voice recognition software. Similar sounding words can inadvertently be transcribed and may not be corrected upon review.

## 2016-07-02 ENCOUNTER — Ambulatory Visit (HOSPITAL_BASED_OUTPATIENT_CLINIC_OR_DEPARTMENT_OTHER): Payer: Medicare Other | Admitting: Hematology & Oncology

## 2016-07-02 VITALS — BP 135/70 | HR 85 | Temp 98.0°F | Resp 17

## 2016-07-02 DIAGNOSIS — R05 Cough: Secondary | ICD-10-CM

## 2016-07-02 DIAGNOSIS — C3431 Malignant neoplasm of lower lobe, right bronchus or lung: Secondary | ICD-10-CM | POA: Diagnosis not present

## 2016-07-02 NOTE — Progress Notes (Signed)
Samantha Quinn is      [facility] Telephone:([phone]) [phone]   Fax:(336) [phone]  OFFICE PROGRESS NOTE  Binnie Rail, MD [place] [address]  DIAGNOSIS: Recurrent non-small cell lung cancer presented with unresectable a stage II a (T1a, N1, M0) non-small cell lung cancer, adenocarcinoma presented with right perihilar lower lobe soft tissue mass with right hilar adenopathy diagnosed in August 2017. The patient has a history of stage Ib non-small cell lung cancer diagnosed in March 2013.  [facility]: Positive only for TP53 and JAK2 (V617F) mutation.   PRIOR THERAPY: Samantha Quinn) status post right upper lobectomy with lymph node dissection in March 2013 and the tumor was negative for EGFR and ALK mutations. 2) concurrent chemoradiation with weekly carboplatin for AUC of 2 and paclitaxel 45 MG/M2, status post 6 cycles last dose was given 02/10/2016 with partial response.  CURRENT THERAPY: Observation.  INTERVAL HISTORY: Samantha Quinn 83 y.o. female came to the clinic today for follow-up visit accompanied by her daughter. The patient is feeling better today with no specific complaints except for the persistent dry cough worse at night time. She takes Hycodan mainly at nighttime and rarely during the day. She denied having any chest pain, shortness of breath, cough or hemoptysis. She has no nausea or vomiting. She denied having any significant weight loss or night sweats. She has no headache or visual changes. She had blood test sent to [facility] recently for molecular studies and she is here today for evaluation and discussion of her treatment options based on the molecular test.  MEDICAL HISTORY: Past Medical History:  Diagnosis Date  . Back pain    bulding disc  . Bronchitis    hx of-many,many years   . Diabetes mellitus   . Diabetes mellitus    type 2 and takes Metformin bid  . Dyslipidemia   . Embolism - blood clot    hx of in left lower leg and was on Coumadin for  about 68month;this was about 8-151yrago  . Family history of adverse reaction to anesthesia    Daughter- Nausea  . History of colonic polyps   . HOH (hard of hearing)   . Hx of migraines    as a teenager  . Hypercholesterolemia   . Hyperlipidemia    takes Lovastatin every other day  . Hypertension    takes Ziac daily and Ramipril as well  . Hypothyroidism    takes Synthroid every other day  . Insomnia    takes Elavil nightly  . Joint pain   . Lung mass   . Myalgia   . Osteoarthritis   . Osteoporosis   . Pneumothorax of right lung after biopsy 07/09/11   HAD CHEST TUBE PLACEMENT  . PONV (postoperative nausea and vomiting)   . Primary adenocarcinoma of lower lobe of right lung ([facility]/27/2013   pT2a, pN0 M0 Stage Samantha Quinn Well differenced Adenocarcinoma 3.5 cm resected 07/17/2011  . Primary adenocarcinoma of lung ([facility]2/27/2013  . Shortness of breath dyspnea    With exertion  . Skin cancer    legs have dark spots on them  . Upper respiratory infection Samantha Quinn/2013  . Urinary incontinence    takes Detrol daily prn    ALLERGIES:  is allergic to codeine; tramadol; and demerol.  MEDICATIONS:  Current Outpatient Prescriptions  Medication Sig Dispense Refill  . amitriptyline (ELAVIL) 10 MG tablet Take Samantha Quinn tablet (10 mg total) by mouth at bedtime as needed. (Patient  taking differently: Take 5 mg by mouth at bedtime. ) 90 tablet Samantha Quinn  . bisoprolol-hydrochlorothiazide (ZIAC) 2.5-6.25 MG tablet Take Samantha Quinn tablet by mouth daily. 90 tablet 3  . Blood Glucose Monitoring Suppl (ONE TOUCH ULTRA 2) w/Device KIT Use to check blood sugars twice a day Dx E11.9 Samantha Quinn each 0  . calcium carbonate (OS-CAL) 600 MG tablet Take 600 mg by mouth daily.    [name] gabapentin (NEURONTIN) 100 MG capsule Take 200 mg by mouth 3 (three) times daily.     [name] glucose blood (ONE TOUCH ULTRA TEST) test strip Samantha Quinn each by Other route 2 (two) times daily. Use to check blood sugars twice a day Dx E11.9 100 each 3  . HYDROcodone-homatropine  (HYCODAN) 5-Samantha Quinn.5 MG/5ML syrup Take 2.5 mLs by mouth at bedtime as needed for cough.    [name] ibuprofen (ADVIL,MOTRIN) 200 MG tablet Take 200 mg by mouth every 6 (six) hours as needed for fever, headache or mild pain.     [name] lovastatin (MEVACOR) 20 MG tablet Take Samantha Quinn tablet (20 mg total) by mouth every other day. 90 tablet Samantha Quinn  . metFORMIN (GLUCOPHAGE) 500 MG tablet Take Samantha Quinn tablet (500 mg total) by mouth 2 (two) times daily with a meal. 180 tablet 3  . methimazole (TAPAZOLE) 5 MG tablet Take Samantha Quinn tablet (5 mg total) by mouth daily. 60 tablet Samantha Quinn  . ONETOUCH DELICA LANCETS FINE MISC Use one lancet to check blood sugar twice daily 100 each 3  . ramipril (ALTACE) 2.5 MG capsule Take Samantha Quinn capsule (2.5 mg total) by mouth daily. 90 capsule 2   No current facility-administered medications for this visit.     SURGICAL HISTORY:  Past Surgical History:  Procedure Laterality Date  . ABDOMINAL HYSTERECTOMY    . APPENDECTOMY     as a child  . CHEST TUBE REMOVAL  07/13/11   RIGHT  CHEST TUBE  . CHOLECYSTECTOMY    . COLONOSCOPY    . COLONOSCOPY    . DILATION AND CURETTAGE OF UTERUS     x 2  . EYE SURGERY Bilateral    Cataract  . HEMORRHOID SURGERY    . LUNG BIOPSY  07/09/11   RIGHT UPER LOBE LUNG MASS   . RIGHT CHEST TUBE PLACEMENT  07/10/11   S/P LUNG BX  . ROTATOR CUFF REPAIR     bilateral  . TOE SURGERY  2013   right small toe  . TONSILLECTOMY     as a child  . VIDEO BRONCHOSCOPY  07/17/2011   Procedure: VIDEO BRONCHOSCOPY;  Surgeon: Grace Isaac, MD;  Location: [facility];  Service: Thoracic;  Laterality: N/A;  . VIDEO BRONCHOSCOPY WITH ENDOBRONCHIAL ULTRASOUND N/A 12/10/2015   Procedure: VIDEO BRONCHOSCOPY WITH ENDOBRONCHIAL ULTRASOUND with biopsies.;  Surgeon: Grace Isaac, MD;  Location: [facility];  Service: Thoracic;  Laterality: N/A;    REVIEW OF SYSTEMS:  Constitutional: negative Eyes: negative Ears, nose, mouth, throat, and face: negative Respiratory: positive for cough Cardiovascular:  negative Gastrointestinal: negative Genitourinary:negative Integument/breast: negative Hematologic/lymphatic: negative Musculoskeletal:negative Neurological: negative Behavioral/Psych: negative Endocrine: negative Allergic/Immunologic: negative   PHYSICAL EXAMINATION: General appearance: alert, cooperative and no distress Head: Normocephalic, without obvious abnormality, atraumatic Neck: no adenopathy, no JVD, supple, symmetrical, trachea midline and thyroid not enlarged, symmetric, no tenderness/mass/nodules Lymph nodes: Cervical, supraclavicular, and axillary nodes normal. Resp: clear to auscultation bilaterally Back: symmetric, no curvature. ROM normal. No CVA tenderness. Cardio: regular rate and rhythm, S1, S2 normal, no murmur, click, rub or gallop GI: soft, non-tender;  bowel sounds normal; no masses,  no organomegaly Extremities: extremities normal, atraumatic, no cyanosis or edema Neurologic: Alert and oriented X 3, normal strength and tone. Normal symmetric reflexes. Normal coordination and gait  ECOG PERFORMANCE STATUS: Samantha Quinn - Symptomatic but completely ambulatory  Blood pressure 135/70, pulse 85, temperature 98 F (36.7 C), temperature source Oral, resp. rate 17, SpO2 100 %.  LABORATORY DATA: Lab Results  Component Value Date   WBC 7.Samantha Quinn 06/08/2016   HGB 11.5 (L) 06/08/2016   HCT 35.4 06/08/2016   MCV 84.7 06/08/2016   PLT 275 06/08/2016      Chemistry      Component Value Date/Time   NA 139 06/08/2016 1316   K 3.7 06/08/2016 1316   CL 98 05/05/2016 1702   CO2 24 06/08/2016 1316   BUN 13.3 06/08/2016 1316   CREATININE 0.7 06/08/2016 1316      Component Value Date/Time   CALCIUM 9.8 06/08/2016 1316   ALKPHOS 79 06/08/2016 1316   AST 14 06/08/2016 1316   ALT 11 06/08/2016 1316   BILITOT 0.25 06/08/2016 1316       RADIOGRAPHIC STUDIES: Ct Chest W Contrast  Result Date: 06/09/2016 CLINICAL DATA:  Restaging lung cancer EXAM: CT CHEST WITH CONTRAST  TECHNIQUE: Multidetector CT imaging of the chest was performed during intravenous contrast administration. CONTRAST:  81m ISOVUE-300 IOPAMIDOL (ISOVUE-300) INJECTION 61% COMPARISON:  03/26/2016 FINDINGS: Cardiovascular: The heart size appears within normal limits. There is no pericardial effusion. Aortic atherosclerosis. Mediastinum/Nodes: The trachea appears patent and is midline. Normal appearance of the esophagus. No mediastinal adenopathy. No hilar adenopathy identified. Lungs/Pleura: Small right pleural effusion is new from previous exam. There are postsurgical changes in volume loss from a right upper lobectomy. Progressive changes of external beam radiation identified involving the right mid and upper lung zones. The soft tissue scratch set index lesion in the right upper lung is un described on most recent previous exam is obscured by changes of external beam radiation including consolidation and fibrosis. The index measurement within the right lower lung measures 2.3 x Samantha Quinn.8 cm, image 69 of series 5. Previously this measured 2.0 x Samantha Quinn.6 cm. The there is a new nodule within the right lower lobe which measures 7 mm, image 82 of series 5. Suspicious. Stable left lower lobe lung nodule measuring 5 mm, image 78 of series 5. Upper Abdomen: No acute findings identified. Large peripherally calcified cyst within the spleen is unchanged. Prior cholecystectomy. Musculoskeletal: There is no aggressive lytic or sclerotic bone lesions identified. IMPRESSION: Samantha Quinn. Slight increase in size of index lesion within the central right lower lobe, which may reflect progression of disease. Additionally, there is a new nodule within the periphery of the right lower lobe which measures 7 mm and is worrisome for metastasis. There is also a new right pleural effusion. 2. Progressive changes of external beam radiation within the right upper lung zone which obscures the index lesion within this area. 3. Aortic atherosclerosis. Electronically  Signed   By: TKerby MoorsM.D.   On: 06/09/2016 08:54    ASSESSMENT AND PLAN:  This is a very pleasant 83years old white female with recurrent non-small cell lung cancer, adenocarcinoma initially diagnosed as unresectable a stage II a with right perihilar lower lobe nodule in addition to right hilar adenopathy, status post concurrent chemoradiation with weekly carboplatin and paclitaxel. Molecular studies were negative for EGFR, ALK, ROS Samantha Quinn and BRAF mutations.  [facility] molecular studies was positive only for TP 53 and JAK2  mutations.  The patient has been on observation for the last few months. Her most recent CT scan of the chest showed evidence for mild disease progression in the right perihilar mass as well as new small pulmonary nodules in the left and right lung. I had a lengthy discussion with the patient and her daughter about the recent molecular studies and treatment options. I gave the patient the option of treatment with immunotherapy with either Nivolumab or Tecentriq (Atezolizumab) versus continuous observation and close monitoring with repeat CT scan of the chest in 2 months for reevaluation of her disease and to see if there is any further disease progression.  The patient was seen recently by Dr. Marin Olp at the [facility] for a second opinion and also to receive treatment close to home. I advised her to keep her appointment with him for more detailed discussion of her treatment options and also to start treatment if needed close to home. I will be happy to see her in the future if needed but I think Dr. Marin Olp will provide her with great care. For hypertension, the patient will continue her current treatment with Ramipril and Ziac but she may need to discuss with her primary care physician changing ramipril a different class because of the persistent cough. For cough, she will continue on Vicodin when necessary. The patient voices understanding of current disease  status and treatment options and is in agreement with the current care plan. All questions were answered. The patient knows to call the clinic with any problems, questions or concerns. We can certainly see the patient much sooner if necessary.  Disclaimer: This note was dictated with voice recognition software. Similar sounding words can inadvertently be transcribed and may not be corrected upon review.

## 2016-07-02 NOTE — Progress Notes (Signed)
DISCONTINUE ON PATHWAY REGIMEN - Non-Small Cell Lung     Administer weekly:     Paclitaxel        Dose Mod: None     Carboplatin        Dose Mod: None  **Always confirm dose/schedule in your pharmacy ordering system**    REASON: Disease Progression PRIOR TREATMENT: [id number]: Carboplatin AUC=2 + Paclitaxel 45 mg/m2 Weekly During Radiation TREATMENT RESPONSE: Partial Response (PR)  START ON PATHWAY REGIMEN - Non-Small Cell Lung     A cycle is every 21 days:     Atezolizumab   **Always confirm dose/schedule in your pharmacy ordering system**    Patient Characteristics: Stage IV Metastatic, Non Squamous, Second Line - Chemotherapy/Immunotherapy, PS = 2, No Prior PD-1/PD-L1  Inhibitor and Immunotherapy Candidate AJCC T Category: T2a Current Disease Status: Distant Metastases AJCC N Category: N2 AJCC M Category: M1a AJCC 8 Stage Grouping: IVA Histology: Non Squamous Cell ROS1 Rearrangement Status: Negative T790M Mutation Status: Not Applicable - EGFR Mutation Negative/Unknown Other Mutations/Biomarkers: No Other Actionable Mutations PD-L1 Expression Status: PD-L1 Positive 1-49% (TPS) Chemotherapy/Immunotherapy LOT: Second Line Chemotherapy/Immunotherapy Molecular Targeted Therapy: Not Appropriate ALK Translocation Status: Negative Would you be surprised if this patient died  in the next year? I would be surprised if this patient died in the next year EGFR Mutation Status: Negative/Wild Type BRAF V600E Mutation Status: Negative Performance Status: PS = 2 Immunotherapy Candidate Status: Candidate for Immunotherapy Prior Immunotherapy Status: No Prior PD-1/PD-L1 Inhibitor  Intent of Therapy: Non-Curative / Palliative Intent, Discussed with Patient 

## 2016-07-02 NOTE — Progress Notes (Signed)
Hematology and Oncology Follow Up Visit  Samantha Quinn [id number] 1928/04/05 83 y.o. 07/02/2016   Principle Diagnosis:   Stage IV adenocarcinoma of the right lung-mutation negative  Current Therapy:    Tecentriq 1200 mg IV every 3 weeks-first cycle to start on 07/14/2016  Status post radiation/chemotherapy for recurrence-completed October 2017  Status post right upper lobectomy in March 2013 for stage IB disease     Interim History:  Samantha Quinn is back for follow-up. Unfortunately, she did have genetic studies done via a liquid biopsy. She did not have any actionable mutations that could be targeted.  She saw Dr. Julien Nordmann a couple days ago. He recommended either close surveillance or immunotherapy.  Samantha Quinn [place] to the [place]. As such, she wished to transfer her care to our office. I asked for Dr. Worthy Flank blessing which he kindly gave [place].  Samantha Quinn is feeling pretty well. She has a cough. She has no productive sputum.  She's had no fever. She's had no hemoptysis.  Her appetite is good. She's had no nausea or vomiting.  His been no change in bowel or bladder habits. Patient is had no leg swelling. She's had no rashes.  Overall, her performance status is ECOG 1-2.  Medications:  Current Outpatient Prescriptions:  .  amitriptyline (ELAVIL) 10 MG tablet, Take 1 tablet (10 mg total) by mouth at bedtime as needed. (Patient taking differently: Take 5 mg by mouth at bedtime. ), Disp: 90 tablet, Rfl: 1 .  bisoprolol-hydrochlorothiazide (ZIAC) 2.5-6.25 MG tablet, Take 1 tablet by mouth daily., Disp: 90 tablet, Rfl: 3 .  Blood Glucose Monitoring Suppl (ONE TOUCH ULTRA 2) w/Device KIT, Use to check blood sugars twice a day Dx E11.9, Disp: 1 each, Rfl: 0 .  calcium carbonate (OS-CAL) 600 MG tablet, Take 600 mg by mouth daily., Disp: , Rfl:  .  gabapentin (NEURONTIN) 100 MG capsule, Take 200 mg by mouth 3 (three) times daily. , Disp: , Rfl:  .  glucose  blood (ONE TOUCH ULTRA TEST) test strip, 1 each by Other route 2 (two) times daily. Use to check blood sugars twice a day Dx E11.9, Disp: 100 each, Rfl: 3 .  HYDROcodone-homatropine (HYCODAN) 5-1.5 MG/5ML syrup, Take 2.5 mLs by mouth at bedtime as needed for cough., Disp: , Rfl:  .  ibuprofen (ADVIL,MOTRIN) 200 MG tablet, Take 200 mg by mouth every 6 (six) hours as needed for fever, headache or mild pain. , Disp: , Rfl:  .  lovastatin (MEVACOR) 20 MG tablet, Take 1 tablet (20 mg total) by mouth every other day., Disp: 90 tablet, Rfl: 1 .  metFORMIN (GLUCOPHAGE) 500 MG tablet, Take 1 tablet (500 mg total) by mouth 2 (two) times daily with a meal., Disp: 180 tablet, Rfl: 3 .  methimazole (TAPAZOLE) 5 MG tablet, Take 1 tablet (5 mg total) by mouth daily., Disp: 60 tablet, Rfl: 1 .  ONETOUCH DELICA LANCETS FINE MISC, Use one lancet to check blood sugar twice daily, Disp: 100 each, Rfl: 3 .  ramipril (ALTACE) 2.5 MG capsule, Take 1 capsule (2.5 mg total) by mouth daily., Disp: 90 capsule, Rfl: 2  Allergies:  Allergies  Allergen Reactions  . Codeine Nausea And Vomiting  . Tramadol Other (See Comments)    syncope  . Demerol Nausea Only    Past Medical History, Surgical history, Social history, and Family History were reviewed and updated.  Review of Systems: As above  Physical Exam:  oral temperature is 98  F (36.7 C). Her blood pressure is 135/70 and her pulse is 85. Her respiration is 17 and oxygen saturation is 100%.   Wt Readings from Last 3 Encounters:  06/29/16 126 lb 4.8 oz (57.3 kg)  06/25/16 125 lb (56.7 kg)  06/17/16 125 lb (56.7 kg)     Elderly but well-nourished white female in no obvious distress. Head and neck exam shows no ocular or oral lesions. She has no palpable cervical or supraclavicular lymph nodes. Lungs show clear breath sounds bilaterally. She has good air movement bilaterally. Cardiac exam regular rate and rhythm with no murmurs, rubs or bruits. Abdomen is soft.  She has decent bowel sounds. There is no fluid wave. There is no palpable liver or spleen tip. Externally shows no clubbing, cyanosis or edema. She has good range of motion of her joints. She has good strength. Skin exam shows no rashes, ecchymoses or petechia. Neurological exam shows no focal neurological deficits.  Lab Results  Component Value Date   WBC 7.1 06/08/2016   HGB 11.5 (L) 06/08/2016   HCT 35.4 06/08/2016   MCV 84.7 06/08/2016   PLT 275 06/08/2016     Chemistry      Component Value Date/Time   NA 139 06/08/2016 1316   K 3.7 06/08/2016 1316   CL 98 05/05/2016 1702   CO2 24 06/08/2016 1316   BUN 13.3 06/08/2016 1316   CREATININE 0.7 06/08/2016 1316      Component Value Date/Time   CALCIUM 9.8 06/08/2016 1316   ALKPHOS 79 06/08/2016 1316   AST 14 06/08/2016 1316   ALT 11 06/08/2016 1316   BILITOT 0.25 06/08/2016 1316         Impression and Plan: Samantha Quinn is a 83 year old white female. She has recurrent non-small cell lung cancer. This appears to be adenocarcinoma. She does not have any mutations that we can target.  Given her performance status, I think that the use of immunotherapy would be highly reasonable. I think she could tolerate this. I think that there would be about a 25-35% chance of seeing some improvement.  I would be worried that if this tumor increased in size, it would lead to obstruction of her right lung. I think if this happened, this vessel would decline very quickly.  I talked to Ms. Stephanie and her daughter for about 45 minutes. I explained what immunotherapy was. I explained how it worked. I went over the side effects. I explained that we have to watch out for diarrhea, hepatic issues, pneumonitis, skin rashes, thyroid abnormalities, possibly neurological issues. I think the chance of these happening probably would be less than 10%.  I would give her 4 cycles of treatment and then repeat her scan to see how things looked.  She is in  agreement. We will proceed in the next week or so. There are she does not need a Port-A-Cath. She has fairly decent peripheral access.   [name], MD 3/8/20184:47 PM

## 2016-07-07 DIAGNOSIS — M259 Joint disorder, unspecified: Secondary | ICD-10-CM | POA: Diagnosis not present

## 2016-07-07 DIAGNOSIS — M5126 Other intervertebral disc displacement, lumbar region: Secondary | ICD-10-CM | POA: Diagnosis not present

## 2016-07-14 ENCOUNTER — Ambulatory Visit (HOSPITAL_BASED_OUTPATIENT_CLINIC_OR_DEPARTMENT_OTHER): Payer: Medicare Other

## 2016-07-14 VITALS — BP 134/70 | HR 81 | Temp 98.7°F

## 2016-07-14 DIAGNOSIS — C3431 Malignant neoplasm of lower lobe, right bronchus or lung: Secondary | ICD-10-CM

## 2016-07-14 DIAGNOSIS — Z5112 Encounter for antineoplastic immunotherapy: Secondary | ICD-10-CM

## 2016-07-14 MED ORDER — SODIUM CHLORIDE 0.9 % IV SOLN
1200.0000 mg | Freq: Once | INTRAVENOUS | Status: AC
  Administered 2016-07-14: 1200 mg via INTRAVENOUS
  Filled 2016-07-14: qty 20

## 2016-07-14 MED ORDER — SODIUM CHLORIDE 0.9 % IV SOLN
Freq: Once | INTRAVENOUS | Status: AC
  Administered 2016-07-14: 15:00:00 via INTRAVENOUS

## 2016-07-14 NOTE — Patient Instructions (Signed)
Atezolizumab injection What is this medicine? ATEZOLIZUMAB (a te zoe LIZ ue mab) is a monoclonal antibody. It is used to treat bladder cancer (urothelial cancer) and non-small cell lung cancer. This medicine may be used for other purposes; ask your health care provider or pharmacist if you have questions. COMMON BRAND NAME(S): Tecentriq What should I tell my health care provider before I take this medicine? They need to know if you have any of these conditions: -diabetes -immune system problems -infection -inflammatory bowel disease -liver disease -lung or breathing disease -lupus -nervous system problems like myasthenia gravis or Guillain-Barre syndrome -organ transplant -an unusual or allergic reaction to atezolizumab, other medicines, foods, dyes, or preservatives -pregnant or trying to get pregnant -breast-feeding How should I use this medicine? This medicine is for infusion into a vein. It is given by a health care professional in a hospital or clinic setting. A special MedGuide will be given to you before each treatment. Be sure to read this information carefully each time. Talk to your pediatrician regarding the use of this medicine in children. Special care may be needed. Overdosage: If you think you have taken too much of this medicine contact a poison control center or emergency room at once. NOTE: This medicine is only for you. Do not share this medicine with others. What if I miss a dose? It is important not to miss your dose. Call your doctor or health care professional if you are unable to keep an appointment. What may interact with this medicine? Interactions have not been studied. This list may not describe all possible interactions. Give your health care provider a list of all the medicines, herbs, non-prescription drugs, or dietary supplements you use. Also tell them if you smoke, drink alcohol, or use illegal drugs. Some items may interact with your medicine. What  should I watch for while using this medicine? Your condition will be monitored carefully while you are receiving this medicine. You may need blood work done while you are taking this medicine. Do not become pregnant while taking this medicine or for at least 5 months after stopping it. Women should inform their doctor if they wish to become pregnant or think they might be pregnant. There is a potential for serious side effects to an unborn child. Talk to your health care professional or pharmacist for more information. Do not breast-feed an infant while taking this medicine or for at least 5 months after the last dose. What side effects may I notice from receiving this medicine? Side effects that you should report to your doctor or health care professional as soon as possible: -allergic reactions like skin rash, itching or hives, swelling of the face, lips, or tongue -black, tarry stools -bloody or watery diarrhea -breathing problems -changes in vision -chest pain or chest tightness -chills -facial flushing -fever -headache -signs and symptoms of high blood sugar such as dizziness; dry mouth; dry skin; fruity breath; nausea; stomach pain; increased hunger or thirst; increased urination -signs and symptoms of liver injury like dark yellow or brown urine; general ill feeling or flu-like symptoms; light-colored stools; loss of appetite; nausea; right upper belly pain; unusually weak or tired; yellowing of the eyes or skin -stomach pain -trouble passing urine or change in the amount of urine Side effects that usually do not require medical attention (report to your doctor or health care professional if they continue or are bothersome): -cough -diarrhea -joint pain -muscle pain -muscle weakness -tiredness -weight loss This list may not describe all   possible side effects. Call your doctor for medical advice about side effects. You may report side effects to FDA at 1-800-FDA-1088. Where should  I keep my medicine? This drug is given in a hospital or clinic and will not be stored at home. NOTE: This sheet is a summary. It may not cover all possible information. If you have questions about this medicine, talk to your doctor, pharmacist, or health care provider.  2018 Elsevier/Gold Standard (2015-05-15 17:54:14)  

## 2016-07-15 ENCOUNTER — Encounter: Payer: Self-pay | Admitting: Hematology & Oncology

## 2016-07-22 DIAGNOSIS — L814 Other melanin hyperpigmentation: Secondary | ICD-10-CM | POA: Diagnosis not present

## 2016-07-22 DIAGNOSIS — D224 Melanocytic nevi of scalp and neck: Secondary | ICD-10-CM | POA: Diagnosis not present

## 2016-07-22 DIAGNOSIS — L738 Other specified follicular disorders: Secondary | ICD-10-CM | POA: Diagnosis not present

## 2016-07-22 DIAGNOSIS — L821 Other seborrheic keratosis: Secondary | ICD-10-CM | POA: Diagnosis not present

## 2016-07-22 DIAGNOSIS — D1801 Hemangioma of skin and subcutaneous tissue: Secondary | ICD-10-CM | POA: Diagnosis not present

## 2016-07-22 DIAGNOSIS — L570 Actinic keratosis: Secondary | ICD-10-CM | POA: Diagnosis not present

## 2016-07-22 DIAGNOSIS — D225 Melanocytic nevi of trunk: Secondary | ICD-10-CM | POA: Diagnosis not present

## 2016-07-22 DIAGNOSIS — Z85828 Personal history of other malignant neoplasm of skin: Secondary | ICD-10-CM | POA: Diagnosis not present

## 2016-07-23 ENCOUNTER — Ambulatory Visit (INDEPENDENT_AMBULATORY_CARE_PROVIDER_SITE_OTHER): Payer: Medicare Other | Admitting: Ophthalmology

## 2016-07-23 DIAGNOSIS — E11319 Type 2 diabetes mellitus with unspecified diabetic retinopathy without macular edema: Secondary | ICD-10-CM | POA: Diagnosis not present

## 2016-07-23 DIAGNOSIS — I10 Essential (primary) hypertension: Secondary | ICD-10-CM

## 2016-07-23 DIAGNOSIS — E113591 Type 2 diabetes mellitus with proliferative diabetic retinopathy without macular edema, right eye: Secondary | ICD-10-CM | POA: Diagnosis not present

## 2016-07-23 DIAGNOSIS — H35033 Hypertensive retinopathy, bilateral: Secondary | ICD-10-CM | POA: Diagnosis not present

## 2016-07-23 DIAGNOSIS — E113292 Type 2 diabetes mellitus with mild nonproliferative diabetic retinopathy without macular edema, left eye: Secondary | ICD-10-CM | POA: Diagnosis not present

## 2016-07-23 DIAGNOSIS — H43813 Vitreous degeneration, bilateral: Secondary | ICD-10-CM

## 2016-07-23 DIAGNOSIS — H353131 Nonexudative age-related macular degeneration, bilateral, early dry stage: Secondary | ICD-10-CM | POA: Diagnosis not present

## 2016-07-27 ENCOUNTER — Other Ambulatory Visit: Payer: Self-pay

## 2016-07-27 MED ORDER — ACETAMINOPHEN-CODEINE 120-12 MG/5ML PO SUSP: 5.0000 mL | Freq: Four times a day (QID) | ORAL | 0 refills | Status: DC | PRN

## 2016-07-28 ENCOUNTER — Other Ambulatory Visit: Payer: Self-pay | Admitting: *Deleted

## 2016-07-28 MED ORDER — ONETOUCH ULTRASOFT LANCETS MISC: 1.0000 | Freq: Two times a day (BID) | 3 refills | Status: DC

## 2016-07-28 MED ORDER — GLUCOSE BLOOD VI STRP: 1.0000 | ORAL_STRIP | Freq: Two times a day (BID) | 3 refills | Status: DC

## 2016-07-28 NOTE — Telephone Encounter (Signed)
Rec'd fax stating pt has neber had her one touch ultra strips or lancets rx here. Requesting rx to be sent along w/ Dx code due to medicare. Verified chart pt is up-dated [facility].../lmb

## 2016-07-29 ENCOUNTER — Telehealth: Payer: Self-pay | Admitting: Internal Medicine

## 2016-07-29 NOTE — Telephone Encounter (Signed)
[employer], [facility] pharmacy to [facility] [address].  Needs one touch lancets and strips sent to new pharmacy. New pharmacy is requesting the prescription.

## 2016-07-29 NOTE — Telephone Encounter (Signed)
Rx's was sent to [facility] on yesterday...Samantha Quinn

## 2016-07-31 ENCOUNTER — Telehealth: Payer: Self-pay | Admitting: Internal Medicine

## 2016-07-31 MED ORDER — ONETOUCH ULTRASOFT LANCETS MISC: 1.0000 | Freq: Two times a day (BID) | 3 refills | Status: DC

## 2016-07-31 MED ORDER — GLUCOSE BLOOD VI STRP: 1.0000 | ORAL_STRIP | Freq: Two times a day (BID) | 3 refills | Status: DC

## 2016-07-31 NOTE — Addendum Note (Signed)
Addended by: Terence Lux B on: 07/31/2016 03:20 PM   Modules accepted: Orders

## 2016-07-31 NOTE — Telephone Encounter (Signed)
Prescriptions refaxed. Showed as faxed.

## 2016-07-31 NOTE — Telephone Encounter (Signed)
Pt called in and said that pharmacy did not get this, can it be resent   [employer]

## 2016-08-05 ENCOUNTER — Other Ambulatory Visit (HOSPITAL_BASED_OUTPATIENT_CLINIC_OR_DEPARTMENT_OTHER): Payer: Medicare Other

## 2016-08-05 ENCOUNTER — Ambulatory Visit (HOSPITAL_BASED_OUTPATIENT_CLINIC_OR_DEPARTMENT_OTHER): Payer: Medicare Other

## 2016-08-05 ENCOUNTER — Ambulatory Visit (HOSPITAL_BASED_OUTPATIENT_CLINIC_OR_DEPARTMENT_OTHER): Payer: Medicare Other | Admitting: Hematology & Oncology

## 2016-08-05 VITALS — BP 125/58 | HR 73 | Temp 98.0°F | Resp 20 | Wt 127.8 lb

## 2016-08-05 DIAGNOSIS — C3431 Malignant neoplasm of lower lobe, right bronchus or lung: Secondary | ICD-10-CM

## 2016-08-05 DIAGNOSIS — R05 Cough: Secondary | ICD-10-CM | POA: Diagnosis not present

## 2016-08-05 DIAGNOSIS — R053 Chronic cough: Secondary | ICD-10-CM

## 2016-08-05 DIAGNOSIS — Z5112 Encounter for antineoplastic immunotherapy: Secondary | ICD-10-CM | POA: Diagnosis present

## 2016-08-05 LAB — CBC WITH DIFFERENTIAL (CANCER CENTER ONLY)
BASO#: 0 10*3/uL (ref 0.0–0.2)
BASO%: 0.5 % (ref 0.0–2.0)
EOS ABS: 0.1 10*3/uL (ref 0.0–0.5)
EOS%: 1.9 % (ref 0.0–7.0)
HCT: 36.2 % (ref 34.8–46.6)
HEMOGLOBIN: 11.6 g/dL (ref 11.6–15.9)
LYMPH#: 0.8 10*3/uL — ABNORMAL LOW (ref 0.9–3.3)
LYMPH%: 11.9 % — AB (ref 14.0–48.0)
MCH: 26.6 pg (ref 26.0–34.0)
MCHC: 32 g/dL (ref 32.0–36.0)
MCV: 83 fL (ref 81–101)
MONO#: 0.5 10*3/uL (ref 0.1–0.9)
MONO%: 7 % (ref 0.0–13.0)
NEUT#: 5.1 10*3/uL (ref 1.5–6.5)
NEUT%: 78.7 % (ref 39.6–80.0)
Platelets: 310 10*3/uL (ref 145–400)
RBC: 4.36 10*6/uL (ref 3.70–5.32)
RDW: 15.9 % — ABNORMAL HIGH (ref 11.1–15.7)
WBC: 6.5 10*3/uL (ref 3.9–10.0)

## 2016-08-05 LAB — CMP (CANCER CENTER ONLY)
ALBUMIN: 3.4 g/dL (ref 3.3–5.5)
ALT(SGPT): 21 U/L (ref 10–47)
AST: 23 U/L (ref 11–38)
Alkaline Phosphatase: 76 U/L (ref 26–84)
BILIRUBIN TOTAL: 0.6 mg/dL (ref 0.20–1.60)
BUN, Bld: 15 mg/dL (ref 7–22)
CHLORIDE: 99 meq/L (ref 98–108)
CO2: 28 meq/L (ref 18–33)
Calcium: 9.5 mg/dL (ref 8.0–10.3)
Creat: 0.7 mg/dl (ref 0.6–1.2)
GLUCOSE: 102 mg/dL (ref 73–118)
Potassium: 3.6 mEq/L (ref 3.3–4.7)
SODIUM: 140 meq/L (ref 128–145)
Total Protein: 6.9 g/dL (ref 6.4–8.1)

## 2016-08-05 MED ORDER — SODIUM CHLORIDE 0.9 % IV SOLN
Freq: Once | INTRAVENOUS | Status: AC
  Administered 2016-08-05: 15:00:00 via INTRAVENOUS

## 2016-08-05 MED ORDER — HYDROCODONE-HOMATROPINE 5-1.5 MG/5ML PO SYRP: 2.5000 mL | ORAL_SOLUTION | Freq: Every evening | ORAL | 0 refills | Status: DC | PRN

## 2016-08-05 MED ORDER — SODIUM CHLORIDE 0.9 % IV SOLN
1200.0000 mg | Freq: Once | INTRAVENOUS | Status: AC
  Administered 2016-08-05: 1200 mg via INTRAVENOUS
  Filled 2016-08-05: qty 20

## 2016-08-05 MED FILL — HYDROCODONE-HOMATROPINE SYR: 5-1.5 | 48 days supply | Qty: 120 | Fill #0

## 2016-08-05 NOTE — Progress Notes (Signed)
Hematology and Oncology Follow Up Visit  Samantha Quinn [id number] January 15, 1928 83 y.o. 08/05/2016   Principle Diagnosis:   Stage IV adenocarcinoma of the right lung-mutation negative  Current Therapy:    Tecentriq 1200 mg IV every 3 weeks s/p cycle #1  Status post radiation/chemotherapy for recurrence-completed October 2017  Status post right upper lobectomy in March 2013 for stage IB disease     Interim History:  Samantha Quinn is back for follow-up. She looks quite good. She did well with her first cycle of treatment. She did have some cough. She'll of a fever afterwards., Sure if this was from the [place].  She's had no problems with increased shortness of breath. She's had no diarrhea. She's had no rashes.  Her appetite has been good.  She's had no leg swelling.  His been no headache.  She comes in with her daughter. Overall, I would have to say that she is doing quite well.  Overall, her performance status is ECOG 2.  Medications:  Current Outpatient Prescriptions:  .  acetaminophen-codeine (CAPITAL/CODEINE) 120-12 MG/5ML suspension, Take 5 mLs by mouth every 6 (six) hours as needed for pain., Disp: 60 mL, Rfl: 0 .  amitriptyline (ELAVIL) 10 MG tablet, Take 1 tablet (10 mg total) by mouth at bedtime as needed. (Patient taking differently: Take 5 mg by mouth at bedtime. ), Disp: 90 tablet, Rfl: 1 .  bisoprolol-hydrochlorothiazide (ZIAC) 2.5-6.25 MG tablet, Take 1 tablet by mouth daily., Disp: 90 tablet, Rfl: 3 .  Blood Glucose Monitoring Suppl (ONE TOUCH ULTRA 2) w/Device KIT, Use to check blood sugars twice a day Dx E11.9, Disp: 1 each, Rfl: 0 .  calcium carbonate (OS-CAL) 600 MG tablet, Take 600 mg by mouth daily., Disp: , Rfl:  .  gabapentin (NEURONTIN) 100 MG capsule, Take 200 mg by mouth 3 (three) times daily. , Disp: , Rfl:  .  glucose blood (ONE TOUCH ULTRA TEST) test strip, 1 each by Other route 2 (two) times daily. Use to check blood sugars twice a day, Disp: 100  each, Rfl: 3 .  HYDROcodone-homatropine (HYCODAN) 5-1.5 MG/5ML syrup, Take 2.5 mLs by mouth at bedtime as needed for cough., Disp: , Rfl:  .  ibuprofen (ADVIL,MOTRIN) 200 MG tablet, Take 200 mg by mouth every 6 (six) hours as needed for fever, headache or mild pain. , Disp: , Rfl:  .  Lancets (ONETOUCH ULTRASOFT) lancets, 1 each by Other route 2 (two) times daily. Use to help check blood sugars twice a day, Disp: 100 each, Rfl: 3 .  lovastatin (MEVACOR) 20 MG tablet, Take 1 tablet (20 mg total) by mouth every other day., Disp: 90 tablet, Rfl: 1 .  metFORMIN (GLUCOPHAGE) 500 MG tablet, Take 1 tablet (500 mg total) by mouth 2 (two) times daily with a meal., Disp: 180 tablet, Rfl: 3 .  methimazole (TAPAZOLE) 5 MG tablet, Take 1 tablet (5 mg total) by mouth daily., Disp: 60 tablet, Rfl: 1 .  ramipril (ALTACE) 2.5 MG capsule, Take 1 capsule (2.5 mg total) by mouth daily., Disp: 90 capsule, Rfl: 2  Allergies:  Allergies  Allergen Reactions  . Codeine Nausea And Vomiting  . Tramadol Other (See Comments)    syncope  . Demerol Nausea Only    Past Medical History, Surgical history, Social history, and Family History were reviewed and updated.  Review of Systems: As above  Physical Exam:  weight is 127 lb 12.8 oz (58 kg). Her oral temperature is 98 F (36.7 C). Her blood  pressure is 125/58 (abnormal) and her pulse is 73. Her respiration is 20 and oxygen saturation is 97%.   Wt Readings from Last 3 Encounters:  08/05/16 127 lb 12.8 oz (58 kg)  06/29/16 126 lb 4.8 oz (57.3 kg)  06/25/16 125 lb (56.7 kg)     Elderly but well-nourished white female in no obvious distress. Head and neck exam shows no ocular or oral lesions. She has no palpable cervical or supraclavicular lymph nodes. Lungs show clear breath sounds bilaterally. She has good air movement bilaterally. Cardiac exam regular rate and rhythm with no murmurs, rubs or bruits. Abdomen is soft. She has decent bowel sounds. There is no fluid  wave. There is no palpable liver or spleen tip. Externally shows no clubbing, cyanosis or edema. She has good range of motion of her joints. She has good strength. Skin exam shows no rashes, ecchymoses or petechia. Neurological exam shows no focal neurological deficits.  Lab Results  Component Value Date   WBC 6.5 08/05/2016   HGB 11.6 08/05/2016   HCT 36.2 08/05/2016   MCV 83 08/05/2016   PLT 310 08/05/2016     Chemistry      Component Value Date/Time   NA 139 06/08/2016 1316   K 3.7 06/08/2016 1316   CL 98 05/05/2016 1702   CO2 24 06/08/2016 1316   BUN 13.3 06/08/2016 1316   CREATININE 0.7 06/08/2016 1316      Component Value Date/Time   CALCIUM 9.8 06/08/2016 1316   ALKPHOS 79 06/08/2016 1316   AST 14 06/08/2016 1316   ALT 11 06/08/2016 1316   BILITOT 0.25 06/08/2016 1316         Impression and Plan: Samantha Quinn is a 83 year old white female. She has recurrent non-small cell lung cancer. This appears to be adenocarcinoma. She does not have any mutations that we can target.  We'll proceed with her second cycle of treatment. I think she has tolerated this quite well.  We will go ahead and plan for a total of 4 cycles before we reassess her. I think after 4 cycles, we will clearly no if this is working. If it is not working, then I think she will be very comfortable being referred to hospice.  I am glad that her quality of life is doing pretty well right now. I think this is incredibly important for her.   I spent about 35 minutes with she and her daughter. I answered all their questions.  They are very pleased with how professional our staff is and the compassion that our staff has in treating her.    [name], MD 4/11/20181:50 PM

## 2016-08-05 NOTE — Patient Instructions (Signed)
Atezolizumab injection What is this medicine? ATEZOLIZUMAB (a te zoe LIZ ue mab) is a monoclonal antibody. It is used to treat bladder cancer (urothelial cancer) and non-small cell lung cancer. This medicine may be used for other purposes; ask your health care provider or pharmacist if you have questions. COMMON BRAND NAME(S): Tecentriq What should I tell my health care provider before I take this medicine? They need to know if you have any of these conditions: -diabetes -immune system problems -infection -inflammatory bowel disease -liver disease -lung or breathing disease -lupus -nervous system problems like myasthenia gravis or Guillain-Barre syndrome -organ transplant -an unusual or allergic reaction to atezolizumab, other medicines, foods, dyes, or preservatives -pregnant or trying to get pregnant -breast-feeding How should I use this medicine? This medicine is for infusion into a vein. It is given by a health care professional in a hospital or clinic setting. A special MedGuide will be given to you before each treatment. Be sure to read this information carefully each time. Talk to your pediatrician regarding the use of this medicine in children. Special care may be needed. Overdosage: If you think you have taken too much of this medicine contact a poison control center or emergency room at once. NOTE: This medicine is only for you. Do not share this medicine with others. What if I miss a dose? It is important not to miss your dose. Call your doctor or health care professional if you are unable to keep an appointment. What may interact with this medicine? Interactions have not been studied. This list may not describe all possible interactions. Give your health care provider a list of all the medicines, herbs, non-prescription drugs, or dietary supplements you use. Also tell them if you smoke, drink alcohol, or use illegal drugs. Some items may interact with your medicine. What  should I watch for while using this medicine? Your condition will be monitored carefully while you are receiving this medicine. You may need blood work done while you are taking this medicine. Do not become pregnant while taking this medicine or for at least 5 months after stopping it. Women should inform their doctor if they wish to become pregnant or think they might be pregnant. There is a potential for serious side effects to an unborn child. Talk to your health care professional or pharmacist for more information. Do not breast-feed an infant while taking this medicine or for at least 5 months after the last dose. What side effects may I notice from receiving this medicine? Side effects that you should report to your doctor or health care professional as soon as possible: -allergic reactions like skin rash, itching or hives, swelling of the face, lips, or tongue -black, tarry stools -bloody or watery diarrhea -breathing problems -changes in vision -chest pain or chest tightness -chills -facial flushing -fever -headache -signs and symptoms of high blood sugar such as dizziness; dry mouth; dry skin; fruity breath; nausea; stomach pain; increased hunger or thirst; increased urination -signs and symptoms of liver injury like dark yellow or brown urine; general ill feeling or flu-like symptoms; light-colored stools; loss of appetite; nausea; right upper belly pain; unusually weak or tired; yellowing of the eyes or skin -stomach pain -trouble passing urine or change in the amount of urine Side effects that usually do not require medical attention (report to your doctor or health care professional if they continue or are bothersome): -cough -diarrhea -joint pain -muscle pain -muscle weakness -tiredness -weight loss This list may not describe all   possible side effects. Call your doctor for medical advice about side effects. You may report side effects to FDA at 1-800-FDA-1088. Where should  I keep my medicine? This drug is given in a hospital or clinic and will not be stored at home. NOTE: This sheet is a summary. It may not cover all possible information. If you have questions about this medicine, talk to your doctor, pharmacist, or health care provider.  2018 Elsevier/Gold Standard (2015-05-15 17:54:14)  

## 2016-08-12 ENCOUNTER — Other Ambulatory Visit: Payer: Self-pay | Admitting: Internal Medicine

## 2016-08-12 ENCOUNTER — Other Ambulatory Visit (INDEPENDENT_AMBULATORY_CARE_PROVIDER_SITE_OTHER): Payer: Medicare Other

## 2016-08-12 DIAGNOSIS — E0590 Thyrotoxicosis, unspecified without thyrotoxic crisis or storm: Secondary | ICD-10-CM

## 2016-08-12 LAB — TSH: TSH: 0.12 u[IU]/mL — AB (ref 0.35–4.50)

## 2016-08-12 LAB — T4, FREE: Free T4: 0.46 ng/dL — ABNORMAL LOW (ref 0.60–1.60)

## 2016-08-12 LAB — T3, FREE: T3 FREE: 3 pg/mL (ref 2.3–4.2)

## 2016-08-13 ENCOUNTER — Encounter: Payer: Self-pay | Admitting: Internal Medicine

## 2016-08-18 ENCOUNTER — Telehealth: Payer: Self-pay

## 2016-08-18 ENCOUNTER — Telehealth: Payer: Self-pay | Admitting: Internal Medicine

## 2016-08-18 NOTE — Telephone Encounter (Signed)
Spoke with patient, submitted questions to Dr.Gherghe to get her opinion on how to proceed.

## 2016-08-18 NOTE — Telephone Encounter (Signed)
Patient called states that she will be having infusion treatments on 08/25/16 , she is scheduled to have her uptake and scan done on 08/27/16-08/28/16; will that infusion have effect on her uptake and scan.   Please advise. Thank you!

## 2016-08-18 NOTE — Telephone Encounter (Signed)
Please call patient to advise if her infusion and imaging appointments will conflict with one another. Patient expressed concern about her appointments. Please call home number before the end of day per patient.

## 2016-08-18 NOTE — Telephone Encounter (Signed)
I think it should be fine, but maybe they can avoid dexamethasone this is usually given during this infusions. Just to make sure, can you please check with a nuclear medicine people?

## 2016-08-19 ENCOUNTER — Telehealth: Payer: Self-pay

## 2016-08-19 ENCOUNTER — Telehealth: Payer: Self-pay | Admitting: *Deleted

## 2016-08-19 NOTE — Telephone Encounter (Signed)
Patient is scheduled a thyroid uptake test a few days after her next treatment. She wants to make sure that the two don't interfere with one another.  Spoke with Dr Marin Olp and Edythe Lynn D and both believe the scheduling of the testing is fine, and there shouldn't be any interaction.  Patient is aware.

## 2016-08-19 NOTE — Telephone Encounter (Signed)
I called [facility], and they stated that as long as the infusion did not have any iodine in it, it should be fine.

## 2016-08-19 NOTE — Telephone Encounter (Signed)
Patient called regarding the infusion and the uptake and scan. After discussing with Dr.Gherghe and [facility] they believed she would be okay to continue with both things. I will contact patient and notify her.

## 2016-08-19 NOTE — Telephone Encounter (Signed)
Called and notified that from our stand point patient would be okay to do both infusion and uptake and scan.  Patient understood.

## 2016-08-24 ENCOUNTER — Ambulatory Visit (HOSPITAL_COMMUNITY): Payer: Medicare Other

## 2016-08-25 ENCOUNTER — Ambulatory Visit: Payer: Medicare Other

## 2016-08-25 ENCOUNTER — Encounter (HOSPITAL_BASED_OUTPATIENT_CLINIC_OR_DEPARTMENT_OTHER): Payer: Self-pay | Admitting: Emergency Medicine

## 2016-08-25 ENCOUNTER — Emergency Department (HOSPITAL_BASED_OUTPATIENT_CLINIC_OR_DEPARTMENT_OTHER): Payer: Medicare Other

## 2016-08-25 ENCOUNTER — Emergency Department (HOSPITAL_BASED_OUTPATIENT_CLINIC_OR_DEPARTMENT_OTHER)
Admission: EM | Admit: 2016-08-25 | Discharge: 2016-08-25 | Disposition: A | Discharge status: Home / Self Care | Payer: Medicare Other | Attending: Emergency Medicine | Admitting: Emergency Medicine

## 2016-08-25 ENCOUNTER — Other Ambulatory Visit: Payer: Medicare Other

## 2016-08-25 ENCOUNTER — Ambulatory Visit: Payer: Medicare Other | Admitting: Hematology & Oncology

## 2016-08-25 ENCOUNTER — Other Ambulatory Visit (HOSPITAL_COMMUNITY): Payer: Medicare Other

## 2016-08-25 DIAGNOSIS — S80211A Abrasion, right knee, initial encounter: Secondary | ICD-10-CM | POA: Insufficient documentation

## 2016-08-25 DIAGNOSIS — I10 Essential (primary) hypertension: Secondary | ICD-10-CM | POA: Diagnosis not present

## 2016-08-25 DIAGNOSIS — W01198A Fall on same level from slipping, tripping and stumbling with subsequent striking against other object, initial encounter: Secondary | ICD-10-CM | POA: Diagnosis not present

## 2016-08-25 DIAGNOSIS — S62634A Displaced fracture of distal phalanx of right ring finger, initial encounter for closed fracture: Secondary | ICD-10-CM | POA: Insufficient documentation

## 2016-08-25 DIAGNOSIS — Y999 Unspecified external cause status: Secondary | ICD-10-CM | POA: Insufficient documentation

## 2016-08-25 DIAGNOSIS — S8991XA Unspecified injury of right lower leg, initial encounter: Secondary | ICD-10-CM | POA: Diagnosis not present

## 2016-08-25 DIAGNOSIS — Z7984 Long term (current) use of oral hypoglycemic drugs: Secondary | ICD-10-CM | POA: Diagnosis not present

## 2016-08-25 DIAGNOSIS — E039 Hypothyroidism, unspecified: Secondary | ICD-10-CM | POA: Insufficient documentation

## 2016-08-25 DIAGNOSIS — E119 Type 2 diabetes mellitus without complications: Secondary | ICD-10-CM | POA: Insufficient documentation

## 2016-08-25 DIAGNOSIS — S0990XA Unspecified injury of head, initial encounter: Secondary | ICD-10-CM

## 2016-08-25 DIAGNOSIS — S0003XA Contusion of scalp, initial encounter: Secondary | ICD-10-CM | POA: Diagnosis not present

## 2016-08-25 DIAGNOSIS — S61411A Laceration without foreign body of right hand, initial encounter: Secondary | ICD-10-CM | POA: Diagnosis not present

## 2016-08-25 DIAGNOSIS — S61419A Laceration without foreign body of unspecified hand, initial encounter: Secondary | ICD-10-CM

## 2016-08-25 DIAGNOSIS — S022XXA Fracture of nasal bones, initial encounter for closed fracture: Secondary | ICD-10-CM | POA: Diagnosis not present

## 2016-08-25 DIAGNOSIS — Y939 Activity, unspecified: Secondary | ICD-10-CM | POA: Insufficient documentation

## 2016-08-25 DIAGNOSIS — Y92481 Parking lot as the place of occurrence of the external cause: Secondary | ICD-10-CM | POA: Diagnosis not present

## 2016-08-25 DIAGNOSIS — W19XXXA Unspecified fall, initial encounter: Secondary | ICD-10-CM

## 2016-08-25 DIAGNOSIS — S62632A Displaced fracture of distal phalanx of right middle finger, initial encounter for closed fracture: Secondary | ICD-10-CM | POA: Diagnosis not present

## 2016-08-25 DIAGNOSIS — Z79899 Other long term (current) drug therapy: Secondary | ICD-10-CM | POA: Insufficient documentation

## 2016-08-25 DIAGNOSIS — S62639A Displaced fracture of distal phalanx of unspecified finger, initial encounter for closed fracture: Secondary | ICD-10-CM

## 2016-08-25 NOTE — ED Notes (Signed)
Wound care provided; wounds cleaned with Saf-Cleanse; Mepitel dressing to RT hand, covered with gauze and wrapped with Coban. Bacitracin and bandaids applied to RT knee.

## 2016-08-25 NOTE — Discharge Instructions (Signed)
Wear finger splint until pain resolved or told otherwise by physician. Tylenol and ice for pain If you were given medicines take as directed.  If you are on coumadin or contraceptives realize their levels and effectiveness is altered by many different medicines.  If you have any reaction (rash, tongues swelling, other) to the medicines stop taking and see a physician.    If your blood pressure was elevated in the ER make sure you follow up for management with a primary doctor or return for chest pain, shortness of breath or stroke symptoms.  Please follow up as directed and return to the ER or see a physician for new or worsening symptoms.  Thank you. Vitals:   08/25/16 0925  BP: (!) 170/86  Pulse: 88  Resp: 18  Temp: 97.9 F (36.6 C)  TempSrc: Oral  SpO2: 99%  Weight: 126 lb (57.2 kg)  Height: '5\' 1"'$  (1.549 m)

## 2016-08-25 NOTE — ED Provider Notes (Signed)
[facility] DEPT [facility] Provider Note   CSN: [id number] Arrival date & time: 08/25/16  0919     History   Chief Complaint Chief Complaint  Patient presents with  . Fall    HPI SHEELA MCCULLEY is a 83 y.o. female.  Patient presents after a fall in the parking lot on her way to her scheduled infusion appointment with oncology. Patient hit the front of her head and face along with her right hand and right knee. No loss of consciousness. Patient is on blood thinners. Pain and swelling to the face. Patient felt well otherwise today.      Past Medical History:  Diagnosis Date  . Back pain    bulding disc  . Bronchitis    hx of-many,many years   . Diabetes mellitus   . Diabetes mellitus    type 2 and takes Metformin bid  . Dyslipidemia   . Embolism - blood clot    hx of in left lower leg and was on Coumadin for about 28month;this was about 8-157yrago  . Family history of adverse reaction to anesthesia    Daughter- Nausea  . History of colonic polyps   . HOH (hard of hearing)   . Hx of migraines    as a teenager  . Hypercholesterolemia   . Hyperlipidemia    takes Lovastatin every other day  . Hypertension    takes Ziac daily and Ramipril as well  . Hypothyroidism    takes Synthroid every other day  . Insomnia    takes Elavil nightly  . Joint pain   . Lung mass   . Myalgia   . Osteoarthritis   . Osteoporosis   . Pneumothorax of right lung after biopsy 07/09/11   HAD CHEST TUBE PLACEMENT  . PONV (postoperative nausea and vomiting)   . Primary adenocarcinoma of lower lobe of right lung ([place]2/27/2013   pT2a, pN0 M0 Stage 1 Well differenced Adenocarcinoma 3.5 cm resected 07/17/2011  . Primary adenocarcinoma of lung (HCO[place]2/27/2013  . Shortness of breath dyspnea    With exertion  . Skin cancer    legs have dark spots on them  . Upper respiratory infection 04/2011  . Urinary incontinence    takes Detrol daily prn    Patient Active Problem List   Diagnosis Date  Noted  . Thyrotoxicosis without thyroid storm 06/22/2016  . Fatigue 05/05/2016  . Macular degeneration 03/09/2016  . Encounter for antineoplastic chemotherapy 12/19/2015  . Meralgia paresthetica of left side 07/10/2015  . Osteoporosis 07/10/2015  . Onychomycosis 07/10/2015  . Anxiety 07/10/2015  . Diabetes (HC[facility]03/15/2017  . Cough 12/12/2013  . Multinodular goiter (nontoxic) 09/29/2012  . Pneumothorax after biopsy 07/09/2011    Class: Acute  . Primary adenocarcinoma of lower lobe of right lung ([place]02/27/2013  . Hypercholesterolemia 10/20/2010  . Essential hypertension, benign 10/20/2010  . Hypothyroidism (acquired) 10/20/2010  . Osteoarthritis 10/20/2010    Past Surgical History:  Procedure Laterality Date  . ABDOMINAL HYSTERECTOMY    . APPENDECTOMY     as a child  . CHEST TUBE REMOVAL  07/13/11   RIGHT  CHEST TUBE  . CHOLECYSTECTOMY    . COLONOSCOPY    . COLONOSCOPY    . DILATION AND CURETTAGE OF UTERUS     x 2  . EYE SURGERY Bilateral    Cataract  . HEMORRHOID SURGERY    . LUNG BIOPSY  07/09/11   RIGHT UPER LOBE LUNG MASS   . RIGHT  CHEST TUBE PLACEMENT  07/10/11   S/P LUNG BX  . ROTATOR CUFF REPAIR     bilateral  . TOE SURGERY  2013   right small toe  . TONSILLECTOMY     as a child  . VIDEO BRONCHOSCOPY  07/17/2011   Procedure: VIDEO BRONCHOSCOPY;  Surgeon: Grace Isaac, MD;  Location: [facility];  Service: Thoracic;  Laterality: N/A;  . VIDEO BRONCHOSCOPY WITH ENDOBRONCHIAL ULTRASOUND N/A 12/10/2015   Procedure: VIDEO BRONCHOSCOPY WITH ENDOBRONCHIAL ULTRASOUND with biopsies.;  Surgeon: Grace Isaac, MD;  Location: [facility] OR;  Service: Thoracic;  Laterality: N/A;    OB History    No data available       Home Medications    Prior to Admission medications   Medication Sig Start Date End Date Taking? Authorizing Provider  acetaminophen-codeine (CAPITAL/CODEINE) 120-12 MG/5ML suspension Take 5 mLs by mouth every 6 (six) hours as needed for pain. 07/27/16    [name], MD  amitriptyline (ELAVIL) 10 MG tablet Take 1 tablet (10 mg total) by mouth at bedtime as needed. Patient taking differently: Take 5 mg by mouth at bedtime.  07/10/15   Binnie Rail, MD  bisoprolol-hydrochlorothiazide (ZIAC) 2.5-6.25 MG tablet Take 1 tablet by mouth daily. 07/10/15   Binnie Rail, MD  Blood Glucose Monitoring Suppl (ONE TOUCH ULTRA 2) w/Device KIT Use to check blood sugars twice a day Dx E11.9 01/07/16   Binnie Rail, MD  calcium carbonate (OS-CAL) 600 MG tablet Take 600 mg by mouth daily.    Historical Provider, MD  gabapentin (NEURONTIN) 100 MG capsule Take 200 mg by mouth 3 (three) times daily.     Historical Provider, MD  glucose blood (ONE TOUCH ULTRA TEST) test strip 1 each by Other route 2 (two) times daily. Use to check blood sugars twice a day 07/31/16   Binnie Rail, MD  HYDROcodone-homatropine [facility]) 5-1.5 MG/5ML syrup Take 2.5 mLs by mouth at bedtime as needed for cough. 08/05/16   [name], MD  ibuprofen (ADVIL,MOTRIN) 200 MG tablet Take 200 mg by mouth every 6 (six) hours as needed for fever, headache or mild pain.     Historical Provider, MD  Lancets [facility] ULTRASOFT) lancets 1 each by Other route 2 (two) times daily. Use to help check blood sugars twice a day 07/31/16   Binnie Rail, MD  lovastatin (MEVACOR) 20 MG tablet Take 1 tablet (20 mg total) by mouth every other day. 07/10/15   Binnie Rail, MD  metFORMIN (GLUCOPHAGE) 500 MG tablet Take 1 tablet (500 mg total) by mouth 2 (two) times daily with a meal. 05/05/16   Binnie Rail, MD  methimazole (TAPAZOLE) 5 MG tablet Take 1 tablet (5 mg total) by mouth daily. 06/23/16   Philemon Kingdom, MD  ramipril (ALTACE) 2.5 MG capsule Take 1 capsule (2.5 mg total) by mouth daily. 07/10/15   Binnie Rail, MD    Family History Family History  Problem Relation Age of Onset  . Hypertension Mother   . Hypertension Father   . Arthritis Sister   . Arthritis Sister   . Diabetes Brother   .  Hypertension Sister   . Anesthesia problems Neg Hx   . Hypotension Neg Hx   . Malignant hyperthermia Neg Hx   . Pseudochol deficiency Neg Hx   . Heart attack Neg Hx     Social History Social History  Substance Use Topics  . Smoking status: Never Smoker  .  Smokeless tobacco: Never Used  . Alcohol use No     Allergies   Codeine; Tramadol; and Demerol   Review of Systems Review of Systems  Constitutional: Negative for chills and fever.  HENT: Negative for congestion.   Eyes: Negative for visual disturbance.  Respiratory: Negative for shortness of breath.   Cardiovascular: Negative for chest pain.  Gastrointestinal: Negative for abdominal pain and vomiting.  Genitourinary: Negative for dysuria and flank pain.  Musculoskeletal: Positive for arthralgias. Negative for back pain, neck pain and neck stiffness.  Skin: Negative for rash.  Neurological: Positive for headaches. Negative for weakness and light-headedness.     Physical Exam Updated Vital Signs BP 139/71 (BP Location: Left Arm)   Pulse 80   Temp 97.9 F (36.6 C) (Oral)   Resp 16   Ht '5\' 1"'  (1.549 m)   Wt 126 lb (57.2 kg)   SpO2 100%   BMI 23.81 kg/m   Physical Exam  Constitutional: She is oriented to person, place, and time. She appears well-developed and well-nourished.  HENT:  Head: Normocephalic.  Patient has edema and tenderness to mid for head and nasal bridge. No septal hematoma. No tenderness to the jaw. Neck supple no midline tenderness full range of motion. No hip tenderness with range of motion. Patient is mild tenderness to right anterior patella with flexion and palpation per superficial abrasions to right knee.  Eyes: Right eye exhibits no discharge. Left eye exhibits no discharge.  Neck: Normal range of motion. Neck supple. No tracheal deviation present.  Cardiovascular: Normal rate and regular rhythm.   Pulmonary/Chest: Effort normal.  Abdominal: Soft. She exhibits no distension. There is no  tenderness. There is no guarding.  Musculoskeletal: She exhibits no edema.  Neurological: She is alert and oriented to person, place, and time.  Skin: Skin is warm. No rash noted.  Patient has skin tear superficial to the right hand without significant tenderness underlying. Patient is mild tenderness to middle finger on the right hand with normal tendon function flexion extension.  Psychiatric: She has a normal mood and affect.  Nursing note and vitals reviewed.    ED Treatments / Results  Labs (all labs ordered are listed, but only abnormal results are displayed) Labs Reviewed - No data to display  EKG  EKG Interpretation None       Radiology Ct Head Wo Contrast  Result Date: 08/25/2016 CLINICAL DATA:  Status post fall. Multiple skin tears. Hematoma toe forehead. No loss of consciousness. EXAM: CT HEAD WITHOUT CONTRAST CT MAXILLOFACIAL WITHOUT CONTRAST TECHNIQUE: Multidetector CT imaging of the head and maxillofacial structures were performed using the standard protocol without intravenous contrast. Multiplanar CT image reconstructions of the maxillofacial structures were also generated. COMPARISON:  None. FINDINGS: CT HEAD FINDINGS Brain: No evidence of acute infarction, hemorrhage, extra-axial collection, ventriculomegaly, or mass effect. Generalized cerebral atrophy. Vascular: Cerebrovascular atherosclerotic calcifications are noted. Skull: Negative for fracture or focal lesion. Sinuses/Orbits: Visualized portions of the orbits are unremarkable. Visualized portions of the paranasal sinuses and mastoid air cells are unremarkable. Other: Left frontal scalp hematoma. CT MAXILLOFACIAL FINDINGS Osseous: Bilateral comminuted nasal bone fractures with mild displacement. No other acute fracture or dislocation. Temporomandibular joints are congruent. Mild osteoarthritis of bilateral temporomandibular joints. No aggressive osseous lesion. Orbits: Negative. No traumatic or inflammatory finding.  Sinuses: Clear. Soft tissues: Soft tissue swelling over the nasal bone with soft tissue emphysema. IMPRESSION: 1. No acute intracranial pathology. 2. Comminuted bilateral nasal bone fractures with mild displacement and overlying soft  tissue swelling and soft tissue emphysema. 3. Left frontal scalp hematoma. Electronically Signed   By: Kathreen Devoid   On: 08/25/2016 10:07   Dg Knee Complete 4 Views Right  Result Date: 08/25/2016 CLINICAL DATA:  Status post trip and fall over a curb in a parking lot yesterday with onset of left knee pain. Initial encounter. EXAM: RIGHT KNEE - COMPLETE 4+ VIEW COMPARISON:  None. FINDINGS: No evidence of fracture, dislocation, or joint effusion. No evidence of arthropathy or other focal bone abnormality. Soft tissues are unremarkable. IMPRESSION: Negative exam. Electronically Signed   By: Inge Rise M.D.   On: 08/25/2016 10:21   Dg Hand Complete Right  Result Date: 08/25/2016 CLINICAL DATA:  Tripped. Fall. Laceration to right hand. Pain and middle and ring finger. EXAM: RIGHT HAND - COMPLETE 3+ VIEW COMPARISON:  None. FINDINGS: The bones appear diffusely osteopenic. Scattered degenerative changes are noted. This is most advanced at the second PIP joint. Small, age indeterminate appearing fracture deformity involving the tuft of the third distal phalanx is identified. IMPRESSION: 1. There is a small age indeterminate fracture arising from the tuft of the third distal phalanx. 2. Osteopenia 3. Degenerative changes. Electronically Signed   By: Kerby Moors M.D.   On: 08/25/2016 10:24   Ct Maxillofacial Wo Contrast  Result Date: 08/25/2016 CLINICAL DATA:  Status post fall. Multiple skin tears. Hematoma toe forehead. No loss of consciousness. EXAM: CT HEAD WITHOUT CONTRAST CT MAXILLOFACIAL WITHOUT CONTRAST TECHNIQUE: Multidetector CT imaging of the head and maxillofacial structures were performed using the standard protocol without intravenous contrast. Multiplanar CT image  reconstructions of the maxillofacial structures were also generated. COMPARISON:  None. FINDINGS: CT HEAD FINDINGS Brain: No evidence of acute infarction, hemorrhage, extra-axial collection, ventriculomegaly, or mass effect. Generalized cerebral atrophy. Vascular: Cerebrovascular atherosclerotic calcifications are noted. Skull: Negative for fracture or focal lesion. Sinuses/Orbits: Visualized portions of the orbits are unremarkable. Visualized portions of the paranasal sinuses and mastoid air cells are unremarkable. Other: Left frontal scalp hematoma. CT MAXILLOFACIAL FINDINGS Osseous: Bilateral comminuted nasal bone fractures with mild displacement. No other acute fracture or dislocation. Temporomandibular joints are congruent. Mild osteoarthritis of bilateral temporomandibular joints. No aggressive osseous lesion. Orbits: Negative. No traumatic or inflammatory finding. Sinuses: Clear. Soft tissues: Soft tissue swelling over the nasal bone with soft tissue emphysema. IMPRESSION: 1. No acute intracranial pathology. 2. Comminuted bilateral nasal bone fractures with mild displacement and overlying soft tissue swelling and soft tissue emphysema. 3. Left frontal scalp hematoma. Electronically Signed   By: Kathreen Devoid   On: 08/25/2016 10:07    Procedures Procedures (including critical care time)  Medications Ordered in ED Medications - No data to display   Initial Impression / Assessment and Plan / ED Course  I have reviewed the triage vital signs and the nursing notes.  Pertinent labs & imaging results that were available during my care of the patient were reviewed by me and considered in my medical decision making (see chart for details).   patient presents after mechanical fall and multiple injuries. CT scan head no intracranial bleeding. Patient is comminuted nasal bone fracture that will need ENT outpatient follow-up. Nursing will contact oncology to ensure close follow-up for her infusion. Pain  meds ordered.  Results and differential diagnosis were discussed with the patient/parent/guardian. Xrays were independently reviewed by myself.  Close follow up outpatient was discussed, comfortable with the plan.   Medications - No data to display  Vitals:   08/25/16 0925 08/25/16 1129  BP: (!) 170/86 139/71  Pulse: 88 80  Resp: 18 16  Temp: 97.9 F (36.6 C)   TempSrc: Oral   SpO2: 99% 100%  Weight: 126 lb (57.2 kg)   Height: '5\' 1"'  (1.549 m)     Final diagnoses:  Fall, initial encounter  Acute head injury, initial encounter  Closed fracture of nasal bone, initial encounter  Skin tear of hand without complication, initial encounter  Closed fracture of tuft of distal phalanx of finger      Final Clinical Impressions(s) / ED Diagnoses   Final diagnoses:  Fall, initial encounter  Acute head injury, initial encounter  Closed fracture of nasal bone, initial encounter  Skin tear of hand without complication, initial encounter  Closed fracture of tuft of distal phalanx of finger    New Prescriptions New Prescriptions   No medications on file     Elnora Morrison, MD 08/25/16 1141

## 2016-08-25 NOTE — ED Triage Notes (Signed)
Pt fell in parking lot on her way to scheduled appt. Multiple skin tears and hematoma to forehead. Denies LOC.

## 2016-08-27 ENCOUNTER — Encounter (HOSPITAL_COMMUNITY)
Admission: RE | Admit: 2016-08-27 | Discharge: 2016-08-27 | Disposition: A | Discharge status: Home / Self Care | Payer: Medicare Other | Source: Ambulatory Visit | Attending: Internal Medicine | Admitting: Internal Medicine

## 2016-08-27 ENCOUNTER — Ambulatory Visit (INDEPENDENT_AMBULATORY_CARE_PROVIDER_SITE_OTHER): Payer: Medicare Other | Admitting: Internal Medicine

## 2016-08-27 ENCOUNTER — Encounter: Payer: Self-pay | Admitting: Internal Medicine

## 2016-08-27 VITALS — BP 138/76 | HR 75 | Temp 98.5°F | Resp 16 | Wt 126.0 lb

## 2016-08-27 DIAGNOSIS — H9193 Unspecified hearing loss, bilateral: Secondary | ICD-10-CM | POA: Diagnosis not present

## 2016-08-27 DIAGNOSIS — S022XXA Fracture of nasal bones, initial encounter for closed fracture: Secondary | ICD-10-CM | POA: Insufficient documentation

## 2016-08-27 DIAGNOSIS — E0590 Thyrotoxicosis, unspecified without thyrotoxic crisis or storm: Secondary | ICD-10-CM | POA: Insufficient documentation

## 2016-08-27 DIAGNOSIS — S0990XD Unspecified injury of head, subsequent encounter: Secondary | ICD-10-CM | POA: Diagnosis not present

## 2016-08-27 DIAGNOSIS — S8991XD Unspecified injury of right lower leg, subsequent encounter: Secondary | ICD-10-CM | POA: Diagnosis not present

## 2016-08-27 DIAGNOSIS — S022XXD Fracture of nasal bones, subsequent encounter for fracture with routine healing: Secondary | ICD-10-CM

## 2016-08-27 DIAGNOSIS — S8990XA Unspecified injury of unspecified lower leg, initial encounter: Secondary | ICD-10-CM | POA: Insufficient documentation

## 2016-08-27 DIAGNOSIS — S0990XA Unspecified injury of head, initial encounter: Secondary | ICD-10-CM | POA: Insufficient documentation

## 2016-08-27 MED ORDER — SODIUM IODIDE I 131 CAPSULE: 11.0000 | Freq: Once | INTRAVENOUS | Status: DC | PRN

## 2016-08-27 NOTE — Progress Notes (Signed)
Subjective:    Patient ID: Samantha Quinn, female    DOB: 05/04/1927, 83 y.o.   MRN: [id number]  HPI The patient is here for follow up from the hospital.  She went to the ED 08/25/16 after falling in the parking lot on her way to her chemo infusion at the cancer center.  She hit the front of her head and face along with her right hand and right knee.  She did not have LOC.  She had pain and swelling in her face, but otherwise felt ok when she arrived in the ED.  X-rays did reveal a closed fracture of the nasal bone and possible fracture of the third right finger.  The finger fracture she believes is old.  Other imaging was normal.   Her right knee is painful and swollen. She has two small abrasions on the knee.  She has been icing the knee.  She has taken 2-3 advil since the fall, which has helped.  She had b/l hand pain in her fingers from the fall - just bruising.  The right third finger is swollen and tender. Her right posterior hand has a small laceration and is bandaged.  She has mild tenderness from the nasal fracture and on her forehead. She has been icing the head.  She has extensive bruising of the face.   She overall feels ok.  She denies headaches, dizziness or lightheadedness. Her vision is good.   Medications and allergies reviewed with patient and updated if appropriate.  Patient Active Problem List   Diagnosis Date Noted  . Thyrotoxicosis without thyroid storm 06/22/2016  . Fatigue 05/05/2016  . Macular degeneration 03/09/2016  . Encounter for antineoplastic chemotherapy 12/19/2015  . Meralgia paresthetica of left side 07/10/2015  . Osteoporosis 07/10/2015  . Onychomycosis 07/10/2015  . Anxiety 07/10/2015  . Diabetes ([place]) 07/10/2015  . Cough 12/12/2013  . Multinodular goiter (nontoxic) 09/29/2012  . Pneumothorax after biopsy 07/09/2011    Class: Acute  . Primary adenocarcinoma of lower lobe of right lung ([place]) 06/24/2011  . Hypercholesterolemia 10/20/2010  . Essential  hypertension, benign 10/20/2010  . Hypothyroidism (acquired) 10/20/2010  . Osteoarthritis 10/20/2010    Current Outpatient Prescriptions on File Prior to Visit  Medication Sig Dispense Refill  . acetaminophen-codeine (CAPITAL/CODEINE) 120-12 MG/5ML suspension Take 5 mLs by mouth every 6 (six) hours as needed for pain. 60 mL 0  . amitriptyline (ELAVIL) 10 MG tablet Take 1 tablet (10 mg total) by mouth at bedtime as needed. (Patient taking differently: Take 5 mg by mouth at bedtime. ) 90 tablet 1  . bisoprolol-hydrochlorothiazide (ZIAC) 2.5-6.25 MG tablet Take 1 tablet by mouth daily. 90 tablet 3  . Blood Glucose Monitoring Suppl (ONE TOUCH ULTRA 2) w/Device KIT Use to check blood sugars twice a day Dx E11.9 1 each 0  . calcium carbonate (OS-CAL) 600 MG tablet Take 600 mg by mouth daily.    [name] gabapentin (NEURONTIN) 100 MG capsule Take 200 mg by mouth 3 (three) times daily.     [name] glucose blood (ONE TOUCH ULTRA TEST) test strip 1 each by Other route 2 (two) times daily. Use to check blood sugars twice a day 100 each 3  . ibuprofen (ADVIL,MOTRIN) 200 MG tablet Take 200 mg by mouth every 6 (six) hours as needed for fever, headache or mild pain.     . Lancets (ONETOUCH ULTRASOFT) lancets 1 each by Other route 2 (two) times daily. Use to help check blood sugars twice a  day 100 each 3  . lovastatin (MEVACOR) 20 MG tablet Take 1 tablet (20 mg total) by mouth every other day. 90 tablet 1  . metFORMIN (GLUCOPHAGE) 500 MG tablet Take 1 tablet (500 mg total) by mouth 2 (two) times daily with a meal. 180 tablet 3  . methimazole (TAPAZOLE) 5 MG tablet Take 1 tablet (5 mg total) by mouth daily. 60 tablet 1  . ramipril (ALTACE) 2.5 MG capsule Take 1 capsule (2.5 mg total) by mouth daily. 90 capsule 2   No current facility-administered medications on file prior to visit.     Past Medical History:  Diagnosis Date  . Back pain    bulding disc  . Bronchitis    hx of-many,many years   . Diabetes mellitus     . Diabetes mellitus    type 2 and takes Metformin bid  . Dyslipidemia   . Embolism - blood clot    hx of in left lower leg and was on Coumadin for about 38month;this was about 8-168yrago  . Family history of adverse reaction to anesthesia    Daughter- Nausea  . History of colonic polyps   . HOH (hard of hearing)   . Hx of migraines    as a teenager  . Hypercholesterolemia   . Hyperlipidemia    takes Lovastatin every other day  . Hypertension    takes Ziac daily and Ramipril as well  . Hypothyroidism    takes Synthroid every other day  . Insomnia    takes Elavil nightly  . Joint pain   . Lung mass   . Myalgia   . Osteoarthritis   . Osteoporosis   . Pneumothorax of right lung after biopsy 07/09/11   HAD CHEST TUBE PLACEMENT  . PONV (postoperative nausea and vomiting)   . Primary adenocarcinoma of [place] lobe of right lung ([facility] [place]2/27/2013   pT2a, pN0 M0 Stage 1 Well differenced Adenocarcinoma 3.5 cm resected 07/17/2011  . Primary adenocarcinoma of lung ([facility]/27/2013  . Shortness of breath dyspnea    With exertion  . Skin cancer    legs have dark spots on them  . Upper respiratory infection 04/2011  . Urinary incontinence    takes Detrol daily prn    Past Surgical History:  Procedure Laterality Date  . ABDOMINAL HYSTERECTOMY    . APPENDECTOMY     as a child  . CHEST TUBE REMOVAL  07/13/11   RIGHT  CHEST TUBE  . CHOLECYSTECTOMY    . COLONOSCOPY    . COLONOSCOPY    . DILATION AND CURETTAGE OF UTERUS     x 2  . EYE SURGERY Bilateral    Cataract  . HEMORRHOID SURGERY    . LUNG BIOPSY  07/09/11   RIGHT UPER LOBE LUNG MASS   . RIGHT CHEST TUBE PLACEMENT  07/10/11   S/P LUNG BX  . ROTATOR CUFF REPAIR     bilateral  . TOE SURGERY  2013   right small toe  . TONSILLECTOMY     as a child  . VIDEO BRONCHOSCOPY  07/17/2011   Procedure: VIDEO BRONCHOSCOPY;  Surgeon: EdGrace IsaacMD;  [facility];  Laterality: N/A;  . VIDEO BRONCHOSCOPY WITH  ENDOBRONCHIAL ULTRASOUND N/A 12/10/2015   Procedure: VIDEO BRONCHOSCOPY WITH ENDOBRONCHIAL ULTRASOUND with biopsies.;  Surgeon: EdGrace IsaacMD;  [facility];  Laterality: N/A;    Social History   Social History  . Marital  status: Widowed    Spouse name: N/A  . Number of children: N/A  . Years of education: N/A   Social History Main Topics  . Smoking status: Never Smoker  . Smokeless tobacco: Never Used  . Alcohol use No  . Drug use: No  . Sexual activity: Yes    Birth control/ protection: Surgical   Other Topics Concern  . None   Social History Narrative  . None    Family History  Problem Relation Age of Onset  . Hypertension Mother   . Hypertension Father   . Arthritis Sister   . Arthritis Sister   . Diabetes Brother   . Hypertension Sister   . Anesthesia problems Neg Hx   . Hypotension Neg Hx   . Malignant hyperthermia Neg Hx   . Pseudochol deficiency Neg Hx   . Heart attack Neg Hx     Review of Systems  Constitutional: Negative for chills and fever.  Eyes: Negative for visual disturbance.  Respiratory: Negative for shortness of breath.   Cardiovascular: Negative for chest pain.  Gastrointestinal: Negative for nausea.  Musculoskeletal: Positive for arthralgias and joint swelling.  Neurological: Positive for tremors. Negative for dizziness, light-headedness and headaches.       Objective:   Vitals:   08/27/16 1003  BP: 138/76  Pulse: 75  Resp: 16  Temp: 98.5 F (36.9 C)   Wt Readings from Last 3 Encounters:  08/27/16 126 lb (57.2 kg)  08/25/16 126 lb (57.2 kg)  08/05/16 127 lb 12.8 oz (58 kg)   Body mass index is 23.81 kg/m.   Physical Exam    Constitutional: Appears well-developed and well-nourished. No distress.  HENT:  Head: Normocephalic; extensive orbital ecchymosis, lump in forehead  With mild bruising - mildly tender, mild bruising in lower face  Neck: closed nasal fracture,  Neck supple. No tracheal  deviation present. No thyromegaly present.  No cervical lymphadenopathy Cardiovascular: Normal rate, regular rhythm and normal heart sounds.   No murmur heard. No carotid bruit .  No edema Pulmonary/Chest: Effort normal and breath sounds normal. No respiratory distress. No has no wheezes. No rales.  Msk: right knee with effusion, two small skin lacerations - starting to heal, no evidence of infection - no discharge Skin: Skin is warm and dry. Not diaphoretic.  bruising left fingers, small laceration right posterior hand  - see through bandage in place - no surrounding swelling or redness Psychiatric: Normal mood and affect. Behavior is normal.    DG Hand Complete Right CLINICAL DATA:  Tripped. Fall. Laceration to right hand. Pain and middle and ring finger.  EXAM: RIGHT HAND - COMPLETE 3+ VIEW  COMPARISON:  None.  FINDINGS: The bones appear diffusely osteopenic. Scattered degenerative changes are noted. This is most advanced at the second PIP joint. Small, age indeterminate appearing fracture deformity involving the tuft of the third distal phalanx is identified.  IMPRESSION: 1. There is a small age indeterminate fracture arising from the tuft of the third distal phalanx. 2. Osteopenia 3. Degenerative changes.  Electronically Signed   By: Kerby Moors M.D.   On: 08/25/2016 10:24 DG Knee Complete 4 Views Right CLINICAL DATA:  Status post trip and fall over a curb in a parking lot yesterday with onset of left knee pain. Initial encounter.  EXAM: RIGHT KNEE - COMPLETE 4+ VIEW  COMPARISON:  None.  FINDINGS: No evidence of fracture, dislocation, or joint effusion. No evidence of arthropathy or other focal bone abnormality. Soft tissues are  unremarkable.  IMPRESSION: Negative exam.  Electronically Signed   By: Inge Rise M.D.   On: 08/25/2016 10:21 CT Maxillofacial Wo Contrast CLINICAL DATA:  Status post fall. Multiple skin tears. Hematoma toe forehead. No  loss of consciousness.  EXAM: CT HEAD WITHOUT CONTRAST  CT MAXILLOFACIAL WITHOUT CONTRAST  TECHNIQUE: Multidetector CT imaging of the head and maxillofacial structures were performed using the standard protocol without intravenous contrast. Multiplanar CT image reconstructions of the maxillofacial structures were also generated.  COMPARISON:  None.  FINDINGS: CT HEAD FINDINGS  Brain: No evidence of acute infarction, hemorrhage, extra-axial collection, ventriculomegaly, or mass effect. Generalized cerebral atrophy.  Vascular: Cerebrovascular atherosclerotic calcifications are noted.  Skull: Negative for fracture or focal lesion.  Sinuses/Orbits: Visualized portions of the orbits are unremarkable. Visualized portions of the paranasal sinuses and mastoid air cells are unremarkable.  Other: Left frontal scalp hematoma.  CT MAXILLOFACIAL FINDINGS  Osseous: Bilateral comminuted nasal bone fractures with mild displacement. No other acute fracture or dislocation. Temporomandibular joints are congruent. Mild osteoarthritis of bilateral temporomandibular joints. No aggressive osseous lesion.  Orbits: Negative. No traumatic or inflammatory finding.  Sinuses: Clear.  Soft tissues: Soft tissue swelling over the nasal bone with soft tissue emphysema.  IMPRESSION: 1. No acute intracranial pathology. 2. Comminuted bilateral nasal bone fractures with mild displacement and overlying soft tissue swelling and soft tissue emphysema. 3. Left frontal scalp hematoma.  Electronically Signed   By: Kathreen Devoid   On: 08/25/2016 10:07 CT Head Wo Contrast CLINICAL DATA:  Status post fall. Multiple skin tears. Hematoma toe forehead. No loss of consciousness.  EXAM: CT HEAD WITHOUT CONTRAST  CT MAXILLOFACIAL WITHOUT CONTRAST  TECHNIQUE: Multidetector CT imaging of the head and maxillofacial structures were performed using the standard protocol without intravenous contrast.  Multiplanar CT image reconstructions of the maxillofacial structures were also generated.  COMPARISON:  None.  FINDINGS: CT HEAD FINDINGS  Brain: No evidence of acute infarction, hemorrhage, extra-axial collection, ventriculomegaly, or mass effect. Generalized cerebral atrophy.  Vascular: Cerebrovascular atherosclerotic calcifications are noted.  Skull: Negative for fracture or focal lesion.  Sinuses/Orbits: Visualized portions of the orbits are unremarkable. Visualized portions of the paranasal sinuses and mastoid air cells are unremarkable.  Other: Left frontal scalp hematoma.  CT MAXILLOFACIAL FINDINGS  Osseous: Bilateral comminuted nasal bone fractures with mild displacement. No other acute fracture or dislocation. Temporomandibular joints are congruent. Mild osteoarthritis of bilateral temporomandibular joints. No aggressive osseous lesion.  Orbits: Negative. No traumatic or inflammatory finding.  Sinuses: Clear.  Soft tissues: Soft tissue swelling over the nasal bone with soft tissue emphysema.  IMPRESSION: 1. No acute intracranial pathology. 2. Comminuted bilateral nasal bone fractures with mild displacement and overlying soft tissue swelling and soft tissue emphysema. 3. Left frontal scalp hematoma.  Electronically Signed   By: Kathreen Devoid   On: 08/25/2016 10:07     Assessment & Plan:    See Problem List for Assessment and Plan of chronic medical problems.

## 2016-08-27 NOTE — Assessment & Plan Note (Signed)
With effusion and two small lacerations on knee Local wound care Call if no improvement ice

## 2016-08-27 NOTE — Assessment & Plan Note (Signed)
Needs referral for hearing aid check - ordered

## 2016-08-27 NOTE — Progress Notes (Signed)
Pre visit review using our clinic review tool, if applicable. No additional management support is needed unless otherwise documented below in the visit note. 

## 2016-08-27 NOTE — Assessment & Plan Note (Signed)
Ct showed no internal bleeding No concerning symptoms of a concussion Icing head - will continue She will call if she develops any concerning symptoms Will f/u with ENT for nasal fx

## 2016-08-27 NOTE — Patient Instructions (Signed)
Continue local wound care.  Call with any questions or concerns.

## 2016-08-27 NOTE — Assessment & Plan Note (Signed)
Minimal pain Has follow up with ENT

## 2016-08-28 ENCOUNTER — Telehealth: Payer: Self-pay

## 2016-08-28 ENCOUNTER — Encounter (HOSPITAL_COMMUNITY)
Admission: RE | Admit: 2016-08-28 | Discharge: 2016-08-28 | Disposition: A | Discharge status: Home / Self Care | Payer: Medicare Other | Source: Ambulatory Visit | Attending: Internal Medicine | Admitting: Internal Medicine

## 2016-08-28 ENCOUNTER — Telehealth: Payer: Self-pay | Admitting: *Deleted

## 2016-08-28 DIAGNOSIS — E041 Nontoxic single thyroid nodule: Secondary | ICD-10-CM | POA: Diagnosis not present

## 2016-08-28 DIAGNOSIS — E0590 Thyrotoxicosis, unspecified without thyrotoxic crisis or storm: Secondary | ICD-10-CM | POA: Diagnosis not present

## 2016-08-28 MED ORDER — SODIUM PERTECHNETATE TC 99M INJECTION: 10.1000 | Freq: Once | INTRAVENOUS | Status: DC | PRN

## 2016-08-28 NOTE — Telephone Encounter (Signed)
Patient called wanting to know if she needs to take the methimazole because she has finished her treatment patient is requesting a call back today by 5:00 so she will know what to do over the weekend. Please call patient [phone]

## 2016-08-28 NOTE — Telephone Encounter (Signed)
Called and advised patient of to continue to take the Methimazole, after speaking with Dr.Gherghe regarding her uptake and scan.

## 2016-08-31 ENCOUNTER — Other Ambulatory Visit: Payer: Self-pay | Admitting: Internal Medicine

## 2016-09-02 DIAGNOSIS — S022XXD Fracture of nasal bones, subsequent encounter for fracture with routine healing: Secondary | ICD-10-CM | POA: Diagnosis not present

## 2016-09-07 ENCOUNTER — Other Ambulatory Visit: Payer: Self-pay | Admitting: Internal Medicine

## 2016-09-08 ENCOUNTER — Other Ambulatory Visit (HOSPITAL_BASED_OUTPATIENT_CLINIC_OR_DEPARTMENT_OTHER): Payer: Medicare Other

## 2016-09-08 ENCOUNTER — Ambulatory Visit (HOSPITAL_BASED_OUTPATIENT_CLINIC_OR_DEPARTMENT_OTHER): Payer: Medicare Other | Admitting: Hematology & Oncology

## 2016-09-08 ENCOUNTER — Ambulatory Visit (HOSPITAL_BASED_OUTPATIENT_CLINIC_OR_DEPARTMENT_OTHER): Payer: Medicare Other

## 2016-09-08 VITALS — BP 133/57 | HR 75 | Temp 98.2°F | Resp 16 | Wt 128.0 lb

## 2016-09-08 DIAGNOSIS — C3431 Malignant neoplasm of lower lobe, right bronchus or lung: Secondary | ICD-10-CM

## 2016-09-08 DIAGNOSIS — Z5112 Encounter for antineoplastic immunotherapy: Secondary | ICD-10-CM | POA: Diagnosis present

## 2016-09-08 DIAGNOSIS — R053 Chronic cough: Secondary | ICD-10-CM

## 2016-09-08 DIAGNOSIS — R05 Cough: Secondary | ICD-10-CM

## 2016-09-08 LAB — CBC WITH DIFFERENTIAL (CANCER CENTER ONLY)
BASO#: 0 10*3/uL (ref 0.0–0.2)
BASO%: 0.5 % (ref 0.0–2.0)
EOS ABS: 0.1 10*3/uL (ref 0.0–0.5)
EOS%: 1.7 % (ref 0.0–7.0)
HEMATOCRIT: 38.1 % (ref 34.8–46.6)
HEMOGLOBIN: 12.4 g/dL (ref 11.6–15.9)
LYMPH#: 0.5 10*3/uL — AB (ref 0.9–3.3)
LYMPH%: 8.7 % — ABNORMAL LOW (ref 14.0–48.0)
MCH: 27.1 pg (ref 26.0–34.0)
MCHC: 32.5 g/dL (ref 32.0–36.0)
MCV: 83 fL (ref 81–101)
MONO#: 0.4 10*3/uL (ref 0.1–0.9)
MONO%: 6.8 % (ref 0.0–13.0)
NEUT%: 82.3 % — ABNORMAL HIGH (ref 39.6–80.0)
NEUTROS ABS: 4.7 10*3/uL (ref 1.5–6.5)
Platelets: 262 10*3/uL (ref 145–400)
RBC: 4.58 10*6/uL (ref 3.70–5.32)
RDW: 16.3 % — ABNORMAL HIGH (ref 11.1–15.7)
WBC: 5.8 10*3/uL (ref 3.9–10.0)

## 2016-09-08 LAB — CMP (CANCER CENTER ONLY)
ALBUMIN: 3.6 g/dL (ref 3.3–5.5)
ALK PHOS: 87 U/L — AB (ref 26–84)
ALT(SGPT): 26 U/L (ref 10–47)
AST: 21 U/L (ref 11–38)
BILIRUBIN TOTAL: 0.7 mg/dL (ref 0.20–1.60)
BUN: 20 mg/dL (ref 7–22)
CALCIUM: 9.7 mg/dL (ref 8.0–10.3)
CO2: 28 mEq/L (ref 18–33)
Chloride: 104 mEq/L (ref 98–108)
Creat: 0.9 mg/dl (ref 0.6–1.2)
GLUCOSE: 146 mg/dL — AB (ref 73–118)
POTASSIUM: 3.5 meq/L (ref 3.3–4.7)
Sodium: 140 mEq/L (ref 128–145)
Total Protein: 7.1 g/dL (ref 6.4–8.1)

## 2016-09-08 LAB — LACTATE DEHYDROGENASE: LDH: 182 U/L (ref 125–245)

## 2016-09-08 MED ORDER — ATEZOLIZUMAB CHEMO INJECTION 1200 MG/20ML
1200.0000 mg | Freq: Once | INTRAVENOUS | Status: AC
  Administered 2016-09-08: 1200 mg via INTRAVENOUS
  Filled 2016-09-08: qty 20

## 2016-09-08 MED ORDER — SODIUM CHLORIDE 0.9 % IV SOLN
Freq: Once | INTRAVENOUS | Status: AC
  Administered 2016-09-08: 09:00:00 via INTRAVENOUS

## 2016-09-08 NOTE — Progress Notes (Signed)
Counts reviewed with Dr. Marin Olp. OK to treat

## 2016-09-08 NOTE — Patient Instructions (Signed)
Atezolizumab injection What is this medicine? ATEZOLIZUMAB (a te zoe LIZ ue mab) is a monoclonal antibody. It is used to treat bladder cancer (urothelial cancer) and non-small cell lung cancer. This medicine may be used for other purposes; ask your health care provider or pharmacist if you have questions. COMMON BRAND NAME(S): Tecentriq What should I tell my health care provider before I take this medicine? They need to know if you have any of these conditions: -diabetes -immune system problems -infection -inflammatory bowel disease -liver disease -lung or breathing disease -lupus -nervous system problems like myasthenia gravis or Guillain-Barre syndrome -organ transplant -an unusual or allergic reaction to atezolizumab, other medicines, foods, dyes, or preservatives -pregnant or trying to get pregnant -breast-feeding How should I use this medicine? This medicine is for infusion into a vein. It is given by a health care professional in a hospital or clinic setting. A special MedGuide will be given to you before each treatment. Be sure to read this information carefully each time. Talk to your pediatrician regarding the use of this medicine in children. Special care may be needed. Overdosage: If you think you have taken too much of this medicine contact a poison control center or emergency room at once. NOTE: This medicine is only for you. Do not share this medicine with others. What if I miss a dose? It is important not to miss your dose. Call your doctor or health care professional if you are unable to keep an appointment. What may interact with this medicine? Interactions have not been studied. This list may not describe all possible interactions. Give your health care provider a list of all the medicines, herbs, non-prescription drugs, or dietary supplements you use. Also tell them if you smoke, drink alcohol, or use illegal drugs. Some items may interact with your medicine. What  should I watch for while using this medicine? Your condition will be monitored carefully while you are receiving this medicine. You may need blood work done while you are taking this medicine. Do not become pregnant while taking this medicine or for at least 5 months after stopping it. Women should inform their doctor if they wish to become pregnant or think they might be pregnant. There is a potential for serious side effects to an unborn child. Talk to your health care professional or pharmacist for more information. Do not breast-feed an infant while taking this medicine or for at least 5 months after the last dose. What side effects may I notice from receiving this medicine? Side effects that you should report to your doctor or health care professional as soon as possible: -allergic reactions like skin rash, itching or hives, swelling of the face, lips, or tongue -black, tarry stools -bloody or watery diarrhea -breathing problems -changes in vision -chest pain or chest tightness -chills -facial flushing -fever -headache -signs and symptoms of high blood sugar such as dizziness; dry mouth; dry skin; fruity breath; nausea; stomach pain; increased hunger or thirst; increased urination -signs and symptoms of liver injury like dark yellow or brown urine; general ill feeling or flu-like symptoms; light-colored stools; loss of appetite; nausea; right upper belly pain; unusually weak or tired; yellowing of the eyes or skin -stomach pain -trouble passing urine or change in the amount of urine Side effects that usually do not require medical attention (report to your doctor or health care professional if they continue or are bothersome): -cough -diarrhea -joint pain -muscle pain -muscle weakness -tiredness -weight loss This list may not describe all   possible side effects. Call your doctor for medical advice about side effects. You may report side effects to FDA at 1-800-FDA-1088. Where should  I keep my medicine? This drug is given in a hospital or clinic and will not be stored at home. NOTE: This sheet is a summary. It may not cover all possible information. If you have questions about this medicine, talk to your doctor, pharmacist, or health care provider.  2018 Elsevier/Gold Standard (2015-05-15 17:54:14)  

## 2016-09-08 NOTE — Progress Notes (Signed)
Hematology and Oncology Follow Up Visit  Samantha Quinn [id number] 1927/09/10 83 y.o. 09/08/2016   Principle Diagnosis:   Stage IV adenocarcinoma of the right lung-mutation negative  Current Therapy:    Tecentriq 1200 mg IV every 3 weeks s/p cycle #2  Status post radiation/chemotherapy for recurrence-completed October 2017  Status post right upper lobectomy in March 2013 for stage IB disease     Interim History:  Samantha Quinn is back for follow-up. She missed her last cycle 2 weeks ago because she fell. She fell I think her driveway. Frankly, she did not break a hip or leg. She fell on her face. She broke her nose. She has some ecchymosis on the left side of her face but these seem to be resolving.  She did not pass out. She did not get dizzy. She just lost her balance a little bit.  She does have trouble pain over on the left lateral rib. I'm sure she probably cracked a rib.  Her appetite is good. She is watching her blood sugars. Her blood sugar this morning was 120 at home.  She's had no change in bowel or bladder habits. She's had no leg swelling outside of the right knee when she fell.  She sees her dermatologist routinely. I told her that if the dermatologist needed to do any studies or any biopsies, that there would not be a problem with her doing this.  Overall, her performance status is ECOG 2.  Medications:  Current Outpatient Prescriptions:  .  amitriptyline (ELAVIL) 10 MG tablet, Take 1 tablet (10 mg total) by mouth at bedtime as needed. (Patient taking differently: Take 5 mg by mouth at bedtime. ), Disp: 90 tablet, Rfl: 1 .  bisoprolol-hydrochlorothiazide (ZIAC) 2.5-6.25 MG tablet, TAKE 1 TABLET DAILY, Disp: 90 tablet, Rfl: 3 .  Blood Glucose Monitoring Suppl (ONE TOUCH ULTRA 2) w/Device KIT, Use to check blood sugars twice a day Dx E11.9, Disp: 1 each, Rfl: 0 .  calcium carbonate (OS-CAL) 600 MG tablet, Take 600 mg by mouth daily., Disp: , Rfl:  .  gabapentin  (NEURONTIN) 100 MG capsule, Take 200 mg by mouth 3 (three) times daily. , Disp: , Rfl:  .  glucose blood (ONE TOUCH ULTRA TEST) test strip, 1 each by Other route 2 (two) times daily. Use to check blood sugars twice a day, Disp: 100 each, Rfl: 3 .  ibuprofen (ADVIL,MOTRIN) 200 MG tablet, Take 200 mg by mouth every 6 (six) hours as needed for fever, headache or mild pain. , Disp: , Rfl:  .  Lancets (ONETOUCH ULTRASOFT) lancets, 1 each by Other route 2 (two) times daily. Use to help check blood sugars twice a day, Disp: 100 each, Rfl: 3 .  lovastatin (MEVACOR) 20 MG tablet, Take 1 tablet (20 mg total) by mouth every other day., Disp: 90 tablet, Rfl: 1 .  metFORMIN (GLUCOPHAGE) 500 MG tablet, Take 1 tablet (500 mg total) by mouth 2 (two) times daily with a meal., Disp: 180 tablet, Rfl: 3 .  methimazole (TAPAZOLE) 5 MG tablet, Take 1 tablet (5 mg total) by mouth daily., Disp: 60 tablet, Rfl: 1 .  ramipril (ALTACE) 2.5 MG capsule, TAKE 1 CAPSULE DAILY, Disp: 90 capsule, Rfl: 1  Allergies:  Allergies  Allergen Reactions  . Codeine Nausea And Vomiting  . Tramadol Other (See Comments)    syncope  . Demerol Nausea Only    Past Medical History, Surgical history, Social history, and Family History were reviewed and updated.  Review of Systems: As above  Physical Exam:  weight is 128 lb (58.1 kg). Her oral temperature is 98.2 F (36.8 C). Her blood pressure is 133/57 (abnormal) and her pulse is 75. Her respiration is 16 and oxygen saturation is 97%.   Wt Readings from Last 3 Encounters:  09/08/16 128 lb (58.1 kg)  08/27/16 126 lb (57.2 kg)  08/25/16 126 lb (57.2 kg)     Elderly but well-nourished white female in no obvious distress. Head and neck exam shows no ocular or oral lesions. She has no palpable cervical or supraclavicular lymph nodes. Lungs show clear breath sounds bilaterally. She has good air movement bilaterally. Cardiac exam regular rate and rhythm with no murmurs, rubs or bruits.  Abdomen is soft. She has decent bowel sounds. There is no fluid wave. There is no palpable liver or spleen tip. Externally shows no clubbing, cyanosis or edema. She has good range of motion of her joints. She has good strength. Skin exam shows no rashes, ecchymoses or petechia. Neurological exam shows no focal neurological deficits.  Lab Results  Component Value Date   WBC 5.8 09/08/2016   HGB 12.4 09/08/2016   HCT 38.1 09/08/2016   MCV 83 09/08/2016   PLT 262 09/08/2016     Chemistry      Component Value Date/Time   NA 140 08/05/2016 1251   NA 139 06/08/2016 1316   K 3.6 08/05/2016 1251   K 3.7 06/08/2016 1316   CL 99 08/05/2016 1251   CO2 28 08/05/2016 1251   CO2 24 06/08/2016 1316   BUN 15 08/05/2016 1251   BUN 13.3 06/08/2016 1316   CREATININE 0.7 08/05/2016 1251   CREATININE 0.7 06/08/2016 1316      Component Value Date/Time   CALCIUM 9.5 08/05/2016 1251   CALCIUM 9.8 06/08/2016 1316   ALKPHOS 76 08/05/2016 1251   ALKPHOS 79 06/08/2016 1316   AST 23 08/05/2016 1251   AST 14 06/08/2016 1316   ALT 21 08/05/2016 1251   ALT 11 06/08/2016 1316   BILITOT 0.60 08/05/2016 1251   BILITOT 0.25 06/08/2016 1316         Impression and Plan: Samantha Quinn is a 83 year old white female. She has recurrent non-small cell lung cancer. This appears to be adenocarcinoma. She does not have any mutations that we can target.  We'll proceed with her 3rd cycle of treatment. I think she has tolerated this quite well.  We will go ahead and plan for a total of 4 cycles before we reassess her. I think after 4 cycles, we will clearly no if this is working. If it is not working, then I think she will be very comfortable being referred to hospice.  I must say, that she really looks quite good despite having this fall. This was 2 weeks ago today.  We'll plan to get her back in 3 weeks. Again after her fourth cycle, we will rescan her.    [name], MD 5/15/20188:17 AM

## 2016-09-09 ENCOUNTER — Ambulatory Visit: Payer: Medicare Other | Admitting: Internal Medicine

## 2016-09-10 DIAGNOSIS — H903 Sensorineural hearing loss, bilateral: Secondary | ICD-10-CM | POA: Diagnosis not present

## 2016-09-15 ENCOUNTER — Ambulatory Visit: Payer: Medicare Other | Admitting: Hematology & Oncology

## 2016-09-15 ENCOUNTER — Ambulatory Visit: Payer: Medicare Other

## 2016-09-15 ENCOUNTER — Other Ambulatory Visit: Payer: Medicare Other

## 2016-09-16 ENCOUNTER — Ambulatory Visit (INDEPENDENT_AMBULATORY_CARE_PROVIDER_SITE_OTHER): Payer: Medicare Other | Admitting: Internal Medicine

## 2016-09-16 ENCOUNTER — Encounter: Payer: Self-pay | Admitting: Internal Medicine

## 2016-09-16 VITALS — BP 118/74 | HR 76 | Wt 127.0 lb

## 2016-09-16 DIAGNOSIS — E042 Nontoxic multinodular goiter: Secondary | ICD-10-CM | POA: Diagnosis not present

## 2016-09-16 DIAGNOSIS — E0520 Thyrotoxicosis with toxic multinodular goiter without thyrotoxic crisis or storm: Secondary | ICD-10-CM | POA: Diagnosis not present

## 2016-09-16 LAB — TSH: TSH: 0.56 u[IU]/mL (ref 0.35–4.50)

## 2016-09-16 LAB — T3, FREE: T3 FREE: 3.1 pg/mL (ref 2.3–4.2)

## 2016-09-16 LAB — T4, FREE: Free T4: 0.55 ng/dL — ABNORMAL LOW (ref 0.60–1.60)

## 2016-09-16 NOTE — Patient Instructions (Addendum)
Please continue: - Methimazole 5 mg daily  Please come back in 4 months.

## 2016-09-16 NOTE — Progress Notes (Signed)
Patient ID: Samantha Quinn, female   DOB: Sep 05, 1927, 83 y.o.   MRN: [id number]   HPI  Samantha Quinn is a 83 y.o.-year-old female, returning for follow-up for thyrotoxicosis. Visit 3 months ago.  She fell since last visit >> broke nose >> recovered.   Patient has a history of lung cancer and has had radiotherapy and now on immunotherapy. She will have a CT scan again after June 2018.  Reviewed and addended history: She also has a history of thyroiditis with subsequent hypothyroidism and was on levothyroxine for many years. However, the TSH study to be suppressed and her levothyroxine dose continued to be decreased until this was stopped ~ 6 mo ago.  I reviewed pt's thyroid tests: Lab Results  Component Value Date   TSH 0.12 (L) 08/12/2016   TSH 0.02 (L) 06/17/2016   TSH 0.02 (L) 05/05/2016   TSH <0.080 (L) 01/27/2016   TSH 0.01 (L) 11/12/2015   TSH 0.01 (L) 07/10/2015   TSH <0.008 (L) 03/08/2015   TSH 0.02 (L) 09/25/2014   TSH 0.03 (L) 04/11/2014   TSH 0.01 (L) 08/18/2013   FREET4 0.46 (L) 08/12/2016   FREET4 0.81 06/17/2016   FREET4 1.09 05/05/2016   FREET4 1.32 11/12/2015   FREET4 1.47 07/10/2015   FREET4 1.60 03/08/2015   FREET4 1.42 08/18/2013   FREET4 1.25 09/29/2012   FREET4 1.08 09/09/2011   FREET4 1.24 02/27/2011    Lab Results  Component Value Date   TSI <89 06/17/2016   She has a history of thyroid nodules, with a thyroid ultrasound in 08/29/2013 and the subsequent biopsy of the dominant right mid nodule measuring 3.1 x 1.9 x 2.1 cm. This was benign.  At last visit, and 05/2016, we discussed about checking a thyroid uptake and scan but we could not do this at that time, because she just had a CT scan with contrast. We were able to check this on 08/28/2016. Her uptake was elevated, at 40%, and the scan was positive for multinodular goiter.  After the results returned, we discussed about starting methimazole low-dose until she returned for a visit to discuss treatment  further. She continues on 5 mg daily of methimazole. She tells me she does not feel different on the med.  Pt denies: - feeling nodules in neck - hoarseness - dysphagia - choking - SOB with lying down  Pt does have a FH of thyroid ds.: mother - thyroid removed; sister - ?. No h/o radiation tx to head or neck. Had RxTx  - lung. No FH of thyroid cancer. No seaweed or kelp. No recent contrast studies. No herbal supplements. No Biotin use. No recent steroids use.   ROS: Constitutional: no weight gain/no weight loss, no fatigue, + subjective hyperthermia - + hot flushes Eyes: no blurry vision, no xerophthalmia ENT: no sore throat, no nodules palpated in throat, no dysphagia, no odynophagia, no hoarseness Cardiovascular: no CP/no SOB/no palpitations/no leg swelling Respiratory: + cough/no SOB/no wheezing Gastrointestinal: no N/no V/no D/no C/no acid reflux Musculoskeletal: no muscle aches/+ joint aches Skin: no rashes, no hair loss Neurological: no tremors/no numbness/no tingling/no dizziness  I reviewed pt's medications, allergies, PMH, social hx, family hx, and changes were documented in the history of present illness. Otherwise, unchanged from my initial visit note.  Past Medical History:  Diagnosis Date  . Back pain    bulding disc  . Bronchitis    hx of-many,many years   . Diabetes mellitus   . Diabetes mellitus  type 2 and takes Metformin bid  . Dyslipidemia   . Embolism - blood clot    hx of in left lower leg and was on Coumadin for about 58month;this was about 8-160yrago  . Family history of adverse reaction to anesthesia    Daughter- Nausea  . History of colonic polyps   . HOH (hard of hearing)   . Hx of migraines    as a teenager  . Hypercholesterolemia   . Hyperlipidemia    takes Lovastatin every other day  . Hypertension    takes Ziac daily and Ramipril as well  . Hypothyroidism    takes Synthroid every other day  . Insomnia    takes Elavil nightly  .  Joint pain   . Lung mass   . Myalgia   . Osteoarthritis   . Osteoporosis   . Pneumothorax of right lung after biopsy 07/09/11   HAD CHEST TUBE PLACEMENT  . PONV (postoperative nausea and vomiting)   . [place] of [place]   pT2a, pN0 M0 Stage 1 Well differenced Adenocarcinoma 3.5 cm resected 07/17/2011  . Primary adenocarcinoma of lung ([facility]  . Shortness of breath dyspnea    With exertion  . Skin cancer    legs have dark spots on them  . Upper respiratory infection 04/2011  . Urinary incontinence    takes Detrol daily prn   Past Surgical History:  Procedure Laterality Date  . ABDOMINAL HYSTERECTOMY    . APPENDECTOMY     as a child  . CHEST TUBE REMOVAL  07/13/11   RIGHT  CHEST TUBE  . CHOLECYSTECTOMY    . COLONOSCOPY    . COLONOSCOPY    . DILATION AND CURETTAGE OF UTERUS     x 2  . EYE SURGERY Bilateral    Cataract  . HEMORRHOID SURGERY    . LUNG BIOPSY  07/09/11   RIGHT UPER LOBE LUNG MASS   . RIGHT CHEST TUBE PLACEMENT  07/10/11   S/P LUNG BX  . ROTATOR CUFF REPAIR     bilateral  . TOE SURGERY  2013   right small toe  . TONSILLECTOMY     as a child  . VIDEO BRONCHOSCOPY  07/17/2011   Procedure: VIDEO BRONCHOSCOPY;  Surgeon: EdGrace IsaacMD;  [facility];  Laterality: N/A;  . VIDEO BRONCHOSCOPY WITH ENDOBRONCHIAL ULTRASOUND N/A 12/10/2015   Procedure: VIDEO BRONCHOSCOPY WITH ENDOBRONCHIAL ULTRASOUND with biopsies.;  Surgeon: EdGrace IsaacMD;  [facility];  Laterality: N/A;   Social History   Social History  . Marital status: Widowed    Spouse name: N/A  . Number of children: 3   Occupational History  . homemaker   Social History Main Topics  . Smoking status: Never Smoker  . Smokeless tobacco: Never Used  . Alcohol use No  . Drug use: No   Current Outpatient Prescriptions on File Prior to Visit  Medication Sig Dispense Refill  . amitriptyline  (ELAVIL) 10 MG tablet Take 1 tablet (10 mg total) by mouth at bedtime as needed. (Patient taking differently: Take 5 mg by mouth at bedtime. ) 90 tablet 1  . bisoprolol-hydrochlorothiazide (ZIAC) 2.5-6.25 MG tablet TAKE 1 TABLET DAILY 90 tablet 3  . Blood Glucose Monitoring Suppl (ONE TOUCH ULTRA 2) w/Device KIT Use to check blood sugars twice a day Dx E11.9 1 each 0  . calcium carbonate (OS-CAL) 600 MG  tablet Take 600 mg by mouth daily.    [name] gabapentin (NEURONTIN) 100 MG capsule Take 200 mg by mouth 3 (three) times daily.     [name] glucose blood (ONE TOUCH ULTRA TEST) test strip 1 each by Other route 2 (two) times daily. Use to check blood sugars twice a day 100 each 3  . ibuprofen (ADVIL,MOTRIN) 200 MG tablet Take 200 mg by mouth every 6 (six) hours as needed for fever, headache or mild pain.     . Lancets (ONETOUCH ULTRASOFT) lancets 1 each by Other route 2 (two) times daily. Use to help check blood sugars twice a day 100 each 3  . lovastatin (MEVACOR) 20 MG tablet Take 1 tablet (20 mg total) by mouth every other day. 90 tablet 1  . metFORMIN (GLUCOPHAGE) 500 MG tablet Take 1 tablet (500 mg total) by mouth 2 (two) times daily with a meal. 180 tablet 3  . methimazole (TAPAZOLE) 5 MG tablet Take 1 tablet (5 mg total) by mouth daily. 60 tablet 1  . ramipril (ALTACE) 2.5 MG capsule TAKE 1 CAPSULE DAILY 90 capsule 1   No current facility-administered medications on file prior to visit.    Allergies  Allergen Reactions  . Codeine Nausea And Vomiting  . Tramadol Other (See Comments)    syncope  . Demerol Nausea Only   Family History  Problem Relation Age of Onset  . Hypertension Mother   . Hypertension Father   . Arthritis Sister   . Arthritis Sister   . Diabetes Brother   . Hypertension Sister   . Anesthesia problems Neg Hx   . Hypotension Neg Hx   . Malignant hyperthermia Neg Hx   . Pseudochol deficiency Neg Hx   . Heart attack Neg Hx    PE: BP 118/74 (BP Location: Left Arm, Patient  Position: Sitting)   Pulse 76   Wt 127 lb (57.6 kg)   SpO2 96%   BMI 24.00 kg/m   Wt Readings from Last 3 Encounters:  09/16/16 127 lb (57.6 kg)  09/08/16 128 lb (58.1 kg)  08/27/16 126 lb (57.2 kg)   Constitutional: overweight, in NAD Eyes: PERRLA, EOMI, no exophthalmos ENT: moist mucous membranes, + large R thyroid nodule, mobile, no cervical lymphadenopathy Cardiovascular: RRR, No MRG Respiratory: CTA B Gastrointestinal: abdomen soft, NT, ND, BS+ Musculoskeletal: no deformities, strength intact in all 4 Skin: moist, warm, no rashes Neurological: no tremor with outstretched hands, DTR normal in all 4  ASSESSMENT: 1. Thyrotoxicosis - h/o hypothyroidism  PLAN:  1. Patient with a long h/o low TSH, previously on LT4, but with persistant suppressed TSH despite decreasing and eventually stopping the LT4. TSI Ab's were not elevated to suggest Graves ds. A thyroid uptake and scan indicated TMNG. We reviewed all the results today with pt and her daughter in law.  - we discussed about possible treatments for TMNG >> MI (however, this is not curative), RAI tx, surgery (thyroidectomy). I suggested to stay on the [place] for now until she finishes with the ChTx and has her restaging CT scan and then wait 2 mo and recheck her TFTs and decide at that time about possibly doing RAI tx. I explained how this is done, possible complications, and also radioactive precautions. She and daughter in law agree with this plan. - continue Bisoprolol - check TFTs today - RTC in 4 mo, but possibly sooner for labs  Component     Latest Ref Rng & Units 09/16/2016  TSH  0.35 - 4.50 uIU/mL 0.56  T4,Free(Direct)     0.60 - 1.60 ng/dL 0.55 (L)  Triiodothyronine,Free,Serum     2.3 - 4.2 pg/mL 3.1   Normal labs (except slightly low fT4 >> continue current MMI dose for now and repeat labs in 2 months.  Philemon Kingdom, MD PhD [facility] Endocrinology

## 2016-09-30 ENCOUNTER — Ambulatory Visit (HOSPITAL_BASED_OUTPATIENT_CLINIC_OR_DEPARTMENT_OTHER): Payer: Medicare Other

## 2016-09-30 ENCOUNTER — Ambulatory Visit (HOSPITAL_BASED_OUTPATIENT_CLINIC_OR_DEPARTMENT_OTHER): Payer: Medicare Other | Admitting: Hematology & Oncology

## 2016-09-30 ENCOUNTER — Other Ambulatory Visit (HOSPITAL_BASED_OUTPATIENT_CLINIC_OR_DEPARTMENT_OTHER): Payer: Medicare Other

## 2016-09-30 VITALS — BP 123/63 | HR 83 | Temp 98.2°F | Resp 18 | Wt 128.8 lb

## 2016-09-30 DIAGNOSIS — C3431 Malignant neoplasm of lower lobe, right bronchus or lung: Secondary | ICD-10-CM

## 2016-09-30 DIAGNOSIS — Z5112 Encounter for antineoplastic immunotherapy: Secondary | ICD-10-CM | POA: Diagnosis present

## 2016-09-30 LAB — CBC WITH DIFFERENTIAL (CANCER CENTER ONLY)
BASO#: 0 10*3/uL (ref 0.0–0.2)
BASO%: 0.3 % (ref 0.0–2.0)
EOS%: 1.5 % (ref 0.0–7.0)
Eosinophils Absolute: 0.1 10*3/uL (ref 0.0–0.5)
HEMATOCRIT: 38.9 % (ref 34.8–46.6)
HGB: 12.7 g/dL (ref 11.6–15.9)
LYMPH#: 0.6 10*3/uL — AB (ref 0.9–3.3)
LYMPH%: 9.2 % — AB (ref 14.0–48.0)
MCH: 27.5 pg (ref 26.0–34.0)
MCHC: 32.6 g/dL (ref 32.0–36.0)
MCV: 84 fL (ref 81–101)
MONO#: 0.5 10*3/uL (ref 0.1–0.9)
MONO%: 7.4 % (ref 0.0–13.0)
NEUT#: 5 10*3/uL (ref 1.5–6.5)
NEUT%: 81.6 % — AB (ref 39.6–80.0)
PLATELETS: 243 10*3/uL (ref 145–400)
RBC: 4.62 10*6/uL (ref 3.70–5.32)
RDW: 16.1 % — AB (ref 11.1–15.7)
WBC: 6.1 10*3/uL (ref 3.9–10.0)

## 2016-09-30 LAB — CMP (CANCER CENTER ONLY)
ALT(SGPT): 10 U/L (ref 10–47)
AST: 24 U/L (ref 11–38)
Albumin: 3.5 g/dL (ref 3.3–5.5)
Alkaline Phosphatase: 87 U/L — ABNORMAL HIGH (ref 26–84)
BILIRUBIN TOTAL: 0.7 mg/dL (ref 0.20–1.60)
BUN: 14 mg/dL (ref 7–22)
CALCIUM: 9.3 mg/dL (ref 8.0–10.3)
CO2: 30 meq/L (ref 18–33)
Chloride: 100 mEq/L (ref 98–108)
Creat: 0.8 mg/dl (ref 0.6–1.2)
GLUCOSE: 175 mg/dL — AB (ref 73–118)
Potassium: 3.7 mEq/L (ref 3.3–4.7)
Sodium: 139 mEq/L (ref 128–145)
Total Protein: 7 g/dL (ref 6.4–8.1)

## 2016-09-30 MED ORDER — SODIUM CHLORIDE 0.9 % IV SOLN
1200.0000 mg | Freq: Once | INTRAVENOUS | Status: AC
  Administered 2016-09-30: 1200 mg via INTRAVENOUS
  Filled 2016-09-30: qty 20

## 2016-09-30 MED ORDER — SODIUM CHLORIDE 0.9 % IV SOLN
Freq: Once | INTRAVENOUS | Status: AC
  Administered 2016-09-30: 09:00:00 via INTRAVENOUS

## 2016-09-30 NOTE — Progress Notes (Signed)
Dictation #1 [id number]  [id number]  Hematology and Oncology Follow Up Visit  NATALEAH SCIONEAUX [id number] 03-25-1928 83 y.o. 09/30/2016   Principle Diagnosis:   Stage IV adenocarcinoma of the right lung-mutation negative  Current Therapy:    Tecentriq 1200 mg IV every 3 weeks s/p cycle #3  Status post radiation/chemotherapy for recurrence-completed October 2017  Status post right upper lobectomy in March 2013 for stage IB disease     Interim History:  Ms. Salsberry is back for follow-up. She really looks great. She has recovered and currently well from this fall that she had. There is absolutely no ecchymoses on her face.  She's had no problems with shortness of breath. She has a little bit of a cough. She is on Ramipril . I told her to stop this for a couple weeks. I think that the cough is better, then this is the reason for the cough.   Her appetite is good. She's had no nausea or vomiting. There is no diarrhea.   She's had no headache. She's had some thyroid issues. She sees her endocrinologist. She has hyper thyroidism. She can easily get I-131 therapy. This will not interfere with her immunotherapy.   Overall, her performance status is ECOG 2.  Medications:  Current Outpatient Prescriptions:  [name]  Multiple Vitamins-Minerals (VISION-VITE PRESERVE PO), Take by mouth 2 (two) times daily., Disp: , Rfl:  .  amitriptyline (ELAVIL) 10 MG tablet, Take 1 tablet (10 mg total) by mouth at bedtime as needed. (Patient taking differently: Take 5 mg by mouth at bedtime. ), Disp: 90 tablet, Rfl: 1 .  bisoprolol-hydrochlorothiazide (ZIAC) 2.5-6.25 MG tablet, TAKE 1 TABLET DAILY, Disp: 90 tablet, Rfl: 3 .  Blood Glucose Monitoring Suppl (ONE TOUCH ULTRA 2) w/Device KIT, Use to check blood sugars twice a day Dx E11.9, Disp: 1 each, Rfl: 0 .  calcium carbonate (OS-CAL) 600 MG tablet, Take 600 mg by mouth daily., Disp: , Rfl:  .  gabapentin (NEURONTIN) 100 MG capsule, Take 200 mg by mouth 3  (three) times daily. , Disp: , Rfl:  .  glucose blood (ONE TOUCH ULTRA TEST) test strip, 1 each by Other route 2 (two) times daily. Use to check blood sugars twice a day, Disp: 100 each, Rfl: 3 .  ibuprofen (ADVIL,MOTRIN) 200 MG tablet, Take 200 mg by mouth every 6 (six) hours as needed for fever, headache or mild pain. , Disp: , Rfl:  .  Lancets (ONETOUCH ULTRASOFT) lancets, 1 each by Other route 2 (two) times daily. Use to help check blood sugars twice a day, Disp: 100 each, Rfl: 3 .  lovastatin (MEVACOR) 20 MG tablet, Take 1 tablet (20 mg total) by mouth every other day., Disp: 90 tablet, Rfl: 1 .  metFORMIN (GLUCOPHAGE) 500 MG tablet, Take 1 tablet (500 mg total) by mouth 2 (two) times daily with a meal., Disp: 180 tablet, Rfl: 3 .  methimazole (TAPAZOLE) 5 MG tablet, Take 1 tablet (5 mg total) by mouth daily., Disp: 60 tablet, Rfl: 1 .  ramipril (ALTACE) 2.5 MG capsule, TAKE 1 CAPSULE DAILY, Disp: 90 capsule, Rfl: 1  Allergies:  Allergies  Allergen Reactions  . Codeine Nausea And Vomiting  . Tramadol Other (See Comments)    syncope  . Demerol Nausea Only    Past Medical History, Surgical history, Social history, and Family History were reviewed and updated.  Review of Systems: As above  Physical Exam:  weight is 128 lb 12 oz (58.4 kg). Her oral temperature is  98.2 F (36.8 C). Her blood pressure is 123/63 and her pulse is 83. Her respiration is 18 and oxygen saturation is 98%.   Wt Readings from Last 3 Encounters:  09/30/16 128 lb 12 oz (58.4 kg)  09/16/16 127 lb (57.6 kg)  09/08/16 128 lb (58.1 kg)     Elderly but well-nourished white female in no obvious distress. Head and neck exam shows no ocular or oral lesions. She has no palpable cervical or supraclavicular lymph nodes. Lungs show clear breath sounds bilaterally. She has good air movement bilaterally. Cardiac exam regular rate and rhythm with no murmurs, rubs or bruits. Abdomen is soft. She has decent bowel sounds. There  is no fluid wave. There is no palpable liver or spleen tip. Externally shows no clubbing, cyanosis or edema. She has good range of motion of her joints. She has good strength. Skin exam shows no rashes, ecchymoses or petechia. Neurological exam shows no focal neurological deficits.  Lab Results  Component Value Date   WBC 6.1 09/30/2016   HGB 12.7 09/30/2016   HCT 38.9 09/30/2016   MCV 84 09/30/2016   PLT 243 09/30/2016     Chemistry      Component Value Date/Time   NA 140 09/08/2016 0740   NA 139 06/08/2016 1316   K 3.5 09/08/2016 0740   K 3.7 06/08/2016 1316   CL 104 09/08/2016 0740   CO2 28 09/08/2016 0740   CO2 24 06/08/2016 1316   BUN 20 09/08/2016 0740   BUN 13.3 06/08/2016 1316   CREATININE 0.9 09/08/2016 0740   CREATININE 0.7 06/08/2016 1316      Component Value Date/Time   CALCIUM 9.7 09/08/2016 0740   CALCIUM 9.8 06/08/2016 1316   ALKPHOS 87 (H) 09/08/2016 0740   ALKPHOS 79 06/08/2016 1316   AST 21 09/08/2016 0740   AST 14 06/08/2016 1316   ALT 26 09/08/2016 0740   ALT 11 06/08/2016 1316   BILITOT 0.70 09/08/2016 0740   BILITOT 0.25 06/08/2016 1316         Impression and Plan: Ms. Fenley is a 83 year old white female. She has recurrent non-small cell lung cancer. This appears to be adenocarcinoma. She does not have any mutations that we can target.  We'll proceed with her 4th cycle of treatment. I think she has tolerated this quite well.  I will go ahead and repeat her CT scan. We will see what this shows. Also, we will see a response. At worst, hopefully [place] stable disease.  Everything looks stable or better, then we will continue her on immunotherapy since she is doing so well with this.   We will plan to get her back in 3 weeks. We will get her scans in 2 weeks.    [name], MD 6/6/20188:18 AM

## 2016-09-30 NOTE — Patient Instructions (Signed)
Atezolizumab injection What is this medicine? ATEZOLIZUMAB (a te zoe LIZ ue mab) is a monoclonal antibody. It is used to treat bladder cancer (urothelial cancer) and non-small cell lung cancer. This medicine may be used for other purposes; ask your health care provider or pharmacist if you have questions. COMMON BRAND NAME(S): Tecentriq What should I tell my health care provider before I take this medicine? They need to know if you have any of these conditions: -diabetes -immune system problems -infection -inflammatory bowel disease -liver disease -lung or breathing disease -lupus -nervous system problems like myasthenia gravis or Guillain-Barre syndrome -organ transplant -an unusual or allergic reaction to atezolizumab, other medicines, foods, dyes, or preservatives -pregnant or trying to get pregnant -breast-feeding How should I use this medicine? This medicine is for infusion into a vein. It is given by a health care professional in a hospital or clinic setting. A special MedGuide will be given to you before each treatment. Be sure to read this information carefully each time. Talk to your pediatrician regarding the use of this medicine in children. Special care may be needed. Overdosage: If you think you have taken too much of this medicine contact a poison control center or emergency room at once. NOTE: This medicine is only for you. Do not share this medicine with others. What if I miss a dose? It is important not to miss your dose. Call your doctor or health care professional if you are unable to keep an appointment. What may interact with this medicine? Interactions have not been studied. This list may not describe all possible interactions. Give your health care provider a list of all the medicines, herbs, non-prescription drugs, or dietary supplements you use. Also tell them if you smoke, drink alcohol, or use illegal drugs. Some items may interact with your medicine. What  should I watch for while using this medicine? Your condition will be monitored carefully while you are receiving this medicine. You may need blood work done while you are taking this medicine. Do not become pregnant while taking this medicine or for at least 5 months after stopping it. Women should inform their doctor if they wish to become pregnant or think they might be pregnant. There is a potential for serious side effects to an unborn child. Talk to your health care professional or pharmacist for more information. Do not breast-feed an infant while taking this medicine or for at least 5 months after the last dose. What side effects may I notice from receiving this medicine? Side effects that you should report to your doctor or health care professional as soon as possible: -allergic reactions like skin rash, itching or hives, swelling of the face, lips, or tongue -black, tarry stools -bloody or watery diarrhea -breathing problems -changes in vision -chest pain or chest tightness -chills -facial flushing -fever -headache -signs and symptoms of high blood sugar such as dizziness; dry mouth; dry skin; fruity breath; nausea; stomach pain; increased hunger or thirst; increased urination -signs and symptoms of liver injury like dark yellow or brown urine; general ill feeling or flu-like symptoms; light-colored stools; loss of appetite; nausea; right upper belly pain; unusually weak or tired; yellowing of the eyes or skin -stomach pain -trouble passing urine or change in the amount of urine Side effects that usually do not require medical attention (report to your doctor or health care professional if they continue or are bothersome): -cough -diarrhea -joint pain -muscle pain -muscle weakness -tiredness -weight loss This list may not describe all   possible side effects. Call your doctor for medical advice about side effects. You may report side effects to FDA at 1-800-FDA-1088. Where should  I keep my medicine? This drug is given in a hospital or clinic and will not be stored at home. NOTE: This sheet is a summary. It may not cover all possible information. If you have questions about this medicine, talk to your doctor, pharmacist, or health care provider.  2018 Elsevier/Gold Standard (2015-05-15 17:54:14)  

## 2016-10-06 ENCOUNTER — Ambulatory Visit: Payer: Medicare Other

## 2016-10-06 ENCOUNTER — Ambulatory Visit: Payer: Medicare Other | Admitting: Hematology & Oncology

## 2016-10-06 ENCOUNTER — Other Ambulatory Visit: Payer: Medicare Other

## 2016-10-08 LAB — GUARDANT 360

## 2016-10-14 ENCOUNTER — Encounter (HOSPITAL_BASED_OUTPATIENT_CLINIC_OR_DEPARTMENT_OTHER): Payer: Self-pay

## 2016-10-14 ENCOUNTER — Ambulatory Visit (HOSPITAL_BASED_OUTPATIENT_CLINIC_OR_DEPARTMENT_OTHER)
Admission: RE | Admit: 2016-10-14 | Discharge: 2016-10-14 | Disposition: A | Discharge status: Home / Self Care | Payer: Medicare Other | Source: Ambulatory Visit | Attending: Hematology & Oncology | Admitting: Hematology & Oncology

## 2016-10-14 DIAGNOSIS — S2232XD Fracture of one rib, left side, subsequent encounter for fracture with routine healing: Secondary | ICD-10-CM | POA: Diagnosis not present

## 2016-10-14 DIAGNOSIS — R918 Other nonspecific abnormal finding of lung field: Secondary | ICD-10-CM | POA: Diagnosis not present

## 2016-10-14 DIAGNOSIS — C3431 Malignant neoplasm of lower lobe, right bronchus or lung: Secondary | ICD-10-CM | POA: Insufficient documentation

## 2016-10-14 DIAGNOSIS — X58XXXD Exposure to other specified factors, subsequent encounter: Secondary | ICD-10-CM | POA: Insufficient documentation

## 2016-10-14 DIAGNOSIS — I700 Atherosclerosis of aorta: Secondary | ICD-10-CM | POA: Diagnosis not present

## 2016-10-14 MED ORDER — IOPAMIDOL (ISOVUE-300) INJECTION 61%
100.0000 mL | Freq: Once | INTRAVENOUS | Status: AC | PRN
  Administered 2016-10-14: 80 mL via INTRAVENOUS

## 2016-10-16 ENCOUNTER — Other Ambulatory Visit: Payer: Self-pay | Admitting: Internal Medicine

## 2016-10-18 ENCOUNTER — Other Ambulatory Visit: Payer: Self-pay | Admitting: Internal Medicine

## 2016-10-18 DIAGNOSIS — E119 Type 2 diabetes mellitus without complications: Secondary | ICD-10-CM

## 2016-10-21 ENCOUNTER — Ambulatory Visit (HOSPITAL_BASED_OUTPATIENT_CLINIC_OR_DEPARTMENT_OTHER): Payer: Medicare Other

## 2016-10-21 ENCOUNTER — Other Ambulatory Visit (HOSPITAL_BASED_OUTPATIENT_CLINIC_OR_DEPARTMENT_OTHER): Payer: Medicare Other

## 2016-10-21 ENCOUNTER — Ambulatory Visit (HOSPITAL_BASED_OUTPATIENT_CLINIC_OR_DEPARTMENT_OTHER): Payer: Medicare Other | Admitting: Hematology & Oncology

## 2016-10-21 VITALS — BP 133/66 | HR 77 | Temp 98.4°F | Resp 16 | Wt 130.0 lb

## 2016-10-21 DIAGNOSIS — C3431 Malignant neoplasm of lower lobe, right bronchus or lung: Secondary | ICD-10-CM | POA: Diagnosis not present

## 2016-10-21 DIAGNOSIS — Z5112 Encounter for antineoplastic immunotherapy: Secondary | ICD-10-CM | POA: Diagnosis present

## 2016-10-21 LAB — CBC WITH DIFFERENTIAL (CANCER CENTER ONLY)
BASO#: 0 10*3/uL (ref 0.0–0.2)
BASO%: 0.5 % (ref 0.0–2.0)
EOS ABS: 0.1 10*3/uL (ref 0.0–0.5)
EOS%: 1.2 % (ref 0.0–7.0)
HCT: 39.1 % (ref 34.8–46.6)
HGB: 12.6 g/dL (ref 11.6–15.9)
LYMPH#: 0.5 10*3/uL — ABNORMAL LOW (ref 0.9–3.3)
LYMPH%: 9 % — AB (ref 14.0–48.0)
MCH: 27.2 pg (ref 26.0–34.0)
MCHC: 32.2 g/dL (ref 32.0–36.0)
MCV: 84 fL (ref 81–101)
MONO#: 0.4 10*3/uL (ref 0.1–0.9)
MONO%: 6.9 % (ref 0.0–13.0)
NEUT#: 4.8 10*3/uL (ref 1.5–6.5)
NEUT%: 82.4 % — AB (ref 39.6–80.0)
PLATELETS: 218 10*3/uL (ref 145–400)
RBC: 4.64 10*6/uL (ref 3.70–5.32)
RDW: 16 % — ABNORMAL HIGH (ref 11.1–15.7)
WBC: 5.8 10*3/uL (ref 3.9–10.0)

## 2016-10-21 LAB — CMP (CANCER CENTER ONLY)
ALBUMIN: 3.4 g/dL (ref 3.3–5.5)
ALT(SGPT): 21 U/L (ref 10–47)
AST: 21 U/L (ref 11–38)
Alkaline Phosphatase: 82 U/L (ref 26–84)
BUN: 14 mg/dL (ref 7–22)
CHLORIDE: 101 meq/L (ref 98–108)
CO2: 27 mEq/L (ref 18–33)
CREATININE: 0.8 mg/dL (ref 0.6–1.2)
Calcium: 9.4 mg/dL (ref 8.0–10.3)
Glucose, Bld: 140 mg/dL — ABNORMAL HIGH (ref 73–118)
POTASSIUM: 3.8 meq/L (ref 3.3–4.7)
SODIUM: 140 meq/L (ref 128–145)
Total Bilirubin: 0.8 mg/dl (ref 0.20–1.60)
Total Protein: 7 g/dL (ref 6.4–8.1)

## 2016-10-21 MED ORDER — HYDROCODONE-HOMATROPINE 5-1.5 MG/5ML PO SYRP: 5.0000 mL | ORAL_SOLUTION | Freq: Four times a day (QID) | ORAL | 0 refills | Status: DC | PRN

## 2016-10-21 MED ORDER — SODIUM CHLORIDE 0.9 % IV SOLN
1200.0000 mg | Freq: Once | INTRAVENOUS | Status: AC
  Administered 2016-10-21: 1200 mg via INTRAVENOUS
  Filled 2016-10-21: qty 20

## 2016-10-21 MED ORDER — SODIUM CHLORIDE 0.9 % IV SOLN
Freq: Once | INTRAVENOUS | Status: AC
  Administered 2016-10-21: 10:00:00 via INTRAVENOUS

## 2016-10-21 MED FILL — HYDROCODONE-HOMATROPINE SYR: 5-1.5 | 6 days supply | Qty: 180 | Fill #0

## 2016-10-21 NOTE — Patient Instructions (Signed)
Atezolizumab injection What is this medicine? ATEZOLIZUMAB (a te zoe LIZ ue mab) is a monoclonal antibody. It is used to treat bladder cancer (urothelial cancer) and non-small cell lung cancer. This medicine may be used for other purposes; ask your health care provider or pharmacist if you have questions. COMMON BRAND NAME(S): Tecentriq What should I tell my health care provider before I take this medicine? They need to know if you have any of these conditions: -diabetes -immune system problems -infection -inflammatory bowel disease -liver disease -lung or breathing disease -lupus -nervous system problems like myasthenia gravis or Guillain-Barre syndrome -organ transplant -an unusual or allergic reaction to atezolizumab, other medicines, foods, dyes, or preservatives -pregnant or trying to get pregnant -breast-feeding How should I use this medicine? This medicine is for infusion into a vein. It is given by a health care professional in a hospital or clinic setting. A special MedGuide will be given to you before each treatment. Be sure to read this information carefully each time. Talk to your pediatrician regarding the use of this medicine in children. Special care may be needed. Overdosage: If you think you have taken too much of this medicine contact a poison control center or emergency room at once. NOTE: This medicine is only for you. Do not share this medicine with others. What if I miss a dose? It is important not to miss your dose. Call your doctor or health care professional if you are unable to keep an appointment. What may interact with this medicine? Interactions have not been studied. This list may not describe all possible interactions. Give your health care provider a list of all the medicines, herbs, non-prescription drugs, or dietary supplements you use. Also tell them if you smoke, drink alcohol, or use illegal drugs. Some items may interact with your medicine. What  should I watch for while using this medicine? Your condition will be monitored carefully while you are receiving this medicine. You may need blood work done while you are taking this medicine. Do not become pregnant while taking this medicine or for at least 5 months after stopping it. Women should inform their doctor if they wish to become pregnant or think they might be pregnant. There is a potential for serious side effects to an unborn child. Talk to your health care professional or pharmacist for more information. Do not breast-feed an infant while taking this medicine or for at least 5 months after the last dose. What side effects may I notice from receiving this medicine? Side effects that you should report to your doctor or health care professional as soon as possible: -allergic reactions like skin rash, itching or hives, swelling of the face, lips, or tongue -black, tarry stools -bloody or watery diarrhea -breathing problems -changes in vision -chest pain or chest tightness -chills -facial flushing -fever -headache -signs and symptoms of high blood sugar such as dizziness; dry mouth; dry skin; fruity breath; nausea; stomach pain; increased hunger or thirst; increased urination -signs and symptoms of liver injury like dark yellow or brown urine; general ill feeling or flu-like symptoms; light-colored stools; loss of appetite; nausea; right upper belly pain; unusually weak or tired; yellowing of the eyes or skin -stomach pain -trouble passing urine or change in the amount of urine Side effects that usually do not require medical attention (report to your doctor or health care professional if they continue or are bothersome): -cough -diarrhea -joint pain -muscle pain -muscle weakness -tiredness -weight loss This list may not describe all   possible side effects. Call your doctor for medical advice about side effects. You may report side effects to FDA at 1-800-FDA-1088. Where should  I keep my medicine? This drug is given in a hospital or clinic and will not be stored at home. NOTE: This sheet is a summary. It may not cover all possible information. If you have questions about this medicine, talk to your doctor, pharmacist, or health care provider.  2018 Elsevier/Gold Standard (2015-05-15 17:54:14)  

## 2016-10-21 NOTE — Progress Notes (Signed)
Dictation #1 [id number]  [id number]  Hematology and Oncology Follow Up Visit  Samantha Quinn [id number] 14-Oct-1927 83 y.o. 10/21/2016   Principle Diagnosis:   Stage IV adenocarcinoma of the right lung-mutation negative  Current Therapy:    Tecentriq 1200 mg IV every 3 weeks s/p cycle #4  Status post radiation/chemotherapy for recurrence-completed October 2017  Status post right upper lobectomy in March 2013 for stage IB disease     Interim History:  Samantha Quinn is back for follow-up. She really looks great. She has recovered and currently well from this fall that she had. There is absolutely no ecchymoses on her face.  We did go and repeat a CT scan. This was done on June 20. This showed a response. The right lower lobe lesion now measures 2.2 x 1.4 cm. This is about a 50% decrease in size. There is no evidence of any new metastases.  She has had no problems with cough or shortness of breath. She's had no nausea or vomiting. His been no change in bowel or bladder habits. She's had no headache. She's had no leg swelling.   She does have a lot of seborrheic keratoses. She sees her dermatologist next month.   Overall, her performance status is ECOG 2.  Medications:  Current Outpatient Prescriptions:  .  amitriptyline (ELAVIL) 10 MG tablet, Take 1 tablet (10 mg total) by mouth at bedtime as needed. (Patient taking differently: Take 5 mg by mouth at bedtime. ), Disp: 90 tablet, Rfl: 1 .  bisoprolol-hydrochlorothiazide (ZIAC) 2.5-6.25 MG tablet, TAKE 1 TABLET DAILY, Disp: 90 tablet, Rfl: 3 .  Blood Glucose Monitoring Suppl (ONE TOUCH ULTRA 2) w/Device KIT, Use to check blood sugars twice a day Dx E11.9, Disp: 1 each, Rfl: 0 .  calcium carbonate (OS-CAL) 600 MG tablet, Take 600 mg by mouth daily., Disp: , Rfl:  .  gabapentin (NEURONTIN) 100 MG capsule, Take 200 mg by mouth 3 (three) times daily. , Disp: , Rfl:  .  glucose blood (ONE TOUCH ULTRA TEST) test strip, 1 each by Other  route 2 (two) times daily. Use to check blood sugars twice a day, Disp: 100 each, Rfl: 3 .  ibuprofen (ADVIL,MOTRIN) 200 MG tablet, Take 200 mg by mouth every 6 (six) hours as needed for fever, headache or mild pain. , Disp: , Rfl:  .  Lancets (ONETOUCH ULTRASOFT) lancets, 1 each by Other route 2 (two) times daily. Use to help check blood sugars twice a day, Disp: 100 each, Rfl: 3 .  lovastatin (MEVACOR) 20 MG tablet, TAKE 1 TABLET BY MOUTH EVERY OTHER DAY, Disp: 90 tablet, Rfl: 1 .  metFORMIN (GLUCOPHAGE) 500 MG tablet, TAKE 1 TABLET 2 TIMES DAILY, Disp: 180 tablet, Rfl: 1 .  methimazole (TAPAZOLE) 5 MG tablet, TAKE 1 TABLET (5 MG TOTAL) BY MOUTH DAILY., Disp: 60 tablet, Rfl: 1 .  Multiple Vitamins-Minerals (VISION-VITE PRESERVE PO), Take by mouth 2 (two) times daily., Disp: , Rfl:  .  ramipril (ALTACE) 2.5 MG capsule, TAKE 1 CAPSULE DAILY (Patient not taking: Reported on 10/21/2016), Disp: 90 capsule, Rfl: 1  Allergies:  Allergies  Allergen Reactions  . Codeine Nausea And Vomiting  . Tramadol Other (See Comments)    syncope  . Demerol Nausea Only    Past Medical History, Surgical history, Social history, and Family History were reviewed and updated.  Review of Systems: As above  Physical Exam:  weight is 130 lb (59 kg). Her oral temperature is 98.4 F (36.9 C). Her  blood pressure is 133/66 and her pulse is 77. Her respiration is 16 and oxygen saturation is 100%.   Wt Readings from Last 3 Encounters:  10/21/16 130 lb (59 kg)  09/30/16 128 lb 12 oz (58.4 kg)  09/16/16 127 lb (57.6 kg)     Elderly but well-nourished white female in no obvious distress. Head and neck exam shows no ocular or oral lesions. She has no palpable cervical or supraclavicular lymph nodes. Lungs show clear breath sounds bilaterally. She has good air movement bilaterally. Cardiac exam regular rate and rhythm with no murmurs, rubs or bruits. Abdomen is soft. She has decent bowel sounds. There is no fluid wave.  There is no palpable liver or spleen tip. Externally shows no clubbing, cyanosis or edema. She has good range of motion of her joints. She has good strength. Skin exam shows no rashes, ecchymoses or petechia. Neurological exam shows no focal neurological deficits.  Lab Results  Component Value Date   WBC 5.8 10/21/2016   HGB 12.6 10/21/2016   HCT 39.1 10/21/2016   MCV 84 10/21/2016   PLT 218 10/21/2016     Chemistry      Component Value Date/Time   NA 140 10/21/2016 0846   NA 139 06/08/2016 1316   K 3.8 10/21/2016 0846   K 3.7 06/08/2016 1316   CL 101 10/21/2016 0846   CO2 27 10/21/2016 0846   CO2 24 06/08/2016 1316   BUN 14 10/21/2016 0846   BUN 13.3 06/08/2016 1316   CREATININE 0.8 10/21/2016 0846   CREATININE 0.7 06/08/2016 1316      Component Value Date/Time   CALCIUM 9.4 10/21/2016 0846   CALCIUM 9.8 06/08/2016 1316   ALKPHOS 82 10/21/2016 0846   ALKPHOS 79 06/08/2016 1316   AST 21 10/21/2016 0846   AST 14 06/08/2016 1316   ALT 21 10/21/2016 0846   ALT 11 06/08/2016 1316   BILITOT 0.80 10/21/2016 0846   BILITOT 0.25 06/08/2016 1316         Impression and Plan: Samantha Quinn is a 83 year old white female. She has recurrent non-small cell lung cancer. This appears to be adenocarcinoma. She does not have any mutations that we can target.  We'll proceed with her 5th cycle of treatment. I think she has tolerated this quite well.  We will proceed with her sixth treatment. I probably would give her another 2 or 3 treatments before we repeated her scans. She has done so well so far.  [name], MD 6/27/20189:46 AM

## 2016-10-27 ENCOUNTER — Other Ambulatory Visit: Payer: Medicare Other

## 2016-10-27 ENCOUNTER — Ambulatory Visit: Payer: Medicare Other | Admitting: Hematology & Oncology

## 2016-10-27 ENCOUNTER — Ambulatory Visit: Payer: Medicare Other

## 2016-10-30 ENCOUNTER — Other Ambulatory Visit: Payer: Self-pay | Admitting: Internal Medicine

## 2016-10-30 NOTE — Telephone Encounter (Signed)
Pt would like a refill of amitriptyline (ELAVIL) 10 MG tablet    [facility] on [address] in [place]

## 2016-11-02 MED ORDER — AMITRIPTYLINE HCL 10 MG PO TABS: 10.0000 mg | ORAL_TABLET | Freq: Every day | ORAL | 1 refills | Status: DC

## 2016-11-02 NOTE — Telephone Encounter (Signed)
Pt called left msg on triage checking status for refill. Called pt back no answer LMOM MD sent the amitriptyline to [facility] this am.../lmb

## 2016-11-02 NOTE — Telephone Encounter (Signed)
Please advise on Sig

## 2016-11-03 ENCOUNTER — Telehealth: Payer: Self-pay

## 2016-11-03 NOTE — Telephone Encounter (Signed)
PA Started:

## 2016-11-04 NOTE — Telephone Encounter (Signed)
PA was approved pharmacy contacted

## 2016-11-11 ENCOUNTER — Ambulatory Visit (HOSPITAL_BASED_OUTPATIENT_CLINIC_OR_DEPARTMENT_OTHER): Payer: Medicare Other | Admitting: Hematology & Oncology

## 2016-11-11 ENCOUNTER — Ambulatory Visit (HOSPITAL_BASED_OUTPATIENT_CLINIC_OR_DEPARTMENT_OTHER): Payer: Medicare Other

## 2016-11-11 ENCOUNTER — Other Ambulatory Visit (HOSPITAL_BASED_OUTPATIENT_CLINIC_OR_DEPARTMENT_OTHER): Payer: Medicare Other

## 2016-11-11 VITALS — BP 139/64 | HR 72 | Temp 98.3°F | Resp 16 | Wt 128.0 lb

## 2016-11-11 DIAGNOSIS — C3431 Malignant neoplasm of lower lobe, right bronchus or lung: Secondary | ICD-10-CM | POA: Diagnosis not present

## 2016-11-11 DIAGNOSIS — Z5112 Encounter for antineoplastic immunotherapy: Secondary | ICD-10-CM | POA: Diagnosis present

## 2016-11-11 LAB — CBC WITH DIFFERENTIAL (CANCER CENTER ONLY)
BASO#: 0 10*3/uL (ref 0.0–0.2)
BASO%: 0.6 % (ref 0.0–2.0)
EOS%: 1.2 % (ref 0.0–7.0)
Eosinophils Absolute: 0.1 10*3/uL (ref 0.0–0.5)
HCT: 38.5 % (ref 34.8–46.6)
HGB: 12.6 g/dL (ref 11.6–15.9)
LYMPH#: 0.6 10*3/uL — ABNORMAL LOW (ref 0.9–3.3)
LYMPH%: 11 % — AB (ref 14.0–48.0)
MCH: 27.7 pg (ref 26.0–34.0)
MCHC: 32.7 g/dL (ref 32.0–36.0)
MCV: 85 fL (ref 81–101)
MONO#: 0.5 10*3/uL (ref 0.1–0.9)
MONO%: 8.9 % (ref 0.0–13.0)
NEUT#: 4.1 10*3/uL (ref 1.5–6.5)
NEUT%: 78.3 % (ref 39.6–80.0)
Platelets: 240 10*3/uL (ref 145–400)
RBC: 4.55 10*6/uL (ref 3.70–5.32)
RDW: 15.9 % — ABNORMAL HIGH (ref 11.1–15.7)
WBC: 5.2 10*3/uL (ref 3.9–10.0)

## 2016-11-11 LAB — CMP (CANCER CENTER ONLY)
ALK PHOS: 93 U/L — AB (ref 26–84)
ALT: 17 U/L (ref 10–47)
AST: 23 U/L (ref 11–38)
Albumin: 3.5 g/dL (ref 3.3–5.5)
BILIRUBIN TOTAL: 0.9 mg/dL (ref 0.20–1.60)
BUN, Bld: 14 mg/dL (ref 7–22)
CO2: 30 mEq/L (ref 18–33)
CREATININE: 0.9 mg/dL (ref 0.6–1.2)
Calcium: 9.6 mg/dL (ref 8.0–10.3)
Chloride: 103 mEq/L (ref 98–108)
GLUCOSE: 127 mg/dL — AB (ref 73–118)
Potassium: 3.7 mEq/L (ref 3.3–4.7)
SODIUM: 139 meq/L (ref 128–145)
TOTAL PROTEIN: 7.2 g/dL (ref 6.4–8.1)

## 2016-11-11 LAB — LACTATE DEHYDROGENASE: LDH: 203 U/L (ref 125–245)

## 2016-11-11 MED ORDER — SODIUM CHLORIDE 0.9 % IV SOLN
1200.0000 mg | Freq: Once | INTRAVENOUS | Status: AC
  Administered 2016-11-11: 1200 mg via INTRAVENOUS
  Filled 2016-11-11: qty 20

## 2016-11-11 MED ORDER — SODIUM CHLORIDE 0.9 % IV SOLN
Freq: Once | INTRAVENOUS | Status: AC
  Administered 2016-11-11: 10:00:00 via INTRAVENOUS

## 2016-11-11 NOTE — Patient Instructions (Signed)
Atezolizumab injection What is this medicine? ATEZOLIZUMAB (a te zoe LIZ ue mab) is a monoclonal antibody. It is used to treat bladder cancer (urothelial cancer) and non-small cell lung cancer. This medicine may be used for other purposes; ask your health care provider or pharmacist if you have questions. COMMON BRAND NAME(S): Tecentriq What should I tell my health care provider before I take this medicine? They need to know if you have any of these conditions: -diabetes -immune system problems -infection -inflammatory bowel disease -liver disease -lung or breathing disease -lupus -nervous system problems like myasthenia gravis or Guillain-Barre syndrome -organ transplant -an unusual or allergic reaction to atezolizumab, other medicines, foods, dyes, or preservatives -pregnant or trying to get pregnant -breast-feeding How should I use this medicine? This medicine is for infusion into a vein. It is given by a health care professional in a hospital or clinic setting. A special MedGuide will be given to you before each treatment. Be sure to read this information carefully each time. Talk to your pediatrician regarding the use of this medicine in children. Special care may be needed. Overdosage: If you think you have taken too much of this medicine contact a poison control center or emergency room at once. NOTE: This medicine is only for you. Do not share this medicine with others. What if I miss a dose? It is important not to miss your dose. Call your doctor or health care professional if you are unable to keep an appointment. What may interact with this medicine? Interactions have not been studied. This list may not describe all possible interactions. Give your health care provider a list of all the medicines, herbs, non-prescription drugs, or dietary supplements you use. Also tell them if you smoke, drink alcohol, or use illegal drugs. Some items may interact with your medicine. What  should I watch for while using this medicine? Your condition will be monitored carefully while you are receiving this medicine. You may need blood work done while you are taking this medicine. Do not become pregnant while taking this medicine or for at least 5 months after stopping it. Women should inform their doctor if they wish to become pregnant or think they might be pregnant. There is a potential for serious side effects to an unborn child. Talk to your health care professional or pharmacist for more information. Do not breast-feed an infant while taking this medicine or for at least 5 months after the last dose. What side effects may I notice from receiving this medicine? Side effects that you should report to your doctor or health care professional as soon as possible: -allergic reactions like skin rash, itching or hives, swelling of the face, lips, or tongue -black, tarry stools -bloody or watery diarrhea -breathing problems -changes in vision -chest pain or chest tightness -chills -facial flushing -fever -headache -signs and symptoms of high blood sugar such as dizziness; dry mouth; dry skin; fruity breath; nausea; stomach pain; increased hunger or thirst; increased urination -signs and symptoms of liver injury like dark yellow or brown urine; general ill feeling or flu-like symptoms; light-colored stools; loss of appetite; nausea; right upper belly pain; unusually weak or tired; yellowing of the eyes or skin -stomach pain -trouble passing urine or change in the amount of urine Side effects that usually do not require medical attention (report to your doctor or health care professional if they continue or are bothersome): -cough -diarrhea -joint pain -muscle pain -muscle weakness -tiredness -weight loss This list may not describe all   possible side effects. Call your doctor for medical advice about side effects. You may report side effects to FDA at 1-800-FDA-1088. Where should  I keep my medicine? This drug is given in a hospital or clinic and will not be stored at home. NOTE: This sheet is a summary. It may not cover all possible information. If you have questions about this medicine, talk to your doctor, pharmacist, or health care provider.  2018 Elsevier/Gold Standard (2015-05-15 17:54:14)  

## 2016-11-11 NOTE — Progress Notes (Signed)
Dictation #1 MRN:[id number]  CSN:[id number]  Hematology and Oncology Follow Up Visit  Samantha Quinn [id number] 10/25/1927 83 y.o. 11/11/2016   Principle Diagnosis:   Stage IV adenocarcinoma of the right lung-mutation negative  Current Therapy:    Tecentriq 1200 mg IV every 3 weeks s/p cycle #5  Status post radiation/chemotherapy for recurrence-completed October 2017  Status post right upper lobectomy in March 2013 for stage IB disease     Interim History:  Samantha Quinn is back for follow-up. She really looks great. She is under some stress right now. There have been 3 deaths in the family. She has a funeral that she has to go to this afternoon.  Overall, she has done incredibly well. She's had no cough or shortness of breath. She's had no diarrhea. She's had no rashes.  Thank you, she's not fallen.  She sees her neurosurgeon next week. He has her on gabapentin. This does seem to be helping her.  She's had no bleeding or bruising. Her appetite is good. There's been no nausea or vomiting.  Overall, her performance status is ECOG 2.  Medications:  Current Outpatient Prescriptions:  .  amitriptyline (ELAVIL) 10 MG tablet, Take 1 tablet (10 mg total) by mouth at bedtime., Disp: 90 tablet, Rfl: 1 .  bisoprolol-hydrochlorothiazide (ZIAC) 2.5-6.25 MG tablet, TAKE 1 TABLET DAILY, Disp: 90 tablet, Rfl: 3 .  Blood Glucose Monitoring Suppl (ONE TOUCH ULTRA 2) w/Device KIT, Use to check blood sugars twice a day Dx E11.9, Disp: 1 each, Rfl: 0 .  calcium carbonate (OS-CAL) 600 MG tablet, Take 600 mg by mouth daily., Disp: , Rfl:  .  gabapentin (NEURONTIN) 100 MG capsule, Take 200 mg by mouth 3 (three) times daily. , Disp: , Rfl:  .  glucose blood (ONE TOUCH ULTRA TEST) test strip, 1 each by Other route 2 (two) times daily. Use to check blood sugars twice a day, Disp: 100 each, Rfl: 3 .  HYDROcodone-homatropine (HYCODAN) 5-1.5 MG/5ML syrup, Take 5 mLs by mouth every 6 (six) hours as needed for  cough., Disp: 180 mL, Rfl: 0 .  ibuprofen (ADVIL,MOTRIN) 200 MG tablet, Take 200 mg by mouth every 6 (six) hours as needed for fever, headache or mild pain. , Disp: , Rfl:  .  Lancets (ONETOUCH ULTRASOFT) lancets, 1 each by Other route 2 (two) times daily. Use to help check blood sugars twice a day, Disp: 100 each, Rfl: 3 .  lovastatin (MEVACOR) 20 MG tablet, TAKE 1 TABLET BY MOUTH EVERY OTHER DAY, Disp: 90 tablet, Rfl: 1 .  metFORMIN (GLUCOPHAGE) 500 MG tablet, TAKE 1 TABLET 2 TIMES DAILY, Disp: 180 tablet, Rfl: 1 .  methimazole (TAPAZOLE) 5 MG tablet, TAKE 1 TABLET (5 MG TOTAL) BY MOUTH DAILY., Disp: 60 tablet, Rfl: 1 .  Multiple Vitamins-Minerals (VISION-VITE PRESERVE PO), Take by mouth 2 (two) times daily., Disp: , Rfl:   Allergies:  Allergies  Allergen Reactions  . Codeine Nausea And Vomiting  . Tramadol Other (See Comments)    syncope  . Demerol Nausea Only    Past Medical History, Surgical history, Social history, and Family History were reviewed and updated.  Review of Systems: As above  Physical Exam:  weight is 128 lb (58.1 kg). Her oral temperature is 98.3 F (36.8 C). Her blood pressure is 139/64 and her pulse is 72. Her respiration is 16 and oxygen saturation is 97%.   Wt Readings from Last 3 Encounters:  11/11/16 128 lb (58.1 kg)  10/21/16 130 lb (59   kg)  09/30/16 128 lb 12 oz (58.4 kg)     Elderly but well-nourished white female in no obvious distress. Head and neck exam shows no ocular or oral lesions. She has no palpable cervical or supraclavicular lymph nodes. Lungs show clear breath sounds bilaterally. She has good air movement bilaterally. Cardiac exam regular rate and rhythm with no murmurs, rubs or bruits. Abdomen is soft. She has decent bowel sounds. There is no fluid wave. There is no palpable liver or spleen tip. Externally shows no clubbing, cyanosis or edema. She has good range of motion of her joints. She has good strength. Skin exam shows no rashes,  ecchymoses or petechia. Neurological exam shows no focal neurological deficits.  Lab Results  Component Value Date   WBC 5.2 11/11/2016   HGB 12.6 11/11/2016   HCT 38.5 11/11/2016   MCV 85 11/11/2016   PLT 240 11/11/2016     Chemistry      Component Value Date/Time   NA 139 11/11/2016 0831   NA 139 06/08/2016 1316   K 3.7 11/11/2016 0831   K 3.7 06/08/2016 1316   CL 103 11/11/2016 0831   CO2 30 11/11/2016 0831   CO2 24 06/08/2016 1316   BUN 14 11/11/2016 0831   BUN 13.3 06/08/2016 1316   CREATININE 0.9 11/11/2016 0831   CREATININE 0.7 06/08/2016 1316      Component Value Date/Time   CALCIUM 9.6 11/11/2016 0831   CALCIUM 9.8 06/08/2016 1316   ALKPHOS 93 (H) 11/11/2016 0831   ALKPHOS 79 06/08/2016 1316   AST 23 11/11/2016 0831   AST 14 06/08/2016 1316   ALT 17 11/11/2016 0831   ALT 11 06/08/2016 1316   BILITOT 0.90 11/11/2016 0831   BILITOT 0.25 06/08/2016 1316         Impression and Plan: Samantha Quinn is a 83-year-old white female. She has recurrent non-small cell lung cancer. This appears to be adenocarcinoma. She does not have any mutations that we can target.  We'll proceed with her 6th cycle of treatment. I think she has tolerated this quite well.  I think that either her eighth cycle of treatment, we will repeat her scans. I think that if her scans still look good, then maybe we can spread her treatments out to monthly.  I am praying hard for her as there have been 3 deaths in her family. She has a has a funeral to go to today.    ENNEVER,PETER R, MD 7/18/20189:31 AM 

## 2016-11-12 DIAGNOSIS — D044 Carcinoma in situ of skin of scalp and neck: Secondary | ICD-10-CM | POA: Diagnosis not present

## 2016-11-12 DIAGNOSIS — L820 Inflamed seborrheic keratosis: Secondary | ICD-10-CM | POA: Diagnosis not present

## 2016-11-12 DIAGNOSIS — Z85828 Personal history of other malignant neoplasm of skin: Secondary | ICD-10-CM | POA: Diagnosis not present

## 2016-11-12 DIAGNOSIS — D485 Neoplasm of uncertain behavior of skin: Secondary | ICD-10-CM | POA: Diagnosis not present

## 2016-11-17 DIAGNOSIS — M5126 Other intervertebral disc displacement, lumbar region: Secondary | ICD-10-CM | POA: Diagnosis not present

## 2016-11-17 DIAGNOSIS — M47816 Spondylosis without myelopathy or radiculopathy, lumbar region: Secondary | ICD-10-CM | POA: Diagnosis not present

## 2016-11-17 DIAGNOSIS — M259 Joint disorder, unspecified: Secondary | ICD-10-CM | POA: Diagnosis not present

## 2016-11-18 ENCOUNTER — Other Ambulatory Visit (INDEPENDENT_AMBULATORY_CARE_PROVIDER_SITE_OTHER): Payer: Medicare Other

## 2016-11-18 ENCOUNTER — Ambulatory Visit (INDEPENDENT_AMBULATORY_CARE_PROVIDER_SITE_OTHER): Payer: Medicare Other | Admitting: *Deleted

## 2016-11-18 VITALS — BP 132/76 | HR 76 | Resp 20 | Ht 61.0 in | Wt 129.0 lb

## 2016-11-18 DIAGNOSIS — Z0000 Encounter for general adult medical examination without abnormal findings: Secondary | ICD-10-CM | POA: Diagnosis not present

## 2016-11-18 DIAGNOSIS — E0520 Thyrotoxicosis with toxic multinodular goiter without thyrotoxic crisis or storm: Secondary | ICD-10-CM | POA: Diagnosis not present

## 2016-11-18 LAB — TSH: TSH: 3.65 u[IU]/mL (ref 0.35–4.50)

## 2016-11-18 LAB — T3, FREE: T3 FREE: 2.9 pg/mL (ref 2.3–4.2)

## 2016-11-18 LAB — T4, FREE: Free T4: 0.53 ng/dL — ABNORMAL LOW (ref 0.60–1.60)

## 2016-11-18 MED ORDER — METHIMAZOLE 5 MG PO TABS: 2.5000 mg | ORAL_TABLET | Freq: Every day | ORAL | 3 refills | Status: DC

## 2016-11-18 NOTE — Progress Notes (Addendum)
Subjective:   Samantha Quinn is a 83 y.o. female who presents for an Initial Medicare Annual Wellness Visit.  Review of Systems    No ROS.  Medicare Wellness Visit. Additional risk factors are reflected in the social history.   Cardiac Risk Factors include: advanced age (>45mn, >>5women);diabetes mellitus Sleep patterns: feels rested on waking,  gets up 7-8 times nightly to void. Sleeps about 7-8 hours nightly  Home Safety/Smoke Alarms: Feels safe in home. Smoke alarms in place.  Living environment; residence and Firearm Safety: 1-story house/ trailer, equipment: Walkers, Type: RConservation officer, nature no firearms. Lives alone, no needs for DME, good support system Seat Belt Safety/Bike Helmet: Wears seat belt.   Counseling:   Eye Exam- appointment yearly Dental- appointment every 6 months   Female:   Pap- N/A    Mammo- N/A     Dexa scan- Last 07/10/15, osteoporosis      CCS- N/A     Objective:    Today's Vitals   11/18/16 1303  BP: 132/76  Pulse: 76  Resp: 20  SpO2: 96%  Weight: 129 lb (58.5 kg)  Height: '5\' 1"'  (1.549 m)   Body mass index is 24.37 kg/m.   Current Medications (verified) Outpatient Encounter Prescriptions as of 11/18/2016  Medication Sig  . amitriptyline (ELAVIL) 10 MG tablet Take 1 tablet (10 mg total) by mouth at bedtime.  . bisoprolol-hydrochlorothiazide (ZIAC) 2.5-6.25 MG tablet TAKE 1 TABLET DAILY  . Blood Glucose Monitoring Suppl (ONE TOUCH ULTRA 2) w/Device KIT Use to check blood sugars twice a day Dx E11.9  . calcium carbonate (OS-CAL) 600 MG tablet Take 600 mg by mouth daily.  .Marland Kitchengabapentin (NEURONTIN) 100 MG capsule Take 200 mg by mouth 3 (three) times daily.   .Marland Kitchenglucose blood (ONE TOUCH ULTRA TEST) test strip 1 each by Other route 2 (two) times daily. Use to check blood sugars twice a day  . HYDROcodone-homatropine (HYCODAN) 5-1.5 MG/5ML syrup Take 5 mLs by mouth every 6 (six) hours as needed for cough.  .Marland Kitchenibuprofen (ADVIL,MOTRIN) 200 MG tablet  Take 200 mg by mouth every 6 (six) hours as needed for fever, headache or mild pain.   . Lancets (ONETOUCH ULTRASOFT) lancets 1 each by Other route 2 (two) times daily. Use to help check blood sugars twice a day  . lovastatin (MEVACOR) 20 MG tablet TAKE 1 TABLET BY MOUTH EVERY OTHER DAY  . metFORMIN (GLUCOPHAGE) 500 MG tablet TAKE 1 TABLET 2 TIMES DAILY  . Multiple Vitamins-Minerals (VISION-VITE PRESERVE PO) Take by mouth 2 (two) times daily.  . [DISCONTINUED] methimazole (TAPAZOLE) 5 MG tablet TAKE 1 TABLET (5 MG TOTAL) BY MOUTH DAILY.   No facility-administered encounter medications on file as of 11/18/2016.     Allergies (verified) Codeine; Tramadol; and Demerol   History: Past Medical History:  Diagnosis Date  . Back pain    bulding disc  . Bronchitis    hx of-many,many years   . Diabetes mellitus   . Diabetes mellitus    type 2 and takes Metformin bid  . Dyslipidemia   . Embolism - blood clot    hx of in left lower leg and was on Coumadin for about 676monththis was about 8-1057yrgo  . Family history of adverse reaction to anesthesia    Daughter- Nausea  . History of colonic polyps   . HOH (hard of hearing)   . Hx of migraines    as a teenager  . Hypercholesterolemia   .  Hyperlipidemia    takes Lovastatin every other day  . Hypertension    takes Ziac daily and Ramipril as well  . Hypothyroidism    takes Synthroid every other day  . Insomnia    takes Elavil nightly  . Joint pain   . Lung mass   . Myalgia   . Osteoarthritis   . Osteoporosis   . Pneumothorax of right lung after biopsy 07/09/11   HAD CHEST TUBE PLACEMENT  . PONV (postoperative nausea and vomiting)   . Primary adenocarcinoma of lower lobe of right lung ([place]) 06/24/2011   pT2a, pN0 M0 Stage 1 Well differenced Adenocarcinoma 3.5 cm resected 07/17/2011  . Primary adenocarcinoma of lung ([place]) 06/24/2011  . Shortness of breath dyspnea    With exertion  . Skin cancer    legs have dark spots on them  .  Upper respiratory infection 04/2011  . Urinary incontinence    takes Detrol daily prn   Past Surgical History:  Procedure Laterality Date  . ABDOMINAL HYSTERECTOMY    . APPENDECTOMY     as a child  . CHEST TUBE REMOVAL  07/13/11   RIGHT  CHEST TUBE  . CHOLECYSTECTOMY    . COLONOSCOPY    . COLONOSCOPY    . DILATION AND CURETTAGE OF UTERUS     x 2  . EYE SURGERY Bilateral    Cataract  . HEMORRHOID SURGERY    . LUNG BIOPSY  07/09/11   RIGHT UPER LOBE LUNG MASS   . RIGHT CHEST TUBE PLACEMENT  07/10/11   S/P LUNG BX  . ROTATOR CUFF REPAIR     bilateral  . TOE SURGERY  2013   right small toe  . TONSILLECTOMY     as a child  . VIDEO BRONCHOSCOPY  07/17/2011   Procedure: VIDEO BRONCHOSCOPY;  Surgeon: Grace Isaac, MD;  Location: [facility];  Service: Thoracic;  Laterality: N/A;  . VIDEO BRONCHOSCOPY WITH ENDOBRONCHIAL ULTRASOUND N/A 12/10/2015   Procedure: VIDEO BRONCHOSCOPY WITH ENDOBRONCHIAL ULTRASOUND with biopsies.;  Surgeon: Grace Isaac, MD;  Location: [facility] OR;  Service: Thoracic;  Laterality: N/A;   Family History  Problem Relation Age of Onset  . Hypertension Mother   . Hypertension Father   . Arthritis Sister   . Arthritis Sister   . Diabetes Brother   . Hypertension Sister   . Anesthesia problems Neg Hx   . Hypotension Neg Hx   . Malignant hyperthermia Neg Hx   . Pseudochol deficiency Neg Hx   . Heart attack Neg Hx    Social History   Occupational History  . Not on file.   Social History Main Topics  . Smoking status: Never Smoker  . Smokeless tobacco: Never Used  . Alcohol use No  . Drug use: No  . Sexual activity: Yes    Birth control/ protection: Surgical    Tobacco Counseling Counseling given: Not Answered   Activities of Daily Living In your present state of health, do you have any difficulty performing the following activities: 11/18/2016  Hearing? Y  Vision? N  Difficulty concentrating or making decisions? N  Walking or climbing stairs? N    Dressing or bathing? N  Doing errands, shopping? N  Preparing Food and eating ? N  Using the Toilet? N  In the past six months, have you accidently leaked urine? N  Do you have problems with loss of bowel control? N  Managing your Medications? N  Managing your Finances? N  Housekeeping  or managing your Housekeeping? N  Some recent data might be hidden    Immunizations and Health Maintenance Immunization History  Administered Date(s) Administered  . Influenza Nasal 12/10/2015  . Influenza-Unspecified 12/26/2012, 01/24/2014, 12/27/2014  . Pneumococcal Conjugate-13 02/26/2015  . Pneumococcal Polysaccharide-23 07/27/2011   Health Maintenance Due  Topic Date Due  . FOOT EXAM  01/15/1940  . URINE MICROALBUMIN  01/15/1940  . TETANUS/TDAP  01/14/1949  . HEMOGLOBIN A1C  09/23/2016    Patient Care Team: Binnie Rail, MD as PCP - General (Internal Medicine) [name], MD (Cardiology) Grace Isaac, MD (Cardiothoracic Surgery) Glenda Chroman, MD as Referring Physician (Internal Medicine)  Indicate any recent Medical Services you may have received from other than [facility] providers in the past year (date may be approximate).     Assessment:   This is a routine wellness examination for Erinn. Physical assessment deferred to PCP.   Hearing/Vision screen Hearing Screening Comments: Hearing aides Vision Screening Comments: Wears glasses  Dietary issues and exercise activities discussed: Current Exercise Habits: The patient does not participate in regular exercise at present, Exercise limited by: None identified  Sleep patterns: feels rested on waking, gets up 1-2 times nightly to void and sleeps 7-8 hours nightly.    Home Safety/Smoke Alarms: Feels safe in home. Smoke alarms in place.  Living environment; residence and Firearm Safety: 1-story house/ trailer, no firearms. Lives alone, no needs for DME, good support system  Seat Belt Safety/Bike Helmet: Wears seat belt.    Counseling:   Eye Exam- appointment yearly Dental- appointment every 6 months   Female:   Pap- N/A      Mammo- N/A      Dexa scan- Last 07/10/15, osteoporosis     CCS- N/A   Goals    . Increase my activity level          I will work with physical therapy to get my back stronger and walk as much as I can.       Depression Screen PHQ 2/9 Scores 11/18/2016 03/24/2016 03/09/2016 05/07/2015  PHQ - 2 Score 2 0 0 0  PHQ- 9 Score 3 - - -    Fall Risk Fall Risk  11/18/2016 09/30/2016 06/25/2016 04/03/2016 03/24/2016  Falls in the past year? No Yes No Yes No  Number falls in past yr: - 1 - 1 -  Injury with Fall? - Yes - Yes -  Risk Factor Category  - High Fall Risk - - -  Risk for fall due to : - - - - -  Follow up - Falls prevention discussed - - -    Cognitive Function: MMSE - Mini Mental State Exam 11/18/2016  Orientation to time 5  Orientation to Place 5  Registration 3  Attention/ Calculation 5  Recall 2  Language- name 2 objects 2  Language- repeat 1  Language- follow 3 step command 3  Language- read & follow direction 1  Write a sentence 1  Copy design 1  Total score 29        Screening Tests Health Maintenance  Topic Date Due  . FOOT EXAM  01/15/1940  . URINE MICROALBUMIN  01/15/1940  . TETANUS/TDAP  01/14/1949  . HEMOGLOBIN A1C  09/23/2016  . INFLUENZA VACCINE  11/25/2016  . OPHTHALMOLOGY EXAM  02/25/2017  . DEXA SCAN  Completed  . PNA vac Low Risk Adult  Completed      Plan:     Continue doing brain stimulating activities (puzzles,  reading, adult coloring books, staying active) to keep memory sharp.   Continue to eat heart healthy diet (full of fruits, vegetables, whole grains, lean protein, water--limit salt, fat, and sugar intake) and increase physical activity as tolerated.   I have personally reviewed and noted the following in the patient's chart:   . Medical and social history . Use of alcohol, tobacco or illicit drugs  . Current medications  and supplements . Functional ability and status . Nutritional status . Physical activity . Advanced directives . List of other physicians . Vitals . Screenings to include cognitive, depression, and falls . Referrals and appointments  In addition, I have reviewed and discussed with patient certain preventive protocols, quality metrics, and best practice recommendations. A written personalized care plan for preventive services as well as general preventive health recommendations were provided to patient.     Michiel Cowboy, RN   11/18/2016    Medical screening examination/treatment/procedure(s) were performed by non-physician practitioner and as supervising physician I was immediately available for consultation/collaboration. I agree with above. Cathlean Cower, MD

## 2016-11-18 NOTE — Progress Notes (Signed)
Pre visit review using our clinic review tool, if applicable. No additional management support is needed unless otherwise documented below in the visit note. 

## 2016-11-18 NOTE — Patient Instructions (Addendum)
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 Closes 5PM Phone: [phone]  Continue doing brain stimulating activities (puzzles, reading, adult coloring books, staying active) to keep memory sharp.   Continue to eat heart healthy diet (full of fruits, vegetables, whole grains, lean protein, water--limit salt, fat, and sugar intake) and increase physical activity as tolerated.   Ms. Brashear , Thank you for taking time to come for your Medicare Wellness Visit. I appreciate your ongoing commitment to your health goals. Please review the following plan we discussed and let me know if I can assist you in the future.   These are the goals we discussed: Goals    . Increase my activity level          I will work with physical therapy to get my back stronger and walk as much as I can.        This is a list of the screening recommended for you and due dates:  Health Maintenance  Topic Date Due  . Complete foot exam   01/15/1940  . Urine Protein Check  01/15/1940  . Tetanus Vaccine  01/14/1949  . Pneumonia vaccines (1 of 2 - PCV13) 01/15/1995  . Hemoglobin A1C  09/23/2016  . Flu Shot  11/25/2016  . Eye exam for diabetics  02/25/2017  . DEXA scan (bone density measurement)  Completed

## 2016-11-19 DIAGNOSIS — E119 Type 2 diabetes mellitus without complications: Secondary | ICD-10-CM | POA: Diagnosis not present

## 2016-11-19 DIAGNOSIS — H353132 Nonexudative age-related macular degeneration, bilateral, intermediate dry stage: Secondary | ICD-10-CM | POA: Diagnosis not present

## 2016-11-19 DIAGNOSIS — Z961 Presence of intraocular lens: Secondary | ICD-10-CM | POA: Diagnosis not present

## 2016-11-19 DIAGNOSIS — H1851 Endothelial corneal dystrophy: Secondary | ICD-10-CM | POA: Diagnosis not present

## 2016-12-02 ENCOUNTER — Ambulatory Visit (HOSPITAL_BASED_OUTPATIENT_CLINIC_OR_DEPARTMENT_OTHER): Payer: Medicare Other

## 2016-12-02 ENCOUNTER — Other Ambulatory Visit (HOSPITAL_BASED_OUTPATIENT_CLINIC_OR_DEPARTMENT_OTHER): Payer: Medicare Other

## 2016-12-02 ENCOUNTER — Ambulatory Visit (HOSPITAL_BASED_OUTPATIENT_CLINIC_OR_DEPARTMENT_OTHER): Payer: Medicare Other | Admitting: Hematology & Oncology

## 2016-12-02 VITALS — BP 136/55 | HR 72 | Temp 98.1°F | Resp 16 | Wt 129.0 lb

## 2016-12-02 DIAGNOSIS — C3431 Malignant neoplasm of lower lobe, right bronchus or lung: Secondary | ICD-10-CM

## 2016-12-02 DIAGNOSIS — Z5112 Encounter for antineoplastic immunotherapy: Secondary | ICD-10-CM

## 2016-12-02 LAB — CMP (CANCER CENTER ONLY)
ALT(SGPT): 21 U/L (ref 10–47)
AST: 21 U/L (ref 11–38)
Albumin: 3.4 g/dL (ref 3.3–5.5)
Alkaline Phosphatase: 84 U/L (ref 26–84)
BUN: 14 mg/dL (ref 7–22)
CHLORIDE: 103 meq/L (ref 98–108)
CO2: 27 meq/L (ref 18–33)
CREATININE: 0.9 mg/dL (ref 0.6–1.2)
Calcium: 9.5 mg/dL (ref 8.0–10.3)
GLUCOSE: 150 mg/dL — AB (ref 73–118)
Potassium: 4.2 mEq/L (ref 3.3–4.7)
Sodium: 139 mEq/L (ref 128–145)
Total Bilirubin: 0.8 mg/dl (ref 0.20–1.60)
Total Protein: 7.1 g/dL (ref 6.4–8.1)

## 2016-12-02 LAB — CBC WITH DIFFERENTIAL (CANCER CENTER ONLY)
BASO#: 0 10*3/uL (ref 0.0–0.2)
BASO%: 0.4 % (ref 0.0–2.0)
EOS ABS: 0.1 10*3/uL (ref 0.0–0.5)
EOS%: 1 % (ref 0.0–7.0)
HCT: 37.9 % (ref 34.8–46.6)
HGB: 12.5 g/dL (ref 11.6–15.9)
LYMPH#: 0.5 10*3/uL — ABNORMAL LOW (ref 0.9–3.3)
LYMPH%: 10.2 % — AB (ref 14.0–48.0)
MCH: 28.2 pg (ref 26.0–34.0)
MCHC: 33 g/dL (ref 32.0–36.0)
MCV: 85 fL (ref 81–101)
MONO#: 0.4 10*3/uL (ref 0.1–0.9)
MONO%: 7.2 % (ref 0.0–13.0)
NEUT#: 4.1 10*3/uL (ref 1.5–6.5)
NEUT%: 81.2 % — ABNORMAL HIGH (ref 39.6–80.0)
PLATELETS: 199 10*3/uL (ref 145–400)
RBC: 4.44 10*6/uL (ref 3.70–5.32)
RDW: 15.3 % (ref 11.1–15.7)
WBC: 5 10*3/uL (ref 3.9–10.0)

## 2016-12-02 LAB — LACTATE DEHYDROGENASE: LDH: 191 U/L (ref 125–245)

## 2016-12-02 MED ORDER — SODIUM CHLORIDE 0.9 % IV SOLN
1200.0000 mg | Freq: Once | INTRAVENOUS | Status: AC
  Administered 2016-12-02: 1200 mg via INTRAVENOUS
  Filled 2016-12-02: qty 20

## 2016-12-02 MED ORDER — SODIUM CHLORIDE 0.9 % IV SOLN
Freq: Once | INTRAVENOUS | Status: AC
  Administered 2016-12-02: 10:00:00 via INTRAVENOUS

## 2016-12-02 NOTE — Patient Instructions (Signed)
Atezolizumab injection What is this medicine? ATEZOLIZUMAB (a te zoe LIZ ue mab) is a monoclonal antibody. It is used to treat bladder cancer (urothelial cancer) and non-small cell lung cancer. This medicine may be used for other purposes; ask your health care provider or pharmacist if you have questions. COMMON BRAND NAME(S): Tecentriq What should I tell my health care provider before I take this medicine? They need to know if you have any of these conditions: -diabetes -immune system problems -infection -inflammatory bowel disease -liver disease -lung or breathing disease -lupus -nervous system problems like myasthenia gravis or Guillain-Barre syndrome -organ transplant -an unusual or allergic reaction to atezolizumab, other medicines, foods, dyes, or preservatives -pregnant or trying to get pregnant -breast-feeding How should I use this medicine? This medicine is for infusion into a vein. It is given by a health care professional in a hospital or clinic setting. A special MedGuide will be given to you before each treatment. Be sure to read this information carefully each time. Talk to your pediatrician regarding the use of this medicine in children. Special care may be needed. Overdosage: If you think you have taken too much of this medicine contact a poison control center or emergency room at once. NOTE: This medicine is only for you. Do not share this medicine with others. What if I miss a dose? It is important not to miss your dose. Call your doctor or health care professional if you are unable to keep an appointment. What may interact with this medicine? Interactions have not been studied. This list may not describe all possible interactions. Give your health care provider a list of all the medicines, herbs, non-prescription drugs, or dietary supplements you use. Also tell them if you smoke, drink alcohol, or use illegal drugs. Some items may interact with your medicine. What  should I watch for while using this medicine? Your condition will be monitored carefully while you are receiving this medicine. You may need blood work done while you are taking this medicine. Do not become pregnant while taking this medicine or for at least 5 months after stopping it. Women should inform their doctor if they wish to become pregnant or think they might be pregnant. There is a potential for serious side effects to an unborn child. Talk to your health care professional or pharmacist for more information. Do not breast-feed an infant while taking this medicine or for at least 5 months after the last dose. What side effects may I notice from receiving this medicine? Side effects that you should report to your doctor or health care professional as soon as possible: -allergic reactions like skin rash, itching or hives, swelling of the face, lips, or tongue -black, tarry stools -bloody or watery diarrhea -breathing problems -changes in vision -chest pain or chest tightness -chills -facial flushing -fever -headache -signs and symptoms of high blood sugar such as dizziness; dry mouth; dry skin; fruity breath; nausea; stomach pain; increased hunger or thirst; increased urination -signs and symptoms of liver injury like dark yellow or brown urine; general ill feeling or flu-like symptoms; light-colored stools; loss of appetite; nausea; right upper belly pain; unusually weak or tired; yellowing of the eyes or skin -stomach pain -trouble passing urine or change in the amount of urine Side effects that usually do not require medical attention (report to your doctor or health care professional if they continue or are bothersome): -cough -diarrhea -joint pain -muscle pain -muscle weakness -tiredness -weight loss This list may not describe all   possible side effects. Call your doctor for medical advice about side effects. You may report side effects to FDA at 1-800-FDA-1088. Where should  I keep my medicine? This drug is given in a hospital or clinic and will not be stored at home. NOTE: This sheet is a summary. It may not cover all possible information. If you have questions about this medicine, talk to your doctor, pharmacist, or health care provider.  2018 Elsevier/Gold Standard (2015-05-15 17:54:14)  

## 2016-12-02 NOTE — Progress Notes (Signed)
Dictation #1 [id number]  [id number]  Hematology and Oncology Follow Up Visit  Samantha Quinn [id number] Dec 30, 1927 83 y.o. 12/02/2016   Principle Diagnosis:   Stage IV adenocarcinoma of the right lung-mutation negative  Current Therapy:    Tecentriq 1200 mg IV every 3 weeks s/p cycle #6  Status post radiation/chemotherapy for recurrence-completed October 2017  Status post right upper lobectomy in March 2013 for stage IB disease     Interim History:  Samantha Quinn is back for follow-up. She really looks great. Thankfully, she got through all of the funerals that she had to go to the past couple weeks. She survived all the rain that we had.   She did see her back doctor. He is going to put her on some physical therapy to try to help strengthen her spine.   Overall, she has done incredibly well. She's had no cough or shortness of breath. She's had no diarrhea. She's had no rashes.  She's had no bleeding or bruising. Her appetite is good. There's been no nausea or vomiting.  Overall, her performance status is ECOG 2.  Medications:  Current Outpatient Prescriptions:  .  amitriptyline (ELAVIL) 10 MG tablet, Take 1 tablet (10 mg total) by mouth at bedtime., Disp: 90 tablet, Rfl: 1 .  bisoprolol-hydrochlorothiazide (ZIAC) 2.5-6.25 MG tablet, TAKE 1 TABLET DAILY, Disp: 90 tablet, Rfl: 3 .  Blood Glucose Monitoring Suppl (ONE TOUCH ULTRA 2) w/Device KIT, Use to check blood sugars twice a day Dx E11.9, Disp: 1 each, Rfl: 0 .  calcium carbonate (OS-CAL) 600 MG tablet, Take 600 mg by mouth daily., Disp: , Rfl:  .  gabapentin (NEURONTIN) 100 MG capsule, Take 200 mg by mouth 3 (three) times daily. , Disp: , Rfl:  .  glucose blood (ONE TOUCH ULTRA TEST) test strip, 1 each by Other route 2 (two) times daily. Use to check blood sugars twice a day, Disp: 100 each, Rfl: 3 .  HYDROcodone-homatropine (HYCODAN) 5-1.5 MG/5ML syrup, Take 5 mLs by mouth every 6 (six) hours as needed for cough., Disp:  180 mL, Rfl: 0 .  ibuprofen (ADVIL,MOTRIN) 200 MG tablet, Take 200 mg by mouth every 6 (six) hours as needed for fever, headache or mild pain. , Disp: , Rfl:  .  Lancets (ONETOUCH ULTRASOFT) lancets, 1 each by Other route 2 (two) times daily. Use to help check blood sugars twice a day, Disp: 100 each, Rfl: 3 .  lovastatin (MEVACOR) 20 MG tablet, TAKE 1 TABLET BY MOUTH EVERY OTHER DAY, Disp: 90 tablet, Rfl: 1 .  metFORMIN (GLUCOPHAGE) 500 MG tablet, TAKE 1 TABLET 2 TIMES DAILY, Disp: 180 tablet, Rfl: 1 .  methimazole (TAPAZOLE) 5 MG tablet, Take 0.5 tablets (2.5 mg total) by mouth daily. (Patient taking differently: Take 2.5 mg by mouth. Take one every other day.), Disp: 30 tablet, Rfl: 3 .  Multiple Vitamins-Minerals (VISION-VITE PRESERVE PO), Take by mouth 2 (two) times daily., Disp: , Rfl:   Allergies:  Allergies  Allergen Reactions  . Codeine Nausea And Vomiting  . Tramadol Other (See Comments)    syncope  . Demerol Nausea Only    Past Medical History, Surgical history, Social history, and Family History were reviewed and updated.  Review of Systems: As above  Physical Exam:  weight is 129 lb (58.5 kg). Her oral temperature is 98.1 F (36.7 C). Her blood pressure is 136/55 (abnormal) and her pulse is 72. Her respiration is 16 and oxygen saturation is 100%.   Wt Readings from  Last 3 Encounters:  12/02/16 129 lb (58.5 kg)  11/18/16 129 lb (58.5 kg)  11/11/16 128 lb (58.1 kg)     Elderly but well-nourished white female in no obvious distress. Head and neck exam shows no ocular or oral lesions. She has no palpable cervical or supraclavicular lymph nodes. Lungs show clear breath sounds bilaterally. She has good air movement bilaterally. Cardiac exam regular rate and rhythm with no murmurs, rubs or bruits. Abdomen is soft. She has decent bowel sounds. There is no fluid wave. There is no palpable liver or spleen tip. Externally shows no clubbing, cyanosis or edema. She has good range of  motion of her joints. She has good strength. Skin exam shows no rashes, ecchymoses or petechia. Neurological exam shows no focal neurological deficits.  Lab Results  Component Value Date   WBC 5.0 12/02/2016   HGB 12.5 12/02/2016   HCT 37.9 12/02/2016   MCV 85 12/02/2016   PLT 199 12/02/2016     Chemistry      Component Value Date/Time   NA 139 11/11/2016 0831   NA 139 06/08/2016 1316   K 3.7 11/11/2016 0831   K 3.7 06/08/2016 1316   CL 103 11/11/2016 0831   CO2 30 11/11/2016 0831   CO2 24 06/08/2016 1316   BUN 14 11/11/2016 0831   BUN 13.3 06/08/2016 1316   CREATININE 0.9 11/11/2016 0831   CREATININE 0.7 06/08/2016 1316      Component Value Date/Time   CALCIUM 9.6 11/11/2016 0831   CALCIUM 9.8 06/08/2016 1316   ALKPHOS 93 (H) 11/11/2016 0831   ALKPHOS 79 06/08/2016 1316   AST 23 11/11/2016 0831   AST 14 06/08/2016 1316   ALT 17 11/11/2016 0831   ALT 11 06/08/2016 1316   BILITOT 0.90 11/11/2016 0831   BILITOT 0.25 06/08/2016 1316         Impression and Plan: Samantha Quinn is a 83 year old white female. She has recurrent non-small cell lung cancer. This appears to be adenocarcinoma. She does not have any mutations that we can target.  We'll proceed with her 7th cycle of treatment. I think she has tolerated this quite well.  I think that after her 8th cycle of treatment, we will repeat her scans. I think that if her scans still look good, then maybe we can spread her treatments out to monthly.  We'll plan to get her back in 3 more weeks.    [name], MD 8/8/20189:14 AM

## 2016-12-03 ENCOUNTER — Ambulatory Visit: Payer: Medicare Other | Admitting: Physical Therapy

## 2016-12-07 ENCOUNTER — Ambulatory Visit: Payer: Medicare Other | Attending: Neurosurgery | Admitting: Physical Therapy

## 2016-12-07 DIAGNOSIS — R293 Abnormal posture: Secondary | ICD-10-CM | POA: Insufficient documentation

## 2016-12-07 DIAGNOSIS — R262 Difficulty in walking, not elsewhere classified: Secondary | ICD-10-CM | POA: Insufficient documentation

## 2016-12-07 DIAGNOSIS — M6281 Muscle weakness (generalized): Secondary | ICD-10-CM | POA: Diagnosis not present

## 2016-12-07 DIAGNOSIS — M545 Low back pain: Secondary | ICD-10-CM | POA: Insufficient documentation

## 2016-12-07 DIAGNOSIS — G8929 Other chronic pain: Secondary | ICD-10-CM | POA: Diagnosis not present

## 2016-12-07 NOTE — Patient Instructions (Addendum)
Hamstring Step 2   Left foot relaxed, knee straight, other leg bent, foot flat. Raise straight leg further upward to maximal range. Hold _30__ seconds. Relax leg completely down. Repeat _3__ times.  Lumbar Rotation (Non-Weight Bearing)   Feet on floor, slowly rock knees from side to side in small, pain-free range of motion. Allow lower back to rotate slightly. Repeat _10-15___ times per set. Do __2__ sessions per day.  Piriformis Stretch, Sitting   Sit, one ankle on opposite knee, same-side hand on crossed knee. Push down on knee, keeping spine straight. Lean torso forward, with flat back, until tension is felt in hamstrings and gluteals of crossed-leg side. Hold _30__ seconds.  Repeat _3__ times per session.

## 2016-12-07 NOTE — Therapy (Signed)
[facility] [address]  [place], [place], [address] Phone: [phone]   Fax:  [phone]  Physical Therapy Evaluation  Patient Details  Name: Samantha Quinn MRN: [id number] Date of Birth: 1927-07-15 Referring Provider: Dr. Melton Krebs  Encounter Date: 12/07/2016      PT End of Session - 12/07/16 1523    Visit Number 1   Number of Visits 12   Date for PT Re-Evaluation 01/18/17   Authorization Type Medicare   PT Start Time 6962   PT Stop Time 1534   PT Time Calculation (min) 49 min   Activity Tolerance Patient tolerated treatment well   Behavior During Therapy [facility] for tasks assessed/performed      Past Medical History:  Diagnosis Date  . Back pain    bulding disc  . Bronchitis    hx of-many,many years   . Diabetes mellitus   . Diabetes mellitus    type 2 and takes Metformin bid  . Dyslipidemia   . Embolism - blood clot    hx of in left lower leg and was on Coumadin for about 66months;this was about 8-27yrs ago  . Family history of adverse reaction to anesthesia    Daughter- Nausea  . History of colonic polyps   . HOH (hard of hearing)   . Hx of migraines    as a teenager  . Hypercholesterolemia   . Hyperlipidemia    takes Lovastatin every other day  . Hypertension    takes Ziac daily and Ramipril as well  . Hypothyroidism    takes Synthroid every other day  . Insomnia    takes Elavil nightly  . Joint pain   . Lung mass   . Myalgia   . Osteoarthritis   . Osteoporosis   . Pneumothorax of right lung after biopsy 07/09/11   HAD CHEST TUBE PLACEMENT  . PONV (postoperative nausea and vomiting)   . Primary adenocarcinoma of lower lobe of right lung ([place]) 06/24/2011   pT2a, pN0 M0 Stage 1 Well differenced Adenocarcinoma 3.5 cm resected 07/17/2011  . Primary adenocarcinoma of lung ([place]) 06/24/2011  . Shortness of breath dyspnea    With exertion  . Skin cancer    legs have dark spots on them   . Upper respiratory infection 04/2011  . Urinary incontinence    takes Detrol daily prn    Past Surgical History:  Procedure Laterality Date  . ABDOMINAL HYSTERECTOMY    . APPENDECTOMY     as a child  . CHEST TUBE REMOVAL  07/13/11   RIGHT  CHEST TUBE  . CHOLECYSTECTOMY    . COLONOSCOPY    . COLONOSCOPY    . DILATION AND CURETTAGE OF UTERUS     x 2  . EYE SURGERY Bilateral    Cataract  . HEMORRHOID SURGERY    . LUNG BIOPSY  07/09/11   RIGHT UPER LOBE LUNG MASS   . RIGHT CHEST TUBE PLACEMENT  07/10/11   S/P LUNG BX  . ROTATOR CUFF REPAIR     bilateral  . TOE SURGERY  2013   right small toe  . TONSILLECTOMY     as a child  . VIDEO BRONCHOSCOPY  07/17/2011   Procedure: VIDEO BRONCHOSCOPY;  Surgeon: Grace Isaac, MD;  Location: [facility];  Service: Thoracic;  Laterality: N/A;  . VIDEO BRONCHOSCOPY WITH ENDOBRONCHIAL ULTRASOUND N/A 12/10/2015   Procedure: VIDEO BRONCHOSCOPY WITH ENDOBRONCHIAL ULTRASOUND with biopsies.;  Surgeon:  Grace Isaac, MD;  Location: [facility];  Service: Thoracic;  Laterality: N/A;    There were no vitals filed for this visit.       Subjective Assessment - 12/07/16 1446    Subjective patient reporting L sided lower back pain. Patient reporting "ruptured disc". Patient has N&T into anterior thigh to knee level of L LE. Does take gabapentin - unsure of true benefit, as well as general fatigue with taking this. Pain is worse with walking and standing, as well as with household chores. Sleep is not bothered - able to roll and lie supine with no issue. No bowel/bladder involvement.    Pertinent History lung cancer, DM 2, HTN, osteoporosis   Limitations Standing;Walking   How long can you sit comfortably? no issue   How long can you stand comfortably? 15 minutes   How long can you walk comfortably? "not very long"   Diagnostic tests Xray and MRI: "ruptured disc" per patient   Patient Stated Goals improve pain and function   Currently in Pain? No/denies    Pain Score --  0/10 sitting; 8/10 with walking   Pain Location Back   Pain Orientation Left;Lower   Pain Descriptors / Indicators Aching;Discomfort   Pain Type Chronic pain   Pain Onset More than a month ago   Pain Frequency Intermittent   Aggravating Factors  standing, walking   Pain Relieving Factors rest            [facility] PT Assessment - 12/07/16 1458      Assessment   Medical Diagnosis Lumbar disc herniation   Referring Provider Dr. Melton Krebs   Next MD Visit --  ~4 months   Prior Therapy no     Precautions   Precautions None     Restrictions   Weight Bearing Restrictions No     Balance Screen   Has the patient fallen in the past 6 months Yes   How many times? 1   Has the patient had a decrease in activity level because of a fear of falling?  No   Is the patient reluctant to leave their home because of a fear of falling?  No     Home Environment   Living Environment Private residence   [place] Access Level entry   [place] One level   Additional Comments husband had rollator - thinking about using it     Prior Function   Level of Independence Independent   Vocation Retired     Associate Professor   Overall Cognitive Status Within Functional Limits for tasks assessed     Observation/Other Assessments   Focus on Therapeutic Outcomes (FOTO)  Lumbar: 46 (54% limited, predicted 45% limited)     Sensation   Light Touch Appears Intact     Coordination   Gross Motor Movements are Fluid and Coordinated Yes     Posture/Postural Control   Posture/Postural Control Postural limitations   Postural Limitations Rounded Shoulders;Forward head;Increased thoracic kyphosis     ROM / Strength   AROM / PROM / Strength Strength;AROM     AROM   AROM Assessment Site Lumbar   Lumbar Flexion fingertip to anterior ankle joint - minimal pain   Lumbar Extension 50% limited - pain ful   Lumbar - Right Side Bend WFL - no pain   Lumbar - Left Side  Bend 50% limited - painful   Lumbar - Right Rotation WFL   Lumbar - Left Rotation  [facility]     Strength   Strength Assessment Site Hip;Knee   Right/Left Hip Right;Left   Right Hip Flexion 4+/5   Right Hip Extension 4-/5   Right Hip ABduction 4-/5   Left Hip Flexion 4-/5   Left Hip Extension 3+/5   Left Hip ABduction 3+/5   Right/Left Knee Right;Left   Right Knee Flexion 4+/5   Right Knee Extension 4+/5   Left Knee Flexion 4+/5   Left Knee Extension 4+/5     Flexibility   Soft Tissue Assessment /Muscle Length yes   Hamstrings B moderate tightness     Palpation   Palpation comment TTP over L PSIS, glute/piriformis complex     Special Tests    Special Tests Lumbar   Lumbar Tests Straight Leg Raise     Straight Leg Raise   Findings Negative   Side  Left            Objective measurements completed on examination: See above findings.          [facility] Adult PT Treatment/Exercise - 12/07/16 1458      Exercises   Exercises Knee/Hip;Lumbar     Lumbar Exercises: Stretches   Lower Trunk Rotation 5 reps;10 seconds   Piriformis Stretch 2 reps;20 seconds   Piriformis Stretch Limitations L - seated figure 4     Knee/Hip Exercises: Stretches   Passive Hamstring Stretch Right;Left;3 reps;30 seconds                PT Education - 12/07/16 1522    Education provided Yes   Education Details exam findings, POC, HEP   Person(s) Educated Patient   Methods Explanation;Demonstration;Handout   Comprehension Verbalized understanding;Returned demonstration;Need further instruction          PT Short Term Goals - 12/07/16 1650      PT SHORT TERM GOAL #1   Title patient to be independent with initial HEP for lumbar AROM and stretching    Status New   Target Date 12/28/16           PT Long Term Goals - 12/07/16 1651      PT LONG TERM GOAL #1   Title patient to be independent with advanced HEP    Status New   Target Date 01/18/17     PT LONG TERM GOAL #2    Title patient to improve lumbar AROM to [facility] in all planes without pain limiting   Status New   Target Date 01/18/17     PT LONG TERM GOAL #3   Title patient to report ability to ambulate for >/= 30 min for cardiovascualr benefit without pain limiting   Status New   Target Date 01/18/17     PT LONG TERM GOAL #4   Title patient to improve L LE strength to >/= 4+/5   Status New   Target Date 01/18/17     PT LONG TERM GOAL #5   Title patient to report ability to perform household chores and ADLs without pain limiting   Status New   Target Date 01/18/17                Plan - 12/07/16 1523    Clinical Impression Statement Ms. Mankowski is a pleasant 83 y/o female presenting to [place] today with primary complaints of L sided low back pain that is exacerbated with prolonged standing and walking with N&T radiating into L anterior thigh. Patient today with some limited AROM at lumbar spine with extension  and L side bending most provocative. Patient also with reduced strength of L LE as compared to R LE likely contributing to reduced lumbopelvic strength and stability. Initial HEP given today for gentle stretching with good carryover. Patient to benefit form PT to address pain and functional mobility restrictions to allow for improved quality of life.    Clinical Presentation Evolving   Clinical Presentation due to: lung cancer, osteoporosis, DM 2, HTN   Clinical Decision Making Moderate   Rehab Potential Good   PT Frequency 2x / week   PT Duration 6 weeks   PT Treatment/Interventions ADLs/Self Care Home Management;Cryotherapy;Electrical Stimulation;Iontophoresis 4mg /ml Dexamethasone;Moist Heat;Ultrasound;Neuromuscular re-education;Balance training;Therapeutic exercise;Therapeutic activities;Functional mobility training;Stair training;Gait training;Patient/family education;Manual techniques;Passive range of motion;Vasopneumatic Device;Taping;Dry needling   Consulted and Agree with Plan of Care  Patient      Patient will benefit from skilled therapeutic intervention in order to improve the following deficits and impairments:  Decreased activity tolerance, Decreased range of motion, Decreased mobility, Decreased strength, Difficulty walking, Pain  Visit Diagnosis: Chronic left-sided low back pain, with sciatica presence unspecified  Abnormal posture  Muscle weakness (generalized)  Difficulty in walking, not elsewhere classified      G-Codes - 12-24-16 1538    Functional Assessment Tool Used (Outpatient Only) FOTO: 46 (54% limited, predicted 45% limited)   Functional Limitation Mobility: Walking and moving around   Mobility: Walking and Moving Around Current Status (H0623) At least 40 percent but less than 60 percent impaired, limited or restricted   Mobility: Walking and Moving Around Goal Status [phone]) At least 40 percent but less than 60 percent impaired, limited or restricted       Problem List Patient Active Problem List   Diagnosis Date Noted  . Bilateral hearing loss 08/27/2016  . Nasal fracture 08/27/2016  . Knee injury 08/27/2016  . Head injury 08/27/2016  . Toxic multinodular goiter w/o crisis 06/22/2016  . Fatigue 05/05/2016  . Macular degeneration 03/09/2016  . Encounter for antineoplastic chemotherapy 12/19/2015  . Meralgia paresthetica of left side 07/10/2015  . Osteoporosis 07/10/2015  . Onychomycosis 07/10/2015  . Anxiety 07/10/2015  . Diabetes ([place]) 07/10/2015  . Cough 12/12/2013  . Pneumothorax after biopsy 07/09/2011    Class: Acute  . Primary adenocarcinoma of lower lobe of right lung ([place]) 06/24/2011  . Hypercholesterolemia 10/20/2010  . Essential hypertension, benign 10/20/2010  . Osteoarthritis 10/20/2010    Lanney Gins, PT, DPT 12-24-16 4:56 PM   [facility] [address]  [place], [place], [address] Phone: [phone]   Fax:  [phone]  Name: KYNNEDY CARRENO MRN: [id number] Date of Birth: 02-27-1928

## 2016-12-10 ENCOUNTER — Other Ambulatory Visit: Payer: Self-pay | Admitting: *Deleted

## 2016-12-10 ENCOUNTER — Ambulatory Visit (HOSPITAL_BASED_OUTPATIENT_CLINIC_OR_DEPARTMENT_OTHER): Payer: Medicare Other | Admitting: Family

## 2016-12-10 ENCOUNTER — Ambulatory Visit: Payer: Medicare Other | Admitting: Physical Therapy

## 2016-12-10 VITALS — BP 153/67 | HR 74 | Temp 97.8°F | Resp 16 | Wt 128.0 lb

## 2016-12-10 DIAGNOSIS — B029 Zoster without complications: Secondary | ICD-10-CM

## 2016-12-10 DIAGNOSIS — R293 Abnormal posture: Secondary | ICD-10-CM | POA: Diagnosis not present

## 2016-12-10 DIAGNOSIS — C3491 Malignant neoplasm of unspecified part of right bronchus or lung: Secondary | ICD-10-CM | POA: Diagnosis not present

## 2016-12-10 DIAGNOSIS — M545 Low back pain: Principal | ICD-10-CM

## 2016-12-10 DIAGNOSIS — G8929 Other chronic pain: Secondary | ICD-10-CM

## 2016-12-10 DIAGNOSIS — M6281 Muscle weakness (generalized): Secondary | ICD-10-CM | POA: Diagnosis not present

## 2016-12-10 DIAGNOSIS — R262 Difficulty in walking, not elsewhere classified: Secondary | ICD-10-CM

## 2016-12-10 MED ORDER — FAMCICLOVIR 500 MG PO TABS: 500.0000 mg | ORAL_TABLET | Freq: Three times a day (TID) | ORAL | 0 refills | Status: DC

## 2016-12-10 NOTE — Progress Notes (Signed)
Hematology and Oncology Follow Up Visit  Samantha Quinn [id number] 1927-10-13 83 y.o. 12/10/2016   Principle Diagnosis:  Stage IV adenocarcinoma of the right lung-mutation negative Shingles of right lower extremity  Current Therapy:   Tecentriq 1200 mg IV every 3 weeks s/p cycle 7  Past Therapy: Status post radiation/chemotherapy for recurrence-completed October 2017 Status post right upper lobectomy in March 2013 for stage IB disease   Interim History:  Samantha Quinn is here today with c/o blisters on her right elbow. There are several vesicles present in that area suspicious for shingles. No weeping or oozing at this time. She has had itching that resolved with hydrocortisone cream. She denies having and aches or pains at this time with the exception of her left shoulder. She attributes that to PT.  She undressed and has no other areas visible on her front or back.  She has had no symptoms of infection. No fever, chills, n/v, cough, rash, dizziness, SOB, chest pain, palpitations, abdominal pain or changes in bowel or bladder habits.  No constipation or diarrhea.  No swelling, tenderness, numbness or tingling in her extremities.  She has a good appetite and is staying well hydrated.   ECOG Performance Status: 1 - Symptomatic but completely ambulatory  Medications:  Allergies as of 12/10/2016      Reactions   Codeine Nausea And Vomiting   Tramadol Other (See Comments)   syncope   Demerol Nausea Only      Medication List       Accurate as of 12/10/16  2:40 PM. Always use your most recent med list.          amitriptyline 10 MG tablet Commonly known as:  ELAVIL Take 1 tablet (10 mg total) by mouth at bedtime.   bisoprolol-hydrochlorothiazide 2.5-6.25 MG tablet Commonly known as:  ZIAC TAKE 1 TABLET DAILY   calcium carbonate 600 MG tablet Commonly known as:  OS-CAL Take 600 mg by mouth daily.   famciclovir 500 MG tablet Commonly known as:  FAMVIR Take 1 tablet (500 mg  total) by mouth 3 (three) times daily.   gabapentin 100 MG capsule Commonly known as:  NEURONTIN Take 100 mg by mouth 3 (three) times daily.   glucose blood test strip Commonly known as:  ONE TOUCH ULTRA TEST 1 each by Other route 2 (two) times daily. Use to check blood sugars twice a day   HYDROcodone-homatropine 5-1.5 MG/5ML syrup Commonly known as:  HYCODAN Take 5 mLs by mouth every 6 (six) hours as needed for cough.   ibuprofen 200 MG tablet Commonly known as:  ADVIL,MOTRIN Take 200 mg by mouth every 6 (six) hours as needed for fever, headache or mild pain.   lovastatin 20 MG tablet Commonly known as:  MEVACOR TAKE 1 TABLET BY MOUTH EVERY OTHER DAY   metFORMIN 500 MG tablet Commonly known as:  GLUCOPHAGE TAKE 1 TABLET 2 TIMES DAILY   methimazole 5 MG tablet Commonly known as:  TAPAZOLE Take 0.5 tablets (2.5 mg total) by mouth daily.   ONE TOUCH ULTRA 2 w/Device Kit Use to check blood sugars twice a day Dx E11.9   onetouch ultrasoft lancets 1 each by Other route 2 (two) times daily. Use to help check blood sugars twice a day   VISION-VITE PRESERVE PO Take by mouth 2 (two) times daily.       Allergies:  Allergies  Allergen Reactions  . Codeine Nausea And Vomiting  . Tramadol Other (See Comments)    syncope  .  Demerol Nausea Only    Past Medical History, Surgical history, Social history, and Family History were reviewed and updated.  Review of Systems: All other 10 point review of systems is negative.   Physical Exam:  weight is 128 lb (58.1 kg). Her oral temperature is 97.8 F (36.6 C). Her blood pressure is 153/67 (abnormal) and her pulse is 74. Her respiration is 16 and oxygen saturation is 99%.   Wt Readings from Last 3 Encounters:  12/10/16 128 lb (58.1 kg)  12/02/16 129 lb (58.5 kg)  11/18/16 129 lb (58.5 kg)    Ocular: Sclerae unicteric, pupils equal, round and reactive to light Ear-nose-throat: Oropharynx clear, dentition fair Lymphatic: No  cervical, supraclavicular or axillary adenopathy Lungs no rales or rhonchi, good excursion bilaterally Heart regular rate and rhythm, no murmur appreciated Abd soft, nontender, positive bowel sounds, no liver or spleen tip palpated on exam, no fluid wave  MSK no focal spinal tenderness, no joint edema Neuro: non-focal, well-oriented, appropriate affect Breasts: Deferred this visit   Lab Results  Component Value Date   WBC 5.0 12/02/2016   HGB 12.5 12/02/2016   HCT 37.9 12/02/2016   MCV 85 12/02/2016   PLT 199 12/02/2016   No results found for: FERRITIN, IRON, TIBC, UIBC, IRONPCTSAT Lab Results  Component Value Date   RBC 4.44 12/02/2016   No results found for: KPAFRELGTCHN, LAMBDASER, KAPLAMBRATIO No results found for: IGGSERUM, IGA, IGMSERUM No results found for: Odetta Pink, SPEI   Chemistry      Component Value Date/Time   NA 139 12/02/2016 0836   NA 139 06/08/2016 1316   K 4.2 12/02/2016 0836   K 3.7 06/08/2016 1316   CL 103 12/02/2016 0836   CO2 27 12/02/2016 0836   CO2 24 06/08/2016 1316   BUN 14 12/02/2016 0836   BUN 13.3 06/08/2016 1316   CREATININE 0.9 12/02/2016 0836   CREATININE 0.7 06/08/2016 1316      Component Value Date/Time   CALCIUM 9.5 12/02/2016 0836   CALCIUM 9.8 06/08/2016 1316   ALKPHOS 84 12/02/2016 0836   ALKPHOS 79 06/08/2016 1316   AST 21 12/02/2016 0836   AST 14 06/08/2016 1316   ALT 21 12/02/2016 0836   ALT 11 06/08/2016 1316   BILITOT 0.80 12/02/2016 0836   BILITOT 0.25 06/08/2016 1316      Impression and Plan: Samantha Quinn is a very pleasant 83 yo WF with recurrent non-small cell lung cancer. She is currently being treated with Tecentriq once every 3 weeks and has done well. She is here today with c/o blisters on her right elbow with several vesicles.  She has had some itching at the site relieved with hydrocortisone cream. She is otherwise asymptomatic.  We will have her start  Famvir today TID for 7 days. [facility] [place].  Greater than 50% of her 15 minute face to face visit was spent counseling and coordinating care.  She will follow-up with Dr. Marin Olp on 8/29. She promises to contact our office if her rash/symptoms worsen and to go to the ED in the event of an emergency.   Eliezer Bottom, NP 8/16/20182:40 PM

## 2016-12-10 NOTE — Patient Instructions (Signed)
Strengthening: Straight Leg Raise (Phase 1)   Tighten muscles on front of right thigh, then lift leg __~8__ inches from surface, keeping knee locked.  Repeat __15__ times per set. Do __2__ sets per session.   Bridge   Lie back, legs bent. Inhale, pressing hips up. Keeping ribs in, lengthen lower back. Exhale, rolling down along spine from top. Repeat __15_ times. Do __2__ sessions per day.  Pelvic Tilt: Posterior - Legs Bent (Supine)   Tighten stomach and flatten back by rolling pelvis down. Hold __5-10__ seconds. Relax. Repeat __15__ times per set. Do __2__ sets per session.   Piriformis Stretch   Lying on back, pull right knee toward opposite shoulder. Hold __30__ seconds. Repeat _3___ times. Do __2__ sessions per day.  Resistive Band Rowing   With resistive band anchored in door, grasp both ends. Keeping elbows bent, pull back, squeezing shoulder blades together. Hold __5__ seconds. Repeat __15__ times. Do __2__ sessions per day.

## 2016-12-10 NOTE — Therapy (Signed)
[facility] [address]  [place], [place], [address] Phone: [phone]   Fax:  [phone]  Physical Therapy Treatment  Patient Details  Name: Samantha Quinn MRN: [id number] Date of Birth: October 15, 1927 Referring Provider: Dr. Melton Krebs  Encounter Date: 12/10/2016      PT End of Session - 12/10/16 0834    Visit Number 2   Number of Visits 12   Date for PT Re-Evaluation 01/18/17   Authorization Type Medicare   PT Start Time 0830   PT Stop Time 0915   PT Time Calculation (min) 45 min   Activity Tolerance Patient tolerated treatment well   Behavior During Therapy [facility] for tasks assessed/performed      Past Medical History:  Diagnosis Date  . Back pain    bulding disc  . Bronchitis    hx of-many,many years   . Diabetes mellitus   . Diabetes mellitus    type 2 and takes Metformin bid  . Dyslipidemia   . Embolism - blood clot    hx of in left lower leg and was on Coumadin for about 52months;this was about 8-30yrs ago  . Family history of adverse reaction to anesthesia    Daughter- Nausea  . History of colonic polyps   . HOH (hard of hearing)   . Hx of migraines    as a teenager  . Hypercholesterolemia   . Hyperlipidemia    takes Lovastatin every other day  . Hypertension    takes Ziac daily and Ramipril as well  . Hypothyroidism    takes Synthroid every other day  . Insomnia    takes Elavil nightly  . Joint pain   . Lung mass   . Myalgia   . Osteoarthritis   . Osteoporosis   . Pneumothorax of right lung after biopsy 07/09/11   HAD CHEST TUBE PLACEMENT  . PONV (postoperative nausea and vomiting)   . Primary adenocarcinoma of lower lobe of right lung ([place]) 06/24/2011   pT2a, pN0 M0 Stage 1 Well differenced Adenocarcinoma 3.5 cm resected 07/17/2011  . Primary adenocarcinoma of lung ([place]) 06/24/2011  . Shortness of breath dyspnea    With exertion  . Skin cancer    legs have dark spots on them  .  Upper respiratory infection 04/2011  . Urinary incontinence    takes Detrol daily prn    Past Surgical History:  Procedure Laterality Date  . ABDOMINAL HYSTERECTOMY    . APPENDECTOMY     as a child  . CHEST TUBE REMOVAL  07/13/11   RIGHT  CHEST TUBE  . CHOLECYSTECTOMY    . COLONOSCOPY    . COLONOSCOPY    . DILATION AND CURETTAGE OF UTERUS     x 2  . EYE SURGERY Bilateral    Cataract  . HEMORRHOID SURGERY    . LUNG BIOPSY  07/09/11   RIGHT UPER LOBE LUNG MASS   . RIGHT CHEST TUBE PLACEMENT  07/10/11   S/P LUNG BX  . ROTATOR CUFF REPAIR     bilateral  . TOE SURGERY  2013   right small toe  . TONSILLECTOMY     as a child  . VIDEO BRONCHOSCOPY  07/17/2011   Procedure: VIDEO BRONCHOSCOPY;  Surgeon: Grace Isaac, MD;  Location: [facility];  Service: Thoracic;  Laterality: N/A;  . VIDEO BRONCHOSCOPY WITH ENDOBRONCHIAL ULTRASOUND N/A 12/10/2015   Procedure: VIDEO BRONCHOSCOPY WITH ENDOBRONCHIAL ULTRASOUND with biopsies.;  Surgeon:  Grace Isaac, MD;  Location: [facility];  Service: Thoracic;  Laterality: N/A;    There were no vitals filed for this visit.      Subjective Assessment - 12/10/16 0830    Subjective having difficulty with piriformis stretch; also reporting L shoulder pain with leg movements and when she coughs   Pertinent History lung cancer, DM 2, HTN, osteoporosis   Diagnostic tests Xray and MRI: "ruptured disc" per patient   Patient Stated Goals improve pain and function   Currently in Pain? No/denies   Pain Score 0-No pain                         [facility] Adult PT Treatment/Exercise - 12/10/16 0836      Lumbar Exercises: Stretches   Quadruped Mid Back Stretch Limitations red ball rollout - 5 reps of fwd/right/left   Piriformis Stretch 3 reps;30 seconds   Piriformis Stretch Limitations B - supine knee to opposite chest     Lumbar Exercises: Standing   Row Both;15 reps   Theraband Level (Row) Level 2 (Red)     Lumbar Exercises: Supine   Ab Set  10 reps;5 seconds   Clam 10 reps;3 seconds   Clam Limitations B: red tband; VC for core activation   Bridge 10 reps;3 seconds   Straight Leg Raise 10 reps   Straight Leg Raises Limitations bilateral   Isometric Hip Flexion 15 reps;5 seconds   Isometric Hip Flexion Limitations feet on orange pball     Knee/Hip Exercises: Aerobic   Nustep L5 x 6 minutes (BUE/BLE)                  PT Short Term Goals - 12/10/16 3785      PT SHORT TERM GOAL #1   Title patient to be independent with initial HEP for lumbar AROM and stretching    Status On-going           PT Long Term Goals - 12/10/16 0835      PT LONG TERM GOAL #1   Title patient to be independent with advanced HEP    Status On-going     PT LONG TERM GOAL #2   Title patient to improve lumbar AROM to [facility] in all planes without pain limiting   Status On-going     PT LONG TERM GOAL #3   Title patient to report ability to ambulate for >/= 30 min for cardiovascualr benefit without pain limiting   Status On-going     PT LONG TERM GOAL #4   Title patient to improve L LE strength to >/= 4+/5   Status On-going     PT LONG TERM GOAL #5   Title patient to report ability to perform household chores and ADLs without pain limiting   Status On-going               Plan - 12/10/16 8850    Clinical Impression Statement Good progression today of all hip and core strengthening based activities wiht no increase in pain. Paitnet does report L shoulder pain - of unknown origin - will keep an eye on, as patient did experience a fall approx 3 months ago. Updated HEP today with good carryover.    PT Treatment/Interventions ADLs/Self Care Home Management;Cryotherapy;Electrical Stimulation;Iontophoresis 4mg /ml Dexamethasone;Moist Heat;Ultrasound;Neuromuscular re-education;Balance training;Therapeutic exercise;Therapeutic activities;Functional mobility training;Stair training;Gait training;Patient/family education;Manual  techniques;Passive range of motion;Vasopneumatic Device;Taping;Dry needling   Consulted and Agree with Plan of Care Patient  Patient will benefit from skilled therapeutic intervention in order to improve the following deficits and impairments:  Decreased activity tolerance, Decreased range of motion, Decreased mobility, Decreased strength, Difficulty walking, Pain  Visit Diagnosis: Chronic left-sided low back pain, with sciatica presence unspecified  Abnormal posture  Muscle weakness (generalized)  Difficulty in walking, not elsewhere classified     Problem List Patient Active Problem List   Diagnosis Date Noted  . Bilateral hearing loss 08/27/2016  . Nasal fracture 08/27/2016  . Knee injury 08/27/2016  . Head injury 08/27/2016  . Toxic multinodular goiter w/o crisis 06/22/2016  . Fatigue 05/05/2016  . Macular degeneration 03/09/2016  . Encounter for antineoplastic chemotherapy 12/19/2015  . Meralgia paresthetica of left side 07/10/2015  . Osteoporosis 07/10/2015  . Onychomycosis 07/10/2015  . Anxiety 07/10/2015  . Diabetes ([place]) 07/10/2015  . Cough 12/12/2013  . Pneumothorax after biopsy 07/09/2011    Class: Acute  . Primary adenocarcinoma of lower lobe of right lung ([place]) 06/24/2011  . Hypercholesterolemia 10/20/2010  . Essential hypertension, benign 10/20/2010  . Osteoarthritis 10/20/2010     Lanney Gins, PT, DPT 12/10/16 9:21 AM   [facility] [address]  [place], [place], [address] Phone: [phone]   Fax:  [phone]  Name: JANDI SWIGER MRN: [id number] Date of Birth: 11/24/1927

## 2016-12-11 MED FILL — FAMCICLOVIR 500 MG TABLET: 500 | 7 days supply | Qty: 21 | Fill #0

## 2016-12-15 ENCOUNTER — Ambulatory Visit: Payer: Medicare Other | Admitting: Physical Therapy

## 2016-12-15 DIAGNOSIS — M545 Low back pain: Secondary | ICD-10-CM | POA: Diagnosis not present

## 2016-12-15 DIAGNOSIS — G8929 Other chronic pain: Secondary | ICD-10-CM | POA: Diagnosis not present

## 2016-12-15 DIAGNOSIS — M6281 Muscle weakness (generalized): Secondary | ICD-10-CM

## 2016-12-15 DIAGNOSIS — R293 Abnormal posture: Secondary | ICD-10-CM | POA: Diagnosis not present

## 2016-12-15 DIAGNOSIS — R262 Difficulty in walking, not elsewhere classified: Secondary | ICD-10-CM

## 2016-12-15 NOTE — Therapy (Signed)
[facility] [address]  [place], [place], [address] Phone: [phone]   Fax:  [phone]  Physical Therapy Treatment  Patient Details  Name: Samantha Quinn MRN: [id number] Date of Birth: Jul 25, 1927 Referring Provider: Dr. Melton Krebs  Encounter Date: 12/15/2016      PT End of Session - 12/15/16 1058    Visit Number 3   Number of Visits 12   Date for PT Re-Evaluation 01/18/17   Authorization Type Medicare   PT Start Time 1013   PT Stop Time 1058   PT Time Calculation (min) 45 min   Activity Tolerance Patient tolerated treatment well   Behavior During Therapy [employer] for tasks assessed/performed      Past Medical History:  Diagnosis Date  . Back pain    bulding disc  . Bronchitis    hx of-many,many years   . Diabetes mellitus   . Diabetes mellitus    type 2 and takes Metformin bid  . Dyslipidemia   . Embolism - blood clot    hx of in left lower leg and was on Coumadin for about 31months;this was about 8-32yrs ago  . Family history of adverse reaction to anesthesia    Daughter- Nausea  . History of colonic polyps   . HOH (hard of hearing)   . Hx of migraines    as a teenager  . Hypercholesterolemia   . Hyperlipidemia    takes Lovastatin every other day  . Hypertension    takes Ziac daily and Ramipril as well  . Hypothyroidism    takes Synthroid every other day  . Insomnia    takes Elavil nightly  . Joint pain   . Lung mass   . Myalgia   . Osteoarthritis   . Osteoporosis   . Pneumothorax of right lung after biopsy 07/09/11   HAD CHEST TUBE PLACEMENT  . PONV (postoperative nausea and vomiting)   . Primary adenocarcinoma of lower lobe of right lung ([place]) 06/24/2011   pT2a, pN0 M0 Stage 1 Well differenced Adenocarcinoma 3.5 cm resected 07/17/2011  . Primary adenocarcinoma of lung ([place]) 06/24/2011  . Shortness of breath dyspnea    With exertion  . Skin cancer    legs have dark spots on them  .  Upper respiratory infection 04/2011  . Urinary incontinence    takes Detrol daily prn    Past Surgical History:  Procedure Laterality Date  . ABDOMINAL HYSTERECTOMY    . APPENDECTOMY     as a child  . CHEST TUBE REMOVAL  07/13/11   RIGHT  CHEST TUBE  . CHOLECYSTECTOMY    . COLONOSCOPY    . COLONOSCOPY    . DILATION AND CURETTAGE OF UTERUS     x 2  . EYE SURGERY Bilateral    Cataract  . HEMORRHOID SURGERY    . LUNG BIOPSY  07/09/11   RIGHT UPER LOBE LUNG MASS   . RIGHT CHEST TUBE PLACEMENT  07/10/11   S/P LUNG BX  . ROTATOR CUFF REPAIR     bilateral  . TOE SURGERY  2013   right small toe  . TONSILLECTOMY     as a child  . VIDEO BRONCHOSCOPY  07/17/2011   Procedure: VIDEO BRONCHOSCOPY;  Surgeon: Grace Isaac, MD;  Location: [facility];  Service: Thoracic;  Laterality: N/A;  . VIDEO BRONCHOSCOPY WITH ENDOBRONCHIAL ULTRASOUND N/A 12/10/2015   Procedure: VIDEO BRONCHOSCOPY WITH ENDOBRONCHIAL ULTRASOUND with biopsies.;  Surgeon:  Grace Isaac, MD;  Location: [facility];  Service: Thoracic;  Laterality: N/A;    There were no vitals filed for this visit.      Subjective Assessment - 12/15/16 1015    Subjective Hasn't noticed a difference in back pain   Pertinent History lung cancer, DM 2, HTN, osteoporosis   Diagnostic tests Xray and MRI: "ruptured disc" per patient   Patient Stated Goals improve pain and function   Currently in Pain? No/denies   Pain Score 0-No pain                         [facility] Adult PT Treatment/Exercise - 12/15/16 1023      Exercises   Exercises Knee/Hip;Lumbar     Lumbar Exercises: Standing   Row Both;15 reps   Theraband Level (Row) Level 2 (Red)     Lumbar Exercises: Seated   Long Arc Quad on Chair Both;2 sets;10 reps   LAQ on Chair Limitations 2nd set with alternating UE flexion   Hip Flexion on Ball Both;10 reps  2 sets;   Hip Flexion on Ball Limitations sitting EOM; 2nd set with alternating UE flexion   Sit to Stand 10 reps   2 sets   Sit to Stand Limitations 2nd set standing on AirEx   Other Seated Lumbar Exercises sciatic nerve glide - L LE x 10 reps     Lumbar Exercises: Supine   Bridge 10 reps;3 seconds     Knee/Hip Exercises: Aerobic   Nustep L5 x 6 minutes (BUE/BLE)                  PT Short Term Goals - 12/10/16 2355      PT SHORT TERM GOAL #1   Title patient to be independent with initial HEP for lumbar AROM and stretching    Status On-going           PT Long Term Goals - 12/10/16 0835      PT LONG TERM GOAL #1   Title patient to be independent with advanced HEP    Status On-going     PT LONG TERM GOAL #2   Title patient to improve lumbar AROM to [facility] in all planes without pain limiting   Status On-going     PT LONG TERM GOAL #3   Title patient to report ability to ambulate for >/= 30 min for cardiovascualr benefit without pain limiting   Status On-going     PT LONG TERM GOAL #4   Title patient to improve L LE strength to >/= 4+/5   Status On-going     PT LONG TERM GOAL #5   Title patient to report ability to perform household chores and ADLs without pain limiting   Status On-going               Plan - 12/15/16 1152    Clinical Impression Statement Patient finding it difficult to describe when/where she has low back pain today - greatest frustration with intermittent L shoulder pain - with some TTP to deep palpation with taut band noted. Education on self massage at home with ball with good carryover. Sciatic nerve glide also distributed due to reports of some radicular symtpoms.   PT Treatment/Interventions ADLs/Self Care Home Management;Cryotherapy;Electrical Stimulation;Iontophoresis 4mg /ml Dexamethasone;Moist Heat;Ultrasound;Neuromuscular re-education;Balance training;Therapeutic exercise;Therapeutic activities;Functional mobility training;Stair training;Gait training;Patient/family education;Manual techniques;Passive range of motion;Vasopneumatic  Device;Taping;Dry needling   Consulted and Agree with Plan of Care Patient  Patient will benefit from skilled therapeutic intervention in order to improve the following deficits and impairments:  Decreased activity tolerance, Decreased range of motion, Decreased mobility, Decreased strength, Difficulty walking, Pain  Visit Diagnosis: Chronic left-sided low back pain, with sciatica presence unspecified  Abnormal posture  Muscle weakness (generalized)  Difficulty in walking, not elsewhere classified     Problem List Patient Active Problem List   Diagnosis Date Noted  . Bilateral hearing loss 08/27/2016  . Nasal fracture 08/27/2016  . Knee injury 08/27/2016  . Head injury 08/27/2016  . Toxic multinodular goiter w/o crisis 06/22/2016  . Fatigue 05/05/2016  . Macular degeneration 03/09/2016  . Encounter for antineoplastic chemotherapy 12/19/2015  . Meralgia paresthetica of left side 07/10/2015  . Osteoporosis 07/10/2015  . Onychomycosis 07/10/2015  . Anxiety 07/10/2015  . Diabetes ([place]) 07/10/2015  . Cough 12/12/2013  . Pneumothorax after biopsy 07/09/2011    Class: Acute  . Primary adenocarcinoma of lower lobe of right lung ([place]) 06/24/2011  . Hypercholesterolemia 10/20/2010  . Essential hypertension, benign 10/20/2010  . Osteoarthritis 10/20/2010     Lanney Gins, PT, DPT 12/15/16 12:00 PM   [facility] [address]  [place], [place], [address] Phone: [phone]   Fax:  [phone]  Name: Samantha Quinn MRN: [id number] Date of Birth: 02-20-1928

## 2016-12-17 ENCOUNTER — Ambulatory Visit: Payer: Medicare Other | Admitting: Physical Therapy

## 2016-12-17 DIAGNOSIS — R293 Abnormal posture: Secondary | ICD-10-CM | POA: Diagnosis not present

## 2016-12-17 DIAGNOSIS — G8929 Other chronic pain: Secondary | ICD-10-CM

## 2016-12-17 DIAGNOSIS — M6281 Muscle weakness (generalized): Secondary | ICD-10-CM | POA: Diagnosis not present

## 2016-12-17 DIAGNOSIS — R262 Difficulty in walking, not elsewhere classified: Secondary | ICD-10-CM

## 2016-12-17 DIAGNOSIS — M545 Low back pain: Secondary | ICD-10-CM | POA: Diagnosis not present

## 2016-12-17 NOTE — Therapy (Signed)
[facility] [address]  [place], [place], [address] Phone: [phone]   Fax:  [phone]  Physical Therapy Treatment  Patient Details  Name: Samantha Quinn MRN: [id number] Date of Birth: [birth year] Referring Provider: Dr. Melton Krebs  Encounter Date: 12/17/2016      PT End of Session - 12/17/16 0936    Visit Number 4   Number of Visits 12   Date for PT Re-Evaluation 01/18/17   Authorization Type Medicare   PT Start Time 0931   PT Stop Time 1015   PT Time Calculation (min) 44 min   Activity Tolerance Patient tolerated treatment well   Behavior During Therapy [facility] for tasks assessed/performed      Past Medical History:  Diagnosis Date  . Back pain    bulding disc  . Bronchitis    hx of-many,many years   . Diabetes mellitus   . Diabetes mellitus    type 2 and takes Metformin bid  . Dyslipidemia   . Embolism - blood clot    hx of in left lower leg and was on Coumadin for about 71months;this was about 8-80yrs ago  . Family history of adverse reaction to anesthesia    Daughter- Nausea  . History of colonic polyps   . HOH (hard of hearing)   . Hx of migraines    as a teenager  . Hypercholesterolemia   . Hyperlipidemia    takes Lovastatin every other day  . Hypertension    takes Ziac daily and Ramipril as well  . Hypothyroidism    takes Synthroid every other day  . Insomnia    takes Elavil nightly  . Joint pain   . Lung mass   . Myalgia   . Osteoarthritis   . Osteoporosis   . Pneumothorax of right lung after biopsy 07/09/11   HAD CHEST TUBE PLACEMENT  . PONV (postoperative nausea and vomiting)   . Primary adenocarcinoma of lower lobe of right lung ([place]) 06/24/2011   pT2a, pN0 M0 Stage 1 Well differenced Adenocarcinoma 3.5 cm resected 07/17/2011  . Primary adenocarcinoma of lung ([place]) 06/24/2011  . Shortness of breath dyspnea    With exertion  . Skin cancer    legs have dark spots on them  .  Upper respiratory infection 04/2011  . Urinary incontinence    takes Detrol daily prn    Past Surgical History:  Procedure Laterality Date  . ABDOMINAL HYSTERECTOMY    . APPENDECTOMY     as a child  . CHEST TUBE REMOVAL  07/13/11   RIGHT  CHEST TUBE  . CHOLECYSTECTOMY    . COLONOSCOPY    . COLONOSCOPY    . DILATION AND CURETTAGE OF UTERUS     x 2  . EYE SURGERY Bilateral    Cataract  . HEMORRHOID SURGERY    . LUNG BIOPSY  07/09/11   RIGHT UPER LOBE LUNG MASS   . RIGHT CHEST TUBE PLACEMENT  07/10/11   S/P LUNG BX  . ROTATOR CUFF REPAIR     bilateral  . TOE SURGERY  2013   right small toe  . TONSILLECTOMY     as a child  . VIDEO BRONCHOSCOPY  07/17/2011   Procedure: VIDEO BRONCHOSCOPY;  Surgeon: Grace Isaac, MD;  Location: [facility];  Service: Thoracic;  Laterality: N/A;  . VIDEO BRONCHOSCOPY WITH ENDOBRONCHIAL ULTRASOUND N/A 12/10/2015   Procedure: VIDEO BRONCHOSCOPY WITH ENDOBRONCHIAL ULTRASOUND with biopsies.;  Surgeon:  Grace Isaac, MD;  Location: [facility];  Service: Thoracic;  Laterality: N/A;    There were no vitals filed for this visit.      Subjective Assessment - 12/17/16 0935    Subjective back is feeling okay; shoulder is biggest complaint   Pertinent History lung cancer, DM 2, HTN, osteoporosis   Diagnostic tests Xray and MRI: "ruptured disc" per patient   Patient Stated Goals improve pain and function   Currently in Pain? Yes   Pain Score 2    Pain Location Shoulder   Pain Orientation Left   Pain Descriptors / Indicators Aching;Discomfort   Pain Type Acute pain                         [facility] Adult PT Treatment/Exercise - 12/17/16 0940      Exercises   Exercises Knee/Hip;Lumbar     Lumbar Exercises: Stretches   Quadruped Mid Back Stretch Limitations red ball rollout - 5 reps of fwd/right/left     Lumbar Exercises: Seated   Long Arc Quad on Chair Both;2 sets;10 reps   LAQ on Chair Limitations 2nd set with alternating UE flexion -  seated on disc   Hip Flexion on Ball Both;10 reps  2 sets   Hip Flexion on Ball Limitations sitting EOM on disc; 2nd set with alternating UE flexion   Other Seated Lumbar Exercises seated row - on disc - red tband x 15; seated paloff press - red tband x 15 each side - seated on disc     Knee/Hip Exercises: Aerobic   Nustep L5 x 6 minutes (BLE only)     Manual Therapy   Manual Therapy Taping   Kinesiotex Inhibit Muscle     Kinesiotix   Inhibit Muscle  L shoulder - impingement pattern for reduced tension through teres/infra group                  PT Short Term Goals - 12/10/16 1478      PT SHORT TERM GOAL #1   Title patient to be independent with initial HEP for lumbar AROM and stretching    Status On-going           PT Long Term Goals - 12/10/16 0835      PT LONG TERM GOAL #1   Title patient to be independent with advanced HEP    Status On-going     PT LONG TERM GOAL #2   Title patient to improve lumbar AROM to [facility] in all planes without pain limiting   Status On-going     PT LONG TERM GOAL #3   Title patient to report ability to ambulate for >/= 30 min for cardiovascualr benefit without pain limiting   Status On-going     PT LONG TERM GOAL #4   Title patient to improve L LE strength to >/= 4+/5   Status On-going     PT LONG TERM GOAL #5   Title patient to report ability to perform household chores and ADLs without pain limiting   Status On-going               Plan - 12/17/16 0936    Clinical Impression Statement Patient with conitnued reports of shoulder pain and discomfort. Taping applied to L shoulder for hopeful improved pain pattern. Patinet able to progress hip/core work on unstable seated surface with no increase in pain. Will continue to progress per patient tolerance.    PT  Treatment/Interventions ADLs/Self Care Home Management;Cryotherapy;Electrical Stimulation;Iontophoresis 4mg /ml Dexamethasone;Moist Heat;Ultrasound;Neuromuscular  re-education;Balance training;Therapeutic exercise;Therapeutic activities;Functional mobility training;Stair training;Gait training;Patient/family education;Manual techniques;Passive range of motion;Vasopneumatic Device;Taping;Dry needling   Consulted and Agree with Plan of Care Patient      Patient will benefit from skilled therapeutic intervention in order to improve the following deficits and impairments:  Decreased activity tolerance, Decreased range of motion, Decreased mobility, Decreased strength, Difficulty walking, Pain  Visit Diagnosis: Chronic left-sided low back pain, with sciatica presence unspecified  Abnormal posture  Muscle weakness (generalized)  Difficulty in walking, not elsewhere classified     Problem List Patient Active Problem List   Diagnosis Date Noted  . Bilateral hearing loss 08/27/2016  . Nasal fracture 08/27/2016  . Knee injury 08/27/2016  . Head injury 08/27/2016  . Toxic multinodular goiter w/o crisis 06/22/2016  . Fatigue 05/05/2016  . Macular degeneration 03/09/2016  . Encounter for antineoplastic chemotherapy 12/19/2015  . Meralgia paresthetica of left side 07/10/2015  . Osteoporosis 07/10/2015  . Onychomycosis 07/10/2015  . Anxiety 07/10/2015  . Diabetes ([place]) 07/10/2015  . Cough 12/12/2013  . Pneumothorax after biopsy 07/09/2011    Class: Acute  . Primary adenocarcinoma of lower lobe of right lung ([place]) 06/24/2011  . Hypercholesterolemia 10/20/2010  . Essential hypertension, benign 10/20/2010  . Osteoarthritis 10/20/2010     Lanney Gins, PT, DPT 12/17/16 10:23 AM   [facility] [address]  [place], [place], [address] Phone: [phone]   Fax:  [phone]  Name: Samantha Quinn MRN: [id number] Date of Birth: 1927-07-28

## 2016-12-22 ENCOUNTER — Ambulatory Visit: Payer: Medicare Other | Admitting: Physical Therapy

## 2016-12-23 ENCOUNTER — Ambulatory Visit (HOSPITAL_BASED_OUTPATIENT_CLINIC_OR_DEPARTMENT_OTHER): Payer: Medicare Other

## 2016-12-23 ENCOUNTER — Ambulatory Visit (HOSPITAL_BASED_OUTPATIENT_CLINIC_OR_DEPARTMENT_OTHER): Payer: Medicare Other | Admitting: Hematology & Oncology

## 2016-12-23 ENCOUNTER — Ambulatory Visit (HOSPITAL_BASED_OUTPATIENT_CLINIC_OR_DEPARTMENT_OTHER)
Admission: RE | Admit: 2016-12-23 | Discharge: 2016-12-23 | Disposition: A | Discharge status: Home / Self Care | Payer: Medicare Other | Source: Ambulatory Visit | Attending: Hematology & Oncology | Admitting: Hematology & Oncology

## 2016-12-23 ENCOUNTER — Other Ambulatory Visit (HOSPITAL_BASED_OUTPATIENT_CLINIC_OR_DEPARTMENT_OTHER): Payer: Medicare Other

## 2016-12-23 VITALS — BP 169/68 | HR 75 | Temp 98.8°F | Resp 18 | Wt 130.0 lb

## 2016-12-23 DIAGNOSIS — Z5112 Encounter for antineoplastic immunotherapy: Secondary | ICD-10-CM

## 2016-12-23 DIAGNOSIS — C3431 Malignant neoplasm of lower lobe, right bronchus or lung: Secondary | ICD-10-CM

## 2016-12-23 DIAGNOSIS — R937 Abnormal findings on diagnostic imaging of other parts of musculoskeletal system: Secondary | ICD-10-CM | POA: Insufficient documentation

## 2016-12-23 DIAGNOSIS — M25512 Pain in left shoulder: Secondary | ICD-10-CM | POA: Diagnosis not present

## 2016-12-23 DIAGNOSIS — M19012 Primary osteoarthritis, left shoulder: Secondary | ICD-10-CM | POA: Diagnosis not present

## 2016-12-23 LAB — CMP (CANCER CENTER ONLY)
ALBUMIN: 3.5 g/dL (ref 3.3–5.5)
ALK PHOS: 94 U/L — AB (ref 26–84)
ALT(SGPT): 16 U/L (ref 10–47)
AST: 23 U/L (ref 11–38)
BILIRUBIN TOTAL: 0.9 mg/dL (ref 0.20–1.60)
BUN, Bld: 16 mg/dL (ref 7–22)
CALCIUM: 9.4 mg/dL (ref 8.0–10.3)
CO2: 29 mEq/L (ref 18–33)
Chloride: 104 mEq/L (ref 98–108)
Creat: 0.7 mg/dl (ref 0.6–1.2)
Glucose, Bld: 169 mg/dL — ABNORMAL HIGH (ref 73–118)
POTASSIUM: 4 meq/L (ref 3.3–4.7)
Sodium: 142 mEq/L (ref 128–145)
Total Protein: 7.5 g/dL (ref 6.4–8.1)

## 2016-12-23 LAB — CBC WITH DIFFERENTIAL (CANCER CENTER ONLY)
BASO#: 0 10*3/uL (ref 0.0–0.2)
BASO%: 0.2 % (ref 0.0–2.0)
EOS%: 1.5 % (ref 0.0–7.0)
Eosinophils Absolute: 0.1 10*3/uL (ref 0.0–0.5)
HEMATOCRIT: 37.4 % (ref 34.8–46.6)
HEMOGLOBIN: 12.2 g/dL (ref 11.6–15.9)
LYMPH#: 0.5 10*3/uL — ABNORMAL LOW (ref 0.9–3.3)
LYMPH%: 9.4 % — ABNORMAL LOW (ref 14.0–48.0)
MCH: 28.3 pg (ref 26.0–34.0)
MCHC: 32.6 g/dL (ref 32.0–36.0)
MCV: 87 fL (ref 81–101)
MONO#: 0.4 10*3/uL (ref 0.1–0.9)
MONO%: 6.9 % (ref 0.0–13.0)
NEUT%: 82 % — ABNORMAL HIGH (ref 39.6–80.0)
NEUTROS ABS: 4.4 10*3/uL (ref 1.5–6.5)
Platelets: 223 10*3/uL (ref 145–400)
RBC: 4.31 10*6/uL (ref 3.70–5.32)
RDW: 15 % (ref 11.1–15.7)
WBC: 5.4 10*3/uL (ref 3.9–10.0)

## 2016-12-23 LAB — LACTATE DEHYDROGENASE: LDH: 197 U/L (ref 125–245)

## 2016-12-23 MED ORDER — SODIUM CHLORIDE 0.9 % IV SOLN
Freq: Once | INTRAVENOUS | Status: AC
  Administered 2016-12-23: 10:00:00 via INTRAVENOUS

## 2016-12-23 MED ORDER — ATEZOLIZUMAB CHEMO INJECTION 1200 MG/20ML
1200.0000 mg | Freq: Once | INTRAVENOUS | Status: AC
  Administered 2016-12-23: 1200 mg via INTRAVENOUS
  Filled 2016-12-23: qty 20

## 2016-12-23 NOTE — Progress Notes (Signed)
Dictation #1 [id number]  [id number]  Hematology and Oncology Follow Up Visit  KIMBREE CASANAS [id number] 08/15/1927 83 y.o. 12/23/2016   Principle Diagnosis:   Stage IV adenocarcinoma of the right lung-mutation negative  Current Therapy:    Tecentriq 1200 mg IV every 3 weeks s/p cycle #7  Status post radiation/chemotherapy for recurrence-completed October 2017  Status post right upper lobectomy in March 2013 for stage IB disease     Interim History:  Ms. Rumpf is back for follow-up. Her problem now is that she has a lot of pain in the left shoulder. She has had rotator cuff surgery in both shoulders. The rotator cuff surgery in the left shoulder was back in 2004 as far she can remember.  She may have hurt it when she fell back in May.  We did get a x-ray of the left shoulder. There is no lytic or blastic lesions. There was a "high humeral head" that indicated rotator cuff damage.  I did speak with Dr. Levora Dredge of orthopedic surgery. He will see her in a week or so. If she does need surgery, I don't see a problem with this.  Otherwise, she is doing pretty well. She is tolerated the Tecentriq quite nicely. She's had no nausea or vomiting. She is eating pretty well. She's had no bleeding. There is some slight diarrhea which is well-controlled.  Overall, her performance status is ECOG 2,   Medications:  Current Outpatient Prescriptions:  .  amitriptyline (ELAVIL) 10 MG tablet, Take 1 tablet (10 mg total) by mouth at bedtime. (Patient taking differently: Take 5 mg by mouth at bedtime. ), Disp: 90 tablet, Rfl: 1 .  bisoprolol-hydrochlorothiazide (ZIAC) 2.5-6.25 MG tablet, TAKE 1 TABLET DAILY, Disp: 90 tablet, Rfl: 3 .  Blood Glucose Monitoring Suppl (ONE TOUCH ULTRA 2) w/Device KIT, Use to check blood sugars twice a day Dx E11.9, Disp: 1 each, Rfl: 0 .  calcium carbonate (OS-CAL) 600 MG tablet, Take 600 mg by mouth daily., Disp: , Rfl:  .  gabapentin (NEURONTIN) 100 MG  capsule, Take 100 mg by mouth 3 (three) times daily. , Disp: , Rfl:  .  glucose blood (ONE TOUCH ULTRA TEST) test strip, 1 each by Other route 2 (two) times daily. Use to check blood sugars twice a day, Disp: 100 each, Rfl: 3 .  HYDROcodone-homatropine (HYCODAN) 5-1.5 MG/5ML syrup, Take 5 mLs by mouth every 6 (six) hours as needed for cough. (Patient taking differently: Take 2.5 mLs by mouth every 6 (six) hours as needed for cough. ), Disp: 180 mL, Rfl: 0 .  ibuprofen (ADVIL,MOTRIN) 200 MG tablet, Take 200 mg by mouth every 6 (six) hours as needed for fever, headache or mild pain. , Disp: , Rfl:  .  Lancets (ONETOUCH ULTRASOFT) lancets, 1 each by Other route 2 (two) times daily. Use to help check blood sugars twice a day, Disp: 100 each, Rfl: 3 .  lovastatin (MEVACOR) 20 MG tablet, TAKE 1 TABLET BY MOUTH EVERY OTHER DAY, Disp: 90 tablet, Rfl: 1 .  metFORMIN (GLUCOPHAGE) 500 MG tablet, TAKE 1 TABLET 2 TIMES DAILY, Disp: 180 tablet, Rfl: 1 .  methimazole (TAPAZOLE) 5 MG tablet, Take 0.5 tablets (2.5 mg total) by mouth daily. (Patient taking differently: Take 2.5 mg by mouth. Take one every other day.), Disp: 30 tablet, Rfl: 3 .  Multiple Vitamins-Minerals (VISION-VITE PRESERVE PO), Take by mouth 2 (two) times daily., Disp: , Rfl:   Allergies:  Allergies  Allergen Reactions  . Codeine Nausea  And Vomiting  . Tramadol Other (See Comments)    syncope  . Demerol Nausea Only    Past Medical History, Surgical history, Social history, and Family History were reviewed and updated.  Review of Systems: As stated in the interim history  Physical Exam:  weight is 130 lb (59 kg). Her oral temperature is 98.8 F (37.1 C). Her blood pressure is 169/68 (abnormal) and her pulse is 75. Her respiration is 18 and oxygen saturation is 99%.   Wt Readings from Last 3 Encounters:  12/23/16 130 lb (59 kg)  12/10/16 128 lb (58.1 kg)  12/02/16 129 lb (58.5 kg)     Physical Exam  Constitutional: She is oriented  to person, place, and time.  HENT:  Head: Normocephalic and atraumatic.  Mouth/Throat: Oropharynx is clear and moist.  Eyes: Pupils are equal, round, and reactive to light. EOM are normal.  Neck: Normal range of motion.  Cardiovascular: Normal rate, regular rhythm and normal heart sounds.   Pulmonary/Chest: Effort normal and breath sounds normal.  Abdominal: Soft. Bowel sounds are normal.  Musculoskeletal: Normal range of motion. She exhibits no edema, tenderness or deformity.  She does have tenderness in the posterior left shoulder. She has moderate decreased range of motion of the left shoulder. She has partial abduction of the left arm.  Lymphadenopathy:    She has no cervical adenopathy.  Neurological: She is alert and oriented to person, place, and time.  Skin: Skin is warm and dry. No rash noted. No erythema.  Psychiatric: She has a normal mood and affect. Her behavior is normal. Judgment and thought content normal.  Vitals reviewed.    Lab Results  Component Value Date   WBC 5.4 12/23/2016   HGB 12.2 12/23/2016   HCT 37.4 12/23/2016   MCV 87 12/23/2016   PLT 223 12/23/2016     Chemistry      Component Value Date/Time   NA 142 12/23/2016 0837   NA 139 06/08/2016 1316   K 4.0 12/23/2016 0837   K 3.7 06/08/2016 1316   CL 104 12/23/2016 0837   CO2 29 12/23/2016 0837   CO2 24 06/08/2016 1316   BUN 16 12/23/2016 0837   BUN 13.3 06/08/2016 1316   CREATININE 0.7 12/23/2016 0837   CREATININE 0.7 06/08/2016 1316      Component Value Date/Time   CALCIUM 9.4 12/23/2016 0837   CALCIUM 9.8 06/08/2016 1316   ALKPHOS 94 (H) 12/23/2016 0837   ALKPHOS 79 06/08/2016 1316   AST 23 12/23/2016 0837   AST 14 06/08/2016 1316   ALT 16 12/23/2016 0837   ALT 11 06/08/2016 1316   BILITOT 0.90 12/23/2016 0837   BILITOT 0.25 06/08/2016 1316         Impression and Plan: Ms. Baka is a 83 year old white female. She has recurrent non-small cell lung cancer. This appears to be  adenocarcinoma. She does not have any mutations that we can target.  We will go ahead with her eighth cycle of treatment. I will then repeat a CT scan after this. Hopefully, we will still see stable disease or maybe improved.  Her quality of life clearly as dictated by her shoulder. Hopefully, Dr. Rhona Raider will be able to help her. Again, if she needs surgery I see a problem with her having this.  I spent about 25 minutes with her. We dealt with her shoulder issues. I reviewed the x-ray with her. Her daughter was present.   We will see her back in one  month. I will give her an extra week off from treatment. We will get the CT scan the day that we see her.    [name], MD 8/29/201810:04 AM

## 2016-12-23 NOTE — Patient Instructions (Signed)
Atezolizumab injection What is this medicine? ATEZOLIZUMAB (a te zoe LIZ ue mab) is a monoclonal antibody. It is used to treat bladder cancer (urothelial cancer) and non-small cell lung cancer. This medicine may be used for other purposes; ask your health care provider or pharmacist if you have questions. COMMON BRAND NAME(S): Tecentriq What should I tell my health care provider before I take this medicine? They need to know if you have any of these conditions: -diabetes -immune system problems -infection -inflammatory bowel disease -liver disease -lung or breathing disease -lupus -nervous system problems like myasthenia gravis or Guillain-Barre syndrome -organ transplant -an unusual or allergic reaction to atezolizumab, other medicines, foods, dyes, or preservatives -pregnant or trying to get pregnant -breast-feeding How should I use this medicine? This medicine is for infusion into a vein. It is given by a health care professional in a hospital or clinic setting. A special MedGuide will be given to you before each treatment. Be sure to read this information carefully each time. Talk to your pediatrician regarding the use of this medicine in children. Special care may be needed. Overdosage: If you think you have taken too much of this medicine contact a poison control center or emergency room at once. NOTE: This medicine is only for you. Do not share this medicine with others. What if I miss a dose? It is important not to miss your dose. Call your doctor or health care professional if you are unable to keep an appointment. What may interact with this medicine? Interactions have not been studied. This list may not describe all possible interactions. Give your health care provider a list of all the medicines, herbs, non-prescription drugs, or dietary supplements you use. Also tell them if you smoke, drink alcohol, or use illegal drugs. Some items may interact with your medicine. What  should I watch for while using this medicine? Your condition will be monitored carefully while you are receiving this medicine. You may need blood work done while you are taking this medicine. Do not become pregnant while taking this medicine or for at least 5 months after stopping it. Women should inform their doctor if they wish to become pregnant or think they might be pregnant. There is a potential for serious side effects to an unborn child. Talk to your health care professional or pharmacist for more information. Do not breast-feed an infant while taking this medicine or for at least 5 months after the last dose. What side effects may I notice from receiving this medicine? Side effects that you should report to your doctor or health care professional as soon as possible: -allergic reactions like skin rash, itching or hives, swelling of the face, lips, or tongue -black, tarry stools -bloody or watery diarrhea -breathing problems -changes in vision -chest pain or chest tightness -chills -facial flushing -fever -headache -signs and symptoms of high blood sugar such as dizziness; dry mouth; dry skin; fruity breath; nausea; stomach pain; increased hunger or thirst; increased urination -signs and symptoms of liver injury like dark yellow or brown urine; general ill feeling or flu-like symptoms; light-colored stools; loss of appetite; nausea; right upper belly pain; unusually weak or tired; yellowing of the eyes or skin -stomach pain -trouble passing urine or change in the amount of urine Side effects that usually do not require medical attention (report to your doctor or health care professional if they continue or are bothersome): -cough -diarrhea -joint pain -muscle pain -muscle weakness -tiredness -weight loss This list may not describe all   possible side effects. Call your doctor for medical advice about side effects. You may report side effects to FDA at 1-800-FDA-1088. Where should  I keep my medicine? This drug is given in a hospital or clinic and will not be stored at home. NOTE: This sheet is a summary. It may not cover all possible information. If you have questions about this medicine, talk to your doctor, pharmacist, or health care provider.  2018 Elsevier/Gold Standard (2015-05-15 17:54:14)  

## 2016-12-24 ENCOUNTER — Ambulatory Visit: Payer: Medicare Other | Admitting: Physical Therapy

## 2016-12-24 DIAGNOSIS — R262 Difficulty in walking, not elsewhere classified: Secondary | ICD-10-CM

## 2016-12-24 DIAGNOSIS — M545 Low back pain: Principal | ICD-10-CM

## 2016-12-24 DIAGNOSIS — G8929 Other chronic pain: Secondary | ICD-10-CM

## 2016-12-24 DIAGNOSIS — R293 Abnormal posture: Secondary | ICD-10-CM

## 2016-12-24 DIAGNOSIS — M6281 Muscle weakness (generalized): Secondary | ICD-10-CM

## 2016-12-24 NOTE — Therapy (Signed)
[facility] [address]  [place], [place], [address] Phone: [phone]   Fax:  [phone]  Physical Therapy Treatment  Patient Details  Name: Samantha Quinn MRN: [id number] Date of Birth: 10-31-1927 Referring Provider: Dr. Melton Krebs  Encounter Date: 12/24/2016      PT End of Session - 12/24/16 1701    Visit Number 5   Number of Visits 12   Date for PT Re-Evaluation 01/18/17   Authorization Type Medicare   PT Start Time 1054   PT Stop Time 1139   PT Time Calculation (min) 45 min   Activity Tolerance Patient tolerated treatment well   Behavior During Therapy [facility] for tasks assessed/performed      Past Medical History:  Diagnosis Date  . Back pain    bulding disc  . Bronchitis    hx of-many,many years   . Diabetes mellitus   . Diabetes mellitus    type 2 and takes Metformin bid  . Dyslipidemia   . Embolism - blood clot    hx of in left lower leg and was on Coumadin for about 50months;this was about 8-17yrs ago  . Family history of adverse reaction to anesthesia    Daughter- Nausea  . History of colonic polyps   . HOH (hard of hearing)   . Hx of migraines    as a teenager  . Hypercholesterolemia   . Hyperlipidemia    takes Lovastatin every other day  . Hypertension    takes Ziac daily and Ramipril as well  . Hypothyroidism    takes Synthroid every other day  . Insomnia    takes Elavil nightly  . Joint pain   . Lung mass   . Myalgia   . Osteoarthritis   . Osteoporosis   . Pneumothorax of right lung after biopsy 07/09/11   HAD CHEST TUBE PLACEMENT  . PONV (postoperative nausea and vomiting)   . Primary adenocarcinoma of lower lobe of right lung ([place]) 06/24/2011   pT2a, pN0 M0 Stage 1 Well differenced Adenocarcinoma 3.5 cm resected 07/17/2011  . Primary adenocarcinoma of lung ([place]) 06/24/2011  . Shortness of breath dyspnea    With exertion  . Skin cancer    legs have dark spots on them  .  Upper respiratory infection 04/2011  . Urinary incontinence    takes Detrol daily prn    Past Surgical History:  Procedure Laterality Date  . ABDOMINAL HYSTERECTOMY    . APPENDECTOMY     as a child  . CHEST TUBE REMOVAL  07/13/11   RIGHT  CHEST TUBE  . CHOLECYSTECTOMY    . COLONOSCOPY    . COLONOSCOPY    . DILATION AND CURETTAGE OF UTERUS     x 2  . EYE SURGERY Bilateral    Cataract  . HEMORRHOID SURGERY    . LUNG BIOPSY  07/09/11   RIGHT UPER LOBE LUNG MASS   . RIGHT CHEST TUBE PLACEMENT  07/10/11   S/P LUNG BX  . ROTATOR CUFF REPAIR     bilateral  . TOE SURGERY  2013   right small toe  . TONSILLECTOMY     as a child  . VIDEO BRONCHOSCOPY  07/17/2011   Procedure: VIDEO BRONCHOSCOPY;  Surgeon: Grace Isaac, MD;  Location: [place];  Service: Thoracic;  Laterality: N/A;  . VIDEO BRONCHOSCOPY WITH ENDOBRONCHIAL ULTRASOUND N/A 12/10/2015   Procedure: VIDEO BRONCHOSCOPY WITH ENDOBRONCHIAL ULTRASOUND with biopsies.;  Surgeon:  Grace Isaac, MD;  Location: [place];  Service: Thoracic;  Laterality: N/A;    There were no vitals filed for this visit.      Subjective Assessment - 12/24/16 1658    Subjective Patient had infusion yesterday - feeling fatigued today - shoulder is biggest complaint - seeing ortho 01/04/17   Pertinent History lung cancer, DM 2, HTN, osteoporosis   Diagnostic tests Xray and MRI: "ruptured disc" per patient   Patient Stated Goals improve pain and function   Currently in Pain? Yes   Pain Score 5    Pain Location Shoulder   Pain Orientation Left                         [facility] Adult PT Treatment/Exercise - 12/24/16 0001      Lumbar Exercises: Standing   Functional Squats 15 reps   Functional Squats Limitations mini - VC for increased hipflexion     Lumbar Exercises: Supine   Clam 15 reps   Clam Limitations red tband   Bent Knee Raise 15 reps   Bent Knee Raise Limitations red tband   Bridge 15 reps;3 seconds   Bridge Limitations  red tband at knees     Knee/Hip Exercises: Aerobic   Nustep L5 x 6 minutes (BLE only)     Knee/Hip Exercises: Standing   Hip Abduction Both;15 reps;Knee straight   Abduction Limitations 2#   Hip Extension Both;15 reps;Knee straight   Extension Limitations 2#     Manual Therapy   Manual Therapy Taping   Kinesiotex Inhibit Muscle     Kinesiotix   Inhibit Muscle  L shoulder - impingement pattern for reduced tension through teres/infra group                  PT Short Term Goals - 12/10/16 1017      PT SHORT TERM GOAL #1   Title patient to be independent with initial HEP for lumbar AROM and stretching    Status On-going           PT Long Term Goals - 12/10/16 0835      PT LONG TERM GOAL #1   Title patient to be independent with advanced HEP    Status On-going     PT LONG TERM GOAL #2   Title patient to improve lumbar AROM to [facility] in all planes without pain limiting   Status On-going     PT LONG TERM GOAL #3   Title patient to report ability to ambulate for >/= 30 min for cardiovascualr benefit without pain limiting   Status On-going     PT LONG TERM GOAL #4   Title patient to improve L LE strength to >/= 4+/5   Status On-going     PT LONG TERM GOAL #5   Title patient to report ability to perform household chores and ADLs without pain limiting   Status On-going               Plan - 12/24/16 1703    Clinical Impression Statement Patient today noting no real significant change in back pain since beginning therapy, however L shoulder pain is patients primary complaint at this time. Patient feels as if her back is "fatigued" more so than painful. PT session focusing on hip and core strengthening with good tolerance. Taping conitnues to L shoulder for some hopeful pain relief until seen by ortho MD. WIll continue to progress as patient tolerates.  PT Treatment/Interventions ADLs/Self Care Home Management;Cryotherapy;Electrical Stimulation;Iontophoresis  4mg /ml Dexamethasone;Moist Heat;Ultrasound;Neuromuscular re-education;Balance training;Therapeutic exercise;Therapeutic activities;Functional mobility training;Stair training;Gait training;Patient/family education;Manual techniques;Passive range of motion;Vasopneumatic Device;Taping;Dry needling   Consulted and Agree with Plan of Care Patient      Patient will benefit from skilled therapeutic intervention in order to improve the following deficits and impairments:  Decreased activity tolerance, Decreased range of motion, Decreased mobility, Decreased strength, Difficulty walking, Pain  Visit Diagnosis: Chronic left-sided low back pain, with sciatica presence unspecified  Abnormal posture  Muscle weakness (generalized)  Difficulty in walking, not elsewhere classified     Problem List Patient Active Problem List   Diagnosis Date Noted  . Bilateral hearing loss 08/27/2016  . Nasal fracture 08/27/2016  . Knee injury 08/27/2016  . Head injury 08/27/2016  . Toxic multinodular goiter w/o crisis 06/22/2016  . Fatigue 05/05/2016  . Macular degeneration 03/09/2016  . Encounter for antineoplastic chemotherapy 12/19/2015  . Meralgia paresthetica of left side 07/10/2015  . Osteoporosis 07/10/2015  . Onychomycosis 07/10/2015  . Anxiety 07/10/2015  . Diabetes ([place]) 07/10/2015  . Cough 12/12/2013  . Pneumothorax after biopsy 07/09/2011    Class: Acute  . Primary adenocarcinoma of lower lobe of right lung ([place]) 06/24/2011  . Hypercholesterolemia 10/20/2010  . Essential hypertension, benign 10/20/2010  . Osteoarthritis 10/20/2010    Lanney Gins, PT, DPT 12/24/16 5:06 PM   [facility] [address]  [place], [place], [address] Phone: [phone]   Fax:  [phone]  Name: CORYNNE SCIBILIA MRN: [id number] Date of Birth: October 16, 1927

## 2016-12-29 ENCOUNTER — Ambulatory Visit: Payer: Medicare Other | Admitting: Physical Therapy

## 2016-12-30 ENCOUNTER — Ambulatory Visit: Payer: Medicare Other | Attending: Neurosurgery | Admitting: Physical Therapy

## 2016-12-30 DIAGNOSIS — G8929 Other chronic pain: Secondary | ICD-10-CM | POA: Diagnosis not present

## 2016-12-30 DIAGNOSIS — R293 Abnormal posture: Secondary | ICD-10-CM | POA: Diagnosis not present

## 2016-12-30 DIAGNOSIS — R262 Difficulty in walking, not elsewhere classified: Secondary | ICD-10-CM | POA: Insufficient documentation

## 2016-12-30 DIAGNOSIS — M6281 Muscle weakness (generalized): Secondary | ICD-10-CM | POA: Diagnosis not present

## 2016-12-30 DIAGNOSIS — M545 Low back pain: Secondary | ICD-10-CM | POA: Diagnosis not present

## 2016-12-30 DIAGNOSIS — M25512 Pain in left shoulder: Secondary | ICD-10-CM | POA: Insufficient documentation

## 2016-12-30 DIAGNOSIS — M542 Cervicalgia: Secondary | ICD-10-CM | POA: Insufficient documentation

## 2016-12-30 NOTE — Therapy (Signed)
[facility] [address]  [place], [place], [address] Phone: [phone]   Fax:  [phone]  Physical Therapy Treatment  Patient Details  Name: Samantha Quinn MRN: [id number] Date of Birth: December 18, 1927 Referring Provider: Dr. Melton Krebs  Encounter Date: 12/30/2016      PT End of Session - 12/30/16 1019    Visit Number 6   Number of Visits 12   Date for PT Re-Evaluation 01/18/17   Authorization Type Medicare   PT Start Time 1013   PT Stop Time 1058   PT Time Calculation (min) 45 min   Activity Tolerance Patient tolerated treatment well   Behavior During Therapy [facility] for tasks assessed/performed      Past Medical History:  Diagnosis Date  . Back pain    bulding disc  . Bronchitis    hx of-many,many years   . Diabetes mellitus   . Diabetes mellitus    type 2 and takes Metformin bid  . Dyslipidemia   . Embolism - blood clot    hx of in left lower leg and was on Coumadin for about 79months;this was about 8-45yrs ago  . Family history of adverse reaction to anesthesia    Daughter- Nausea  . History of colonic polyps   . HOH (hard of hearing)   . Hx of migraines    as a teenager  . Hypercholesterolemia   . Hyperlipidemia    takes Lovastatin every other day  . Hypertension    takes Ziac daily and Ramipril as well  . Hypothyroidism    takes Synthroid every other day  . Insomnia    takes Elavil nightly  . Joint pain   . Lung mass   . Myalgia   . Osteoarthritis   . Osteoporosis   . Pneumothorax of right lung after biopsy 07/09/11   HAD CHEST TUBE PLACEMENT  . PONV (postoperative nausea and vomiting)   . Primary adenocarcinoma of lower lobe of right lung ([place]) 06/24/2011   pT2a, pN0 M0 Stage 1 Well differenced Adenocarcinoma 3.5 cm resected 07/17/2011  . Primary adenocarcinoma of lung ([place]) 06/24/2011  . Shortness of breath dyspnea    With exertion  . Skin cancer    legs have dark spots on them  .  Upper respiratory infection 04/2011  . Urinary incontinence    takes Detrol daily prn    Past Surgical History:  Procedure Laterality Date  . ABDOMINAL HYSTERECTOMY    . APPENDECTOMY     as a child  . CHEST TUBE REMOVAL  07/13/11   RIGHT  CHEST TUBE  . CHOLECYSTECTOMY    . COLONOSCOPY    . COLONOSCOPY    . DILATION AND CURETTAGE OF UTERUS     x 2  . EYE SURGERY Bilateral    Cataract  . HEMORRHOID SURGERY    . LUNG BIOPSY  07/09/11   RIGHT UPER LOBE LUNG MASS   . RIGHT CHEST TUBE PLACEMENT  07/10/11   S/P LUNG BX  . ROTATOR CUFF REPAIR     bilateral  . TOE SURGERY  2013   right small toe  . TONSILLECTOMY     as a child  . VIDEO BRONCHOSCOPY  07/17/2011   Procedure: VIDEO BRONCHOSCOPY;  Surgeon: Grace Isaac, MD;  Location: [place];  Service: Thoracic;  Laterality: N/A;  . VIDEO BRONCHOSCOPY WITH ENDOBRONCHIAL ULTRASOUND N/A 12/10/2015   Procedure: VIDEO BRONCHOSCOPY WITH ENDOBRONCHIAL ULTRASOUND with biopsies.;  Surgeon:  Grace Isaac, MD;  Location: [place];  Service: Thoracic;  Laterality: N/A;    There were no vitals filed for this visit.      Subjective Assessment - 12/30/16 1017    Subjective feels like the shoulder may be feeling a little better; back may be a little better   Pertinent History lung cancer, DM 2, HTN, osteoporosis   Diagnostic tests Xray and MRI: "ruptured disc" per patient   Patient Stated Goals improve pain and function   Currently in Pain? No/denies   Pain Score 0-No pain                         [facility] Adult PT Treatment/Exercise - 12/30/16 1020      Lumbar Exercises: Seated   Long Arc Quad on [place] Both;15 reps   LAQ on [place] Limitations orange pball    Hip Flexion on Ball Both;15 reps   Hip Flexion on Ball Limitations orange pball   Other Seated Lumbar Exercises seated orange pball - hip adduction x 15 with 5 sec hold     Knee/Hip Exercises: Aerobic   Nustep L5 x 6 minutes (BLE only)                  PT  Short Term Goals - 12/10/16 8119      PT SHORT TERM GOAL #1   Title patient to be independent with initial HEP for lumbar AROM and stretching    Status On-going           PT Long Term Goals - 12/10/16 0835      PT LONG TERM GOAL #1   Title patient to be independent with advanced HEP    Status On-going     PT LONG TERM GOAL #2   Title patient to improve lumbar AROM to [facility] in all planes without pain limiting   Status On-going     PT LONG TERM GOAL #3   Title patient to report ability to ambulate for >/= 30 min for cardiovascualr benefit without pain limiting   Status On-going     PT LONG TERM GOAL #4   Title patient to improve L LE strength to >/= 4+/5   Status On-going     PT LONG TERM GOAL #5   Title patient to report ability to perform household chores and ADLs without pain limiting   Status On-going               Plan - 12/30/16 1607    Clinical Impression Statement Patient today with initial reports of arm feeling better, however, after only 1 activity utilizing B UE pain increased to 10/10, limiting tolerance to continued therapy today. PT providing comfort measures such as STM and moit heat with bracing of UE by PT into open pack position to allow for reduction in pain. Patient plans to see ortho MD on Monday, and will determine if returning to PT at that time.    PT Treatment/Interventions ADLs/Self Care Home Management;Cryotherapy;Electrical Stimulation;Iontophoresis 4mg /ml Dexamethasone;Moist Heat;Ultrasound;Neuromuscular re-education;Balance training;Therapeutic exercise;Therapeutic activities;Functional mobility training;Stair training;Gait training;Patient/family education;Manual techniques;Passive range of motion;Vasopneumatic Device;Taping;Dry needling   Consulted and Agree with Plan of Care Patient      Patient will benefit from skilled therapeutic intervention in order to improve the following deficits and impairments:  Decreased activity tolerance,  Decreased range of motion, Decreased mobility, Decreased strength, Difficulty walking, Pain  Visit Diagnosis: Chronic left-sided low back pain, with sciatica presence unspecified  Abnormal posture  Muscle weakness (generalized)  Difficulty in walking, not elsewhere classified     Problem List Patient Active Problem List   Diagnosis Date Noted  . Bilateral hearing loss 08/27/2016  . Nasal fracture 08/27/2016  . Knee injury 08/27/2016  . Head injury 08/27/2016  . Toxic multinodular goiter w/o crisis 06/22/2016  . Fatigue 05/05/2016  . Macular degeneration 03/09/2016  . Encounter for antineoplastic chemotherapy 12/19/2015  . Meralgia paresthetica of left side 07/10/2015  . Osteoporosis 07/10/2015  . Onychomycosis 07/10/2015  . Anxiety 07/10/2015  . Diabetes ([place]) 07/10/2015  . Cough 12/12/2013  . Pneumothorax after biopsy 07/09/2011    Class: Acute  . Primary adenocarcinoma of lower lobe of right lung ([place]) 06/24/2011  . Hypercholesterolemia 10/20/2010  . Essential hypertension, benign 10/20/2010  . Osteoarthritis 10/20/2010     Lanney Gins, PT, DPT 12/30/16 4:15 PM   [facility] [address]  [place], [place], [address] Phone: [phone]   Fax:  [phone]  Name: Samantha Quinn MRN: [id number] Date of Birth: [birth year]

## 2016-12-31 ENCOUNTER — Ambulatory Visit: Payer: Medicare Other | Admitting: Physical Therapy

## 2017-01-04 DIAGNOSIS — M5412 Radiculopathy, cervical region: Secondary | ICD-10-CM | POA: Diagnosis not present

## 2017-01-05 ENCOUNTER — Ambulatory Visit: Payer: Medicare Other | Admitting: Physical Therapy

## 2017-01-05 DIAGNOSIS — R262 Difficulty in walking, not elsewhere classified: Secondary | ICD-10-CM

## 2017-01-05 DIAGNOSIS — M542 Cervicalgia: Secondary | ICD-10-CM

## 2017-01-05 DIAGNOSIS — M6281 Muscle weakness (generalized): Secondary | ICD-10-CM | POA: Diagnosis not present

## 2017-01-05 DIAGNOSIS — R293 Abnormal posture: Secondary | ICD-10-CM

## 2017-01-05 DIAGNOSIS — M25512 Pain in left shoulder: Secondary | ICD-10-CM

## 2017-01-05 DIAGNOSIS — M545 Low back pain: Principal | ICD-10-CM

## 2017-01-05 DIAGNOSIS — G8929 Other chronic pain: Secondary | ICD-10-CM

## 2017-01-05 NOTE — Patient Instructions (Signed)
Neurovascular: Median Nerve Glide With Cervical Bias - Supine    Lie with neck supported, right arm out to side, elbow straight, thumb down, fingers and wrist bent back. Slowly move opposite side ear toward shoulder as far as possible without pain. Repeat _10___ times per set.    Neurovascular: Median Nerve Glide With Elbow Bias - Supine    Lie with neck supported. Hold right arm out to side, elbow bent, thumb down, fingers and wrist bent back. Slowly straighten elbow as far as possible without pain. Repeat _10___ times per set. [name]  Axial Extension (Chin Tuck)    Pull chin in and lengthen back of neck. Hold __5-10__ seconds while counting out loud. Repeat __15__ times. Do __2__ sessions per day. **can do lying down or seated**  Scapular Retraction (Standing)   With arms at sides, pinch shoulder blades together. Repeat __15__ times per set. Do _2___ sets per session.

## 2017-01-06 NOTE — Therapy (Signed)
[facility] [address]  [place], [place], [address] Phone: [phone]   Fax:  [phone]  Physical Therapy Treatment  Patient Details  Name: Samantha Quinn MRN: [id number] Date of Birth: [birth year] Referring Provider: Dr. Melrose Nakayama  Encounter Date: 01/05/2017      PT End of Session - 01/06/17 0951    Visit Number 7   Number of Visits 12   Date for PT Re-Evaluation 01/18/17   Authorization Type Medicare   PT Start Time 1102   PT Stop Time 1148   PT Time Calculation (min) 46 min   Activity Tolerance Patient tolerated treatment well   Behavior During Therapy [facility] for tasks assessed/performed      Past Medical History:  Diagnosis Date  . Back pain    bulding disc  . Bronchitis    hx of-many,many years   . Diabetes mellitus   . Diabetes mellitus    type 2 and takes Metformin bid  . Dyslipidemia   . Embolism - blood clot    hx of in left lower leg and was on Coumadin for about 36months;this was about 8-36yrs ago  . Family history of adverse reaction to anesthesia    Daughter- Nausea  . History of colonic polyps   . HOH (hard of hearing)   . Hx of migraines    as a teenager  . Hypercholesterolemia   . Hyperlipidemia    takes Lovastatin every other day  . Hypertension    takes Ziac daily and Ramipril as well  . Hypothyroidism    takes Synthroid every other day  . Insomnia    takes Elavil nightly  . Joint pain   . Lung mass   . Myalgia   . Osteoarthritis   . Osteoporosis   . Pneumothorax of right lung after biopsy 07/09/11   HAD CHEST TUBE PLACEMENT  . PONV (postoperative nausea and vomiting)   . Primary adenocarcinoma of lower lobe of right lung ([place]) 06/24/2011   pT2a, pN0 M0 Stage 1 Well differenced Adenocarcinoma 3.5 cm resected 07/17/2011  . Primary adenocarcinoma of lung ([place]) 06/24/2011  . Shortness of breath dyspnea    With exertion  . Skin cancer    legs have dark spots on them  .  Upper respiratory infection 04/2011  . Urinary incontinence    takes Detrol daily prn    Past Surgical History:  Procedure Laterality Date  . ABDOMINAL HYSTERECTOMY    . APPENDECTOMY     as a child  . CHEST TUBE REMOVAL  07/13/11   RIGHT  CHEST TUBE  . CHOLECYSTECTOMY    . COLONOSCOPY    . COLONOSCOPY    . DILATION AND CURETTAGE OF UTERUS     x 2  . EYE SURGERY Bilateral    Cataract  . HEMORRHOID SURGERY    . LUNG BIOPSY  07/09/11   RIGHT UPER LOBE LUNG MASS   . RIGHT CHEST TUBE PLACEMENT  07/10/11   S/P LUNG BX  . ROTATOR CUFF REPAIR     bilateral  . TOE SURGERY  2013   right small toe  . TONSILLECTOMY     as a child  . VIDEO BRONCHOSCOPY  07/17/2011   Procedure: VIDEO BRONCHOSCOPY;  Surgeon: Grace Isaac, MD;  Location: [facility];  Service: Thoracic;  Laterality: N/A;  . VIDEO BRONCHOSCOPY WITH ENDOBRONCHIAL ULTRASOUND N/A 12/10/2015   Procedure: VIDEO BRONCHOSCOPY WITH ENDOBRONCHIAL ULTRASOUND with biopsies.;  Surgeon: Percell Miller  Maryruth Bun, MD;  Location: [facility];  Service: Thoracic;  Laterality: N/A;    There were no vitals filed for this visit.      Subjective Assessment - 01/06/17 0750    Subjective Patient with referral fro ortho MD for neck/shoulder pain   Pertinent History lung cancer, DM 2, HTN, osteoporosis   Diagnostic tests Xray and MRI: "ruptured disc" per patient   Patient Stated Goals improve pain and function   Currently in Pain? Yes   Pain Score 5    Pain Location Shoulder   Pain Orientation Left   Pain Descriptors / Indicators Aching;Discomfort   Pain Type Acute pain   Pain Radiating Towards intermittent N&T into L hand            [facility] PT Assessment - 01/06/17 0001      Assessment   Medical Diagnosis Cervical DDD/DJD and L arm radiculopathy   Referring Provider Dr. Melrose Nakayama     Posture/Postural Control   Posture/Postural Control Postural limitations   Postural Limitations Rounded Shoulders;Forward head;Increased thoracic kyphosis      ROM / Strength   AROM / PROM / Strength AROM     AROM   AROM Assessment Site Cervical   Cervical Flexion WFL - no pain   Cervical Extension Slightly limited - pain reprodduction at shoulder   Cervical - Right Side Bend 50% limited   Cervical - Left Side Bend 50% limited - pain reproduction, N&T into hand   Cervical - Right Rotation 25% limited   Cervical - Left Rotation 50% limited - pain reproduction, N&T into hand     Palpation   Spinal mobility tenderness/pain provocation with gentle CPA's of lower C-Spine   Palpation comment TTP over B UT, B cervical paraspinals     Special Tests    Special Tests Cervical   Cervical Tests Dictraction     Distraction Test   Findngs Positive   Comment reduction in symptoms                     [facility] Adult PT Treatment/Exercise - 01/06/17 0001      Exercises   Exercises Neck     Neck Exercises: Seated   Neck Retraction 10 reps;5 secs   Other Seated Exercise scap retraction 10 x 5 sec     Neck Exercises: Supine   Other Supine Exercise median nerve glide - both cervical and elbow bias x 10   Other Supine Exercise discussion on postural awareness and desk ergonomics at home                  PT Short Term Goals - 12/10/16 0762      PT SHORT TERM GOAL #1   Title patient to be independent with initial HEP for lumbar AROM and stretching    Status On-going           PT Long Term Goals - 01/06/17 2633      PT LONG TERM GOAL #1   Title patient to be independent with advanced HEP    Status On-going     PT LONG TERM GOAL #2   Title patient to improve lumbar AROM to [facility] in all planes without pain limiting   Status On-going     PT LONG TERM GOAL #3   Title patient to report ability to ambulate for >/= 30 min for cardiovascualr benefit without pain limiting   Status On-going     PT LONG TERM GOAL #  4   Title patient to improve L LE strength to >/= 4+/5   Status On-going     PT LONG TERM GOAL #5   Title  patient to report ability to perform household chores and ADLs without pain limiting   Status On-going     Additional Long Term Goals   Additional Long Term Goals Yes     PT LONG TERM GOAL #6   Title patient to demonstrate cervical AROM WFL and symmetrical with no pain provocation/reproduction   Status New     PT LONG TERM GOAL #7   Title patient to report shoulder/arm pain no greater than 2/10 for greater than 2 weeks   Status New               Plan - 01/06/17 3704    Clinical Impression Statement Patient recently seen by ortho MD for L shoulder pain with no known mechanism of injury. Patient reporting MD feels as if symptoms are contributed from a cervical spine pathology. Assessment today of cervical spine and shoulder: shoulder AROM limited, however, patient reports this is long standing from prior RTC repair, cervical spine AROM limited with pain provocation at L shoulder with cervical extension and both L lateral flexion and rotation. Symptoms slightly resolved with distraction. Will plan to include care for this area on current POC.    PT Treatment/Interventions ADLs/Self Care Home Management;Cryotherapy;Electrical Stimulation;Iontophoresis 4mg /ml Dexamethasone;Moist Heat;Ultrasound;Neuromuscular re-education;Balance training;Therapeutic exercise;Therapeutic activities;Functional mobility training;Stair training;Gait training;Patient/family education;Manual techniques;Passive range of motion;Vasopneumatic Device;Taping;Dry needling   Consulted and Agree with Plan of Care Patient      Patient will benefit from skilled therapeutic intervention in order to improve the following deficits and impairments:  Decreased activity tolerance, Decreased range of motion, Decreased mobility, Decreased strength, Difficulty walking, Pain, Impaired UE functional use, Postural dysfunction  Visit Diagnosis: Chronic left-sided low back pain, with sciatica presence unspecified  Abnormal  posture  Muscle weakness (generalized)  Difficulty in walking, not elsewhere classified  Cervicalgia  Acute pain of left shoulder     Problem List Patient Active Problem List   Diagnosis Date Noted  . Bilateral hearing loss 08/27/2016  . Nasal fracture 08/27/2016  . Knee injury 08/27/2016  . Head injury 08/27/2016  . Toxic multinodular goiter w/o crisis 06/22/2016  . Fatigue 05/05/2016  . Macular degeneration 03/09/2016  . Encounter for antineoplastic chemotherapy 12/19/2015  . Meralgia paresthetica of left side 07/10/2015  . Osteoporosis 07/10/2015  . Onychomycosis 07/10/2015  . Anxiety 07/10/2015  . Diabetes ([place]) 07/10/2015  . Cough 12/12/2013  . Pneumothorax after biopsy 07/09/2011    Class: Acute  . Primary adenocarcinoma of lower lobe of right lung ([place]) 06/24/2011  . Hypercholesterolemia 10/20/2010  . Essential hypertension, benign 10/20/2010  . Osteoarthritis 10/20/2010     Lanney Gins, PT, DPT 01/06/17 10:00 AM   [facility] [address]  [place], [place], [address] Phone: [phone]   Fax:  [phone]  Name: Samantha Quinn MRN: [id number] Date of Birth: 1927/11/04

## 2017-01-07 ENCOUNTER — Ambulatory Visit: Payer: Medicare Other | Admitting: Physical Therapy

## 2017-01-07 DIAGNOSIS — R293 Abnormal posture: Secondary | ICD-10-CM | POA: Diagnosis not present

## 2017-01-07 DIAGNOSIS — M545 Low back pain: Secondary | ICD-10-CM | POA: Diagnosis not present

## 2017-01-07 DIAGNOSIS — R262 Difficulty in walking, not elsewhere classified: Secondary | ICD-10-CM | POA: Diagnosis not present

## 2017-01-07 DIAGNOSIS — M6281 Muscle weakness (generalized): Secondary | ICD-10-CM

## 2017-01-07 DIAGNOSIS — M542 Cervicalgia: Secondary | ICD-10-CM | POA: Diagnosis not present

## 2017-01-07 DIAGNOSIS — G8929 Other chronic pain: Secondary | ICD-10-CM

## 2017-01-07 DIAGNOSIS — M25512 Pain in left shoulder: Secondary | ICD-10-CM

## 2017-01-07 NOTE — Therapy (Signed)
[facility] [address]  [place], [place], [address] Phone: [phone]   Fax:  [phone]  Physical Therapy Treatment  Patient Details  Name: Samantha Quinn MRN: [id number] Date of Birth: 1927-07-12 Referring Provider: Dr. Melrose Nakayama  Encounter Date: 01/07/2017      PT End of Session - 01/07/17 1311    Visit Number 8   Number of Visits 12   Date for PT Re-Evaluation 01/18/17   Authorization Type Medicare   PT Start Time 1310   PT Stop Time 1400   PT Time Calculation (min) 50 min   Activity Tolerance Patient tolerated treatment well   Behavior During Therapy [facility] for tasks assessed/performed      Past Medical History:  Diagnosis Date  . Back pain    bulding disc  . Bronchitis    hx of-many,many years   . Diabetes mellitus   . Diabetes mellitus    type 2 and takes Metformin bid  . Dyslipidemia   . Embolism - blood clot    hx of in left lower leg and was on Coumadin for about 22months;this was about 8-49yrs ago  . Family history of adverse reaction to anesthesia    Daughter- Nausea  . History of colonic polyps   . HOH (hard of hearing)   . Hx of migraines    as a teenager  . Hypercholesterolemia   . Hyperlipidemia    takes Lovastatin every other day  . Hypertension    takes Ziac daily and Ramipril as well  . Hypothyroidism    takes Synthroid every other day  . Insomnia    takes Elavil nightly  . Joint pain   . Lung mass   . Myalgia   . Osteoarthritis   . Osteoporosis   . Pneumothorax of right lung after biopsy 07/09/11   HAD CHEST TUBE PLACEMENT  . PONV (postoperative nausea and vomiting)   . Primary adenocarcinoma of lower lobe of right lung ([place]) 06/24/2011   pT2a, pN0 M0 Stage 1 Well differenced Adenocarcinoma 3.5 cm resected 07/17/2011  . Primary adenocarcinoma of lung ([place]) 06/24/2011  . Shortness of breath dyspnea    With exertion  . Skin cancer    legs have dark spots on them  .  Upper respiratory infection 04/2011  . Urinary incontinence    takes Detrol daily prn    Past Surgical History:  Procedure Laterality Date  . ABDOMINAL HYSTERECTOMY    . APPENDECTOMY     as a child  . CHEST TUBE REMOVAL  07/13/11   RIGHT  CHEST TUBE  . CHOLECYSTECTOMY    . COLONOSCOPY    . COLONOSCOPY    . DILATION AND CURETTAGE OF UTERUS     x 2  . EYE SURGERY Bilateral    Cataract  . HEMORRHOID SURGERY    . LUNG BIOPSY  07/09/11   RIGHT UPER LOBE LUNG MASS   . RIGHT CHEST TUBE PLACEMENT  07/10/11   S/P LUNG BX  . ROTATOR CUFF REPAIR     bilateral  . TOE SURGERY  2013   right small toe  . TONSILLECTOMY     as a child  . VIDEO BRONCHOSCOPY  07/17/2011   Procedure: VIDEO BRONCHOSCOPY;  Surgeon: Grace Isaac, MD;  Location: [facility];  Service: Thoracic;  Laterality: N/A;  . VIDEO BRONCHOSCOPY WITH ENDOBRONCHIAL ULTRASOUND N/A 12/10/2015   Procedure: VIDEO BRONCHOSCOPY WITH ENDOBRONCHIAL ULTRASOUND with biopsies.;  Surgeon: Percell Miller  Maryruth Bun, MD;  Location: [facility];  Service: Thoracic;  Laterality: N/A;    There were no vitals filed for this visit.      Subjective Assessment - 01/07/17 1310    Subjective felt well after last session - had some pain this morning after sleeping on shoulder all day   Pertinent History lung cancer, DM 2, HTN, osteoporosis   Diagnostic tests Xray and MRI: "ruptured disc" per patient   Patient Stated Goals improve pain and function   Currently in Pain? Yes   Pain Score 7    Pain Location Shoulder   Pain Orientation Left   Pain Descriptors / Indicators Aching;Discomfort   Pain Type Acute pain                         [facility] Adult PT Treatment/Exercise - 01/07/17 0001      Neck Exercises: Seated   Neck Retraction 10 reps;5 secs   Other Seated Exercise scap retraction 10 x 5 sec   Other Seated Exercise median nerve glide - cervical and elbow bias x 5 each     Knee/Hip Exercises: Aerobic   Nustep L5 x 6 min (BUE/R UE)      Manual Therapy   Manual Therapy Soft tissue mobilization;Joint mobilization;Manual Traction   Manual therapy comments self massage with ball   Joint Mobilization gentle (Grade I and II) CPAs of cervical spine   Soft tissue mobilization STM to B cervical paraspinals, B UT, B suboccipital   Manual Traction manual traction 5 x 30-45 seconds                  PT Short Term Goals - 12/10/16 4034      PT SHORT TERM GOAL #1   Title patient to be independent with initial HEP for lumbar AROM and stretching    Status On-going           PT Long Term Goals - 01/06/17 7425      PT LONG TERM GOAL #1   Title patient to be independent with advanced HEP    Status On-going     PT LONG TERM GOAL #2   Title patient to improve lumbar AROM to [facility] in all planes without pain limiting   Status On-going     PT LONG TERM GOAL #3   Title patient to report ability to ambulate for >/= 30 min for cardiovascualr benefit without pain limiting   Status On-going     PT LONG TERM GOAL #4   Title patient to improve L LE strength to >/= 4+/5   Status On-going     PT LONG TERM GOAL #5   Title patient to report ability to perform household chores and ADLs without pain limiting   Status On-going     Additional Long Term Goals   Additional Long Term Goals Yes     PT LONG TERM GOAL #6   Title patient to demonstrate cervical AROM WFL and symmetrical with no pain provocation/reproduction   Status New     PT LONG TERM GOAL #7   Title patient to report shoulder/arm pain no greater than 2/10 for greater than 2 weeks   Status New               Plan - 01/07/17 1313    Clinical Impression Statement Patient today with reports of feeling well since last session, however, did have an increase in pain after sleeping on shoulder  last night. patient today with improved symtpoms with manual traction. Patient likely to also have some trigger points in infraspinatus contributing to radicular symptoms as  patient notes in increase in symptoms following self massage with ball. Discussion of trial of DN at next visit.    PT Treatment/Interventions ADLs/Self Care Home Management;Cryotherapy;Electrical Stimulation;Iontophoresis 4mg /ml Dexamethasone;Moist Heat;Ultrasound;Neuromuscular re-education;Balance training;Therapeutic exercise;Therapeutic activities;Functional mobility training;Stair training;Gait training;Patient/family education;Manual techniques;Passive range of motion;Vasopneumatic Device;Taping;Dry needling   Consulted and Agree with Plan of Care Patient      Patient will benefit from skilled therapeutic intervention in order to improve the following deficits and impairments:  Decreased activity tolerance, Decreased range of motion, Decreased mobility, Decreased strength, Difficulty walking, Pain, Impaired UE functional use, Postural dysfunction  Visit Diagnosis: Chronic left-sided low back pain, with sciatica presence unspecified  Abnormal posture  Muscle weakness (generalized)  Difficulty in walking, not elsewhere classified  Cervicalgia  Acute pain of left shoulder     Problem List Patient Active Problem List   Diagnosis Date Noted  . Bilateral hearing loss 08/27/2016  . Nasal fracture 08/27/2016  . Knee injury 08/27/2016  . Head injury 08/27/2016  . Toxic multinodular goiter w/o crisis 06/22/2016  . Fatigue 05/05/2016  . Macular degeneration 03/09/2016  . Encounter for antineoplastic chemotherapy 12/19/2015  . Meralgia paresthetica of left side 07/10/2015  . Osteoporosis 07/10/2015  . Onychomycosis 07/10/2015  . Anxiety 07/10/2015  . Diabetes ([place]) 07/10/2015  . Cough 12/12/2013  . Pneumothorax after biopsy 07/09/2011    Class: Acute  . Primary adenocarcinoma of lower lobe of right lung ([place]) 06/24/2011  . Hypercholesterolemia 10/20/2010  . Essential hypertension, benign 10/20/2010  . Osteoarthritis 10/20/2010    Lanney Gins, PT, DPT 01/07/17 2:26  PM   [facility] [address]  [place], [place], [address] Phone: [phone]   Fax:  [phone]  Name: Samantha Quinn MRN: [id number] Date of Birth: 12/09/1927

## 2017-01-07 NOTE — Patient Instructions (Signed)

## 2017-01-12 ENCOUNTER — Ambulatory Visit: Payer: Medicare Other | Admitting: Physical Therapy

## 2017-01-12 DIAGNOSIS — R293 Abnormal posture: Secondary | ICD-10-CM | POA: Diagnosis not present

## 2017-01-12 DIAGNOSIS — M542 Cervicalgia: Secondary | ICD-10-CM | POA: Diagnosis not present

## 2017-01-12 DIAGNOSIS — G8929 Other chronic pain: Secondary | ICD-10-CM

## 2017-01-12 DIAGNOSIS — R262 Difficulty in walking, not elsewhere classified: Secondary | ICD-10-CM

## 2017-01-12 DIAGNOSIS — M6281 Muscle weakness (generalized): Secondary | ICD-10-CM

## 2017-01-12 DIAGNOSIS — M545 Low back pain: Secondary | ICD-10-CM | POA: Diagnosis not present

## 2017-01-12 DIAGNOSIS — M25512 Pain in left shoulder: Secondary | ICD-10-CM

## 2017-01-12 NOTE — Therapy (Signed)
[facility] [address]  [place], [place], [address] Phone: [phone]   Fax:  [phone]  Physical Therapy Treatment  Patient Details  Name: Samantha Quinn MRN: [id number] Date of Birth: 1927/12/25 Referring Provider: Dr. Melrose Nakayama  Encounter Date: 01/12/2017      PT End of Session - 01/12/17 1308    Visit Number 9   Number of Visits 12   Date for PT Re-Evaluation 01/18/17   Authorization Type Medicare   PT Start Time 6269   PT Stop Time 1110  moist heat   PT Time Calculation (min) 55 min   Activity Tolerance Patient tolerated treatment well   Behavior During Therapy [facility] for tasks assessed/performed      Past Medical History:  Diagnosis Date  . Back pain    bulding disc  . Bronchitis    hx of-many,many years   . Diabetes mellitus   . Diabetes mellitus    type 2 and takes Metformin bid  . Dyslipidemia   . Embolism - blood clot    hx of in left lower leg and was on Coumadin for about 5months;this was about 8-74yrs ago  . Family history of adverse reaction to anesthesia    Daughter- Nausea  . History of colonic polyps   . HOH (hard of hearing)   . Hx of migraines    as a teenager  . Hypercholesterolemia   . Hyperlipidemia    takes Lovastatin every other day  . Hypertension    takes Ziac daily and Ramipril as well  . Hypothyroidism    takes Synthroid every other day  . Insomnia    takes Elavil nightly  . Joint pain   . Lung mass   . Myalgia   . Osteoarthritis   . Osteoporosis   . Pneumothorax of right lung after biopsy 07/09/11   HAD CHEST TUBE PLACEMENT  . PONV (postoperative nausea and vomiting)   . Primary adenocarcinoma of lower lobe of right lung ([place]) 06/24/2011   pT2a, pN0 M0 Stage 1 Well differenced Adenocarcinoma 3.5 cm resected 07/17/2011  . Primary adenocarcinoma of lung ([place]) 06/24/2011  . Shortness of breath dyspnea    With exertion  . Skin cancer    legs have dark spots  on them  . Upper respiratory infection 04/2011  . Urinary incontinence    takes Detrol daily prn    Past Surgical History:  Procedure Laterality Date  . ABDOMINAL HYSTERECTOMY    . APPENDECTOMY     as a child  . CHEST TUBE REMOVAL  07/13/11   RIGHT  CHEST TUBE  . CHOLECYSTECTOMY    . COLONOSCOPY    . COLONOSCOPY    . DILATION AND CURETTAGE OF UTERUS     x 2  . EYE SURGERY Bilateral    Cataract  . HEMORRHOID SURGERY    . LUNG BIOPSY  07/09/11   RIGHT UPER LOBE LUNG MASS   . RIGHT CHEST TUBE PLACEMENT  07/10/11   S/P LUNG BX  . ROTATOR CUFF REPAIR     bilateral  . TOE SURGERY  2013   right small toe  . TONSILLECTOMY     as a child  . VIDEO BRONCHOSCOPY  07/17/2011   Procedure: VIDEO BRONCHOSCOPY;  Surgeon: Grace Isaac, MD;  Location: [place];  Service: Thoracic;  Laterality: N/A;  . VIDEO BRONCHOSCOPY WITH ENDOBRONCHIAL ULTRASOUND N/A 12/10/2015   Procedure: VIDEO BRONCHOSCOPY WITH ENDOBRONCHIAL ULTRASOUND with biopsies.;  Surgeon: Grace Isaac, MD;  Location: [facility] OR;  Service: Thoracic;  Laterality: N/A;    There were no vitals filed for this visit.      Subjective Assessment - 01/12/17 1019    Subjective continued pain - has been taking Advil   Pertinent History lung cancer, DM 2, HTN, osteoporosis   Patient Stated Goals improve pain and function   Currently in Pain? Yes   Pain Score 8    Pain Location Shoulder   Pain Orientation Left   Pain Descriptors / Indicators Aching;Sore   Pain Type Acute pain                         [facility] Adult PT Treatment/Exercise - 01/12/17 0001      Neck Exercises: Theraband   Rows 15 reps;Red   Other Theraband Exercises L shoulder flexion and abduction to [place] with yellow tband x 10     Lumbar Exercises: Standing   Other Standing Lumbar Exercises B hip extension with red tband x 12 each     Knee/Hip Exercises: Aerobic   Nustep L5 x 6 min (BLE/R UE)     Moist Heat Therapy   Number Minutes Moist Heat  10 Minutes   Moist Heat Location Shoulder     Manual Therapy   Manual Therapy Soft tissue mobilization   Manual therapy comments patient prone and seated   Soft tissue mobilization STM to L infraspinatus, L thoracic paraspinals, L rhomboids          Trigger Point Dry Needling - 01/12/17 1307    Consent Given? Yes   Education Handout Provided Yes   Muscles Treated Upper Body Infraspinatus   Infraspinatus Response Twitch response elicited;Palpable increased muscle length                PT Short Term Goals - 12/10/16 0835      PT SHORT TERM GOAL #1   Title patient to be independent with initial HEP for lumbar AROM and stretching    Status On-going           PT Long Term Goals - 01/06/17 0814      PT LONG TERM GOAL #1   Title patient to be independent with advanced HEP    Status On-going     PT LONG TERM GOAL #2   Title patient to improve lumbar AROM to [facility] in all planes without pain limiting   Status On-going     PT LONG TERM GOAL #3   Title patient to report ability to ambulate for >/= 30 min for cardiovascualr benefit without pain limiting   Status On-going     PT LONG TERM GOAL #4   Title patient to improve L LE strength to >/= 4+/5   Status On-going     PT LONG TERM GOAL #5   Title patient to report ability to perform household chores and ADLs without pain limiting   Status On-going     Additional Long Term Goals   Additional Long Term Goals Yes     PT LONG TERM GOAL #6   Title patient to demonstrate cervical AROM WFL and symmetrical with no pain provocation/reproduction   Status New     PT LONG TERM GOAL #7   Title patient to report shoulder/arm pain no greater than 2/10 for greater than 2 weeks   Status New               Plan -  01/12/17 1308    Clinical Impression Statement Patient continues to have L shoulder pain, which is her greatest concern. Did trial DN treatment today as PT and patient has been able to find trigger points in  infraspinatus region that mimick radicualr symptoms in L UE. Mild response from DN treatment, will need to assess full extent of beneift of DN at next visit.    PT Treatment/Interventions ADLs/Self Care Home Management;Cryotherapy;Electrical Stimulation;Iontophoresis 4mg /ml Dexamethasone;Moist Heat;Ultrasound;Neuromuscular re-education;Balance training;Therapeutic exercise;Therapeutic activities;Functional mobility training;Stair training;Gait training;Patient/family education;Manual techniques;Passive range of motion;Vasopneumatic Device;Taping;Dry needling   Consulted and Agree with Plan of Care Patient      Patient will benefit from skilled therapeutic intervention in order to improve the following deficits and impairments:  Decreased activity tolerance, Decreased range of motion, Decreased mobility, Decreased strength, Difficulty walking, Pain, Impaired UE functional use, Postural dysfunction  Visit Diagnosis: Chronic left-sided low back pain, with sciatica presence unspecified  Abnormal posture  Muscle weakness (generalized)  Difficulty in walking, not elsewhere classified  Cervicalgia  Acute pain of left shoulder     Problem List Patient Active Problem List   Diagnosis Date Noted  . Bilateral hearing loss 08/27/2016  . Nasal fracture 08/27/2016  . Knee injury 08/27/2016  . Head injury 08/27/2016  . Toxic multinodular goiter w/o crisis 06/22/2016  . Fatigue 05/05/2016  . Macular degeneration 03/09/2016  . Encounter for antineoplastic chemotherapy 12/19/2015  . Meralgia paresthetica of left side 07/10/2015  . Osteoporosis 07/10/2015  . Onychomycosis 07/10/2015  . Anxiety 07/10/2015  . Diabetes ([place]) 07/10/2015  . Cough 12/12/2013  . Pneumothorax after biopsy 07/09/2011    Class: Acute  . Primary adenocarcinoma of lower lobe of right lung ([place]) 06/24/2011  . Hypercholesterolemia 10/20/2010  . Essential hypertension, benign 10/20/2010  . Osteoarthritis 10/20/2010     Lanney Gins, PT, DPT 01/12/17 1:12 PM   [facility] [address]  [place], [place], [address] Phone: [phone]   Fax:  [phone]  Name: Samantha Quinn MRN: [id number] Date of Birth: 10-11-1927

## 2017-01-13 ENCOUNTER — Other Ambulatory Visit: Payer: Medicare Other

## 2017-01-13 ENCOUNTER — Ambulatory Visit: Payer: Medicare Other

## 2017-01-13 ENCOUNTER — Ambulatory Visit: Payer: Medicare Other | Admitting: Hematology & Oncology

## 2017-01-14 ENCOUNTER — Ambulatory Visit: Payer: Medicare Other | Admitting: Physical Therapy

## 2017-01-14 DIAGNOSIS — G8929 Other chronic pain: Secondary | ICD-10-CM

## 2017-01-14 DIAGNOSIS — M542 Cervicalgia: Secondary | ICD-10-CM

## 2017-01-14 DIAGNOSIS — R262 Difficulty in walking, not elsewhere classified: Secondary | ICD-10-CM

## 2017-01-14 DIAGNOSIS — M6281 Muscle weakness (generalized): Secondary | ICD-10-CM

## 2017-01-14 DIAGNOSIS — M25512 Pain in left shoulder: Secondary | ICD-10-CM

## 2017-01-14 DIAGNOSIS — R293 Abnormal posture: Secondary | ICD-10-CM | POA: Diagnosis not present

## 2017-01-14 DIAGNOSIS — M545 Low back pain: Secondary | ICD-10-CM | POA: Diagnosis not present

## 2017-01-14 NOTE — Therapy (Signed)
[facility] [address]  [place], [place], [address] Phone: [phone]   Fax:  [phone]  Physical Therapy Treatment  Patient Details  Name: Samantha Quinn MRN: [id number] Date of Birth: 07/02/1927 Referring Provider: Dr. Melrose Nakayama  Encounter Date: 01/14/2017      PT End of Session - 01/14/17 1536    Visit Number 10   Number of Visits 20   Date for PT Re-Evaluation 02/25/17   Authorization Type Medicare   PT Start Time 1532   PT Stop Time 1625  moist heat   PT Time Calculation (min) 53 min   Activity Tolerance Patient tolerated treatment well   Behavior During Therapy [facility] for tasks assessed/performed      Past Medical History:  Diagnosis Date  . Back pain    bulding disc  . Bronchitis    hx of-many,many years   . Diabetes mellitus   . Diabetes mellitus    type 2 and takes Metformin bid  . Dyslipidemia   . Embolism - blood clot    hx of in left lower leg and was on Coumadin for about 34months;this was about 8-88yrs ago  . Family history of adverse reaction to anesthesia    Daughter- Nausea  . History of colonic polyps   . HOH (hard of hearing)   . Hx of migraines    as a teenager  . Hypercholesterolemia   . Hyperlipidemia    takes Lovastatin every other day  . Hypertension    takes Ziac daily and Ramipril as well  . Hypothyroidism    takes Synthroid every other day  . Insomnia    takes Elavil nightly  . Joint pain   . Lung mass   . Myalgia   . Osteoarthritis   . Osteoporosis   . Pneumothorax of right lung after biopsy 07/09/11   HAD CHEST TUBE PLACEMENT  . PONV (postoperative nausea and vomiting)   . Primary adenocarcinoma of lower lobe of right lung ([place]) 06/24/2011   pT2a, pN0 M0 Stage 1 Well differenced Adenocarcinoma 3.5 cm resected 07/17/2011  . Primary adenocarcinoma of lung ([place]) 06/24/2011  . Shortness of breath dyspnea    With exertion  . Skin cancer    legs have dark spots  on them  . Upper respiratory infection 04/2011  . Urinary incontinence    takes Detrol daily prn    Past Surgical History:  Procedure Laterality Date  . ABDOMINAL HYSTERECTOMY    . APPENDECTOMY     as a child  . CHEST TUBE REMOVAL  07/13/11   RIGHT  CHEST TUBE  . CHOLECYSTECTOMY    . COLONOSCOPY    . COLONOSCOPY    . DILATION AND CURETTAGE OF UTERUS     x 2  . EYE SURGERY Bilateral    Cataract  . HEMORRHOID SURGERY    . LUNG BIOPSY  07/09/11   RIGHT UPER LOBE LUNG MASS   . RIGHT CHEST TUBE PLACEMENT  07/10/11   S/P LUNG BX  . ROTATOR CUFF REPAIR     bilateral  . TOE SURGERY  2013   right small toe  . TONSILLECTOMY     as a child  . VIDEO BRONCHOSCOPY  07/17/2011   Procedure: VIDEO BRONCHOSCOPY;  Surgeon: Grace Isaac, MD;  Location: [place];  Service: Thoracic;  Laterality: N/A;  . VIDEO BRONCHOSCOPY WITH ENDOBRONCHIAL ULTRASOUND N/A 12/10/2015   Procedure: VIDEO BRONCHOSCOPY WITH ENDOBRONCHIAL ULTRASOUND with biopsies.;  Surgeon: Grace Isaac, MD;  Location: [facility] OR;  Service: Thoracic;  Laterality: N/A;    There were no vitals filed for this visit.      Subjective Assessment - 01/14/17 1534    Subjective initially felt better after DN, continued pain at L shoulder   Pertinent History lung cancer, DM 2, HTN, osteoporosis   Diagnostic tests Xray and MRI: "ruptured disc" per patient   Patient Stated Goals improve pain and function   Currently in Pain? Yes   Pain Score 5    Pain Location Shoulder   Pain Orientation Left   Pain Descriptors / Indicators Aching;Discomfort;Sore   Pain Type Acute pain            [facility] PT Assessment - 01/14/17 0001      Observation/Other Assessments   Focus on Therapeutic Outcomes (FOTO)  Lumbar: 47 (53% limited, predicted 45% limited)                     [facility] Adult PT Treatment/Exercise - 01/14/17 1536      Exercises   Exercises Neck     Neck Exercises: Theraband   Shoulder Extension 15 reps;Red   Rows 15  reps;Red   Shoulder Internal Rotation 15 reps;Red   Shoulder Internal Rotation Limitations L UE only     Knee/Hip Exercises: Aerobic   Nustep L5 x 6 min (BLE/R UE)     Moist Heat Therapy   Number Minutes Moist Heat 10 Minutes   Moist Heat Location Shoulder     Manual Therapy   Manual Therapy Soft tissue mobilization   Manual therapy comments patient prone and seated   Soft tissue mobilization STM to L infraspinatus, L thoracic paraspinals, L rhomboids, L UT          Trigger Point Dry Needling - 01/14/17 1801    Consent Given? Yes   Muscles Treated Upper Body Infraspinatus   Infraspinatus Response Twitch response elicited;Palpable increased muscle length                PT Short Term Goals - 01/14/17 1802      PT SHORT TERM GOAL #1   Title patient to be independent with initial HEP for lumbar AROM and stretching    Status Achieved           PT Long Term Goals - 01/14/17 1802      PT LONG TERM GOAL #1   Title patient to be independent with advanced HEP    Status On-going     PT LONG TERM GOAL #2   Title patient to improve lumbar AROM to [facility] in all planes without pain limiting   Status On-going     PT LONG TERM GOAL #3   Title patient to report ability to ambulate for >/= 30 min for cardiovascualr benefit without pain limiting   Status On-going     PT LONG TERM GOAL #4   Title patient to improve L LE strength to >/= 4+/5   Status On-going     PT LONG TERM GOAL #5   Title patient to report ability to perform household chores and ADLs without pain limiting   Status On-going     PT LONG TERM GOAL #6   Title patient to demonstrate cervical AROM WFL and symmetrical with no pain provocation/reproduction   Status On-going     PT LONG TERM GOAL #7   Title patient to report shoulder/arm pain no greater than 2/10 for greater  than 2 weeks   Status On-going               Plan - 2017-02-06 1536    Clinical Impression Statement Patient today reporting  general improvements in low back pain. L shoulder/neck pain continues ot be of greatest concern. Patient only tolerable to some ther ex. with L UE as this tends to exacerbate pain. All goals currently ongoing at this point in time. Patient noting a mild beneift from DN at last session with wishes to trial DN 1x more, with DN performed today with improved response as compared to last visit. Will plan to extend patients current POC at this time, as patient and PT both believe patient will continue to benefit from PT to improve functional mobility and QOL.    PT Treatment/Interventions ADLs/Self Care Home Management;Cryotherapy;Electrical Stimulation;Iontophoresis 4mg /ml Dexamethasone;Moist Heat;Ultrasound;Neuromuscular re-education;Balance training;Therapeutic exercise;Therapeutic activities;Functional mobility training;Stair training;Gait training;Patient/family education;Manual techniques;Passive range of motion;Vasopneumatic Device;Taping;Dry needling   Consulted and Agree with Plan of Care Patient      Patient will benefit from skilled therapeutic intervention in order to improve the following deficits and impairments:  Decreased activity tolerance, Decreased range of motion, Decreased mobility, Decreased strength, Difficulty walking, Pain, Impaired UE functional use, Postural dysfunction  Visit Diagnosis: Chronic left-sided low back pain, with sciatica presence unspecified - Plan: PT plan of care cert/re-cert  Abnormal posture - Plan: PT plan of care cert/re-cert  Muscle weakness (generalized) - Plan: PT plan of care cert/re-cert  Difficulty in walking, not elsewhere classified - Plan: PT plan of care cert/re-cert  Cervicalgia - Plan: PT plan of care cert/re-cert  Acute pain of left shoulder - Plan: PT plan of care cert/re-cert       G-Codes - 02/06/17 1804/07/28    Functional Assessment Tool Used (Outpatient Only) FOTO: 47 (53% limited, predicted 45% limited)   Functional Limitation Mobility:  Walking and moving around   Mobility: Walking and Moving Around Current Status ([id number]) At least 40 percent but less than 60 percent impaired, limited or restricted   Mobility: Walking and Moving Around Goal Status [phone]) At least 40 percent but less than 60 percent impaired, limited or restricted      Problem List Patient Active Problem List   Diagnosis Date Noted  . Bilateral hearing loss 08/27/2016  . Nasal fracture 08/27/2016  . Knee injury 08/27/2016  . Head injury 08/27/2016  . Toxic multinodular goiter w/o crisis 06/22/2016  . Fatigue 05/05/2016  . Macular degeneration 03/09/2016  . Encounter for antineoplastic chemotherapy 12/19/2015  . Meralgia paresthetica of left side 07/10/2015  . Osteoporosis 07/10/2015  . Onychomycosis 07/10/2015  . Anxiety 07/10/2015  . Diabetes ([place]) 07/10/2015  . Cough 12/12/2013  . Pneumothorax after biopsy 07/09/2011    Class: Acute  . Primary adenocarcinoma of lower lobe of right lung ([place]) 06/24/2011  . Hypercholesterolemia 10/20/2010  . Essential hypertension, benign 10/20/2010  . Osteoarthritis 10/20/2010     Lanney Gins, PT, DPT 2017-02-06 6:09 PM   [facility] [address]  [place], [place], [address] Phone: [phone]   Fax:  [phone]  Name: ELLORIE KINDALL MRN: [id number] Date of Birth: June 14, 1927

## 2017-01-18 ENCOUNTER — Encounter: Payer: Self-pay | Admitting: Internal Medicine

## 2017-01-18 ENCOUNTER — Ambulatory Visit (INDEPENDENT_AMBULATORY_CARE_PROVIDER_SITE_OTHER): Payer: Medicare Other | Admitting: Internal Medicine

## 2017-01-18 VITALS — BP 122/62 | HR 82 | Wt 129.0 lb

## 2017-01-18 DIAGNOSIS — E0520 Thyrotoxicosis with toxic multinodular goiter without thyrotoxic crisis or storm: Secondary | ICD-10-CM | POA: Diagnosis not present

## 2017-01-18 DIAGNOSIS — E0590 Thyrotoxicosis, unspecified without thyrotoxic crisis or storm: Secondary | ICD-10-CM | POA: Diagnosis not present

## 2017-01-18 LAB — T3, FREE: T3 FREE: 3.3 pg/mL (ref 2.3–4.2)

## 2017-01-18 LAB — TSH: TSH: 0.6 u[IU]/mL (ref 0.35–4.50)

## 2017-01-18 LAB — T4, FREE: FREE T4: 0.81 ng/dL (ref 0.60–1.60)

## 2017-01-18 NOTE — Patient Instructions (Signed)
Please continue Methimazole 2.5 mg daily (or 5 mg every other day.  Please stop at the lab.  Please come back for a follow-up appointment in 3 months.

## 2017-01-18 NOTE — Progress Notes (Signed)
Patient ID: Samantha Quinn, female   DOB: 08-10-1927, 83 y.o.   MRN: [id number]   HPI  Samantha Quinn is a 83 y.o.-year-old female, returning for follow-up for thyrotoxicosis. Last visit was 4 months ago.  Patient has a history of lung cancer and has had radiotherapy and immunotherapy. Latest restaging CT scan with contrast was on 10/14/2016: Response to therapy. Decrease in size of index central right lower lobe lung lesion and bilateral pulmonary nodules.  She will have another one in 2 days.  Reviewed and addended hx: She also has a history of thyroiditis with subsequent hypothyroidism and was on levothyroxine for many years. However, the TSH study to be suppressed and her levothyroxine dose continued to be decreased until this was stopped ~ 6 mo ago.  I reviewed pt's thyroid tests: Lab Results  Component Value Date   TSH 3.65 11/18/2016   TSH 0.56 09/16/2016   TSH 0.12 (L) 08/12/2016   TSH 0.02 (L) 06/17/2016   TSH 0.02 (L) 05/05/2016   TSH <0.080 (L) 01/27/2016   TSH 0.01 (L) 11/12/2015   TSH 0.01 (L) 07/10/2015   TSH <0.008 (L) 03/08/2015   TSH 0.02 (L) 09/25/2014   FREET4 0.53 (L) 11/18/2016   FREET4 0.55 (L) 09/16/2016   FREET4 0.46 (L) 08/12/2016   FREET4 0.81 06/17/2016   FREET4 1.09 05/05/2016   FREET4 1.32 11/12/2015   FREET4 1.47 07/10/2015   FREET4 1.60 03/08/2015   FREET4 1.42 08/18/2013   FREET4 1.25 09/29/2012    Lab Results  Component Value Date   TSI <89 06/17/2016   She has a history of thyroid nodules, with a thyroid ultrasound in 08/29/2013 and the subsequent biopsy of the dominant right mid nodule measuring 3.1 x 1.9 x 2.1 cm. This was benign.  Thyroid uptake and scan 08/28/2016: uptake was elevated, at 40%, and the scan was positive for multinodular goiter.  After the results returned, we discussed about starting methimazole low-dose until she finished her chemotherapy and had another restaging scan. She is now on 5 mg of methimazole every other  day.  She c/o: - feeling hot and increased sweating - L shoulder pain >> was on a prednisone taper - finished earlier this mo  Pt denies: - feeling nodules in neck - hoarseness - dysphagia - choking - SOB with lying down  Pt does have a FH of thyroid ds.: mother - thyroid removed; sister - ?. No h/o radiation tx to head or neck. Had RxTx  - lung. No FH of thyroid cancer. No seaweed or kelp. No recent contrast studies. No herbal supplements. No Biotin use. No recent steroids use.   ROS: Constitutional: no weight gain/no weight loss, no fatigue, + subjective hyperthermia (+ sweating), no subjective hypothermia Eyes: no blurry vision, no xerophthalmia ENT: no sore throat, no nodules palpated in throat, no dysphagia, no odynophagia, no hoarseness Cardiovascular: no CP/no SOB/no palpitations/no leg swelling Respiratory: no cough/no SOB/no wheezing Gastrointestinal: no N/no V/no D/no C/no acid reflux Musculoskeletal: no muscle aches/no joint aches Skin: no rashes, no hair loss Neurological: no tremors/no numbness/no tingling/no dizziness  I reviewed pt's medications, allergies, PMH, social hx, family hx, and changes were documented in the history of present illness. Otherwise, unchanged from my initial visit note.   Past Medical History:  Diagnosis Date  . Back pain    bulding disc  . Bronchitis    hx of-many,many years   . Diabetes mellitus   . Diabetes mellitus    type 2  and takes Metformin bid  . Dyslipidemia   . Embolism - blood clot    hx of in left lower leg and was on Coumadin for about 65month;this was about 8-185yrago  . Family history of adverse reaction to anesthesia    Daughter- Nausea  . History of colonic polyps   . HOH (hard of hearing)   . Hx of migraines    as a teenager  . Hypercholesterolemia   . Hyperlipidemia    takes Lovastatin every other day  . Hypertension    takes Ziac daily and Ramipril as well  . Hypothyroidism    takes Synthroid every  other day  . Insomnia    takes Elavil nightly  . Joint pain   . Lung mass   . Myalgia   . Osteoarthritis   . Osteoporosis   . Pneumothorax of right lung after biopsy 07/09/11   HAD CHEST TUBE PLACEMENT  . PONV (postoperative nausea and vomiting)   . Primary adenocarcinoma of lower lobe of right lung ([facility]/27/2013   pT2a, pN0 M0 Stage 1 Well differenced Adenocarcinoma 3.5 cm resected 07/17/2011  . [place] of lung [place]  . Shortness of breath dyspnea    With exertion  . Skin cancer    legs have dark spots on them  . Upper respiratory infection 04/2011  . Urinary incontinence    takes Detrol daily prn   Past Surgical History:  Procedure Laterality Date  . ABDOMINAL HYSTERECTOMY    . APPENDECTOMY     as a child  . CHEST TUBE REMOVAL  07/13/11   RIGHT  CHEST TUBE  . CHOLECYSTECTOMY    . COLONOSCOPY    . COLONOSCOPY    . DILATION AND CURETTAGE OF UTERUS     x 2  . EYE SURGERY Bilateral    Cataract  . HEMORRHOID SURGERY    . LUNG BIOPSY  07/09/11   RIGHT UPER LOBE LUNG MASS   . RIGHT CHEST TUBE PLACEMENT  07/10/11   S/P LUNG BX  . ROTATOR CUFF REPAIR     bilateral  . TOE SURGERY  2013   right small toe  . TONSILLECTOMY     as a child  . VIDEO BRONCHOSCOPY  07/17/2011   Procedure: VIDEO BRONCHOSCOPY;  Surgeon: EdGrace IsaacMD;  [facility]: [facility];  Laterality: N/A;  . VIDEO BRONCHOSCOPY WITH ENDOBRONCHIAL ULTRASOUND N/A 12/10/2015   Procedure: VIDEO BRONCHOSCOPY WITH ENDOBRONCHIAL ULTRASOUND with biopsies.;  Surgeon: EdGrace IsaacMD;  [facility] Service: Thoracic;  Laterality: N/A;   Social History   Social History  . Marital status: Widowed    Spouse name: N/A  . Number of children: 3   Occupational History  . homemaker   Social History Main Topics  . Smoking status: Never Smoker  . Smokeless tobacco: Never Used  . Alcohol use No  . Drug use: No   Current Outpatient Prescriptions on File Prior to Visit   Medication Sig Dispense Refill  . amitriptyline (ELAVIL) 10 MG tablet Take 1 tablet (10 mg total) by mouth at bedtime. (Patient taking differently: Take 5 mg by mouth at bedtime. ) 90 tablet 1  . bisoprolol-hydrochlorothiazide (ZIAC) 2.5-6.25 MG tablet TAKE 1 TABLET DAILY 90 tablet 3  . Blood Glucose Monitoring Suppl (ONE TOUCH ULTRA 2) w/Device KIT Use to check blood sugars twice a day Dx E11.9 1 each 0  . calcium carbonate (OS-CAL) 600 MG tablet Take 600 mg  by mouth daily.    [name] gabapentin (NEURONTIN) 100 MG capsule Take 100 mg by mouth 3 (three) times daily.     [name] glucose blood (ONE TOUCH ULTRA TEST) test strip 1 each by Other route 2 (two) times daily. Use to check blood sugars twice a day 100 each 3  . HYDROcodone-homatropine (HYCODAN) 5-1.5 MG/5ML syrup Take 5 mLs by mouth every 6 (six) hours as needed for cough. (Patient taking differently: Take 2.5 mLs by mouth every 6 (six) hours as needed for cough. ) 180 mL 0  . ibuprofen (ADVIL,MOTRIN) 200 MG tablet Take 200 mg by mouth every 6 (six) hours as needed for fever, headache or mild pain.     . Lancets (ONETOUCH ULTRASOFT) lancets 1 each by Other route 2 (two) times daily. Use to help check blood sugars twice a day 100 each 3  . lovastatin (MEVACOR) 20 MG tablet TAKE 1 TABLET BY MOUTH EVERY OTHER DAY 90 tablet 1  . metFORMIN (GLUCOPHAGE) 500 MG tablet TAKE 1 TABLET 2 TIMES DAILY 180 tablet 1  . methimazole (TAPAZOLE) 5 MG tablet Take 0.5 tablets (2.5 mg total) by mouth daily. (Patient taking differently: Take 2.5 mg by mouth. Take one every other day.) 30 tablet 3  . Multiple Vitamins-Minerals (VISION-VITE PRESERVE PO) Take by mouth 2 (two) times daily.     No current facility-administered medications on file prior to visit.    Allergies  Allergen Reactions  . Codeine Nausea And Vomiting  . Tramadol Other (See Comments)    syncope  . Demerol Nausea Only   Family History  Problem Relation Age of Onset  . Hypertension Mother   .  Hypertension Father   . Arthritis Sister   . Arthritis Sister   . Diabetes Brother   . Hypertension Sister   . Anesthesia problems Neg Hx   . Hypotension Neg Hx   . Malignant hyperthermia Neg Hx   . Pseudochol deficiency Neg Hx   . Heart attack Neg Hx    PE: BP 122/62 (BP Location: Left Arm, Patient Position: Sitting)   Pulse 82   Wt 129 lb (58.5 kg)   SpO2 98%   BMI 24.37 kg/m  Wt Readings from Last 3 Encounters:  01/18/17 129 lb (58.5 kg)  12/23/16 130 lb (59 kg)  12/10/16 128 lb (58.1 kg)   Constitutional: normal weight, in NAD Eyes: PERRLA, EOMI, no exophthalmos ENT: moist mucous membranes, no thyromegaly, but + R large mobile  thyroid nodule; no cervical lymphadenopathy Cardiovascular: RRR, No MRG Respiratory: CTA B Gastrointestinal: abdomen soft, NT, ND, BS+ Musculoskeletal: no deformities, strength intact in all 4 Skin: moist, warm, no rashes Neurological: no tremor with outstretched hands, DTR normal in all 4  ASSESSMENT: 1. Thyrotoxicosis - h/o hypothyroidism  2. TMNG  PLAN:  1. And 2. Patient with a long h/o low TSH, prev. On LT4, then persistent suppressed TSH despite stopping LT4. TSI Ab's were not elevated to suggest Graves ds. After last visit, we checked a thyroid Uptake and Scan >> toxic multinodular goiter. We started methimazole, initially at 5 mg daily, which we were able to decrease to 5 mg every other day 2 months ago. However, she developed heat intolerance and increased sweating on the new dose. We will check another set of TFTs today and adjust the dose of methimazole as needed - We again discussed about possible treatments for toxic multinodular goiter. She is currently treated with methimazole, which is perfectly adequate, however, we cannot stop it  since thyrotoxicosis will return. Permanent treatments are RAI treatment and surgery. We cannot proceed with RAI treatment for now since she has a CT scan with contrast coming up in 2 days for her lung  cancer. However, at next visit, in 3 months, we may discuss again about RAI treatment. I would like to avoid surgery for her due to age and comorbidities. - she agrees.  - continue Bisoprolol - check TFTs today: Orders Placed This Encounter  Procedures  . TSH  . T4, free  . T3, free  - RTC in 3 mo, but possibly sooner for labs  Component     Latest Ref Rng & Units 01/18/2017  TSH     0.35 - 4.50 uIU/mL 0.60  T4,Free(Direct)     0.60 - 1.60 ng/dL 0.81  Triiodothyronine,Free,Serum     2.3 - 4.2 pg/mL 3.3  Normal TFTs >> continue current MMI dose Philemon Kingdom, MD PhD [facility]

## 2017-01-19 ENCOUNTER — Ambulatory Visit: Payer: Medicare Other | Admitting: Physical Therapy

## 2017-01-19 DIAGNOSIS — R293 Abnormal posture: Secondary | ICD-10-CM

## 2017-01-19 DIAGNOSIS — M542 Cervicalgia: Secondary | ICD-10-CM

## 2017-01-19 DIAGNOSIS — M6281 Muscle weakness (generalized): Secondary | ICD-10-CM

## 2017-01-19 DIAGNOSIS — G8929 Other chronic pain: Secondary | ICD-10-CM

## 2017-01-19 DIAGNOSIS — M545 Low back pain: Principal | ICD-10-CM

## 2017-01-19 DIAGNOSIS — R262 Difficulty in walking, not elsewhere classified: Secondary | ICD-10-CM

## 2017-01-19 DIAGNOSIS — M25512 Pain in left shoulder: Secondary | ICD-10-CM

## 2017-01-19 NOTE — Therapy (Signed)
[facility] [address]  [place], [place], [address] Phone: [phone]   Fax:  [phone]  Physical Therapy Treatment  Patient Details  Name: Samantha Quinn MRN: [id number] Date of Birth: [birth year] Referring Provider: Dr. Melrose Nakayama  Encounter Date: 01/19/2017      PT End of Session - 01/19/17 1541    Visit Number 11   Number of Visits 20   Date for PT Re-Evaluation 02/25/17   Authorization Type Medicare   PT Start Time 1534   PT Stop Time 1624  moist heat   PT Time Calculation (min) 50 min   Activity Tolerance Patient tolerated treatment well   Behavior During Therapy [facility] for tasks assessed/performed      Past Medical History:  Diagnosis Date  . Back pain    bulding disc  . Bronchitis    hx of-many,many years   . Diabetes mellitus   . Diabetes mellitus    type 2 and takes Metformin bid  . Dyslipidemia   . Embolism - blood clot    hx of in left lower leg and was on Coumadin for about 71months;this was about 8-8yrs ago  . Family history of adverse reaction to anesthesia    Daughter- Nausea  . History of colonic polyps   . HOH (hard of hearing)   . Hx of migraines    as a teenager  . Hypercholesterolemia   . Hyperlipidemia    takes Lovastatin every other day  . Hypertension    takes Ziac daily and Ramipril as well  . Hypothyroidism    takes Synthroid every other day  . Insomnia    takes Elavil nightly  . Joint pain   . Lung mass   . Myalgia   . Osteoarthritis   . Osteoporosis   . Pneumothorax of right lung after biopsy 07/09/11   HAD CHEST TUBE PLACEMENT  . PONV (postoperative nausea and vomiting)   . Primary adenocarcinoma of lower lobe of right lung ([place]) 06/24/2011   pT2a, pN0 M0 Stage 1 Well differenced Adenocarcinoma 3.5 cm resected 07/17/2011  . Primary adenocarcinoma of lung ([place]) 06/24/2011  . Shortness of breath dyspnea    With exertion  . Skin cancer    legs have dark spots  on them  . Upper respiratory infection 04/2011  . Urinary incontinence    takes Detrol daily prn    Past Surgical History:  Procedure Laterality Date  . ABDOMINAL HYSTERECTOMY    . APPENDECTOMY     as a child  . CHEST TUBE REMOVAL  07/13/11   RIGHT  CHEST TUBE  . CHOLECYSTECTOMY    . COLONOSCOPY    . COLONOSCOPY    . DILATION AND CURETTAGE OF UTERUS     x 2  . EYE SURGERY Bilateral    Cataract  . HEMORRHOID SURGERY    . LUNG BIOPSY  07/09/11   RIGHT UPER LOBE LUNG MASS   . RIGHT CHEST TUBE PLACEMENT  07/10/11   S/P LUNG BX  . ROTATOR CUFF REPAIR     bilateral  . TOE SURGERY  2013   right small toe  . TONSILLECTOMY     as a child  . VIDEO BRONCHOSCOPY  07/17/2011   Procedure: VIDEO BRONCHOSCOPY;  Surgeon: Grace Isaac, MD;  Location: [place];  Service: Thoracic;  Laterality: N/A;  . VIDEO BRONCHOSCOPY WITH ENDOBRONCHIAL ULTRASOUND N/A 12/10/2015   Procedure: VIDEO BRONCHOSCOPY WITH ENDOBRONCHIAL ULTRASOUND with biopsies.;  Surgeon: Grace Isaac, MD;  Location: [facility] OR;  Service: Thoracic;  Laterality: N/A;    There were no vitals filed for this visit.      Subjective Assessment - 01/19/17 1537    Subjective had some slight pain relief today; doesn't know if DN helped or not   Pertinent History lung cancer, DM 2, HTN, osteoporosis   Diagnostic tests Xray and MRI: "ruptured disc" per patient   Patient Stated Goals improve pain and function   Currently in Pain? Yes   Pain Score 8    Pain Location Shoulder   Pain Orientation Left   Pain Descriptors / Indicators Aching;Sore;Discomfort   Pain Type Acute pain                         [facility] Adult PT Treatment/Exercise - 01/19/17 1540      Neck Exercises: Machines for Strengthening   UBE (Upper Arm Bike) L1 x 5 min - forward     Modalities   Modalities Moist Heat     Moist Heat Therapy   Number Minutes Moist Heat 10 Minutes   Moist Heat Location Shoulder     Manual Therapy   Manual Therapy Soft  tissue mobilization;Myofascial release;Manual Traction;Passive ROM   Manual therapy comments patient supine   Soft tissue mobilization STM to L UT, L LS, L cervical paraspinals, L posterior shoulder complex   Myofascial Release manual trigger point release to L UT, L infra/teres   Passive ROM manual L UT stretch 3 x 1 minute   Manual Traction manual traction 5 x 30-45 seconds                  PT Short Term Goals - 01/14/17 1802      PT SHORT TERM GOAL #1   Title patient to be independent with initial HEP for lumbar AROM and stretching    Status Achieved           PT Long Term Goals - 01/14/17 1802      PT LONG TERM GOAL #1   Title patient to be independent with advanced HEP    Status On-going     PT LONG TERM GOAL #2   Title patient to improve lumbar AROM to [facility] in all planes without pain limiting   Status On-going     PT LONG TERM GOAL #3   Title patient to report ability to ambulate for >/= 30 min for cardiovascualr benefit without pain limiting   Status On-going     PT LONG TERM GOAL #4   Title patient to improve L LE strength to >/= 4+/5   Status On-going     PT LONG TERM GOAL #5   Title patient to report ability to perform household chores and ADLs without pain limiting   Status On-going     PT LONG TERM GOAL #6   Title patient to demonstrate cervical AROM WFL and symmetrical with no pain provocation/reproduction   Status On-going     PT LONG TERM GOAL #7   Title patient to report shoulder/arm pain no greater than 2/10 for greater than 2 weeks   Status On-going               Plan - 01/19/17 1730    Clinical Impression Statement Patient unsure of true benefit from DN at last session. Did have some pain relief this morning at L shoulder, which patient notes first time in quite a  while, however, pain as since increased this afternoon. PT session focusing heavily on manual therapy due to high pain levels. I do believe, some pain is stemming from  patients cervical spine, however, I do believe L shoulder is also quite problematic as palpation/pressure can reproduce patients symptoms as well as general motion and RTC/periscapular strengthening reproducing same pain. Patient to see oncologist tomorrow with CT scan - did call ortho MD to see of L shoulder could also be added to this. WIll continue to progress per patient tolerance.    PT Treatment/Interventions ADLs/Self Care Home Management;Cryotherapy;Electrical Stimulation;Iontophoresis 4mg /ml Dexamethasone;Moist Heat;Ultrasound;Neuromuscular re-education;Balance training;Therapeutic exercise;Therapeutic activities;Functional mobility training;Stair training;Gait training;Patient/family education;Manual techniques;Passive range of motion;Vasopneumatic Device;Taping;Dry needling   Consulted and Agree with Plan of Care Patient      Patient will benefit from skilled therapeutic intervention in order to improve the following deficits and impairments:  Decreased activity tolerance, Decreased range of motion, Decreased mobility, Decreased strength, Difficulty walking, Pain, Impaired UE functional use, Postural dysfunction  Visit Diagnosis: Chronic left-sided low back pain, with sciatica presence unspecified  Abnormal posture  Muscle weakness (generalized)  Difficulty in walking, not elsewhere classified  Cervicalgia  Acute pain of left shoulder     Problem List Patient Active Problem List   Diagnosis Date Noted  . Thyrotoxicosis without thyroid storm 01/18/2017  . Bilateral hearing loss 08/27/2016  . Nasal fracture 08/27/2016  . Knee injury 08/27/2016  . Head injury 08/27/2016  . Toxic multinodular goiter w/o crisis 06/22/2016  . Fatigue 05/05/2016  . Macular degeneration 03/09/2016  . Encounter for antineoplastic chemotherapy 12/19/2015  . Meralgia paresthetica of left side 07/10/2015  . Osteoporosis 07/10/2015  . Onychomycosis 07/10/2015  . Anxiety 07/10/2015  . Diabetes  ([place]) 07/10/2015  . Cough 12/12/2013  . Pneumothorax after biopsy 07/09/2011    Class: Acute  . Primary adenocarcinoma of lower lobe of right lung ([place]) 06/24/2011  . Hypercholesterolemia 10/20/2010  . Essential hypertension, benign 10/20/2010  . Osteoarthritis 10/20/2010     Lanney Gins, PT, DPT 01/19/17 5:36 PM   [facility] [address]  [place], [place], [address] Phone: [phone]   Fax:  [phone]  Name: Samantha Quinn MRN: [id number] Date of Birth: [birth year]

## 2017-01-20 ENCOUNTER — Ambulatory Visit (HOSPITAL_BASED_OUTPATIENT_CLINIC_OR_DEPARTMENT_OTHER): Payer: Medicare Other | Admitting: Hematology & Oncology

## 2017-01-20 ENCOUNTER — Encounter (HOSPITAL_BASED_OUTPATIENT_CLINIC_OR_DEPARTMENT_OTHER): Payer: Self-pay

## 2017-01-20 ENCOUNTER — Other Ambulatory Visit (HOSPITAL_BASED_OUTPATIENT_CLINIC_OR_DEPARTMENT_OTHER): Payer: Medicare Other

## 2017-01-20 ENCOUNTER — Ambulatory Visit (HOSPITAL_BASED_OUTPATIENT_CLINIC_OR_DEPARTMENT_OTHER)
Admission: RE | Admit: 2017-01-20 | Discharge: 2017-01-20 | Disposition: A | Discharge status: Home / Self Care | Payer: Medicare Other | Source: Ambulatory Visit | Attending: Hematology & Oncology | Admitting: Hematology & Oncology

## 2017-01-20 ENCOUNTER — Ambulatory Visit (HOSPITAL_BASED_OUTPATIENT_CLINIC_OR_DEPARTMENT_OTHER): Payer: Medicare Other

## 2017-01-20 VITALS — BP 153/73 | HR 76 | Temp 98.3°F | Resp 18 | Wt 128.0 lb

## 2017-01-20 DIAGNOSIS — C3490 Malignant neoplasm of unspecified part of unspecified bronchus or lung: Secondary | ICD-10-CM | POA: Diagnosis not present

## 2017-01-20 DIAGNOSIS — R918 Other nonspecific abnormal finding of lung field: Secondary | ICD-10-CM | POA: Diagnosis not present

## 2017-01-20 DIAGNOSIS — M25512 Pain in left shoulder: Secondary | ICD-10-CM

## 2017-01-20 DIAGNOSIS — C3431 Malignant neoplasm of lower lobe, right bronchus or lung: Secondary | ICD-10-CM

## 2017-01-20 DIAGNOSIS — Z23 Encounter for immunization: Secondary | ICD-10-CM | POA: Diagnosis not present

## 2017-01-20 DIAGNOSIS — I700 Atherosclerosis of aorta: Secondary | ICD-10-CM | POA: Insufficient documentation

## 2017-01-20 DIAGNOSIS — Z5112 Encounter for antineoplastic immunotherapy: Secondary | ICD-10-CM

## 2017-01-20 DIAGNOSIS — Z923 Personal history of irradiation: Secondary | ICD-10-CM | POA: Diagnosis not present

## 2017-01-20 DIAGNOSIS — S8990XS Unspecified injury of unspecified lower leg, sequela: Secondary | ICD-10-CM

## 2017-01-20 LAB — CBC WITH DIFFERENTIAL (CANCER CENTER ONLY)
BASO#: 0 10*3/uL (ref 0.0–0.2)
BASO%: 0.2 % (ref 0.0–2.0)
EOS%: 0.6 % (ref 0.0–7.0)
Eosinophils Absolute: 0.1 10*3/uL (ref 0.0–0.5)
HEMATOCRIT: 39.9 % (ref 34.8–46.6)
HGB: 13.2 g/dL (ref 11.6–15.9)
LYMPH#: 0.5 10*3/uL — ABNORMAL LOW (ref 0.9–3.3)
LYMPH%: 5.7 % — AB (ref 14.0–48.0)
MCH: 28.5 pg (ref 26.0–34.0)
MCHC: 33.1 g/dL (ref 32.0–36.0)
MCV: 86 fL (ref 81–101)
MONO#: 0.4 10*3/uL (ref 0.1–0.9)
MONO%: 4.6 % (ref 0.0–13.0)
NEUT#: 7.2 10*3/uL — ABNORMAL HIGH (ref 1.5–6.5)
NEUT%: 88.9 % — AB (ref 39.6–80.0)
PLATELETS: 208 10*3/uL (ref 145–400)
RBC: 4.63 10*6/uL (ref 3.70–5.32)
RDW: 14.8 % (ref 11.1–15.7)
WBC: 8.1 10*3/uL (ref 3.9–10.0)

## 2017-01-20 LAB — CMP (CANCER CENTER ONLY)
ALT(SGPT): 20 U/L (ref 10–47)
AST: 23 U/L (ref 11–38)
Albumin: 3.7 g/dL (ref 3.3–5.5)
Alkaline Phosphatase: 88 U/L — ABNORMAL HIGH (ref 26–84)
BILIRUBIN TOTAL: 0.8 mg/dL (ref 0.20–1.60)
BUN: 15 mg/dL (ref 7–22)
CALCIUM: 9.4 mg/dL (ref 8.0–10.3)
CO2: 29 meq/L (ref 18–33)
Chloride: 98 mEq/L (ref 98–108)
Creat: 0.9 mg/dl (ref 0.6–1.2)
GLUCOSE: 137 mg/dL — AB (ref 73–118)
Potassium: 3.7 mEq/L (ref 3.3–4.7)
SODIUM: 138 meq/L (ref 128–145)
Total Protein: 7.8 g/dL (ref 6.4–8.1)

## 2017-01-20 LAB — LACTATE DEHYDROGENASE: LDH: 206 U/L (ref 125–245)

## 2017-01-20 MED ORDER — INFLUENZA VAC SPLIT QUAD 0.5 ML IM SUSY
PREFILLED_SYRINGE | INTRAMUSCULAR | Status: AC
  Filled 2017-01-20: qty 0.5

## 2017-01-20 MED ORDER — KETOROLAC TROMETHAMINE 15 MG/ML IJ SOLN
INTRAMUSCULAR | Status: AC
Start: 2017-01-20 — End: 2017-01-20
  Filled 2017-01-20: qty 2

## 2017-01-20 MED ORDER — INFLUENZA VAC SPLIT QUAD 0.5 ML IM SUSY
0.5000 mL | PREFILLED_SYRINGE | Freq: Once | INTRAMUSCULAR | Status: AC
  Administered 2017-01-20: 0.5 mL via INTRAMUSCULAR

## 2017-01-20 MED ORDER — KETOROLAC TROMETHAMINE 15 MG/ML IJ SOLN
30.0000 mg | Freq: Once | INTRAMUSCULAR | Status: AC
  Administered 2017-01-20: 30 mg via INTRAVENOUS

## 2017-01-20 MED ORDER — IOPAMIDOL (ISOVUE-300) INJECTION 61%
100.0000 mL | Freq: Once | INTRAVENOUS | Status: AC | PRN
  Administered 2017-01-20: 80 mL via INTRAVENOUS

## 2017-01-20 MED ORDER — SODIUM CHLORIDE 0.9 % IV SOLN
1200.0000 mg | Freq: Once | INTRAVENOUS | Status: AC
  Administered 2017-01-20: 1200 mg via INTRAVENOUS
  Filled 2017-01-20: qty 20

## 2017-01-20 MED ORDER — KETOROLAC TROMETHAMINE 30 MG/ML IJ SOLN
30.0000 mg | Freq: Once | INTRAMUSCULAR | Status: DC
  Filled 2017-01-20: qty 1

## 2017-01-20 MED ORDER — SODIUM CHLORIDE 0.9 % IV SOLN
Freq: Once | INTRAVENOUS | Status: AC
  Administered 2017-01-20: 12:00:00 via INTRAVENOUS

## 2017-01-20 NOTE — Progress Notes (Signed)
Dictation #1 [id number]  [id number]  Hematology and Oncology Follow Up Visit  Samantha Quinn [id number] 1927/11/08 83 y.o. 01/20/2017   Principle Diagnosis:   Stage IV adenocarcinoma of the right lung-mutation negative  Current Therapy:    Tecentriq 1200 mg IV every 3 weeks s/p cycle #8  Status post radiation/chemotherapy for recurrence-completed October 2017  Status post right upper lobectomy in March 2013 for stage IB disease     Interim History:  Samantha Quinn is back for follow-up. She still is having pain in the left shoulder. She saw Dr. Rhona Raider of orthopedic surgery. He will see her next week. He thinks it might be from her neck. An MRI probably will have to be done.  She had a CT scan done today. Thankfully, the CT scan showed everything was stable. The mass in the right lower lobe measures 2.3 x 1.4 cm. This is stable. No new diseases noted.  She's had no cough. She's had no shortness of breath. She's had no bleeding. There's been no nausea or vomiting. She's had no diarrhea. She's had no rashes. There's been no leg swelling.  Overall, her performance status is ECOG 2.   Medications:  Current Outpatient Prescriptions:  .  amitriptyline (ELAVIL) 10 MG tablet, Take 1 tablet (10 mg total) by mouth at bedtime. (Patient taking differently: Take 5 mg by mouth at bedtime. ), Disp: 90 tablet, Rfl: 1 .  bisoprolol-hydrochlorothiazide (ZIAC) 2.5-6.25 MG tablet, TAKE 1 TABLET DAILY, Disp: 90 tablet, Rfl: 3 .  Blood Glucose Monitoring Suppl (ONE TOUCH ULTRA 2) w/Device KIT, Use to check blood sugars twice a day Dx E11.9, Disp: 1 each, Rfl: 0 .  calcium carbonate (OS-CAL) 600 MG tablet, Take 600 mg by mouth daily., Disp: , Rfl:  .  gabapentin (NEURONTIN) 100 MG capsule, Take 100 mg by mouth 3 (three) times daily. , Disp: , Rfl:  .  glucose blood (ONE TOUCH ULTRA TEST) test strip, 1 each by Other route 2 (two) times daily. Use to check blood sugars twice a day, Disp: 100 each, Rfl:  3 .  HYDROcodone-homatropine (HYCODAN) 5-1.5 MG/5ML syrup, Take 5 mLs by mouth every 6 (six) hours as needed for cough. (Patient taking differently: Take 2.5 mLs by mouth every 6 (six) hours as needed for cough. ), Disp: 180 mL, Rfl: 0 .  ibuprofen (ADVIL,MOTRIN) 200 MG tablet, Take 200 mg by mouth every 6 (six) hours as needed for fever, headache or mild pain. , Disp: , Rfl:  .  Lancets (ONETOUCH ULTRASOFT) lancets, 1 each by Other route 2 (two) times daily. Use to help check blood sugars twice a day, Disp: 100 each, Rfl: 3 .  lovastatin (MEVACOR) 20 MG tablet, TAKE 1 TABLET BY MOUTH EVERY OTHER DAY, Disp: 90 tablet, Rfl: 1 .  metFORMIN (GLUCOPHAGE) 500 MG tablet, TAKE 1 TABLET 2 TIMES DAILY, Disp: 180 tablet, Rfl: 1 .  methimazole (TAPAZOLE) 5 MG tablet, Take 0.5 tablets (2.5 mg total) by mouth daily. (Patient taking differently: Take 2.5 mg by mouth. Take one every other day.), Disp: 30 tablet, Rfl: 3 .  Multiple Vitamins-Minerals (VISION-VITE PRESERVE PO), Take by mouth 2 (two) times daily., Disp: , Rfl:   Allergies:  Allergies  Allergen Reactions  . Codeine Nausea And Vomiting  . Tramadol Other (See Comments)    syncope  . Demerol Nausea Only    Past Medical History, Surgical history, Social history, and Family History were reviewed and updated.  Review of Systems: As stated in the interim  history  Physical Exam:  weight is 128 lb (58.1 kg). Her oral temperature is 98.3 F (36.8 C). Her blood pressure is 153/73 (abnormal) and her pulse is 76. Her respiration is 18 and oxygen saturation is 99%.   Wt Readings from Last 3 Encounters:  01/20/17 128 lb (58.1 kg)  01/18/17 129 lb (58.5 kg)  12/23/16 130 lb (59 kg)     Physical Exam  Constitutional: She is oriented to person, place, and time.  HENT:  Head: Normocephalic and atraumatic.  Mouth/Throat: Oropharynx is clear and moist.  Eyes: Pupils are equal, round, and reactive to light. EOM are normal.  Neck: Normal range of  motion.  Cardiovascular: Normal rate, regular rhythm and normal heart sounds.   Pulmonary/Chest: Effort normal and breath sounds normal.  Abdominal: Soft. Bowel sounds are normal.  Musculoskeletal: Normal range of motion. She exhibits no edema, tenderness or deformity.  She does have tenderness in the posterior left shoulder. She has moderate decreased range of motion of the left shoulder. She has partial abduction of the left arm.  Lymphadenopathy:    She has no cervical adenopathy.  Neurological: She is alert and oriented to person, place, and time.  Skin: Skin is warm and dry. No rash noted. No erythema.  Psychiatric: She has a normal mood and affect. Her behavior is normal. Judgment and thought content normal.  Vitals reviewed.    Lab Results  Component Value Date   WBC 8.1 01/20/2017   HGB 13.2 01/20/2017   HCT 39.9 01/20/2017   MCV 86 01/20/2017   PLT 208 01/20/2017     Chemistry      Component Value Date/Time   NA 138 01/20/2017 1035   NA 139 06/08/2016 1316   K 3.7 01/20/2017 1035   K 3.7 06/08/2016 1316   CL 98 01/20/2017 1035   CO2 29 01/20/2017 1035   CO2 24 06/08/2016 1316   BUN 15 01/20/2017 1035   BUN 13.3 06/08/2016 1316   CREATININE 0.9 01/20/2017 1035   CREATININE 0.7 06/08/2016 1316      Component Value Date/Time   CALCIUM 9.4 01/20/2017 1035   CALCIUM 9.8 06/08/2016 1316   ALKPHOS 88 (H) 01/20/2017 1035   ALKPHOS 79 06/08/2016 1316   AST 23 01/20/2017 1035   AST 14 06/08/2016 1316   ALT 20 01/20/2017 1035   ALT 11 06/08/2016 1316   BILITOT 0.80 01/20/2017 1035   BILITOT 0.25 06/08/2016 1316         Impression and Plan: Samantha Quinn is a 47birth-year-old white female. She has recurrent non-small cell lung cancer. This appears to be adenocarcinoma. She does not have any mutations that we can target.  We will go ahead with her 9th cycle of treatment. Since her CT scan looked stable, I see no reason that we need to make any changes.  Hopefully,  her left shoulder will improve. I will give her a dose of Toradol today.  His sounds like she may ask to have neck issues. She has some tingling in the thumb and first 2 fingers. She sees orthopedic surgery. She is also had a neurosurgeon. She goes back to orthopedic surgery next week.  We will plan to get her back to see [place] in another 3 weeks.     [name], MD 9/26/201811:33 AM

## 2017-01-20 NOTE — Patient Instructions (Signed)
Atezolizumab injection What is this medicine? ATEZOLIZUMAB (a te zoe LIZ ue mab) is a monoclonal antibody. It is used to treat bladder cancer (urothelial cancer) and non-small cell lung cancer. This medicine may be used for other purposes; ask your health care provider or pharmacist if you have questions. COMMON BRAND NAME(S): Tecentriq What should I tell my health care provider before I take this medicine? They need to know if you have any of these conditions: -diabetes -immune system problems -infection -inflammatory bowel disease -liver disease -lung or breathing disease -lupus -nervous system problems like myasthenia gravis or Guillain-Barre syndrome -organ transplant -an unusual or allergic reaction to atezolizumab, other medicines, foods, dyes, or preservatives -pregnant or trying to get pregnant -breast-feeding How should I use this medicine? This medicine is for infusion into a vein. It is given by a health care professional in a hospital or clinic setting. A special MedGuide will be given to you before each treatment. Be sure to read this information carefully each time. Talk to your pediatrician regarding the use of this medicine in children. Special care may be needed. Overdosage: If you think you have taken too much of this medicine contact a poison control center or emergency room at once. NOTE: This medicine is only for you. Do not share this medicine with others. What if I miss a dose? It is important not to miss your dose. Call your doctor or health care professional if you are unable to keep an appointment. What may interact with this medicine? Interactions have not been studied. This list may not describe all possible interactions. Give your health care provider a list of all the medicines, herbs, non-prescription drugs, or dietary supplements you use. Also tell them if you smoke, drink alcohol, or use illegal drugs. Some items may interact with your medicine. What  should I watch for while using this medicine? Your condition will be monitored carefully while you are receiving this medicine. You may need blood work done while you are taking this medicine. Do not become pregnant while taking this medicine or for at least 5 months after stopping it. Women should inform their doctor if they wish to become pregnant or think they might be pregnant. There is a potential for serious side effects to an unborn child. Talk to your health care professional or pharmacist for more information. Do not breast-feed an infant while taking this medicine or for at least 5 months after the last dose. What side effects may I notice from receiving this medicine? Side effects that you should report to your doctor or health care professional as soon as possible: -allergic reactions like skin rash, itching or hives, swelling of the face, lips, or tongue -black, tarry stools -bloody or watery diarrhea -breathing problems -changes in vision -chest pain or chest tightness -chills -facial flushing -fever -headache -signs and symptoms of high blood sugar such as dizziness; dry mouth; dry skin; fruity breath; nausea; stomach pain; increased hunger or thirst; increased urination -signs and symptoms of liver injury like dark yellow or brown urine; general ill feeling or flu-like symptoms; light-colored stools; loss of appetite; nausea; right upper belly pain; unusually weak or tired; yellowing of the eyes or skin -stomach pain -trouble passing urine or change in the amount of urine Side effects that usually do not require medical attention (report to your doctor or health care professional if they continue or are bothersome): -cough -diarrhea -joint pain -muscle pain -muscle weakness -tiredness -weight loss This list may not describe all   possible side effects. Call your doctor for medical advice about side effects. You may report side effects to FDA at 1-800-FDA-1088. Where should  I keep my medicine? This drug is given in a hospital or clinic and will not be stored at home. NOTE: This sheet is a summary. It may not cover all possible information. If you have questions about this medicine, talk to your doctor, pharmacist, or health care provider.  2018 Elsevier/Gold Standard (2015-05-15 17:54:14)  

## 2017-01-21 ENCOUNTER — Ambulatory Visit: Payer: Medicare Other | Admitting: Physical Therapy

## 2017-01-21 DIAGNOSIS — R293 Abnormal posture: Secondary | ICD-10-CM

## 2017-01-21 DIAGNOSIS — M542 Cervicalgia: Secondary | ICD-10-CM

## 2017-01-21 DIAGNOSIS — R262 Difficulty in walking, not elsewhere classified: Secondary | ICD-10-CM | POA: Diagnosis not present

## 2017-01-21 DIAGNOSIS — M6281 Muscle weakness (generalized): Secondary | ICD-10-CM | POA: Diagnosis not present

## 2017-01-21 DIAGNOSIS — G8929 Other chronic pain: Secondary | ICD-10-CM | POA: Diagnosis not present

## 2017-01-21 DIAGNOSIS — M545 Low back pain: Secondary | ICD-10-CM | POA: Diagnosis not present

## 2017-01-21 DIAGNOSIS — M25512 Pain in left shoulder: Secondary | ICD-10-CM

## 2017-01-21 NOTE — Therapy (Signed)
[facility] [address]  [place], [place], [address] Phone: [phone]   Fax:  [phone]  Physical Therapy Treatment  Patient Details  Name: Samantha Quinn MRN: [id number] Date of Birth: April 06, 1928 Referring Provider: Dr. Melrose Nakayama  Encounter Date: 01/21/2017      PT End of Session - 01/21/17 1538    Visit Number 12   Number of Visits 20   Date for PT Re-Evaluation 02/25/17   Authorization Type Medicare   PT Start Time 1533   PT Stop Time 1622   PT Time Calculation (min) 49 min   Activity Tolerance Patient tolerated treatment well   Behavior During Therapy [facility] for tasks assessed/performed      Past Medical History:  Diagnosis Date  . Back pain    bulding disc  . Bronchitis    hx of-many,many years   . Diabetes mellitus   . Diabetes mellitus    type 2 and takes Metformin bid  . Dyslipidemia   . Embolism - blood clot    hx of in left lower leg and was on Coumadin for about 30months;this was about 8-40yrs ago  . Family history of adverse reaction to anesthesia    Daughter- Nausea  . History of colonic polyps   . HOH (hard of hearing)   . Hx of migraines    as a teenager  . Hypercholesterolemia   . Hyperlipidemia    takes Lovastatin every other day  . Hypertension    takes Ziac daily and Ramipril as well  . Hypothyroidism    takes Synthroid every other day  . Insomnia    takes Elavil nightly  . Joint pain   . Lung mass   . Myalgia   . Osteoarthritis   . Osteoporosis   . Pneumothorax of right lung after biopsy 07/09/11   HAD CHEST TUBE PLACEMENT  . PONV (postoperative nausea and vomiting)   . Primary adenocarcinoma of lower lobe of right lung ([place]) 06/24/2011   pT2a, pN0 M0 Stage 1 Well differenced Adenocarcinoma 3.5 cm resected 07/17/2011  . Primary adenocarcinoma of lung ([place]) 06/24/2011  . Shortness of breath dyspnea    With exertion  . Skin cancer    legs have dark spots on them  .  Upper respiratory infection 04/2011  . Urinary incontinence    takes Detrol daily prn    Past Surgical History:  Procedure Laterality Date  . ABDOMINAL HYSTERECTOMY    . APPENDECTOMY     as a child  . CHEST TUBE REMOVAL  07/13/11   RIGHT  CHEST TUBE  . CHOLECYSTECTOMY    . COLONOSCOPY    . COLONOSCOPY    . DILATION AND CURETTAGE OF UTERUS     x 2  . EYE SURGERY Bilateral    Cataract  . HEMORRHOID SURGERY    . LUNG BIOPSY  07/09/11   RIGHT UPER LOBE LUNG MASS   . RIGHT CHEST TUBE PLACEMENT  07/10/11   S/P LUNG BX  . ROTATOR CUFF REPAIR     bilateral  . TOE SURGERY  2013   right small toe  . TONSILLECTOMY     as a child  . VIDEO BRONCHOSCOPY  07/17/2011   Procedure: VIDEO BRONCHOSCOPY;  Surgeon: Grace Isaac, MD;  Location: [facility];  Service: Thoracic;  Laterality: N/A;  . VIDEO BRONCHOSCOPY WITH ENDOBRONCHIAL ULTRASOUND N/A 12/10/2015   Procedure: VIDEO BRONCHOSCOPY WITH ENDOBRONCHIAL ULTRASOUND with biopsies.;  Surgeon: Percell Miller  Maryruth Bun, MD;  Location: [facility];  Service: Thoracic;  Laterality: N/A;    There were no vitals filed for this visit.      Subjective Assessment - 01/21/17 1538    Subjective felt better after manual therapy last session; had increase in pain with looking up in closet this week.   Pertinent History lung cancer, DM 2, HTN, osteoporosis   Diagnostic tests Xray and MRI: "ruptured disc" per patient   Patient Stated Goals improve pain and function   Currently in Pain? Yes   Pain Score 8    Pain Location Shoulder   Pain Orientation Left   Pain Descriptors / Indicators Aching;Sore;Constant   Pain Type Acute pain                         [facility] Adult PT Treatment/Exercise - 01/21/17 0001      Neck Exercises: Machines for Strengthening   UBE (Upper Arm Bike) L1 x 5 min - forward     Neck Exercises: Seated   Neck Retraction 5 reps;5 secs   Other Seated Exercise scap retraction 10 x 5 sec   Other Seated Exercise median nerve glide -  cervical and wrist bias     Manual Therapy   Manual Therapy Joint mobilization;Soft tissue mobilization;Myofascial release;Passive ROM;Manual Traction   Manual therapy comments patient supine   Joint Mobilization gentle Grade I CPA of lower cervical spine - some pain with this, thus terminated   Soft tissue mobilization STM to L UT< L LS, L infra/teres, L cervical paraspinals   Myofascial Release manual trigger point release of L UT and L infra   Passive ROM manual L UT stretch 2 x 60 sec, R rotation stretch x 30 sec; L rotation x 30 seconds - reproducton of symptoms   Manual Traction manual traction 5 x 30-45 seconds                  PT Short Term Goals - 01/14/17 1802      PT SHORT TERM GOAL #1   Title patient to be independent with initial HEP for lumbar AROM and stretching    Status Achieved           PT Long Term Goals - 01/14/17 1802      PT LONG TERM GOAL #1   Title patient to be independent with advanced HEP    Status On-going     PT LONG TERM GOAL #2   Title patient to improve lumbar AROM to [facility] in all planes without pain limiting   Status On-going     PT LONG TERM GOAL #3   Title patient to report ability to ambulate for >/= 30 min for cardiovascualr benefit without pain limiting   Status On-going     PT LONG TERM GOAL #4   Title patient to improve L LE strength to >/= 4+/5   Status On-going     PT LONG TERM GOAL #5   Title patient to report ability to perform household chores and ADLs without pain limiting   Status On-going     PT LONG TERM GOAL #6   Title patient to demonstrate cervical AROM WFL and symmetrical with no pain provocation/reproduction   Status On-going     PT LONG TERM GOAL #7   Title patient to report shoulder/arm pain no greater than 2/10 for greater than 2 weeks   Status On-going  Plan - 01/21/17 1752    Clinical Impression Statement Patient reporting improvement in symtpoms for short period last  session after heavy manual focus. Noticed na increase in radicular symptoms this week while looking up into closet. PT able to reproduce N&T into hand, along median nerve pathway, with L cervical flexion demonstrating a likely cervical origin for this. Pain however is reproduced with palpation and deep STM alon L UT, L LS, and L infra/teres with pain into L shoulder joint and into bicep region. Given median nerve glide today along with reinforcement for scap retraction and chin tucks to help promote good postural alignment to maximize space at cervical spine joints. Will continue to progress as patient tolerates.    PT Treatment/Interventions ADLs/Self Care Home Management;Cryotherapy;Electrical Stimulation;Iontophoresis 4mg /ml Dexamethasone;Moist Heat;Ultrasound;Neuromuscular re-education;Balance training;Therapeutic exercise;Therapeutic activities;Functional mobility training;Stair training;Gait training;Patient/family education;Manual techniques;Passive range of motion;Vasopneumatic Device;Taping;Dry needling   Consulted and Agree with Plan of Care Patient      Patient will benefit from skilled therapeutic intervention in order to improve the following deficits and impairments:  Decreased activity tolerance, Decreased range of motion, Decreased mobility, Decreased strength, Difficulty walking, Pain, Impaired UE functional use, Postural dysfunction  Visit Diagnosis: Chronic left-sided low back pain, with sciatica presence unspecified  Abnormal posture  Muscle weakness (generalized)  Difficulty in walking, not elsewhere classified  Cervicalgia  Acute pain of left shoulder     Problem List Patient Active Problem List   Diagnosis Date Noted  . Thyrotoxicosis without thyroid storm 01/18/2017  . Bilateral hearing loss 08/27/2016  . Nasal fracture 08/27/2016  . Knee injury 08/27/2016  . Head injury 08/27/2016  . Toxic multinodular goiter w/o crisis 06/22/2016  . Fatigue 05/05/2016  .  Macular degeneration 03/09/2016  . Encounter for antineoplastic chemotherapy 12/19/2015  . Meralgia paresthetica of left side 07/10/2015  . Osteoporosis 07/10/2015  . Onychomycosis 07/10/2015  . Anxiety 07/10/2015  . Diabetes ([place]) 07/10/2015  . Cough 12/12/2013  . Pneumothorax after biopsy 07/09/2011    Class: Acute  . Primary adenocarcinoma of lower lobe of right lung ([place]) 06/24/2011  . Hypercholesterolemia 10/20/2010  . Essential hypertension, benign 10/20/2010  . Osteoarthritis 10/20/2010     Lanney Gins, PT, DPT 01/21/17 6:03 PM   [facility] [address]  [place], [place], [address] Phone: [phone]   Fax:  [phone]  Name: DURINDA BUZZELLI MRN: [id number] Date of Birth: February 12, 1928

## 2017-01-22 DIAGNOSIS — Z85828 Personal history of other malignant neoplasm of skin: Secondary | ICD-10-CM | POA: Diagnosis not present

## 2017-01-22 DIAGNOSIS — D225 Melanocytic nevi of trunk: Secondary | ICD-10-CM | POA: Diagnosis not present

## 2017-01-22 DIAGNOSIS — L814 Other melanin hyperpigmentation: Secondary | ICD-10-CM | POA: Diagnosis not present

## 2017-01-22 DIAGNOSIS — D692 Other nonthrombocytopenic purpura: Secondary | ICD-10-CM | POA: Diagnosis not present

## 2017-01-22 DIAGNOSIS — L821 Other seborrheic keratosis: Secondary | ICD-10-CM | POA: Diagnosis not present

## 2017-01-22 DIAGNOSIS — D2262 Melanocytic nevi of left upper limb, including shoulder: Secondary | ICD-10-CM | POA: Diagnosis not present

## 2017-01-22 DIAGNOSIS — L570 Actinic keratosis: Secondary | ICD-10-CM | POA: Diagnosis not present

## 2017-01-22 DIAGNOSIS — D1801 Hemangioma of skin and subcutaneous tissue: Secondary | ICD-10-CM | POA: Diagnosis not present

## 2017-01-26 ENCOUNTER — Ambulatory Visit: Payer: Medicare Other | Attending: Neurosurgery | Admitting: Physical Therapy

## 2017-01-26 DIAGNOSIS — R293 Abnormal posture: Secondary | ICD-10-CM

## 2017-01-26 DIAGNOSIS — M545 Low back pain: Secondary | ICD-10-CM | POA: Diagnosis not present

## 2017-01-26 DIAGNOSIS — M6281 Muscle weakness (generalized): Secondary | ICD-10-CM

## 2017-01-26 DIAGNOSIS — M542 Cervicalgia: Secondary | ICD-10-CM | POA: Insufficient documentation

## 2017-01-26 DIAGNOSIS — R262 Difficulty in walking, not elsewhere classified: Secondary | ICD-10-CM | POA: Insufficient documentation

## 2017-01-26 DIAGNOSIS — M25512 Pain in left shoulder: Secondary | ICD-10-CM | POA: Diagnosis not present

## 2017-01-26 DIAGNOSIS — G8929 Other chronic pain: Secondary | ICD-10-CM | POA: Diagnosis not present

## 2017-01-26 NOTE — Therapy (Signed)
[facility] [address]  [place] , [place], [address] Phone: [phone]   Fax:  [phone]  Physical Therapy Treatment  Patient Details  Name: Samantha Quinn MRN: [id number] Date of Birth: 1927/12/25 Referring Provider: Dr. Melrose Nakayama  Encounter Date: 01/26/2017      PT End of Session - 01/26/17 0925    Visit Number 13   Number of Visits 20   Date for PT Re-Evaluation 02/25/17   Authorization Type Medicare   PT Start Time 0922   PT Stop Time 1025  moist heat to end session   PT Time Calculation (min) 63 min   Activity Tolerance Patient tolerated treatment well   Behavior During Therapy [facility] for tasks assessed/performed      Past Medical History:  Diagnosis Date  . Back pain    bulding disc  . Bronchitis    hx of-many,many years   . Diabetes mellitus   . Diabetes mellitus    type 2 and takes Metformin bid  . Dyslipidemia   . Embolism - blood clot    hx of in left lower leg and was on Coumadin for about 65months;this was about 8-37yrs ago  . Family history of adverse reaction to anesthesia    Daughter- Nausea  . History of colonic polyps   . HOH (hard of hearing)   . Hx of migraines    as a teenager  . Hypercholesterolemia   . Hyperlipidemia    takes Lovastatin every other day  . Hypertension    takes Ziac daily and Ramipril as well  . Hypothyroidism    takes Synthroid every other day  . Insomnia    takes Elavil nightly  . Joint pain   . Lung mass   . Myalgia   . Osteoarthritis   . Osteoporosis   . Pneumothorax of right lung after biopsy 07/09/11   HAD CHEST TUBE PLACEMENT  . PONV (postoperative nausea and vomiting)   . Primary adenocarcinoma of lower lobe of right lung ([place]) 06/24/2011   pT2a, pN0 M0 Stage 1 Well differenced Adenocarcinoma 3.5 cm resected 07/17/2011  . Primary adenocarcinoma of lung ([place]) 06/24/2011  . Shortness of breath dyspnea    With exertion  . Skin cancer    legs  have dark spots on them  . Upper respiratory infection 04/2011  . Urinary incontinence    takes Detrol daily prn    Past Surgical History:  Procedure Laterality Date  . ABDOMINAL HYSTERECTOMY    . APPENDECTOMY     as a child  . CHEST TUBE REMOVAL  07/13/11   RIGHT  CHEST TUBE  . CHOLECYSTECTOMY    . COLONOSCOPY    . COLONOSCOPY    . DILATION AND CURETTAGE OF UTERUS     x 2  . EYE SURGERY Bilateral    Cataract  . HEMORRHOID SURGERY    . LUNG BIOPSY  07/09/11   RIGHT UPER LOBE LUNG MASS   . RIGHT CHEST TUBE PLACEMENT  07/10/11   S/P LUNG BX  . ROTATOR CUFF REPAIR     bilateral  . TOE SURGERY  2013   right small toe  . TONSILLECTOMY     as a child  . VIDEO BRONCHOSCOPY  07/17/2011   Procedure: VIDEO BRONCHOSCOPY;  Surgeon: Grace Isaac, MD;  Location: [facility];  Service: Thoracic;  Laterality: N/A;  . VIDEO BRONCHOSCOPY WITH ENDOBRONCHIAL ULTRASOUND N/A 12/10/2015   Procedure: VIDEO BRONCHOSCOPY WITH ENDOBRONCHIAL  ULTRASOUND with biopsies.;  Surgeon: Grace Isaac, MD;  Location[facility] OR;  Service: Thoracic;  Laterality: N/A;    There were no vitals filed for this visit.      Subjective Assessment - 01/26/17 0923    Subjective Has been taking 2 extra strength tylenol with some pain relief.    Pertinent History lung cancer, DM 2, HTN, osteoporosis   Patient Stated Goals improve pain and function   Currently in Pain? Yes   Pain Score 8    Pain Location Shoulder   Pain Orientation Left   Pain Descriptors / Indicators Aching;Sore   Pain Type Acute pain                         [facility] Adult PT Treatment/Exercise - 01/26/17 0927      Lumbar Exercises: Standing   Functional Squats 15 reps   Functional Squats Limitations at counter   Other Standing Lumbar Exercises B hip extension with 2# x 15 each; B hip abduction with 2# x 15 each     Knee/Hip Exercises: Aerobic   Nustep L5 x 6 min (BLE/R UE)     Modalities   Modalities Moist Heat     Moist Heat  Therapy   Number Minutes Moist Heat 15 Minutes   Moist Heat Location Shoulder     Manual Therapy   Manual Therapy Soft tissue mobilization;Myofascial release;Joint mobilization;Passive ROM;Manual Traction   Manual therapy comments patient supine   Joint Mobilization gentle AP/PA mobs to L shoulder - initially reduced pain, however then made N&T increase; long axis distraction to L shoulder - increase in pain, thus terminated   Soft tissue mobilization STM to L UT, L LS, L lats, L infrapsinatus, L pecs, B cervical paraspinals   Myofascial Release manual trigger point release to L UT   Passive ROM manual L UT stretch x 60 seconds; L shoulder flexion 6 x 10 sec holds with pain reduction; L shoulder flexion 2 x 15 seconds with pain increase   Manual Traction manual traction 5 x 30-45 seconds wiht improvement in symptoms                  PT Short Term Goals - 01/14/17 1802      PT SHORT TERM GOAL #1   Title patient to be independent with initial HEP for lumbar AROM and stretching    Status Achieved           PT Long Term Goals - 01/14/17 1802      PT LONG TERM GOAL #1   Title patient to be independent with advanced HEP    Status On-going     PT LONG TERM GOAL #2   Title patient to improve lumbar AROM to [facility] in all planes without pain limiting   Status On-going     PT LONG TERM GOAL #3   Title patient to report ability to ambulate for >/= 30 min for cardiovascualr benefit without pain limiting   Status On-going     PT LONG TERM GOAL #4   Title patient to improve L LE strength to >/= 4+/5   Status On-going     PT LONG TERM GOAL #5   Title patient to report ability to perform household chores and ADLs without pain limiting   Status On-going     PT LONG TERM GOAL #6   Title patient to demonstrate cervical AROM WFL and symmetrical with no pain provocation/reproduction  Status On-going     PT LONG TERM GOAL #7   Title patient to report shoulder/arm pain no greater  than 2/10 for greater than 2 weeks   Status On-going               Plan - 01/26/17 0926    Clinical Impression Statement Patient continuing to report high pain levels at L shoulder/neck area with patient reporting she is taking 2 Tylenol every 6 hours prn for pain with some relief. Some hip strengthening included today with patient unable to bear weight through L shoulder/arm for balance without increase in pain and N&T into L UE. Radicular symptoms continue with patient noting most symtpoms with L cervical rotation and L lateral flexions with N&T primarily in the median nn. distribution. Did provide patient with nerve glides at last session with little to no decrease in symptoms. Patient responding well to STM to L shoulder and cervical region, with reports of general tenderness and pain to shoulder complex and neck. PT session have included stretching, strenghtening, taping, STM, dry needling with little carryover from day to day. Patient to follow-up tomorrow with ortho MD.    PT Treatment/Interventions ADLs/Self Care Home Management;Cryotherapy;Electrical Stimulation;Iontophoresis 4mg /ml Dexamethasone;Moist Heat;Ultrasound;Neuromuscular re-education;Balance training;Therapeutic exercise;Therapeutic activities;Functional mobility training;Stair training;Gait training;Patient/family education;Manual techniques;Passive range of motion;Vasopneumatic Device;Taping;Dry needling   Consulted and Agree with Plan of Care Patient      Patient will benefit from skilled therapeutic intervention in order to improve the following deficits and impairments:  Decreased activity tolerance, Decreased range of motion, Decreased mobility, Decreased strength, Difficulty walking, Pain, Impaired UE functional use, Postural dysfunction  Visit Diagnosis: Chronic left-sided low back pain, with sciatica presence unspecified  Abnormal posture  Muscle weakness (generalized)  Difficulty in walking, not elsewhere  classified  Cervicalgia  Acute pain of left shoulder     Problem List Patient Active Problem List   Diagnosis Date Noted  . Thyrotoxicosis without thyroid storm 01/18/2017  . Bilateral hearing loss 08/27/2016  . Nasal fracture 08/27/2016  . Knee injury 08/27/2016  . Head injury 08/27/2016  . Toxic multinodular goiter w/o crisis 06/22/2016  . Fatigue 05/05/2016  . Macular degeneration 03/09/2016  . Encounter for antineoplastic chemotherapy 12/19/2015  . Meralgia paresthetica of left side 07/10/2015  . Osteoporosis 07/10/2015  . Onychomycosis 07/10/2015  . Anxiety 07/10/2015  . Diabetes ([place]) 07/10/2015  . Cough 12/12/2013  . Pneumothorax after biopsy 07/09/2011    Class: Acute  . Primary adenocarcinoma of lower lobe of right lung ([place]) 06/24/2011  . Hypercholesterolemia 10/20/2010  . Essential hypertension, benign 10/20/2010  . Osteoarthritis 10/20/2010     Lanney Gins, PT, DPT 01/26/17 11:29 AM   [facility] [address]  [place], [place], [address] Phone: [phone]   Fax:  [phone]  Name: Samantha Quinn MRN: [id number] Date of Birth: 1928/02/19

## 2017-01-27 DIAGNOSIS — M5412 Radiculopathy, cervical region: Secondary | ICD-10-CM | POA: Diagnosis not present

## 2017-01-28 ENCOUNTER — Ambulatory Visit: Payer: Medicare Other | Admitting: Physical Therapy

## 2017-01-28 ENCOUNTER — Other Ambulatory Visit: Payer: Self-pay | Admitting: Orthopaedic Surgery

## 2017-01-28 DIAGNOSIS — R293 Abnormal posture: Secondary | ICD-10-CM | POA: Diagnosis not present

## 2017-01-28 DIAGNOSIS — M6281 Muscle weakness (generalized): Secondary | ICD-10-CM | POA: Diagnosis not present

## 2017-01-28 DIAGNOSIS — M542 Cervicalgia: Secondary | ICD-10-CM

## 2017-01-28 DIAGNOSIS — G8929 Other chronic pain: Secondary | ICD-10-CM

## 2017-01-28 DIAGNOSIS — R262 Difficulty in walking, not elsewhere classified: Secondary | ICD-10-CM

## 2017-01-28 DIAGNOSIS — M545 Low back pain: Principal | ICD-10-CM

## 2017-01-28 DIAGNOSIS — M25512 Pain in left shoulder: Secondary | ICD-10-CM

## 2017-01-28 DIAGNOSIS — M5412 Radiculopathy, cervical region: Secondary | ICD-10-CM

## 2017-01-28 NOTE — Therapy (Signed)
[facility] [address]  [place], [place], [address] Phone: [phone]   Fax:  [phone]  Physical Therapy Treatment  Patient Details  Name: Samantha Quinn MRN: [id number] Date of Birth: 04-02-1928 Referring Provider: Dr. Melrose Nakayama  Encounter Date: 01/28/2017      PT End of Session - 01/28/17 0939    Visit Number 14   Number of Visits 20   Date for PT Re-Evaluation 02/25/17   Authorization Type Medicare   PT Start Time 0933   PT Stop Time 1027  moist heat to end session   PT Time Calculation (min) 54 min   Activity Tolerance Patient tolerated treatment well   Behavior During Therapy [facility] for tasks assessed/performed      Past Medical History:  Diagnosis Date  . Back pain    bulding disc  . Bronchitis    hx of-many,many years   . Diabetes mellitus   . Diabetes mellitus    type 2 and takes Metformin bid  . Dyslipidemia   . Embolism - blood clot    hx of in left lower leg and was on Coumadin for about 35months;this was about 8-55yrs ago  . Family history of adverse reaction to anesthesia    Daughter- Nausea  . History of colonic polyps   . HOH (hard of hearing)   . Hx of migraines    as a teenager  . Hypercholesterolemia   . Hyperlipidemia    takes Lovastatin every other day  . Hypertension    takes Ziac daily and Ramipril as well  . Hypothyroidism    takes Synthroid every other day  . Insomnia    takes Elavil nightly  . Joint pain   . Lung mass   . Myalgia   . Osteoarthritis   . Osteoporosis   . Pneumothorax of right lung after biopsy 07/09/11   HAD CHEST TUBE PLACEMENT  . PONV (postoperative nausea and vomiting)   . Primary adenocarcinoma of lower lobe of right lung ([place]) 06/24/2011   pT2a, pN0 M0 Stage 1 Well differenced Adenocarcinoma 3.5 cm resected 07/17/2011  . Primary adenocarcinoma of lung ([place]) 06/24/2011  . Shortness of breath dyspnea    With exertion  . Skin cancer    legs  have dark spots on them  . Upper respiratory infection 04/2011  . Urinary incontinence    takes Detrol daily prn    Past Surgical History:  Procedure Laterality Date  . ABDOMINAL HYSTERECTOMY    . APPENDECTOMY     as a child  . CHEST TUBE REMOVAL  07/13/11   RIGHT  CHEST TUBE  . CHOLECYSTECTOMY    . COLONOSCOPY    . COLONOSCOPY    . DILATION AND CURETTAGE OF UTERUS     x 2  . EYE SURGERY Bilateral    Cataract  . HEMORRHOID SURGERY    . LUNG BIOPSY  07/09/11   RIGHT UPER LOBE LUNG MASS   . RIGHT CHEST TUBE PLACEMENT  07/10/11   S/P LUNG BX  . ROTATOR CUFF REPAIR     bilateral  . TOE SURGERY  2013   right small toe  . TONSILLECTOMY     as a child  . VIDEO BRONCHOSCOPY  07/17/2011   Procedure: VIDEO BRONCHOSCOPY;  Surgeon: Grace Isaac, MD;  Location: [place];  Service: Thoracic;  Laterality: N/A;  . VIDEO BRONCHOSCOPY WITH ENDOBRONCHIAL ULTRASOUND N/A 12/10/2015   Procedure: VIDEO BRONCHOSCOPY WITH ENDOBRONCHIAL  ULTRASOUND with biopsies.;  Surgeon: Grace Isaac, MD;  Location: [facility] OR;  Service: Thoracic;  Laterality: N/A;    There were no vitals filed for this visit.      Subjective Assessment - 01/28/17 0935    Subjective Saw MD yesterday - is ordering MRI   Pertinent History lung cancer, DM 2, HTN, osteoporosis   Patient Stated Goals improve pain and function   Currently in Pain? Yes   Pain Score 6    Pain Location Shoulder  and neck   Pain Orientation Left   Pain Descriptors / Indicators Aching;Sore;Tingling;Numbness   Pain Type Acute pain                         [facility] Adult PT Treatment/Exercise - 01/28/17 0939      Neck Exercises: Supine   Cervical Isometrics Flexion;Right lateral flexion;Left lateral flexion;5 secs;10 reps   Cervical Isometrics Limitations no pain   Other Supine Exercise L UE serratus punches 2 x 10     Lumbar Exercises: Supine   Ab Set 10 reps;5 seconds   Bridge 15 reps;3 seconds   Isometric Hip Flexion 15 reps;5  seconds   Isometric Hip Flexion Limitations feet propped on bolster     Knee/Hip Exercises: Aerobic   Nustep L4 x 7 min (LE only)     Moist Heat Therapy   Number Minutes Moist Heat 15 Minutes   Moist Heat Location Shoulder     Manual Therapy   Manual Therapy Soft tissue mobilization   Manual therapy comments patient supine   Soft tissue mobilization STM to B cervical paraspinals, B UT, L LS                  PT Short Term Goals - 01/14/17 1802      PT SHORT TERM GOAL #1   Title patient to be independent with initial HEP for lumbar AROM and stretching    Status Achieved           PT Long Term Goals - 01/14/17 1802      PT LONG TERM GOAL #1   Title patient to be independent with advanced HEP    Status On-going     PT LONG TERM GOAL #2   Title patient to improve lumbar AROM to [facility] in all planes without pain limiting   Status On-going     PT LONG TERM GOAL #3   Title patient to report ability to ambulate for >/= 30 min for cardiovascualr benefit without pain limiting   Status On-going     PT LONG TERM GOAL #4   Title patient to improve L LE strength to >/= 4+/5   Status On-going     PT LONG TERM GOAL #5   Title patient to report ability to perform household chores and ADLs without pain limiting   Status On-going     PT LONG TERM GOAL #6   Title patient to demonstrate cervical AROM WFL and symmetrical with no pain provocation/reproduction   Status On-going     PT LONG TERM GOAL #7   Title patient to report shoulder/arm pain no greater than 2/10 for greater than 2 weeks   Status On-going               Plan - 01/28/17 0939    Clinical Impression Statement Patient seen by MD this week - reports he has ordered MRI as well as recommended follow-up with Dr. Carloyn Manner.  Paitent able to perform all core, shoulder, and cervical work today with little increase in pain. Patient does note that STM in session does make paitent more comfortable in the home  environment, but short lasting.   PT Treatment/Interventions ADLs/Self Care Home Management;Cryotherapy;Electrical Stimulation;Iontophoresis 4mg /ml Dexamethasone;Moist Heat;Ultrasound;Neuromuscular re-education;Balance training;Therapeutic exercise;Therapeutic activities;Functional mobility training;Stair training;Gait training;Patient/family education;Manual techniques;Passive range of motion;Vasopneumatic Device;Taping;Dry needling   Consulted and Agree with Plan of Care Patient      Patient will benefit from skilled therapeutic intervention in order to improve the following deficits and impairments:  Decreased activity tolerance, Decreased range of motion, Decreased mobility, Decreased strength, Difficulty walking, Pain, Impaired UE functional use, Postural dysfunction  Visit Diagnosis: Chronic left-sided low back pain, with sciatica presence unspecified  Abnormal posture  Muscle weakness (generalized)  Difficulty in walking, not elsewhere classified  Cervicalgia  Acute pain of left shoulder     Problem List Patient Active Problem List   Diagnosis Date Noted  . Thyrotoxicosis without thyroid storm 01/18/2017  . Bilateral hearing loss 08/27/2016  . Nasal fracture 08/27/2016  . Knee injury 08/27/2016  . Head injury 08/27/2016  . Toxic multinodular goiter w/o crisis 06/22/2016  . Fatigue 05/05/2016  . Macular degeneration 03/09/2016  . Encounter for antineoplastic chemotherapy 12/19/2015  . Meralgia paresthetica of left side 07/10/2015  . Osteoporosis 07/10/2015  . Onychomycosis 07/10/2015  . Anxiety 07/10/2015  . Diabetes ([place]) 07/10/2015  . Cough 12/12/2013  . Pneumothorax after biopsy 07/09/2011    Class: Acute  . Primary adenocarcinoma of lower lobe of right lung ([place]) 06/24/2011  . Hypercholesterolemia 10/20/2010  . Essential hypertension, benign 10/20/2010  . Osteoarthritis 10/20/2010     Lanney Gins, PT, DPT 01/28/17 2:43 PM   [facility] [address]  [place], [place], [address] Phone: [phone]   Fax:  [phone]  Name: Samantha Quinn MRN: [id number] Date of Birth: 1927-10-25

## 2017-02-02 ENCOUNTER — Other Ambulatory Visit (HOSPITAL_COMMUNITY): Payer: Self-pay | Admitting: Orthopaedic Surgery

## 2017-02-02 ENCOUNTER — Ambulatory Visit: Payer: Medicare Other | Admitting: Physical Therapy

## 2017-02-02 DIAGNOSIS — R293 Abnormal posture: Secondary | ICD-10-CM

## 2017-02-02 DIAGNOSIS — M542 Cervicalgia: Secondary | ICD-10-CM

## 2017-02-02 DIAGNOSIS — M545 Low back pain: Secondary | ICD-10-CM | POA: Diagnosis not present

## 2017-02-02 DIAGNOSIS — R262 Difficulty in walking, not elsewhere classified: Secondary | ICD-10-CM

## 2017-02-02 DIAGNOSIS — M25512 Pain in left shoulder: Secondary | ICD-10-CM

## 2017-02-02 DIAGNOSIS — G8929 Other chronic pain: Secondary | ICD-10-CM

## 2017-02-02 DIAGNOSIS — M5412 Radiculopathy, cervical region: Secondary | ICD-10-CM

## 2017-02-02 DIAGNOSIS — M6281 Muscle weakness (generalized): Secondary | ICD-10-CM

## 2017-02-02 NOTE — Therapy (Signed)
[facility] [address]  [place], [place], [address] Phone: [phone]   Fax:  [phone]  Physical Therapy Treatment  Patient Details  Name: Samantha Quinn MRN: [id number] Date of Birth: 06-22-1927 Referring Provider: Dr. Melrose Nakayama  Encounter Date: 02/02/2017      PT End of Session - 02/02/17 1500    Visit Number 15   Number of Visits 20   Date for PT Re-Evaluation 02/25/17   Authorization Type Medicare   PT Start Time 3810   PT Stop Time 1445  moist heat   PT Time Calculation (min) 53 min   Activity Tolerance Patient tolerated treatment well   Behavior During Therapy [facility] for tasks assessed/performed      Past Medical History:  Diagnosis Date  . Back pain    bulding disc  . Bronchitis    hx of-many,many years   . Diabetes mellitus   . Diabetes mellitus    type 2 and takes Metformin bid  . Dyslipidemia   . Embolism - blood clot    hx of in left lower leg and was on Coumadin for about 76months;this was about 8-67yrs ago  . Family history of adverse reaction to anesthesia    Daughter- Nausea  . History of colonic polyps   . HOH (hard of hearing)   . Hx of migraines    as a teenager  . Hypercholesterolemia   . Hyperlipidemia    takes Lovastatin every other day  . Hypertension    takes Ziac daily and Ramipril as well  . Hypothyroidism    takes Synthroid every other day  . Insomnia    takes Elavil nightly  . Joint pain   . Lung mass   . Myalgia   . Osteoarthritis   . Osteoporosis   . Pneumothorax of right lung after biopsy 07/09/11   HAD CHEST TUBE PLACEMENT  . PONV (postoperative nausea and vomiting)   . Primary adenocarcinoma of lower lobe of right lung ([place]) 06/24/2011   pT2a, pN0 M0 Stage 1 Well differenced Adenocarcinoma 3.5 cm resected 07/17/2011  . Primary adenocarcinoma of lung ([place]) 06/24/2011  . Shortness of breath dyspnea    With exertion  . Skin cancer    legs have dark spots  on them  . Upper respiratory infection 04/2011  . Urinary incontinence    takes Detrol daily prn    Past Surgical History:  Procedure Laterality Date  . ABDOMINAL HYSTERECTOMY    . APPENDECTOMY     as a child  . CHEST TUBE REMOVAL  07/13/11   RIGHT  CHEST TUBE  . CHOLECYSTECTOMY    . COLONOSCOPY    . COLONOSCOPY    . DILATION AND CURETTAGE OF UTERUS     x 2  . EYE SURGERY Bilateral    Cataract  . HEMORRHOID SURGERY    . LUNG BIOPSY  07/09/11   RIGHT UPER LOBE LUNG MASS   . RIGHT CHEST TUBE PLACEMENT  07/10/11   S/P LUNG BX  . ROTATOR CUFF REPAIR     bilateral  . TOE SURGERY  2013   right small toe  . TONSILLECTOMY     as a child  . VIDEO BRONCHOSCOPY  07/17/2011   Procedure: VIDEO BRONCHOSCOPY;  Surgeon: Grace Isaac, MD;  Location: [facility];  Service: Thoracic;  Laterality: N/A;  . VIDEO BRONCHOSCOPY WITH ENDOBRONCHIAL ULTRASOUND N/A 12/10/2015   Procedure: VIDEO BRONCHOSCOPY WITH ENDOBRONCHIAL ULTRASOUND with biopsies.;  Surgeon: Grace Isaac, MD;  Location: [facility];  Service: Thoracic;  Laterality: N/A;    There were no vitals filed for this visit.      Subjective Assessment - 02/02/17 1355    Subjective MRI scheduled for Saturday; continues to take tylenol for pain relief   Pertinent History lung cancer, DM 2, HTN, osteoporosis   Diagnostic tests Xray and MRI: "ruptured disc" per patient   Patient Stated Goals improve pain and function   Currently in Pain? Yes   Pain Score 4    Pain Location Shoulder   Pain Orientation Left   Pain Descriptors / Indicators Aching;Sore   Pain Type Acute pain                         [facility] Adult PT Treatment/Exercise - 02/02/17 0001      Neck Exercises: Supine   Cervical Isometrics Flexion;Right lateral flexion;Left lateral flexion   Cervical Isometrics Limitations slight pain with flexion; increase in N&T with R LF     Knee/Hip Exercises: Aerobic   Nustep L5 x 6 min (B LE/RUE)     Moist Heat Therapy    Number Minutes Moist Heat 15 Minutes   Moist Heat Location Shoulder     Manual Therapy   Manual Therapy Joint mobilization;Soft tissue mobilization;Passive ROM;Manual Traction   Manual therapy comments patient supine   Joint Mobilization very gentle CPAs to C-spine - increase in N&T; Gentle AP/PA to L shoulder - "feels good"   Soft tissue mobilization STM to B cervical paraspinals, L UT, L LS, L deltoid region   Passive ROM PROM into R LF for gentle L UT stretch   Manual Traction manaul traction 30 sec pull/30 sec relax x 6 minutes                  PT Short Term Goals - 01/14/17 1802      PT SHORT TERM GOAL #1   Title patient to be independent with initial HEP for lumbar AROM and stretching    Status Achieved           PT Long Term Goals - 01/14/17 1802      PT LONG TERM GOAL #1   Title patient to be independent with advanced HEP    Status On-going     PT LONG TERM GOAL #2   Title patient to improve lumbar AROM to [facility] in all planes without pain limiting   Status On-going     PT LONG TERM GOAL #3   Title patient to report ability to ambulate for >/= 30 min for cardiovascualr benefit without pain limiting   Status On-going     PT LONG TERM GOAL #4   Title patient to improve L LE strength to >/= 4+/5   Status On-going     PT LONG TERM GOAL #5   Title patient to report ability to perform household chores and ADLs without pain limiting   Status On-going     PT LONG TERM GOAL #6   Title patient to demonstrate cervical AROM WFL and symmetrical with no pain provocation/reproduction   Status On-going     PT LONG TERM GOAL #7   Title patient to report shoulder/arm pain no greater than 2/10 for greater than 2 weeks   Status On-going               Plan - 02/02/17 1501    Clinical Impression Statement Patient with  MRI scheduled for Saturday. Patient reporting continued pain and N&T with only Tylenol helping relieve pain at this point. Patient performing  cervical isometrics at last session with no issue, however today with slight pain and icreased N&T into L UE. Patient with relief in symptoms with head propped into slight cervical flexion and with forward gaze. Will continue to progress as able.    PT Treatment/Interventions ADLs/Self Care Home Management;Cryotherapy;Electrical Stimulation;Iontophoresis 4mg /ml Dexamethasone;Moist Heat;Ultrasound;Neuromuscular re-education;Balance training;Therapeutic exercise;Therapeutic activities;Functional mobility training;Stair training;Gait training;Patient/family education;Manual techniques;Passive range of motion;Vasopneumatic Device;Taping;Dry needling   Consulted and Agree with Plan of Care Patient      Patient will benefit from skilled therapeutic intervention in order to improve the following deficits and impairments:  Decreased activity tolerance, Decreased range of motion, Decreased mobility, Decreased strength, Difficulty walking, Pain, Impaired UE functional use, Postural dysfunction  Visit Diagnosis: Chronic left-sided low back pain, with sciatica presence unspecified  Abnormal posture  Muscle weakness (generalized)  Difficulty in walking, not elsewhere classified  Cervicalgia  Acute pain of left shoulder     Problem List Patient Active Problem List   Diagnosis Date Noted  . Thyrotoxicosis without thyroid storm 01/18/2017  . Bilateral hearing loss 08/27/2016  . Nasal fracture 08/27/2016  . Knee injury 08/27/2016  . Head injury 08/27/2016  . Toxic multinodular goiter w/o crisis 06/22/2016  . Fatigue 05/05/2016  . Macular degeneration 03/09/2016  . Encounter for antineoplastic chemotherapy 12/19/2015  . Meralgia paresthetica of left side 07/10/2015  . Osteoporosis 07/10/2015  . Onychomycosis 07/10/2015  . Anxiety 07/10/2015  . Diabetes ([place]) 07/10/2015  . Cough 12/12/2013  . Pneumothorax after biopsy 07/09/2011    Class: Acute  . Primary adenocarcinoma of lower lobe of  right lung ([place]) 06/24/2011  . Hypercholesterolemia 10/20/2010  . Essential hypertension, benign 10/20/2010  . Osteoarthritis 10/20/2010      Lanney Gins, PT, DPT 02/02/17 3:03 PM   [facility] [address]  [place], [place], [address] Phone: [phone]   Fax:  [phone]  Name: Samantha Quinn MRN: [id number] Date of Birth: Nov 17, 1927

## 2017-02-04 ENCOUNTER — Ambulatory Visit (HOSPITAL_COMMUNITY)
Admission: RE | Admit: 2017-02-04 | Discharge: 2017-02-04 | Disposition: A | Discharge status: Home / Self Care | Payer: Medicare Other | Source: Ambulatory Visit | Attending: Orthopaedic Surgery | Admitting: Orthopaedic Surgery

## 2017-02-04 ENCOUNTER — Ambulatory Visit: Payer: Medicare Other | Admitting: Physical Therapy

## 2017-02-04 DIAGNOSIS — M4802 Spinal stenosis, cervical region: Secondary | ICD-10-CM | POA: Insufficient documentation

## 2017-02-04 DIAGNOSIS — M542 Cervicalgia: Secondary | ICD-10-CM | POA: Diagnosis not present

## 2017-02-04 DIAGNOSIS — M5412 Radiculopathy, cervical region: Secondary | ICD-10-CM | POA: Diagnosis not present

## 2017-02-04 DIAGNOSIS — M8448XA Pathological fracture, other site, initial encounter for fracture: Secondary | ICD-10-CM | POA: Diagnosis not present

## 2017-02-04 DIAGNOSIS — C7951 Secondary malignant neoplasm of bone: Secondary | ICD-10-CM | POA: Diagnosis not present

## 2017-02-04 DIAGNOSIS — M899 Disorder of bone, unspecified: Secondary | ICD-10-CM | POA: Diagnosis not present

## 2017-02-05 ENCOUNTER — Ambulatory Visit (HOSPITAL_BASED_OUTPATIENT_CLINIC_OR_DEPARTMENT_OTHER): Payer: Medicare Other | Admitting: Hematology & Oncology

## 2017-02-05 ENCOUNTER — Ambulatory Visit (HOSPITAL_BASED_OUTPATIENT_CLINIC_OR_DEPARTMENT_OTHER): Payer: Medicare Other

## 2017-02-05 ENCOUNTER — Encounter: Payer: Self-pay | Admitting: Hematology & Oncology

## 2017-02-05 VITALS — BP 149/60 | HR 77 | Temp 98.2°F | Resp 16 | Wt 129.0 lb

## 2017-02-05 DIAGNOSIS — G893 Neoplasm related pain (acute) (chronic): Secondary | ICD-10-CM

## 2017-02-05 DIAGNOSIS — C7951 Secondary malignant neoplasm of bone: Secondary | ICD-10-CM

## 2017-02-05 DIAGNOSIS — C3431 Malignant neoplasm of lower lobe, right bronchus or lung: Secondary | ICD-10-CM

## 2017-02-05 DIAGNOSIS — C3490 Malignant neoplasm of unspecified part of unspecified bronchus or lung: Secondary | ICD-10-CM

## 2017-02-05 MED ORDER — KETOROLAC TROMETHAMINE 15 MG/ML IJ SOLN
15.0000 mg | Freq: Once | INTRAMUSCULAR | Status: AC
  Administered 2017-02-05: 15 mg via INTRAVENOUS

## 2017-02-05 MED ORDER — SODIUM CHLORIDE 0.9 % IV SOLN
20.0000 mg | Freq: Once | INTRAVENOUS | Status: AC
  Administered 2017-02-05: 20 mg via INTRAVENOUS
  Filled 2017-02-05: qty 2

## 2017-02-05 MED ORDER — FAMOTIDINE IN NACL 20-0.9 MG/50ML-% IV SOLN
INTRAVENOUS | Status: AC
  Filled 2017-02-05: qty 100

## 2017-02-05 MED ORDER — PANTOPRAZOLE SODIUM 40 MG PO TBEC: 40.0000 mg | DELAYED_RELEASE_TABLET | Freq: Every day | ORAL | 4 refills | Status: DC

## 2017-02-05 MED ORDER — DEXAMETHASONE 4 MG PO TABS: ORAL_TABLET | ORAL | 2 refills | Status: DC

## 2017-02-05 MED ORDER — ZOLEDRONIC ACID 4 MG/5ML IV CONC
3.0000 mg | Freq: Once | INTRAVENOUS | Status: AC
  Administered 2017-02-05: 3 mg via INTRAVENOUS
  Filled 2017-02-05: qty 3.75

## 2017-02-05 MED ORDER — KETOROLAC TROMETHAMINE 15 MG/ML IJ SOLN
INTRAMUSCULAR | Status: AC
  Filled 2017-02-05: qty 1

## 2017-02-05 MED ORDER — FAMOTIDINE IN NACL 20-0.9 MG/50ML-% IV SOLN
40.0000 mg | Freq: Two times a day (BID) | INTRAVENOUS | Status: DC
Start: 2017-02-05 — End: 2017-02-05
  Administered 2017-02-05: 40 mg via INTRAVENOUS

## 2017-02-05 MED FILL — DEXAMETHASONE 4 MG TABLET: 4 | 15 days supply | Qty: 90 | Fill #0

## 2017-02-05 MED FILL — PANTOPRAZOLE SOD DR 40 MG T: 40 | 30 days supply | Qty: 30 | Fill #0

## 2017-02-05 NOTE — Progress Notes (Signed)
Dictation #1 [id number]  [id number]:[id number]  Hematology and Oncology Follow Up Visit  Samantha Quinn [id number] 21-Jun-1927 83 y.o. 02/05/2017   Principle Diagnosis:   Stage IV adenocarcinoma of the right lung-mutation negative  C6-7 left nerve root compression secondary to metastatic disease  Current Therapy:    Tecentriq 1200 mg IV every 3 weeks s/p cycle #8  Status post radiation/chemotherapy for recurrence-completed October 2017  Status post right upper lobectomy in March 2013 for stage IB disease     Interim History:  Ms. Dang is back for an unscheduled visit. I got called by the orthopedic surgeon's office. They saw Samantha Quinn. They went ahead and did a MRI on her. Shockingly enough, MRI showed that she had tumor at C7 that was invading the C6-7 left neural foramen. This probably is what is causing her discomfort. There appear to be a pathologic fracture through the lamina/base of the spinous processes. Small metastasis was noted also at C6 and T3.  I spoke with Dr. Phylliss Bob of orthopedic surgery. He will see her tomorrow and likely set her up for surgery for removal of the tumor and stabilization of her cervical spine. She will need radiation afterwards.  I spoke with Samantha Quinn and her daughter about this. I'm surprised by this as the CT scans that we have done all her have never shown anything with her cervical spine. Possibly, the level of activity was a little bit more cephalad says that the CT did not get up.  She has no weakness. She just has tingling in the left shoulder and arm.  She had been on Tecentriq which I thought that she was responding to.  I think it is clear that given the results of this MRI, that we now have aggressive disease. As such, further Tecentriq really is not indicated. I am not sure what else we will really be able to use. 1 positive result coming from this situation is that we should have more than enough tumor to be able to run appropriate  genetic studies. We can see what her TMB is.  Otherwise, Samantha Quinn is holding her own. She is eating okay. She having no nausea or vomiting. She having a little bit of a cough. This is nonproductive.   Overall, her performance status is ECOG 1-2   Medications:  Current Outpatient Prescriptions:  .  amitriptyline (ELAVIL) 10 MG tablet, Take 1 tablet (10 mg total) by mouth at bedtime. (Patient taking differently: Take 5 mg by mouth at bedtime. ), Disp: 90 tablet, Rfl: 1 .  bisoprolol-hydrochlorothiazide (ZIAC) 2.5-6.25 MG tablet, TAKE 1 TABLET DAILY, Disp: 90 tablet, Rfl: 3 .  Blood Glucose Monitoring Suppl (ONE TOUCH ULTRA 2) w/Device KIT, Use to check blood sugars twice a day Dx E11.9, Disp: 1 each, Rfl: 0 .  calcium carbonate (OS-CAL) 600 MG tablet, Take 600 mg by mouth daily., Disp: , Rfl:  .  gabapentin (NEURONTIN) 100 MG capsule, Take 100 mg by mouth 3 (three) times daily. , Disp: , Rfl:  .  glucose blood (ONE TOUCH ULTRA TEST) test strip, 1 each by Other route 2 (two) times daily. Use to check blood sugars twice a day, Disp: 100 each, Rfl: 3 .  HYDROcodone-homatropine (HYCODAN) 5-1.5 MG/5ML syrup, Take 5 mLs by mouth every 6 (six) hours as needed for cough. (Patient taking differently: Take 2.5 mLs by mouth every 6 (six) hours as needed for cough. ), Disp: 180 mL, Rfl: 0 .  ibuprofen (ADVIL,MOTRIN) 200 MG tablet,  Take 200 mg by mouth every 6 (six) hours as needed for fever, headache or mild pain. , Disp: , Rfl:  .  Lancets (ONETOUCH ULTRASOFT) lancets, 1 each by Other route 2 (two) times daily. Use to help check blood sugars twice a day, Disp: 100 each, Rfl: 3 .  lovastatin (MEVACOR) 20 MG tablet, TAKE 1 TABLET BY MOUTH EVERY OTHER DAY, Disp: 90 tablet, Rfl: 1 .  metFORMIN (GLUCOPHAGE) 500 MG tablet, TAKE 1 TABLET 2 TIMES DAILY, Disp: 180 tablet, Rfl: 1 .  methimazole (TAPAZOLE) 5 MG tablet, Take 0.5 tablets (2.5 mg total) by mouth daily. (Patient taking differently: Take 2.5 mg by mouth.  Take one every other day.), Disp: 30 tablet, Rfl: 3 .  Multiple Vitamins-Minerals (VISION-VITE PRESERVE PO), Take by mouth 2 (two) times daily., Disp: , Rfl:   Allergies:  Allergies  Allergen Reactions  . Codeine Nausea And Vomiting  . Tramadol Other (See Comments)    syncope  . Demerol Nausea Only    Past Medical History, Surgical history, Social history, and Family History were reviewed and updated.  Review of Systems: As stated in the interim history  Physical Exam:  weight is 129 lb (58.5 kg). Her oral temperature is 98.2 F (36.8 C). Her blood pressure is 149/60 (abnormal) and her pulse is 77. Her respiration is 16 and oxygen saturation is 99%.   Wt Readings from Last 3 Encounters:  02/05/17 129 lb (58.5 kg)  01/20/17 128 lb (58.1 kg)  01/18/17 129 lb (58.5 kg)     Physical Exam  Constitutional: She is oriented to person, place, and time.  HENT:  Head: Normocephalic and atraumatic.  Mouth/Throat: Oropharynx is clear and moist.  Eyes: Pupils are equal, round, and reactive to light. EOM are normal.  Neck: Normal range of motion.  Cardiovascular: Normal rate, regular rhythm and normal heart sounds.   Pulmonary/Chest: Effort normal and breath sounds normal.  Abdominal: Soft. Bowel sounds are normal.  Musculoskeletal: Normal range of motion. She exhibits no edema, tenderness or deformity.  She does have tenderness in the posterior left shoulder. She has moderate decreased range of motion of the left shoulder. She has partial abduction of the left arm.  Lymphadenopathy:    She has no cervical adenopathy.  Neurological: She is alert and oriented to person, place, and time.  Skin: Skin is warm and dry. No rash noted. No erythema.  Psychiatric: She has a normal mood and affect. Her behavior is normal. Judgment and thought content normal.  Vitals reviewed.    Lab Results  Component Value Date   WBC 8.1 01/20/2017   HGB 13.2 01/20/2017   HCT 39.9 01/20/2017   MCV 86  01/20/2017   PLT 208 01/20/2017     Chemistry      Component Value Date/Time   NA 138 01/20/2017 1035   NA 139 06/08/2016 1316   K 3.7 01/20/2017 1035   K 3.7 06/08/2016 1316   CL 98 01/20/2017 1035   CO2 29 01/20/2017 1035   CO2 24 06/08/2016 1316   BUN 15 01/20/2017 1035   BUN 13.3 06/08/2016 1316   CREATININE 0.9 01/20/2017 1035   CREATININE 0.7 06/08/2016 1316      Component Value Date/Time   CALCIUM 9.4 01/20/2017 1035   CALCIUM 9.8 06/08/2016 1316   ALKPHOS 88 (H) 01/20/2017 1035   ALKPHOS 79 06/08/2016 1316   AST 23 01/20/2017 1035   AST 14 06/08/2016 1316   ALT 20 01/20/2017 1035  ALT 11 06/08/2016 1316   BILITOT 0.80 01/20/2017 1035   BILITOT 0.25 06/08/2016 1316         Impression and Plan: Ms. Quinn is a 83 year-old white female. She has Progressive non-small cell lung cancer. This is adenocarcinoma.  We will treat her in the office today with some IV Decadron. I will give her a dose of 20 mg. I will also give her a dose of Zometa. She will get 4 mg of Zometa  I will give her some Pepcid and also some Toradol to help with some pain.  Hopefully, she will be having surgery next week.  I spent about 45-50 minutes with she and her daughter. I went over the x-rays with her.  I do feel bad that she is doing with this now. Thankfully, she has a lot of support from her family to help.  [name], MD 10/12/20181:32 PM

## 2017-02-05 NOTE — Patient Instructions (Signed)

## 2017-02-06 ENCOUNTER — Inpatient Hospital Stay
Admission: RE | Admit: 2017-02-06 | Discharge: 2017-02-06 | Disposition: A | Discharge status: Home / Self Care | Payer: Medicare Other | Source: Ambulatory Visit | Attending: Orthopaedic Surgery | Admitting: Orthopaedic Surgery

## 2017-02-06 DIAGNOSIS — M542 Cervicalgia: Secondary | ICD-10-CM | POA: Diagnosis not present

## 2017-02-08 ENCOUNTER — Other Ambulatory Visit: Payer: Self-pay | Admitting: Orthopedic Surgery

## 2017-02-08 ENCOUNTER — Telehealth: Payer: Self-pay | Admitting: *Deleted

## 2017-02-08 ENCOUNTER — Telehealth: Payer: Self-pay | Admitting: Internal Medicine

## 2017-02-08 NOTE — Telephone Encounter (Signed)
How high are her sugars?   Increase to 1000 mg twice daily.  If sugars do not decrease she should call back and we will add a second medication.

## 2017-02-08 NOTE — Telephone Encounter (Signed)
noted 

## 2017-02-08 NOTE — Telephone Encounter (Signed)
Pt called stating that she is being treated for cancer and was given Dexamethazone for the pain she is having. This caused her BS to go up. She is wanting to know if she needs to increase her metformin or what she should do? Please advise.

## 2017-02-08 NOTE — Telephone Encounter (Signed)
Spoke with pt to inform. States sugars were 173 fasting this am. Typically run in the 130-140 range. Pt is having surgery next week to remove a tumor.

## 2017-02-08 NOTE — Telephone Encounter (Signed)
Patient has been prescribed decadron 8mg  TID for metastatic disease. Patient is noticing that her blood sugar readings have increased. She would like to know what to do. Dr Quay Burow manages her diabetes.  Patient instructed to notify Dr Quay Burow office of new prescription, and to inquire whether or not her should alter her oral diabetic management while on steroids. Patient understood and will contact that office.

## 2017-02-09 ENCOUNTER — Ambulatory Visit: Payer: Medicare Other | Admitting: Physical Therapy

## 2017-02-09 DIAGNOSIS — M6281 Muscle weakness (generalized): Secondary | ICD-10-CM

## 2017-02-09 DIAGNOSIS — M542 Cervicalgia: Secondary | ICD-10-CM | POA: Diagnosis not present

## 2017-02-09 DIAGNOSIS — R293 Abnormal posture: Secondary | ICD-10-CM | POA: Diagnosis not present

## 2017-02-09 DIAGNOSIS — M25512 Pain in left shoulder: Secondary | ICD-10-CM | POA: Diagnosis not present

## 2017-02-09 DIAGNOSIS — M545 Low back pain: Principal | ICD-10-CM

## 2017-02-09 DIAGNOSIS — R262 Difficulty in walking, not elsewhere classified: Secondary | ICD-10-CM

## 2017-02-09 DIAGNOSIS — G8929 Other chronic pain: Secondary | ICD-10-CM

## 2017-02-09 NOTE — Therapy (Signed)
[facility] [address]  [place], [place], [address] Phone: [phone]   Fax:  [phone]  Physical Therapy Treatment  Patient Details  Name: Samantha Quinn MRN: [id number] Date of Birth: [birth year] Referring Provider: Dr. Melrose Nakayama  Encounter Date: 02/09/2017      PT End of Session - 02/09/17 1012    Visit Number 16   Number of Visits 20   Date for PT Re-Evaluation 02/25/17   Authorization Type Medicare   PT Start Time 0926   PT Stop Time 1011   PT Time Calculation (min) 45 min   Activity Tolerance Patient tolerated treatment well   Behavior During Therapy [facility] for tasks assessed/performed      Past Medical History:  Diagnosis Date  . Back pain    bulding disc  . Bronchitis    hx of-many,many years   . Diabetes mellitus   . Diabetes mellitus    type 2 and takes Metformin bid  . Dyslipidemia   . Embolism - blood clot    hx of in left lower leg and was on Coumadin for about 51month;this was about 8-114yrago  . Family history of adverse reaction to anesthesia    Daughter- Nausea  . History of colonic polyps   . HOH (hard of hearing)   . Hx of migraines    as a teenager  . Hypercholesterolemia   . Hyperlipidemia    takes Lovastatin every other day  . Hypertension    takes Ziac daily and Ramipril as well  . Hypothyroidism    takes Synthroid every other day  . Insomnia    takes Elavil nightly  . Joint pain   . Lung mass   . Myalgia   . Osteoarthritis   . Osteoporosis   . Pneumothorax of right lung after biopsy 07/09/11   HAD CHEST TUBE PLACEMENT  . PONV (postoperative nausea and vomiting)   . Primary [facility]   pT2a, pN0 M0 Stage 1 Well differenced Adenocarcinoma 3.5 cm resected 07/17/2011  . Primary adenocarcinoma of lung ([facility]/2013  . Shortness of breath dyspnea    With exertion  . Skin cancer    legs have dark spots on them  .  Upper respiratory infection 04/2011  . Urinary incontinence    takes Detrol daily prn    Past Surgical History:  Procedure Laterality Date  . ABDOMINAL HYSTERECTOMY    . APPENDECTOMY     as a child  . CHEST TUBE REMOVAL  07/13/11   RIGHT  CHEST TUBE  . CHOLECYSTECTOMY    . COLONOSCOPY    . COLONOSCOPY    . DILATION AND CURETTAGE OF UTERUS     x 2  . EYE SURGERY Bilateral    Cataract  . HEMORRHOID SURGERY    . LUNG BIOPSY  07/09/11   RIGHT UPER LOBE LUNG MASS   . RIGHT CHEST TUBE PLACEMENT  07/10/11   S/P LUNG BX  . ROTATOR CUFF REPAIR     bilateral  . TOE SURGERY  2013   right small toe  . TONSILLECTOMY     as a child  . VIDEO BRONCHOSCOPY  07/17/2011   Procedure: VIDEO BRONCHOSCOPY;  Surgeon: EdGrace IsaacMD;  [facility] Service: Thoracic;  Laterality: N/A;  . VIDEO BRONCHOSCOPY WITH ENDOBRONCHIAL ULTRASOUND N/A 12/10/2015   Procedure: VIDEO BRONCHOSCOPY WITH ENDOBRONCHIAL ULTRASOUND with biopsies.;  Surgeon: EdPercell Miller  Maryruth Bun, MD;  Location: [facility] OR;  Service: Thoracic;  Laterality: N/A;    There were no vitals filed for this visit.      Subjective Assessment - 02/09/17 0933    Subjective recieved MRI results - tumor on/near spinal cord   Pertinent History lung cancer, DM 2, HTN, osteoporosis   Patient Stated Goals improve pain and function   Currently in Pain? No/denies   Pain Score 0-No pain                         [facility] Adult PT Treatment/Exercise - 02/09/17 0001      Lumbar Exercises: Stretches   Single Knee to Chest Stretch 2 reps;30 seconds   Single Knee to Chest Stretch Limitations bilateral   Lower Trunk Rotation Limitations 10 x 10 seconds     Lumbar Exercises: Aerobic   Stationary Bike NuStep: L5 x 6 min (B LE/R UE)      Lumbar Exercises: Supine   Clam 20 reps   Clam Limitations B: green tband   Bridge 20 reps   Bridge Limitations green tband at knees   Straight Leg Raise 15 reps   Straight Leg Raises Limitations B: 2#                   PT Short Term Goals - 01/14/17 1802      PT SHORT TERM GOAL #1   Title patient to be independent with initial HEP for lumbar AROM and stretching    Status Achieved           PT Long Term Goals - 02/09/17 1156      PT LONG TERM GOAL #1   Title patient to be independent with advanced HEP    Status Achieved     PT LONG TERM GOAL #2   Title patient to improve lumbar AROM to [facility] in all planes without pain limiting   Status Partially Met     PT LONG TERM GOAL #3   Title patient to report ability to ambulate for >/= 30 min for cardiovascualr benefit without pain limiting   Status Not Met     PT LONG TERM GOAL #4   Title patient to improve L LE strength to >/= 4+/5   Status Partially Met     PT LONG TERM GOAL #5   Title patient to report ability to perform household chores and ADLs without pain limiting   Status Achieved     PT LONG TERM GOAL #6   Title patient to demonstrate cervical AROM WFL and symmetrical with no pain provocation/reproduction   Status Not Met     PT LONG TERM GOAL #7   Title patient to report shoulder/arm pain no greater than 2/10 for greater than 2 weeks   Status Not Met               Plan - 02/09/17 1156    Clinical Impression Statement Patient receiving MRI result last week with less than optimal results - noted tumor at approx C7. Patient scheduled for surgery next week with today being last day of PT to allow for patient to prepare. PT session today only incorporating some low back stretches as well as hip strengthening with good patient tolerance. PT goals not met at this time, likely due to change in medical status. Will plan to d/c from PT today.    PT Treatment/Interventions ADLs/Self Care Home Management;Cryotherapy;Electrical Stimulation;Iontophoresis 73m/ml Dexamethasone;Moist Heat;Ultrasound;Neuromuscular re-education;Balance  training;Therapeutic exercise;Therapeutic activities;Functional mobility  training;Stair training;Gait training;Patient/family education;Manual techniques;Passive range of motion;Vasopneumatic Device;Taping;Dry needling   Consulted and Agree with Plan of Care Patient      Patient will benefit from skilled therapeutic intervention in order to improve the following deficits and impairments:  Decreased activity tolerance, Decreased range of motion, Decreased mobility, Decreased strength, Difficulty walking, Pain, Impaired UE functional use, Postural dysfunction  Visit Diagnosis: Chronic left-sided low back pain, with sciatica presence unspecified  Abnormal posture  Muscle weakness (generalized)  Difficulty in walking, not elsewhere classified  Cervicalgia  Acute pain of left shoulder       G-Codes - Feb 25, 2017 1159    Functional Assessment Tool Used (Outpatient Only) FOTO: 55 (45% limited)   Functional Limitation Mobility: Walking and moving around   Mobility: Walking and Moving Around Goal Status [phone]) At least 40 percent but less than 60 percent impaired, limited or restricted   Mobility: Walking and Moving Around Discharge Status [phone]) At least 40 percent but less than 60 percent impaired, limited or restricted      Problem List Patient Active Problem List   Diagnosis Date Noted  . Lung cancer metastatic to bone ([place]) 02/05/2017  . Thyrotoxicosis without thyroid storm 01/18/2017  . Bilateral hearing loss 08/27/2016  . Nasal fracture 08/27/2016  . Knee injury 08/27/2016  . Head injury 08/27/2016  . Toxic multinodular goiter w/o crisis 06/22/2016  . Fatigue 05/05/2016  . Macular degeneration 03/09/2016  . Encounter for antineoplastic chemotherapy 12/19/2015  . Meralgia paresthetica of left side 07/10/2015  . Osteoporosis 07/10/2015  . Onychomycosis 07/10/2015  . Anxiety 07/10/2015  . Diabetes ([place]) 07/10/2015  . Cough 12/12/2013  . Pneumothorax after biopsy 07/09/2011    Class: Acute  . Primary adenocarcinoma of lower lobe of right lung  ([place]) 06/24/2011  . Hypercholesterolemia 10/20/2010  . Essential hypertension, benign 10/20/2010  . Osteoarthritis 10/20/2010    Lanney Gins, PT, DPT 2017/02/25 12:01 PM   PHYSICAL THERAPY DISCHARGE SUMMARY  Visits from Start of Care: 16  Current functional level related to goals / functional outcomes: See above   Remaining deficits: See above   Education / Equipment: HEP  Plan: Patient agrees to discharge.  Patient goals were partially met. Patient is being discharged due to a change in medical status.  ?????    Lanney Gins, PT, DPT 02/25/2017 12:01 PM   [facility] [address]  [place], [place], [address] Phone: [phone]   Fax:  [phone]  Name: DOYLE TEGETHOFF MRN: [id number] Date of Birth: 09/09/1927

## 2017-02-10 ENCOUNTER — Other Ambulatory Visit: Payer: Medicare Other

## 2017-02-10 ENCOUNTER — Other Ambulatory Visit: Payer: Self-pay | Admitting: Orthopedic Surgery

## 2017-02-10 DIAGNOSIS — M542 Cervicalgia: Secondary | ICD-10-CM

## 2017-02-11 ENCOUNTER — Ambulatory Visit: Payer: Medicare Other | Admitting: Physical Therapy

## 2017-02-11 ENCOUNTER — Other Ambulatory Visit: Payer: Self-pay | Admitting: Internal Medicine

## 2017-02-11 NOTE — Pre-Procedure Instructions (Addendum)
Samantha Quinn  02/11/2017      [facility] - [place], [place] - [address] [place] [address] Phone: [phone] Fax: [phone]  [id number] [place], [place] [address] Phone: [phone] Fax: [place], [place] [address] [place] [address] [place] [address] Phone: [phone] Fax: [phone]    Your procedure is scheduled on Wednesday, 02/17/2017.  Report to [facility] Admitting at 0900 A.M.  Call this number if you have problems the morning of surgery:  [phone]   Remember:  Do not eat food or drink liquids after midnight.   Continue all other medications as directed by your physician except follow these medication instructions before surgery   Take these medicines the morning of surgery with A SIP OF WATER: Dexamethasone (Decadron) Gabapentin (Neurontin) Methimazole (Tapazole) Pantoprazole (Protonix)  7 days prior to surgery STOP taking any Aspirin (unless otherwise instructed by your surgeon), Aleve, Naproxen, Ibuprofen, Motrin, Advil, Goody's, BC's, all herbal medications, fish oil, and all vitamins    WHAT DO I DO ABOUT MY DIABETES MEDICATION?  [name] Do not take oral diabetes medicines (pills) the morning of surgery.    How to Manage Your Diabetes Before and After Surgery  Why is it important to control my blood sugar before and after surgery? . Improving blood sugar levels before and after surgery helps healing and can limit problems. . A way of improving blood sugar control is eating a healthy diet by: o  Eating less sugar and carbohydrates o  Increasing activity/exercise o  Talking with your doctor about reaching your blood sugar goals . High blood sugars (greater than 180 mg/dL) can raise your risk of infections and slow your recovery, so you  will need to focus on controlling your diabetes during the weeks before surgery. . Make sure that the doctor who takes care of your diabetes knows about your planned surgery including the date and location.  How do I manage my blood sugar before surgery? . Check your blood sugar at least 4 times a day, starting 2 days before surgery, to make sure that the level is not too high or low. o Check your blood sugar the morning of your surgery when you wake up and every 2 hours until you get to the Short Stay unit. . If your blood sugar is less than 70 mg/dL, you will need to treat for low blood sugar: o Do not take insulin. o Treat a low blood sugar (less than 70 mg/dL) with  cup of clear juice (cranberry or apple), 4 glucose tablets, OR glucose gel. o Recheck blood sugar in 15 minutes after treatment (to make sure it is greater than 70 mg/dL). If your blood sugar is not greater than 70 mg/dL on recheck, call [phone] for further instructions. . Report your blood sugar to the short stay nurse when you get to Short Stay.  . If you are admitted to the hospital after surgery: o Your blood sugar will be checked by the staff and you will probably be given insulin after surgery (instead of oral diabetes medicines) to make sure you have good blood sugar levels. o The goal for blood sugar control after surgery is 80-180 mg/dL.     Do not wear jewelry, make-up or nail polish.  Do not wear lotions, powders, or  perfumes, or deoderant.  Do not shave 48 hours prior to surgery.  Men may shave face and neck.  Do not bring valuables to the hospital.  [facility] is not responsible for any belongings or valuables.  Contacts, eyeglasses, dentures or bridgework may not be worn into surgery.  Leave your suitcase in the car.  After surgery it may be brought to your room.  For patients admitted to the hospital, discharge time will be determined by your treatment team.  Patients discharged the day of surgery  will not be allowed to drive home.   Name and phone number of your driver:    Special instructions:   [place]- Preparing For Surgery  Before surgery, you can play an important role. Because skin is not sterile, your skin needs to be as free of germs as possible. You can reduce the number of germs on your skin by washing with CHG (chlorahexidine gluconate) Soap before surgery.  CHG is an antiseptic cleaner which kills germs and bonds with the skin to continue killing germs even after washing.  Please do not use if you have an allergy to CHG or antibacterial soaps. If your skin becomes reddened/irritated stop using the CHG.  Do not shave (including legs and underarms) for at least 48 hours prior to first CHG shower. It is OK to shave your face.  Please follow these instructions carefully.   1. Shower the NIGHT BEFORE SURGERY and the MORNING OF SURGERY with CHG.   2. If you chose to wash your hair, wash your hair first as usual with your normal shampoo.  3. After you shampoo, rinse your hair and body thoroughly to remove the shampoo.  4. Use CHG as you would any other liquid soap. You can apply CHG directly to the skin and wash gently with a scrungie or a clean washcloth.   5. Apply the CHG Soap to your body ONLY FROM THE NECK DOWN.  Do not use on open wounds or open sores. Avoid contact with your eyes, ears, mouth and genitals (private parts). Wash Face and genitals (private parts)  with your normal soap.  6. Wash thoroughly, paying special attention to the area where your surgery will be performed.  7. Thoroughly rinse your body with warm water from the neck down.  8. DO NOT shower/wash with your normal soap after using and rinsing off the CHG Soap.  9. Pat yourself dry with a CLEAN TOWEL.  10. Wear CLEAN PAJAMAS to bed the night before surgery, wear comfortable clothes the morning of surgery  11. Place CLEAN SHEETS on your bed the night of your first shower and DO NOT SLEEP WITH  PETS.    Day of Surgery: Shower as stated above. Do not apply any deodorants/lotions. Please wear clean clothes to the hospital/surgery center.      Please read over the following fact sheets that you were given.

## 2017-02-12 ENCOUNTER — Ambulatory Visit
Admission: RE | Admit: 2017-02-12 | Discharge: 2017-02-12 | Disposition: A | Discharge status: Home / Self Care | Payer: Medicare Other | Source: Ambulatory Visit | Attending: Orthopedic Surgery | Admitting: Orthopedic Surgery

## 2017-02-12 ENCOUNTER — Encounter (HOSPITAL_COMMUNITY)
Admission: RE | Admit: 2017-02-12 | Discharge: 2017-02-12 | Disposition: A | Discharge status: Home / Self Care | Payer: Medicare Other | Source: Ambulatory Visit | Attending: Orthopedic Surgery | Admitting: Orthopedic Surgery

## 2017-02-12 ENCOUNTER — Encounter (HOSPITAL_COMMUNITY): Payer: Self-pay

## 2017-02-12 ENCOUNTER — Other Ambulatory Visit: Payer: Medicare Other

## 2017-02-12 ENCOUNTER — Ambulatory Visit (HOSPITAL_COMMUNITY)
Admission: RE | Admit: 2017-02-12 | Discharge: 2017-02-12 | Disposition: A | Discharge status: Home / Self Care | Payer: Medicare Other | Source: Ambulatory Visit | Attending: Orthopedic Surgery | Admitting: Orthopedic Surgery

## 2017-02-12 DIAGNOSIS — Z01818 Encounter for other preprocedural examination: Secondary | ICD-10-CM | POA: Diagnosis not present

## 2017-02-12 DIAGNOSIS — M542 Cervicalgia: Secondary | ICD-10-CM

## 2017-02-12 DIAGNOSIS — Z86718 Personal history of other venous thrombosis and embolism: Secondary | ICD-10-CM | POA: Insufficient documentation

## 2017-02-12 DIAGNOSIS — Z85118 Personal history of other malignant neoplasm of bronchus and lung: Secondary | ICD-10-CM | POA: Insufficient documentation

## 2017-02-12 DIAGNOSIS — R918 Other nonspecific abnormal finding of lung field: Secondary | ICD-10-CM | POA: Diagnosis not present

## 2017-02-12 DIAGNOSIS — Z01812 Encounter for preprocedural laboratory examination: Secondary | ICD-10-CM | POA: Insufficient documentation

## 2017-02-12 DIAGNOSIS — E119 Type 2 diabetes mellitus without complications: Secondary | ICD-10-CM | POA: Insufficient documentation

## 2017-02-12 DIAGNOSIS — M47812 Spondylosis without myelopathy or radiculopathy, cervical region: Secondary | ICD-10-CM | POA: Diagnosis not present

## 2017-02-12 HISTORY — DX: Anxiety disorder, unspecified: F41.9

## 2017-02-12 HISTORY — DX: Thyrotoxicosis, unspecified without thyrotoxic crisis or storm: E05.90

## 2017-02-12 LAB — CBC WITH DIFFERENTIAL/PLATELET
BASOS ABS: 0 10*3/uL (ref 0.0–0.1)
Basophils Relative: 0 %
EOS PCT: 0 %
Eosinophils Absolute: 0 10*3/uL (ref 0.0–0.7)
HCT: 40.8 % (ref 36.0–46.0)
Hemoglobin: 13.3 g/dL (ref 12.0–15.0)
LYMPHS PCT: 5 %
Lymphs Abs: 1.2 10*3/uL (ref 0.7–4.0)
MCH: 27.9 pg (ref 26.0–34.0)
MCHC: 32.6 g/dL (ref 30.0–36.0)
MCV: 85.5 fL (ref 78.0–100.0)
Monocytes Absolute: 0.5 10*3/uL (ref 0.1–1.0)
Monocytes Relative: 2 %
Neutro Abs: 22.5 10*3/uL — ABNORMAL HIGH (ref 1.7–7.7)
Neutrophils Relative %: 93 %
PLATELETS: 292 10*3/uL (ref 150–400)
RBC: 4.77 MIL/uL (ref 3.87–5.11)
RDW: 15.3 % (ref 11.5–15.5)
WBC: 24.3 10*3/uL — AB (ref 4.0–10.5)

## 2017-02-12 LAB — TYPE AND SCREEN
ABO/RH(D): B POS
Antibody Screen: NEGATIVE

## 2017-02-12 LAB — COMPREHENSIVE METABOLIC PANEL
ALBUMIN: 3.7 g/dL (ref 3.5–5.0)
ALT: 22 U/L (ref 14–54)
AST: 18 U/L (ref 15–41)
Alkaline Phosphatase: 69 U/L (ref 38–126)
Anion gap: 12 (ref 5–15)
BUN: 26 mg/dL — AB (ref 6–20)
CHLORIDE: 100 mmol/L — AB (ref 101–111)
CO2: 23 mmol/L (ref 22–32)
CREATININE: 0.74 mg/dL (ref 0.44–1.00)
Calcium: 8.6 mg/dL — ABNORMAL LOW (ref 8.9–10.3)
GFR calc Af Amer: >60 mL/min (ref >60)
GLUCOSE: 139 mg/dL — AB (ref 65–99)
Potassium: 3.6 mmol/L (ref 3.5–5.1)
SODIUM: 135 mmol/L (ref 135–145)
Total Bilirubin: 0.7 mg/dL (ref 0.3–1.2)
Total Protein: 6.9 g/dL (ref 6.5–8.1)

## 2017-02-12 LAB — SURGICAL PCR SCREEN
MRSA, PCR: NEGATIVE
Staphylococcus aureus: POSITIVE — AB

## 2017-02-12 LAB — URINALYSIS, ROUTINE W REFLEX MICROSCOPIC
BILIRUBIN URINE: NEGATIVE
Glucose, UA: NEGATIVE mg/dL
HGB URINE DIPSTICK: NEGATIVE
Ketones, ur: NEGATIVE mg/dL
NITRITE: NEGATIVE
PH: 6 (ref 5.0–8.0)
Protein, ur: 30 mg/dL — AB
SPECIFIC GRAVITY, URINE: 1.013 (ref 1.005–1.030)

## 2017-02-12 LAB — PROTIME-INR
INR: 0.95
Prothrombin Time: 12.6 seconds (ref 11.4–15.2)

## 2017-02-12 LAB — APTT: APTT: 24 s (ref 24–36)

## 2017-02-12 LAB — HEMOGLOBIN A1C
Hgb A1c MFr Bld: 6.5 % — ABNORMAL HIGH (ref 4.8–5.6)
MEAN PLASMA GLUCOSE: 139.85 mg/dL

## 2017-02-12 LAB — GLUCOSE, CAPILLARY: GLUCOSE-CAPILLARY: 137 mg/dL — AB (ref 65–99)

## 2017-02-12 NOTE — Progress Notes (Signed)
Left voice message with Angela Nevin, CMA, to make Dr. Lynann Bologna aware of WBC count of 24.3 and UA ( many bacteria and trace leukocytes). Pt chart forwarded to anesthesia.

## 2017-02-12 NOTE — Progress Notes (Signed)
PCP - Dr. Billey Gosling Cardiologist - patient denies Oncologist - Dr. Marin Olp Endocrinologist - Dr. Cruzita Lederer  Chest x-ray - 02/12/17 EKG - 02/12/17 Stress Test - patient denies ECHO - patient denies Cardiac Cath - patient denies  Sleep Study - patient denies   Fasting Blood Sugar - 130-150 normally but since starting decadron this week CBG has been 180's Checks Blood Sugar 1 time a day currently since starting decadron but before last week patient would check it every few days.    Patient denies shortness of breath, fever, cough and chest pain at PAT appointment   Patient verbalized understanding of instructions that were given to them at the PAT appointment. Patient was also instructed that they will need to review over the PAT instructions again at home before surgery.

## 2017-02-15 ENCOUNTER — Telehealth: Payer: Self-pay | Admitting: *Deleted

## 2017-02-15 NOTE — Telephone Encounter (Signed)
Patient continues to have high blood sugar levels. She contacted her PCP and her metformin doses were increased. This however has not helped the elevated levels. She wants to know if she can stop the decadron. She has surgery this week.  Spoke with Laverna Peace NP. Patient needs to remain on her current dosing of decadron until surgery. Likely, once patient has surgery, she will be able to reduce/discontinue decadron and hopefully her blood sugar levels will return to baseline.  Patient understands instructions. She will continue decadron until she is told otherwise. She will continue to monitor blood sugar levels.

## 2017-02-16 ENCOUNTER — Ambulatory Visit: Payer: Medicare Other | Admitting: Physical Therapy

## 2017-02-16 ENCOUNTER — Other Ambulatory Visit: Payer: Self-pay | Admitting: Orthopedic Surgery

## 2017-02-16 NOTE — Progress Notes (Signed)
Anesthesia Chart Review:  Pt is an 83 year old female scheduled for C6-7 ACDF on 02/17/2017 and C5-6, C6-7, C7-T1, T1-T2 posterior spinal fusion and C6-7 decompression on 02/18/2017 with Phylliss Bob, MD  - PCP is Billey Gosling, MD - Oncologist is Burney Gauze, MD - Endocrinologist is Philemon Kingdom, MD  PMH includes:  Stage IV lung adenocarcinoma, HTN, DM, hyperlipidemia, DVT, post- op N/V. Hx hypothyroidism after thyroiditis (treated with synthroid for many years), now has thyrotoxicosis from toxic multinodular goiter and is treated for hyperthyroidism. Hard of hearing. Never smoker. BMI 24. S/p video bronchoscopy 12/10/15. S/p R VATS, wedge resection 07/17/11.   Medications include: bisoprolol-hctz, decadron, lovastatin, metformin, methimazole, protonix, ramipril.   BP (!) 152/62   Pulse 63   Temp 36.7 C   Resp (!) 98   Ht 5\' 1"  (1.549 m)   Wt 126 lb 9.6 oz (57.4 kg)   SpO2 100%   BMI 23.92 kg/m   Preoperative labs reviewed.   - UA with many bacteria.  - HbA1c 6.5, glucose 139 - WBC 24.3.  Pt is taking decaron 8mg  TID.   CXR 02/12/17: Chronic changes similar to that seen on recent CT examination.  CT chest 01/20/17:  1. Stable exam. No new or progressive findings. Bilateral pulmonary nodules identified on the previous study are similar on today's exam. 2.  Aortic Atherosclerois   EKG 02/12/17: NSR  Carla in Dr. Laurena Bering office notified of abnormal UA and elevated CBC.  Dakwan Pridgen Nevin reports pt will be treated with levaquin for UTI.   If no changes, I anticipate pt can proceed with surgery as scheduled.   Willeen Cass, FNP-BC [facility] Short Stay Surgical Center/Anesthesiology Phone: [phone] 02/16/2017 2:08 PM

## 2017-02-17 ENCOUNTER — Encounter (HOSPITAL_COMMUNITY): Payer: Self-pay | Admitting: *Deleted

## 2017-02-17 ENCOUNTER — Other Ambulatory Visit: Payer: Self-pay | Admitting: Family

## 2017-02-17 ENCOUNTER — Ambulatory Visit: Payer: Medicare Other

## 2017-02-17 ENCOUNTER — Other Ambulatory Visit: Payer: Medicare Other

## 2017-02-17 ENCOUNTER — Ambulatory Visit: Payer: Medicare Other | Admitting: Family

## 2017-02-17 ENCOUNTER — Inpatient Hospital Stay (HOSPITAL_COMMUNITY): Payer: Medicare Other

## 2017-02-17 ENCOUNTER — Inpatient Hospital Stay (HOSPITAL_COMMUNITY): Payer: Medicare Other | Admitting: Emergency Medicine

## 2017-02-17 ENCOUNTER — Encounter (HOSPITAL_COMMUNITY)
Admission: RE | Disposition: A | Discharge status: Home / Self Care | Payer: Self-pay | Source: Ambulatory Visit | Attending: Orthopedic Surgery

## 2017-02-17 ENCOUNTER — Inpatient Hospital Stay (HOSPITAL_COMMUNITY)
Admission: RE | Admit: 2017-02-17 | Discharge: 2017-02-19 | DRG: 454 | Disposition: A | Discharge status: Home / Self Care | Payer: Medicare Other | Source: Ambulatory Visit | Attending: Orthopedic Surgery | Admitting: Orthopedic Surgery

## 2017-02-17 ENCOUNTER — Inpatient Hospital Stay (HOSPITAL_COMMUNITY): Payer: Medicare Other | Admitting: Certified Registered Nurse Anesthetist

## 2017-02-17 DIAGNOSIS — M5412 Radiculopathy, cervical region: Secondary | ICD-10-CM | POA: Diagnosis present

## 2017-02-17 DIAGNOSIS — Z8249 Family history of ischemic heart disease and other diseases of the circulatory system: Secondary | ICD-10-CM | POA: Diagnosis not present

## 2017-02-17 DIAGNOSIS — M5410 Radiculopathy, site unspecified: Secondary | ICD-10-CM | POA: Diagnosis present

## 2017-02-17 DIAGNOSIS — Z419 Encounter for procedure for purposes other than remedying health state, unspecified: Secondary | ICD-10-CM

## 2017-02-17 DIAGNOSIS — F419 Anxiety disorder, unspecified: Secondary | ICD-10-CM | POA: Diagnosis not present

## 2017-02-17 DIAGNOSIS — M810 Age-related osteoporosis without current pathological fracture: Secondary | ICD-10-CM | POA: Diagnosis present

## 2017-02-17 DIAGNOSIS — I10 Essential (primary) hypertension: Secondary | ICD-10-CM | POA: Diagnosis present

## 2017-02-17 DIAGNOSIS — Z7952 Long term (current) use of systemic steroids: Secondary | ICD-10-CM

## 2017-02-17 DIAGNOSIS — E119 Type 2 diabetes mellitus without complications: Secondary | ICD-10-CM | POA: Diagnosis present

## 2017-02-17 DIAGNOSIS — Z902 Acquired absence of lung [part of]: Secondary | ICD-10-CM

## 2017-02-17 DIAGNOSIS — C7989 Secondary malignant neoplasm of other specified sites: Secondary | ICD-10-CM | POA: Diagnosis not present

## 2017-02-17 DIAGNOSIS — E785 Hyperlipidemia, unspecified: Secondary | ICD-10-CM | POA: Diagnosis present

## 2017-02-17 DIAGNOSIS — C3431 Malignant neoplasm of lower lobe, right bronchus or lung: Secondary | ICD-10-CM

## 2017-02-17 DIAGNOSIS — M4322 Fusion of spine, cervical region: Secondary | ICD-10-CM | POA: Diagnosis not present

## 2017-02-17 DIAGNOSIS — Z79899 Other long term (current) drug therapy: Secondary | ICD-10-CM | POA: Diagnosis not present

## 2017-02-17 DIAGNOSIS — Z7984 Long term (current) use of oral hypoglycemic drugs: Secondary | ICD-10-CM | POA: Diagnosis not present

## 2017-02-17 DIAGNOSIS — G893 Neoplasm related pain (acute) (chronic): Secondary | ICD-10-CM | POA: Diagnosis present

## 2017-02-17 DIAGNOSIS — H9190 Unspecified hearing loss, unspecified ear: Secondary | ICD-10-CM | POA: Diagnosis present

## 2017-02-17 DIAGNOSIS — M4802 Spinal stenosis, cervical region: Principal | ICD-10-CM | POA: Diagnosis present

## 2017-02-17 DIAGNOSIS — Z833 Family history of diabetes mellitus: Secondary | ICD-10-CM

## 2017-02-17 DIAGNOSIS — C7951 Secondary malignant neoplasm of bone: Secondary | ICD-10-CM | POA: Diagnosis not present

## 2017-02-17 DIAGNOSIS — C3490 Malignant neoplasm of unspecified part of unspecified bronchus or lung: Secondary | ICD-10-CM

## 2017-02-17 DIAGNOSIS — Z79891 Long term (current) use of opiate analgesic: Secondary | ICD-10-CM

## 2017-02-17 DIAGNOSIS — C229 Malignant neoplasm of liver, not specified as primary or secondary: Secondary | ICD-10-CM | POA: Diagnosis not present

## 2017-02-17 DIAGNOSIS — E7800 Pure hypercholesterolemia, unspecified: Secondary | ICD-10-CM | POA: Diagnosis not present

## 2017-02-17 HISTORY — PX: ANTERIOR CERVICAL DECOMP/DISCECTOMY FUSION: SHX1161

## 2017-02-17 LAB — GLUCOSE, CAPILLARY
GLUCOSE-CAPILLARY: 97 mg/dL (ref 65–99)
GLUCOSE-CAPILLARY: 110 mg/dL — AB (ref 65–99)
Glucose-Capillary: 132 mg/dL — ABNORMAL HIGH (ref 65–99)
Glucose-Capillary: 168 mg/dL — ABNORMAL HIGH (ref 65–99)

## 2017-02-17 LAB — CBC
HCT: 38.4 % (ref 36.0–46.0)
Hemoglobin: 12.6 g/dL (ref 12.0–15.0)
MCH: 27.9 pg (ref 26.0–34.0)
MCHC: 32.8 g/dL (ref 30.0–36.0)
MCV: 85.1 fL (ref 78.0–100.0)
PLATELETS: 197 10*3/uL (ref 150–400)
RBC: 4.51 MIL/uL (ref 3.87–5.11)
RDW: 15.9 % — AB (ref 11.5–15.5)
WBC: 14.4 10*3/uL — ABNORMAL HIGH (ref 4.0–10.5)

## 2017-02-17 SURGERY — ANTERIOR CERVICAL DECOMPRESSION/DISCECTOMY FUSION 1 LEVEL
Anesthesia: General | Site: Neck

## 2017-02-17 MED ORDER — AMITRIPTYLINE HCL 10 MG PO TABS
10.0000 mg | ORAL_TABLET | Freq: Every day | ORAL | Status: DC
  Administered 2017-02-17: 10 mg via ORAL
  Filled 2017-02-17 (×3): qty 1

## 2017-02-17 MED ORDER — SURGIFOAM 100 EX MISC
CUTANEOUS | Status: DC | PRN
  Administered 2017-02-17: 15:00:00 via TOPICAL

## 2017-02-17 MED ORDER — METOCLOPRAMIDE HCL 5 MG/ML IJ SOLN: 10.0000 mg | Freq: Once | INTRAMUSCULAR | Status: DC | PRN

## 2017-02-17 MED ORDER — METFORMIN HCL 500 MG PO TABS
1000.0000 mg | ORAL_TABLET | Freq: Two times a day (BID) | ORAL | Status: DC
  Administered 2017-02-18 – 2017-02-19 (×2): 1000 mg via ORAL
  Filled 2017-02-17 (×2): qty 2

## 2017-02-17 MED ORDER — BISACODYL 5 MG PO TBEC: 5.0000 mg | DELAYED_RELEASE_TABLET | Freq: Every day | ORAL | Status: DC | PRN

## 2017-02-17 MED ORDER — POTASSIUM CHLORIDE IN NACL 20-0.9 MEQ/L-% IV SOLN
INTRAVENOUS | Status: DC
  Filled 2017-02-17: qty 1000

## 2017-02-17 MED ORDER — SUGAMMADEX SODIUM 200 MG/2ML IV SOLN
INTRAVENOUS | Status: DC | PRN
  Administered 2017-02-17: 200 mg via INTRAVENOUS

## 2017-02-17 MED ORDER — SENNOSIDES-DOCUSATE SODIUM 8.6-50 MG PO TABS: 1.0000 | ORAL_TABLET | Freq: Every evening | ORAL | Status: DC | PRN

## 2017-02-17 MED ORDER — METHIMAZOLE 5 MG PO TABS
5.0000 mg | ORAL_TABLET | ORAL | Status: DC
  Administered 2017-02-19: 5 mg via ORAL
  Filled 2017-02-17: qty 1

## 2017-02-17 MED ORDER — PROPOFOL 10 MG/ML IV BOLUS
INTRAVENOUS | Status: DC | PRN
  Administered 2017-02-17: 100 mg via INTRAVENOUS

## 2017-02-17 MED ORDER — LACTATED RINGERS IV SOLN
INTRAVENOUS | Status: DC
  Administered 2017-02-17 (×2): via INTRAVENOUS

## 2017-02-17 MED ORDER — FLEET ENEMA 7-19 GM/118ML RE ENEM: 1.0000 | ENEMA | Freq: Once | RECTAL | Status: DC | PRN

## 2017-02-17 MED ORDER — BUPIVACAINE-EPINEPHRINE (PF) 0.25% -1:200000 IJ SOLN
INTRAMUSCULAR | Status: AC
  Filled 2017-02-17: qty 30

## 2017-02-17 MED ORDER — PHENYLEPHRINE HCL 10 MG/ML IJ SOLN
INTRAVENOUS | Status: DC | PRN
  Administered 2017-02-17: 10 ug/min via INTRAVENOUS

## 2017-02-17 MED ORDER — HYDROMORPHONE HCL 1 MG/ML IJ SOLN
INTRAMUSCULAR | Status: AC
  Filled 2017-02-17: qty 1

## 2017-02-17 MED ORDER — MORPHINE SULFATE (PF) 2 MG/ML IV SOLN: 1.0000 mg | INTRAVENOUS | Status: DC | PRN

## 2017-02-17 MED ORDER — PHENOL 1.4 % MT LIQD: 1.0000 | OROMUCOSAL | Status: DC | PRN

## 2017-02-17 MED ORDER — PRAVASTATIN SODIUM 40 MG PO TABS
20.0000 mg | ORAL_TABLET | Freq: Every day | ORAL | Status: DC
  Administered 2017-02-18: 20 mg via ORAL
  Filled 2017-02-17: qty 1

## 2017-02-17 MED ORDER — CEFAZOLIN SODIUM-DEXTROSE 2-4 GM/100ML-% IV SOLN
2.0000 g | INTRAVENOUS | Status: AC
  Administered 2017-02-17: 2 g via INTRAVENOUS

## 2017-02-17 MED ORDER — RAMIPRIL 5 MG PO CAPS
5.0000 mg | ORAL_CAPSULE | Freq: Every evening | ORAL | Status: DC
  Administered 2017-02-17 – 2017-02-18 (×2): 5 mg via ORAL
  Filled 2017-02-17 (×3): qty 1

## 2017-02-17 MED ORDER — 0.9 % SODIUM CHLORIDE (POUR BTL) OPTIME
TOPICAL | Status: DC | PRN
  Administered 2017-02-17: 1000 mL

## 2017-02-17 MED ORDER — LEVOFLOXACIN 750 MG PO TABS
750.0000 mg | ORAL_TABLET | ORAL | Status: DC
  Administered 2017-02-18: 750 mg via ORAL
  Filled 2017-02-17: qty 1

## 2017-02-17 MED ORDER — SODIUM CHLORIDE 0.9 % IV SOLN: 250.0000 mL | INTRAVENOUS | Status: DC

## 2017-02-17 MED ORDER — FENTANYL CITRATE (PF) 250 MCG/5ML IJ SOLN
INTRAMUSCULAR | Status: AC
  Filled 2017-02-17: qty 5

## 2017-02-17 MED ORDER — DOCUSATE SODIUM 100 MG PO CAPS
100.0000 mg | ORAL_CAPSULE | Freq: Two times a day (BID) | ORAL | Status: DC
  Administered 2017-02-17: 100 mg via ORAL
  Filled 2017-02-17: qty 1

## 2017-02-17 MED ORDER — HYDROMORPHONE HCL 2 MG PO TABS
1.0000 mg | ORAL_TABLET | ORAL | Status: DC | PRN
  Administered 2017-02-18 – 2017-02-19 (×3): 2 mg via ORAL
  Filled 2017-02-17 (×3): qty 1

## 2017-02-17 MED ORDER — POVIDONE-IODINE 7.5 % EX SOLN
Freq: Once | CUTANEOUS | Status: DC
  Filled 2017-02-17: qty 118

## 2017-02-17 MED ORDER — PANTOPRAZOLE SODIUM 40 MG IV SOLR: 40.0000 mg | Freq: Every day | INTRAVENOUS | Status: DC

## 2017-02-17 MED ORDER — GABAPENTIN 100 MG PO CAPS
100.0000 mg | ORAL_CAPSULE | Freq: Three times a day (TID) | ORAL | Status: DC
  Administered 2017-02-17 – 2017-02-19 (×5): 100 mg via ORAL
  Filled 2017-02-17 (×5): qty 1

## 2017-02-17 MED ORDER — CALCIUM CARBONATE 1250 (500 CA) MG PO TABS
1250.0000 mg | ORAL_TABLET | Freq: Every day | ORAL | Status: DC
  Administered 2017-02-19: 1250 mg via ORAL
  Filled 2017-02-17: qty 1

## 2017-02-17 MED ORDER — SODIUM CHLORIDE 0.9% FLUSH: 3.0000 mL | INTRAVENOUS | Status: DC | PRN

## 2017-02-17 MED ORDER — LIDOCAINE HCL (CARDIAC) 20 MG/ML IV SOLN
INTRAVENOUS | Status: DC | PRN
  Administered 2017-02-17: 40 mg via INTRAVENOUS

## 2017-02-17 MED ORDER — CEFAZOLIN SODIUM-DEXTROSE 2-4 GM/100ML-% IV SOLN
2.0000 g | INTRAVENOUS | Status: DC
  Filled 2017-02-17: qty 100

## 2017-02-17 MED ORDER — ACETAMINOPHEN 325 MG PO TABS: 650.0000 mg | ORAL_TABLET | ORAL | Status: DC | PRN

## 2017-02-17 MED ORDER — ZOLPIDEM TARTRATE 5 MG PO TABS: 5.0000 mg | ORAL_TABLET | Freq: Every evening | ORAL | Status: DC | PRN

## 2017-02-17 MED ORDER — BUPIVACAINE-EPINEPHRINE 0.25% -1:200000 IJ SOLN
INTRAMUSCULAR | Status: DC | PRN
  Administered 2017-02-17: 30 mL

## 2017-02-17 MED ORDER — DEXAMETHASONE 4 MG PO TABS: 4.0000 mg | ORAL_TABLET | Freq: Three times a day (TID) | ORAL | Status: DC

## 2017-02-17 MED ORDER — BISOPROLOL-HYDROCHLOROTHIAZIDE 2.5-6.25 MG PO TABS
1.0000 | ORAL_TABLET | Freq: Every day | ORAL | Status: DC
  Administered 2017-02-19: 1 via ORAL
  Filled 2017-02-17 (×3): qty 1

## 2017-02-17 MED ORDER — PANTOPRAZOLE SODIUM 40 MG PO TBEC
40.0000 mg | DELAYED_RELEASE_TABLET | Freq: Every day | ORAL | Status: DC
  Administered 2017-02-18 – 2017-02-19 (×2): 40 mg via ORAL
  Filled 2017-02-17 (×2): qty 1

## 2017-02-17 MED ORDER — DEXAMETHASONE 4 MG PO TABS
4.0000 mg | ORAL_TABLET | Freq: Two times a day (BID) | ORAL | Status: AC
  Administered 2017-02-18 – 2017-02-19 (×2): 4 mg via ORAL
  Filled 2017-02-17 (×2): qty 1

## 2017-02-17 MED ORDER — ONDANSETRON HCL 4 MG/2ML IJ SOLN: 4.0000 mg | Freq: Four times a day (QID) | INTRAMUSCULAR | Status: DC | PRN

## 2017-02-17 MED ORDER — SODIUM CHLORIDE 0.9% FLUSH: 3.0000 mL | Freq: Two times a day (BID) | INTRAVENOUS | Status: DC

## 2017-02-17 MED ORDER — CEFAZOLIN SODIUM 1 G IJ SOLR
INTRAMUSCULAR | Status: AC
  Filled 2017-02-17: qty 30

## 2017-02-17 MED ORDER — ALUM & MAG HYDROXIDE-SIMETH 200-200-20 MG/5ML PO SUSP: 30.0000 mL | Freq: Four times a day (QID) | ORAL | Status: DC | PRN

## 2017-02-17 MED ORDER — CEFAZOLIN SODIUM-DEXTROSE 2-4 GM/100ML-% IV SOLN
2.0000 g | Freq: Three times a day (TID) | INTRAVENOUS | Status: AC
  Administered 2017-02-17 – 2017-02-18 (×2): 2 g via INTRAVENOUS
  Filled 2017-02-17 (×2): qty 100

## 2017-02-17 MED ORDER — FENTANYL CITRATE (PF) 100 MCG/2ML IJ SOLN
INTRAMUSCULAR | Status: DC | PRN
  Administered 2017-02-17: 50 ug via INTRAVENOUS
  Administered 2017-02-17: 100 ug via INTRAVENOUS
  Administered 2017-02-17 (×2): 50 ug via INTRAVENOUS

## 2017-02-17 MED ORDER — DEXAMETHASONE 4 MG PO TABS: 4.0000 mg | ORAL_TABLET | Freq: Every day | ORAL | Status: DC

## 2017-02-17 MED ORDER — HYDROMORPHONE HCL 1 MG/ML IJ SOLN
0.2500 mg | INTRAMUSCULAR | Status: DC | PRN
  Administered 2017-02-17: 0.5 mg via INTRAVENOUS

## 2017-02-17 MED ORDER — MENTHOL 3 MG MT LOZG
1.0000 | LOZENGE | OROMUCOSAL | Status: DC | PRN
  Filled 2017-02-17: qty 9

## 2017-02-17 MED ORDER — ACETAMINOPHEN 650 MG RE SUPP: 650.0000 mg | RECTAL | Status: DC | PRN

## 2017-02-17 MED ORDER — ONDANSETRON HCL 4 MG/2ML IJ SOLN
INTRAMUSCULAR | Status: DC | PRN
  Administered 2017-02-17: 4 mg via INTRAVENOUS

## 2017-02-17 MED ORDER — ONDANSETRON HCL 4 MG PO TABS: 4.0000 mg | ORAL_TABLET | Freq: Four times a day (QID) | ORAL | Status: DC | PRN

## 2017-02-17 MED ORDER — DEXAMETHASONE 4 MG PO TABS
4.0000 mg | ORAL_TABLET | Freq: Three times a day (TID) | ORAL | Status: AC
  Administered 2017-02-17: 4 mg via ORAL
  Filled 2017-02-17: qty 1

## 2017-02-17 MED ORDER — DIAZEPAM 5 MG PO TABS: 5.0000 mg | ORAL_TABLET | Freq: Four times a day (QID) | ORAL | Status: DC | PRN

## 2017-02-17 MED ORDER — ROCURONIUM BROMIDE 100 MG/10ML IV SOLN
INTRAVENOUS | Status: DC | PRN
  Administered 2017-02-17: 50 mg via INTRAVENOUS

## 2017-02-17 SURGICAL SUPPLY — 74 items
BENZOIN TINCTURE PRP APPL 2/3 (GAUZE/BANDAGES/DRESSINGS) ×3 IMPLANT
BIT DRILL NEURO 2X3.1 SFT TUCH (MISCELLANEOUS) ×1 IMPLANT
BIT DRILL SRG 14X2.2XFLT CHK (BIT) ×1 IMPLANT
BIT DRL SRG 14X2.2XFLT CHK (BIT) ×1
BLADE CLIPPER SURG (BLADE) ×3 IMPLANT
BLADE SURG 15 STRL LF DISP TIS (BLADE) ×1 IMPLANT
BLADE SURG 15 STRL SS (BLADE) ×2
BONE VIVIGEN FORMABLE 1.3CC (Bone Implant) ×3 IMPLANT
BUR MATCHSTICK NEURO 3.0 LAGG (BURR) ×3 IMPLANT
CARTRIDGE OIL MAESTRO DRILL (MISCELLANEOUS) ×1 IMPLANT
CLOSURE STERI-STRIP 1/2X4 (GAUZE/BANDAGES/DRESSINGS) ×1
CLOSURE WOUND 1/2 X4 (GAUZE/BANDAGES/DRESSINGS) ×1
CLSR STERI-STRIP ANTIMIC 1/2X4 (GAUZE/BANDAGES/DRESSINGS) ×2 IMPLANT
CORDS BIPOLAR (ELECTRODE) ×3 IMPLANT
COVER SURGICAL LIGHT HANDLE (MISCELLANEOUS) ×3 IMPLANT
CRADLE DONUT ADULT HEAD (MISCELLANEOUS) ×3 IMPLANT
DIFFUSER DRILL AIR PNEUMATIC (MISCELLANEOUS) ×3 IMPLANT
DRAIN JACKSON RD 7FR 3/32 (WOUND CARE) IMPLANT
DRAPE C-ARM 42X72 X-RAY (DRAPES) ×3 IMPLANT
DRAPE POUCH INSTRU U-SHP 10X18 (DRAPES) ×3 IMPLANT
DRAPE SURG 17X23 STRL (DRAPES) ×9 IMPLANT
DRILL BIT SKYLINE 14MM (BIT) ×2
DRILL NEURO 2X3.1 SOFT TOUCH (MISCELLANEOUS) ×3
DURAPREP 26ML APPLICATOR (WOUND CARE) ×3 IMPLANT
ELECT COATED BLADE 2.86 ST (ELECTRODE) ×3 IMPLANT
ELECT REM PT RETURN 9FT ADLT (ELECTROSURGICAL) ×3
ELECTRODE REM PT RTRN 9FT ADLT (ELECTROSURGICAL) ×1 IMPLANT
EVACUATOR SILICONE 100CC (DRAIN) IMPLANT
GAUZE SPONGE 4X4 12PLY STRL (GAUZE/BANDAGES/DRESSINGS) ×3 IMPLANT
GAUZE SPONGE 4X4 16PLY XRAY LF (GAUZE/BANDAGES/DRESSINGS) ×3 IMPLANT
GLOVE BIO SURGEON STRL SZ7 (GLOVE) ×3 IMPLANT
GLOVE BIO SURGEON STRL SZ8 (GLOVE) ×3 IMPLANT
GLOVE BIOGEL PI IND STRL 7.0 (GLOVE) ×2 IMPLANT
GLOVE BIOGEL PI IND STRL 8 (GLOVE) ×1 IMPLANT
GLOVE BIOGEL PI INDICATOR 7.0 (GLOVE) ×4
GLOVE BIOGEL PI INDICATOR 8 (GLOVE) ×2
GOWN STRL REUS W/ TWL LRG LVL3 (GOWN DISPOSABLE) ×1 IMPLANT
GOWN STRL REUS W/ TWL XL LVL3 (GOWN DISPOSABLE) ×1 IMPLANT
GOWN STRL REUS W/TWL LRG LVL3 (GOWN DISPOSABLE) ×2
GOWN STRL REUS W/TWL XL LVL3 (GOWN DISPOSABLE) ×2
IMPL S ENDOSKEL TC 7 ODEG (Orthopedic Implant) ×1 IMPLANT
IMPLANT S ENDOSKEL TC 7 ODEG (Orthopedic Implant) ×3 IMPLANT
IV CATH 14GX2 1/4 (CATHETERS) ×3 IMPLANT
KIT BASIN OR (CUSTOM PROCEDURE TRAY) ×3 IMPLANT
KIT ROOM TURNOVER OR (KITS) ×3 IMPLANT
MANIFOLD NEPTUNE II (INSTRUMENTS) ×3 IMPLANT
NEEDLE PRECISIONGLIDE 27X1.5 (NEEDLE) ×3 IMPLANT
NEEDLE SPNL 20GX3.5 QUINCKE YW (NEEDLE) ×3 IMPLANT
NS IRRIG 1000ML POUR BTL (IV SOLUTION) ×3 IMPLANT
OIL CARTRIDGE MAESTRO DRILL (MISCELLANEOUS) ×3
PACK ORTHO CERVICAL (CUSTOM PROCEDURE TRAY) ×3 IMPLANT
PAD ARMBOARD 7.5X6 YLW CONV (MISCELLANEOUS) ×6 IMPLANT
PATTIES SURGICAL .5 X.5 (GAUZE/BANDAGES/DRESSINGS) ×3 IMPLANT
PATTIES SURGICAL .5 X1 (DISPOSABLE) IMPLANT
PIN DISTRACTION 14 (PIN) ×6 IMPLANT
PLATE SKYLINE 12MM (Plate) ×3 IMPLANT
SCREW SKYLINE VARIABLE LG (Screw) ×12 IMPLANT
SPONGE INTESTINAL PEANUT (DISPOSABLE) ×3 IMPLANT
SPONGE SURGIFOAM ABS GEL 100 (HEMOSTASIS) ×3 IMPLANT
STRIP CLOSURE SKIN 1/2X4 (GAUZE/BANDAGES/DRESSINGS) ×2 IMPLANT
SURGIFLO W/THROMBIN 8M KIT (HEMOSTASIS) IMPLANT
SUT MNCRL AB 4-0 PS2 18 (SUTURE) ×3 IMPLANT
SUT SILK 4 0 (SUTURE)
SUT SILK 4-0 18XBRD TIE 12 (SUTURE) IMPLANT
SUT VIC AB 2-0 CT2 18 VCP726D (SUTURE) ×3 IMPLANT
SYR BULB IRRIGATION 50ML (SYRINGE) ×3 IMPLANT
SYR CONTROL 10ML LL (SYRINGE) ×6 IMPLANT
TAPE CLOTH 4X10 WHT NS (GAUZE/BANDAGES/DRESSINGS) ×3 IMPLANT
TAPE CLOTH SURG 4X10 WHT LF (GAUZE/BANDAGES/DRESSINGS) ×3 IMPLANT
TAPE UMBILICAL COTTON 1/8X30 (MISCELLANEOUS) ×3 IMPLANT
TOWEL OR 17X24 6PK STRL BLUE (TOWEL DISPOSABLE) ×3 IMPLANT
TOWEL OR 17X26 10 PK STRL BLUE (TOWEL DISPOSABLE) ×3 IMPLANT
WATER STERILE IRR 1000ML POUR (IV SOLUTION) ×3 IMPLANT
YANKAUER SUCT BULB TIP NO VENT (SUCTIONS) ×3 IMPLANT

## 2017-02-17 NOTE — Progress Notes (Signed)
PHARMACY NOTE:  ANTIMICROBIAL RENAL DOSAGE ADJUSTMENT  Current antimicrobial regimen includes a mismatch between antimicrobial dosage and estimated renal function.  As per policy approved by the Pharmacy & Therapeutics and Medical Executive Committees, the antimicrobial dosage will be adjusted accordingly.  Current antimicrobial dosage:  Levaquin 750 mg PO q24hrs  Indication:  Begun on 02/16/17 as outpatient per med history. Unsure of indication  Renal Function:  Estimated Creatinine Clearance: 37.4 mL/min (by C-G formula based on SCr of 0.74 mg/dL). []      On intermittent HD, scheduled: []      On CRRT    Antimicrobial dosage has been changed to:  Levaquin 750 mg PO q48hrs  Additional comments: next dose due 02/18/17.   Consider adding stop time to Levaquin order.    Thank you for allowing pharmacy to be a part of this patient's care.  Arty Baumgartner, [facility]  Pager: [phone] 02/17/2017 7:00 PM

## 2017-02-17 NOTE — Anesthesia Procedure Notes (Signed)
Procedure Name: Intubation Date/Time: 02/17/2017 1:46 PM Performed by: Clearnce Sorrel Pre-anesthesia Checklist: Patient identified, Emergency Drugs available, Suction available, Patient being monitored and Timeout performed Patient Re-evaluated:Patient Re-evaluated prior to induction Oxygen Delivery Method: Circle system utilized Preoxygenation: Pre-oxygenation with 100% oxygen Induction Type: IV induction Ventilation: Mask ventilation without difficulty Laryngoscope Size: Mac and 3 Grade View: Grade III Tube type: Oral Tube size: 6.5 mm Number of attempts: 2 Airway Equipment and Method: Stylet and Bougie stylet Placement Confirmation: ETT inserted through vocal cords under direct vision,  positive ETCO2 and breath sounds checked- equal and bilateral Secured at: 21 cm Tube secured with: Tape Dental Injury: Teeth and Oropharynx as per pre-operative assessment

## 2017-02-17 NOTE — Anesthesia Preprocedure Evaluation (Signed)
Anesthesia Evaluation  Patient identified by MRN, date of birth, ID band Patient awake    Reviewed: Allergy & Precautions, NPO status , Patient's Chart, lab work & pertinent test results  History of Anesthesia Complications (+) PONV, Family history of anesthesia reaction and history of anesthetic complications  Airway Mallampati: III  TM Distance: >3 FB Neck ROM: Full    Dental no notable dental hx. (+) Teeth Intact   Pulmonary shortness of breath and with exertion,  Adenocarcinoma right lung S/P Chemo Rx with mets to bone   Pulmonary exam normal breath sounds clear to auscultation       Cardiovascular hypertension, Normal cardiovascular exam Rhythm:Regular Rate:Normal     Neuro/Psych Anxiety  Neuromuscular disease    GI/Hepatic negative GI ROS, Neg liver ROS,   Endo/Other  diabetes, Well Controlled, Type 2, Oral Hypoglycemic AgentsHypothyroidism Hyperthyroidism   Renal/GU negative Renal ROS  negative genitourinary   Musculoskeletal  (+) Arthritis , Left arm pain cervical foraminal stenosis due to metastatic disease to C6-7 foramen   Abdominal   Peds  Hematology negative hematology ROS (+)   Anesthesia Other Findings   Reproductive/Obstetrics                             Anesthesia Physical Anesthesia Plan  ASA: III  Anesthesia Plan: General   Post-op Pain Management:    Induction: Intravenous  PONV Risk Score and Plan: 4 or greater and Ondansetron, Dexamethasone, Midazolam, Propofol infusion, Promethazine and Treatment may vary due to age or medical condition  Airway Management Planned: Oral ETT  Additional Equipment:   Intra-op Plan:   Post-operative Plan: Extubation in OR  Informed Consent: I have reviewed the patients History and Physical, chart, labs and discussed the procedure including the risks, benefits and alternatives for the proposed anesthesia with the patient or  authorized representative who has indicated his/her understanding and acceptance.   Dental advisory given  Plan Discussed with: Anesthesiologist, Surgeon and CRNA  Anesthesia Plan Comments:         Anesthesia Quick Evaluation

## 2017-02-17 NOTE — Progress Notes (Signed)
Patient arrived to unit via PACU stretcher.  Vital signs obtained. Patient and her family welcomed to unit. Patient assisted to void in bathroom. Positioned comfortably in the bed with family present. Samantha Quinn

## 2017-02-17 NOTE — Anesthesia Preprocedure Evaluation (Addendum)
Anesthesia Evaluation  Patient identified by MRN, date of birth, ID band Patient awake    Reviewed: Allergy & Precautions, NPO status , Patient's Chart, lab work & pertinent test results  History of Anesthesia Complications (+) PONV, Family history of anesthesia reaction and history of anesthetic complications  Airway Mallampati: III  TM Distance: >3 FB Neck ROM: Full    Dental no notable dental hx. (+) Teeth Intact   Pulmonary shortness of breath and with exertion,  Adenocarcinoma right lung S/P Chemo Rx with mets to bone   Pulmonary exam normal breath sounds clear to auscultation       Cardiovascular hypertension, Normal cardiovascular exam Rhythm:Regular Rate:Normal     Neuro/Psych Anxiety  Neuromuscular disease    GI/Hepatic negative GI ROS, Neg liver ROS,   Endo/Other  diabetes, Well Controlled, Type 2, Oral Hypoglycemic AgentsHypothyroidism Hyperthyroidism   Renal/GU negative Renal ROS  negative genitourinary   Musculoskeletal  (+) Arthritis , Left arm pain cervical foraminal stenosis due to metastatic disease to C6-7 foramen   Abdominal   Peds  Hematology negative hematology ROS (+)   Anesthesia Other Findings   Reproductive/Obstetrics                             Lab Results  Component Value Date   WBC 14.4 (H) 02/17/2017   HGB 12.6 02/17/2017   HCT 38.4 02/17/2017   MCV 85.1 02/17/2017   PLT 197 02/17/2017   Lab Results  Component Value Date   CREATININE 0.74 02/12/2017   BUN 26 (H) 02/12/2017   NA 135 02/12/2017   K 3.6 02/12/2017   CL 100 (L) 02/12/2017   CO2 23 02/12/2017    Anesthesia Physical  Anesthesia Plan  ASA: III  Anesthesia Plan: General   Post-op Pain Management:    Induction: Intravenous  PONV Risk Score and Plan: 4 or greater and Ondansetron, Midazolam, Propofol infusion, Promethazine, Treatment may vary due to age or medical condition and  Metaclopromide  Airway Management Planned: Oral ETT and Video Laryngoscope Planned  Additional Equipment:   Intra-op Plan:   Post-operative Plan: Extubation in OR and Possible Post-op intubation/ventilation  Informed Consent: I have reviewed the patients History and Physical, chart, labs and discussed the procedure including the risks, benefits and alternatives for the proposed anesthesia with the patient or authorized representative who has indicated his/her understanding and acceptance.   Dental advisory given  Plan Discussed with: Anesthesiologist, Surgeon and CRNA  Anesthesia Plan Comments:        Anesthesia Quick Evaluation

## 2017-02-17 NOTE — Progress Notes (Signed)
Pt report given to Nira Conn , RN on [place] RN to watch pt until room ready to go to

## 2017-02-17 NOTE — Transfer of Care (Signed)
Immediate Anesthesia Transfer of Care Note  Patient: Samantha Quinn  Procedure(s) Performed: ANTERIOR CERVICAL DECOMPRESSION FUSION, CERVICAL 6-7 WITH INSTRUMENTATION AND ALLOGRAFT (N/A Neck)  Patient Location: PACU  Anesthesia Type:General  Level of Consciousness: awake, alert  and oriented  Airway & Oxygen Therapy: Patient Spontanous Breathing and Patient connected to face mask oxygen  Post-op Assessment: Report given to RN and Post -op Vital signs reviewed and stable  Post vital signs: Reviewed and stable  Last Vitals:  Vitals:   02/17/17 0851  BP: (!) 158/72  Pulse: 80  Resp: 18  Temp: 36.7 C  SpO2: 100%    Last Pain:  Vitals:   02/17/17 0915  TempSrc:   PainSc: 0-No pain      Patients Stated Pain Goal: 4 (47/84/12 8208)  Complications: No apparent anesthesia complications

## 2017-02-17 NOTE — H&P (Signed)
PREOPERATIVE H&P  Chief Complaint: Left arm pain  HPI: Samantha Quinn is a 83 y.o. female who presents with ongoing pain in the left arm. Patient does have a diagnsis of metastatic lung cancer  MRI reveals stenosis on the left at C6/7 due to metastatic disease in the left C7 lateral mass, extending into the C6/7 foramen.  Patient has failed multiple forms of conservative care and continues to have pain (see office notes for additional details regarding the patient's full course of treatment)  Past Medical History:  Diagnosis Date  . Anxiety   . Back pain    bulding disc  . Bronchitis    hx of-many,many years   . Diabetes mellitus   . Diabetes mellitus    type 2 and takes Metformin bid  . Dyslipidemia   . Embolism - blood clot    hx of in left lower leg and was on Coumadin for about 60month;this was about 8-121yrago  . Family history of adverse reaction to anesthesia    Daughter- Nausea  . History of colonic polyps   . HOH (hard of hearing)   . Hx of migraines    as a teenager  . Hypercholesterolemia   . Hyperlipidemia    takes Lovastatin every other day  . Hypertension    takes Ziac daily and Ramipril as well  . Hyperthyroidism   . Hypothyroidism    takes Synthroid every other day  . Insomnia    takes Elavil nightly  . Joint pain   . Lung mass   . Myalgia   . Osteoarthritis   . Osteoporosis   . Pneumothorax of right lung after biopsy 07/09/11   HAD CHEST TUBE PLACEMENT  . PONV (postoperative nausea and vomiting)   . Primary adenocarcinoma of lower lobe of right lung [place]   pT2a, pN0 M0 Stage 1 Well differenced Adenocarcinoma 3.5 cm resected 07/17/2011  . Primary adenocarcinoma of lung ([facility]/2013  . Shortness of breath dyspnea    With exertion  . Skin cancer    legs have dark spots on them  . Upper respiratory infection 04/2011  . Urinary incontinence    takes Detrol daily prn   Past Surgical History:  Procedure Laterality Date  .  ABDOMINAL HYSTERECTOMY    . APPENDECTOMY     as a child  . CHEST TUBE REMOVAL  07/13/11   RIGHT  CHEST TUBE  . CHOLECYSTECTOMY    . COLONOSCOPY    . COLONOSCOPY    . DILATION AND CURETTAGE OF UTERUS     x 2  . EYE SURGERY Bilateral    Cataract  . HEMORRHOID SURGERY    . LUNG BIOPSY  07/09/11   RIGHT UPER LOBE LUNG MASS   . LUNG LOBECTOMY Right 06/2011  . RIGHT CHEST TUBE PLACEMENT  07/10/11   S/P LUNG BX  . ROTATOR CUFF REPAIR     bilateral  . TOE SURGERY  2013   right small toe  . TONSILLECTOMY     as a child  . VIDEO BRONCHOSCOPY  07/17/2011   Procedure: VIDEO BRONCHOSCOPY;  Surgeon: EdGrace IsaacMD;  [facility];  Laterality: N/A;  . VIDEO BRONCHOSCOPY WITH ENDOBRONCHIAL ULTRASOUND N/A 12/10/2015   Procedure: VIDEO BRONCHOSCOPY WITH ENDOBRONCHIAL ULTRASOUND with biopsies.;  Surgeon: EdGrace IsaacMD;  [place] [facility];  Laterality: N/A;   Social History   Social History  . Marital status: Widowed  Spouse name: N/A  . Number of children: N/A  . Years of education: N/A   Social History Main Topics  . Smoking status: Never Smoker  . Smokeless tobacco: Never Used  . Alcohol use No     Comment: "rarely have a glass of wine"  . Drug use: No  . Sexual activity: No   Other Topics Concern  . Not on file   Social History Narrative  . No narrative on file   Family History  Problem Relation Age of Onset  . Hypertension Mother   . Hypertension Father   . Arthritis Sister   . Arthritis Sister   . Diabetes Brother   . Hypertension Sister   . Anesthesia problems Neg Hx   . Hypotension Neg Hx   . Malignant hyperthermia Neg Hx   . Pseudochol deficiency Neg Hx   . Heart attack Neg Hx    Allergies  Allergen Reactions  . Tramadol Other (See Comments)    syncope  . Codeine Nausea And Vomiting  . Demerol Nausea Only   Prior to Admission medications   Medication Sig Start Date End Date Taking? Authorizing Provider    bisoprolol-hydrochlorothiazide [facility]) 2.5-6.25 MG tablet TAKE 1 TABLET DAILY 09/07/16  Yes Burns, Claudina Lick, MD  calcium carbonate (OS-CAL) 600 MG tablet Take 600 mg by mouth daily.   Yes [provider]  dexamethasone (DECADRON) 4 MG tablet Take 2 pills with food 3 times a day. Patient taking differently: Take 8 mg by mouth 3 (three) times daily.  02/05/17  Yes [name], MD  gabapentin (NEURONTIN) 100 MG capsule Take 100 mg by mouth 3 (three) times daily.    Yes [provider]  HYDROcodone-homatropine (HYCODAN) 5-1.5 MG/5ML syrup Take 5 mLs by mouth every 6 (six) hours as needed for cough. Patient taking differently: Take 2.5 mLs by mouth at bedtime.  10/21/16  Yes Ennever, Rudell Cobb, MD  lovastatin (MEVACOR) 20 MG tablet TAKE 1 TABLET BY MOUTH EVERY OTHER DAY 10/19/16  Yes Burns, Claudina Lick, MD  metFORMIN (GLUCOPHAGE) 500 MG tablet TAKE 1 TABLET 2 TIMES DAILY Patient taking differently: TAKE 2 TABLET 2 TIMES DAILY 10/19/16  Yes Burns, Claudina Lick, MD  methimazole (TAPAZOLE) 5 MG tablet Take 0.5 tablets (2.5 mg total) by mouth daily. Patient taking differently: Take 5 mg by mouth every other day.  11/18/16  Yes Philemon Kingdom, MD  Multiple Vitamins-Minerals (VISION-VITE PRESERVE PO) Take 1 tablet by mouth daily.    Yes [provider]  pantoprazole (PROTONIX) 40 MG tablet Take 1 tablet (40 mg total) by mouth daily. 02/05/17  Yes [name], MD  ramipril (ALTACE) 5 MG capsule Take 5 mg by mouth every evening.   Yes [provider]  amitriptyline (ELAVIL) 10 MG tablet TAKE 1 TABLET BY MOUTH EVERYDAY AT BEDTIME 02/11/17   Burns, Claudina Lick, MD  Blood Glucose Monitoring Suppl (ONE TOUCH ULTRA 2) w/Device KIT Use to check blood sugars twice a day Dx E11.9 01/07/16   Binnie Rail, MD  glucose blood (ONE TOUCH ULTRA TEST) test strip 1 each by Other route 2 (two) times daily. Use to check blood sugars twice a day 07/31/16   Binnie Rail, MD  Lancets [facility]  ULTRASOFT) lancets 1 each by Other route 2 (two) times daily. Use to help check blood sugars twice a day 07/31/16   Binnie Rail, MD     All other systems have been reviewed and were otherwise negative with  the exception of those mentioned in the HPI and as above.  Physical Exam: There were no vitals filed for this visit.  General: Alert, no acute distress Cardiovascular: No pedal edema Respiratory: No cyanosis, no use of accessory musculature Skin: No lesions in the area of chief complaint Neurologic: Sensation intact distally Psychiatric: Patient is competent for consent with normal mood and affect Lymphatic: No axillary or cervical lymphadenopathy  MUSCULOSKELETAL: + spurling on the left  Assessment/Plan: Left arm pain secondary to metastatic disease extending into the left C6/7 foramen Plan for Procedure(s): ANTERIOR CERVICAL DECOMPRESSION FUSION, CERVICAL 6-7 WITH INSTRUMENTATION AND ALLOGRAFT (to be followed by a PCDF C5-T2 tomorrow)   Sinclair Ship, MD 02/17/2017 6:42 AM

## 2017-02-17 NOTE — Progress Notes (Signed)
Called Dr. Lynann Bologna office about neck brace.  Patient has been asking about neck brace and being fitted before surgery.  Office stated that [facility] PA has the brace

## 2017-02-17 NOTE — Anesthesia Postprocedure Evaluation (Signed)
Anesthesia Post Note  Patient: Samantha Quinn  Procedure(s) Performed: ANTERIOR CERVICAL DECOMPRESSION FUSION, CERVICAL 6-7 WITH INSTRUMENTATION AND ALLOGRAFT (N/A Neck)     Patient location during evaluation: PACU Anesthesia Type: General Level of consciousness: awake and alert Pain management: pain level controlled Vital Signs Assessment: post-procedure vital signs reviewed and stable Respiratory status: spontaneous breathing, nonlabored ventilation, respiratory function stable and patient connected to nasal cannula oxygen Cardiovascular status: blood pressure returned to baseline and stable Postop Assessment: no apparent nausea or vomiting Anesthetic complications: no    Last Vitals:  Vitals:   02/17/17 1630 02/17/17 1645  BP:    Pulse: 71 68  Resp: 19 16  Temp:    SpO2: 99% 98%    Last Pain:  Vitals:   02/17/17 1558  TempSrc:   PainSc: 6                  Keene Gilkey DAVID

## 2017-02-17 NOTE — Progress Notes (Cosign Needed)
We spoke with Dr. Antonieta Pert office today regarding the pt being on decadron. Per their instruction we will wean pt off of her decadron as follows:  Take 1 pill with food TID times a day PO DAY 1 Take 1 pill with food BID times a day PO DAY 2 Take 1 pill with food QD PO DAY 3  D/C decadron PO DAY 4  Pricilla Holm, PA-C [facility]

## 2017-02-18 ENCOUNTER — Inpatient Hospital Stay (HOSPITAL_COMMUNITY): Admission: RE | Admit: 2017-02-18 | Payer: Medicare Other | Source: Ambulatory Visit | Admitting: Orthopedic Surgery

## 2017-02-18 ENCOUNTER — Ambulatory Visit: Payer: Medicare Other | Admitting: Physical Therapy

## 2017-02-18 ENCOUNTER — Inpatient Hospital Stay (HOSPITAL_COMMUNITY): Payer: Medicare Other | Admitting: Anesthesiology

## 2017-02-18 ENCOUNTER — Inpatient Hospital Stay (HOSPITAL_COMMUNITY)
Admission: RE | Disposition: A | Discharge status: Home / Self Care | Payer: Self-pay | Source: Ambulatory Visit | Attending: Orthopedic Surgery

## 2017-02-18 ENCOUNTER — Inpatient Hospital Stay (HOSPITAL_COMMUNITY): Payer: Medicare Other

## 2017-02-18 ENCOUNTER — Encounter (HOSPITAL_COMMUNITY): Payer: Self-pay | Admitting: Orthopedic Surgery

## 2017-02-18 HISTORY — PX: POSTERIOR CERVICAL FUSION/FORAMINOTOMY: SHX5038

## 2017-02-18 LAB — GLUCOSE, CAPILLARY
Glucose-Capillary: 248 mg/dL — ABNORMAL HIGH (ref 65–99)
Glucose-Capillary: 236 mg/dL — ABNORMAL HIGH (ref 65–99)
Glucose-Capillary: 269 mg/dL — ABNORMAL HIGH (ref 65–99)

## 2017-02-18 SURGERY — POSTERIOR CERVICAL FUSION/FORAMINOTOMY LEVEL 4
Anesthesia: General | Site: Spine Cervical

## 2017-02-18 MED ORDER — ACETAMINOPHEN 650 MG RE SUPP
650.0000 mg | RECTAL | Status: DC | PRN
Start: 2017-02-18 — End: 2017-02-19

## 2017-02-18 MED ORDER — ONDANSETRON HCL 4 MG PO TABS: 4.0000 mg | ORAL_TABLET | Freq: Four times a day (QID) | ORAL | Status: DC | PRN

## 2017-02-18 MED ORDER — PROMETHAZINE HCL 25 MG/ML IJ SOLN: 6.2500 mg | INTRAMUSCULAR | Status: DC | PRN

## 2017-02-18 MED ORDER — SUGAMMADEX SODIUM 200 MG/2ML IV SOLN
INTRAVENOUS | Status: DC | PRN
  Administered 2017-02-18: 109.8 mg via INTRAVENOUS

## 2017-02-18 MED ORDER — MENTHOL 3 MG MT LOZG: 1.0000 | LOZENGE | OROMUCOSAL | Status: DC | PRN

## 2017-02-18 MED ORDER — INSULIN ASPART 100 UNIT/ML ~~LOC~~ SOLN
4.0000 [IU] | Freq: Once | SUBCUTANEOUS | Status: AC
  Administered 2017-02-18: 4 [IU] via SUBCUTANEOUS

## 2017-02-18 MED ORDER — THROMBIN (RECOMBINANT) 5000 UNITS EX SOLR
CUTANEOUS | Status: AC
  Filled 2017-02-18: qty 5000

## 2017-02-18 MED ORDER — PROPOFOL 10 MG/ML IV BOLUS
INTRAVENOUS | Status: DC | PRN
  Administered 2017-02-18: 100 mg via INTRAVENOUS

## 2017-02-18 MED ORDER — DEXMEDETOMIDINE HCL IN NACL 200 MCG/50ML IV SOLN
INTRAVENOUS | Status: DC | PRN
  Administered 2017-02-18: 16 ug via INTRAVENOUS

## 2017-02-18 MED ORDER — SODIUM CHLORIDE 0.9% FLUSH: 3.0000 mL | INTRAVENOUS | Status: DC | PRN

## 2017-02-18 MED ORDER — BUPIVACAINE LIPOSOME 1.3 % IJ SUSP
20.0000 mL | Freq: Once | INTRAMUSCULAR | Status: DC
  Filled 2017-02-18: qty 20

## 2017-02-18 MED ORDER — DIAZEPAM 2 MG PO TABS
2.0000 mg | ORAL_TABLET | Freq: Four times a day (QID) | ORAL | Status: DC | PRN
  Administered 2017-02-18 – 2017-02-19 (×4): 2 mg via ORAL
  Filled 2017-02-18 (×4): qty 1

## 2017-02-18 MED ORDER — INSULIN ASPART 100 UNIT/ML ~~LOC~~ SOLN
0.0000 [IU] | Freq: Three times a day (TID) | SUBCUTANEOUS | Status: DC
  Administered 2017-02-19: 3 [IU] via SUBCUTANEOUS
  Administered 2017-02-19: 2 [IU] via SUBCUTANEOUS

## 2017-02-18 MED ORDER — SODIUM CHLORIDE 0.9% FLUSH: 3.0000 mL | Freq: Two times a day (BID) | INTRAVENOUS | Status: DC

## 2017-02-18 MED ORDER — DOCUSATE SODIUM 100 MG PO CAPS
100.0000 mg | ORAL_CAPSULE | Freq: Two times a day (BID) | ORAL | Status: DC
  Administered 2017-02-19: 100 mg via ORAL
  Filled 2017-02-18 (×2): qty 1

## 2017-02-18 MED ORDER — FENTANYL CITRATE (PF) 100 MCG/2ML IJ SOLN
25.0000 ug | INTRAMUSCULAR | Status: DC | PRN
  Administered 2017-02-18 (×2): 25 ug via INTRAVENOUS

## 2017-02-18 MED ORDER — TRANEXAMIC ACID 1000 MG/10ML IV SOLN
INTRAVENOUS | Status: AC | PRN
  Administered 2017-02-18: 2000 mg via TOPICAL

## 2017-02-18 MED ORDER — DEXAMETHASONE SODIUM PHOSPHATE 10 MG/ML IJ SOLN
INTRAMUSCULAR | Status: DC | PRN
  Administered 2017-02-18: 4 mg via INTRAVENOUS

## 2017-02-18 MED ORDER — LACTATED RINGERS IV SOLN
INTRAVENOUS | Status: DC | PRN
  Administered 2017-02-18: 08:00:00 via INTRAVENOUS

## 2017-02-18 MED ORDER — ROCURONIUM BROMIDE 10 MG/ML (PF) SYRINGE
PREFILLED_SYRINGE | INTRAVENOUS | Status: DC | PRN
  Administered 2017-02-18: 50 mg via INTRAVENOUS
  Administered 2017-02-18: 30 mg via INTRAVENOUS
  Administered 2017-02-18 (×2): 20 mg via INTRAVENOUS

## 2017-02-18 MED ORDER — TRANEXAMIC ACID 1000 MG/10ML IV SOLN
2000.0000 mg | Freq: Once | INTRAVENOUS | Status: DC
  Filled 2017-02-18: qty 20

## 2017-02-18 MED ORDER — POTASSIUM CHLORIDE IN NACL 20-0.9 MEQ/L-% IV SOLN
INTRAVENOUS | Status: DC
Start: 2017-02-18 — End: 2017-02-19

## 2017-02-18 MED ORDER — ALUM & MAG HYDROXIDE-SIMETH 200-200-20 MG/5ML PO SUSP: 30.0000 mL | Freq: Four times a day (QID) | ORAL | Status: DC | PRN

## 2017-02-18 MED ORDER — ZOLPIDEM TARTRATE 5 MG PO TABS: 5.0000 mg | ORAL_TABLET | Freq: Every evening | ORAL | Status: DC | PRN

## 2017-02-18 MED ORDER — SENNOSIDES-DOCUSATE SODIUM 8.6-50 MG PO TABS: 1.0000 | ORAL_TABLET | Freq: Every evening | ORAL | Status: DC | PRN

## 2017-02-18 MED ORDER — LACTATED RINGERS IV SOLN
INTRAVENOUS | Status: DC | PRN
  Administered 2017-02-18 (×2): via INTRAVENOUS

## 2017-02-18 MED ORDER — BACITRACIN ZINC 500 UNIT/GM EX OINT
TOPICAL_OINTMENT | CUTANEOUS | Status: DC | PRN
  Administered 2017-02-18: 1 via TOPICAL

## 2017-02-18 MED ORDER — DEXMEDETOMIDINE HCL IN NACL 200 MCG/50ML IV SOLN
INTRAVENOUS | Status: DC | PRN
  Administered 2017-02-18: .4 ug/kg/h via INTRAVENOUS

## 2017-02-18 MED ORDER — ONDANSETRON HCL 4 MG/2ML IJ SOLN
INTRAMUSCULAR | Status: DC | PRN
  Administered 2017-02-18: 4 mg via INTRAVENOUS

## 2017-02-18 MED ORDER — PANTOPRAZOLE SODIUM 40 MG IV SOLR: 40.0000 mg | Freq: Every day | INTRAVENOUS | Status: DC

## 2017-02-18 MED ORDER — ONDANSETRON HCL 4 MG/2ML IJ SOLN: 4.0000 mg | Freq: Four times a day (QID) | INTRAMUSCULAR | Status: DC | PRN

## 2017-02-18 MED ORDER — ARTIFICIAL TEARS OPHTHALMIC OINT
TOPICAL_OINTMENT | OPHTHALMIC | Status: DC | PRN
  Administered 2017-02-18: 1 via OPHTHALMIC

## 2017-02-18 MED ORDER — BACITRACIN ZINC 500 UNIT/GM EX OINT
TOPICAL_OINTMENT | CUTANEOUS | Status: AC
  Filled 2017-02-18: qty 28.35

## 2017-02-18 MED ORDER — BUPIVACAINE LIPOSOME 1.3 % IJ SUSP
INTRAMUSCULAR | Status: DC | PRN
  Administered 2017-02-18: 20 mL

## 2017-02-18 MED ORDER — CEFAZOLIN SODIUM-DEXTROSE 2-4 GM/100ML-% IV SOLN
2.0000 g | Freq: Three times a day (TID) | INTRAVENOUS | Status: AC
  Administered 2017-02-18 (×2): 2 g via INTRAVENOUS
  Filled 2017-02-18 (×2): qty 100

## 2017-02-18 MED ORDER — FENTANYL CITRATE (PF) 250 MCG/5ML IJ SOLN
INTRAMUSCULAR | Status: DC | PRN
  Administered 2017-02-18: 50 ug via INTRAVENOUS

## 2017-02-18 MED ORDER — BISACODYL 5 MG PO TBEC: 5.0000 mg | DELAYED_RELEASE_TABLET | Freq: Every day | ORAL | Status: DC | PRN

## 2017-02-18 MED ORDER — INSULIN ASPART 100 UNIT/ML ~~LOC~~ SOLN
SUBCUTANEOUS | Status: AC
  Filled 2017-02-18: qty 1

## 2017-02-18 MED ORDER — ACETAMINOPHEN 325 MG PO TABS
650.0000 mg | ORAL_TABLET | ORAL | Status: DC | PRN
  Administered 2017-02-18: 650 mg via ORAL
  Filled 2017-02-18: qty 2

## 2017-02-18 MED ORDER — FLEET ENEMA 7-19 GM/118ML RE ENEM: 1.0000 | ENEMA | Freq: Once | RECTAL | Status: DC | PRN

## 2017-02-18 MED ORDER — PHENYLEPHRINE 40 MCG/ML (10ML) SYRINGE FOR IV PUSH (FOR BLOOD PRESSURE SUPPORT)
PREFILLED_SYRINGE | INTRAVENOUS | Status: DC | PRN
  Administered 2017-02-18: 40 ug via INTRAVENOUS
  Administered 2017-02-18 (×2): 80 ug via INTRAVENOUS
  Administered 2017-02-18: 40 ug via INTRAVENOUS
  Administered 2017-02-18 (×2): 80 ug via INTRAVENOUS

## 2017-02-18 MED ORDER — THROMBIN (RECOMBINANT) 20000 UNITS EX SOLR
CUTANEOUS | Status: DC | PRN
  Administered 2017-02-18: 10000 [IU] via TOPICAL
  Administered 2017-02-18: 5000 [IU] via TOPICAL

## 2017-02-18 MED ORDER — INSULIN ASPART 100 UNIT/ML ~~LOC~~ SOLN
0.0000 [IU] | Freq: Every day | SUBCUTANEOUS | Status: DC
  Administered 2017-02-18: 3 [IU] via SUBCUTANEOUS

## 2017-02-18 MED ORDER — LIDOCAINE 2% (20 MG/ML) 5 ML SYRINGE
INTRAMUSCULAR | Status: DC | PRN
  Administered 2017-02-18: 60 mg via INTRAVENOUS

## 2017-02-18 MED ORDER — BUPIVACAINE-EPINEPHRINE 0.25% -1:200000 IJ SOLN
INTRAMUSCULAR | Status: DC | PRN
  Administered 2017-02-18: 30 mL

## 2017-02-18 MED ORDER — BUPIVACAINE-EPINEPHRINE (PF) 0.25% -1:200000 IJ SOLN
INTRAMUSCULAR | Status: AC
  Filled 2017-02-18: qty 30

## 2017-02-18 MED ORDER — PHENOL 1.4 % MT LIQD: 1.0000 | OROMUCOSAL | Status: DC | PRN

## 2017-02-18 MED ORDER — DIAZEPAM 5 MG PO TABS: 5.0000 mg | ORAL_TABLET | Freq: Four times a day (QID) | ORAL | Status: DC | PRN

## 2017-02-18 SURGICAL SUPPLY — 91 items
BENZOIN TINCTURE PRP APPL 2/3 (GAUZE/BANDAGES/DRESSINGS) ×3 IMPLANT
BIT DRILL MOUNTAINEER FIX 14 (BIT) ×1
BIT DRILL MOUNTAINEER FIX 14MM (BIT) ×1 IMPLANT
BIT DRILL MOUNTAINER FXED 12MM (DRILL) ×1 IMPLANT
BLADE CLIPPER SURG NEURO (BLADE) IMPLANT
BONE VIVIGEN FORMABLE 5.4CC (Bone Implant) ×3 IMPLANT
BUR NEURO DRILL SOFT 3.0X3.8M (BURR) ×3 IMPLANT
BUR PRESCISION 1.7 ELITE (BURR) ×3 IMPLANT
CLOSURE STERI-STRIP 1/2X4 (GAUZE/BANDAGES/DRESSINGS) ×1
CLOSURE WOUND 1/2 X4 (GAUZE/BANDAGES/DRESSINGS)
CLSR STERI-STRIP ANTIMIC 1/2X4 (GAUZE/BANDAGES/DRESSINGS) ×2 IMPLANT
CONT SPEC 4OZ CLIKSEAL STRL BL (MISCELLANEOUS) ×6 IMPLANT
CORDS BIPOLAR (ELECTRODE) ×3 IMPLANT
COVER BACK TABLE 80X110 HD (DRAPES) ×3 IMPLANT
COVER SURGICAL LIGHT HANDLE (MISCELLANEOUS) ×3 IMPLANT
DRAIN CHANNEL 10F 3/8 F FF (DRAIN) IMPLANT
DRAIN CHANNEL 15F RND FF W/TCR (WOUND CARE) IMPLANT
DRAIN HEMOVAC 1/8 X 5 (WOUND CARE) IMPLANT
DRAPE C-ARM 42X72 X-RAY (DRAPES) IMPLANT
DRAPE HALF SHEET 40X57 (DRAPES) ×15 IMPLANT
DRAPE INCISE IOBAN 66X45 STRL (DRAPES) ×3 IMPLANT
DRAPE PED LAPAROTOMY (DRAPES) ×3 IMPLANT
DRAPE POUCH INSTRU U-SHP 10X18 (DRAPES) ×3 IMPLANT
DRAPE SURG 17X23 STRL (DRAPES) ×15 IMPLANT
DRAPE UNIVERSAL PACK (DRAPES) ×3 IMPLANT
DRILL BIT MOUNTAINEER FIX 14MM (BIT) ×2
DRILL MOUNTAINEER FIXED 12MM (DRILL) ×3
DRSG MEPILEX BORDER 4X8 (GAUZE/BANDAGES/DRESSINGS) ×3 IMPLANT
DURAPREP 26ML APPLICATOR (WOUND CARE) ×3 IMPLANT
ELECT BLADE 4.0 EZ CLEAN MEGAD (MISCELLANEOUS) ×3
ELECT CAUTERY BLADE 6.4 (BLADE) ×3 IMPLANT
ELECT REM PT RETURN 9FT ADLT (ELECTROSURGICAL) ×3
ELECTRODE BLDE 4.0 EZ CLN MEGD (MISCELLANEOUS) ×1 IMPLANT
ELECTRODE REM PT RTRN 9FT ADLT (ELECTROSURGICAL) ×1 IMPLANT
EVACUATOR SILICONE 100CC (DRAIN) IMPLANT
GAUZE SPONGE 4X4 12PLY STRL (GAUZE/BANDAGES/DRESSINGS) ×3 IMPLANT
GAUZE SPONGE 4X4 16PLY XRAY LF (GAUZE/BANDAGES/DRESSINGS) ×3 IMPLANT
GLOVE BIO SURGEON STRL SZ7 (GLOVE) ×3 IMPLANT
GLOVE BIO SURGEON STRL SZ8 (GLOVE) ×3 IMPLANT
GLOVE BIOGEL PI IND STRL 7.5 (GLOVE) ×1 IMPLANT
GLOVE BIOGEL PI IND STRL 8 (GLOVE) ×1 IMPLANT
GLOVE BIOGEL PI INDICATOR 7.5 (GLOVE) ×2
GLOVE BIOGEL PI INDICATOR 8 (GLOVE) ×2
GOWN STRL REUS W/ TWL LRG LVL3 (GOWN DISPOSABLE) ×4 IMPLANT
GOWN STRL REUS W/ TWL XL LVL3 (GOWN DISPOSABLE) ×1 IMPLANT
GOWN STRL REUS W/TWL LRG LVL3 (GOWN DISPOSABLE) ×8
GOWN STRL REUS W/TWL XL LVL3 (GOWN DISPOSABLE) ×2
IV CATH 14GX2 1/4 (CATHETERS) ×3 IMPLANT
KIT BASIN OR (CUSTOM PROCEDURE TRAY) ×3 IMPLANT
KIT ROOM TURNOVER OR (KITS) ×3 IMPLANT
NEEDLE HYPO 25GX1X1/2 BEV (NEEDLE) ×3 IMPLANT
NEEDLE PRECISIONGLIDE 27X1.5 (NEEDLE) ×3 IMPLANT
NS IRRIG 1000ML POUR BTL (IV SOLUTION) ×3 IMPLANT
NUT HH X CONN OUTER (Orthopedic Implant) ×12 IMPLANT
PACK LAMINECTOMY ORTHO (CUSTOM PROCEDURE TRAY) ×3 IMPLANT
PAD ARMBOARD 7.5X6 YLW CONV (MISCELLANEOUS) ×6 IMPLANT
PATTIES SURGICAL .5 X.5 (GAUZE/BANDAGES/DRESSINGS) ×3 IMPLANT
PATTIES SURGICAL .5 X1 (DISPOSABLE) ×3 IMPLANT
PATTIES SURGICAL .5 X3 (DISPOSABLE) IMPLANT
PATTIES SURGICAL .5X1.5 (GAUZE/BANDAGES/DRESSINGS) IMPLANT
PIN MAYFIELD SKULL DISP (PIN) ×3 IMPLANT
PLATE HH X CONN 28MM (Plate) ×3 IMPLANT
PUTTY DBX 1CC (Putty) ×3 IMPLANT
PUTTY DBX 1CC DEPUY (Putty) ×1 IMPLANT
ROD MOUNTAINEER 120MM (Rod) ×3 IMPLANT
ROD TEMPLATE MOUNTAINEER 120MM (ROD) ×6 IMPLANT
SCREW F A 3.5X12 (Screw) ×3 IMPLANT
SCREW F A 3.5X14 (Screw) ×9 IMPLANT
SCREW HH X CONN INNER (Screw) ×12 IMPLANT
SCREW INNER (Screw) ×21 IMPLANT
SCREW MTN 4.35X30MM M/L (Screw) ×12 IMPLANT
SPONGE INTESTINAL PEANUT (DISPOSABLE) ×3 IMPLANT
SPONGE SURGIFOAM ABS GEL 100 (HEMOSTASIS) ×3 IMPLANT
STRIP CLOSURE SKIN 1/2X4 (GAUZE/BANDAGES/DRESSINGS) IMPLANT
SURGIFLO W/THROMBIN 8M KIT (HEMOSTASIS) IMPLANT
SUT MNCRL AB 4-0 PS2 18 (SUTURE) ×3 IMPLANT
SUT VIC AB 0 CT1 18XCR BRD 8 (SUTURE) ×2 IMPLANT
SUT VIC AB 0 CT1 8-18 (SUTURE) ×4
SUT VIC AB 1 CT1 18XCR BRD 8 (SUTURE) ×2 IMPLANT
SUT VIC AB 1 CT1 8-18 (SUTURE) ×4
SUT VIC AB 2-0 CT2 18 VCP726D (SUTURE) ×6 IMPLANT
SYR 20CC LL (SYRINGE) ×3 IMPLANT
SYR BULB IRRIGATION 50ML (SYRINGE) ×3 IMPLANT
SYR CONTROL 10ML LL (SYRINGE) ×3 IMPLANT
TAPE CLOTH 4X10 WHT NS (GAUZE/BANDAGES/DRESSINGS) ×3 IMPLANT
TAPE CLOTH SURG 4X10 WHT LF (GAUZE/BANDAGES/DRESSINGS) ×3 IMPLANT
TOWEL OR 17X24 6PK STRL BLUE (TOWEL DISPOSABLE) ×3 IMPLANT
TOWEL OR 17X26 10 PK STRL BLUE (TOWEL DISPOSABLE) ×3 IMPLANT
TRAY FOLEY W/METER SILVER 16FR (SET/KITS/TRAYS/PACK) ×3 IMPLANT
WATER STERILE IRR 1000ML POUR (IV SOLUTION) ×3 IMPLANT
YANKAUER SUCT BULB TIP NO VENT (SUCTIONS) ×6 IMPLANT

## 2017-02-18 NOTE — Progress Notes (Signed)
Dr Deatra Canter aware repeat CBG 236. No new orders. OK to tx to room.

## 2017-02-18 NOTE — Op Note (Signed)
NAME:  Samantha, Quinn                 ACCOUNT NO.:  [id number]  MEDICAL RECORD NO.:  [id number]  PHYSICIAN:  Phylliss Bob, MD           DATE OF BIRTH:  DATE OF PROCEDURE:  02/17/2017                               OPERATIVE REPORT   PREOPERATIVE DIAGNOSIS:  Metastatic lung cancer to the left C7 lateral mass, extending into the left C6-C7 neuroforamen, causing left-sided C7 radiculopathy.  POSTOPERATIVE DIAGNOSIS:  Metastatic lung cancer to the left C7 lateral Mass, extending into the left C6-C7 neuroforamen, causing left-sided C7 radiculopathy.  PROCEDURE PERFORMED:  (stage 1 of 2). 1. Anterior cervical decompression and fusion, C6-C7. 2. Placement of anterior instrumentation, C6-C7. 3. Insertion of interbody device x1 (6 mm Titan intervertebral     spacer). 4. Use of morselized allograft - ViviGen. 5. Intraoperative use of fluoroscopy.  SURGEON:  Phylliss Bob, MD.  ASSISTANTPricilla Holm, PA-C.  ANESTHESIA:  General endotracheal anesthesia.  COMPLICATIONS:  None.  DISPOSITION:  Stable.  ESTIMATED BLOOD LOSS:  Minimal.  INDICATIONS FOR SURGERY:  Briefly, Samantha Quinn is a very pleasant 83 year old female who did present to me with rather debilitating pain in her left arm.  The patient does carry a diagnosis of lung cancer.  On the patient's imaging studies, it was clear that there was metastatic lung cancer extending into her left lateral mass.  This was causing left- sided C7 radiculopathy.  Given the patient's ongoing pain and dysfunction, we did discuss proceeding with a two-staged procedure, starting with stage I as noted above.  The plan is to bring the patient back for stage II the following day for a posterior decompression and fusion procedure.  The patient was made fully aware of the risks and limitations of surgery and did wish to proceed.  OPERATIVE DETAILS:  On February 17, 2017, the patient was brought to surgery and general endotracheal anesthesia was  administered.  The patient was placed supine on the hospital bed.  The patient's neck was gently extended.  Her arms were secured to her sides and all bony prominences were meticulously padded.  The neck was prepped and draped in the usual sterile fashion.  A time-out procedure was performed.  At this point, a left-sided transverse incision was made overlying the C6- C7 intervertebral space.  The platysma was incised.  A Smith-Robinson approach was performed and the anterior spine was identified.  The vertebral bodies of C6 and C7 were identified and subperiosteally exposed.  A self-retaining retractor was placed.  Caspar pins were then placed into the C6 and C7 vertebral bodies and distraction was then applied across the C6-C7 intervertebral space.  At this point, a thorough and complete C6-C7 intervertebral diskectomy was performed.  I did meticulously decompress both the right and left foramina at the C6- C7 level.  The endplates were then prepared.  At this point, the appropriate size intervertebral spacer was packed with ViviGen and tamped into position in the usual fashion.  I was very pleased with the press-fit of the implant.  A 12 mm anterior cervical plate was then placed over the anterior spine, 12 mm variable angle screws were placed, 2 in each vertebral body at C6 and C7.  The screws were then locked to the plate using the Cam  locking mechanism.  I was very pleased with the final construct, noted on the fluoroscopic images.  The wound was then irrigated.  The platysma was then closed using 2-0 Vicryl and the skin was closed using 4-0 Monocryl.  Benzoin and Steri-Strips were applied followed by sterile dressing.  All instrument counts were correct at the termination of the procedure.  Of note, Pricilla Holm, was my assistant throughout the surgery, and did aid in retraction, suctioning, and closure throughout the surgery.     Phylliss Bob,  MD   ______________________________ Phylliss Bob, MD    MD/MEDQ  D:  02/17/2017  T:  02/17/2017  Job:  [id number]

## 2017-02-18 NOTE — Anesthesia Procedure Notes (Signed)
Procedure Name: Intubation Date/Time: 02/18/2017 7:40 AM Performed by: Freddie Breech Pre-anesthesia Checklist: Patient identified, Emergency Drugs available, Suction available and Patient being monitored Patient Re-evaluated:Patient Re-evaluated prior to induction Oxygen Delivery Method: Circle System Utilized Preoxygenation: Pre-oxygenation with 100% oxygen Induction Type: IV induction Ventilation: Mask ventilation without difficulty Laryngoscope Size: Glidescope and 3 Grade View: Grade II Tube type: Oral Tube size: 7.0 mm Number of attempts: 1 Airway Equipment and Method: Stylet and Oral airway Placement Confirmation: ETT inserted through vocal cords under direct vision,  positive ETCO2 and breath sounds checked- equal and bilateral Secured at: 21 cm Tube secured with: Tape Dental Injury: Teeth and Oropharynx as per pre-operative assessment

## 2017-02-18 NOTE — Addendum Note (Signed)
Addendum  created 02/18/17 1252 by Josephine Igo, MD   Anesthesia Attestations filed, Anesthesia Staff edited

## 2017-02-18 NOTE — Anesthesia Postprocedure Evaluation (Signed)
Anesthesia Post Note  Patient: Samantha Quinn  Procedure(s) Performed: CERVICAL Five-Six, CERVICAL Six-Seven, CERVICAL Seven-THORACIC One, THORACIC One-Two POSTERIOR SPINAL FUSION WITH INSTRUMENTATION AND ALLOGRAFT (N/A Spine Cervical)     Patient location during evaluation: PACU Anesthesia Type: General Level of consciousness: awake and alert Pain management: pain level controlled Vital Signs Assessment: post-procedure vital signs reviewed and stable Respiratory status: spontaneous breathing, nonlabored ventilation, respiratory function stable and patient connected to nasal cannula oxygen Cardiovascular status: blood pressure returned to baseline and stable Postop Assessment: no apparent nausea or vomiting Anesthetic complications: no    Last Vitals:  Vitals:   02/18/17 1322 02/18/17 1339  BP: (!) 151/73 (!) 144/71  Pulse: 79 80  Resp: 18 17  Temp:    SpO2: 97% 96%    Last Pain:  Vitals:   02/18/17 1339  TempSrc:   PainSc: 4                  Tiajuana Amass

## 2017-02-18 NOTE — Progress Notes (Signed)
Orthopedic Tech Progress Note Patient Details:  Samantha Quinn [birth year] [id number]  Ortho Devices Type of Ortho Device: [place] cervical collar Ortho Device/Splint Interventions: Application   [name] 02/18/2017, 3:50 PM

## 2017-02-18 NOTE — Transfer of Care (Signed)
Immediate Anesthesia Transfer of Care Note  Patient: CLOVA MORLOCK  Procedure(s) Performed: CERVICAL Five-Six, CERVICAL Six-Seven, CERVICAL Seven-THORACIC One, THORACIC One-Two POSTERIOR SPINAL FUSION WITH INSTRUMENTATION AND ALLOGRAFT (N/A Spine Cervical)  Patient Location: PACU  Anesthesia Type:General  Level of Consciousness: drowsy and patient cooperative  Airway & Oxygen Therapy: Patient Spontanous Breathing and Patient connected to face mask oxygen  Post-op Assessment: Report given to RN and Post -op Vital signs reviewed and stable  Post vital signs: Reviewed and stable  Last Vitals:  Vitals:   02/18/17 0100 02/18/17 0541  BP: 138/64 (!) 130/58  Pulse: 78 73  Resp: 16 20  Temp: 36.7 C 36.7 C  SpO2: 100% 99%    Last Pain:  Vitals:   02/18/17 0541  TempSrc: Oral  PainSc:       Patients Stated Pain Goal: 4 (91/66/06 0045)  Complications: No apparent anesthesia complications

## 2017-02-18 NOTE — H&P (Signed)
Patient tolerated stage 1 of her procedure very well yesterday, and presents for stage 2 today (PSF and decompression spanning C5-T2). Will proceed as planned.

## 2017-02-19 LAB — GLUCOSE, CAPILLARY
GLUCOSE-CAPILLARY: 228 mg/dL — AB (ref 65–99)
Glucose-Capillary: 178 mg/dL — ABNORMAL HIGH (ref 65–99)

## 2017-02-19 MED FILL — Thrombin (Recombinant) For Soln 5000 Unit: CUTANEOUS | Qty: 5000 | Status: AC

## 2017-02-19 NOTE — Progress Notes (Signed)
    Patient doing well Pain has been minimal Patient denies arm pain Has been ambulating   Physical Exam: Vitals:   02/18/17 2330 02/19/17 0519  BP: (!) 144/91 131/67  Pulse: (!) 109 93  Resp: 16 16  Temp: 100.1 F (37.8 C) 98.2 F (36.8 C)  SpO2: 98% 100%   Patient looks excellent Collar fitting well Dressing in place NVI  POD #1 s/p A/P cervical decompression and fusion C5-T2  - encourage ambulation - Percocet for pain, Valium for muscle spasms - likely d/c home later today with f/u in 2 weeks

## 2017-02-19 NOTE — Op Note (Signed)
NAME:  Samantha, Quinn                 ACCOUNT NO.:  [id number]  MEDICAL RECORD NO.:  [id number]  PHYSICIAN:  Phylliss Bob, MD           DATE OF BIRTH:  DATE OF PROCEDURE:  02/18/2017                              OPERATIVE REPORT   PREOPERATIVE DIAGNOSIS:  Metastatic lung cancer to the C7 lateral mass and vertebral body, resulting in prominent neuroforaminal narrowing on the left at C6-7, compressing the left C7 nerve.  The patient is status post an anterior cervical fusion yesterday, requiring a posterior fusion today.  POSTOPERATIVE DIAGNOSIS:  Metastatic lung cancer to the C7 lateral mass and vertebral body, resulting in prominent neuroforaminal narrowing on the left at C6-7, compressing the left C7 nerve.  The patient is status post an anterior cervical fusion yesterday, requiring a posterior fusion today.  PROCEDURES (stage 2 of 2): 1. Left-sided cervical decompression, C6-7, with complex removal of     the patient's left lateral mass with removal of the patient's left     C7 pedicle and part of the vertebral body secondary to metastatic     lung cancer. 2. Posterior spinal fusion, C5-6, C6-7, C7-T1, T1-T2. 3. Placement of posterior segmental instrumentation, C5 to T1. 4. Use of morselized allograft - ViviGen. 5. Cranial tong application and removal. 6. Intraoperative use of fluoroscopy.  SURGEON:  Phylliss Bob, MD.  ASSISTANTPricilla Holm, PA-C.  ANESTHESIA:  General endotracheal anesthesia.  COMPLICATIONS:  None.  DISPOSITION:  Stable.  ESTIMATED BLOOD LOSS:  Minimal.  INDICATIONS FOR SURGERY:  Briefly, Samantha Quinn is a very pleasant 83 year old female who was referred with metastatic cancer into her left C7 vertebral body.  The patient's imaging studies were consistent with this.  She did have ongoing left arm pain.  She did have an ACDF procedure on February 17, 2017, and did present for stage II of her procedure.  Please refer to my operative report dated  February 17, 2017, for a full account of the indications for the patient's surgery yesterday and today.  OPERATIVE DETAILS:  On February 18, 2017, the patient was brought to Surgery and general endotracheal anesthesia was administered.  The [place] head holder was applied and the patient was placed and was rolled prone onto a hospital bed.  The patient's neck was appropriately positioned.  The patient's arms were secured to her sides and all bony prominences were meticulously padded.  The neck was then prepped and draped in the usual sterile fashion.  A time-out procedure was performed.  I then made a midline incision spanning approximately C4 to T2.  The fascia was incised at the midline.  A lateral intraoperative fluoroscopic view did confirm the appropriate operative levels.  I then subperiosteally exposed the posterior elements from C5 to T2.  I then cannulated the C5 and C6 lateral masses bilaterally.  The left C6 lateral mass was noted to be very small and not amenable to screw placement.  I tapped up to a 3 mm tap into the C5 and C6 lateral masses. Using intraoperative fluoroscopy, I did cannulate the T1 and T2 pedicles using anatomic landmarks in addition to fluoroscopy.  I then tapped the pedicles using a 3.5 mm tap.  A ball-tipped probe was used to confirm that there was no cortical  violation either laterally, medially, or anteriorly through the anterior vertebral body.  I then proceeded with  the decompression aspect of the procedure. The left C6/7 level was noted and clearly, there was abnormal appearing bone associated with the C7 lateral mass. I did partially removed the medial aspect of C6, and subsequently, I did remove all abnormal bone, which included the C7 spinous process, lateral mass, pedicle, and a portion of the the left C7 vertebral  body. I removed as much as the abnormal appearing bone as safely possible. Part of the bone was sent to pathology for surgery  testing.  In removing the bone I was able to fully decompress the exiting left C7 nerve entirely. At this point, I did corticate the facet joints to be fused from C5 to T2 and I did decorticate the posterior elements.  The wound was copiously irrigated with a total of approximately 2 L of normal saline.  At this point, I proceeded with placing 3.5 x 14 mm lateral mass screws at C5 and on the right at C6.  A 4.35 x 30 mm screws were placed into the T1 and T2 pedicles.  The tips of the screws were at the anterior margin of the vertebral body, which I did feel was a very safe and appropriate location for the screws.  I did feel that the purchase was excellent.  I then placed abundant ViviGen along the posterior elements and I did also pack the facet joints with DBX putty.  Rods were then used to connect the tulip heads of the screws on the right and left sides.  I was very pleased with the purchase of the screws and the construct.  Caps were then placed and a crosslink was placed at the level of T1.  I was very pleased with the final AP and lateral fluoroscopic images.  The wound was then closed in layers using #1 Vicryl followed by 2-0 Vicryl followed by 4-0 Monocryl.  Benzoin and Steri-Strips were applied followed by sterile dressing.  All instrument counts were correct at the termination of the procedure.  Of note, Pricilla Holm was my assistant throughout the surgery, and did aid in retraction, suctioning, and closure.     Phylliss Bob, MD   ______________________________ Phylliss Bob, MD    MD/MEDQ  D:  02/18/2017  T:  02/18/2017  Job:  [id number]

## 2017-02-19 NOTE — Therapy (Signed)
Occupational Therapy Evaluation Patient Details Name: Samantha Quinn MRN: [id number] DOB: 18-Feb-1928 Today's Date: 02/19/2017    History of Present Illness Pt is a 83 y.o. female s/p A?P cervical decompression and fusion C5-T2. PMH includes but not limited to anxiety, back pain, DM, HOH, pneumothorax of right lung after biopsy, hyperlipidemia, HTN, OA, osteoporosis.    Clinical Impression   Pt reports being independent in ADLs, IADLs and was driving PTA. Currently, pt requires min assist for UB/LB ADLs, and supervision for functional mobility and all other ADLs. Pt reports family is available to provide assistance as needed upon d/c home. Pt would benefit from acute OT services to increase independence with ADLs. OT will follow acutely to address established goals.     Follow Up Recommendations  No OT follow up;Supervision - Intermittent    Equipment Recommendations  3 in 1 bedside commode    Recommendations for Other Services       Precautions / Restrictions Precautions Precautions: Fall;Cervical Precaution Comments: Cervical precaution handout provided and reviewed with pt and daughter  Required Braces or Orthoses: Cervical Brace Cervical Brace: Hard collar;At all times Restrictions Weight Bearing Restrictions: No      Mobility Bed Mobility Overal bed mobility: Needs Assistance Bed Mobility: Rolling;Sidelying to Sit;Sit to Sidelying Rolling: Supervision Sidelying to sit: Supervision     Sit to sidelying: Supervision General bed mobility comments: VCs for techniques. No physical assistance needed  Transfers Overall transfer level: Needs assistance Equipment used: None Transfers: Sit to/from Stand Sit to Stand: Supervision         General transfer comment: no physical assist required , supervision for safety     Balance Overall balance assessment: Needs assistance Sitting-balance support: No upper extremity supported;Feet supported Sitting balance-Leahy Scale:  Good     Standing balance support: Single extremity supported;During functional activity Standing balance-Leahy Scale: Fair Standing balance comment: Single hand support during functional mobility, no LOB observed                            ADL either performed or assessed with clinical judgement   ADL Overall ADL's : Needs assistance/impaired Eating/Feeding: Modified independent   Grooming: Supervision/safety;Set up;Sitting   Upper Body Bathing: Minimal assistance;Sitting   Lower Body Bathing: Minimal assistance;Sit to/from stand   Upper Body Dressing : Minimal assistance;Sitting   Lower Body Dressing: Minimal assistance;Sit to/from stand   Toilet Transfer: Supervision/safety;Set up;Ambulation;Comfort height toilet   Toileting- Clothing Manipulation and Hygiene: Modified independent       Functional mobility during ADLs: Supervision/safety General ADL Comments: began education on compensatory techniques for ADLs with cervical precautions.      Vision         Perception     Praxis      Pertinent Vitals/Pain       Hand Dominance Right   Extremity/Trunk Assessment Upper Extremity Assessment Upper Extremity Assessment: Overall WFL for tasks assessed   Lower Extremity Assessment Lower Extremity Assessment: Defer to PT evaluation   Cervical / Trunk Assessment Cervical / Trunk Assessment: Other exceptions Cervical / Trunk Exceptions: s/p cervical surgery   Communication Communication Communication: No difficulties   Cognition Arousal/Alertness: Awake/alert Behavior During Therapy: WFL for tasks assessed/performed Overall Cognitive Status: Within Functional Limits for tasks assessed  General Comments  pts daughter present during evaluation and participated in education     Exercises     Shoulder Instructions      Home Living Family/patient expects to be discharged to:: Private  residence Living Arrangements: Alone;Children Available Help at Discharge: Family;Neighbor Type of Home: House (townhome) Home Access: Level entry     Home Layout: One level     Bathroom Shower/Tub: Walk-in shower;Door   [employer] Toilet: Handicapped height Bathroom Accessibility: Yes How Accessible: Accessible via walker Home Equipment: Shower seat - built in          Prior Functioning/Environment Level of Independence: Independent        Comments: Pt reports driving and managing all ADLs and IADLs at home         OT Problem List: Decreased knowledge of use of DME or AE;Decreased knowledge of precautions;Impaired balance (sitting and/or standing);Pain      OT Treatment/Interventions: Self-care/ADL training;DME and/or AE instruction;Therapeutic activities;Patient/family education;Balance training    OT Goals(Current goals can be found in the care plan section) Acute Rehab OT Goals Patient Stated Goal: To get better OT Goal Formulation: With patient Time For Goal Achievement: 03/05/17 Potential to Achieve Goals: Good ADL Goals Pt Will Perform Upper Body Bathing: with modified independence;sitting Pt Will Perform Upper Body Dressing: with modified independence;sitting Pt Will Perform Tub/Shower Transfer: with modified independence;ambulating;3 in 1  OT Frequency: Min 2X/week   Barriers to D/C:            Co-evaluation              AM-PAC PT "6 Clicks" Daily Activity     Outcome Measure Help from another person eating meals?: None Help from another person taking care of personal grooming?: A Little Help from another person toileting, which includes using toliet, bedpan, or urinal?: None Help from another person bathing (including washing, rinsing, drying)?: A Little Help from another person to put on and taking off regular upper body clothing?: A Little Help from another person to put on and taking off regular lower body clothing?: A Little 6 Click Score:  20   End of Session Equipment Utilized During Treatment: Gait belt Nurse Communication: Mobility status  Activity Tolerance: Patient tolerated treatment well Patient left: in bed;with call bell/phone within reach;with family/visitor present  OT Visit Diagnosis: Other abnormalities of gait and mobility (R26.89);Pain Pain - part of body:  (neck)                Time: 4401-0272 OT Time Calculation (min): 41 min Charges:    G-Codes:     Boykin Peek, OTS [phone]   Boykin Peek 02/19/2017, 10:12 AM

## 2017-02-19 NOTE — Evaluation (Signed)
Physical Therapy Evaluation Patient Details Name: Samantha Quinn MRN: [id number] DOB: Oct 19, 1927 Today's Date: 02/19/2017   History of Present Illness  Pt is a 83 y.o. female s/p A?P cervical decompression and fusion C5-T2. PMH includes but not limited to anxiety, back pain, DM, HOH, pneumothorax of right lung after biopsy, hyperlipidemia, HTN, OA, osteoporosis.   Clinical Impression  Pt admitted with above diagnosis. Pt currently with functional limitations due to the deficits listed below (see PT Problem List). At the time of PT eval pt was able to perform transfers with up to min assist and ambulation with min guard progressing to supervision for safety. Pt will have adequate support from family at d/c, and is planning on d/c home today. Pt will benefit from skilled PT to increase their independence and safety with mobility to allow discharge to the venue listed below.       Follow Up Recommendations No PT follow up;Supervision for mobility/OOB    Equipment Recommendations  3in1 (PT) (vs shower chair - family to decide and let RN know)    Recommendations for Other Services       Precautions / Restrictions Precautions Precautions: Fall;Cervical Precaution Comments: Cervical precaution handout provided and reviewed with pt and daughter  Required Braces or Orthoses: Cervical Brace Cervical Brace: Hard collar;At all times Restrictions Weight Bearing Restrictions: No      Mobility  Bed Mobility Overal bed mobility: Needs Assistance Bed Mobility: Rolling;Sidelying to Sit;Sit to Sidelying Rolling: Supervision Sidelying to sit: Min assist     Sit to sidelying: Supervision General bed mobility comments: Assist provided for trunk elevation to full sitting position as pt reports pain and weakness in neck and R shoulder.   Transfers Overall transfer level: Needs assistance Equipment used: None Transfers: Sit to/from Stand Sit to Stand: Supervision         General transfer  comment: no physical assist required , close supervision provided for safety   Ambulation/Gait Ambulation/Gait assistance: Min guard;Supervision Ambulation Distance (Feet): 200 Feet Assistive device: 1 person hand held assist;None Gait Pattern/deviations: Step-through pattern;Decreased stride length;Trunk flexed Gait velocity: Decreased Gait velocity interpretation: Below normal speed for age/gender General Gait Details: VC's for improved posture. Pt was able to initially ambulate with HHA for balance support and safety. She progressed to supervision with no UE support required.   Stairs            Wheelchair Mobility    Modified Rankin (Stroke Patients Only)       Balance Overall balance assessment: Needs assistance Sitting-balance support: No upper extremity supported;Feet supported Sitting balance-Leahy Scale: Good     Standing balance support: Single extremity supported;During functional activity Standing balance-Leahy Scale: Fair Standing balance comment: Single hand support during functional mobility, no LOB observed                              Pertinent Vitals/Pain Pain Assessment: Faces Faces Pain Scale: Hurts little more Pain Location: Neck when performing bed mobility.     Home Living Family/patient expects to be discharged to:: Private residence Living Arrangements: Alone;Children Available Help at Discharge: Family;Neighbor;Available 24 hours/day Type of Home: House (townhome) Home Access: Level entry     Home Layout: One level Home Equipment: Shower seat - built in      Prior Function Level of Independence: Independent         Comments: Pt reports driving and managing all ADLs and IADLs at home  Hand Dominance   Dominant Hand: Right    Extremity/Trunk Assessment   Upper Extremity Assessment Upper Extremity Assessment: Overall WFL for tasks assessed    Lower Extremity Assessment Lower Extremity Assessment:  Generalized weakness    Cervical / Trunk Assessment Cervical / Trunk Assessment: Other exceptions Cervical / Trunk Exceptions: s/p cervical surgery  Communication   Communication: No difficulties  Cognition Arousal/Alertness: Awake/alert Behavior During Therapy: WFL for tasks assessed/performed Overall Cognitive Status: Within Functional Limits for tasks assessed                                        General Comments General comments (skin integrity, edema, etc.): pts daughter present during evaluation and participated in education     Exercises     Assessment/Plan    PT Assessment Patient needs continued PT services  PT Problem List Decreased strength;Decreased range of motion;Decreased activity tolerance;Decreased balance;Decreased mobility;Decreased knowledge of use of DME;Decreased safety awareness;Decreased knowledge of precautions;Pain       PT Treatment Interventions DME instruction;Gait training;Stair training;Functional mobility training;Therapeutic activities;Therapeutic exercise;Neuromuscular re-education;Patient/family education    PT Goals (Current goals can be found in the Care Plan section)  Acute Rehab PT Goals Patient Stated Goal: To get better PT Goal Formulation: With patient/family Time For Goal Achievement: 02/26/17 Potential to Achieve Goals: Good    Frequency Min 5X/week   Barriers to discharge        Co-evaluation               AM-PAC PT "6 Clicks" Daily Activity  Outcome Measure Difficulty turning over in bed (including adjusting bedclothes, sheets and blankets)?: None Difficulty moving from lying on back to sitting on the side of the bed? : A Little Difficulty sitting down on and standing up from a chair with arms (e.g., wheelchair, bedside commode, etc,.)?: A Little Help needed moving to and from a bed to chair (including a wheelchair)?: A Little Help needed walking in hospital room?: A Little Help needed climbing  3-5 steps with a railing? : A Lot 6 Click Score: 18    End of Session Equipment Utilized During Treatment: Cervical collar;Gait belt Activity Tolerance: Patient tolerated treatment well Patient left: in chair;with call bell/phone within reach Nurse Communication: Mobility status PT Visit Diagnosis: Unsteadiness on feet (R26.81);Pain;Difficulty in walking, not elsewhere classified (R26.2) Pain - part of body:  (neck)    Time: 4401-0272 PT Time Calculation (min) (ACUTE ONLY): 19 min   Charges:   PT Evaluation $PT Eval Moderate Complexity: 1 Mod     PT G Codes:        Samantha Quinn, PT, DPT Acute Rehabilitation Services Pager: [phone]   Thelma Comp 02/19/2017, 10:22 AM

## 2017-02-19 NOTE — Progress Notes (Signed)
OT Note Addendum for Charges    02/19/17 0956  OT Time Calculation  OT Start Time (ACUTE ONLY) 0800  OT Stop Time (ACUTE ONLY) 0841  OT Time Calculation (min) 41 min  OT General Charges  $OT Visit 1 Visit  OT Evaluation  $OT Eval Moderate Complexity 1 Mod  OT Treatments  $Self Care/Home Management  23-37 mins   Maryclare Nydam A. Ulice Brilliant, M.S., OTR/L Pager: [phone]

## 2017-02-19 NOTE — Progress Notes (Signed)
Patient alert and oriented, mae's well, voiding adequate amount of urine, swallowing without difficulty, no c/o pain at time of discharge. Patient discharged home with family. Script and discharged instructions given to patient. Patient and family stated understanding of instructions given. Patient has an appointment with Dr. Dumonski 

## 2017-02-22 ENCOUNTER — Encounter (HOSPITAL_COMMUNITY): Payer: Self-pay | Admitting: Orthopedic Surgery

## 2017-02-23 ENCOUNTER — Ambulatory Visit: Payer: Medicare Other | Admitting: Physical Therapy

## 2017-02-25 ENCOUNTER — Ambulatory Visit: Payer: Medicare Other | Admitting: Physical Therapy

## 2017-03-01 ENCOUNTER — Ambulatory Visit: Payer: Medicare Other | Admitting: Internal Medicine

## 2017-03-03 ENCOUNTER — Encounter: Payer: Self-pay | Admitting: Hematology & Oncology

## 2017-03-03 ENCOUNTER — Other Ambulatory Visit: Payer: Self-pay

## 2017-03-03 ENCOUNTER — Ambulatory Visit (HOSPITAL_BASED_OUTPATIENT_CLINIC_OR_DEPARTMENT_OTHER): Payer: Medicare Other | Admitting: Hematology & Oncology

## 2017-03-03 VITALS — BP 144/72 | HR 93 | Temp 98.2°F | Resp 20 | Wt 123.0 lb

## 2017-03-03 DIAGNOSIS — C7951 Secondary malignant neoplasm of bone: Secondary | ICD-10-CM

## 2017-03-03 DIAGNOSIS — C3411 Malignant neoplasm of upper lobe, right bronchus or lung: Secondary | ICD-10-CM

## 2017-03-03 DIAGNOSIS — G893 Neoplasm related pain (acute) (chronic): Secondary | ICD-10-CM

## 2017-03-03 DIAGNOSIS — Z9889 Other specified postprocedural states: Secondary | ICD-10-CM | POA: Diagnosis not present

## 2017-03-03 DIAGNOSIS — C3490 Malignant neoplasm of unspecified part of unspecified bronchus or lung: Secondary | ICD-10-CM

## 2017-03-03 NOTE — Progress Notes (Signed)
Dictation #1 [id number]  [id number]:[id number]  Hematology and Oncology Follow Up Visit  JUELLE DICKMANN [id number] Jul 13, 1927 83 y.o. 03/03/2017   Principle Diagnosis:   Stage IV adenocarcinoma of the right lung-mutation negative  C6-7 left nerve root compression secondary to metastatic disease  Current Therapy:    Tecentriq 1200 mg IV every 3 weeks s/p cycle #8  Status post radiation/chemotherapy for recurrence-completed October 2017  Status post right upper lobectomy in March 2013 for stage IB disease     Interim History:  Ms. Ola is back for a visit.  She did have neck surgery 2 weeks ago.  She had decompression and fusion of her cervical spine.  This was done by Dr. Lynann Bologna.  This was done on October 25.  The pathology report) [id number]) showed metastatic adenocarcinoma of the lung.  I called pathology today.  I told him to send the material off for [facility] testing.  She has a cervical collar on.  She has to wear this for another 4 weeks.  The pain in her left shoulder and arm is much much better.  There is still some weakness.    She has had no change in bowel or bladder habits.  There is been no leg weakness.  She has had a little bit of a cough that is nonproductive.  She has had no fever.  She has had no headache.  Overall, I would say performance status is ECOG 2.   Medications:  Current Outpatient Medications:  .  amitriptyline (ELAVIL) 10 MG tablet, TAKE 1 TABLET BY MOUTH EVERYDAY AT BEDTIME, Disp: 90 tablet, Rfl: 0 .  bisoprolol-hydrochlorothiazide (ZIAC) 2.5-6.25 MG tablet, TAKE 1 TABLET DAILY, Disp: 90 tablet, Rfl: 3 .  Blood Glucose Monitoring Suppl (ONE TOUCH ULTRA 2) w/Device KIT, Use to check blood sugars twice a day Dx E11.9, Disp: 1 each, Rfl: 0 .  calcium carbonate (OS-CAL) 600 MG tablet, Take 600 mg by mouth daily., Disp: , Rfl:  .  gabapentin (NEURONTIN) 100 MG capsule, Take 100 mg by mouth 3 (three) times daily. , Disp: , Rfl:  .  glucose blood  (ONE TOUCH ULTRA TEST) test strip, 1 each by Other route 2 (two) times daily. Use to check blood sugars twice a day, Disp: 100 each, Rfl: 3 .  Lancets (ONETOUCH ULTRASOFT) lancets, 1 each by Other route 2 (two) times daily. Use to help check blood sugars twice a day, Disp: 100 each, Rfl: 3 .  lovastatin (MEVACOR) 20 MG tablet, TAKE 1 TABLET BY MOUTH EVERY OTHER DAY, Disp: 90 tablet, Rfl: 1 .  metFORMIN (GLUCOPHAGE) 500 MG tablet, TAKE 1 TABLET 2 TIMES DAILY (Patient taking differently: TAKE 2 TABLET 2 TIMES DAILY), Disp: 180 tablet, Rfl: 1 .  methimazole (TAPAZOLE) 5 MG tablet, Take 0.5 tablets (2.5 mg total) by mouth daily. (Patient taking differently: Take 5 mg by mouth every other day. ), Disp: 30 tablet, Rfl: 3 .  Multiple Vitamins-Minerals (VISION-VITE PRESERVE PO), Take 1 tablet by mouth daily. , Disp: , Rfl:  .  pantoprazole (PROTONIX) 40 MG tablet, Take 1 tablet (40 mg total) by mouth daily., Disp: 30 tablet, Rfl: 4 .  ramipril (ALTACE) 5 MG capsule, Take 5 mg by mouth every evening., Disp: , Rfl:  .  tiZANidine (ZANAFLEX) 2 MG tablet, , Disp: , Rfl:  .  levofloxacin (LEVAQUIN) 750 MG tablet, Take 750 mg by mouth daily., Disp: , Rfl:   Allergies:  Allergies  Allergen Reactions  . Tramadol Other (See Comments)  syncope  . Codeine Nausea And Vomiting  . Demerol Nausea Only    Past Medical History, Surgical history, Social history, and Family History were reviewed and updated.  Review of Systems: As stated in the interim history  Physical Exam:  weight is 123 lb 0.6 oz (55.8 kg). Her oral temperature is 98.2 F (36.8 C). Her blood pressure is 144/72 (abnormal) and her pulse is 93. Her respiration is 20 and oxygen saturation is 100%.   Wt Readings from Last 3 Encounters:  03/03/17 123 lb 0.6 oz (55.8 kg)  02/17/17 121 lb (54.9 kg)  02/12/17 126 lb 9.6 oz (57.4 kg)     Physical Exam  Constitutional: She is oriented to person, place, and time.  HENT:  Head: Normocephalic  and atraumatic.  Mouth/Throat: Oropharynx is clear and moist.  Eyes: EOM are normal. Pupils are equal, round, and reactive to light.  Neck: Normal range of motion.  Cardiovascular: Normal rate, regular rhythm and normal heart sounds.  Pulmonary/Chest: Effort normal and breath sounds normal.  Abdominal: Soft. Bowel sounds are normal.  Musculoskeletal: Normal range of motion. She exhibits no edema, tenderness or deformity.  She does have tenderness in the posterior left shoulder. She has moderate decreased range of motion of the left shoulder. She has partial abduction of the left arm.  Lymphadenopathy:    She has no cervical adenopathy.  Neurological: She is alert and oriented to person, place, and time.  Skin: Skin is warm and dry. No rash noted. No erythema.  Psychiatric: She has a normal mood and affect. Her behavior is normal. Judgment and thought content normal.  Vitals reviewed.    Lab Results  Component Value Date   WBC 14.4 (H) 02/17/2017   HGB 12.6 02/17/2017   HCT 38.4 02/17/2017   MCV 85.1 02/17/2017   PLT 197 02/17/2017     Chemistry      Component Value Date/Time   NA 135 02/12/2017 1041   NA 138 01/20/2017 1035   NA 139 06/08/2016 1316   K 3.6 02/12/2017 1041   K 3.7 01/20/2017 1035   K 3.7 06/08/2016 1316   CL 100 (L) 02/12/2017 1041   CL 98 01/20/2017 1035   CO2 23 02/12/2017 1041   CO2 29 01/20/2017 1035   CO2 24 06/08/2016 1316   BUN 26 (H) 02/12/2017 1041   BUN 15 01/20/2017 1035   BUN 13.3 06/08/2016 1316   CREATININE 0.74 02/12/2017 1041   CREATININE 0.9 01/20/2017 1035   CREATININE 0.7 06/08/2016 1316      Component Value Date/Time   CALCIUM 8.6 (L) 02/12/2017 1041   CALCIUM 9.4 01/20/2017 1035   CALCIUM 9.8 06/08/2016 1316   ALKPHOS 69 02/12/2017 1041   ALKPHOS 88 (H) 01/20/2017 1035   ALKPHOS 79 06/08/2016 1316   AST 18 02/12/2017 1041   AST 23 01/20/2017 1035   AST 14 06/08/2016 1316   ALT 22 02/12/2017 1041   ALT 20 01/20/2017 1035    ALT 11 06/08/2016 1316   BILITOT 0.7 02/12/2017 1041   BILITOT 0.80 01/20/2017 1035   BILITOT 0.25 06/08/2016 1316         Impression and Plan: Ms. Talton is a 83 year-old white female. She has progressive non-small cell lung cancer. This is adenocarcinoma.  At this point, we need to think about radiation therapy to the cervical spine to help with recurrent disease minimization.  I will speak with Dr. Sondra Come about this.  I told Ms. Baze and her  family that I thought that radiation would probably be 2 or 3 weeks long and that she probably would have between 10-12 treatments.  I would not think that there would be a lot of side effects to radiation.  However, I told them that Dr. Sondra Come will tell them about this.  As far as systemic therapy is concerned, I would only treat her if we could use targeted therapy.  She is not a candidate for systemic chemotherapy from my point of view.  She did very well with Tecentriq.  I thought we got "a lot of mileage" out of Tecentriq.  I spent about 45 minutes with she and her family.  I explained my recommendations.  I went over the pathology report with them.  I would like to see her back in another for 5 weeks.  We probably need to consider a PET scan on her to see if she has any other sites of metastatic disease.  I would think that she does not.  [name], MD 11/7/20185:09 PM

## 2017-03-03 NOTE — Discharge Summary (Signed)
Patient ID: Samantha Quinn MRN: [id number] DOB/AGE: [birth year] 83 y.o.  Admit date: 02/17/2017 Discharge date: 02/19/2017  Admission Diagnoses:  Active Problems:   Radiculopathy   Discharge Diagnoses:  Same  Past Medical History:  Diagnosis Date  . Anxiety   . Back pain    bulding disc  . Bronchitis    hx of-many,many years   . Diabetes mellitus   . Diabetes mellitus    type 2 and takes Metformin bid  . Dyslipidemia   . Embolism - blood clot    hx of in left lower leg and was on Coumadin for about 20month;this was about 8-179yrago  . Family history of adverse reaction to anesthesia    Daughter- Nausea  . History of colonic polyps   . HOH (hard of hearing)   . Hx of migraines    as a teenager  . Hypercholesterolemia   . Hyperlipidemia    takes Lovastatin every other day  . Hypertension    takes Ziac daily and Ramipril as well  . Hyperthyroidism   . Hypothyroidism    takes Synthroid every other day  . Insomnia    takes Elavil nightly  . Joint pain   . Lung mass   . Myalgia   . Osteoarthritis   . Osteoporosis   . Pneumothorax of right lung after biopsy 07/09/11   HAD CHEST TUBE PLACEMENT  . PONV (postoperative nausea and vomiting)   . [place] adenocarcinoma of lower lobe of right [place] [place]   pT2a, pN0 M0 Stage 1 Well differenced Adenocarcinoma 3.5 cm resected 07/17/2011  . Primary adenocarcinoma of lung ([place]  . Shortness of breath dyspnea    With exertion  . Skin cancer    legs have dark spots on them  . Upper respiratory infection 04/2011  . Urinary incontinence    takes Detrol daily prn    Surgeries: Procedure(s): CERVICAL Five-Six, CERVICAL Six-Seven, CERVICAL Seven-THORACIC One, THORACIC One-Two POSTERIOR SPINAL FUSION WITH INSTRUMENTATION AND ALLOGRAFT on 02/18/2017   Consultants: None  Discharged Condition: Improved  Hospital Course: Samantha PAPAs an [age].o. female who was admitted 02/17/2017 for operative treatment  of radiculopathy/tumor. Patient has severe unremitting pain that affects sleep, daily activities, and work/hobbies. After pre-op clearance the patient was taken to the operating room on 02/18/2017 and underwent  Procedure(s): CERVICAL Five-Six, CERVICAL Six-Seven, CERVICAL Seven-THORACIC One, THORACIC One-Two POSTERIOR SPINAL FUSION WITH INSTRUMENTATION AND ALLOGRAFT.    Patient was given perioperative antibiotics:  Anti-infectives (From admission, onward)   Start     Dose/Rate Route Frequency Ordered Stop   02/18/17 1800  levofloxacin (LEVAQUIN) tablet 750 mg  Status:  Discontinued     750 mg Oral Every 48 hours 02/17/17 1828 02/19/17 2047   02/18/17 1600  ceFAZolin (ANCEF) IVPB 2g/100 mL premix     2 g 200 mL/hr over 30 Minutes Intravenous Every 8 hours 02/18/17 1424 02/18/17 2333   02/17/17 2200  ceFAZolin (ANCEF) IVPB 2g/100 mL premix     2 g 200 mL/hr over 30 Minutes Intravenous Every 8 hours 02/17/17 1828 02/18/17 0810   02/17/17 0900  ceFAZolin (ANCEF) IVPB 2g/100 mL premix  Status:  Discontinued     2 g 200 mL/hr over 30 Minutes Intravenous To Surgery 02/17/17 0854 02/17/17 1827       Patient was given sequential compression devices, early ambulation to prevent DVT.  Patient benefited maximally from hospital stay and there were no complications.    Recent  vital signs: BP 140/66   Pulse 88   Temp 98.9 F (37.2 C)   Resp 16   Ht 5' 1" (1.549 m)   Wt 54.9 kg (121 lb)   SpO2 98%   BMI 22.86 kg/m   Discharge Medications:   Allergies as of 02/19/2017      Reactions   Tramadol Other (See Comments)   syncope   Codeine Nausea And Vomiting   Demerol Nausea Only      Medication List    STOP taking these medications   dexamethasone 4 MG tablet Commonly known as:  DECADRON   HYDROcodone-homatropine 5-1.5 MG/5ML syrup Commonly known as:  HYCODAN     TAKE these medications   amitriptyline 10 MG tablet Commonly known as:  ELAVIL TAKE 1 TABLET BY MOUTH EVERYDAY AT  BEDTIME   bisoprolol-hydrochlorothiazide 2.5-6.25 MG tablet Commonly known as:  ZIAC TAKE 1 TABLET DAILY   calcium carbonate 600 MG tablet Commonly known as:  OS-CAL Take 600 mg by mouth daily.   gabapentin 100 MG capsule Commonly known as:  NEURONTIN Take 100 mg by mouth 3 (three) times daily.   glucose blood test strip Commonly known as:  ONE TOUCH ULTRA TEST 1 each by Other route 2 (two) times daily. Use to check blood sugars twice a day   levofloxacin 750 MG tablet Commonly known as:  LEVAQUIN Take 750 mg by mouth daily.   lovastatin 20 MG tablet Commonly known as:  MEVACOR TAKE 1 TABLET BY MOUTH EVERY OTHER DAY   metFORMIN 500 MG tablet Commonly known as:  GLUCOPHAGE TAKE 1 TABLET 2 TIMES DAILY What changed:  See the new instructions.   methimazole 5 MG tablet Commonly known as:  TAPAZOLE Take 0.5 tablets (2.5 mg total) by mouth daily. What changed:    how much to take  when to take this   [place] 2 w/Device Kit Use to check blood sugars twice a day Dx E11.9   onetouch ultrasoft lancets 1 each by Other route 2 (two) times daily. Use to help check blood sugars twice a day   pantoprazole 40 MG tablet Commonly known as:  PROTONIX Take 1 tablet (40 mg total) by mouth daily.   ramipril 5 MG capsule Commonly known as:  ALTACE Take 5 mg by mouth every evening.   VISION-VITE PRESERVE PO Take 1 tablet by mouth daily.       Diagnostic Studies: Dg Chest 2 View  Result Date: 02/12/2017 CLINICAL DATA:  Preoperative evaluation for upcoming cervical surgery, history of non-small-cell lung carcinoma EXAM: CHEST  2 VIEW COMPARISON:  01/20/2017, 05/05/2016 FINDINGS: Cardiac shadow is within normal limits. The lungs are well aerated bilaterally. Postsurgical changes are noted on the right with volume loss similar to that seen on the prior exam. Post radiation changes are noted as well. IMPRESSION: Chronic changes similar to that seen on recent CT examination.  Electronically Signed   By: Samantha Quinn M.D.   On: 02/12/2017 12:06   Dg Cervical Spine 1 View  Result Date: 02/18/2017 CLINICAL DATA:  C5-T2 posterior cervical fusion. EXAM: CERVICAL SPINE 1 VIEW COMPARISON:  Cervical spine x-rays from yesterday. FINDINGS: Surgical retractors noted in the posterior soft tissues overlying the lower cervical spine, extending from C5-C6 through T1-T2. Prior C6-C7 ACDF. Alignment is normal. IMPRESSION: 1. Surgical retractors in the posterior soft tissues overlying the lower cervical spine. 2. Prior C6-C7 ACDF without evidence of hardware complication. Electronically Signed   By: Orville Govern.D.  On: 02/18/2017 09:38   Dg Cervical Spine 2-3 Views  Result Date: 02/18/2017 CLINICAL DATA:  Posterior spinal fusion. EXAM: CERVICAL SPINE - 2-3 VIEW; DG C-ARM 61-120 MIN COMPARISON:  Earlier intraoperative images from today in yesterday. Cervical spine CT 02/12/2017. FLUOROSCOPY TIME:  C-arm fluoroscopic images were obtained intraoperatively and submitted for post operative interpretation. Please see the performing provider's procedural report for the fluoroscopy time utilized. FINDINGS: Four intraoperative spot fluoroscopic images of the cervical spine are provided. C6-7 ACDF is again noted. There has been interval posterior spinal fusion from C5-T2. Bilateral articular pillar/pedicle screws are present at C5, T1, and T2, and there is a single right-sided screw at C6. IMPRESSION: Intraoperative images during C5-T2 posterior fusion. Electronically Signed   By: Logan Bores M.D.   On: 02/18/2017 11:53   Dg Cervical Spine 2-3 Views  Result Date: 02/17/2017 CLINICAL DATA:  C6-7 ACDF EXAM: DG C-ARM 61-120 MIN; CERVICAL SPINE - 2-3 VIEW COMPARISON:  02/12/2017 CT FINDINGS: First lateral intraoperative image demonstrates anterior localizing instrument directed at the C6-7 level. Final image demonstrates changes of anterior fusion at C6-7. No hardware bony complicating feature  noted. IMPRESSION: C6-7 ACDF.  No visible complicating feature. Electronically Signed   By: Rolm Baptise M.D.   On: 02/17/2017 15:15   Ct Cervical Spine Wo Contrast  Result Date: 02/12/2017 CLINICAL DATA:  History of metastatic lung cancer, preoperative evaluation for C7 tumor. EXAM: CT CERVICAL SPINE WITHOUT CONTRAST TECHNIQUE: Multidetector CT imaging of the cervical spine was performed without intravenous contrast. Multiplanar CT image reconstructions were also generated. COMPARISON:  MRI of the cervical spine February 04, 2017 FINDINGS: ALIGNMENT: Maintained lordosis. Vertebral bodies in alignment. SKULL BASE AND VERTEBRAE: Expansile permeative C7 lesion within LEFT pedicle to posterior elements with pathologic fracture through LEFT lamina scanned spinous process again noted. Known C6 spinous process metastasis not apparent by CT. Vertebral bodies intact. Osteopenia. Moderate C6-7 disc height loss with vacuum disc and endplate spurring compatible with degenerative disc, mild at C5-6. C1-2 articulation maintained with mild arthropathy. SOFT TISSUES AND SPINAL CANAL: 3.3 cm RIGHT thyroid nodule, smaller LEFT thyroid nodules, these were better characterized on recent CT chest. DISC LEVELS: No significant osseous canal stenosis or neural foraminal narrowing. C2-3, C3-4, C4-5: No disc bulge, canal stenosis nor neural foraminal narrowing. Mild C3-4 and moderate LEFT greater than RIGHT C4-5 facet arthropathy. C5-6: Uncovertebral hypertrophy, mild RIGHT greater than LEFT facet arthropathy without canal stenosis. Mild neural foraminal narrowing. C6-7: Infiltrative tumor LEFT middle and posterior column without canal stenosis though, soft tissue effaces the LEFT lateral recess and neural foramen. Mild RIGHT neural foraminal narrowing. C7-T1: No disc bulge, canal stenosis nor neural foraminal narrowing. UPPER CHEST: RIGHT upper lobe consolidation and pleural thickening better characterized on CT chest January 20, 2017 OTHER: None. IMPRESSION: 1. C7 osseous metastasis with epidural extension to LEFT neural foramen, pathologic fracture through LEFT middle and posterior column without vertebral body height loss. 2. Known additional metastasis not apparent by CT. Electronically Signed   By: Elon Alas M.D.   On: 02/12/2017 14:16   Mr Cervical Spine Wo Contrast  Result Date: 02/04/2017 CLINICAL DATA:  Cervical radiculopathy. Numbness and tingling in the left shoulder and arm extending to the fingers. Left neck pain. Symptoms for 2-3 months. History of lung cancer. EXAM: MRI CERVICAL SPINE WITHOUT CONTRAST TECHNIQUE: Multiplanar, multisequence MR imaging of the cervical spine was performed. No intravenous contrast was administered. COMPARISON:  PET-CT 12/06/2015.  Chest CT 01/20/2017. FINDINGS: Alignment: Slight reversal  of the lordosis in the lower cervical spine. Trace anterolisthesis of C4 on C5. Vertebrae: There is a destructive soft tissue mass involving the left posterior C7 vertebral body and left greater than right C7 posterior elements. The configuration on MRI and minimal imaging of this level on the prior chest CT suggest pathologic fracture through the lamina/ base of the spinous process. Tumor extends into the inferior aspect of the left C6-7 neural foramen. There is likely a separate focus of tumor in the C6 spinous process. A round 6 mm lesion is present in the T3 vertebral body. A smaller rounded focus of low signal more posteriorly in the T3 vertebral body measures 2-3 mm and may represent a metastasis or be vascular. Cord: Normal signal. Posterior Fossa, vertebral arteries, paraspinal tissues: Right larger than left thyroid nodules as previously seen. Right apical lung changes more fully evaluated on recent CT. Disc levels: C2-3:  Negative. C3-4: Small central disc protrusion and mild facet arthrosis without stenosis. C4-5: Small central disc protrusion and mild facet arthrosis without stenosis. C5-6:  Uncovertebral spurring, minimal disc bulging, and mild facet arthrosis without significant stenosis. C6-7: Mild disc space narrowing. Disc bulging, uncovertebral spurring, and epidural tumor in the dorsal and left lateral aspects of the spinal canal result in mild spinal stenosis. Tumor extends into and moderately narrows the left neural foramen and may directly involve the exiting left C7 nerve. C7-T1: Tumor slightly extends into the superior aspect of the left neural foramen without significant foraminal stenosis. No disc herniation or spinal stenosis. IMPRESSION: 1. Destructive mass involving the C7 vertebral body and left greater than right posterior elements consistent with metastasis. Pathologic fracture through the lamina/base of spinous process. Epidural tumor contributes to mild spinal stenosis and moderate left neural foraminal stenosis likely affecting the left C7 nerve. 2. Additional small metastases in the C6 spinous process and T3 vertebral body. These results will be called to the ordering clinician or representative by the Radiologist Assistant, and communication documented in the PACS or zVision Dashboard. Electronically Signed   By: Allen  Grady M.D.   On: 02/04/2017 09:34   Dg C-arm 1-60 Min  Result Date: 02/17/2017 CLINICAL DATA:  C6-7 ACDF EXAM: DG C-ARM 61-120 MIN; CERVICAL SPINE - 2-3 VIEW COMPARISON:  02/12/2017 CT FINDINGS: First lateral intraoperative image demonstrates anterior localizing instrument directed at the C6-7 level. Final image demonstrates changes of anterior fusion at C6-7. No hardware bony complicating feature noted. IMPRESSION: C6-7 ACDF.  No visible complicating feature. Electronically Signed   By: Kevin  Dover M.D.   On: 02/17/2017 15:15   Dg C-arm 61-120 Min  Result Date: 02/18/2017 CLINICAL DATA:  Posterior spinal fusion. EXAM: CERVICAL SPINE - 2-3 VIEW; DG C-ARM 61-120 MIN COMPARISON:  Earlier intraoperative images from today in yesterday. Cervical spine CT  02/12/2017. FLUOROSCOPY TIME:  C-arm fluoroscopic images were obtained intraoperatively and submitted for post operative interpretation. Please see the performing provider's procedural report for the fluoroscopy time utilized. FINDINGS: Four intraoperative spot fluoroscopic images of the cervical spine are provided. C6-7 ACDF is again noted. There has been interval posterior spinal fusion from C5-T2. Bilateral articular pillar/pedicle screws are present at C5, T1, and T2, and there is a single right-sided screw at C6. IMPRESSION: Intraoperative images during C5-T2 posterior fusion. Electronically Signed   By: Allen  Grady M.D.   On: 02/18/2017 11:53    Disposition: 01-Home or Self Care   POD #1 s/p A/P cervical decompression and fusion C5-T2  - encourage ambulation - Percocet   for pain, Valium for muscle spasms -Written scripts for pain signed and in chart -D/C instructions sheet printed and in chart -D/C today  -F/U in office 2 weeks   Signed: Justice Britain 03/03/2017, 12:12 PM

## 2017-03-05 ENCOUNTER — Encounter: Payer: Self-pay | Admitting: Radiation Oncology

## 2017-03-05 NOTE — Progress Notes (Signed)
Histology and Location of Primary Cancer: Stage IV adenocarcinoma of the right lung  Location(s) of Symptomatic Metastases: C6-7 left nerve root compression secondary to metastatic disease  Biopsy Revealed:  02/18/17 Diagnosis Soft tissue, biopsy, Left cervical seven lateral METASTATIC ADENOCARCINOMA OF THE LUNG  Past/Anticipated chemotherapy by medical oncology, if any: Tecentriq 1200 mg IV every 3 weeks s/p cycle #8 - which has been stopped.  Pain on a scale of 0-10 is: 3/10 across her shoulders.  She is taking tylenol and zanaflex q 6 hours.   If Spine Met(s), symptoms, if any, include:  Bowel/Bladder retention or incontinence (please describe): no  Numbness or weakness in extremities (please describe): she reports she is weak but denies having any numbness.  Current Decadron regimen, if applicable: no  Ambulatory status? Walker? Wheelchair?: Ambulatory  SAFETY ISSUES:  Prior radiation? Yes - 01/06/16-02/19/16 by Dr. Tammi Klippel to her right chest.  Pacemaker/ICD? no  Possible current pregnancy? no  Is the patient on methotrexate? no  Current Complaints / other details:  Patient had decompression and fusion of her cervical spine on 02/17/17 and 02/18/17.  She is here with her daughter.  BP 102/71 (BP Location: Right Arm, Patient Position: Sitting)   Pulse 98   Temp 98.1 F (36.7 C) (Oral)   Ht '5\' 1"'  (1.549 m)   Wt 120 lb 12.8 oz (54.8 kg)   SpO2 97%   BMI 22.82 kg/m    Wt Readings from Last 3 Encounters:  03/11/17 120 lb 12.8 oz (54.8 kg)  03/03/17 123 lb 0.6 oz (55.8 kg)  02/17/17 121 lb (54.9 kg)

## 2017-03-09 ENCOUNTER — Other Ambulatory Visit: Payer: Self-pay | Admitting: Internal Medicine

## 2017-03-09 ENCOUNTER — Telehealth: Payer: Self-pay | Admitting: *Deleted

## 2017-03-09 DIAGNOSIS — E119 Type 2 diabetes mellitus without complications: Secondary | ICD-10-CM

## 2017-03-09 NOTE — Telephone Encounter (Signed)
Her sugars were well controlled prior to surgery - likely will decrease over time.  Lets start a second medication for now but she may not need it for long.  Will try onglyza - pending.  One pill daily.  We can increase this if needed.  Have her call us with her sugars and keep [place] updated

## 2017-03-09 NOTE — Telephone Encounter (Signed)
Pt called in and said that she is having trouble with her sugar meds.  She had surgery and it has made her blood sugar high.  It was 188 this morning.  She would like a nurse to call her.  Pt is not feeling well, hot sweaty and weak

## 2017-03-09 NOTE — Telephone Encounter (Signed)
Patient is c/o continued high blood sugar levels. She states they've been in the 180's for a few days and she wants to know if any of her meds can be the cause. Reviewed her med list and nothing obvious is seen. Patient did recently come off of high dose decadron.  Instructed patient to make appointment with the MD who manages her diabetes. She states this is Dr Quay Burow. She states she hasn't seen her for over 6 months. Reiterated that the patient is having difficulty with blood sugar management and that needs to be addressed by the physician who manages her diabetes. She understands and will contact Dr Quay Burow office for an appointment.

## 2017-03-10 ENCOUNTER — Telehealth: Payer: Self-pay | Admitting: Internal Medicine

## 2017-03-10 MED ORDER — METFORMIN HCL 500 MG PO TABS: ORAL_TABLET | ORAL | 1 refills | Status: DC

## 2017-03-10 MED ORDER — SAXAGLIPTIN HCL 2.5 MG PO TABS: 2.5000 mg | ORAL_TABLET | Freq: Every day | ORAL | 0 refills | Status: DC

## 2017-03-10 NOTE — Telephone Encounter (Signed)
Patient Name: Samantha Quinn DOB: [birth year] Initial Comment Caller states she is having problems with her diabetes medicine. Her blood sugar is running high, 178. She has been hot, sweaty, and weak. She is doubled on her Metformin. She is having diarrhea now. Nurse Assessment Nurse: Sherrell Puller, RN, Amy Date/Time Eilene Ghazi Time): 03/10/2017 9:53:23 AM Confirm and document reason for call. If symptomatic, describe symptoms. ---Caller states she has already talked with doctor Burns nurse and she is helping her now. This nurse asked if she needed anything else or had any more questions and the patient declined at this time. Does the patient have any new or worsening symptoms? ---Yes Will a triage be completed? ---No Select reason for no triage. ---Patient declined

## 2017-03-10 NOTE — Telephone Encounter (Signed)
Pt daughter called and her insurance will not cover saxagliptin HCl (ONGLYZA) 2.5 MG TABS tablet  But they will cover JANUVIA, please advise if this is adequate substitute and call back

## 2017-03-11 ENCOUNTER — Ambulatory Visit
Admission: RE | Admit: 2017-03-11 | Discharge: 2017-03-11 | Disposition: A | Discharge status: Home / Self Care | Payer: Medicare Other | Source: Ambulatory Visit | Attending: Radiation Oncology | Admitting: Radiation Oncology

## 2017-03-11 ENCOUNTER — Encounter: Payer: Self-pay | Admitting: Radiation Oncology

## 2017-03-11 ENCOUNTER — Other Ambulatory Visit: Payer: Self-pay

## 2017-03-11 ENCOUNTER — Ambulatory Visit (HOSPITAL_BASED_OUTPATIENT_CLINIC_OR_DEPARTMENT_OTHER): Payer: Medicare Other

## 2017-03-11 ENCOUNTER — Telehealth: Payer: Self-pay | Admitting: *Deleted

## 2017-03-11 ENCOUNTER — Other Ambulatory Visit: Payer: Self-pay | Admitting: Family

## 2017-03-11 ENCOUNTER — Other Ambulatory Visit: Payer: Self-pay | Admitting: *Deleted

## 2017-03-11 VITALS — BP 102/71 | HR 98 | Temp 98.1°F | Ht 61.0 in | Wt 120.8 lb

## 2017-03-11 DIAGNOSIS — Z510 Encounter for antineoplastic radiation therapy: Secondary | ICD-10-CM | POA: Diagnosis not present

## 2017-03-11 DIAGNOSIS — C3490 Malignant neoplasm of unspecified part of unspecified bronchus or lung: Secondary | ICD-10-CM | POA: Diagnosis not present

## 2017-03-11 DIAGNOSIS — D500 Iron deficiency anemia secondary to blood loss (chronic): Secondary | ICD-10-CM | POA: Diagnosis present

## 2017-03-11 DIAGNOSIS — C7951 Secondary malignant neoplasm of bone: Secondary | ICD-10-CM | POA: Insufficient documentation

## 2017-03-11 DIAGNOSIS — Z79899 Other long term (current) drug therapy: Secondary | ICD-10-CM | POA: Diagnosis not present

## 2017-03-11 DIAGNOSIS — C3431 Malignant neoplasm of lower lobe, right bronchus or lung: Secondary | ICD-10-CM

## 2017-03-11 DIAGNOSIS — Z981 Arthrodesis status: Secondary | ICD-10-CM | POA: Diagnosis not present

## 2017-03-11 DIAGNOSIS — Z923 Personal history of irradiation: Secondary | ICD-10-CM | POA: Diagnosis not present

## 2017-03-11 HISTORY — DX: Personal history of irradiation: Z92.3

## 2017-03-11 LAB — CBC WITH DIFFERENTIAL/PLATELET
BASO%: 0.1 % (ref 0.0–2.0)
BASOS ABS: 0 10*3/uL (ref 0.0–0.1)
EOS ABS: 0.1 10*3/uL (ref 0.0–0.5)
EOS%: 0.8 % (ref 0.0–7.0)
HEMATOCRIT: 30.5 % — AB (ref 34.8–46.6)
HGB: 9.8 g/dL — ABNORMAL LOW (ref 11.6–15.9)
LYMPH#: 0.3 10*3/uL — AB (ref 0.9–3.3)
LYMPH%: 4.3 % — AB (ref 14.0–49.7)
MCH: 27.5 pg (ref 25.1–34.0)
MCHC: 32.1 g/dL (ref 31.5–36.0)
MCV: 85.7 fL (ref 79.5–101.0)
MONO#: 0.6 10*3/uL (ref 0.1–0.9)
MONO%: 8.4 % (ref 0.0–14.0)
NEUT#: 6.6 10*3/uL — ABNORMAL HIGH (ref 1.5–6.5)
NEUT%: 86.4 % — AB (ref 38.4–76.8)
PLATELETS: 521 10*3/uL — AB (ref 145–400)
RBC: 3.56 10*6/uL — ABNORMAL LOW (ref 3.70–5.45)
RDW: 15.9 % — ABNORMAL HIGH (ref 11.2–14.5)
WBC: 7.6 10*3/uL (ref 3.9–10.3)

## 2017-03-11 LAB — COMPREHENSIVE METABOLIC PANEL
ALK PHOS: 79 U/L (ref 40–150)
ALT: 11 U/L (ref 0–55)
ANION GAP: 12 meq/L — AB (ref 3–11)
AST: 11 U/L (ref 5–34)
Albumin: 2.7 g/dL — ABNORMAL LOW (ref 3.5–5.0)
BILIRUBIN TOTAL: 0.26 mg/dL (ref 0.20–1.20)
BUN: 16.1 mg/dL (ref 7.0–26.0)
CALCIUM: 9.6 mg/dL (ref 8.4–10.4)
CHLORIDE: 100 meq/L (ref 98–109)
CO2: 23 mEq/L (ref 22–29)
CREATININE: 0.9 mg/dL (ref 0.6–1.1)
Glucose: 166 mg/dl — ABNORMAL HIGH (ref 70–140)
Potassium: 3.9 mEq/L (ref 3.5–5.1)
Sodium: 135 mEq/L — ABNORMAL LOW (ref 136–145)
Total Protein: 7 g/dL (ref 6.4–8.3)

## 2017-03-11 LAB — LACTATE DEHYDROGENASE: LDH: 230 U/L (ref 125–245)

## 2017-03-11 LAB — TECHNOLOGIST REVIEW

## 2017-03-11 MED ORDER — SITAGLIPTIN PHOSPHATE 100 MG PO TABS: 100.0000 mg | ORAL_TABLET | Freq: Every day | ORAL | 2 refills | Status: DC

## 2017-03-11 NOTE — Telephone Encounter (Signed)
LVM with patient daughter to notify her of change

## 2017-03-11 NOTE — Telephone Encounter (Signed)
Daughter is notifying the office of several symptoms seen in patient in the last few weeks.  - Patient is more fatigued and c/o being tired all the time, however is not sleeping well and often complains of feeling restless. No specific c/o pain.   - She has a decreased appetite. She denies nausea or vomiting. Eats very little before she says she's full. Has some supplements at home but reluctant to use them.   - daughter feels like her 'color is poor'  - patient's breathing gets labored with activity or talking.  Reviewed all symptoms with Dr Marin Olp. Patient has appointment currently scheduled on 03/17/2017. She has an appointment today with Dr Sondra Come at the [facility].   Dr Marin Olp would like labs drawn today to assess for any acute findings. Appointment made for today prior to Dr Clabe Seal appointment. Will follow up with daughter once we get results.

## 2017-03-11 NOTE — Progress Notes (Signed)
Radiation Oncology         [phone] ________________________________  Name: Samantha Quinn MRN: [id number]  Date: 03/11/2017  DOB: Jul 09, 1927  Re-Consult Visit Note   CC: Binnie Rail, MD  [name], MD    ICD-10-CM   1. Bone metastases (HCC) C79.51   2. Primary adenocarcinoma of lower lobe of right lung (HCC) C34.31   3. Lung cancer metastatic to bone [facility]) C34.90    C79.51     Diagnosis:  Recurrent non-small cell lung cancer  Interval Since Last Radiation:  1 year and 3 weeks  - 01/06/16-02/19/16 by Dr. Tammi Klippel for recurrent lung cancer, re-staged as recurrent IIA, T1b, N1, M0 adenocarcinoma of the right upper lung.    Narrative:  The patient returns today for routine follow-up. Pt presents with her daughter [name]. Pt lives alone about 10 minutes away from her daughter and is still able to drive and perform her errands. Pt has had previous radiation treatments due to recurrent lung cancer in 2017 with Dr. Tammi Klippel as her Radiation Oncologist. She states that she was seen by Medical Oncologist, Dr. Marin Olp for immunotherapy. Pt reports that Dr. Marin Olp stopped the immunotherapy treatment upon recent discover of her tumor. Pt states she did not have any side effects from inmmunotherapy. Prior to surgery, the pt reports that she had a lot of pain in her left shoulder which led to her getting an MRI. Pt presents with a neck brace that she wears 24/7. The pt states that she has 3 more weeks to wear the neck brace until it is replaced with a soft collar that she will wear for 2 weeks. Pt has numbness and tingling in her left fingers which she states improved after surgery. Pt denies any other symptoms.   MRI on 02/04/2017 revealed, IMPRESSION: Destructive mass involving the C7 vertebral body and left greater than right posterior elements consistent with metastasis. Pathologic fracture through the lamina/base of spinous process. Epidural tumor contributes to mild spinal stenosis and  moderate left neural formainal stenosis likely affecting the C7 nerve. Also, additional small metastases in the C6  Spinous process and T3.   On 02/17/2017, patient was taken to the operating room by Dr. Phylliss Bob.  She underwent anterior cervical spine fusion. On October 25 patient was taken back to the operating room and underwent posterior spine fusion this resulted in decompression of the prominent neuro foraminal narrowing noted on the left C 6-7, compressing the left the seventh nerve.   surgical pathology revealed, soft tissue, biopsy, left cervical seven lateral, METASTATIC ADENOCARCINOMA OF THE LUNG.     ALLERGIES:  is allergic to tramadol; codeine; and demerol.  Meds: Current Outpatient Medications  Medication Sig Dispense Refill  . amitriptyline (ELAVIL) 10 MG tablet TAKE 1 TABLET BY MOUTH EVERYDAY AT BEDTIME 90 tablet 0  . bisoprolol-hydrochlorothiazide (ZIAC) 2.5-6.25 MG tablet TAKE 1 TABLET DAILY 90 tablet 3  . Blood Glucose Monitoring Suppl (ONE TOUCH ULTRA 2) w/Device KIT Use to check blood sugars twice a day Dx E11.9 1 each 0  . calcium carbonate (OS-CAL) 600 MG tablet Take 600 mg by mouth daily.    [name] gabapentin (NEURONTIN) 100 MG capsule Take 100 mg by mouth 3 (three) times daily.     [name] glucose blood (ONE TOUCH ULTRA TEST) test strip 1 each by Other route 2 (two) times daily. Use to check blood sugars twice a day 100 each 3  . Lancets (ONETOUCH ULTRASOFT) lancets 1 each by Other  route 2 (two) times daily. Use to help check blood sugars twice a day 100 each 3  . lovastatin (MEVACOR) 20 MG tablet TAKE 1 TABLET BY MOUTH EVERY OTHER DAY 90 tablet 1  . metFORMIN (GLUCOPHAGE) 500 MG tablet TAKE 2 TABLET 2 TIMES DAILY 180 tablet 1  . methimazole (TAPAZOLE) 5 MG tablet Take 0.5 tablets (2.5 mg total) by mouth daily. (Patient taking differently: Take 5 mg by mouth every other day. ) 30 tablet 3  . ramipril (ALTACE) 5 MG capsule Take 5 mg by mouth every evening.    . sitaGLIPtin  (JANUVIA) 100 MG tablet Take 1 tablet (100 mg total) daily by mouth. 30 tablet 2  . tiZANidine (ZANAFLEX) 2 MG tablet     . Multiple Vitamins-Minerals (VISION-VITE PRESERVE PO) Take 1 tablet by mouth daily.      No current facility-administered medications for this encounter.     Physical Findings: The patient is in no acute distress. Patient is alert and oriented.  height is '5\' 1"'  (1.549 m) and weight is 120 lb 12.8 oz (54.8 kg). Her oral temperature is 98.1 F (36.7 C). Her blood pressure is 102/71 and her pulse is 98. Her oxygen saturation is 97%. .  Lungs are clear to auscultation bilaterally. Heart has regular rate and rhythm..  Pt has a [place] collar in place. Pt's motor strength in upper and lower extremities, at 5/5 in the proximal and distal muscle groups.       Lab Findings: Lab Results  Component Value Date   WBC 7.6 03/11/2017   HGB 9.8 (L) 03/11/2017   HCT 30.5 (L) 03/11/2017   MCV 85.7 03/11/2017   PLT 521 (H) 03/11/2017    Radiographic Findings: Dg Chest 2 View  Result Date: 02/12/2017 CLINICAL DATA:  Preoperative evaluation for upcoming cervical surgery, history of non-small-cell lung carcinoma EXAM: CHEST  2 VIEW COMPARISON:  01/20/2017, 05/05/2016 FINDINGS: Cardiac shadow is within normal limits. The lungs are well aerated bilaterally. Postsurgical changes are noted on the right with volume loss similar to that seen on the prior exam. Post radiation changes are noted as well. IMPRESSION: Chronic changes similar to that seen on recent CT examination. Electronically Signed   By: Inez Catalina M.D.   On: 02/12/2017 12:06   Dg Cervical Spine 1 View  Result Date: 02/18/2017 CLINICAL DATA:  C5-T2 posterior cervical fusion. EXAM: CERVICAL SPINE 1 VIEW COMPARISON:  Cervical spine x-rays from yesterday. FINDINGS: Surgical retractors noted in the posterior soft tissues overlying the lower cervical spine, extending from C5-C6 through T1-T2. Prior C6-C7 ACDF. Alignment is  normal. IMPRESSION: 1. Surgical retractors in the posterior soft tissues overlying the lower cervical spine. 2. Prior C6-C7 ACDF without evidence of hardware complication. Electronically Signed   By: Titus Dubin M.D.   On: 02/18/2017 09:38   Dg Cervical Spine 2-3 Views  Result Date: 02/18/2017 CLINICAL DATA:  Posterior spinal fusion. EXAM: CERVICAL SPINE - 2-3 VIEW; DG C-ARM 61-120 MIN COMPARISON:  Earlier intraoperative images from today in yesterday. Cervical spine CT 02/12/2017. FLUOROSCOPY TIME:  C-arm fluoroscopic images were obtained intraoperatively and submitted for post operative interpretation. Please see the performing provider's procedural report for the fluoroscopy time utilized. FINDINGS: Four intraoperative spot fluoroscopic images of the cervical spine are provided. C6-7 ACDF is again noted. There has been interval posterior spinal fusion from C5-T2. Bilateral articular pillar/pedicle screws are present at C5, T1, and T2, and there is a single right-sided screw at C6. IMPRESSION: Intraoperative images during  C5-T2 posterior fusion. Electronically Signed   By: Logan Bores M.D.   On: 02/18/2017 11:53   Dg Cervical Spine 2-3 Views  Result Date: 02/17/2017 CLINICAL DATA:  C6-7 ACDF EXAM: DG C-ARM 61-120 MIN; CERVICAL SPINE - 2-3 VIEW COMPARISON:  02/12/2017 CT FINDINGS: First lateral intraoperative image demonstrates anterior localizing instrument directed at the C6-7 level. Final image demonstrates changes of anterior fusion at C6-7. No hardware bony complicating feature noted. IMPRESSION: C6-7 ACDF.  No visible complicating feature. Electronically Signed   By: Rolm Baptise M.D.   On: 02/17/2017 15:15   Ct Cervical Spine Wo Contrast  Result Date: 02/12/2017 CLINICAL DATA:  History of metastatic lung cancer, preoperative evaluation for C7 tumor. EXAM: CT CERVICAL SPINE WITHOUT CONTRAST TECHNIQUE: Multidetector CT imaging of the cervical spine was performed without intravenous  contrast. Multiplanar CT image reconstructions were also generated. COMPARISON:  MRI of the cervical spine February 04, 2017 FINDINGS: ALIGNMENT: Maintained lordosis. Vertebral bodies in alignment. SKULL BASE AND VERTEBRAE: Expansile permeative C7 lesion within LEFT pedicle to posterior elements with pathologic fracture through LEFT lamina scanned spinous process again noted. Known C6 spinous process metastasis not apparent by CT. Vertebral bodies intact. Osteopenia. Moderate C6-7 disc height loss with vacuum disc and endplate spurring compatible with degenerative disc, mild at C5-6. C1-2 articulation maintained with mild arthropathy. SOFT TISSUES AND SPINAL CANAL: 3.3 cm RIGHT thyroid nodule, smaller LEFT thyroid nodules, these were better characterized on recent CT chest. DISC LEVELS: No significant osseous canal stenosis or neural foraminal narrowing. C2-3, C3-4, C4-5: No disc bulge, canal stenosis nor neural foraminal narrowing. Mild C3-4 and moderate LEFT greater than RIGHT C4-5 facet arthropathy. C5-6: Uncovertebral hypertrophy, mild RIGHT greater than LEFT facet arthropathy without canal stenosis. Mild neural foraminal narrowing. C6-7: Infiltrative tumor LEFT middle and posterior column without canal stenosis though, soft tissue effaces the LEFT lateral recess and neural foramen. Mild RIGHT neural foraminal narrowing. C7-T1: No disc bulge, canal stenosis nor neural foraminal narrowing. UPPER CHEST: RIGHT upper lobe consolidation and pleural thickening better characterized on CT chest January 20, 2017 OTHER: None. IMPRESSION: 1. C7 osseous metastasis with epidural extension to LEFT neural foramen, pathologic fracture through LEFT middle and posterior column without vertebral body height loss. 2. Known additional metastasis not apparent by CT. Electronically Signed   By: Elon Alas M.D.   On: 02/12/2017 14:16   Dg C-arm 1-60 Min  Result Date: 02/17/2017 CLINICAL DATA:  C6-7 ACDF EXAM: DG C-ARM  61-120 MIN; CERVICAL SPINE - 2-3 VIEW COMPARISON:  02/12/2017 CT FINDINGS: First lateral intraoperative image demonstrates anterior localizing instrument directed at the C6-7 level. Final image demonstrates changes of anterior fusion at C6-7. No hardware bony complicating feature noted. IMPRESSION: C6-7 ACDF.  No visible complicating feature. Electronically Signed   By: Rolm Baptise M.D.   On: 02/17/2017 15:15   Dg C-arm 61-120 Min  Result Date: 02/18/2017 CLINICAL DATA:  Posterior spinal fusion. EXAM: CERVICAL SPINE - 2-3 VIEW; DG C-ARM 61-120 MIN COMPARISON:  Earlier intraoperative images from today in yesterday. Cervical spine CT 02/12/2017. FLUOROSCOPY TIME:  C-arm fluoroscopic images were obtained intraoperatively and submitted for post operative interpretation. Please see the performing provider's procedural report for the fluoroscopy time utilized. FINDINGS: Four intraoperative spot fluoroscopic images of the cervical spine are provided. C6-7 ACDF is again noted. There has been interval posterior spinal fusion from C5-T2. Bilateral articular pillar/pedicle screws are present at C5, T1, and T2, and there is a single right-sided screw at C6. IMPRESSION: Intraoperative  images during C5-T2 posterior fusion. Electronically Signed   By: Logan Bores M.D.   On: 02/18/2017 11:53    Impression:  Metastatic adenomacarcinoma of the lung. Pt has had successful surgical decompression of her left C7 nerve impingement with improvement in her pain and neurologic function. The pt will be a good candidate for post-op radiation therapy. I discussed course of treatment, side effects, and toxicity of the treatement in this situation with the pt. She appears to understand and wishes to proceed with planned course of treatment.   Plan: Pt is tentatively scheduled for simulation on 04/05/2017 at 11 a.m but I would like to move this appointment up if her surgeon (Dr. Phylliss Bob) is agreeable as this will be approximately  6-1/2 weeks out from her surgery.   -----------------------------------  Blair Promise, PhD, MD  This document serves as a record of services personally performed by Gery Pray, MD. It was created on his behalf by Valeta Harms, a trained medical scribe. The creation of this record is based on the scribe's personal observations and the provider's statements to them. This document has been checked and approved by the attending provider.

## 2017-03-11 NOTE — Telephone Encounter (Signed)
Yes, this is the same type of medication.  Prescription sent

## 2017-03-12 ENCOUNTER — Other Ambulatory Visit: Payer: Self-pay | Admitting: Family

## 2017-03-12 ENCOUNTER — Ambulatory Visit (HOSPITAL_BASED_OUTPATIENT_CLINIC_OR_DEPARTMENT_OTHER): Payer: Medicare Other

## 2017-03-12 VITALS — BP 132/78 | HR 90 | Temp 97.6°F | Resp 20

## 2017-03-12 DIAGNOSIS — D509 Iron deficiency anemia, unspecified: Secondary | ICD-10-CM | POA: Insufficient documentation

## 2017-03-12 DIAGNOSIS — D508 Other iron deficiency anemias: Secondary | ICD-10-CM

## 2017-03-12 DIAGNOSIS — C3431 Malignant neoplasm of lower lobe, right bronchus or lung: Secondary | ICD-10-CM

## 2017-03-12 LAB — IRON AND TIBC
%SAT: 9 % — AB (ref 21–57)
Iron: 25 ug/dL — ABNORMAL LOW (ref 41–142)
TIBC: 266 ug/dL (ref 236–444)
UIBC: 241 ug/dL (ref 120–384)

## 2017-03-12 LAB — FERRITIN: FERRITIN: 194 ng/mL (ref 9–269)

## 2017-03-12 MED ORDER — SODIUM CHLORIDE 0.9 % IV SOLN
510.0000 mg | Freq: Once | INTRAVENOUS | Status: AC
  Administered 2017-03-12: 510 mg via INTRAVENOUS
  Filled 2017-03-12: qty 17

## 2017-03-12 MED ORDER — SODIUM CHLORIDE 0.9 % IV SOLN
Freq: Once | INTRAVENOUS | Status: AC
  Administered 2017-03-12: 13:00:00 via INTRAVENOUS

## 2017-03-12 NOTE — Patient Instructions (Signed)

## 2017-03-15 ENCOUNTER — Other Ambulatory Visit: Payer: Self-pay | Admitting: Hematology & Oncology

## 2017-03-15 DIAGNOSIS — C7951 Secondary malignant neoplasm of bone: Principal | ICD-10-CM

## 2017-03-15 DIAGNOSIS — C3490 Malignant neoplasm of unspecified part of unspecified bronchus or lung: Secondary | ICD-10-CM

## 2017-03-16 ENCOUNTER — Other Ambulatory Visit: Payer: Self-pay | Admitting: *Deleted

## 2017-03-16 DIAGNOSIS — C3431 Malignant neoplasm of lower lobe, right bronchus or lung: Secondary | ICD-10-CM

## 2017-03-17 ENCOUNTER — Encounter (HOSPITAL_COMMUNITY): Payer: Self-pay

## 2017-03-17 ENCOUNTER — Ambulatory Visit (HOSPITAL_BASED_OUTPATIENT_CLINIC_OR_DEPARTMENT_OTHER): Payer: Medicare Other | Admitting: Hematology & Oncology

## 2017-03-17 ENCOUNTER — Other Ambulatory Visit (HOSPITAL_BASED_OUTPATIENT_CLINIC_OR_DEPARTMENT_OTHER): Payer: Medicare Other

## 2017-03-17 ENCOUNTER — Other Ambulatory Visit: Payer: Self-pay

## 2017-03-17 ENCOUNTER — Ambulatory Visit: Payer: Medicare Other

## 2017-03-17 VITALS — BP 144/77 | HR 108 | Temp 98.2°F | Resp 18 | Wt 120.2 lb

## 2017-03-17 DIAGNOSIS — D500 Iron deficiency anemia secondary to blood loss (chronic): Secondary | ICD-10-CM

## 2017-03-17 DIAGNOSIS — C3431 Malignant neoplasm of lower lobe, right bronchus or lung: Secondary | ICD-10-CM | POA: Diagnosis not present

## 2017-03-17 DIAGNOSIS — C7951 Secondary malignant neoplasm of bone: Secondary | ICD-10-CM | POA: Diagnosis not present

## 2017-03-17 DIAGNOSIS — C3490 Malignant neoplasm of unspecified part of unspecified bronchus or lung: Secondary | ICD-10-CM

## 2017-03-17 LAB — CMP (CANCER CENTER ONLY)
ALBUMIN: 3.1 g/dL — AB (ref 3.3–5.5)
ALT(SGPT): 17 U/L (ref 10–47)
AST: 19 U/L (ref 11–38)
Alkaline Phosphatase: 79 U/L (ref 26–84)
BUN, Bld: 14 mg/dL (ref 7–22)
CALCIUM: 10 mg/dL (ref 8.0–10.3)
CHLORIDE: 97 meq/L — AB (ref 98–108)
CO2: 27 meq/L (ref 18–33)
Creat: 0.4 mg/dl — ABNORMAL LOW (ref 0.6–1.2)
GLUCOSE: 111 mg/dL (ref 73–118)
POTASSIUM: 4.1 meq/L (ref 3.3–4.7)
Sodium: 138 mEq/L (ref 128–145)
Total Bilirubin: 0.5 mg/dl (ref 0.20–1.60)
Total Protein: 7.4 g/dL (ref 6.4–8.1)

## 2017-03-17 LAB — CBC WITH DIFFERENTIAL (CANCER CENTER ONLY)
BASO#: 0 10*3/uL (ref 0.0–0.2)
BASO%: 0.2 % (ref 0.0–2.0)
EOS ABS: 0 10*3/uL (ref 0.0–0.5)
EOS%: 0.3 % (ref 0.0–7.0)
HEMATOCRIT: 34.2 % — AB (ref 34.8–46.6)
HEMOGLOBIN: 11 g/dL — AB (ref 11.6–15.9)
LYMPH#: 0.5 10*3/uL — AB (ref 0.9–3.3)
LYMPH%: 4.4 % — ABNORMAL LOW (ref 14.0–48.0)
MCH: 28.2 pg (ref 26.0–34.0)
MCHC: 32.2 g/dL (ref 32.0–36.0)
MCV: 88 fL (ref 81–101)
MONO#: 0.7 10*3/uL (ref 0.1–0.9)
MONO%: 5.8 % (ref 0.0–13.0)
NEUT%: 89.3 % — AB (ref 39.6–80.0)
NEUTROS ABS: 10.7 10*3/uL — AB (ref 1.5–6.5)
Platelets: 401 10*3/uL — ABNORMAL HIGH (ref 145–400)
RBC: 3.9 10*6/uL (ref 3.70–5.32)
RDW: 16.9 % — AB (ref 11.1–15.7)
WBC: 12 10*3/uL — ABNORMAL HIGH (ref 3.9–10.0)

## 2017-03-17 NOTE — Progress Notes (Signed)
Dictation #1 [id number]  [id number]  Hematology and Oncology Follow Up Visit  Samantha Quinn [id number] 1927-08-25 83 y.o. 03/17/2017   Principle Diagnosis:   Stage IV adenocarcinoma of the right lung-mutation negative  C6-7 left nerve root compression secondary to metastatic disease - PD-L1 (0%)  Current Therapy:    Tecentriq 1200 mg IV every 3 weeks s/p cycle #8  Status post radiation/chemotherapy for recurrence-completed October 2017  Status post right upper lobectomy in March 2013 for stage IB disease  XRT to the cervical spine  Zometa 4 mg IV q month     Interim History:  Samantha Quinn is back for a visit.  I feel bad for her.  She is quite frustrated with having to wear the cervical collar.  She still needs to wear this.  Is been a month now since her surgery.  Apparently, radiation therapy is not going to start for another 2 or 3 weeks.  I made a call to Dr. Sondra Come and left him a message to see if radiation can start sooner.  I am not sure why it would take this long to start radiation.  I do need to get her on Zometa.  I will get this started the next time I see her.  We have not yet gone back are [facility] testing.  We have gotten back the PD-L1 analysis which was 0%.  I think this probably is a reason why she had the metastatic disease.  She has had no cough.  There is been no bleeding.  Her appetite is not as good as it needs to be.  She has just a hard time eating with this cervical collar on.    She comes in with a daughter-in-law.  Her daughter-in-law is doing a great job in [employer], [employer].  She did feel better after she got some IV iron.  We are following this.  She is due for a PET scan in 1 week or so.  Currently, her performance status is ECOG 2.  Medications:  Current Outpatient Medications:  .  amitriptyline (ELAVIL) 10 MG tablet, TAKE 1 TABLET BY MOUTH EVERYDAY AT BEDTIME, Disp: 90 tablet, Rfl: 0 .  bisoprolol-hydrochlorothiazide (ZIAC)  2.5-6.25 MG tablet, TAKE 1 TABLET DAILY, Disp: 90 tablet, Rfl: 3 .  Blood Glucose Monitoring Suppl (ONE TOUCH ULTRA 2) w/Device KIT, Use to check blood sugars twice a day Dx E11.9, Disp: 1 each, Rfl: 0 .  calcium carbonate (OS-CAL) 600 MG tablet, Take 600 mg by mouth daily., Disp: , Rfl:  .  gabapentin (NEURONTIN) 100 MG capsule, Take 100 mg by mouth 3 (three) times daily. , Disp: , Rfl:  .  glucose blood (ONE TOUCH ULTRA TEST) test strip, 1 each by Other route 2 (two) times daily. Use to check blood sugars twice a day, Disp: 100 each, Rfl: 3 .  Lancets (ONETOUCH ULTRASOFT) lancets, 1 each by Other route 2 (two) times daily. Use to help check blood sugars twice a day, Disp: 100 each, Rfl: 3 .  lovastatin (MEVACOR) 20 MG tablet, TAKE 1 TABLET BY MOUTH EVERY OTHER DAY, Disp: 90 tablet, Rfl: 1 .  metFORMIN (GLUCOPHAGE) 500 MG tablet, TAKE 2 TABLET 2 TIMES DAILY, Disp: 180 tablet, Rfl: 1 .  methimazole (TAPAZOLE) 5 MG tablet, Take 0.5 tablets (2.5 mg total) by mouth daily. (Patient taking differently: Take 5 mg by mouth every other day. ), Disp: 30 tablet, Rfl: 3 .  Multiple Vitamins-Minerals (VISION-VITE PRESERVE PO), Take 1 tablet by mouth  daily. , Disp: , Rfl:  .  ramipril (ALTACE) 5 MG capsule, Take 5 mg by mouth every evening., Disp: , Rfl:   Allergies:  Allergies  Allergen Reactions  . Tramadol Other (See Comments)    syncope  . Codeine Nausea And Vomiting  . Demerol Nausea Only    Past Medical History, Surgical history, Social history, and Family History were reviewed and updated.  Review of Systems: As stated in the interim history  Physical Exam:  weight is 120 lb 4 oz (54.5 kg). Her oral temperature is 98.2 F (36.8 C). Her blood pressure is 144/77 (abnormal) and her pulse is 108 (abnormal). Her respiration is 18 and oxygen saturation is 94%.   Wt Readings from Last 3 Encounters:  03/17/17 120 lb 4 oz (54.5 kg)  03/11/17 120 lb 12.8 oz (54.8 kg)  03/03/17 123 lb 0.6 oz (55.8  kg)     Physical Exam  Constitutional: She is oriented to person, place, and time.  HENT:  Head: Normocephalic and atraumatic.  Mouth/Throat: Oropharynx is clear and moist.  Eyes: EOM are normal. Pupils are equal, round, and reactive to light.  Neck: Normal range of motion.  Cardiovascular: Normal rate, regular rhythm and normal heart sounds.  Pulmonary/Chest: Effort normal and breath sounds normal.  Abdominal: Soft. Bowel sounds are normal.  Musculoskeletal: Normal range of motion. She exhibits no edema, tenderness or deformity.  She does have tenderness in the posterior left shoulder. She has moderate decreased range of motion of the left shoulder. She has partial abduction of the left arm.  Lymphadenopathy:    She has no cervical adenopathy.  Neurological: She is alert and oriented to person, place, and time.  Skin: Skin is warm and dry. No rash noted. No erythema.  Psychiatric: She has a normal mood and affect. Her behavior is normal. Judgment and thought content normal.  Vitals reviewed.    Lab Results  Component Value Date   WBC 12.0 (H) 03/17/2017   HGB 11.0 (L) 03/17/2017   HCT 34.2 (L) 03/17/2017   MCV 88 03/17/2017   PLT 401 (H) 03/17/2017     Chemistry      Component Value Date/Time   NA 138 03/17/2017 1156   NA 135 (L) 03/11/2017 1416   K 4.1 03/17/2017 1156   K 3.9 03/11/2017 1416   CL 97 (L) 03/17/2017 1156   CO2 27 03/17/2017 1156   CO2 23 03/11/2017 1416   BUN 14 03/17/2017 1156   BUN 16.1 03/11/2017 1416   CREATININE 0.4 (L) 03/17/2017 1156   CREATININE 0.9 03/11/2017 1416      Component Value Date/Time   CALCIUM 10.0 03/17/2017 1156   CALCIUM 9.6 03/11/2017 1416   ALKPHOS 79 03/17/2017 1156   ALKPHOS 79 03/11/2017 1416   AST 19 03/17/2017 1156   AST 11 03/11/2017 1416   ALT 17 03/17/2017 1156   ALT 11 03/11/2017 1416   BILITOT 0.50 03/17/2017 1156   BILITOT 0.26 03/11/2017 1416         Impression and Plan: Samantha Quinn is a 83 year-old  white female. She has progressive non-small cell lung cancer. This is adenocarcinoma.  At this point, it is most important for her to get better nutrition.  I really talked a long time to her about this.  I told her that good protein and complex carbohydrates would be the best.  I told her that oatmeal would be excellent.  She really likes oatmeal.  I also told her  daughter-in-law to go to [facility] to and by some weight gain powder that body builders use.  There is very little sugar in this.  I told her not to worry about her blood sugars.  From my perspective, they are not high.  I would much rather than be a little high than a little low.  I think for Samantha Quinn, low blood sugars would really be difficult to tolerate.  As such, I told her to take metformin 1 (500 mg) twice a day.  She does not need to take anything else.  I think her life will get a whole lot better once this cervical collar comes off.  I would like to see her back in 3 weeks.  I spent a good 40 minutes with she and her daughter-in-law.  I answered all of their questions.  I reassured her as much as I could regarding her situation.  I encouraged her to keep eating as much as possible and small frequent meals is the best way to go.   [name], MD 11/21/201812:52 PM

## 2017-03-23 ENCOUNTER — Telehealth: Payer: Self-pay | Admitting: Oncology

## 2017-03-23 NOTE — Telephone Encounter (Signed)
Left a message for Pricilla Holm, PA-C to see if patient has been cleared to start radiation.

## 2017-03-26 ENCOUNTER — Telehealth: Payer: Self-pay | Admitting: Oncology

## 2017-03-26 NOTE — Telephone Encounter (Signed)
Called patient and advised her that her [place] appointment has been rescheduled for Monday, 03/29/17 at 9 am.  She verbalized understanding and agreement.

## 2017-03-26 NOTE — Telephone Encounter (Signed)
Spoke with Mariel Craft, PA-C.  She said patient is OK to start radiation per Dr. Lynann Bologna.

## 2017-03-29 ENCOUNTER — Ambulatory Visit
Admission: RE | Admit: 2017-03-29 | Discharge: 2017-03-29 | Disposition: A | Discharge status: Home / Self Care | Payer: Medicare Other | Source: Ambulatory Visit | Attending: Radiation Oncology | Admitting: Radiation Oncology

## 2017-03-29 DIAGNOSIS — C3490 Malignant neoplasm of unspecified part of unspecified bronchus or lung: Secondary | ICD-10-CM | POA: Diagnosis not present

## 2017-03-29 DIAGNOSIS — Z79899 Other long term (current) drug therapy: Secondary | ICD-10-CM | POA: Diagnosis not present

## 2017-03-29 DIAGNOSIS — C7951 Secondary malignant neoplasm of bone: Secondary | ICD-10-CM | POA: Diagnosis not present

## 2017-03-29 DIAGNOSIS — Z510 Encounter for antineoplastic radiation therapy: Secondary | ICD-10-CM | POA: Diagnosis not present

## 2017-03-29 DIAGNOSIS — C3431 Malignant neoplasm of lower lobe, right bronchus or lung: Secondary | ICD-10-CM | POA: Diagnosis not present

## 2017-03-29 NOTE — Progress Notes (Signed)
  Radiation Oncology         [phone]) [phone] ________________________________  Name: Samantha Quinn MRN: [id number]  Date: 03/29/2017  DOB: 1927-12-20  SIMULATION AND TREATMENT PLANNING NOTE    ICD-10-CM   1. Lung cancer metastatic to bone ([place]) C34.90    C79.51     DIAGNOSIS: Recurrent non-small cell lung cancer with bone metastasis   NARRATIVE:  The patient was brought to the [place] suite.  Identity was confirmed.  All relevant records and images related to the planned course of therapy were reviewed.  The patient freely provided informed written consent to proceed with treatment after reviewing the details related to the planned course of therapy. The consent form was witnessed and verified by the simulation staff.  Then, the patient was set-up in a stable reproducible  supine position for radiation therapy.  CT images were obtained.  Surface markings were placed.  The CT images were loaded into the planning software.  Then the target and avoidance structures were contoured.  Treatment planning then occurred.  The radiation prescription was entered and confirmed.  Then, I designed and supervised the construction of a total of 3 medically necessary complex treatment devices.  I have requested : 3D Simulation  I have requested a DVH of the following structures: CTV, spinal cord, larynx.  I have ordered: dose calc.   PLAN:  The patient will receive 35 Gy in 14 fractions.  -----------------------------------  Blair Promise, PhD, MD  This document serves as a record of services personally performed by Gery Pray, MD. It was created on his behalf by Reola Mosher, a trained medical scribe. The creation of this record is based on the scribe's personal observations and the provider's statements to them. This document has been checked and approved by the attending provider.

## 2017-03-30 ENCOUNTER — Ambulatory Visit (HOSPITAL_COMMUNITY)
Admission: RE | Admit: 2017-03-30 | Discharge: 2017-03-30 | Disposition: A | Discharge status: Home / Self Care | Payer: Medicare Other | Source: Ambulatory Visit | Attending: Hematology & Oncology | Admitting: Hematology & Oncology

## 2017-03-30 DIAGNOSIS — C7951 Secondary malignant neoplasm of bone: Secondary | ICD-10-CM | POA: Insufficient documentation

## 2017-03-30 DIAGNOSIS — J90 Pleural effusion, not elsewhere classified: Secondary | ICD-10-CM | POA: Insufficient documentation

## 2017-03-30 DIAGNOSIS — M542 Cervicalgia: Secondary | ICD-10-CM | POA: Diagnosis not present

## 2017-03-30 DIAGNOSIS — C3490 Malignant neoplasm of unspecified part of unspecified bronchus or lung: Secondary | ICD-10-CM | POA: Diagnosis not present

## 2017-03-30 DIAGNOSIS — Z01812 Encounter for preprocedural laboratory examination: Secondary | ICD-10-CM | POA: Insufficient documentation

## 2017-03-30 LAB — GLUCOSE, CAPILLARY: GLUCOSE-CAPILLARY: 105 mg/dL — AB (ref 65–99)

## 2017-03-30 MED ORDER — FLUDEOXYGLUCOSE F - 18 (FDG) INJECTION
5.9000 | Freq: Once | INTRAVENOUS | Status: AC | PRN
  Administered 2017-03-30: 5.9 via INTRAVENOUS

## 2017-03-31 ENCOUNTER — Encounter: Payer: Self-pay | Admitting: Internal Medicine

## 2017-03-31 ENCOUNTER — Ambulatory Visit (INDEPENDENT_AMBULATORY_CARE_PROVIDER_SITE_OTHER): Payer: Medicare Other | Admitting: Internal Medicine

## 2017-03-31 VITALS — BP 136/82 | HR 86 | Temp 97.8°F | Resp 16 | Wt 119.0 lb

## 2017-03-31 DIAGNOSIS — Z79899 Other long term (current) drug therapy: Secondary | ICD-10-CM | POA: Diagnosis not present

## 2017-03-31 DIAGNOSIS — C3490 Malignant neoplasm of unspecified part of unspecified bronchus or lung: Secondary | ICD-10-CM | POA: Diagnosis not present

## 2017-03-31 DIAGNOSIS — E7800 Pure hypercholesterolemia, unspecified: Secondary | ICD-10-CM

## 2017-03-31 DIAGNOSIS — E119 Type 2 diabetes mellitus without complications: Secondary | ICD-10-CM

## 2017-03-31 DIAGNOSIS — I10 Essential (primary) hypertension: Secondary | ICD-10-CM

## 2017-03-31 DIAGNOSIS — C7951 Secondary malignant neoplasm of bone: Secondary | ICD-10-CM | POA: Diagnosis not present

## 2017-03-31 DIAGNOSIS — C3431 Malignant neoplasm of lower lobe, right bronchus or lung: Secondary | ICD-10-CM | POA: Diagnosis not present

## 2017-03-31 DIAGNOSIS — Z510 Encounter for antineoplastic radiation therapy: Secondary | ICD-10-CM | POA: Diagnosis not present

## 2017-03-31 MED ORDER — METFORMIN HCL 500 MG PO TABS: 500.0000 mg | ORAL_TABLET | Freq: Two times a day (BID) | ORAL | 1 refills | Status: DC

## 2017-03-31 NOTE — Progress Notes (Signed)
Subjective:    Patient ID: Samantha Quinn, female    DOB: 08-26-1927, 83 y.o.   MRN: [id number]  HPI The patient is here for follow up.  Hypertension: She is taking her medication daily. She is compliant with a low sodium diet.  She denies chest pain, palpitations, edema, and regular headaches. She is not exercising regularly.    Diabetes: She is taking her medication daily as prescribed.  She is taking 1 metformin twice daily.  She is compliant with a diabetic diet. She is not exercising regularly. She monitors her sugars and they have been running 126.    Hyperlipidemia: She is taking her medication daily.   Metastatic adenocarcinoma lung cancer:  She is wearing a soft collar now for one more week from her cervical spine surgery.   She will start radiation tomorrow on her neck.  Her appetite is not good - it has improved minimally.  She is up-to-date with her oncology visits.  She is trying to gain some of the weight back that she lost from her surgery.  She is trying to eat high protein foods, but a lot of the times does not have a good appetite.  Her energy level is low.  She has her chronic shortness of breath and it is slightly worse since surgery.  Medications and allergies reviewed with patient and updated if appropriate.  Patient Active Problem List   Diagnosis Date Noted  . IDA (iron deficiency anemia) 03/12/2017  . Radiculopathy 02/17/2017  . Lung cancer metastatic to bone ([facility]) 02/05/2017  . Thyrotoxicosis without thyroid storm 01/18/2017  . Bilateral hearing loss 08/27/2016  . Nasal fracture 08/27/2016  . Knee injury 08/27/2016  . Head injury 08/27/2016  . Toxic multinodular goiter w/o crisis 06/22/2016  . Fatigue 05/05/2016  . Macular degeneration 03/09/2016  . Encounter for antineoplastic chemotherapy 12/19/2015  . Meralgia paresthetica of left side 07/10/2015  . Osteoporosis 07/10/2015  . Onychomycosis 07/10/2015  . Anxiety 07/10/2015  . Diabetes ([place]) 07/10/2015   . Cough 12/12/2013  . Pneumothorax after biopsy 07/09/2011    Class: Acute  . Primary adenocarcinoma of lower lobe of right lung ([place]) 06/24/2011  . Hypercholesterolemia 10/20/2010  . Essential hypertension, benign 10/20/2010  . Osteoarthritis 10/20/2010    Current Outpatient Medications on File Prior to Visit  Medication Sig Dispense Refill  . amitriptyline (ELAVIL) 10 MG tablet TAKE 1 TABLET BY MOUTH EVERYDAY AT BEDTIME 90 tablet 0  . bisoprolol-hydrochlorothiazide (ZIAC) 2.5-6.25 MG tablet TAKE 1 TABLET DAILY 90 tablet 3  . Blood Glucose Monitoring Suppl (ONE TOUCH ULTRA 2) w/Device KIT Use to check blood sugars twice a day Dx E11.9 1 each 0  . calcium carbonate (OS-CAL) 600 MG tablet Take 600 mg by mouth daily.    . cholecalciferol (VITAMIN D) 1000 units tablet Take 1,000 Units by mouth daily.    [name] gabapentin (NEURONTIN) 100 MG capsule Take 100 mg by mouth 3 (three) times daily.     [name] glucose blood (ONE TOUCH ULTRA TEST) test strip 1 each by Other route 2 (two) times daily. Use to check blood sugars twice a day 100 each 3  . Lancets (ONETOUCH ULTRASOFT) lancets 1 each by Other route 2 (two) times daily. Use to help check blood sugars twice a day 100 each 3  . lovastatin (MEVACOR) 20 MG tablet TAKE 1 TABLET BY MOUTH EVERY OTHER DAY 90 tablet 1  . methimazole (TAPAZOLE) 5 MG tablet Take 0.5 tablets (2.5 mg total) by  mouth daily. (Patient taking differently: Take 5 mg by mouth every other day. ) 30 tablet 3  . Multiple Vitamins-Minerals (VISION-VITE PRESERVE PO) Take 1 tablet by mouth daily.     . Probiotic Product (ALIGN PO) Take by mouth daily.    . ramipril (ALTACE) 5 MG capsule Take 5 mg by mouth every evening.     No current facility-administered medications on file prior to visit.     Past Medical History:  Diagnosis Date  . Anxiety   . Back pain    bulding disc  . Bronchitis    hx of-many,many years   . Diabetes mellitus   . Diabetes mellitus    type 2 and takes  Metformin bid  . Dyslipidemia   . Embolism - blood clot    hx of in left lower leg and was on Coumadin for about 75month;this was about 8-147yrago  . Family history of adverse reaction to anesthesia    Daughter- Nausea  . History of colonic polyps   . History of radiation therapy 01/06/16-02/19/16   The primary tumor and involved mediastinal adenopathy were treated to 66 Gy in 33 fractions of 2 Gy.  . Marland KitchenOH (hard of hearing)   . Hx of migraines    as a teenager  . Hypercholesterolemia   . Hyperlipidemia    takes Lovastatin every other day  . Hypertension    takes Ziac daily and Ramipril as well  . Hyperthyroidism   . Hypothyroidism    takes Synthroid every other day  . Insomnia    takes Elavil nightly  . Joint pain   . Lung mass   . Myalgia   . Osteoarthritis   . Osteoporosis   . Pneumothorax of right lung after biopsy 07/09/11   HAD CHEST TUBE PLACEMENT  . PONV (postoperative nausea and vomiting)   . Primary adenocarcinoma of [place] lobe of right lung ([facility]/27[place]2013   pT2a, pN0 M0 Stage 1 Well differenced Adenocarcinoma 3.5 cm resected 07/17/2011  . Primary adenocarcinoma of lung ([facility]/27/2013  . Shortness of breath dyspnea    With exertion  . Skin cancer    legs have dark spots on them  . Upper respiratory infection 04/2011  . Urinary incontinence    takes Detrol daily prn    Past Surgical History:  Procedure Laterality Date  . ABDOMINAL HYSTERECTOMY    . ANTERIOR CERVICAL DECOMP/DISCECTOMY FUSION N/A 02/17/2017   Procedure: ANTERIOR CERVICAL DECOMPRESSION FUSION, CERVICAL 6-7 WITH INSTRUMENTATION AND ALLOGRAFT;  Surgeon: DuPhylliss BobMD;  [facility] Service: [facility]ics;  Laterality: N/A;  ANTERIOR CERVICAL DECOMPRESSION FUSION, CERVICAL 6-7 WITH INSTRUMENTATION AND ALLOGRAFT; REQUEST 2 HOURS AND FLIP   . APPENDECTOMY     as a child  . CHEST TUBE REMOVAL  07/13/11   RIGHT  CHEST TUBE  . CHOLECYSTECTOMY    . COLONOSCOPY    . COLONOSCOPY    . DILATION AND  CURETTAGE OF UTERUS     x 2  . EYE SURGERY Bilateral    Cataract  . HEMORRHOID SURGERY    . LUNG BIOPSY  07/09/11   RIGHT UPER LOBE LUNG MASS   . LUNG LOBECTOMY Right 06/2011  . POSTERIOR CERVICAL FUSION/FORAMINOTOMY N/A 02/18/2017   Procedure: CERVICAL Five-Six, CERVICAL Six-Seven, CERVICAL Seven-THORACIC One, THORACIC One-Two POSTERIOR SPINAL FUSION WITH INSTRUMENTATION AND ALLOGRAFT;  Surgeon: DuPhylliss BobMD;  [facility] Service: Orthopedics;  Laterality: N/A;  . RIGHT CHEST TUBE PLACEMENT  07/10/11   S/P  LUNG BX  . ROTATOR CUFF REPAIR     bilateral  . TOE SURGERY  2013   right small toe  . TONSILLECTOMY     as a child  . VIDEO BRONCHOSCOPY  07/17/2011   Procedure: VIDEO BRONCHOSCOPY;  Surgeon: Grace Isaac, MD;  Location: [facility];  Service: Thoracic;  Laterality: N/A;  . VIDEO BRONCHOSCOPY WITH ENDOBRONCHIAL ULTRASOUND N/A 12/10/2015   Procedure: VIDEO BRONCHOSCOPY WITH ENDOBRONCHIAL ULTRASOUND with biopsies.;  Surgeon: Grace Isaac, MD;  Location: [facility] OR;  Service: Thoracic;  Laterality: N/A;    Social History   Socioeconomic History  . Marital status: Widowed    Spouse name: None  . Number of children: None  . Years of education: None  . Highest education level: None  Social Needs  . Financial resource strain: None  . Food insecurity - worry: None  . Food insecurity - inability: None  . Transportation needs - medical: None  . Transportation needs - non-medical: None  Occupational History  . None  Tobacco Use  . Smoking status: Never Smoker  . Smokeless tobacco: Never Used  Substance and Sexual Activity  . Alcohol use: No    Alcohol/week: 0.0 oz    Comment: "rarely have a glass of wine"  . Drug use: No  . Sexual activity: No    Birth control/protection: Surgical  Other Topics Concern  . None  Social History Narrative  . None    Family History  Problem Relation Age of Onset  . Hypertension Mother   . Hypertension Father   . Arthritis  Sister   . Arthritis Sister   . Diabetes Brother   . Hypertension Sister   . Anesthesia problems Neg Hx   . Hypotension Neg Hx   . Malignant hyperthermia Neg Hx   . Pseudochol deficiency Neg Hx   . Heart attack Neg Hx     Review of Systems  Constitutional: Negative for chills and fever.  Respiratory: Positive for shortness of breath (with exertion). Negative for cough and wheezing.   Cardiovascular: Negative for chest pain, palpitations and leg swelling.  Gastrointestinal: Negative for abdominal pain, constipation and diarrhea.  Neurological: Negative for light-headedness and headaches.       Objective:   Vitals:   03/31/17 1400  BP: 136/82  Pulse: 86  Resp: 16  Temp: 97.8 F (36.6 C)  SpO2: 92%   Wt Readings from Last 3 Encounters:  03/31/17 119 lb (54 kg)  03/17/17 120 lb 4 oz (54.5 kg)  03/11/17 120 lb 12.8 oz (54.8 kg)   Body mass index is 22.48 kg/m.   Physical Exam    Constitutional: Appears well-developed and well-nourished. No distress.  HENT:  Head: Normocephalic and atraumatic.  Neck: Neck supple. No tracheal deviation present. No thyromegaly present.  No cervical lymphadenopathy Cardiovascular: Normal rate, regular rhythm and normal heart sounds.   No murmur heard. No carotid bruit .  No edema Pulmonary/Chest: Effort normal and breath sounds normal. No respiratory distress. No has no wheezes. No rales.  Skin: Skin is warm and dry. Not diaphoretic.  Psychiatric: Normal mood and affect. Behavior is normal.      Assessment & Plan:    See Problem List for Assessment and Plan of chronic medical problems.

## 2017-03-31 NOTE — Assessment & Plan Note (Signed)
A1c well controlled Sugars well controlled at home Taking metformin 1 pill twice daily Encouraged her not to worry about her sugars-only concentrate on protein intake-okay if sugars are a little bit higher than what they are now Okay to skip metformin if needed during her radiation treatment if she is not able to swallow the pill or if she is not eating that much

## 2017-03-31 NOTE — Assessment & Plan Note (Signed)
Continue statin. 

## 2017-03-31 NOTE — Assessment & Plan Note (Signed)
Blood pressure well controlled Continue current medication

## 2017-03-31 NOTE — Patient Instructions (Signed)
  Medications reviewed and updated.  No changes recommended at this time.     Please followup in 6 months   

## 2017-04-01 ENCOUNTER — Ambulatory Visit
Admission: RE | Admit: 2017-04-01 | Discharge: 2017-04-01 | Disposition: A | Discharge status: Home / Self Care | Payer: Medicare Other | Source: Ambulatory Visit | Attending: Radiation Oncology | Admitting: Radiation Oncology

## 2017-04-01 DIAGNOSIS — C3490 Malignant neoplasm of unspecified part of unspecified bronchus or lung: Secondary | ICD-10-CM

## 2017-04-01 DIAGNOSIS — C7951 Secondary malignant neoplasm of bone: Principal | ICD-10-CM

## 2017-04-01 DIAGNOSIS — C3431 Malignant neoplasm of lower lobe, right bronchus or lung: Secondary | ICD-10-CM | POA: Diagnosis not present

## 2017-04-01 DIAGNOSIS — Z79899 Other long term (current) drug therapy: Secondary | ICD-10-CM | POA: Diagnosis not present

## 2017-04-01 DIAGNOSIS — Z510 Encounter for antineoplastic radiation therapy: Secondary | ICD-10-CM | POA: Diagnosis not present

## 2017-04-01 NOTE — Progress Notes (Signed)
  Radiation Oncology         [phone] ________________________________  Name: Samantha Quinn MRN: [id number]  Date: 04/01/2017  DOB: January 18, 1928  Simulation Verification Note    ICD-10-CM   1. Lung cancer metastatic to bone [facility]) C34.90    C79.51     Status: outpatient  NARRATIVE: The patient was brought to the treatment unit and placed in the planned treatment position. The clinical setup was verified. Then port films were obtained and uploaded to the radiation oncology medical record software.  The treatment beams were carefully compared against the planned radiation fields. The position location and shape of the radiation fields was reviewed. They targeted volume of tissue appears to be appropriately covered by the radiation beams. Organs at risk appear to be excluded as planned.  Based on my personal review, I approved the simulation verification. The patient's treatment will proceed as planned.  -----------------------------------  Blair Promise, PhD, MD

## 2017-04-02 ENCOUNTER — Ambulatory Visit
Admission: RE | Admit: 2017-04-02 | Discharge: 2017-04-02 | Disposition: A | Discharge status: Home / Self Care | Payer: Medicare Other | Source: Ambulatory Visit | Attending: Radiation Oncology | Admitting: Radiation Oncology

## 2017-04-02 ENCOUNTER — Encounter: Payer: Self-pay | Admitting: Hematology & Oncology

## 2017-04-02 DIAGNOSIS — C3431 Malignant neoplasm of lower lobe, right bronchus or lung: Secondary | ICD-10-CM | POA: Diagnosis not present

## 2017-04-02 DIAGNOSIS — Z510 Encounter for antineoplastic radiation therapy: Secondary | ICD-10-CM | POA: Diagnosis not present

## 2017-04-02 DIAGNOSIS — C3490 Malignant neoplasm of unspecified part of unspecified bronchus or lung: Secondary | ICD-10-CM | POA: Diagnosis not present

## 2017-04-02 DIAGNOSIS — C7951 Secondary malignant neoplasm of bone: Secondary | ICD-10-CM | POA: Diagnosis not present

## 2017-04-02 DIAGNOSIS — Z79899 Other long term (current) drug therapy: Secondary | ICD-10-CM | POA: Diagnosis not present

## 2017-04-05 ENCOUNTER — Ambulatory Visit: Payer: Medicare Other

## 2017-04-05 ENCOUNTER — Ambulatory Visit: Payer: Medicare Other | Admitting: Radiation Oncology

## 2017-04-06 ENCOUNTER — Ambulatory Visit
Admission: RE | Admit: 2017-04-06 | Discharge: 2017-04-06 | Disposition: A | Discharge status: Home / Self Care | Payer: Medicare Other | Source: Ambulatory Visit | Attending: Radiation Oncology | Admitting: Radiation Oncology

## 2017-04-06 DIAGNOSIS — C3431 Malignant neoplasm of lower lobe, right bronchus or lung: Secondary | ICD-10-CM | POA: Diagnosis not present

## 2017-04-06 DIAGNOSIS — C3490 Malignant neoplasm of unspecified part of unspecified bronchus or lung: Secondary | ICD-10-CM | POA: Diagnosis not present

## 2017-04-06 DIAGNOSIS — C7951 Secondary malignant neoplasm of bone: Secondary | ICD-10-CM | POA: Diagnosis not present

## 2017-04-06 DIAGNOSIS — Z510 Encounter for antineoplastic radiation therapy: Secondary | ICD-10-CM | POA: Diagnosis not present

## 2017-04-06 DIAGNOSIS — Z79899 Other long term (current) drug therapy: Secondary | ICD-10-CM | POA: Diagnosis not present

## 2017-04-07 ENCOUNTER — Ambulatory Visit
Admission: RE | Admit: 2017-04-07 | Discharge: 2017-04-07 | Disposition: A | Discharge status: Home / Self Care | Payer: Medicare Other | Source: Ambulatory Visit | Attending: Radiation Oncology | Admitting: Radiation Oncology

## 2017-04-07 ENCOUNTER — Other Ambulatory Visit: Payer: Self-pay

## 2017-04-07 ENCOUNTER — Ambulatory Visit: Payer: Medicare Other

## 2017-04-07 ENCOUNTER — Other Ambulatory Visit: Payer: Self-pay | Admitting: *Deleted

## 2017-04-07 ENCOUNTER — Other Ambulatory Visit (HOSPITAL_BASED_OUTPATIENT_CLINIC_OR_DEPARTMENT_OTHER): Payer: Medicare Other

## 2017-04-07 ENCOUNTER — Ambulatory Visit (HOSPITAL_BASED_OUTPATIENT_CLINIC_OR_DEPARTMENT_OTHER): Payer: Medicare Other | Admitting: Hematology & Oncology

## 2017-04-07 VITALS — BP 134/67 | HR 80 | Temp 98.2°F | Resp 20 | Wt 120.5 lb

## 2017-04-07 DIAGNOSIS — C3491 Malignant neoplasm of unspecified part of right bronchus or lung: Secondary | ICD-10-CM | POA: Diagnosis not present

## 2017-04-07 DIAGNOSIS — C7951 Secondary malignant neoplasm of bone: Principal | ICD-10-CM

## 2017-04-07 DIAGNOSIS — D500 Iron deficiency anemia secondary to blood loss (chronic): Secondary | ICD-10-CM

## 2017-04-07 DIAGNOSIS — Z79899 Other long term (current) drug therapy: Secondary | ICD-10-CM | POA: Diagnosis not present

## 2017-04-07 DIAGNOSIS — C3490 Malignant neoplasm of unspecified part of unspecified bronchus or lung: Secondary | ICD-10-CM

## 2017-04-07 DIAGNOSIS — Z510 Encounter for antineoplastic radiation therapy: Secondary | ICD-10-CM | POA: Diagnosis not present

## 2017-04-07 DIAGNOSIS — C3431 Malignant neoplasm of lower lobe, right bronchus or lung: Secondary | ICD-10-CM | POA: Diagnosis not present

## 2017-04-07 LAB — CMP (CANCER CENTER ONLY)
ALBUMIN: 3.5 g/dL (ref 3.3–5.5)
ALT(SGPT): 15 U/L (ref 10–47)
AST: 20 U/L (ref 11–38)
Alkaline Phosphatase: 60 U/L (ref 26–84)
BUN, Bld: 13 mg/dL (ref 7–22)
CALCIUM: 9.6 mg/dL (ref 8.0–10.3)
CHLORIDE: 103 meq/L (ref 98–108)
CO2: 29 meq/L (ref 18–33)
Creat: 0.8 mg/dl (ref 0.6–1.2)
Glucose, Bld: 104 mg/dL (ref 73–118)
Potassium: 4 mEq/L (ref 3.3–4.7)
Sodium: 144 mEq/L (ref 128–145)
TOTAL PROTEIN: 6.8 g/dL (ref 6.4–8.1)
Total Bilirubin: 0.7 mg/dl (ref 0.20–1.60)

## 2017-04-07 LAB — FERRITIN: Ferritin: 514 ng/ml — ABNORMAL HIGH (ref 9–269)

## 2017-04-07 LAB — CBC WITH DIFFERENTIAL (CANCER CENTER ONLY)
BASO#: 0 10*3/uL (ref 0.0–0.2)
BASO%: 0.3 % (ref 0.0–2.0)
EOS%: 1.5 % (ref 0.0–7.0)
Eosinophils Absolute: 0.1 10*3/uL (ref 0.0–0.5)
HEMATOCRIT: 36.1 % (ref 34.8–46.6)
HEMOGLOBIN: 11.5 g/dL — AB (ref 11.6–15.9)
LYMPH#: 0.7 10*3/uL — AB (ref 0.9–3.3)
LYMPH%: 10.8 % — ABNORMAL LOW (ref 14.0–48.0)
MCH: 28.4 pg (ref 26.0–34.0)
MCHC: 31.9 g/dL — AB (ref 32.0–36.0)
MCV: 89 fL (ref 81–101)
MONO#: 0.5 10*3/uL (ref 0.1–0.9)
MONO%: 7.7 % (ref 0.0–13.0)
NEUT%: 79.7 % (ref 39.6–80.0)
NEUTROS ABS: 5.3 10*3/uL (ref 1.5–6.5)
Platelets: 287 10*3/uL (ref 145–400)
RBC: 4.05 10*6/uL (ref 3.70–5.32)
RDW: 17.1 % — ABNORMAL HIGH (ref 11.1–15.7)
WBC: 6.6 10*3/uL (ref 3.9–10.0)

## 2017-04-07 LAB — IRON AND TIBC
%SAT: 20 % — AB (ref 21–57)
IRON: 61 ug/dL (ref 41–142)
TIBC: 299 ug/dL (ref 236–444)
UIBC: 238 ug/dL (ref 120–384)

## 2017-04-07 MED ORDER — HYDROCODONE-HOMATROPINE 5-1.5 MG/5ML PO SYRP: 5.0000 mL | ORAL_SOLUTION | Freq: Four times a day (QID) | ORAL | 0 refills | Status: DC | PRN

## 2017-04-07 NOTE — Progress Notes (Signed)
Dictation #1 [id number]  [id number]  Hematology and Oncology Follow Up Visit  YOANNA JURCZYK [id number] April 27, 1928 83 y.o. 04/07/2017   Principle Diagnosis:   Stage IV adenocarcinoma of the right lung-mutation negative  C6-7 left nerve root compression secondary to metastatic disease - PD-L1 (0%) /TMB of 9  Current Therapy:    Tecentriq 1200 mg IV every 3 weeks s/p cycle #8  Status post radiation/chemotherapy for recurrence-completed October 2017  Status post right upper lobectomy in March 2013 for stage IB disease  XRT to the cervical spine - started 04/02/2017  Zometa 4 mg IV q month     Interim History:  Ms. Fernandez is back for a visit.  She looks much better.  She does not have the cervical collar on any longer.   She did start her radiation therapy to the cervical spine.  She is doing well with this.  She did have a PET scan done.  This was done on December 4.  The PET scan showed an area of uptake in the cervical spine at C7.  She also had an area of activity in the right lung.  This area measures 2.1 x 1 cm.  There is some activity in left perihilar region, that is felt to be reactive.  She has some radiation changes.  She clearly qualifies as oligometastatic disease.  We did get back the genetic analysis from [facility].  Unfortunately, she does not have any actionable mutations.  She was intermediate status for TMB.  She is looking forward to Christmas.  Her daughter comes in with her.  It sounds like they will be together and have a very nice family Christmas.  She is eating better.  There is a cough.  This is dry.    She has had no problems with pain.  She really has healed up very nicely from her cervical spine surgery.    There is been no change in bowel or bladder habits.  We have given her IV iron in the past.  This has made her feel better.    Currently, her performance status is ECOG 1.   Medications:  Current Outpatient Medications:  .   amitriptyline (ELAVIL) 10 MG tablet, TAKE 1 TABLET BY MOUTH EVERYDAY AT BEDTIME, Disp: 90 tablet, Rfl: 0 .  bisoprolol-hydrochlorothiazide (ZIAC) 2.5-6.25 MG tablet, TAKE 1 TABLET DAILY, Disp: 90 tablet, Rfl: 3 .  Blood Glucose Monitoring Suppl (ONE TOUCH ULTRA 2) w/Device KIT, Use to check blood sugars twice a day Dx E11.9, Disp: 1 each, Rfl: 0 .  calcium carbonate (OS-CAL) 600 MG tablet, Take 600 mg by mouth daily., Disp: , Rfl:  .  cholecalciferol (VITAMIN D) 1000 units tablet, Take 1,000 Units by mouth daily., Disp: , Rfl:  .  gabapentin (NEURONTIN) 100 MG capsule, Take 100 mg by mouth 3 (three) times daily. , Disp: , Rfl:  .  glucose blood (ONE TOUCH ULTRA TEST) test strip, 1 each by Other route 2 (two) times daily. Use to check blood sugars twice a day, Disp: 100 each, Rfl: 3 .  Lancets (ONETOUCH ULTRASOFT) lancets, 1 each by Other route 2 (two) times daily. Use to help check blood sugars twice a day, Disp: 100 each, Rfl: 3 .  lovastatin (MEVACOR) 20 MG tablet, TAKE 1 TABLET BY MOUTH EVERY OTHER DAY, Disp: 90 tablet, Rfl: 1 .  metFORMIN (GLUCOPHAGE) 500 MG tablet, Take 1 tablet (500 mg total) by mouth 2 (two) times daily with a meal., Disp: 180 tablet, Rfl: 1 .  methimazole (TAPAZOLE) 5 MG tablet, Take 0.5 tablets (2.5 mg total) by mouth daily. (Patient taking differently: Take 5 mg by mouth every other day. ), Disp: 30 tablet, Rfl: 3 .  Multiple Vitamins-Minerals (VISION-VITE PRESERVE PO), Take 1 tablet by mouth daily. , Disp: , Rfl:  .  Probiotic Product (ALIGN PO), Take by mouth daily., Disp: , Rfl:  .  ramipril (ALTACE) 5 MG capsule, Take 5 mg by mouth every evening., Disp: , Rfl:  .  HYDROcodone-homatropine (HYCODAN) 5-1.5 MG/5ML syrup, Take 5 mLs by mouth every 6 (six) hours as needed for cough., Disp: 120 mL, Rfl: 0  Allergies:  Allergies  Allergen Reactions  . Tramadol Other (See Comments)    syncope  . Codeine Nausea And Vomiting  . Demerol Nausea Only    Past Medical History,  Surgical history, Social history, and Family History were reviewed and updated.  Review of Systems: As stated in the interim history  Physical Exam:  weight is 120 lb 8 oz (54.7 kg). Her temperature is 98.2 F (36.8 C). Her blood pressure is 134/67 and her pulse is 80. Her respiration is 20 and oxygen saturation is 99%.   Wt Readings from Last 3 Encounters:  04/07/17 120 lb 8 oz (54.7 kg)  03/31/17 119 lb (54 kg)  03/17/17 120 lb 4 oz (54.5 kg)     Physical Exam  Constitutional: She is oriented to person, place, and time.  HENT:  Head: Normocephalic and atraumatic.  Mouth/Throat: Oropharynx is clear and moist.  Eyes: EOM are normal. Pupils are equal, round, and reactive to light.  Neck: Normal range of motion.  Cardiovascular: Normal rate, regular rhythm and normal heart sounds.  Pulmonary/Chest: Effort normal and breath sounds normal.  Abdominal: Soft. Bowel sounds are normal.  Musculoskeletal: Normal range of motion. She exhibits no edema, tenderness or deformity.  She does have tenderness in the posterior left shoulder. She has moderate decreased range of motion of the left shoulder. She has partial abduction of the left arm.  Lymphadenopathy:    She has no cervical adenopathy.  Neurological: She is alert and oriented to person, place, and time.  Skin: Skin is warm and dry. No rash noted. No erythema.  Psychiatric: She has a normal mood and affect. Her behavior is normal. Judgment and thought content normal.  Vitals reviewed.    Lab Results  Component Value Date   WBC 6.6 04/07/2017   HGB 11.5 (L) 04/07/2017   HCT 36.1 04/07/2017   MCV 89 04/07/2017   PLT 287 04/07/2017     Chemistry      Component Value Date/Time   NA 144 04/07/2017 1215   NA 135 (L) 03/11/2017 1416   K 4.0 04/07/2017 1215   K 3.9 03/11/2017 1416   CL 103 04/07/2017 1215   CO2 29 04/07/2017 1215   CO2 23 03/11/2017 1416   BUN 13 04/07/2017 1215   BUN 16.1 03/11/2017 1416   CREATININE 0.8  04/07/2017 1215   CREATININE 0.9 03/11/2017 1416      Component Value Date/Time   CALCIUM 9.6 04/07/2017 1215   CALCIUM 9.6 03/11/2017 1416   ALKPHOS 60 04/07/2017 1215   ALKPHOS 79 03/11/2017 1416   AST 20 04/07/2017 1215   AST 11 03/11/2017 1416   ALT 15 04/07/2017 1215   ALT 11 03/11/2017 1416   BILITOT 0.70 04/07/2017 1215   BILITOT 0.26 03/11/2017 1416         Impression and Plan: Ms. Bitting is a  83 year-old white female. She has progressive non-small cell lung cancer. This is adenocarcinoma.  She is making a very nice come back.  She really looks good.  She does have active disease.  However, I really do not think that we need to intervene at this point.  I would like to see how the radiation does for her.  I would like to hope that the radiation will help eradicate the activity at C7.  I probably would not repeat a PET scan for another 8 weeks.  She will finish up radiation right after Christmas.  She does need Zometa.  I want to start this next week.  She will get this monthly.  I will see her back in 5 weeks.  By then, she will have finished radiation.  We will do a dose of Zometa when we see her back.  I spent about 30 minutes with she and her daughter.  I reviewed the PET scan.  I went over my recommendations.  They agree.  [name]   [name], MD 12/12/20185:35 PM

## 2017-04-08 ENCOUNTER — Ambulatory Visit
Admission: RE | Admit: 2017-04-08 | Discharge: 2017-04-08 | Disposition: A | Discharge status: Home / Self Care | Payer: Medicare Other | Source: Ambulatory Visit | Attending: Radiation Oncology | Admitting: Radiation Oncology

## 2017-04-08 ENCOUNTER — Ambulatory Visit: Payer: Medicare Other

## 2017-04-08 DIAGNOSIS — C3490 Malignant neoplasm of unspecified part of unspecified bronchus or lung: Secondary | ICD-10-CM | POA: Diagnosis not present

## 2017-04-08 DIAGNOSIS — Z510 Encounter for antineoplastic radiation therapy: Secondary | ICD-10-CM | POA: Diagnosis not present

## 2017-04-08 DIAGNOSIS — C7951 Secondary malignant neoplasm of bone: Secondary | ICD-10-CM | POA: Diagnosis not present

## 2017-04-08 DIAGNOSIS — C3431 Malignant neoplasm of lower lobe, right bronchus or lung: Secondary | ICD-10-CM | POA: Diagnosis not present

## 2017-04-08 DIAGNOSIS — Z79899 Other long term (current) drug therapy: Secondary | ICD-10-CM | POA: Diagnosis not present

## 2017-04-09 ENCOUNTER — Ambulatory Visit
Admission: RE | Admit: 2017-04-09 | Discharge: 2017-04-09 | Disposition: A | Discharge status: Home / Self Care | Payer: Medicare Other | Source: Ambulatory Visit | Attending: Radiation Oncology | Admitting: Radiation Oncology

## 2017-04-09 DIAGNOSIS — C3431 Malignant neoplasm of lower lobe, right bronchus or lung: Secondary | ICD-10-CM | POA: Diagnosis not present

## 2017-04-09 DIAGNOSIS — C3490 Malignant neoplasm of unspecified part of unspecified bronchus or lung: Secondary | ICD-10-CM | POA: Diagnosis not present

## 2017-04-09 DIAGNOSIS — C7951 Secondary malignant neoplasm of bone: Secondary | ICD-10-CM | POA: Diagnosis not present

## 2017-04-09 DIAGNOSIS — Z79899 Other long term (current) drug therapy: Secondary | ICD-10-CM | POA: Diagnosis not present

## 2017-04-09 DIAGNOSIS — Z510 Encounter for antineoplastic radiation therapy: Secondary | ICD-10-CM | POA: Diagnosis not present

## 2017-04-12 ENCOUNTER — Ambulatory Visit
Admission: RE | Admit: 2017-04-12 | Discharge: 2017-04-12 | Disposition: A | Discharge status: Home / Self Care | Payer: Medicare Other | Source: Ambulatory Visit | Attending: Radiation Oncology | Admitting: Radiation Oncology

## 2017-04-12 DIAGNOSIS — C3431 Malignant neoplasm of lower lobe, right bronchus or lung: Secondary | ICD-10-CM | POA: Diagnosis not present

## 2017-04-12 DIAGNOSIS — Z510 Encounter for antineoplastic radiation therapy: Secondary | ICD-10-CM | POA: Diagnosis not present

## 2017-04-12 DIAGNOSIS — C3490 Malignant neoplasm of unspecified part of unspecified bronchus or lung: Secondary | ICD-10-CM | POA: Diagnosis not present

## 2017-04-12 DIAGNOSIS — Z79899 Other long term (current) drug therapy: Secondary | ICD-10-CM | POA: Diagnosis not present

## 2017-04-12 DIAGNOSIS — C7951 Secondary malignant neoplasm of bone: Secondary | ICD-10-CM | POA: Diagnosis not present

## 2017-04-13 ENCOUNTER — Ambulatory Visit
Admission: RE | Admit: 2017-04-13 | Discharge: 2017-04-13 | Disposition: A | Discharge status: Home / Self Care | Payer: Medicare Other | Source: Ambulatory Visit | Attending: Radiation Oncology | Admitting: Radiation Oncology

## 2017-04-13 DIAGNOSIS — C3431 Malignant neoplasm of lower lobe, right bronchus or lung: Secondary | ICD-10-CM | POA: Diagnosis not present

## 2017-04-13 DIAGNOSIS — Z510 Encounter for antineoplastic radiation therapy: Secondary | ICD-10-CM | POA: Diagnosis not present

## 2017-04-13 DIAGNOSIS — C3490 Malignant neoplasm of unspecified part of unspecified bronchus or lung: Secondary | ICD-10-CM | POA: Diagnosis not present

## 2017-04-13 DIAGNOSIS — C7951 Secondary malignant neoplasm of bone: Principal | ICD-10-CM

## 2017-04-13 DIAGNOSIS — Z79899 Other long term (current) drug therapy: Secondary | ICD-10-CM | POA: Diagnosis not present

## 2017-04-13 MED ORDER — SONAFINE EX EMUL
1.0000 "application " | Freq: Two times a day (BID) | CUTANEOUS | Status: DC
  Administered 2017-04-13: 1 via TOPICAL

## 2017-04-13 NOTE — Addendum Note (Signed)
Encounter addended by: Sherrlyn Hock, LPN on: 92/02/9416 4:08 PM  Actions taken: MAR administration accepted

## 2017-04-13 NOTE — Progress Notes (Signed)
Pt here for patient teaching.  Pt given Radiation and You booklet, skin care instructions and Sonafine.  Reviewed areas of pertinence such as fatigue, nausea and vomiting, skin changes and throat changes . Pt able to give teach back of drink plenty of water,apply Sonafine bid and avoid applying anything to skin within 4 hours of treatment. Pt verbalizes understanding of information given and will contact nursing with any questions or concerns.     [email or web address]

## 2017-04-14 ENCOUNTER — Ambulatory Visit
Admission: RE | Admit: 2017-04-14 | Discharge: 2017-04-14 | Disposition: A | Discharge status: Home / Self Care | Payer: Medicare Other | Source: Ambulatory Visit | Attending: Radiation Oncology | Admitting: Radiation Oncology

## 2017-04-14 ENCOUNTER — Other Ambulatory Visit: Payer: Self-pay

## 2017-04-14 ENCOUNTER — Ambulatory Visit (HOSPITAL_BASED_OUTPATIENT_CLINIC_OR_DEPARTMENT_OTHER): Payer: Medicare Other

## 2017-04-14 VITALS — BP 134/73 | HR 79 | Temp 98.1°F | Resp 20

## 2017-04-14 DIAGNOSIS — C3490 Malignant neoplasm of unspecified part of unspecified bronchus or lung: Secondary | ICD-10-CM | POA: Diagnosis not present

## 2017-04-14 DIAGNOSIS — Z510 Encounter for antineoplastic radiation therapy: Secondary | ICD-10-CM | POA: Diagnosis not present

## 2017-04-14 DIAGNOSIS — Z79899 Other long term (current) drug therapy: Secondary | ICD-10-CM | POA: Diagnosis not present

## 2017-04-14 DIAGNOSIS — C3431 Malignant neoplasm of lower lobe, right bronchus or lung: Secondary | ICD-10-CM

## 2017-04-14 DIAGNOSIS — C7951 Secondary malignant neoplasm of bone: Secondary | ICD-10-CM | POA: Diagnosis present

## 2017-04-14 DIAGNOSIS — D508 Other iron deficiency anemias: Secondary | ICD-10-CM

## 2017-04-14 MED ORDER — ZOLEDRONIC ACID 4 MG/5ML IV CONC
3.0000 mg | Freq: Once | INTRAVENOUS | Status: AC
  Administered 2017-04-14: 3 mg via INTRAVENOUS
  Filled 2017-04-14: qty 3.75

## 2017-04-14 MED ORDER — SODIUM CHLORIDE 0.9 % IV SOLN
Freq: Once | INTRAVENOUS | Status: AC
  Administered 2017-04-14: 09:00:00 via INTRAVENOUS

## 2017-04-14 NOTE — Patient Instructions (Signed)

## 2017-04-15 ENCOUNTER — Ambulatory Visit
Admission: RE | Admit: 2017-04-15 | Discharge: 2017-04-15 | Disposition: A | Discharge status: Home / Self Care | Payer: Medicare Other | Source: Ambulatory Visit | Attending: Radiation Oncology | Admitting: Radiation Oncology

## 2017-04-15 DIAGNOSIS — C3431 Malignant neoplasm of lower lobe, right bronchus or lung: Secondary | ICD-10-CM | POA: Diagnosis not present

## 2017-04-15 DIAGNOSIS — Z79899 Other long term (current) drug therapy: Secondary | ICD-10-CM | POA: Diagnosis not present

## 2017-04-15 DIAGNOSIS — C3490 Malignant neoplasm of unspecified part of unspecified bronchus or lung: Secondary | ICD-10-CM | POA: Diagnosis not present

## 2017-04-15 DIAGNOSIS — Z510 Encounter for antineoplastic radiation therapy: Secondary | ICD-10-CM | POA: Diagnosis not present

## 2017-04-15 DIAGNOSIS — C7951 Secondary malignant neoplasm of bone: Secondary | ICD-10-CM | POA: Diagnosis not present

## 2017-04-16 ENCOUNTER — Ambulatory Visit: Payer: Medicare Other

## 2017-04-19 ENCOUNTER — Ambulatory Visit: Payer: Medicare Other

## 2017-04-21 ENCOUNTER — Other Ambulatory Visit: Payer: Self-pay | Admitting: Radiation Oncology

## 2017-04-21 ENCOUNTER — Ambulatory Visit: Payer: Medicare Other

## 2017-04-21 ENCOUNTER — Ambulatory Visit
Admission: RE | Admit: 2017-04-21 | Discharge: 2017-04-21 | Disposition: A | Discharge status: Home / Self Care | Payer: Medicare Other | Source: Ambulatory Visit | Attending: Radiation Oncology | Admitting: Radiation Oncology

## 2017-04-21 ENCOUNTER — Other Ambulatory Visit: Payer: Self-pay | Admitting: Internal Medicine

## 2017-04-21 DIAGNOSIS — E119 Type 2 diabetes mellitus without complications: Secondary | ICD-10-CM

## 2017-04-21 DIAGNOSIS — C3490 Malignant neoplasm of unspecified part of unspecified bronchus or lung: Secondary | ICD-10-CM | POA: Diagnosis not present

## 2017-04-21 DIAGNOSIS — G893 Neoplasm related pain (acute) (chronic): Secondary | ICD-10-CM

## 2017-04-21 DIAGNOSIS — Z79899 Other long term (current) drug therapy: Secondary | ICD-10-CM | POA: Diagnosis not present

## 2017-04-21 DIAGNOSIS — Z510 Encounter for antineoplastic radiation therapy: Secondary | ICD-10-CM | POA: Diagnosis not present

## 2017-04-21 DIAGNOSIS — C7951 Secondary malignant neoplasm of bone: Secondary | ICD-10-CM | POA: Diagnosis not present

## 2017-04-21 DIAGNOSIS — C3431 Malignant neoplasm of lower lobe, right bronchus or lung: Secondary | ICD-10-CM | POA: Diagnosis not present

## 2017-04-21 MED ORDER — LIDOCAINE VISCOUS 2 % MT SOLN: OROMUCOSAL | 2 refills | Status: DC

## 2017-04-22 ENCOUNTER — Ambulatory Visit
Admission: RE | Admit: 2017-04-22 | Discharge: 2017-04-22 | Disposition: A | Discharge status: Home / Self Care | Payer: Medicare Other | Source: Ambulatory Visit | Attending: Radiation Oncology | Admitting: Radiation Oncology

## 2017-04-22 ENCOUNTER — Ambulatory Visit: Payer: Medicare Other

## 2017-04-22 DIAGNOSIS — C3431 Malignant neoplasm of lower lobe, right bronchus or lung: Secondary | ICD-10-CM | POA: Diagnosis not present

## 2017-04-22 DIAGNOSIS — C3490 Malignant neoplasm of unspecified part of unspecified bronchus or lung: Secondary | ICD-10-CM | POA: Diagnosis not present

## 2017-04-22 DIAGNOSIS — Z510 Encounter for antineoplastic radiation therapy: Secondary | ICD-10-CM | POA: Diagnosis not present

## 2017-04-22 DIAGNOSIS — Z79899 Other long term (current) drug therapy: Secondary | ICD-10-CM | POA: Diagnosis not present

## 2017-04-22 DIAGNOSIS — C7951 Secondary malignant neoplasm of bone: Secondary | ICD-10-CM | POA: Diagnosis not present

## 2017-04-23 ENCOUNTER — Ambulatory Visit: Payer: Medicare Other

## 2017-04-23 ENCOUNTER — Ambulatory Visit
Admission: RE | Admit: 2017-04-23 | Discharge: 2017-04-23 | Disposition: A | Discharge status: Home / Self Care | Payer: Medicare Other | Source: Ambulatory Visit | Attending: Radiation Oncology | Admitting: Radiation Oncology

## 2017-04-23 DIAGNOSIS — Z510 Encounter for antineoplastic radiation therapy: Secondary | ICD-10-CM | POA: Diagnosis not present

## 2017-04-23 DIAGNOSIS — C7951 Secondary malignant neoplasm of bone: Secondary | ICD-10-CM | POA: Diagnosis not present

## 2017-04-23 DIAGNOSIS — C3431 Malignant neoplasm of lower lobe, right bronchus or lung: Secondary | ICD-10-CM | POA: Diagnosis not present

## 2017-04-23 DIAGNOSIS — Z79899 Other long term (current) drug therapy: Secondary | ICD-10-CM | POA: Diagnosis not present

## 2017-04-23 DIAGNOSIS — C3490 Malignant neoplasm of unspecified part of unspecified bronchus or lung: Secondary | ICD-10-CM | POA: Diagnosis not present

## 2017-04-26 ENCOUNTER — Ambulatory Visit
Admission: RE | Admit: 2017-04-26 | Discharge: 2017-04-26 | Disposition: A | Discharge status: Home / Self Care | Payer: Medicare Other | Source: Ambulatory Visit | Attending: Radiation Oncology | Admitting: Radiation Oncology

## 2017-04-26 DIAGNOSIS — Z79899 Other long term (current) drug therapy: Secondary | ICD-10-CM | POA: Diagnosis not present

## 2017-04-26 DIAGNOSIS — C7951 Secondary malignant neoplasm of bone: Secondary | ICD-10-CM | POA: Diagnosis not present

## 2017-04-26 DIAGNOSIS — C3431 Malignant neoplasm of lower lobe, right bronchus or lung: Secondary | ICD-10-CM | POA: Diagnosis not present

## 2017-04-26 DIAGNOSIS — Z510 Encounter for antineoplastic radiation therapy: Secondary | ICD-10-CM | POA: Diagnosis not present

## 2017-04-26 DIAGNOSIS — C3490 Malignant neoplasm of unspecified part of unspecified bronchus or lung: Secondary | ICD-10-CM | POA: Diagnosis not present

## 2017-04-28 ENCOUNTER — Ambulatory Visit: Payer: Medicare Other | Admitting: Internal Medicine

## 2017-04-29 ENCOUNTER — Encounter: Payer: Self-pay | Admitting: Internal Medicine

## 2017-04-29 ENCOUNTER — Encounter: Payer: Self-pay | Admitting: Radiation Oncology

## 2017-04-29 ENCOUNTER — Ambulatory Visit (INDEPENDENT_AMBULATORY_CARE_PROVIDER_SITE_OTHER): Payer: Medicare Other | Admitting: Internal Medicine

## 2017-04-29 VITALS — BP 110/62 | HR 86 | Ht 61.0 in | Wt 118.6 lb

## 2017-04-29 DIAGNOSIS — E0520 Thyrotoxicosis with toxic multinodular goiter without thyrotoxic crisis or storm: Secondary | ICD-10-CM | POA: Diagnosis not present

## 2017-04-29 LAB — TSH: TSH: 0.4 u[IU]/mL (ref 0.35–4.50)

## 2017-04-29 LAB — T4, FREE: Free T4: 0.69 ng/dL (ref 0.60–1.60)

## 2017-04-29 LAB — T3, FREE: T3, Free: 3.3 pg/mL (ref 2.3–4.2)

## 2017-04-29 MED ORDER — METHIMAZOLE 5 MG PO TABS: ORAL_TABLET | ORAL | 2 refills | Status: DC

## 2017-04-29 NOTE — Progress Notes (Signed)
  Radiation Oncology         [phone] ________________________________  Name: NECOLA BLUESTEIN MRN: [id number]  Date: 04/29/2017  DOB: 06-25-1927  End of Treatment Note  Diagnosis:   Recurrent non-small cell lung cancer     Indication for treatment:  Palliative, post-op       Radiation treatment dates:   04/01/17-04/26/17  Site/dose:   Cervical spine / 35 Gy  delivered in 14 fractions  Beams/energy:   3D / 10X, 6X  Narrative: The patient tolerated radiation treatment relatively well. Initially she was asymptomatic. Throughout the course of treatment, the patient developed fatigue, tightness and aching in her neck, dysphagia, dry cough and mild SOB with exertion. She also reports some pruritis, erythema and peeling of her skin at the site of radiation therapy. Otherwise, she is without acute complaints and denied nausea, vomiting, diarrhea.  Plan: The patient has completed radiation treatment. The patient will return to radiation oncology clinic for routine followup in one month. I advised them to call or return sooner if they have any questions or concerns related to their recovery or treatment.  -----------------------------------  Blair Promise, PhD, MD  This document serves as a record of services personally performed by Gery Pray, MD. It was created on his behalf by Marlowe Kays, a trained medical scribe. The creation of this record is based on the scribe's personal observations and the provider's statements to them. This document has been checked and approved by the attending provider.

## 2017-04-29 NOTE — Progress Notes (Signed)
Patient ID: Samantha Quinn, female   DOB: Oct 27, 1927, 84 y.o.   MRN: [id number]   HPI  Samantha Quinn is a 84 y.o.-year-old female, returning for follow-up for thyrotoxic goiter. Last visit was 3 mo ago.  Patient has a history of lung cancer and has had RxTx and Immunotx. Last PET scan 03/31/2017. She was found to have a vertebral metastasis in the posterior cervical region -had surgery and vertebral fusion and then had another 14 Rxtx tx's.  Reviewed hx: She also has a history of thyroiditis with subsequent hypothyroidism and was on levothyroxine for many years. However, the TSH study to be suppressed and her levothyroxine dose continued to be decreased until this was stopped.  We started methimazole low-dose until she finished her chemotherapy and had another restaging scan. She is now on a very low dose: 5 mg every other day. She tolerates this well.  I reviewed pt's thyroid tests: Lab Results  Component Value Date   TSH 0.60 01/18/2017   TSH 3.65 11/18/2016   TSH 0.56 09/16/2016   TSH 0.12 (L) 08/12/2016   TSH 0.02 (L) 06/17/2016   TSH 0.02 (L) 05/05/2016   TSH <0.080 (L) 01/27/2016   TSH 0.01 (L) 11/12/2015   TSH 0.01 (L) 07/10/2015   TSH <0.008 (L) 03/08/2015   FREET4 0.81 01/18/2017   FREET4 0.53 (L) 11/18/2016   FREET4 0.55 (L) 09/16/2016   FREET4 0.46 (L) 08/12/2016   FREET4 0.81 06/17/2016   FREET4 1.09 05/05/2016   FREET4 1.32 11/12/2015   FREET4 1.47 07/10/2015   FREET4 1.60 03/08/2015   FREET4 1.42 08/18/2013    Graves Ab's were not high: Lab Results  Component Value Date   TSI <89 06/17/2016   She has a history of thyroid nodules, with a thyroid ultrasound in 08/29/2013 and the subsequent biopsy of the dominant right mid nodule measuring 3.1 x 1.9 x 2.1 cm. This was benign.  Thyroid uptake and scan 08/28/2016: uptake was elevated, at 40%, and the scan was positive for multinodular goiter.  Pt denies: - feeling nodules in neck - hoarseness - choking - SOB  with lying down  She has: - + dysphagia and a dry cough after Rx Tx.  Pt does have a FH of thyroid ds.: mother - thyroid removed; sister - ?. No h/o radiation tx to head or neck. Had RxTx  - lung, cervical spine. No FH of thyroid cancer. + h/o radiation tx to head or neck - recently.  No seaweed or kelp. No recent contrast studies. No herbal supplements. No Biotin use. No recent steroids use.   ROS: Constitutional: no weight gain/no weight loss, + fatigue, + subjective hyperthermia, no subjective hypothermia Eyes: no blurry vision, no xerophthalmia ENT: no sore throat, + see HPI Cardiovascular: no CP/no SOB/no palpitations/no leg swelling Respiratory: + cough/no SOB/no wheezing Gastrointestinal: no N/no V/no D/no C/no acid reflux Musculoskeletal: no muscle aches/no joint aches Skin: no rashes, no hair loss Neurological: no tremors/no numbness/no tingling/no dizziness  I reviewed pt's medications, allergies, PMH, social hx, family hx, and changes were documented in the history of present illness. Otherwise, unchanged from my initial visit note.  Past Medical History:  Diagnosis Date  . Anxiety   . Back pain    bulding disc  . Bronchitis    hx of-many,many years   . Diabetes mellitus   . Diabetes mellitus    type 2 and takes Metformin bid  . Dyslipidemia   . Embolism - blood clot  hx of in left lower leg and was on Coumadin for about 59month;this was about 8-150yrago  . Family history of adverse reaction to anesthesia    Daughter- Nausea  . History of colonic polyps   . History of radiation therapy 01/06/16-02/19/16   The primary tumor and involved mediastinal adenopathy were treated to 66 Gy in 33 fractions of 2 Gy.  . Marland KitchenOH (hard of hearing)   . Hx of migraines    as a teenager  . Hypercholesterolemia   . Hyperlipidemia    takes Lovastatin every other day  . Hypertension    takes Ziac daily and Ramipril as well  . Hyperthyroidism   . Hypothyroidism    takes  Synthroid every other day  . Insomnia    takes Elavil nightly  . Joint pain   . Lung mass   . Myalgia   . Osteoarthritis   . Osteoporosis   . Pneumothorax of right lung after biopsy 07/09/11   HAD CHEST TUBE PLACEMENT  . PONV (postoperative nausea and vomiting)   . Primary adenocarcinoma of lower lobe of right lung (HCPickens2/27/2013   pT2a, pN0 M0 Stage 1 Well differenced Adenocarcinoma 3.5 cm resected 07/17/2011  . Primary adenocarcinoma of lung (HCAmo2/27/2013  . Shortness of breath dyspnea    With exertion  . Skin cancer    legs have dark spots on them  . Upper respiratory infection 04/2011  . Urinary incontinence    takes Detrol daily prn   Past Surgical History:  Procedure Laterality Date  . ABDOMINAL HYSTERECTOMY    . ANTERIOR CERVICAL DECOMP/DISCECTOMY FUSION N/A 02/17/2017   Procedure: ANTERIOR CERVICAL DECOMPRESSION FUSION, CERVICAL 6-7 WITH INSTRUMENTATION AND ALLOGRAFT;  Surgeon: DuPhylliss BobMD;  [facility]: [facility];  Laterality: N/A;  ANTERIOR CERVICAL DECOMPRESSION FUSION, CERVICAL 6-7 WITH INSTRUMENTATION AND ALLOGRAFT; REQUEST 2 HOURS AND FLIP   . APPENDECTOMY     as a child  . CHEST TUBE REMOVAL  07/13/11   RIGHT  CHEST TUBE  . CHOLECYSTECTOMY    . COLONOSCOPY    . COLONOSCOPY    . DILATION AND CURETTAGE OF UTERUS     x 2  . EYE SURGERY Bilateral    Cataract  . HEMORRHOID SURGERY    . LUNG BIOPSY  07/09/11   RIGHT UPER LOBE LUNG MASS   . LUNG LOBECTOMY Right 06/2011  . POSTERIOR CERVICAL FUSION/FORAMINOTOMY N/A 02/18/2017   Procedure: CERVICAL Five-Six, CERVICAL Six-Seven, CERVICAL Seven-THORACIC One, THORACIC One-Two POSTERIOR SPINAL FUSION WITH INSTRUMENTATION AND ALLOGRAFT;  Surgeon: DuPhylliss BobMD;  [facility];  Laterality: N/A;  . RIGHT CHEST TUBE PLACEMENT  07/10/11   S/P LUNG BX  . ROTATOR CUFF REPAIR     bilateral  . TOE SURGERY  2013   right small toe  . TONSILLECTOMY     as a child  . VIDEO  BRONCHOSCOPY  07/17/2011   Procedure: VIDEO BRONCHOSCOPY;  Surgeon: EdGrace IsaacMD;  [facility];  Laterality: N/A;  . VIDEO BRONCHOSCOPY WITH ENDOBRONCHIAL ULTRASOUND N/A 12/10/2015   Procedure: VIDEO BRONCHOSCOPY WITH ENDOBRONCHIAL ULTRASOUND with biopsies.;  Surgeon: EdGrace IsaacMD;  [facility]: Thor[facility];  Laterality: N/A;   Social History   Social History  . Marital status: Widowed    Spouse name: N/A  . Number of children: 3   Occupational History  . homemaker   Social History Main Topics  . Smoking status:  Never Smoker  . Smokeless tobacco: Never Used  . Alcohol use No  . Drug use: No   Current Outpatient Medications on File Prior to Visit  Medication Sig Dispense Refill  . amitriptyline (ELAVIL) 10 MG tablet TAKE 1 TABLET BY MOUTH EVERYDAY AT BEDTIME 90 tablet 0  . bisoprolol-hydrochlorothiazide (ZIAC) 2.5-6.25 MG tablet TAKE 1 TABLET DAILY 90 tablet 3  . Blood Glucose Monitoring Suppl (ONE TOUCH ULTRA 2) w/Device KIT Use to check blood sugars twice a day Dx E11.9 1 each 0  . calcium carbonate (OS-CAL) 600 MG tablet Take 600 mg by mouth daily.    . cholecalciferol (VITAMIN D) 1000 units tablet Take 1,000 Units by mouth daily.    [name] gabapentin (NEURONTIN) 100 MG capsule Take 100 mg by mouth 3 (three) times daily.     [name] glucose blood (ONE TOUCH ULTRA TEST) test strip 1 each by Other route 2 (two) times daily. Use to check blood sugars twice a day 100 each 3  . HYDROcodone-homatropine (HYCODAN) 5-1.5 MG/5ML syrup Take 5 mLs by mouth every 6 (six) hours as needed for cough. 120 mL 0  . Lancets (ONETOUCH ULTRASOFT) lancets 1 each by Other route 2 (two) times daily. Use to help check blood sugars twice a day 100 each 3  . lidocaine (XYLOCAINE) 2 % solution Patient: Mix 1part 2% viscous lidocaine, 1part H20. Swallow 75m of diluted mixture, 377m before meals and at bedtime, up to QID 100 mL 2  . lovastatin (MEVACOR) 20 MG tablet  TAKE 1 TABLET BY MOUTH EVERY OTHER DAY 90 tablet 1  . metFORMIN (GLUCOPHAGE) 500 MG tablet Take 1 tablet (500 mg total) by mouth 2 (two) times daily with a meal. 180 tablet 1  . metFORMIN (GLUCOPHAGE) 500 MG tablet TAKE 1 TABLET BY MOUTH TWICE A DAY 180 tablet 1  . methimazole (TAPAZOLE) 5 MG tablet Take 0.5 tablets (2.5 mg total) by mouth daily. (Patient taking differently: Take 5 mg by mouth every other day. ) 30 tablet 3  . Multiple Vitamins-Minerals (VISION-VITE PRESERVE PO) Take 1 tablet by mouth daily.     . Probiotic Product (ALIGN PO) Take by mouth daily.    . ramipril (ALTACE) 5 MG capsule Take 5 mg by mouth every evening.     No current facility-administered medications on file prior to visit.    Allergies  Allergen Reactions  . Tramadol Other (See Comments)    syncope  . Codeine Nausea And Vomiting  . Demerol Nausea Only   Family History  Problem Relation Age of Onset  . Hypertension Mother   . Hypertension Father   . Arthritis Sister   . Arthritis Sister   . Diabetes Brother   . Hypertension Sister   . Anesthesia problems Neg Hx   . Hypotension Neg Hx   . Malignant hyperthermia Neg Hx   . Pseudochol deficiency Neg Hx   . Heart attack Neg Hx    PE: BP 110/62   Pulse 86   Ht '5\' 1"'  (1.549 m)   Wt 118 lb 9.6 oz (53.8 kg)   SpO2 99%   BMI 22.41 kg/m  Wt Readings from Last 3 Encounters:  04/29/17 118 lb 9.6 oz (53.8 kg)  04/07/17 120 lb 8 oz (54.7 kg)  03/31/17 119 lb (54 kg)   Constitutional: normal weight, in NAD Eyes: PERRLA, EOMI, no exophthalmos ENT: moist mucous membranes, no thyromegaly, but + R large mobile  thyroid nodule; no cervical lymphadenopathy Cardiovascular: RRR, No MRG Respiratory:  CTA B Gastrointestinal: abdomen soft, NT, ND, BS+ Musculoskeletal: no deformities, strength intact in all 4 Skin: moist, warm, no rashes Neurological: no tremor with outstretched hands, DTR normal in all 4   ASSESSMENT: 1. Thyrotoxicosis - h/o  hypothyroidism  2. TMNG  PLAN:  1. And 2.  Patient with a long history of low TSH, previously on levothyroxine, now on methimazole for toxic multinodular goiter.  Her TSI antibodies were not elevated to suggest Graves' disease and she also had a thyroid uptake and scan indicating the above diagnosis.  At last visit, we discussed about the possibility of radioactive iodine treatment, however, she wanted to wait until she was finishing her cancer treatments before proceeding with the RAI ablation.  As of now, she has completed immunotherapy and 2 rounds of radiotherapy, but she is doing very well on a very low dose of 5 mg every other day of methimazole, and due to age and comorbidities, I do not feel that she absolutely needs the RAI treatment.  She agrees to continue on 5 mg every other day of methimazole for now but will repeat the test today and then again in 6 months to make sure that she is not becoming thyrotoxic. She does have hot flushes but these started after coming off Premarin. I will then see her again in 1 year. - continue beta-blocker: Bisoprolol - check TFTs today: Orders Placed This Encounter  Procedures  . TSH  . T4, free  . T3, free  - RTC in 1 year, but in 6 months for labs  Needs refills MMI.  - time spent with the patient: 20 min, of which >50% was spent in obtaining information about her symptoms, reviewing her previous cancer history, labs, evaluations, and treatments, counseling her about her condition (please see the discussed topics above), and developing a plan to further investigate it; she had a number of questions which I addressed.  Component     Latest Ref Rng & Units 04/29/2017  TSH     0.35 - 4.50 uIU/mL 0.40  T4,Free(Direct)     0.60 - 1.60 ng/dL 0.69  Triiodothyronine,Free,Serum     2.3 - 4.2 pg/mL 3.3   TFTs normal, but TSH at the [facility]. Will try to increase MMI to 2.5 alternating with 5 mg every other day and recheck her TFTs in 2 months.  [place]  [facility]

## 2017-04-29 NOTE — Patient Instructions (Signed)
Please stop at the lab.  Continue Methimazole 5 mg every other day.  Come back for labs in 6 months, but for a visit in 1 year.

## 2017-05-10 ENCOUNTER — Other Ambulatory Visit: Payer: Self-pay | Admitting: Internal Medicine

## 2017-05-10 DIAGNOSIS — Z9889 Other specified postprocedural states: Secondary | ICD-10-CM | POA: Diagnosis not present

## 2017-05-10 DIAGNOSIS — M542 Cervicalgia: Secondary | ICD-10-CM | POA: Diagnosis not present

## 2017-05-12 ENCOUNTER — Encounter: Payer: Self-pay | Admitting: Hematology & Oncology

## 2017-05-12 ENCOUNTER — Other Ambulatory Visit: Payer: Self-pay

## 2017-05-12 ENCOUNTER — Inpatient Hospital Stay: Payer: Medicare Other | Attending: Hematology & Oncology

## 2017-05-12 ENCOUNTER — Inpatient Hospital Stay: Payer: Medicare Other

## 2017-05-12 ENCOUNTER — Inpatient Hospital Stay (HOSPITAL_BASED_OUTPATIENT_CLINIC_OR_DEPARTMENT_OTHER): Payer: Medicare Other | Admitting: Hematology & Oncology

## 2017-05-12 VITALS — BP 150/59 | HR 73 | Temp 98.1°F | Resp 19 | Wt 120.8 lb

## 2017-05-12 DIAGNOSIS — Z7984 Long term (current) use of oral hypoglycemic drugs: Secondary | ICD-10-CM

## 2017-05-12 DIAGNOSIS — Z923 Personal history of irradiation: Secondary | ICD-10-CM | POA: Diagnosis not present

## 2017-05-12 DIAGNOSIS — Z9221 Personal history of antineoplastic chemotherapy: Secondary | ICD-10-CM | POA: Insufficient documentation

## 2017-05-12 DIAGNOSIS — C3490 Malignant neoplasm of unspecified part of unspecified bronchus or lung: Secondary | ICD-10-CM

## 2017-05-12 DIAGNOSIS — Z79899 Other long term (current) drug therapy: Secondary | ICD-10-CM

## 2017-05-12 DIAGNOSIS — C3431 Malignant neoplasm of lower lobe, right bronchus or lung: Secondary | ICD-10-CM

## 2017-05-12 DIAGNOSIS — C7951 Secondary malignant neoplasm of bone: Secondary | ICD-10-CM

## 2017-05-12 DIAGNOSIS — C3491 Malignant neoplasm of unspecified part of right bronchus or lung: Secondary | ICD-10-CM | POA: Insufficient documentation

## 2017-05-12 DIAGNOSIS — D508 Other iron deficiency anemias: Secondary | ICD-10-CM

## 2017-05-12 LAB — CBC WITH DIFFERENTIAL (CANCER CENTER ONLY)
BASOS ABS: 0 10*3/uL (ref 0.0–0.1)
Basophils Relative: 0 %
Eosinophils Absolute: 0.1 10*3/uL (ref 0.0–0.5)
Eosinophils Relative: 1 %
HEMATOCRIT: 38 % (ref 34.8–46.6)
HEMOGLOBIN: 12.4 g/dL (ref 11.6–15.9)
LYMPHS PCT: 9 %
Lymphs Abs: 0.5 10*3/uL — ABNORMAL LOW (ref 0.9–3.3)
MCH: 29.2 pg (ref 26.0–34.0)
MCHC: 32.6 g/dL (ref 32.0–36.0)
MCV: 89.4 fL (ref 81.0–101.0)
MONO ABS: 0.4 10*3/uL (ref 0.1–0.9)
Monocytes Relative: 7 %
NEUTROS ABS: 5.1 10*3/uL (ref 1.5–6.5)
Neutrophils Relative %: 83 %
Platelet Count: 238 10*3/uL (ref 145–400)
RBC: 4.25 MIL/uL (ref 3.70–5.32)
RDW: 15 % (ref 11.1–15.7)
WBC Count: 6.2 10*3/uL (ref 3.9–10.3)

## 2017-05-12 LAB — CMP (CANCER CENTER ONLY)
ALT: 20 U/L (ref 0–55)
ANION GAP: 12 (ref 5–15)
AST: 20 U/L (ref 5–34)
Albumin: 3.3 g/dL — ABNORMAL LOW (ref 3.5–5.0)
Alkaline Phosphatase: 59 U/L (ref 26–84)
BUN: 15 mg/dL (ref 7–22)
CHLORIDE: 104 mmol/L (ref 98–108)
CO2: 28 mmol/L (ref 18–33)
Calcium: 9.3 mg/dL (ref 8.0–10.3)
Creatinine: 0.6 mg/dL (ref 0.60–1.10)
GLUCOSE: 140 mg/dL — AB (ref 73–118)
Potassium: 4 mmol/L (ref 3.5–5.1)
Sodium: 144 mmol/L (ref 128–145)
Total Bilirubin: 0.5 mg/dL (ref 0.2–1.2)
Total Protein: 6.5 g/dL (ref 6.4–8.1)

## 2017-05-12 MED ORDER — ZOLEDRONIC ACID 4 MG/5ML IV CONC
3.0000 mg | Freq: Once | INTRAVENOUS | Status: AC
  Administered 2017-05-12: 3 mg via INTRAVENOUS
  Filled 2017-05-12: qty 3.75

## 2017-05-12 NOTE — Progress Notes (Signed)
Dictation #1 [id number]  [id number]  Hematology and Oncology Follow Up Visit  Samantha Quinn [id number] 04-11-1928 84 y.o. 05/12/2017   Principle Diagnosis:   Stage IV adenocarcinoma of the right lung-mutation negative  C6-7 left nerve root compression secondary to metastatic disease - PD-L1 (0%) /TMB of 9  Current Therapy:    Tecentriq 1200 mg IV every 3 weeks s/p cycle #8  Status post radiation/chemotherapy for recurrence-completed October 2017  Status post right upper lobectomy in March 2013 for stage IB disease  XRT to the cervical spine - started 04/02/2017  Zometa 4 mg IV q month     Interim History:  Samantha Quinn is back for a visit.  She looks great.  She enjoyed the Christmas holidays.  She really did not have problems over the Christmas holidays.  Her neck is feeling a little bit better.  She had radiation therapy to the cervical spine.  This was completed back in early December.  She has had no cough or shortness of breath.  Her appetite has been good.  She is no nausea or vomiting.  She had no change in bowel or bladder habits.  There is been no leg swelling.  She has had no rashes.  Currently, her performance status is ECOG 1.  Medications:  Current Outpatient Medications:  .  amitriptyline (ELAVIL) 10 MG tablet, TAKE 1 TABLET BY MOUTH EVERYDAY AT BEDTIME, Disp: 90 tablet, Rfl: 1 .  bisoprolol-hydrochlorothiazide (ZIAC) 2.5-6.25 MG tablet, TAKE 1 TABLET DAILY, Disp: 90 tablet, Rfl: 3 .  Blood Glucose Monitoring Suppl (ONE TOUCH ULTRA 2) w/Device KIT, Use to check blood sugars twice a day Dx E11.9, Disp: 1 each, Rfl: 0 .  calcium carbonate (OS-CAL) 600 MG tablet, Take 600 mg by mouth daily., Disp: , Rfl:  .  cholecalciferol (VITAMIN D) 1000 units tablet, Take 1,000 Units by mouth daily., Disp: , Rfl:  .  gabapentin (NEURONTIN) 100 MG capsule, Take 100 mg by mouth 3 (three) times daily. , Disp: , Rfl:  .  glucose blood (ONE TOUCH ULTRA TEST) test strip, 1  each by Other route 2 (two) times daily. Use to check blood sugars twice a day, Disp: 100 each, Rfl: 3 .  HYDROcodone-homatropine (HYCODAN) 5-1.5 MG/5ML syrup, Take 5 mLs by mouth every 6 (six) hours as needed for cough., Disp: 120 mL, Rfl: 0 .  Lancets (ONETOUCH ULTRASOFT) lancets, 1 each by Other route 2 (two) times daily. Use to help check blood sugars twice a day, Disp: 100 each, Rfl: 3 .  lovastatin (MEVACOR) 20 MG tablet, TAKE 1 TABLET BY MOUTH EVERY OTHER DAY, Disp: 90 tablet, Rfl: 1 .  metFORMIN (GLUCOPHAGE) 500 MG tablet, TAKE 1 TABLET BY MOUTH TWICE A DAY, Disp: 180 tablet, Rfl: 1 .  methimazole (TAPAZOLE) 5 MG tablet, Take 5 mg alternating with 2.5 mg every other day, Disp: 90 tablet, Rfl: 2 .  Multiple Vitamins-Minerals (VISION-VITE PRESERVE PO), Take 1 tablet by mouth daily. , Disp: , Rfl:  .  Probiotic Product (ALIGN PO), Take by mouth daily., Disp: , Rfl:  .  ramipril (ALTACE) 5 MG capsule, Take 5 mg by mouth every evening., Disp: , Rfl:   Allergies:  Allergies  Allergen Reactions  . Tramadol Other (See Comments)    syncope  . Codeine Nausea And Vomiting  . Demerol Nausea Only    Past Medical History, Surgical history, Social history, and Family History were reviewed and updated.  Review of Systems: As stated in the interim history  Physical Exam:  weight is 120 lb 12.8 oz (54.8 kg). Her oral temperature is 98.1 F (36.7 C). Her blood pressure is 150/59 (abnormal) and her pulse is 73. Her respiration is 19 and oxygen saturation is 100%.   Wt Readings from Last 3 Encounters:  05/12/17 120 lb 12.8 oz (54.8 kg)  04/29/17 118 lb 9.6 oz (53.8 kg)  04/07/17 120 lb 8 oz (54.7 kg)     Physical Exam  Constitutional: She is oriented to person, place, and time.  HENT:  Head: Normocephalic and atraumatic.  Mouth/Throat: Oropharynx is clear and moist.  Eyes: EOM are normal. Pupils are equal, round, and reactive to light.  Neck: Normal range of motion.  Cardiovascular:  Normal rate, regular rhythm and normal heart sounds.  Pulmonary/Chest: Effort normal and breath sounds normal.  Abdominal: Soft. Bowel sounds are normal.  Musculoskeletal: Normal range of motion. She exhibits no edema, tenderness or deformity.  She does have tenderness in the posterior left shoulder. She has moderate decreased range of motion of the left shoulder. She has partial abduction of the left arm.  Lymphadenopathy:    She has no cervical adenopathy.  Neurological: She is alert and oriented to person, place, and time.  Skin: Skin is warm and dry. No rash noted. No erythema.  Psychiatric: She has a normal mood and affect. Her behavior is normal. Judgment and thought content normal.  Vitals reviewed.    Lab Results  Component Value Date   WBC 6.2 05/12/2017   HGB 11.5 (L) 04/07/2017   HCT 38.0 05/12/2017   MCV 89.4 05/12/2017   PLT 238 05/12/2017     Chemistry      Component Value Date/Time   NA 144 05/12/2017 1350   NA 144 04/07/2017 1215   NA 135 (L) 03/11/2017 1416   K 4.0 05/12/2017 1350   K 4.0 04/07/2017 1215   K 3.9 03/11/2017 1416   CL 104 05/12/2017 1350   CL 103 04/07/2017 1215   CO2 28 05/12/2017 1350   CO2 29 04/07/2017 1215   CO2 23 03/11/2017 1416   BUN 15 05/12/2017 1350   BUN 13 04/07/2017 1215   BUN 16.1 03/11/2017 1416   CREATININE 0.8 04/07/2017 1215   CREATININE 0.9 03/11/2017 1416      Component Value Date/Time   CALCIUM 9.3 05/12/2017 1350   CALCIUM 9.6 04/07/2017 1215   CALCIUM 9.6 03/11/2017 1416   ALKPHOS 59 05/12/2017 1350   ALKPHOS 60 04/07/2017 1215   ALKPHOS 79 03/11/2017 1416   AST 20 05/12/2017 1350   AST 11 03/11/2017 1416   ALT 20 05/12/2017 1350   ALT 15 04/07/2017 1215   ALT 11 03/11/2017 1416   BILITOT 0.5 05/12/2017 1350   BILITOT 0.26 03/11/2017 1416         Impression and Plan: Samantha Quinn is a 84 year-old white female. She has progressive non-small cell lung cancer. This is adenocarcinoma.  She is making a  very nice come back.  She really looks good.  She will get her Zometa today.  Hopefully, we will be able to move this treatment out to every 6 weeks or longer.  I would like to get a PET scan on her so we can see exactly what her cancer is doing.  We have done genetic analysis on her malignancy.  Unfortunately, there is no mutation that we can target with 1 of our oral drugs.  We certainly could consider immunotherapy again.  Possibly a different immunotherapeutic  drug could be considered.  We are looking at her quality of life.  This is really the key and I believe any treatment decisions need to be focused on this.  We will see her back after her PET scan and give her the Zometa.    [name], MD 1/16/20193:28 PM

## 2017-05-12 NOTE — Patient Instructions (Signed)

## 2017-05-13 LAB — LACTATE DEHYDROGENASE: LDH: 174 U/L (ref 125–245)

## 2017-05-21 ENCOUNTER — Other Ambulatory Visit: Payer: Self-pay | Admitting: Internal Medicine

## 2017-05-21 ENCOUNTER — Encounter: Payer: Self-pay | Admitting: Hematology & Oncology

## 2017-05-26 ENCOUNTER — Ambulatory Visit (HOSPITAL_COMMUNITY): Payer: Medicare Other

## 2017-05-27 ENCOUNTER — Ambulatory Visit: Payer: Self-pay | Admitting: Radiation Oncology

## 2017-06-01 ENCOUNTER — Encounter: Payer: Self-pay | Admitting: Oncology

## 2017-06-02 ENCOUNTER — Encounter (HOSPITAL_COMMUNITY)
Admission: RE | Admit: 2017-06-02 | Discharge: 2017-06-02 | Disposition: A | Discharge status: Home / Self Care | Payer: Medicare Other | Source: Ambulatory Visit | Attending: Hematology & Oncology | Admitting: Hematology & Oncology

## 2017-06-02 DIAGNOSIS — C3490 Malignant neoplasm of unspecified part of unspecified bronchus or lung: Secondary | ICD-10-CM | POA: Diagnosis not present

## 2017-06-02 DIAGNOSIS — C3431 Malignant neoplasm of lower lobe, right bronchus or lung: Secondary | ICD-10-CM | POA: Diagnosis not present

## 2017-06-02 LAB — GLUCOSE, CAPILLARY: Glucose-Capillary: 112 mg/dL — ABNORMAL HIGH (ref 65–99)

## 2017-06-02 MED ORDER — FLUDEOXYGLUCOSE F - 18 (FDG) INJECTION: 5.9000 | Freq: Once | INTRAVENOUS | Status: DC | PRN

## 2017-06-03 ENCOUNTER — Ambulatory Visit
Admission: RE | Admit: 2017-06-03 | Discharge: 2017-06-03 | Disposition: A | Discharge status: Home / Self Care | Payer: Medicare Other | Source: Ambulatory Visit | Attending: Radiation Oncology | Admitting: Radiation Oncology

## 2017-06-03 ENCOUNTER — Encounter: Payer: Self-pay | Admitting: Radiation Oncology

## 2017-06-03 ENCOUNTER — Other Ambulatory Visit: Payer: Self-pay

## 2017-06-03 DIAGNOSIS — Z7984 Long term (current) use of oral hypoglycemic drugs: Secondary | ICD-10-CM | POA: Diagnosis not present

## 2017-06-03 DIAGNOSIS — Z923 Personal history of irradiation: Secondary | ICD-10-CM | POA: Insufficient documentation

## 2017-06-03 DIAGNOSIS — C7951 Secondary malignant neoplasm of bone: Secondary | ICD-10-CM

## 2017-06-03 DIAGNOSIS — Z885 Allergy status to narcotic agent status: Secondary | ICD-10-CM | POA: Insufficient documentation

## 2017-06-03 DIAGNOSIS — Z888 Allergy status to other drugs, medicaments and biological substances status: Secondary | ICD-10-CM | POA: Insufficient documentation

## 2017-06-03 DIAGNOSIS — C3490 Malignant neoplasm of unspecified part of unspecified bronchus or lung: Secondary | ICD-10-CM | POA: Diagnosis not present

## 2017-06-03 DIAGNOSIS — Z79899 Other long term (current) drug therapy: Secondary | ICD-10-CM | POA: Insufficient documentation

## 2017-06-03 NOTE — Progress Notes (Signed)
Samantha Quinn is here for follow up after treatment to her cervical spine.  She denies having pain.  She continues to have some difficulty swallowing but says it has improved.  She has hyperpigmentation on the back of her neck.  She reports having trouble raising her left arm and lifting.  She also reports having a dry cough that has worsened some since surgery.  She takes hycodan at night.  BP 116/61 (BP Location: Left Arm, Patient Position: Sitting)   Pulse 75   Temp 97.9 F (36.6 C) (Oral)   Ht 5\' 1"  (1.549 m)   Wt 120 lb 9.6 oz (54.7 kg)   SpO2 100%   BMI 22.79 kg/m    Wt Readings from Last 3 Encounters:  06/03/17 120 lb 9.6 oz (54.7 kg)  05/12/17 120 lb 12.8 oz (54.8 kg)  04/29/17 118 lb 9.6 oz (53.8 kg)

## 2017-06-03 NOTE — Progress Notes (Signed)
Radiation Oncology         [phone] ________________________________  Name: [name] MRN: [id number]  Date: 06/03/2017  DOB: 29-Nov-1927  Follow-Up Visit Note  CC: Samantha Rail, MD  Samantha Napoleon, MD    ICD-10-CM   1. Lung cancer metastatic to bone [facility]) C34.90    C79.51     Diagnosis:   Recurrent non-small cell lung cancer  Interval Since Last Radiation:  1 month, 7 days  Site/dose:   Cervical spine / 35 Gy  delivered in 14 fractions, postop  Radiation treatment dates:   04/01/17-04/26/17    Narrative:  The patient returns today for routine follow-up. Since her last visit, the pt underwent a PET scan on 06/02/2017 that revealed, IMPRESSION: Previously described right lower lobe pulmonary nodule is decreased in size and degree of FDG uptake. Decrease in size with stable FDG uptake associated with previous index subpleural nodular opacity in the medial right lower lobe/infrahilar region. No significant change in degree of FDG uptake associated with hypermetabolic bone lesions involving C7 and T2. No new hypermetabolic bone lesions identified. Moderate right pleural effusion. Unchanged. Pt states that she is not on any therapy. Pt notes that her ability to swallow has improved. Pt reports some itchiness on her back.Pt denies any pain at this time. Pt is doing well overall.                  ALLERGIES:  is allergic to tramadol; codeine; and demerol.  Meds: Current Outpatient Medications  Medication Sig Dispense Refill  . amitriptyline (ELAVIL) 10 MG tablet TAKE 1 TABLET BY MOUTH EVERYDAY AT BEDTIME 90 tablet 1  . bisoprolol-hydrochlorothiazide (ZIAC) 2.5-6.25 MG tablet TAKE 1 TABLET DAILY 90 tablet 3  . Blood Glucose Monitoring Suppl (ONE TOUCH ULTRA 2) w/Device KIT Use to check blood sugars twice a day Dx E11.9 1 each 0  . calcium carbonate (OS-CAL) 600 MG tablet Take 600 mg by mouth daily.    . cholecalciferol (VITAMIN D) 1000 units tablet Take 1,000 Units by mouth  daily.    [name] gabapentin (NEURONTIN) 100 MG capsule Take 100 mg by mouth 3 (three) times daily.     [name] glucose blood (ONE TOUCH ULTRA TEST) test strip 1 each by Other route 2 (two) times daily. Use to check blood sugars twice a day 100 each 3  . HYDROcodone-homatropine (HYCODAN) 5-1.5 MG/5ML syrup Take 5 mLs by mouth every 6 (six) hours as needed for cough. 120 mL 0  . Lancets (ONETOUCH ULTRASOFT) lancets 1 each by Other route 2 (two) times daily. Use to help check blood sugars twice a day 100 each 3  . lovastatin (MEVACOR) 20 MG tablet TAKE 1 TABLET BY MOUTH EVERY OTHER DAY 90 tablet 1  . metFORMIN (GLUCOPHAGE) 500 MG tablet TAKE 1 TABLET BY MOUTH TWICE A DAY 180 tablet 1  . methimazole (TAPAZOLE) 5 MG tablet Take 5 mg alternating with 2.5 mg every other day 90 tablet 2  . Multiple Vitamins-Minerals (VISION-VITE PRESERVE PO) Take 1 tablet by mouth daily.     . Probiotic Product (ALIGN PO) Take by mouth daily.    . ramipril (ALTACE) 2.5 MG capsule TAKE 1 CAPSULE BY MOUTH EVERY DAY 90 capsule 1   No current facility-administered medications for this encounter.    Facility-Administered Medications Ordered in Other Encounters  Medication Dose Route Frequency Provider Last Rate Last Dose  . fludeoxyglucose F - 18 (FDG) injection 5.9 millicurie  5.9 millicurie Intravenous Once  PRN Kalman Jewels, MD        Physical Findings: The patient is in no acute distress. Patient is alert and oriented.  height is _0  (1.549 m) and weight is 120 lb 9.6 oz (54.7 kg). Her oral temperature is 97.9 F (36.6 C). Her blood pressure is 116/61 and her pulse is 75. Her oxygen saturation is 100%. .   Lungs are clear to auscultation bilaterally. Heart has regular rate and rhythm. No palpable cervical, supraclavicular, or axillary adenopathy. Abdomen soft, non-tender, normal bowel sounds. Mild hyperpigmentation on the back of her neck.   Lab Findings: Lab Results  Component Value Date   WBC 6.2 05/12/2017   HGB 11.5  (L) 04/07/2017   HCT 38.0 05/12/2017   MCV 89.4 05/12/2017   PLT 238 05/12/2017    Radiographic Findings: Nm Pet Image Restag (ps) Skull Base To Thigh  Result Date: 06/02/2017 CLINICAL DATA:  Subsequent treatment strategy for metastatic non-small cell lung cancer. EXAM: NUCLEAR MEDICINE PET SKULL BASE TO THIGH TECHNIQUE: 5.98 mCi F-18 FDG was injected intravenously. Full-ring PET imaging was performed from the skull base to thigh after the radiotracer. CT data was obtained and used for attenuation correction and anatomic localization. FASTING BLOOD GLUCOSE:  Value: 112 mg/dl COMPARISON:  03/30/2017 FINDINGS: NECK: No hypermetabolic lymph nodes in the neck. CHEST: Normal heart size. Aortic atherosclerosis. No pericardial effusion. Thyroid goiter noted. Trachea patent and midline. Unremarkable esophagus. No hypermetabolic mediastinal or hilar lymph nodes. No hypermetabolic axillary or supraclavicular nodes. Moderate right pleural effusion. Previous 8 mm right lower lobe lung nodule is decreased in size now measuring 5 mm. SUV max equals 0.8. Previously this measured 8 mm and had an SUV max of 2.4. Medial right lower lobe/infrahilar soft tissue nodule measures 0.8 x 2.0 cm and has an SUV max equal to 3.19. Previously 2.1 x 1.0 cm within SUV max of 3.8. Masslike architectural distortion and fibrosis within the paramediastinal right mid and right upper lung zone is unchanged compatible with changes due to external beam radiation. ABDOMEN/PELVIS: No abnormal uptake identified within the liver, pancreas or spleen. Large rim calcified cyst arises from the upper pole of the spleen. No abnormal uptake within the adrenal glands. Small nonobstructing left renal calculus. Aortic atherosclerosis. Small fat containing right inguinal hernia. SKELETON: Postsurgical changes related to C5 through T2 posterior cervical fusion. Focal hypermetabolism involving the left aspect of the C7 vertebra has an SUV max equal to 6.23.  Previously 8.37. Hypermetabolism involving the left aspect of the T2 vertebra has an SUV max equal to 5.58. Previously 6.03. IMPRESSION: 1. Previously described right lower lobe pulmonary nodule is decreased in size and degree of FDG uptake. 2. Decrease in size with stable FDG uptake associated with previous index subpleural nodular opacity in the medial right lower lobe/infrahilar region. 3. No significant change in degree of FDG uptake associated with hypermetabolic bone lesions involving C7 and T2. No new hypermetabolic bone lesions identified. 4. Moderate right pleural effusion.  Unchanged. Electronically Signed   By: Kerby Moors M.D.   On: 06/02/2017 13:46    Impression:  The patient is recovering from the effects of radiation. Pt denies having any pain at this time and her swallowing continues to improve.   Plan:  prn with Radiation Oncology and the pt will continue close follow up with Medical Oncology.   -----------------------------------  Blair Promise, PhD, MD   This document serves as a record of services personally performed by Gery Pray, MD. It  was created on his behalf by Valeta Harms, a trained medical scribe. The creation of this record is based on the scribe's personal observations and the provider's statements to them. This document has been checked and approved by the attending provider.

## 2017-06-04 ENCOUNTER — Telehealth: Payer: Self-pay | Admitting: *Deleted

## 2017-06-04 NOTE — Telephone Encounter (Signed)
Received call from patient's daughter. She has been exposed to the flu and has been given tamiflu prophylactically. She hasn't seen patient since Wednesday but wants to know if Randee, also should start tamiflu. In either case, she would also like to know the time period in which she should avoid contact with susceptible individuals.   Spoke with Dr Marin Olp. He does not wish to prescribe tamiflu to patient since she has not had a direct exposure. He advises that the daughter should avoid contact for 7 days.   Daughter, [name] is aware of recommendations

## 2017-06-09 ENCOUNTER — Inpatient Hospital Stay: Payer: Medicare Other

## 2017-06-09 ENCOUNTER — Inpatient Hospital Stay: Payer: Medicare Other | Attending: Hematology & Oncology | Admitting: Hematology & Oncology

## 2017-06-09 ENCOUNTER — Other Ambulatory Visit: Payer: Self-pay

## 2017-06-09 VITALS — BP 149/66 | HR 79 | Temp 98.1°F | Resp 20 | Wt 120.8 lb

## 2017-06-09 DIAGNOSIS — Z923 Personal history of irradiation: Secondary | ICD-10-CM | POA: Insufficient documentation

## 2017-06-09 DIAGNOSIS — Z9221 Personal history of antineoplastic chemotherapy: Secondary | ICD-10-CM

## 2017-06-09 DIAGNOSIS — C3491 Malignant neoplasm of unspecified part of right bronchus or lung: Secondary | ICD-10-CM | POA: Diagnosis not present

## 2017-06-09 DIAGNOSIS — Z7984 Long term (current) use of oral hypoglycemic drugs: Secondary | ICD-10-CM | POA: Insufficient documentation

## 2017-06-09 DIAGNOSIS — C3431 Malignant neoplasm of lower lobe, right bronchus or lung: Secondary | ICD-10-CM

## 2017-06-09 DIAGNOSIS — C3490 Malignant neoplasm of unspecified part of unspecified bronchus or lung: Secondary | ICD-10-CM

## 2017-06-09 DIAGNOSIS — C7951 Secondary malignant neoplasm of bone: Secondary | ICD-10-CM | POA: Diagnosis not present

## 2017-06-09 DIAGNOSIS — D508 Other iron deficiency anemias: Secondary | ICD-10-CM

## 2017-06-09 DIAGNOSIS — Z79899 Other long term (current) drug therapy: Secondary | ICD-10-CM | POA: Insufficient documentation

## 2017-06-09 LAB — CMP (CANCER CENTER ONLY)
ALT: 15 U/L (ref 10–47)
AST: 22 U/L (ref 11–38)
Albumin: 3.6 g/dL (ref 3.5–5.0)
Alkaline Phosphatase: 68 U/L (ref 26–84)
Anion gap: 11 (ref 5–15)
BUN: 15 mg/dL (ref 7–22)
CO2: 30 mmol/L (ref 18–33)
Calcium: 9.7 mg/dL (ref 8.0–10.3)
Chloride: 101 mmol/L (ref 98–108)
Creatinine: 0.4 mg/dL — ABNORMAL LOW (ref 0.60–1.20)
Glucose, Bld: 118 mg/dL (ref 73–118)
Potassium: 4 mmol/L (ref 3.3–4.7)
Sodium: 142 mmol/L (ref 128–145)
Total Bilirubin: 0.6 mg/dL (ref 0.2–1.6)
Total Protein: 7.1 g/dL (ref 6.4–8.1)

## 2017-06-09 LAB — CBC WITH DIFFERENTIAL (CANCER CENTER ONLY)
Basophils Absolute: 0 K/uL (ref 0.0–0.1)
Basophils Relative: 0 %
Eosinophils Absolute: 0.1 K/uL (ref 0.0–0.5)
Eosinophils Relative: 1 %
HCT: 40 % (ref 34.8–46.6)
Hemoglobin: 13.2 g/dL (ref 11.6–15.9)
Lymphocytes Relative: 9 %
Lymphs Abs: 0.6 K/uL — ABNORMAL LOW (ref 0.9–3.3)
MCH: 29.4 pg (ref 26.0–34.0)
MCHC: 33 g/dL (ref 32.0–36.0)
MCV: 89.1 fL (ref 81.0–101.0)
Monocytes Absolute: 0.5 K/uL (ref 0.1–0.9)
Monocytes Relative: 7 %
Neutro Abs: 6.1 K/uL (ref 1.5–6.5)
Neutrophils Relative %: 83 %
Platelet Count: 245 K/uL (ref 145–400)
RBC: 4.49 MIL/uL (ref 3.70–5.32)
RDW: 14.6 % (ref 11.1–15.7)
WBC Count: 7.3 K/uL (ref 3.9–10.0)

## 2017-06-09 MED ORDER — ZOLEDRONIC ACID 4 MG/5ML IV CONC
3.0000 mg | Freq: Once | INTRAVENOUS | Status: AC
  Administered 2017-06-09: 3 mg via INTRAVENOUS
  Filled 2017-06-09: qty 3.75

## 2017-06-09 MED ORDER — SODIUM CHLORIDE 0.9 % IV SOLN
INTRAVENOUS | Status: DC
  Administered 2017-06-09: 16:00:00 via INTRAVENOUS

## 2017-06-09 NOTE — Progress Notes (Signed)
Dictation #1 [id number]  [id number]:[id number]  Hematology and Oncology Follow Up Visit  Samantha Quinn [id number] 28-Feb-1928 84 y.o. 06/09/2017   Principle Diagnosis:   Stage IV adenocarcinoma of the right lung-mutation negative  C6-7 left nerve root compression secondary to metastatic disease - PD-L1 (0%) /TMB of 9  Current Therapy:    Tecentriq 1200 mg IV every 3 weeks s/p cycle #8  Status post radiation/chemotherapy for recurrence-completed October 2017  Status post right upper lobectomy in March 2013 for stage IB disease  XRT to the cervical spine - started 04/02/2017  Zometa 4 mg IV q 2 months     Interim History:  Samantha Quinn is back for a visit.  She continues to look better.  She is feeling better.  We did do a PET scan on her.  This was done on June 02, 2017.  This showed improvement in her disease.  A right lower lobe pulmonary nodule has decreased in size.  A second right lower lobe nodule also is decreased in size.  There is stability of hypermetabolic lesions in C7 and T2.  She is not complaining of any pain.  There is no shortness of breath.  She has no bleeding.  Her appetite is good.  She has had no change in bowel or bladder habits.  Overall, I would say that her performance status is ECOG 2-3.  Medications:  Current Outpatient Medications:  .  amitriptyline (ELAVIL) 10 MG tablet, TAKE 1 TABLET BY MOUTH EVERYDAY AT BEDTIME, Disp: 90 tablet, Rfl: 1 .  bisoprolol-hydrochlorothiazide (ZIAC) 2.5-6.25 MG tablet, TAKE 1 TABLET DAILY, Disp: 90 tablet, Rfl: 3 .  Blood Glucose Monitoring Suppl (ONE TOUCH ULTRA 2) w/Device KIT, Use to check blood sugars twice a day Dx E11.9, Disp: 1 each, Rfl: 0 .  calcium carbonate (OS-CAL) 600 MG tablet, Take 600 mg by mouth daily., Disp: , Rfl:  .  cholecalciferol (VITAMIN D) 1000 units tablet, Take 1,000 Units by mouth daily., Disp: , Rfl:  .  gabapentin (NEURONTIN) 100 MG capsule, Take 100 mg by mouth 3 (three) times daily. , Disp: ,  Rfl:  .  glucose blood (ONE TOUCH ULTRA TEST) test strip, 1 each by Other route 2 (two) times daily. Use to check blood sugars twice a day, Disp: 100 each, Rfl: 3 .  HYDROcodone-homatropine (HYCODAN) 5-1.5 MG/5ML syrup, Take 5 mLs by mouth every 6 (six) hours as needed for cough., Disp: 120 mL, Rfl: 0 .  Lancets (ONETOUCH ULTRASOFT) lancets, 1 each by Other route 2 (two) times daily. Use to help check blood sugars twice a day, Disp: 100 each, Rfl: 3 .  lovastatin (MEVACOR) 20 MG tablet, TAKE 1 TABLET BY MOUTH EVERY OTHER DAY, Disp: 90 tablet, Rfl: 1 .  metFORMIN (GLUCOPHAGE) 500 MG tablet, TAKE 1 TABLET BY MOUTH TWICE A DAY, Disp: 180 tablet, Rfl: 1 .  methimazole (TAPAZOLE) 5 MG tablet, Take 5 mg alternating with 2.5 mg every other day, Disp: 90 tablet, Rfl: 2 .  Multiple Vitamins-Minerals (VISION-VITE PRESERVE PO), Take 1 tablet by mouth daily. , Disp: , Rfl:  .  Probiotic Product (ALIGN PO), Take by mouth daily., Disp: , Rfl:  .  ramipril (ALTACE) 2.5 MG capsule, TAKE 1 CAPSULE BY MOUTH EVERY DAY, Disp: 90 capsule, Rfl: 1 No current facility-administered medications for this visit.   Facility-Administered Medications Ordered in Other Visits:  .  0.9 %  sodium chloride infusion, , Intravenous, Continuous, Evelynn Hench, Rudell Cobb, MD, Stopped at 06/09/17 1643  Allergies:  Allergies  Allergen Reactions  . Tramadol Other (See Comments)    syncope  . Codeine Nausea And Vomiting  . Demerol Nausea Only    Past Medical History, Surgical history, Social history, and Family History were reviewed and updated.  Review of Systems: Review of Systems  Constitutional: Negative.   HENT: Negative.   Eyes: Negative.   Respiratory: Negative.   Cardiovascular: Negative.   Gastrointestinal: Negative.   Genitourinary: Negative.   Musculoskeletal: Negative.   Skin: Negative.   Neurological: Negative.   Endo/Heme/Allergies: Negative.   Psychiatric/Behavioral: Negative.     Physical Exam:  weight is 120  lb 12.8 oz (54.8 kg). Her oral temperature is 98.1 F (36.7 C). Her blood pressure is 149/66 (abnormal) and her pulse is 79. Her respiration is 20 and oxygen saturation is 99%.   Wt Readings from Last 3 Encounters:  06/09/17 120 lb 12.8 oz (54.8 kg)  06/03/17 120 lb 9.6 oz (54.7 kg)  05/12/17 120 lb 12.8 oz (54.8 kg)     Physical Exam  Constitutional: She is oriented to person, place, and time.  HENT:  Head: Normocephalic and atraumatic.  Mouth/Throat: Oropharynx is clear and moist.  Eyes: EOM are normal. Pupils are equal, round, and reactive to light.  Neck: Normal range of motion.  Cardiovascular: Normal rate, regular rhythm and normal heart sounds.  Pulmonary/Chest: Effort normal and breath sounds normal.  Abdominal: Soft. Bowel sounds are normal.  Musculoskeletal: Normal range of motion. She exhibits no edema, tenderness or deformity.  She does have tenderness in the posterior left shoulder. She has moderate decreased range of motion of the left shoulder. She has partial abduction of the left arm.  Lymphadenopathy:    She has no cervical adenopathy.  Neurological: She is alert and oriented to person, place, and time.  Skin: Skin is warm and dry. No rash noted. No erythema.  Psychiatric: She has a normal mood and affect. Her behavior is normal. Judgment and thought content normal.  Vitals reviewed.    Lab Results  Component Value Date   WBC 7.3 06/09/2017   HGB 11.5 (L) 04/07/2017   HCT 40.0 06/09/2017   MCV 89.1 06/09/2017   PLT 245 06/09/2017     Chemistry      Component Value Date/Time   NA 142 06/09/2017 1417   NA 144 04/07/2017 1215   NA 135 (L) 03/11/2017 1416   K 4.0 06/09/2017 1417   K 4.0 04/07/2017 1215   K 3.9 03/11/2017 1416   CL 101 06/09/2017 1417   CL 103 04/07/2017 1215   CO2 30 06/09/2017 1417   CO2 29 04/07/2017 1215   CO2 23 03/11/2017 1416   BUN 15 06/09/2017 1417   BUN 13 04/07/2017 1215   BUN 16.1 03/11/2017 1416   CREATININE 0.40 (L)  06/09/2017 1417   CREATININE 0.8 04/07/2017 1215   CREATININE 0.9 03/11/2017 1416      Component Value Date/Time   CALCIUM 9.7 06/09/2017 1417   CALCIUM 9.6 04/07/2017 1215   CALCIUM 9.6 03/11/2017 1416   ALKPHOS 68 06/09/2017 1417   ALKPHOS 60 04/07/2017 1215   ALKPHOS 79 03/11/2017 1416   AST 22 06/09/2017 1417   AST 11 03/11/2017 1416   ALT 15 06/09/2017 1417   ALT 15 04/07/2017 1215   ALT 11 03/11/2017 1416   BILITOT 0.6 06/09/2017 1417   BILITOT 0.26 03/11/2017 1416         Impression and Plan: Ms. Redler is a 84 year-old white  female. She has progressive non-small cell lung cancer. This is adenocarcinoma.  She is making a very nice come back.  She really looks good.  She will get her Zometa today.  Hopefully, we will be able to move this treatment out to every 2 months.  I would like to get a PET scan on her in 2 months.  I would like to see what type of changes are noted.  Hopefully, we will find that everything is still looking stable.  I am just glad that her quality of life is doing well.  She is able to do what she likes.    Again, she really does not have any targeted molecular markers that we can take advantage of for therapy.  I really would hate to have to go to chemotherapy.  [name], MD 2/13/20194:44 PM

## 2017-06-09 NOTE — Patient Instructions (Signed)

## 2017-06-10 ENCOUNTER — Other Ambulatory Visit: Payer: Self-pay | Admitting: Internal Medicine

## 2017-06-10 DIAGNOSIS — E119 Type 2 diabetes mellitus without complications: Secondary | ICD-10-CM

## 2017-06-22 DIAGNOSIS — M4716 Other spondylosis with myelopathy, lumbar region: Secondary | ICD-10-CM | POA: Diagnosis not present

## 2017-07-23 DIAGNOSIS — D225 Melanocytic nevi of trunk: Secondary | ICD-10-CM | POA: Diagnosis not present

## 2017-07-23 DIAGNOSIS — L578 Other skin changes due to chronic exposure to nonionizing radiation: Secondary | ICD-10-CM | POA: Diagnosis not present

## 2017-07-23 DIAGNOSIS — D1801 Hemangioma of skin and subcutaneous tissue: Secondary | ICD-10-CM | POA: Diagnosis not present

## 2017-07-23 DIAGNOSIS — Z85828 Personal history of other malignant neoplasm of skin: Secondary | ICD-10-CM | POA: Diagnosis not present

## 2017-07-23 DIAGNOSIS — Z85820 Personal history of malignant melanoma of skin: Secondary | ICD-10-CM | POA: Diagnosis not present

## 2017-07-23 DIAGNOSIS — D2261 Melanocytic nevi of right upper limb, including shoulder: Secondary | ICD-10-CM | POA: Diagnosis not present

## 2017-07-23 DIAGNOSIS — L570 Actinic keratosis: Secondary | ICD-10-CM | POA: Diagnosis not present

## 2017-07-23 DIAGNOSIS — D485 Neoplasm of uncertain behavior of skin: Secondary | ICD-10-CM | POA: Diagnosis not present

## 2017-07-23 DIAGNOSIS — L821 Other seborrheic keratosis: Secondary | ICD-10-CM | POA: Diagnosis not present

## 2017-07-28 ENCOUNTER — Ambulatory Visit (INDEPENDENT_AMBULATORY_CARE_PROVIDER_SITE_OTHER): Payer: Medicare Other | Admitting: Ophthalmology

## 2017-07-28 DIAGNOSIS — E113591 Type 2 diabetes mellitus with proliferative diabetic retinopathy without macular edema, right eye: Secondary | ICD-10-CM | POA: Diagnosis not present

## 2017-07-28 DIAGNOSIS — E113392 Type 2 diabetes mellitus with moderate nonproliferative diabetic retinopathy without macular edema, left eye: Secondary | ICD-10-CM | POA: Diagnosis not present

## 2017-07-28 DIAGNOSIS — H353132 Nonexudative age-related macular degeneration, bilateral, intermediate dry stage: Secondary | ICD-10-CM

## 2017-07-28 DIAGNOSIS — I10 Essential (primary) hypertension: Secondary | ICD-10-CM

## 2017-07-28 DIAGNOSIS — E11319 Type 2 diabetes mellitus with unspecified diabetic retinopathy without macular edema: Secondary | ICD-10-CM | POA: Diagnosis not present

## 2017-07-28 DIAGNOSIS — H43813 Vitreous degeneration, bilateral: Secondary | ICD-10-CM

## 2017-07-28 DIAGNOSIS — H35033 Hypertensive retinopathy, bilateral: Secondary | ICD-10-CM | POA: Diagnosis not present

## 2017-07-28 LAB — HM DIABETES EYE EXAM

## 2017-08-03 ENCOUNTER — Encounter: Payer: Self-pay | Admitting: Internal Medicine

## 2017-08-04 ENCOUNTER — Ambulatory Visit (HOSPITAL_COMMUNITY)
Admission: RE | Admit: 2017-08-04 | Discharge: 2017-08-04 | Disposition: A | Discharge status: Home / Self Care | Payer: Medicare Other | Source: Ambulatory Visit | Attending: Hematology & Oncology | Admitting: Hematology & Oncology

## 2017-08-04 DIAGNOSIS — C3490 Malignant neoplasm of unspecified part of unspecified bronchus or lung: Secondary | ICD-10-CM | POA: Diagnosis not present

## 2017-08-04 DIAGNOSIS — Z923 Personal history of irradiation: Secondary | ICD-10-CM | POA: Insufficient documentation

## 2017-08-04 DIAGNOSIS — I700 Atherosclerosis of aorta: Secondary | ICD-10-CM | POA: Diagnosis not present

## 2017-08-04 DIAGNOSIS — C7951 Secondary malignant neoplasm of bone: Secondary | ICD-10-CM | POA: Diagnosis not present

## 2017-08-04 DIAGNOSIS — E042 Nontoxic multinodular goiter: Secondary | ICD-10-CM | POA: Diagnosis not present

## 2017-08-04 DIAGNOSIS — N200 Calculus of kidney: Secondary | ICD-10-CM | POA: Diagnosis not present

## 2017-08-04 LAB — GLUCOSE, CAPILLARY: Glucose-Capillary: 127 mg/dL — ABNORMAL HIGH (ref 65–99)

## 2017-08-04 MED ORDER — FLUDEOXYGLUCOSE F - 18 (FDG) INJECTION
6.0600 | Freq: Once | INTRAVENOUS | Status: AC | PRN
  Administered 2017-08-04: 6.06 via INTRAVENOUS

## 2017-08-09 ENCOUNTER — Telehealth: Payer: Self-pay | Admitting: *Deleted

## 2017-08-09 DIAGNOSIS — M542 Cervicalgia: Secondary | ICD-10-CM | POA: Diagnosis not present

## 2017-08-09 NOTE — Telephone Encounter (Addendum)
-----   Message from [name], MD sent at 08/08/2017  8:19 PM EDT ----- Called patient to let her know the PET scan looks a little better!!!  NO new areas of cancer!!!

## 2017-08-11 ENCOUNTER — Inpatient Hospital Stay: Payer: Medicare Other

## 2017-08-11 ENCOUNTER — Other Ambulatory Visit: Payer: Self-pay

## 2017-08-11 ENCOUNTER — Inpatient Hospital Stay: Payer: Medicare Other | Attending: Hematology & Oncology | Admitting: Family

## 2017-08-11 ENCOUNTER — Encounter: Payer: Self-pay | Admitting: Family

## 2017-08-11 VITALS — BP 154/66 | HR 70 | Temp 98.1°F | Resp 20 | Wt 121.0 lb

## 2017-08-11 DIAGNOSIS — Z923 Personal history of irradiation: Secondary | ICD-10-CM | POA: Insufficient documentation

## 2017-08-11 DIAGNOSIS — M542 Cervicalgia: Secondary | ICD-10-CM

## 2017-08-11 DIAGNOSIS — C3411 Malignant neoplasm of upper lobe, right bronchus or lung: Secondary | ICD-10-CM | POA: Diagnosis not present

## 2017-08-11 DIAGNOSIS — E119 Type 2 diabetes mellitus without complications: Secondary | ICD-10-CM | POA: Insufficient documentation

## 2017-08-11 DIAGNOSIS — C3490 Malignant neoplasm of unspecified part of unspecified bronchus or lung: Secondary | ICD-10-CM

## 2017-08-11 DIAGNOSIS — C7951 Secondary malignant neoplasm of bone: Secondary | ICD-10-CM | POA: Insufficient documentation

## 2017-08-11 DIAGNOSIS — C3431 Malignant neoplasm of lower lobe, right bronchus or lung: Secondary | ICD-10-CM

## 2017-08-11 DIAGNOSIS — Z7984 Long term (current) use of oral hypoglycemic drugs: Secondary | ICD-10-CM | POA: Insufficient documentation

## 2017-08-11 DIAGNOSIS — M549 Dorsalgia, unspecified: Secondary | ICD-10-CM | POA: Diagnosis not present

## 2017-08-11 DIAGNOSIS — Z79899 Other long term (current) drug therapy: Secondary | ICD-10-CM | POA: Diagnosis not present

## 2017-08-11 DIAGNOSIS — D508 Other iron deficiency anemias: Secondary | ICD-10-CM

## 2017-08-11 DIAGNOSIS — R0602 Shortness of breath: Secondary | ICD-10-CM | POA: Insufficient documentation

## 2017-08-11 LAB — CBC WITH DIFFERENTIAL (CANCER CENTER ONLY)
Basophils Absolute: 0 10*3/uL (ref 0.0–0.1)
Basophils Relative: 0 %
EOS ABS: 0 10*3/uL (ref 0.0–0.5)
Eosinophils Relative: 1 %
HCT: 39.7 % (ref 34.8–46.6)
Hemoglobin: 13.1 g/dL (ref 11.6–15.9)
LYMPHS ABS: 0.4 10*3/uL — AB (ref 0.9–3.3)
LYMPHS PCT: 7 %
MCH: 29 pg (ref 26.0–34.0)
MCHC: 33 g/dL (ref 32.0–36.0)
MCV: 88 fL (ref 81.0–101.0)
Monocytes Absolute: 0.4 10*3/uL (ref 0.1–0.9)
Monocytes Relative: 7 %
Neutro Abs: 5.1 10*3/uL (ref 1.5–6.5)
Neutrophils Relative %: 85 %
PLATELETS: 224 10*3/uL (ref 145–400)
RBC: 4.51 MIL/uL (ref 3.70–5.32)
RDW: 14.4 % (ref 11.1–15.7)
WBC: 6 10*3/uL (ref 3.9–10.0)

## 2017-08-11 LAB — CMP (CANCER CENTER ONLY)
ALBUMIN: 3.7 g/dL (ref 3.5–5.0)
ALK PHOS: 73 U/L (ref 26–84)
ALT: 19 U/L (ref 10–47)
AST: 21 U/L (ref 11–38)
Anion gap: 10 (ref 5–15)
BUN: 16 mg/dL (ref 7–22)
CHLORIDE: 102 mmol/L (ref 98–108)
CO2: 30 mmol/L (ref 18–33)
CREATININE: 0.6 mg/dL (ref 0.60–1.20)
Calcium: 9.9 mg/dL (ref 8.0–10.3)
GLUCOSE: 127 mg/dL — AB (ref 73–118)
Potassium: 3.6 mmol/L (ref 3.3–4.7)
SODIUM: 142 mmol/L (ref 128–145)
Total Bilirubin: 0.8 mg/dL (ref 0.2–1.6)
Total Protein: 7 g/dL (ref 6.4–8.1)

## 2017-08-11 MED ORDER — ZOLEDRONIC ACID 4 MG/5ML IV CONC
3.0000 mg | Freq: Once | INTRAVENOUS | Status: AC
  Administered 2017-08-11: 3 mg via INTRAVENOUS
  Filled 2017-08-11: qty 3.75

## 2017-08-11 NOTE — Progress Notes (Signed)
Hematology and Oncology Follow Up Visit  Samantha Quinn [id number] Jun 22, 1927 84 y.o. 08/11/2017   Principle Diagnosis:  Stage IV adenocarcinoma of the right lung-mutation negative C6-7 left nerve root compression secondary to metastatic disease - PD-L1 (0%) /TMB of 9  Past Therapy: Status post radiation/chemotherapy for recurrence-completed October 2017 Status post right upper lobectomy in March 2013 for stage IB disease XRT to the cervical spine - started 04/02/2017   Current Therapy:   Tecentriq 1200 mg IV every 3 weeks s/p cycle 8 Zometa 4 mg IV q 2 months   Interim History:  Samantha Quinn is here today with her daughter for follow-up. Her PET scan today showed improved hypermetabolic activity in the T3 vertebral body, minimally reduced activity on the right para mediastinal and perihilar opacities, no new or progressive hypermetabolic lesions in the lungs. Dr. Marin Olp was also able to go over these results with them.  She still has some neck and back pain at times. The numbness and tingling in her fingertips comes and goes. No swelling or tenderness in her extremities at this time.  She starts PT for her neck and back next week.  No fever, chills, n/v, cough, rash, dizziness, chest pain, palpitations, abdominal pain or changes in bowel or bladder habits.  Her SOB with over exertion (since her right upper lobe lung resection) is unchanged. She will take a break and rest as needed.  Since radiation she has some occasional difficulty with swallowing and is careful when eating to take smaller bites. She is hydrating well and her weight is stable.   ECOG Performance Status: 1 - Symptomatic but completely ambulatory  Medications:  Allergies as of 08/11/2017      Reactions   Tramadol Other (See Comments)   syncope   Codeine Nausea And Vomiting   Demerol Nausea Only      Medication List        Accurate as of 08/11/17  2:39 PM. Always use your most recent med list.          ALIGN  PO Take by mouth daily.   amitriptyline 10 MG tablet Commonly known as:  ELAVIL TAKE 1 TABLET BY MOUTH EVERYDAY AT BEDTIME   bisoprolol-hydrochlorothiazide 2.5-6.25 MG tablet Commonly known as:  ZIAC TAKE 1 TABLET DAILY   calcium carbonate 600 MG tablet Commonly known as:  OS-CAL Take 600 mg by mouth daily.   cholecalciferol 1000 units tablet Commonly known as:  VITAMIN D Take 2,000 Units by mouth daily.   gabapentin 100 MG capsule Commonly known as:  NEURONTIN Take 100 mg by mouth 3 (three) times daily.   glucose blood test strip Commonly known as:  ONE TOUCH ULTRA TEST 1 each by Other route 2 (two) times daily. Use to check blood sugars twice a day   HYDROcodone-homatropine 5-1.5 MG/5ML syrup Commonly known as:  HYCODAN Take 5 mLs by mouth every 6 (six) hours as needed for cough.   lovastatin 20 MG tablet Commonly known as:  MEVACOR TAKE 1 TABLET BY MOUTH EVERY OTHER DAY   metFORMIN 500 MG tablet Commonly known as:  GLUCOPHAGE TAKE 1 TABLET BY MOUTH TWICE A DAY   methimazole 5 MG tablet Commonly known as:  TAPAZOLE Take 5 mg alternating with 2.5 mg every other day   ONE TOUCH ULTRA 2 w/Device Kit Use to check blood sugars twice a day Dx E11.9   onetouch ultrasoft lancets 1 each by Other route 2 (two) times daily. Use to help check blood sugars  twice a day   ramipril 2.5 MG capsule Commonly known as:  ALTACE TAKE 1 CAPSULE BY MOUTH EVERY DAY   VISION-VITE PRESERVE PO Take 1 tablet by mouth daily.       Allergies:  Allergies  Allergen Reactions  . Tramadol Other (See Comments)    syncope  . Codeine Nausea And Vomiting  . Demerol Nausea Only    Past Medical History, Surgical history, Social history, and Family History were reviewed and updated.  Review of Systems: All other 10 point review of systems is negative.   Physical Exam:  weight is 121 lb (54.9 kg). Her oral temperature is 98.1 F (36.7 C). Her blood pressure is 154/66 (abnormal) and  her pulse is 70. Her respiration is 20 and oxygen saturation is 99%.   Wt Readings from Last 3 Encounters:  08/11/17 121 lb (54.9 kg)  06/09/17 120 lb 12.8 oz (54.8 kg)  06/03/17 120 lb 9.6 oz (54.7 kg)    Ocular: Sclerae unicteric, pupils equal, round and reactive to light Ear-nose-throat: Oropharynx clear, dentition fair Lymphatic: No cervical, supraclavicular or axillary adenopathy Lungs no rales or rhonchi, good excursion bilaterally Heart regular rate and rhythm, no murmur appreciated Abd soft, nontender, positive bowel sounds, no liver or spleen tip palpated on exam, no fluid wave  MSK no focal spinal tenderness, no joint edema Neuro: non-focal, well-oriented, appropriate affect Breasts: Deferred   Lab Results  Component Value Date   WBC 6.0 08/11/2017   HGB 11.5 (L) 04/07/2017   HCT 39.7 08/11/2017   MCV 88.0 08/11/2017   PLT 224 08/11/2017   Lab Results  Component Value Date   FERRITIN 514 (H) 04/07/2017   IRON 61 04/07/2017   TIBC 299 04/07/2017   UIBC 238 04/07/2017   IRONPCTSAT 20 (L) 04/07/2017   Lab Results  Component Value Date   RBC 4.51 08/11/2017   No results found for: KPAFRELGTCHN, LAMBDASER, KAPLAMBRATIO No results found for: IGGSERUM, IGA, IGMSERUM No results found for: Odetta Pink, SPEI   Chemistry      Component Value Date/Time   NA 142 08/11/2017 1343   NA 144 04/07/2017 1215   NA 135 (L) 03/11/2017 1416   K 3.6 08/11/2017 1343   K 4.0 04/07/2017 1215   K 3.9 03/11/2017 1416   CL 102 08/11/2017 1343   CL 103 04/07/2017 1215   CO2 30 08/11/2017 1343   CO2 29 04/07/2017 1215   CO2 23 03/11/2017 1416   BUN 16 08/11/2017 1343   BUN 13 04/07/2017 1215   BUN 16.1 03/11/2017 1416   CREATININE 0.60 08/11/2017 1343   CREATININE 0.8 04/07/2017 1215   CREATININE 0.9 03/11/2017 1416      Component Value Date/Time   CALCIUM 9.9 08/11/2017 1343   CALCIUM 9.6 04/07/2017 1215   CALCIUM 9.6  03/11/2017 1416   ALKPHOS 73 08/11/2017 1343   ALKPHOS 60 04/07/2017 1215   ALKPHOS 79 03/11/2017 1416   AST 21 08/11/2017 1343   AST 11 03/11/2017 1416   ALT 19 08/11/2017 1343   ALT 15 04/07/2017 1215   ALT 11 03/11/2017 1416   BILITOT 0.8 08/11/2017 1343   BILITOT 0.26 03/11/2017 1416      Impression and Plan: Samantha Quinn is a pleasant 84 yo caucasian female with stage IV progressive non small cell adenocarcinoma of the lung. She is doing well over all and her PET scan showed improved hypermetabolic activity in the T3 vertebral body, minimally reduced  activity on the right para mediastinal and perihilar opacities, no new or progressive hypermetabolic lesions in the lungs. We will proceed with Delton See today as planned.  We will plan to see her back in another 2 months for lab, follow-up and injection. She will have another PET scan in 4 months.  She will contact our office with any questions or concerns. We can certainly see her sooner if need be.   Laverna Peace, NP 4/17/20192:39 PM

## 2017-08-11 NOTE — Patient Instructions (Signed)

## 2017-08-17 ENCOUNTER — Ambulatory Visit: Payer: Medicare Other | Attending: Orthopedic Surgery | Admitting: Physical Therapy

## 2017-08-17 ENCOUNTER — Encounter: Payer: Self-pay | Admitting: Physical Therapy

## 2017-08-17 DIAGNOSIS — R293 Abnormal posture: Secondary | ICD-10-CM | POA: Insufficient documentation

## 2017-08-17 DIAGNOSIS — R252 Cramp and spasm: Secondary | ICD-10-CM | POA: Diagnosis not present

## 2017-08-17 DIAGNOSIS — R29898 Other symptoms and signs involving the musculoskeletal system: Secondary | ICD-10-CM | POA: Insufficient documentation

## 2017-08-17 DIAGNOSIS — M542 Cervicalgia: Secondary | ICD-10-CM | POA: Insufficient documentation

## 2017-08-17 NOTE — Patient Instructions (Signed)
AROM: Neck Rotation   Turn head slowly to look over one shoulder, then the other. Hold each position __5-10__ seconds. Repeat __10__ times per set.   Scapular Retraction (Standing)   With arms at sides, pinch shoulder blades together. Repeat _10-15___ times per set.

## 2017-08-17 NOTE — Therapy (Signed)
[facility] [address]  [place], [place], [address] Phone: [phone]   Fax:  [phone]  Physical Therapy Evaluation  Patient Details  Name: Samantha Quinn MRN: [id number] Date of Birth: March 13, 1928 Referring Provider: Dr.Dumonski   Encounter Date: 08/17/2017  PT End of Session - 08/17/17 1132    Visit Number  1    Number of Visits  8    Date for PT Re-Evaluation  09/17/17    Authorization Type  Medicare    PT Start Time  1053    PT Stop Time  1140    PT Time Calculation (min)  47 min    Activity Tolerance  Patient tolerated treatment well    Behavior During Therapy  [facility] for tasks assessed/performed       Past Medical History:  Diagnosis Date  . Anxiety   . Back pain    bulding disc  . Bronchitis    hx of-many,many years   . Diabetes mellitus   . Diabetes mellitus    type 2 and takes Metformin bid  . Dyslipidemia   . Embolism - blood clot    hx of in left lower leg and was on Coumadin for about 2months;this was about 8-62yrs ago  . Family history of adverse reaction to anesthesia    Daughter- Nausea  . History of colonic polyps   . History of radiation therapy 01/06/16-02/19/16   The primary tumor and involved mediastinal adenopathy were treated to 66 Gy in 33 fractions of 2 Gy.  [name] History of radiation therapy 04/01/17-04/26/17   cervical spine 35 Gy in 14 fractions  . HOH (hard of hearing)   . Hx of migraines    as a teenager  . Hypercholesterolemia   . Hyperlipidemia    takes Lovastatin every other day  . Hypertension    takes Ziac daily and Ramipril as well  . Hyperthyroidism   . Hypothyroidism    takes Synthroid every other day  . Insomnia    takes Elavil nightly  . Joint pain   . Lung mass   . Myalgia   . Osteoarthritis   . Osteoporosis   . Pneumothorax of right lung after biopsy 07/09/11   HAD CHEST TUBE PLACEMENT  . PONV (postoperative nausea and vomiting)   . Primary  adenocarcinoma of lower lobe of right lung ([place]) 06/24/2011   pT2a, pN0 M0 Stage 1 Well differenced Adenocarcinoma 3.5 cm resected 07/17/2011  . Primary adenocarcinoma of lung ([place]) 06/24/2011  . Shortness of breath dyspnea    With exertion  . Skin cancer    legs have dark spots on them  . Upper respiratory infection 04/2011  . Urinary incontinence    takes Detrol daily prn    Past Surgical History:  Procedure Laterality Date  . ABDOMINAL HYSTERECTOMY    . ANTERIOR CERVICAL DECOMP/DISCECTOMY FUSION N/A 02/17/2017   Procedure: ANTERIOR CERVICAL DECOMPRESSION FUSION, CERVICAL 6-7 WITH INSTRUMENTATION AND ALLOGRAFT;  Surgeon: Phylliss Bob, MD;  Location: [place];  Service: Orthopedics;  Laterality: N/A;  ANTERIOR CERVICAL DECOMPRESSION FUSION, CERVICAL 6-7 WITH INSTRUMENTATION AND ALLOGRAFT; REQUEST 2 HOURS AND FLIP   . APPENDECTOMY     as a child  . CHEST TUBE REMOVAL  07/13/11   RIGHT  CHEST TUBE  . CHOLECYSTECTOMY    . COLONOSCOPY    . COLONOSCOPY    . DILATION AND CURETTAGE OF UTERUS     x 2  . EYE SURGERY Bilateral  Cataract  . HEMORRHOID SURGERY    . LUNG BIOPSY  07/09/11   RIGHT UPER LOBE LUNG MASS   . LUNG LOBECTOMY Right 06/2011  . POSTERIOR CERVICAL FUSION/FORAMINOTOMY N/A 02/18/2017   Procedure: CERVICAL Five-Six, CERVICAL Six-Seven, CERVICAL Seven-THORACIC One, THORACIC One-Two POSTERIOR SPINAL FUSION WITH INSTRUMENTATION AND ALLOGRAFT;  Surgeon: Phylliss Bob, MD;  Location: [place];  Service: Orthopedics;  Laterality: N/A;  . RIGHT CHEST TUBE PLACEMENT  07/10/11   S/P LUNG BX  . ROTATOR CUFF REPAIR     bilateral  . TOE SURGERY  2013   right small toe  . TONSILLECTOMY     as a child  . VIDEO BRONCHOSCOPY  07/17/2011   Procedure: VIDEO BRONCHOSCOPY;  Surgeon: Grace Isaac, MD;  Location: [place] [facility];  Service: Thoracic;  Laterality: N/A;  . VIDEO BRONCHOSCOPY WITH ENDOBRONCHIAL ULTRASOUND N/A 12/10/2015   Procedure: VIDEO BRONCHOSCOPY WITH ENDOBRONCHIAL ULTRASOUND with  biopsies.;  Surgeon: Grace Isaac, MD;  Location: [facility];  Service: Thoracic;  Laterality: N/A;    There were no vitals filed for this visit.   Subjective Assessment - 08/17/17 1053    Subjective  patient s/p cervical fusion on 02/17/18 - C5-T2; now with reports general tightness and soreness. Was seen in PT last fall with worsening neck/shoulder pain - imaging revealing cancer growth at/about C7 vertebrea. patient feeling well after fusion - was in hard collar for 6 weeks, soft for 2 weeks - initially had soreness and fatigue when coming out of brace. Now doing well with all daily activities - ADLs, housework/groceries. Main complaint is tightness/soreness at neck and upper back as well as back pain that is being managed by another provider.     Pertinent History  current lung cancer, HTN, DM, cervical fusion, back pain, OA, osteoporosis    Patient Stated Goals  improve tightness/soreness    Currently in Pain?  Yes    Pain Score  3     Pain Location  Neck upper shoulders    Pain Orientation  Right;Left;Posterior    Pain Descriptors / Indicators  Sore    Pain Type  Acute pain         [facility] PT Assessment - 08/17/17 1057      Assessment   Medical Diagnosis  s/p C5-T2 PSF (02/17/17)    Referring Provider  Dr.Dumonski    Onset Date/Surgical Date  02/17/17    Next MD Visit  prn    Prior Therapy  yes - prior to surgery      Precautions   Precautions  None      Restrictions   Weight Bearing Restrictions  No      Balance Screen   Has the patient fallen in the past 6 months  No    Has the patient had a decrease in activity level because of a fear of falling?   No    Is the patient reluctant to leave their home because of a fear of falling?   No      Home Environment   Living Environment  Private residence    [place] Access  Level entry    [place]  One level      Prior Function   Level of [place]  Retired       Associate Professor   Overall Cognitive Status  Within Functional Limits for tasks assessed      Observation/Other Assessments   Focus  on Therapeutic Outcomes (FOTO)   Cervical: 61 (39% limited, predicted 38% limited)      Sensation   Light Touch  Appears Intact      Coordination   Gross Motor Movements are Fluid and Coordinated  Yes      Posture/Postural Control   Posture/Postural Control  Postural limitations    Postural Limitations  Rounded Shoulders;Forward head      ROM / Strength   AROM / PROM / Strength  AROM;Strength      AROM   AROM Assessment Site  Cervical    Cervical Flexion  15    Cervical Extension  40    Cervical - Right Side Bend  20    Cervical - Left Side Bend  22    Cervical - Right Rotation  20    Cervical - Left Rotation  20      Strength   Strength Assessment Site  Shoulder    Right/Left Shoulder  Right;Left    Right Shoulder Flexion  4-/5    Right Shoulder ABduction  4/5    Right Shoulder Internal Rotation  4/5    Right Shoulder External Rotation  4-/5    Left Shoulder Flexion  4-/5    Left Shoulder ABduction  4/5    Left Shoulder Internal Rotation  4/5    Left Shoulder External Rotation  4-/5      Palpation   Palpation comment  TTP with taut bands noted in B UT, B LS, B infra, B cervical paraspinals                Objective measurements completed on examination: See above findings.      [facility] Adult PT Treatment/Exercise - 08/17/17 1057      Modalities   Modalities  Electrical Stimulation      Electrical Stimulation   Electrical Stimulation Location  B UT to opposit shoulder    Electrical Stimulation Action  IFC     Electrical Stimulation Parameters  to tolerance    Electrical Stimulation Goals  Pain;Tone             PT Education - 08/17/17 1131    Education provided  Yes    Education Details  exam findings, POC, HEP    Person(s) Educated  Patient    Methods  Explanation;Demonstration    Comprehension  Verbalized  understanding;Returned demonstration          PT Long Term Goals - 08/17/17 1259      PT LONG TERM GOAL #1   Title  patient to be independent with advanced HEP     Status  New    Target Date  09/14/17      PT LONG TERM GOAL #2   Title  patient to improve B UE strength to >/= 4+/5    Status  New    Target Date  09/14/17      PT LONG TERM GOAL #3   Title  patient to report pain reduction by >/= 50%    Status  New    Target Date  09/14/17      PT LONG TERM GOAL #4   Title  patient to demonstrate improved tissue pliability with less TTP by >/= 50%    Status  New    Target Date  09/14/17             Plan - 08/17/17 1256    Clinical Impression Statement  Ms. Quin is a pleasant 84 y/o  female presenting to [place] today regarding primary complaints of neck and upper back soreness/stiffness s/p C5-T3 PSF on 02/17/17. Patient today with expected deficits in crevical AROM, but also presenting with UE weakness and general tightness/soreness of neck adn upper back musculature. Patient to benefit from skilled PT intervention to address pain and functional limitations to allow for improved QOL.     Clinical Presentation  Evolving    Clinical Presentation due to:  current lung cancer, HTN, DM, cervical fusion, back pain, OA, osteoporosis    Clinical Decision Making  Moderate    Rehab Potential  Good    PT Frequency  2x / week    PT Duration  4 weeks    PT Treatment/Interventions  ADLs/Self Care Home Management;Cryotherapy;Electrical Stimulation;Iontophoresis 4mg /ml Dexamethasone;Moist Heat;Therapeutic exercise;Therapeutic activities;Neuromuscular re-education;Patient/family education;Manual techniques;Taping;Dry needling;Passive range of motion    Consulted and Agree with Plan of Care  Patient       Patient will benefit from skilled therapeutic intervention in order to improve the following deficits and impairments:  Pain, Decreased strength, Decreased range of motion, Postural  dysfunction, Increased muscle spasms  Visit Diagnosis: Cervicalgia - Plan: PT plan of care cert/re-cert  Cramp and spasm - Plan: PT plan of care cert/re-cert  Other symptoms and signs involving the musculoskeletal system - Plan: PT plan of care cert/re-cert  Abnormal posture - Plan: PT plan of care cert/re-cert     Problem List Patient Active Problem List   Diagnosis Date Noted  . IDA (iron deficiency anemia) 03/12/2017  . Radiculopathy 02/17/2017  . Lung cancer metastatic to bone ([place]) 02/05/2017  . Thyrotoxicosis without thyroid storm 01/18/2017  . Bilateral hearing loss 08/27/2016  . Nasal fracture 08/27/2016  . Knee injury 08/27/2016  . Head injury 08/27/2016  . Toxic multinodular goiter w/o crisis 06/22/2016  . Fatigue 05/05/2016  . Macular degeneration 03/09/2016  . Encounter for antineoplastic chemotherapy 12/19/2015  . Meralgia paresthetica of left side 07/10/2015  . Osteoporosis 07/10/2015  . Onychomycosis 07/10/2015  . Anxiety 07/10/2015  . Diabetes ([place]) 07/10/2015  . Cough 12/12/2013  . Pneumothorax after biopsy 07/09/2011    Class: Acute  . Primary adenocarcinoma of lower lobe of right lung ([place]) 06/24/2011  . Hypercholesterolemia 10/20/2010  . Essential hypertension, benign 10/20/2010  . Osteoarthritis 10/20/2010     Lanney Gins, PT, DPT 08/17/17 1:02 PM   [facility] [address]  [place], [place], [address] Phone: [phone]   Fax:  [phone]  Name: Samantha Quinn MRN: [id number] Date of Birth: 06-Mar-1928

## 2017-08-19 ENCOUNTER — Other Ambulatory Visit: Payer: Self-pay | Admitting: *Deleted

## 2017-08-19 MED ORDER — HYDROCODONE-HOMATROPINE 5-1.5 MG/5ML PO SYRP: 5.0000 mL | ORAL_SOLUTION | Freq: Four times a day (QID) | ORAL | 0 refills | Status: DC | PRN

## 2017-08-19 MED FILL — HYDROCODONE-HOMATROPINE SOL: 5-1.5 | 6 days supply | Qty: 120 | Fill #0

## 2017-08-20 ENCOUNTER — Encounter: Payer: Self-pay | Admitting: Physical Therapy

## 2017-08-20 ENCOUNTER — Ambulatory Visit: Payer: Medicare Other | Admitting: Physical Therapy

## 2017-08-20 DIAGNOSIS — R293 Abnormal posture: Secondary | ICD-10-CM | POA: Diagnosis not present

## 2017-08-20 DIAGNOSIS — R29898 Other symptoms and signs involving the musculoskeletal system: Secondary | ICD-10-CM | POA: Diagnosis not present

## 2017-08-20 DIAGNOSIS — M542 Cervicalgia: Secondary | ICD-10-CM

## 2017-08-20 DIAGNOSIS — R252 Cramp and spasm: Secondary | ICD-10-CM | POA: Diagnosis not present

## 2017-08-20 NOTE — Therapy (Signed)
[facility] [address]  [place], [place], [address] Phone: [phone]   Fax:  [phone]  Physical Therapy Treatment  Patient Details  Name: Samantha Quinn MRN: [id number] Date of Birth: [birth year] Referring Provider: Dr.Dumonski   Encounter Date: 08/20/2017  PT End of Session - 08/20/17 0849    Visit Number  2    Number of Visits  8    Date for PT Re-Evaluation  09/17/17    Authorization Type  Medicare    PT Start Time  0846    PT Stop Time  0937    PT Time Calculation (min)  51 min    Activity Tolerance  Patient tolerated treatment well    Behavior During Therapy  [facility] for tasks assessed/performed       Past Medical History:  Diagnosis Date  . Anxiety   . Back pain    bulding disc  . Bronchitis    hx of-many,many years   . Diabetes mellitus   . Diabetes mellitus    type 2 and takes Metformin bid  . Dyslipidemia   . Embolism - blood clot    hx of in left lower leg and was on Coumadin for about 56months;this was about 8-53yrs ago  . Family history of adverse reaction to anesthesia    Daughter- Nausea  . History of colonic polyps   . History of radiation therapy 01/06/16-02/19/16   The primary tumor and involved mediastinal adenopathy were treated to 66 Gy in 33 fractions of 2 Gy.  [name] History of radiation therapy 04/01/17-04/26/17   cervical spine 35 Gy in 14 fractions  . HOH (hard of hearing)   . Hx of migraines    as a teenager  . Hypercholesterolemia   . Hyperlipidemia    takes Lovastatin every other day  . Hypertension    takes Ziac daily and Ramipril as well  . Hyperthyroidism   . Hypothyroidism    takes Synthroid every other day  . Insomnia    takes Elavil nightly  . Joint pain   . Lung mass   . Myalgia   . Osteoarthritis   . Osteoporosis   . Pneumothorax of right lung after biopsy 07/09/11   HAD CHEST TUBE PLACEMENT  . PONV (postoperative nausea and vomiting)   . Primary  adenocarcinoma of lower lobe of right lung ([place]) 06/24/2011   pT2a, pN0 M0 Stage 1 Well differenced Adenocarcinoma 3.5 cm resected 07/17/2011  . Primary adenocarcinoma of lung ([place]) 06/24/2011  . Shortness of breath dyspnea    With exertion  . Skin cancer    legs have dark spots on them  . Upper respiratory infection 04/2011  . Urinary incontinence    takes Detrol daily prn    Past Surgical History:  Procedure Laterality Date  . ABDOMINAL HYSTERECTOMY    . ANTERIOR CERVICAL DECOMP/DISCECTOMY FUSION N/A 02/17/2017   Procedure: ANTERIOR CERVICAL DECOMPRESSION FUSION, CERVICAL 6-7 WITH INSTRUMENTATION AND ALLOGRAFT;  Surgeon: Phylliss Bob, MD;  Location: [place];  Service: Orthopedics;  Laterality: N/A;  ANTERIOR CERVICAL DECOMPRESSION FUSION, CERVICAL 6-7 WITH INSTRUMENTATION AND ALLOGRAFT; REQUEST 2 HOURS AND FLIP   . APPENDECTOMY     as a child  . CHEST TUBE REMOVAL  07/13/11   RIGHT  CHEST TUBE  . CHOLECYSTECTOMY    . COLONOSCOPY    . COLONOSCOPY    . DILATION AND CURETTAGE OF UTERUS     x 2  . EYE SURGERY Bilateral  Cataract  . HEMORRHOID SURGERY    . LUNG BIOPSY  07/09/11   RIGHT UPER LOBE LUNG MASS   . LUNG LOBECTOMY Right 06/2011  . POSTERIOR CERVICAL FUSION/FORAMINOTOMY N/A 02/18/2017   Procedure: CERVICAL Five-Six, CERVICAL Six-Seven, CERVICAL Seven-THORACIC One, THORACIC One-Two POSTERIOR SPINAL FUSION WITH INSTRUMENTATION AND ALLOGRAFT;  Surgeon: Phylliss Bob, MD;  Location: [place];  Service: Orthopedics;  Laterality: N/A;  . RIGHT CHEST TUBE PLACEMENT  07/10/11   S/P LUNG BX  . ROTATOR CUFF REPAIR     bilateral  . TOE SURGERY  2013   right small toe  . TONSILLECTOMY     as a child  . VIDEO BRONCHOSCOPY  07/17/2011   Procedure: VIDEO BRONCHOSCOPY;  Surgeon: Grace Isaac, MD;  Location: [facility];  Service: Thoracic;  Laterality: N/A;  . VIDEO BRONCHOSCOPY WITH ENDOBRONCHIAL ULTRASOUND N/A 12/10/2015   Procedure: VIDEO BRONCHOSCOPY WITH ENDOBRONCHIAL ULTRASOUND with  biopsies.;  Surgeon: Grace Isaac, MD;  Location: [facility] OR;  Service: Thoracic;  Laterality: N/A;    There were no vitals filed for this visit.  Subjective Assessment - 08/20/17 0848    Subjective  feels like her arms are weak - feeling ok    Pertinent History  current lung cancer, HTN, DM, cervical fusion, back pain, OA, osteoporosis    Patient Stated Goals  improve tightness/soreness    Currently in Pain?  No/denies    Pain Score  0-No pain                       [facility] Adult PT Treatment/Exercise - 08/20/17 0850      Exercises   Exercises  Neck      Neck Exercises: Machines for Strengthening   UBE (Upper Arm Bike)  L1 x 6 min (3/3)      Neck Exercises: Theraband   Shoulder Extension  15 reps;Red    Shoulder Extension Limitations  + scap squeeze    Rows  15 reps;Red    Rows Limitations  + scap squeeze    Shoulder External Rotation  15 reps yellow tband    Shoulder External Rotation Limitations  heavy assist from PT for form    Horizontal ABduction  10 reps yellow tband    Horizontal ABduction Limitations  PT assist to maintain 90 degree forward elevation      Modalities   Modalities  Moist Heat      Moist Heat Therapy   Number Minutes Moist Heat  10 Minutes    Moist Heat Location  Cervical      Manual Therapy   Manual Therapy  Soft tissue mobilization;Myofascial release    Manual therapy comments  patient hooklying    Soft tissue mobilization  STM to B UT, B LS, B infra, B cervical paraspinals    Myofascial Release  manual trigger point release to B UT                  PT Long Term Goals - 08/20/17 0849      PT LONG TERM GOAL #1   Title  patient to be independent with advanced HEP     Status  On-going      PT LONG TERM GOAL #2   Title  patient to improve B UE strength to >/= 4+/5    Status  On-going      PT LONG TERM GOAL #3   Title  patient to report pain reduction by >/= 50%  Status  On-going      PT LONG TERM GOAL #4    Title  patient to demonstrate improved tissue pliability with less TTP by >/= 50%    Status  On-going            Plan - 08/20/17 0849    Clinical Impression Statement  Samantha Quinn doing well - PT session today focusing on manual therapy as well as beginning a strengthening progam for B shoulder and periscapular mm - did require manual assist from PT to achieve desired motion at B shoulder with horizontal abd and ER. Will continue to progress.     PT Treatment/Interventions  ADLs/Self Care Home Management;Cryotherapy;Electrical Stimulation;Iontophoresis 4mg /ml Dexamethasone;Moist Heat;Therapeutic exercise;Therapeutic activities;Neuromuscular re-education;Patient/family education;Manual techniques;Taping;Dry needling;Passive range of motion    Consulted and Agree with Plan of Care  Patient       Patient will benefit from skilled therapeutic intervention in order to improve the following deficits and impairments:  Pain, Decreased strength, Decreased range of motion, Postural dysfunction, Increased muscle spasms  Visit Diagnosis: Cervicalgia  Cramp and spasm  Other symptoms and signs involving the musculoskeletal system  Abnormal posture     Problem List Patient Active Problem List   Diagnosis Date Noted  . IDA (iron deficiency anemia) 03/12/2017  . Radiculopathy 02/17/2017  . Lung cancer metastatic to bone ([place]) 02/05/2017  . Thyrotoxicosis without thyroid storm 01/18/2017  . Bilateral hearing loss 08/27/2016  . Nasal fracture 08/27/2016  . Knee injury 08/27/2016  . Head injury 08/27/2016  . Toxic multinodular goiter w/o crisis 06/22/2016  . Fatigue 05/05/2016  . Macular degeneration 03/09/2016  . Encounter for antineoplastic chemotherapy 12/19/2015  . Meralgia paresthetica of left side 07/10/2015  . Osteoporosis 07/10/2015  . Onychomycosis 07/10/2015  . Anxiety 07/10/2015  . Diabetes ([place]) 07/10/2015  . Cough 12/12/2013  . Pneumothorax after biopsy 07/09/2011    Class:  Acute  . Primary adenocarcinoma of lower lobe of right lung ([place]) 06/24/2011  . Hypercholesterolemia 10/20/2010  . Essential hypertension, benign 10/20/2010  . Osteoarthritis 10/20/2010    Lanney Gins, PT, DPT 08/20/17 10:49 AM   [facility] [address]  [place], [place], [address] Phone: [phone]   Fax:  [phone]  Name: Samantha Quinn MRN: [id number] Date of Birth: September 29, 1927

## 2017-08-24 ENCOUNTER — Ambulatory Visit: Payer: Medicare Other | Admitting: Physical Therapy

## 2017-08-24 ENCOUNTER — Encounter: Payer: Self-pay | Admitting: Physical Therapy

## 2017-08-24 DIAGNOSIS — M542 Cervicalgia: Secondary | ICD-10-CM | POA: Diagnosis not present

## 2017-08-24 DIAGNOSIS — R252 Cramp and spasm: Secondary | ICD-10-CM

## 2017-08-24 DIAGNOSIS — R293 Abnormal posture: Secondary | ICD-10-CM

## 2017-08-24 DIAGNOSIS — R29898 Other symptoms and signs involving the musculoskeletal system: Secondary | ICD-10-CM | POA: Diagnosis not present

## 2017-08-24 NOTE — Therapy (Signed)
[facility] [address]  [place], [place], [address] Phone: [phone]   Fax:  [phone]  Physical Therapy Treatment  Patient Details  Name: Samantha Quinn MRN: [id number] Date of Birth: 04/15/1928 Referring Provider: Dr.Dumonski   Encounter Date: 08/24/2017  PT End of Session - 08/24/17 1103    Visit Number  3    Number of Visits  8    Date for PT Re-Evaluation  09/17/17    Authorization Type  Medicare    PT Start Time  1058    PT Stop Time  1137    PT Time Calculation (min)  39 min    Activity Tolerance  Patient tolerated treatment well    Behavior During Therapy  [facility] for tasks assessed/performed       Past Medical History:  Diagnosis Date  . Anxiety   . Back pain    bulding disc  . Bronchitis    hx of-many,many years   . Diabetes mellitus   . Diabetes mellitus    type 2 and takes Metformin bid  . Dyslipidemia   . Embolism - blood clot    hx of in left lower leg and was on Coumadin for about 35months;this was about 8-7yrs ago  . Family history of adverse reaction to anesthesia    Daughter- Nausea  . History of colonic polyps   . History of radiation therapy 01/06/16-02/19/16   The primary tumor and involved mediastinal adenopathy were treated to 66 Gy in 33 fractions of 2 Gy.  [name] History of radiation therapy 04/01/17-04/26/17   cervical spine 35 Gy in 14 fractions  . HOH (hard of hearing)   . Hx of migraines    as a teenager  . Hypercholesterolemia   . Hyperlipidemia    takes Lovastatin every other day  . Hypertension    takes Ziac daily and Ramipril as well  . Hyperthyroidism   . Hypothyroidism    takes Synthroid every other day  . Insomnia    takes Elavil nightly  . Joint pain   . Lung mass   . Myalgia   . Osteoarthritis   . Osteoporosis   . Pneumothorax of right lung after biopsy 07/09/11   HAD CHEST TUBE PLACEMENT  . PONV (postoperative nausea and vomiting)   . Primary  adenocarcinoma of lower lobe of right lung ([place]) 06/24/2011   pT2a, pN0 M0 Stage 1 Well differenced Adenocarcinoma 3.5 cm resected 07/17/2011  . Primary adenocarcinoma of lung ([place]) 06/24/2011  . Shortness of breath dyspnea    With exertion  . Skin cancer    legs have dark spots on them  . Upper respiratory infection 04/2011  . Urinary incontinence    takes Detrol daily prn    Past Surgical History:  Procedure Laterality Date  . ABDOMINAL HYSTERECTOMY    . ANTERIOR CERVICAL DECOMP/DISCECTOMY FUSION N/A 02/17/2017   Procedure: ANTERIOR CERVICAL DECOMPRESSION FUSION, CERVICAL 6-7 WITH INSTRUMENTATION AND ALLOGRAFT;  Surgeon: Phylliss Bob, MD;  Location: [place];  Service: Orthopedics;  Laterality: N/A;  ANTERIOR CERVICAL DECOMPRESSION FUSION, CERVICAL 6-7 WITH INSTRUMENTATION AND ALLOGRAFT; REQUEST 2 HOURS AND FLIP   . APPENDECTOMY     as a child  . CHEST TUBE REMOVAL  07/13/11   RIGHT  CHEST TUBE  . CHOLECYSTECTOMY    . COLONOSCOPY    . COLONOSCOPY    . DILATION AND CURETTAGE OF UTERUS     x 2  . EYE SURGERY Bilateral  Cataract  . HEMORRHOID SURGERY    . LUNG BIOPSY  07/09/11   RIGHT UPER LOBE LUNG MASS   . LUNG LOBECTOMY Right 06/2011  . POSTERIOR CERVICAL FUSION/FORAMINOTOMY N/A 02/18/2017   Procedure: CERVICAL Five-Six, CERVICAL Six-Seven, CERVICAL Seven-THORACIC One, THORACIC One-Two POSTERIOR SPINAL FUSION WITH INSTRUMENTATION AND ALLOGRAFT;  Surgeon: Phylliss Bob, MD;  Location: [place];  Service: Orthopedics;  Laterality: N/A;  . RIGHT CHEST TUBE PLACEMENT  07/10/11   S/P LUNG BX  . ROTATOR CUFF REPAIR     bilateral  . TOE SURGERY  2013   right small toe  . TONSILLECTOMY     as a child  . VIDEO BRONCHOSCOPY  07/17/2011   Procedure: VIDEO BRONCHOSCOPY;  Surgeon: Grace Isaac, MD;  Location: [place];  Service: Thoracic;  Laterality: N/A;  . VIDEO BRONCHOSCOPY WITH ENDOBRONCHIAL ULTRASOUND N/A 12/10/2015   Procedure: VIDEO BRONCHOSCOPY WITH ENDOBRONCHIAL ULTRASOUND with  biopsies.;  Surgeon: Grace Isaac, MD;  Location: [facility] OR;  Service: Thoracic;  Laterality: N/A;    There were no vitals filed for this visit.  Subjective Assessment - 08/24/17 1103    Subjective  feels like PT is helping    Pertinent History  current lung cancer, HTN, DM, cervical fusion, back pain, OA, osteoporosis    Patient Stated Goals  improve tightness/soreness    Currently in Pain?  No/denies    Pain Score  0-No pain                       [facility] Adult PT Treatment/Exercise - 08/24/17 0001      Neck Exercises: Machines for Strengthening   UBE (Upper Arm Bike)  L2 x 6 min (3/3)      Neck Exercises: Standing   Wall Wash  walking orange pball up wall x 12    Other Standing Exercises  B resisted tricep extension - 5# - 2 x 10; B bicep curl - red tband 2 x 10 each side    Other Standing Exercises  resisted abduction - yellow tband x 10      Neck Exercises: Seated   Shoulder Shrugs  15 reps 3#    Shoulder Rolls  Backwards;Forwards;10 reps      Neck Exercises: Land  3 reps;30 seconds mid only                  PT Long Term Goals - 08/20/17 0849      PT LONG TERM GOAL #1   Title  patient to be independent with advanced HEP     Status  On-going      PT LONG TERM GOAL #2   Title  patient to improve B UE strength to >/= 4+/5    Status  On-going      PT LONG TERM GOAL #3   Title  patient to report pain reduction by >/= 50%    Status  On-going      PT LONG TERM GOAL #4   Title  patient to demonstrate improved tissue pliability with less TTP by >/= 50%    Status  On-going            Plan - 08/24/17 1104    Clinical Impression Statement  Samantha Quinn reporting beginning to feel relief from PT sessions. Does still feel and demonstrate limitations in B UE strength and AROM. PT session focusing heavily on strengthening and ROM activities today with good tolerance. Will plan to update  HEP for more strengthening based activities  at next visit.     PT Treatment/Interventions  ADLs/Self Care Home Management;Cryotherapy;Electrical Stimulation;Iontophoresis 4mg /ml Dexamethasone;Moist Heat;Therapeutic exercise;Therapeutic activities;Neuromuscular re-education;Patient/family education;Manual techniques;Taping;Dry needling;Passive range of motion    Consulted and Agree with Plan of Care  Patient       Patient will benefit from skilled therapeutic intervention in order to improve the following deficits and impairments:  Pain, Decreased strength, Decreased range of motion, Postural dysfunction, Increased muscle spasms  Visit Diagnosis: Cervicalgia  Cramp and spasm  Other symptoms and signs involving the musculoskeletal system  Abnormal posture     Problem List Patient Active Problem List   Diagnosis Date Noted  . IDA (iron deficiency anemia) 03/12/2017  . Radiculopathy 02/17/2017  . Lung cancer metastatic to bone ([place]) 02/05/2017  . Thyrotoxicosis without thyroid storm 01/18/2017  . Bilateral hearing loss 08/27/2016  . Nasal fracture 08/27/2016  . Knee injury 08/27/2016  . Head injury 08/27/2016  . Toxic multinodular goiter w/o crisis 06/22/2016  . Fatigue 05/05/2016  . Macular degeneration 03/09/2016  . Encounter for antineoplastic chemotherapy 12/19/2015  . Meralgia paresthetica of left side 07/10/2015  . Osteoporosis 07/10/2015  . Onychomycosis 07/10/2015  . Anxiety 07/10/2015  . Diabetes ([place]) 07/10/2015  . Cough 12/12/2013  . Pneumothorax after biopsy 07/09/2011    Class: Acute  . Primary adenocarcinoma of lower lobe of right lung ([place]) 06/24/2011  . Hypercholesterolemia 10/20/2010  . Essential hypertension, benign 10/20/2010  . Osteoarthritis 10/20/2010     Lanney Gins, PT, DPT 08/24/17 2:02 PM   [facility] [address]  [place], [place], [address] Phone: [phone]   Fax:  [phone]  Name: Samantha Quinn MRN:  [id number] Date of Birth: [birth year]

## 2017-08-24 NOTE — Patient Instructions (Signed)
Pectoral Stretch    With arms behind doorjamb, gently lean forward. Stretch is felt across chest. Hold __30__ seconds. Repeat __3__ times. Do __2__ sessions per day.

## 2017-08-26 ENCOUNTER — Other Ambulatory Visit: Payer: Self-pay | Admitting: Internal Medicine

## 2017-08-27 ENCOUNTER — Encounter: Payer: Self-pay | Admitting: Physical Therapy

## 2017-08-27 ENCOUNTER — Ambulatory Visit: Payer: Medicare Other | Attending: Orthopedic Surgery | Admitting: Physical Therapy

## 2017-08-27 DIAGNOSIS — M542 Cervicalgia: Secondary | ICD-10-CM | POA: Insufficient documentation

## 2017-08-27 DIAGNOSIS — R293 Abnormal posture: Secondary | ICD-10-CM | POA: Insufficient documentation

## 2017-08-27 DIAGNOSIS — R29898 Other symptoms and signs involving the musculoskeletal system: Secondary | ICD-10-CM | POA: Diagnosis not present

## 2017-08-27 DIAGNOSIS — R252 Cramp and spasm: Secondary | ICD-10-CM | POA: Insufficient documentation

## 2017-08-27 NOTE — Therapy (Signed)
[facility] [address]  [place], [place], [address] Phone: [phone]   Fax:  [phone]  Physical Therapy Treatment  Patient Details  Name: Samantha Quinn MRN: [id number] Date of Birth: 1927/10/10 Referring Provider: Dr.Dumonski   Encounter Date: 08/27/2017  PT End of Session - 08/27/17 1019    Visit Number  4    Number of Visits  8    Date for PT Re-Evaluation  09/17/17    Authorization Type  Medicare    PT Start Time  1016    PT Stop Time  1058    PT Time Calculation (min)  42 min    Activity Tolerance  Patient tolerated treatment well    Behavior During Therapy  [facility] for tasks assessed/performed       Past Medical History:  Diagnosis Date  . Anxiety   . Back pain    bulding disc  . Bronchitis    hx of-many,many years   . Diabetes mellitus   . Diabetes mellitus    type 2 and takes Metformin bid  . Dyslipidemia   . Embolism - blood clot    hx of in left lower leg and was on Coumadin for about 26months;this was about 8-67yrs ago  . Family history of adverse reaction to anesthesia    Daughter- Nausea  . History of colonic polyps   . History of radiation therapy 01/06/16-02/19/16   The primary tumor and involved mediastinal adenopathy were treated to 66 Gy in 33 fractions of 2 Gy.  [name] History of radiation therapy 04/01/17-04/26/17   cervical spine 35 Gy in 14 fractions  . HOH (hard of hearing)   . Hx of migraines    as a teenager  . Hypercholesterolemia   . Hyperlipidemia    takes Lovastatin every other day  . Hypertension    takes Ziac daily and Ramipril as well  . Hyperthyroidism   . Hypothyroidism    takes Synthroid every other day  . Insomnia    takes Elavil nightly  . Joint pain   . Lung mass   . Myalgia   . Osteoarthritis   . Osteoporosis   . Pneumothorax of right lung after biopsy 07/09/11   HAD CHEST TUBE PLACEMENT  . PONV (postoperative nausea and vomiting)   . Primary  adenocarcinoma of lower lobe of right lung ([place]) 06/24/2011   pT2a, pN0 M0 Stage 1 Well differenced Adenocarcinoma 3.5 cm resected 07/17/2011  . Primary adenocarcinoma of lung ([place]) 06/24/2011  . Shortness of breath dyspnea    With exertion  . Skin cancer    legs have dark spots on them  . Upper respiratory infection 04/2011  . Urinary incontinence    takes Detrol daily prn    Past Surgical History:  Procedure Laterality Date  . ABDOMINAL HYSTERECTOMY    . ANTERIOR CERVICAL DECOMP/DISCECTOMY FUSION N/A 02/17/2017   Procedure: ANTERIOR CERVICAL DECOMPRESSION FUSION, CERVICAL 6-7 WITH INSTRUMENTATION AND ALLOGRAFT;  Surgeon: Phylliss Bob, MD;  Location: [place];  Service: Orthopedics;  Laterality: N/A;  ANTERIOR CERVICAL DECOMPRESSION FUSION, CERVICAL 6-7 WITH INSTRUMENTATION AND ALLOGRAFT; REQUEST 2 HOURS AND FLIP   . APPENDECTOMY     as a child  . CHEST TUBE REMOVAL  07/13/11   RIGHT  CHEST TUBE  . CHOLECYSTECTOMY    . COLONOSCOPY    . COLONOSCOPY    . DILATION AND CURETTAGE OF UTERUS     x 2  . EYE SURGERY Bilateral  Cataract  . HEMORRHOID SURGERY    . LUNG BIOPSY  07/09/11   RIGHT UPER LOBE LUNG MASS   . LUNG LOBECTOMY Right 06/2011  . POSTERIOR CERVICAL FUSION/FORAMINOTOMY N/A 02/18/2017   Procedure: CERVICAL Five-Six, CERVICAL Six-Seven, CERVICAL Seven-THORACIC One, THORACIC One-Two POSTERIOR SPINAL FUSION WITH INSTRUMENTATION AND ALLOGRAFT;  Surgeon: Phylliss Bob, MD;  Location: [place];  Service: Orthopedics;  Laterality: N/A;  . RIGHT CHEST TUBE PLACEMENT  07/10/11   S/P LUNG BX  . ROTATOR CUFF REPAIR     bilateral  . TOE SURGERY  2013   right small toe  . TONSILLECTOMY     as a child  . VIDEO BRONCHOSCOPY  07/17/2011   Procedure: VIDEO BRONCHOSCOPY;  Surgeon: Grace Isaac, MD;  Location: [place];  Service: Thoracic;  Laterality: N/A;  . VIDEO BRONCHOSCOPY WITH ENDOBRONCHIAL ULTRASOUND N/A 12/10/2015   Procedure: VIDEO BRONCHOSCOPY WITH ENDOBRONCHIAL ULTRASOUND with  biopsies.;  Surgeon: Grace Isaac, MD;  Location: [facility] OR;  Service: Thoracic;  Laterality: N/A;    There were no vitals filed for this visit.  Subjective Assessment - 08/27/17 1017    Subjective  felt well after last visit - doing well today    Pertinent History  current lung cancer, HTN, DM, cervical fusion, back pain, OA, osteoporosis    Patient Stated Goals  improve tightness/soreness    Currently in Pain?  No/denies    Pain Score  0-No pain                       [facility] Adult PT Treatment/Exercise - 08/27/17 0001      Neck Exercises: Machines for Strengthening   UBE (Upper Arm Bike)  L2 x 6 min (3/3)      Neck Exercises: Theraband   Shoulder External Rotation  10 reps yellow    Shoulder External Rotation Limitations  heavy assist from PT for form      Neck Exercises: Standing   Other Standing Exercises  B reisted bicep - thumb up x 15 yellow tband    Other Standing Exercises  resisted abduction - yellow tband x 12 - some assist from PT      Manual Therapy   Manual Therapy  Soft tissue mobilization;Myofascial release    Manual therapy comments  patient hooklying    Soft tissue mobilization  STM to B UT, B LS, B infra, B cervical paraspinals, B suboccipital mm    Myofascial Release  manualt trigger point release to R infra                  PT Long Term Goals - 08/20/17 0849      PT LONG TERM GOAL #1   Title  patient to be independent with advanced HEP     Status  On-going      PT LONG TERM GOAL #2   Title  patient to improve B UE strength to >/= 4+/5    Status  On-going      PT LONG TERM GOAL #3   Title  patient to report pain reduction by >/= 50%    Status  On-going      PT LONG TERM GOAL #4   Title  patient to demonstrate improved tissue pliability with less TTP by >/= 50%    Status  On-going            Plan - 08/27/17 1019    Clinical Impression Statement  Samantha Quinn doing well - reports  ability to perform daily activities, but  does still feel limited with strength and some general mobility at neck/shoulders. Patient still demonstrating a considerable amount of weakness with hevay assist needed from PT to complete some activities throughout desired range of motion. Will continue to progress.     PT Treatment/Interventions  ADLs/Self Care Home Management;Cryotherapy;Electrical Stimulation;Iontophoresis 4mg /ml Dexamethasone;Moist Heat;Therapeutic exercise;Therapeutic activities;Neuromuscular re-education;Patient/family education;Manual techniques;Taping;Dry needling;Passive range of motion    Consulted and Agree with Plan of Care  Patient       Patient will benefit from skilled therapeutic intervention in order to improve the following deficits and impairments:  Pain, Decreased strength, Decreased range of motion, Postural dysfunction, Increased muscle spasms  Visit Diagnosis: Cervicalgia  Cramp and spasm  Other symptoms and signs involving the musculoskeletal system  Abnormal posture     Problem List Patient Active Problem List   Diagnosis Date Noted  . IDA (iron deficiency anemia) 03/12/2017  . Radiculopathy 02/17/2017  . Lung cancer metastatic to bone ([place]) 02/05/2017  . Thyrotoxicosis without thyroid storm 01/18/2017  . Bilateral hearing loss 08/27/2016  . Nasal fracture 08/27/2016  . Knee injury 08/27/2016  . Head injury 08/27/2016  . Toxic multinodular goiter w/o crisis 06/22/2016  . Fatigue 05/05/2016  . Macular degeneration 03/09/2016  . Encounter for antineoplastic chemotherapy 12/19/2015  . Meralgia paresthetica of left side 07/10/2015  . Osteoporosis 07/10/2015  . Onychomycosis 07/10/2015  . Anxiety 07/10/2015  . Diabetes ([place]) 07/10/2015  . Cough 12/12/2013  . Pneumothorax after biopsy 07/09/2011    Class: Acute  . Primary adenocarcinoma of lower lobe of right lung ([place]) 06/24/2011  . Hypercholesterolemia 10/20/2010  . Essential hypertension, benign 10/20/2010  . Osteoarthritis  10/20/2010     Lanney Gins, PT, DPT 08/27/17 11:01 AM   [facility] [address]  [place], [place], [address] Phone: [phone]   Fax:  [phone]  Name: Samantha Quinn MRN: [id number] Date of Birth: 1927/11/26

## 2017-08-27 NOTE — Patient Instructions (Signed)
Resistive Band Rowing    With resistive band anchored in door, grasp both ends. Keeping elbows bent, pull back, squeezing shoulder blades together. Hold __5__ seconds. Repeat _15___ times. Do _2___ sessions per day.   Bicep: Reverse Curl (Eccentric), (Resistance Band)    Raise affected arm, palm down, holding resistance band until elbow is bent to 100. Turn palm up. Lower slowly 3-5 seconds. Use _____red___ resistance band. _15__ reps per set

## 2017-08-31 ENCOUNTER — Ambulatory Visit: Payer: Medicare Other | Admitting: Physical Therapy

## 2017-08-31 ENCOUNTER — Encounter: Payer: Self-pay | Admitting: Physical Therapy

## 2017-08-31 DIAGNOSIS — R29898 Other symptoms and signs involving the musculoskeletal system: Secondary | ICD-10-CM | POA: Diagnosis not present

## 2017-08-31 DIAGNOSIS — M542 Cervicalgia: Secondary | ICD-10-CM | POA: Diagnosis not present

## 2017-08-31 DIAGNOSIS — R293 Abnormal posture: Secondary | ICD-10-CM | POA: Diagnosis not present

## 2017-08-31 DIAGNOSIS — R252 Cramp and spasm: Secondary | ICD-10-CM | POA: Diagnosis not present

## 2017-08-31 NOTE — Therapy (Signed)
[facility] [address]  [place], [place], [address] Phone: [phone]   Fax:  [phone]  Physical Therapy Treatment  Patient Details  Name: Samantha Quinn MRN: [id number] Date of Birth: 09/18/1927 Referring Provider: Dr.Dumonski   Encounter Date: 08/31/2017  PT End of Session - 08/31/17 1020    Visit Number  5    Number of Visits  8    Date for PT Re-Evaluation  09/17/17    Authorization Type  Medicare    PT Start Time  2671    PT Stop Time  1056    PT Time Calculation (min)  41 min    Activity Tolerance  Patient tolerated treatment well    Behavior During Therapy  [facility] for tasks assessed/performed       Past Medical History:  Diagnosis Date  . Anxiety   . Back pain    bulding disc  . Bronchitis    hx of-many,many years   . Diabetes mellitus   . Diabetes mellitus    type 2 and takes Metformin bid  . Dyslipidemia   . Embolism - blood clot    hx of in left lower leg and was on Coumadin for about 31months;this was about 8-48yrs ago  . Family history of adverse reaction to anesthesia    Daughter- Nausea  . History of colonic polyps   . History of radiation therapy 01/06/16-02/19/16   The primary tumor and involved mediastinal adenopathy were treated to 66 Gy in 33 fractions of 2 Gy.  [name] History of radiation therapy 04/01/17-04/26/17   cervical spine 35 Gy in 14 fractions  . HOH (hard of hearing)   . Hx of migraines    as a teenager  . Hypercholesterolemia   . Hyperlipidemia    takes Lovastatin every other day  . Hypertension    takes Ziac daily and Ramipril as well  . Hyperthyroidism   . Hypothyroidism    takes Synthroid every other day  . Insomnia    takes Elavil nightly  . Joint pain   . Lung mass   . Myalgia   . Osteoarthritis   . Osteoporosis   . Pneumothorax of right lung after biopsy 07/09/11   HAD CHEST TUBE PLACEMENT  . PONV (postoperative nausea and vomiting)   . Primary  adenocarcinoma of lower lobe of right lung ([place]) 06/24/2011   pT2a, pN0 M0 Stage 1 Well differenced Adenocarcinoma 3.5 cm resected 07/17/2011  . Primary adenocarcinoma of lung ([place]) 06/24/2011  . Shortness of breath dyspnea    With exertion  . Skin cancer    legs have dark spots on them  . Upper respiratory infection 04/2011  . Urinary incontinence    takes Detrol daily prn    Past Surgical History:  Procedure Laterality Date  . ABDOMINAL HYSTERECTOMY    . ANTERIOR CERVICAL DECOMP/DISCECTOMY FUSION N/A 02/17/2017   Procedure: ANTERIOR CERVICAL DECOMPRESSION FUSION, CERVICAL 6-7 WITH INSTRUMENTATION AND ALLOGRAFT;  Surgeon: Phylliss Bob, MD;  Location: [place];  Service: Orthopedics;  Laterality: N/A;  ANTERIOR CERVICAL DECOMPRESSION FUSION, CERVICAL 6-7 WITH INSTRUMENTATION AND ALLOGRAFT; REQUEST 2 HOURS AND FLIP   . APPENDECTOMY     as a child  . CHEST TUBE REMOVAL  07/13/11   RIGHT  CHEST TUBE  . CHOLECYSTECTOMY    . COLONOSCOPY    . COLONOSCOPY    . DILATION AND CURETTAGE OF UTERUS     x 2  . EYE SURGERY Bilateral  Cataract  . HEMORRHOID SURGERY    . LUNG BIOPSY  07/09/11   RIGHT UPER LOBE LUNG MASS   . LUNG LOBECTOMY Right 06/2011  . POSTERIOR CERVICAL FUSION/FORAMINOTOMY N/A 02/18/2017   Procedure: CERVICAL Five-Six, CERVICAL Six-Seven, CERVICAL Seven-THORACIC One, THORACIC One-Two POSTERIOR SPINAL FUSION WITH INSTRUMENTATION AND ALLOGRAFT;  Surgeon: Phylliss Bob, MD;  Location: [place];  Service: Orthopedics;  Laterality: N/A;  . RIGHT CHEST TUBE PLACEMENT  07/10/11   S/P LUNG BX  . ROTATOR CUFF REPAIR     bilateral  . TOE SURGERY  2013   right small toe  . TONSILLECTOMY     as a child  . VIDEO BRONCHOSCOPY  07/17/2011   Procedure: VIDEO BRONCHOSCOPY;  Surgeon: Grace Isaac, MD;  Location: [place];  Service: Thoracic;  Laterality: N/A;  . VIDEO BRONCHOSCOPY WITH ENDOBRONCHIAL ULTRASOUND N/A 12/10/2015   Procedure: VIDEO BRONCHOSCOPY WITH ENDOBRONCHIAL ULTRASOUND with  biopsies.;  Surgeon: Grace Isaac, MD;  Location: [facility] OR;  Service: Thoracic;  Laterality: N/A;    There were no vitals filed for this visit.  Subjective Assessment - 08/31/17 1018    Subjective  doing well - limited compliance with HEP; but has been working in te yard - gardening    Pertinent History  current lung cancer, HTN, DM, cervical fusion, back pain, OA, osteoporosis    Patient Stated Goals  improve tightness/soreness    Currently in Pain?  No/denies    Pain Score  0-No pain                       [facility] Adult PT Treatment/Exercise - 08/31/17 0001      Neck Exercises: Machines for Strengthening   UBE (Upper Arm Bike)  L2 x 6 min (3/3)      Neck Exercises: Theraband   Other Theraband Exercises  resisted wall clocks - yellow tband x 5 reps each side      Neck Exercises: Standing   Wall Wash  walking orange pball up wall x 12 - 2# at B wrist    Other Standing Exercises  B resisted tricep extension - 10# 2 x 10 reps    Other Standing Exercises  upright row - 2# B UE - 2 x 10 B shoulder scaption - 1# B UE x 12      Neck Exercises: Seated   Neck Retraction  -- 12 reps; 5 sec    Shoulder Rolls  Backwards;Forwards;10 reps    Other Seated Exercise  scap retraction - 10 x 5 sec hold                  PT Long Term Goals - 08/20/17 0849      PT LONG TERM GOAL #1   Title  patient to be independent with advanced HEP     Status  On-going      PT LONG TERM GOAL #2   Title  patient to improve B UE strength to >/= 4+/5    Status  On-going      PT LONG TERM GOAL #3   Title  patient to report pain reduction by >/= 50%    Status  On-going      PT LONG TERM GOAL #4   Title  patient to demonstrate improved tissue pliability with less TTP by >/= 50%    Status  On-going            Plan - 08/31/17 1020    Clinical  Impression Statement  Patient progressing well with all strengthening tasks. Does continue to demonstrate significant strength  limitations - likely due in part to prior limitations at B shoulder, but largely in part to disuse following surgery, however, making continuous progress in PT sessions. Will continue to progress towards goals as tolerated.     PT Treatment/Interventions  ADLs/Self Care Home Management;Cryotherapy;Electrical Stimulation;Iontophoresis 4mg /ml Dexamethasone;Moist Heat;Therapeutic exercise;Therapeutic activities;Neuromuscular re-education;Patient/family education;Manual techniques;Taping;Dry needling;Passive range of motion    Consulted and Agree with Plan of Care  Patient       Patient will benefit from skilled therapeutic intervention in order to improve the following deficits and impairments:  Pain, Decreased strength, Decreased range of motion, Postural dysfunction, Increased muscle spasms  Visit Diagnosis: Cervicalgia  Cramp and spasm  Other symptoms and signs involving the musculoskeletal system  Abnormal posture     Problem List Patient Active Problem List   Diagnosis Date Noted  . IDA (iron deficiency anemia) 03/12/2017  . Radiculopathy 02/17/2017  . Lung cancer metastatic to bone ([place]) 02/05/2017  . Thyrotoxicosis without thyroid storm 01/18/2017  . Bilateral hearing loss 08/27/2016  . Nasal fracture 08/27/2016  . Knee injury 08/27/2016  . Head injury 08/27/2016  . Toxic multinodular goiter w/o crisis 06/22/2016  . Fatigue 05/05/2016  . Macular degeneration 03/09/2016  . Encounter for antineoplastic chemotherapy 12/19/2015  . Meralgia paresthetica of left side 07/10/2015  . Osteoporosis 07/10/2015  . Onychomycosis 07/10/2015  . Anxiety 07/10/2015  . Diabetes ([place]) 07/10/2015  . Cough 12/12/2013  . Pneumothorax after biopsy 07/09/2011    Class: Acute  . Primary adenocarcinoma of lower lobe of right lung ([place]) 06/24/2011  . Hypercholesterolemia 10/20/2010  . Essential hypertension, benign 10/20/2010  . Osteoarthritis 10/20/2010    Lanney Gins, PT,  DPT 08/31/17 11:08 AM   [facility] [address]  [place], [place], [address] Phone: [phone]   Fax:  [phone]  Name: Samantha Quinn MRN: [id number] Date of Birth: December 24, 1927

## 2017-09-03 ENCOUNTER — Ambulatory Visit: Payer: Medicare Other | Admitting: Physical Therapy

## 2017-09-03 ENCOUNTER — Encounter: Payer: Self-pay | Admitting: Physical Therapy

## 2017-09-03 DIAGNOSIS — R293 Abnormal posture: Secondary | ICD-10-CM | POA: Diagnosis not present

## 2017-09-03 DIAGNOSIS — R252 Cramp and spasm: Secondary | ICD-10-CM | POA: Diagnosis not present

## 2017-09-03 DIAGNOSIS — M542 Cervicalgia: Secondary | ICD-10-CM | POA: Diagnosis not present

## 2017-09-03 DIAGNOSIS — R29898 Other symptoms and signs involving the musculoskeletal system: Secondary | ICD-10-CM | POA: Diagnosis not present

## 2017-09-03 NOTE — Therapy (Signed)
[facility] [address]  [place], [place], [address] Phone: [phone]   Fax:  [phone]  Physical Therapy Treatment  Patient Details  Name: Samantha Quinn MRN: [id number] Date of Birth: 1928/02/26 Referring Provider: Dr.Dumonski   Encounter Date: 09/03/2017  PT End of Session - 09/03/17 1059    Visit Number  6    Number of Visits  8    Date for PT Re-Evaluation  09/17/17    Authorization Type  Medicare    PT Start Time  1057    PT Stop Time  1137    PT Time Calculation (min)  40 min    Activity Tolerance  Patient tolerated treatment well    Behavior During Therapy  [facility] for tasks assessed/performed       Past Medical History:  Diagnosis Date  . Anxiety   . Back pain    bulding disc  . Bronchitis    hx of-many,many years   . Diabetes mellitus   . Diabetes mellitus    type 2 and takes Metformin bid  . Dyslipidemia   . Embolism - blood clot    hx of in left lower leg and was on Coumadin for about 22months;this was about 8-3yrs ago  . Family history of adverse reaction to anesthesia    Daughter- Nausea  . History of colonic polyps   . History of radiation therapy 01/06/16-02/19/16   The primary tumor and involved mediastinal adenopathy were treated to 66 Gy in 33 fractions of 2 Gy.  [name] History of radiation therapy 04/01/17-04/26/17   cervical spine 35 Gy in 14 fractions  . HOH (hard of hearing)   . Hx of migraines    as a teenager  . Hypercholesterolemia   . Hyperlipidemia    takes Lovastatin every other day  . Hypertension    takes Ziac daily and Ramipril as well  . Hyperthyroidism   . Hypothyroidism    takes Synthroid every other day  . Insomnia    takes Elavil nightly  . Joint pain   . Lung mass   . Myalgia   . Osteoarthritis   . Osteoporosis   . Pneumothorax of right lung after biopsy 07/09/11   HAD CHEST TUBE PLACEMENT  . PONV (postoperative nausea and vomiting)   . Primary  adenocarcinoma of lower lobe of right lung ([place]) 06/24/2011   pT2a, pN0 M0 Stage 1 Well differenced Adenocarcinoma 3.5 cm resected 07/17/2011  . Primary adenocarcinoma of lung ([place]) 06/24/2011  . Shortness of breath dyspnea    With exertion  . Skin cancer    legs have dark spots on them  . Upper respiratory infection 04/2011  . Urinary incontinence    takes Detrol daily prn    Past Surgical History:  Procedure Laterality Date  . ABDOMINAL HYSTERECTOMY    . ANTERIOR CERVICAL DECOMP/DISCECTOMY FUSION N/A 02/17/2017   Procedure: ANTERIOR CERVICAL DECOMPRESSION FUSION, CERVICAL 6-7 WITH INSTRUMENTATION AND ALLOGRAFT;  Surgeon: Phylliss Bob, MD;  Location: [place];  Service: Orthopedics;  Laterality: N/A;  ANTERIOR CERVICAL DECOMPRESSION FUSION, CERVICAL 6-7 WITH INSTRUMENTATION AND ALLOGRAFT; REQUEST 2 HOURS AND FLIP   . APPENDECTOMY     as a child  . CHEST TUBE REMOVAL  07/13/11   RIGHT  CHEST TUBE  . CHOLECYSTECTOMY    . COLONOSCOPY    . COLONOSCOPY    . DILATION AND CURETTAGE OF UTERUS     x 2  . EYE SURGERY Bilateral  Cataract  . HEMORRHOID SURGERY    . LUNG BIOPSY  07/09/11   RIGHT UPER LOBE LUNG MASS   . LUNG LOBECTOMY Right 06/2011  . POSTERIOR CERVICAL FUSION/FORAMINOTOMY N/A 02/18/2017   Procedure: CERVICAL Five-Six, CERVICAL Six-Seven, CERVICAL Seven-THORACIC One, THORACIC One-Two POSTERIOR SPINAL FUSION WITH INSTRUMENTATION AND ALLOGRAFT;  Surgeon: Phylliss Bob, MD;  Location: [place];  Service: Orthopedics;  Laterality: N/A;  . RIGHT CHEST TUBE PLACEMENT  07/10/11   S/P LUNG BX  . ROTATOR CUFF REPAIR     bilateral  . TOE SURGERY  2013   right small toe  . TONSILLECTOMY     as a child  . VIDEO BRONCHOSCOPY  07/17/2011   Procedure: VIDEO BRONCHOSCOPY;  Surgeon: Grace Isaac, MD;  Location: [place];  Service: Thoracic;  Laterality: N/A;  . VIDEO BRONCHOSCOPY WITH ENDOBRONCHIAL ULTRASOUND N/A 12/10/2015   Procedure: VIDEO BRONCHOSCOPY WITH ENDOBRONCHIAL ULTRASOUND with  biopsies.;  Surgeon: Grace Isaac, MD;  Location: [facility] OR;  Service: Thoracic;  Laterality: N/A;    There were no vitals filed for this visit.  Subjective Assessment - 09/03/17 1059    Subjective  doing ewll - plans to garden today - feels most limited due to low back pain    Pertinent History  current lung cancer, HTN, DM, cervical fusion, back pain, OA, osteoporosis    Patient Stated Goals  improve tightness/soreness    Currently in Pain?  No/denies    Pain Score  0-No pain                       [facility] Adult PT Treatment/Exercise - 09/03/17 1100      Neck Exercises: Machines for Strengthening   UBE (Upper Arm Bike)  L2 x 6 min (3/3)      Neck Exercises: Theraband   Rows  15 reps;Red    Rows Limitations  + scap squeeze - seated      Neck Exercises: Standing   Other Standing Exercises  cabinet reaches - flexion x 10 with 1#; abduction x 10 with 1#      Neck Exercises: Seated   Other Seated Exercise  seated on orange pball - hip flexion x 10, LAQ x 10      Manual Therapy   Manual Therapy  Soft tissue mobilization    Manual therapy comments  patient hooklying    Soft tissue mobilization  STM to B UT, B infra, B LS, B cervical paraspinals, B suboccipital mm.                   PT Long Term Goals - 08/20/17 0849      PT LONG TERM GOAL #1   Title  patient to be independent with advanced HEP     Status  On-going      PT LONG TERM GOAL #2   Title  patient to improve B UE strength to >/= 4+/5    Status  On-going      PT LONG TERM GOAL #3   Title  patient to report pain reduction by >/= 50%    Status  On-going      PT LONG TERM GOAL #4   Title  patient to demonstrate improved tissue pliability with less TTP by >/= 50%    Status  On-going            Plan - 09/03/17 1059    Clinical Impression Statement  Samantha Quinn feeling well - good improvements in  soft tissue quality at neck and upper back with much less TTP as well as taut bands and noted  tissue tightness on first visit. Patient doing well with upper body strengthening, with some limitations from prior RTC repairs. Making good progress towards goals.     PT Treatment/Interventions  ADLs/Self Care Home Management;Cryotherapy;Electrical Stimulation;Iontophoresis 4mg /ml Dexamethasone;Moist Heat;Therapeutic exercise;Therapeutic activities;Neuromuscular re-education;Patient/family education;Manual techniques;Taping;Dry needling;Passive range of motion    Consulted and Agree with Plan of Care  Patient       Patient will benefit from skilled therapeutic intervention in order to improve the following deficits and impairments:  Pain, Decreased strength, Decreased range of motion, Postural dysfunction, Increased muscle spasms  Visit Diagnosis: Cervicalgia  Cramp and spasm  Other symptoms and signs involving the musculoskeletal system  Abnormal posture     Problem List Patient Active Problem List   Diagnosis Date Noted  . IDA (iron deficiency anemia) 03/12/2017  . Radiculopathy 02/17/2017  . Lung cancer metastatic to bone ([place]) 02/05/2017  . Thyrotoxicosis without thyroid storm 01/18/2017  . Bilateral hearing loss 08/27/2016  . Nasal fracture 08/27/2016  . Knee injury 08/27/2016  . Head injury 08/27/2016  . Toxic multinodular goiter w/o crisis 06/22/2016  . Fatigue 05/05/2016  . Macular degeneration 03/09/2016  . Encounter for antineoplastic chemotherapy 12/19/2015  . Meralgia paresthetica of left side 07/10/2015  . Osteoporosis 07/10/2015  . Onychomycosis 07/10/2015  . Anxiety 07/10/2015  . Diabetes ([place]) 07/10/2015  . Cough 12/12/2013  . Pneumothorax after biopsy 07/09/2011    Class: Acute  . Primary adenocarcinoma of lower lobe of right lung ([place]) 06/24/2011  . Hypercholesterolemia 10/20/2010  . Essential hypertension, benign 10/20/2010  . Osteoarthritis 10/20/2010     Lanney Gins, PT, DPT 09/03/17 11:56 AM   [facility] [address]  [place], [place], [address] Phone: [phone]   Fax:  [phone]  Name: AMELIYA NICOTRA MRN: [id number] Date of Birth: 25-Dec-1927

## 2017-09-07 ENCOUNTER — Encounter: Payer: Self-pay | Admitting: Physical Therapy

## 2017-09-07 ENCOUNTER — Ambulatory Visit: Payer: Medicare Other | Admitting: Physical Therapy

## 2017-09-07 DIAGNOSIS — M542 Cervicalgia: Secondary | ICD-10-CM | POA: Diagnosis not present

## 2017-09-07 DIAGNOSIS — R252 Cramp and spasm: Secondary | ICD-10-CM

## 2017-09-07 DIAGNOSIS — R293 Abnormal posture: Secondary | ICD-10-CM | POA: Diagnosis not present

## 2017-09-07 DIAGNOSIS — R29898 Other symptoms and signs involving the musculoskeletal system: Secondary | ICD-10-CM

## 2017-09-07 NOTE — Therapy (Signed)
[facility] [address]  [place], [place], [address] Phone: [phone]   Fax:  [phone]  Physical Therapy Treatment  Patient Details  Name: Samantha Quinn MRN: [id number] Date of Birth: October 31, 1927 Referring Provider: Dr.Dumonski   Encounter Date: 09/07/2017  PT End of Session - 09/07/17 1103    Visit Number  7    Number of Visits  8    Date for PT Re-Evaluation  09/17/17    Authorization Type  Medicare    PT Start Time  1101    PT Stop Time  1144    PT Time Calculation (min)  43 min    Activity Tolerance  Patient tolerated treatment well    Behavior During Therapy  [facility] for tasks assessed/performed       Past Medical History:  Diagnosis Date  . Anxiety   . Back pain    bulding disc  . Bronchitis    hx of-many,many years   . Diabetes mellitus   . Diabetes mellitus    type 2 and takes Metformin bid  . Dyslipidemia   . Embolism - blood clot    hx of in left lower leg and was on Coumadin for about 82months;this was about 8-70yrs ago  . Family history of adverse reaction to anesthesia    Daughter- Nausea  . History of colonic polyps   . History of radiation therapy 01/06/16-02/19/16   The primary tumor and involved mediastinal adenopathy were treated to 66 Gy in 33 fractions of 2 Gy.  [name] History of radiation therapy 04/01/17-04/26/17   cervical spine 35 Gy in 14 fractions  . HOH (hard of hearing)   . Hx of migraines    as a teenager  . Hypercholesterolemia   . Hyperlipidemia    takes Lovastatin every other day  . Hypertension    takes Ziac daily and Ramipril as well  . Hyperthyroidism   . Hypothyroidism    takes Synthroid every other day  . Insomnia    takes Elavil nightly  . Joint pain   . Lung mass   . Myalgia   . Osteoarthritis   . Osteoporosis   . Pneumothorax of right lung after biopsy 07/09/11   HAD CHEST TUBE PLACEMENT  . PONV (postoperative nausea and vomiting)   . Primary  adenocarcinoma of lower lobe of right lung ([place]) 06/24/2011   pT2a, pN0 M0 Stage 1 Well differenced Adenocarcinoma 3.5 cm resected 07/17/2011  . Primary adenocarcinoma of lung ([place]) 06/24/2011  . Shortness of breath dyspnea    With exertion  . Skin cancer    legs have dark spots on them  . Upper respiratory infection 04/2011  . Urinary incontinence    takes Detrol daily prn    Past Surgical History:  Procedure Laterality Date  . ABDOMINAL HYSTERECTOMY    . ANTERIOR CERVICAL DECOMP/DISCECTOMY FUSION N/A 02/17/2017   Procedure: ANTERIOR CERVICAL DECOMPRESSION FUSION, CERVICAL 6-7 WITH INSTRUMENTATION AND ALLOGRAFT;  Surgeon: Phylliss Bob, MD;  Location: [place];  Service: Orthopedics;  Laterality: N/A;  ANTERIOR CERVICAL DECOMPRESSION FUSION, CERVICAL 6-7 WITH INSTRUMENTATION AND ALLOGRAFT; REQUEST 2 HOURS AND FLIP   . APPENDECTOMY     as a child  . CHEST TUBE REMOVAL  07/13/11   RIGHT  CHEST TUBE  . CHOLECYSTECTOMY    . COLONOSCOPY    . COLONOSCOPY    . DILATION AND CURETTAGE OF UTERUS     x 2  . EYE SURGERY Bilateral  Cataract  . HEMORRHOID SURGERY    . LUNG BIOPSY  07/09/11   RIGHT UPER LOBE LUNG MASS   . LUNG LOBECTOMY Right 06/2011  . POSTERIOR CERVICAL FUSION/FORAMINOTOMY N/A 02/18/2017   Procedure: CERVICAL Five-Six, CERVICAL Six-Seven, CERVICAL Seven-THORACIC One, THORACIC One-Two POSTERIOR SPINAL FUSION WITH INSTRUMENTATION AND ALLOGRAFT;  Surgeon: Phylliss Bob, MD;  Location: [place];  Service: Orthopedics;  Laterality: N/A;  . RIGHT CHEST TUBE PLACEMENT  07/10/11   S/P LUNG BX  . ROTATOR CUFF REPAIR     bilateral  . TOE SURGERY  2013   right small toe  . TONSILLECTOMY     as a child  . VIDEO BRONCHOSCOPY  07/17/2011   Procedure: VIDEO BRONCHOSCOPY;  Surgeon: Grace Isaac, MD;  Location: [place];  Service: Thoracic;  Laterality: N/A;  . VIDEO BRONCHOSCOPY WITH ENDOBRONCHIAL ULTRASOUND N/A 12/10/2015   Procedure: VIDEO BRONCHOSCOPY WITH ENDOBRONCHIAL ULTRASOUND with  biopsies.;  Surgeon: Grace Isaac, MD;  Location[facility];  Service: Thoracic;  Laterality: N/A;    There were no vitals filed for this visit.  Subjective Assessment - 09/07/17 1102    Subjective  doing well - no new issues/complaints    Pertinent History  current lung cancer, HTN, DM, cervical fusion, back pain, OA, osteoporosis    Patient Stated Goals  improve tightness/soreness    Currently in Pain?  No/denies    Pain Score  0-No pain                       [facility] Adult PT Treatment/Exercise - 09/07/17 0001      Neck Exercises: Machines for Strengthening   UBE (Upper Arm Bike)  L3 x 6 min (3/3)      Neck Exercises: Theraband   Shoulder External Rotation  15 reps;Red      Neck Exercises: Standing   Other Standing Exercises  B resisted tricep extension - 10# 2 x 10 reps    Other Standing Exercises  cabinet reaches - flexion x 12 with 1#; abduction x 8 with 1#      Neck Exercises: Seated   Neck Retraction  10 reps;5 secs    Shoulder Shrugs  15 reps    Other Seated Exercise  scap retraction - 10 x 5 sec hold                  PT Long Term Goals - 08/20/17 0849      PT LONG TERM GOAL #1   Title  patient to be independent with advanced HEP     Status  On-going      PT LONG TERM GOAL #2   Title  patient to improve B UE strength to >/= 4+/5    Status  On-going      PT LONG TERM GOAL #3   Title  patient to report pain reduction by >/= 50%    Status  On-going      PT LONG TERM GOAL #4   Title  patient to demonstrate improved tissue pliability with less TTP by >/= 50%    Status  On-going            Plan - 09/07/17 1103    Clinical Impression Statement  Patient doing well - reports ability to garden without issue. Patient also with subjective reports of soreness/discomfort relief at neck and upper back since starting PT. WIll plan to review goals and HEP at next visit to prepare for discharge.  PT Treatment/Interventions  ADLs/Self Care  Home Management;Cryotherapy;Electrical Stimulation;Iontophoresis 4mg /ml Dexamethasone;Moist Heat;Therapeutic exercise;Therapeutic activities;Neuromuscular re-education;Patient/family education;Manual techniques;Taping;Dry needling;Passive range of motion    Consulted and Agree with Plan of Care  Patient       Patient will benefit from skilled therapeutic intervention in order to improve the following deficits and impairments:  Pain, Decreased strength, Decreased range of motion, Postural dysfunction, Increased muscle spasms  Visit Diagnosis: Cervicalgia  Cramp and spasm  Other symptoms and signs involving the musculoskeletal system  Abnormal posture     Problem List Patient Active Problem List   Diagnosis Date Noted  . IDA (iron deficiency anemia) 03/12/2017  . Radiculopathy 02/17/2017  . Lung cancer metastatic to bone ([place]) 02/05/2017  . Thyrotoxicosis without thyroid storm 01/18/2017  . Bilateral hearing loss 08/27/2016  . Nasal fracture 08/27/2016  . Knee injury 08/27/2016  . Head injury 08/27/2016  . Toxic multinodular goiter w/o crisis 06/22/2016  . Fatigue 05/05/2016  . Macular degeneration 03/09/2016  . Encounter for antineoplastic chemotherapy 12/19/2015  . Meralgia paresthetica of left side 07/10/2015  . Osteoporosis 07/10/2015  . Onychomycosis 07/10/2015  . Anxiety 07/10/2015  . Diabetes ([place]) 07/10/2015  . Cough 12/12/2013  . Pneumothorax after biopsy 07/09/2011    Class: Acute  . Primary adenocarcinoma of lower lobe of right lung ([place]) 06/24/2011  . Hypercholesterolemia 10/20/2010  . Essential hypertension, benign 10/20/2010  . Osteoarthritis 10/20/2010     Lanney Gins, PT, DPT 09/07/17 12:24 PM   [facility] [address]  [place], [place], [address] Phone: [phone]   Fax:  [phone]  Name: Samantha Quinn MRN: [id number] Date of Birth: 05-03-1927

## 2017-09-08 ENCOUNTER — Telehealth: Payer: Self-pay | Admitting: Emergency Medicine

## 2017-09-08 DIAGNOSIS — H9190 Unspecified hearing loss, unspecified ear: Secondary | ICD-10-CM

## 2017-09-08 NOTE — Telephone Encounter (Signed)
orderd

## 2017-09-08 NOTE — Telephone Encounter (Signed)
Spoke with pt to inform.  

## 2017-09-08 NOTE — Telephone Encounter (Signed)
Copied from [place]. Topic: Referral - Request >> Sep 08, 2017 10:17 AM Aurelio Brash B wrote: Reason for CRM:     Pt is requesting referral to  Dr Marygrace Drought  for a hearing test @ [facility]  speech and hear center

## 2017-09-10 ENCOUNTER — Encounter: Payer: Self-pay | Admitting: Physical Therapy

## 2017-09-10 ENCOUNTER — Ambulatory Visit: Payer: Medicare Other | Admitting: Physical Therapy

## 2017-09-10 DIAGNOSIS — M542 Cervicalgia: Secondary | ICD-10-CM | POA: Diagnosis not present

## 2017-09-10 DIAGNOSIS — R29898 Other symptoms and signs involving the musculoskeletal system: Secondary | ICD-10-CM | POA: Diagnosis not present

## 2017-09-10 DIAGNOSIS — R293 Abnormal posture: Secondary | ICD-10-CM

## 2017-09-10 DIAGNOSIS — R252 Cramp and spasm: Secondary | ICD-10-CM

## 2017-09-10 NOTE — Therapy (Signed)
[facility] [address]  [place], [place], [address] Phone: [phone]   Fax:  [phone]  Physical Therapy Treatment  Patient Details  Name: Samantha Quinn MRN: [id number] Date of Birth: 1927-07-14 Referring Provider: Dr.Dumonski   Encounter Date: 09/10/2017  PT End of Session - 09/10/17 1103    Visit Number  8    Number of Visits  8    Date for PT Re-Evaluation  09/17/17    Authorization Type  Medicare    PT Start Time  1100    PT Stop Time  1125    PT Time Calculation (min)  25 min    Activity Tolerance  Patient tolerated treatment well    Behavior During Therapy  Samantha Quinn for tasks assessed/performed       Past Medical History:  Diagnosis Date  . Anxiety   . Back pain    bulding disc  . Bronchitis    hx of-many,many years   . Diabetes mellitus   . Diabetes mellitus    type 2 and takes Metformin bid  . Dyslipidemia   . Embolism - blood clot    hx of in left lower leg and was on Coumadin for about 81month;this was about 8-13yrago  . Family history of adverse reaction to anesthesia    Daughter- Nausea  . History of colonic polyps   . History of radiation therapy 01/06/16-02/19/16   The primary tumor and involved mediastinal adenopathy were treated to 66 Gy in 33 fractions of 2 Gy.  . Marland Kitchenistory of radiation therapy 04/01/17-04/26/17   cervical spine 35 Gy in 14 fractions  . HOH (hard of hearing)   . Hx of migraines    as a teenager  . Hypercholesterolemia   . Hyperlipidemia    takes Lovastatin every other day  . Hypertension    takes Ziac daily and Ramipril as well  . Hyperthyroidism   . Hypothyroidism    takes Synthroid every other day  . Insomnia    takes Elavil nightly  . Joint pain   . Lung mass   . Myalgia   . Osteoarthritis   . Osteoporosis   . Pneumothorax of right lung after biopsy 07/09/11   HAD CHEST TUBE PLACEMENT  . PONV (postoperative nausea and vomiting)   . Primary  adenocarcinoma of lower lobe of right lung ([facility]2/27/2013   pT2a, pN0 M0 Stage 1 Well differenced Adenocarcinoma 3.5 cm resected 07/17/2011  . Primary adenocarcinoma of lung ([facility]2/27/2013  . Shortness of breath dyspnea    With exertion  . Skin cancer    legs have dark spots on them  . Upper respiratory infection 04/2011  . Urinary incontinence    takes Detrol daily prn    Past Surgical History:  Procedure Laterality Date  . ABDOMINAL HYSTERECTOMY    . ANTERIOR CERVICAL DECOMP/DISCECTOMY FUSION N/A 02/17/2017   Procedure: ANTERIOR CERVICAL DECOMPRESSION FUSION, CERVICAL 6-7 WITH INSTRUMENTATION AND ALLOGRAFT;  Surgeon: Samantha BobMD;  [facility] Service: Orthopedics;  Laterality: N/A;  ANTERIOR CERVICAL DECOMPRESSION FUSION, CERVICAL 6-7 WITH INSTRUMENTATION AND ALLOGRAFT; REQUEST 2 HOURS AND FLIP   . APPENDECTOMY     as a child  . CHEST TUBE REMOVAL  07/13/11   RIGHT  CHEST TUBE  . CHOLECYSTECTOMY    . COLONOSCOPY    . COLONOSCOPY    . DILATION AND CURETTAGE OF UTERUS     x 2  . EYE SURGERY Bilateral  Cataract  . HEMORRHOID SURGERY    . LUNG BIOPSY  07/09/11   RIGHT UPER LOBE LUNG MASS   . LUNG LOBECTOMY Right 06/2011  . POSTERIOR CERVICAL FUSION/FORAMINOTOMY N/A 02/18/2017   Procedure: CERVICAL Five-Six, CERVICAL Six-Seven, CERVICAL Seven-THORACIC One, THORACIC One-Two POSTERIOR SPINAL FUSION WITH INSTRUMENTATION AND ALLOGRAFT;  Surgeon: Phylliss Bob, MD;  Location: [facility];  Service: Orthopedics;  Laterality: N/A;  . RIGHT CHEST TUBE PLACEMENT  07/10/11   S/P LUNG BX  . ROTATOR CUFF REPAIR     bilateral  . TOE SURGERY  2013   right small toe  . TONSILLECTOMY     as a child  . VIDEO BRONCHOSCOPY  07/17/2011   Procedure: VIDEO BRONCHOSCOPY;  Surgeon: Grace Isaac, MD;  Location: [facility];  Service: Thoracic;  Laterality: N/A;  . VIDEO BRONCHOSCOPY WITH ENDOBRONCHIAL ULTRASOUND N/A 12/10/2015   Procedure: VIDEO BRONCHOSCOPY WITH ENDOBRONCHIAL ULTRASOUND with  biopsies.;  Surgeon: Grace Isaac, MD;  Location: [facility];  Service: Thoracic;  Laterality: N/A;    There were no vitals filed for this visit.  Subjective Assessment - 09/10/17 1102    Subjective  doing well - excited to finish up with PT    Pertinent History  current lung cancer, HTN, DM, cervical fusion, back pain, OA, osteoporosis    Patient Stated Goals  improve tightness/soreness    Currently in Pain?  No/denies    Pain Score  0-No pain         [facility] PT Assessment - 09/10/17 0001      Observation/Other Assessments   Focus on Therapeutic Outcomes (FOTO)   Cervical: 63      ROM / Strength   AROM / PROM / Strength  Strength      Strength   Overall Strength Comments  B UE grossly 4/5 without pain                   [facility] Adult PT Treatment/Exercise - 09/10/17 0001      Neck Exercises: Seated   Neck Retraction  15 reps;5 secs    Shoulder Shrugs  15 reps    Shoulder Rolls  Backwards;Forwards;15 reps    Other Seated Exercise  scap retraction - 10 x 5 sec hold                  PT Long Term Goals - 09/10/17 1103      PT LONG TERM GOAL #1   Title  patient to be independent with advanced HEP     Status  Achieved      PT LONG TERM GOAL #2   Title  patient to improve B UE strength to >/= 4+/5    Status  Partially Met      PT LONG TERM GOAL #3   Title  patient to report pain reduction by >/= 50%    Status  Achieved      PT LONG TERM GOAL #4   Title  patient to demonstrate improved tissue pliability with less TTP by >/= 50%    Status  Achieved            Plan - 09/10/17 1104    Clinical Impression Statement  Samantha Quinn doing really well - reports general improvement in mobility at neck and upper back as well as improvements in overall pain and discomfort. Patient meeting or partially meeting all goals at this time with strength most likely limited due to prior defitis at B shoulders. patient understanding of  current HEP with good tolerance. Will  plan to d/c from PT today.     PT Treatment/Interventions  ADLs/Self Care Home Management;Cryotherapy;Electrical Stimulation;Iontophoresis 34m/ml Dexamethasone;Moist Heat;Therapeutic exercise;Therapeutic activities;Neuromuscular re-education;Patient/family education;Manual techniques;Taping;Dry needling;Passive range of motion    Consulted and Agree with Plan of Care  Patient       Patient will benefit from skilled therapeutic intervention in order to improve the following deficits and impairments:  Pain, Decreased strength, Decreased range of motion, Postural dysfunction, Increased muscle spasms  Visit Diagnosis: Cervicalgia  Cramp and spasm  Other symptoms and signs involving the musculoskeletal system  Abnormal posture     Problem List Patient Active Problem List   Diagnosis Date Noted  . IDA (iron deficiency anemia) 03/12/2017  . Radiculopathy 02/17/2017  . Lung cancer metastatic to bone ([facility] 02/05/2017  . Thyrotoxicosis without thyroid storm 01/18/2017  . Bilateral hearing loss 08/27/2016  . Nasal fracture 08/27/2016  . Knee injury 08/27/2016  . Head injury 08/27/2016  . Toxic multinodular goiter w/o crisis 06/22/2016  . Fatigue 05/05/2016  . Macular degeneration 03/09/2016  . Encounter for antineoplastic chemotherapy 12/19/2015  . Meralgia paresthetica of left side 07/10/2015  . Osteoporosis 07/10/2015  . Onychomycosis 07/10/2015  . Anxiety 07/10/2015  . Diabetes ([facility] 07/10/2015  . Cough 12/12/2013  . Pneumothorax after biopsy 07/09/2011    Class: Acute  . Primary adenocarcinoma of lower lobe of right lung ([facility] 06/24/2011  . Hypercholesterolemia 10/20/2010  . Essential hypertension, benign 10/20/2010  . Osteoarthritis 10/20/2010    SLanney Quinn PT, DPT 09/10/17 11:39 AM   [facility] [place] [address]Phone: [phone]  Fax:  [phone] Name: Samantha OLIVERIAMRN: [id number] of Birth: [birth year]

## 2017-09-10 NOTE — Patient Instructions (Signed)
Axial Extension (Chin Tuck)   Pull chin in and lengthen back of neck. Hold __5__ seconds while counting out loud. Repeat __10-15__ times. Do __2__ sessions per day.  Shoulder Shrug   Bring shoulders up toward ears. Hold __3-5__ seconds. Relax. Repeat __10-15__ times. Do __2__ sessions per day.  Shoulder Circle   Move shoulders up and around in a circle, forward and then backward. Repeat __10-15__ times. Do __2__ sessions per day.

## 2017-09-29 ENCOUNTER — Ambulatory Visit (INDEPENDENT_AMBULATORY_CARE_PROVIDER_SITE_OTHER): Payer: Medicare Other | Admitting: Internal Medicine

## 2017-09-29 ENCOUNTER — Encounter: Payer: Self-pay | Admitting: Internal Medicine

## 2017-09-29 VITALS — BP 132/74 | HR 74 | Temp 98.4°F | Resp 16 | Wt 122.0 lb

## 2017-09-29 DIAGNOSIS — E7800 Pure hypercholesterolemia, unspecified: Secondary | ICD-10-CM | POA: Diagnosis not present

## 2017-09-29 DIAGNOSIS — F419 Anxiety disorder, unspecified: Secondary | ICD-10-CM

## 2017-09-29 DIAGNOSIS — E119 Type 2 diabetes mellitus without complications: Secondary | ICD-10-CM | POA: Diagnosis not present

## 2017-09-29 DIAGNOSIS — I10 Essential (primary) hypertension: Secondary | ICD-10-CM

## 2017-09-29 NOTE — Assessment & Plan Note (Signed)
Controlled, stable Continue current dose of medication  

## 2017-09-29 NOTE — Assessment & Plan Note (Signed)
Sugar 120's at home Taking metformin - continue Follow up in six months

## 2017-09-29 NOTE — Assessment & Plan Note (Signed)
Check lipid panel  Continue daily statin Regular exercise and healthy diet encouraged  

## 2017-09-29 NOTE — Patient Instructions (Signed)
  A prescription for blood work was given.  Medications reviewed and updated.  No changes recommended at this time.    Please followup in 6 months

## 2017-09-29 NOTE — Assessment & Plan Note (Signed)
BP well controlled Current regimen effective and well tolerated Continue current medications at current doses cmp  

## 2017-09-29 NOTE — Progress Notes (Signed)
Subjective:    Patient ID: Samantha Quinn, female    DOB: 01-24-1928, 84 y.o.   MRN: [id number]  HPI The patient is here for follow up.  Hypertension: She is taking her medication daily. She is compliant with a low sodium diet.  She denies chest pain, palpitations, shortness of breath and regular headaches. She is not exercising much.  She does not monitor her blood pressure at home.    Diabetes: She is taking her medication daily as prescribed. She is compliant with a diabetic diet. She is not exercising regularly. She monitors her sugars and they have been running 120's. She checks her feet daily and denies foot lesions. She is up-to-date with an ophthalmology examination.   Hyperlipidemia: She is taking her medication daily. She is compliant with a low fat/cholesterol diet. She is not exercising regularly. She denies myalgias.   Metastatic adenocarcinoma lung cancer:  She is in observation now.  She had surgery and radiation on her cervical spine just over 6 months ago.    Lower back pain:  She is following with neurosurgery.  Her lower back pain limits her ability to walk.    Anxiety: She is taking her medication daily as prescribed. She denies any side effects from the medication. She feels her anxiety is well controlled and she is happy with her current dose of medication.   Left lateral rib pain:  It started a couple of weeks ago.  It is uncomfortable.  She states she has had this intermittently for years and it is not new.    Medications and allergies reviewed with patient and updated if appropriate.  Patient Active Problem List   Diagnosis Date Noted  . IDA (iron deficiency anemia) 03/12/2017  . Radiculopathy 02/17/2017  . Lung cancer metastatic to bone ([place]) 02/05/2017  . Thyrotoxicosis without thyroid storm 01/18/2017  . Bilateral hearing loss 08/27/2016  . Nasal fracture 08/27/2016  . Knee injury 08/27/2016  . Head injury 08/27/2016  . Toxic multinodular goiter w/o  crisis 06/22/2016  . Fatigue 05/05/2016  . Macular degeneration 03/09/2016  . Encounter for antineoplastic chemotherapy 12/19/2015  . Meralgia paresthetica of left side 07/10/2015  . Osteoporosis 07/10/2015  . Onychomycosis 07/10/2015  . Anxiety 07/10/2015  . Diabetes ([place]) 07/10/2015  . Cough 12/12/2013  . Pneumothorax after biopsy 07/09/2011    Class: Acute  . Primary adenocarcinoma of lower lobe of right lung ([place]) 06/24/2011  . Hypercholesterolemia 10/20/2010  . Essential hypertension, benign 10/20/2010  . Osteoarthritis 10/20/2010    Current Outpatient Medications on File Prior to Visit  Medication Sig Dispense Refill  . amitriptyline (ELAVIL) 10 MG tablet TAKE 1 TABLET BY MOUTH EVERYDAY AT BEDTIME 90 tablet 1  . bisoprolol-hydrochlorothiazide (ZIAC) 2.5-6.25 MG tablet TAKE 1 TABLET BY MOUTH EVERY DAY 90 tablet 0  . Blood Glucose Monitoring Suppl (ONE TOUCH ULTRA 2) w/Device KIT Use to check blood sugars twice a day Dx E11.9 1 each 0  . calcium carbonate (OS-CAL) 600 MG tablet Take 600 mg by mouth daily.    . cholecalciferol (VITAMIN D) 1000 units tablet Take 2,000 Units by mouth daily.     [name] gabapentin (NEURONTIN) 100 MG capsule Take 100 mg by mouth 3 (three) times daily.     [name] glucose blood (ONE TOUCH ULTRA TEST) test strip 1 each by Other route 2 (two) times daily. Use to check blood sugars twice a day 100 each 3  . HYDROcodone-homatropine (HYCODAN) 5-1.5 MG/5ML syrup Take 5 mLs by  mouth every 6 (six) hours as needed for cough. 120 mL 0  . Lancets (ONETOUCH ULTRASOFT) lancets 1 each by Other route 2 (two) times daily. Use to help check blood sugars twice a day 100 each 3  . lovastatin (MEVACOR) 20 MG tablet TAKE 1 TABLET BY MOUTH EVERY OTHER DAY 90 tablet 1  . metFORMIN (GLUCOPHAGE) 500 MG tablet TAKE 1 TABLET BY MOUTH TWICE A DAY 180 tablet 1  . methimazole (TAPAZOLE) 5 MG tablet Take 5 mg alternating with 2.5 mg every other day 90 tablet 2  . Multiple Vitamins-Minerals  (VISION-VITE PRESERVE PO) Take 1 tablet by mouth daily.     . Probiotic Product (ALIGN PO) Take by mouth daily.    . ramipril (ALTACE) 2.5 MG capsule TAKE 1 CAPSULE BY MOUTH EVERY DAY 90 capsule 1   No current facility-administered medications on file prior to visit.     Past Medical History:  Diagnosis Date  . Anxiety   . Back pain    bulding disc  . Bronchitis    hx of-many,many years   . Diabetes mellitus   . Diabetes mellitus    type 2 and takes Metformin bid  . Dyslipidemia   . Embolism - blood clot    hx of in left lower leg and was on Coumadin for about 39month;this was about 8-111yrago  . Family history of adverse reaction to anesthesia    Daughter- Nausea  . History of colonic polyps   . History of radiation therapy 01/06/16-02/19/16   The primary tumor and involved mediastinal adenopathy were treated to 66 Gy in 33 fractions of 2 Gy.  . Marland Kitchenistory of radiation therapy 04/01/17-04/26/17   cervical spine 35 Gy in 14 fractions  . HOH (hard of hearing)   . Hx of migraines    as a teenager  . Hypercholesterolemia   . Hyperlipidemia    takes Lovastatin every other day  . Hypertension    takes Ziac daily and Ramipril as well  . Hyperthyroidism   . Hypothyroidism    takes Synthroid every other day  . Insomnia    takes Elavil nightly  . Joint pain   . Lung mass   . Myalgia   . Osteoarthritis   . Osteoporosis   . Pneumothorax of right lung after biopsy 07/09/11   HAD CHEST TUBE PLACEMENT  . PONV (postoperative nausea and vomiting)   . Primary adenocarcinoma of lower lobe of right lung ([facility]2/27/2013   pT2a, pN0 M0 Stage 1 Well differenced Adenocarcinoma 3.5 cm resected 07/17/2011  . Primary adenocarcinoma of lung ([facility]2/27/2013  . Shortness of breath dyspnea    With exertion  . Skin cancer    legs have dark spots on them  . Upper respiratory infection 04/2011  . Urinary incontinence    takes Detrol daily prn    Past Surgical History:  Procedure Laterality Date    . ABDOMINAL HYSTERECTOMY    . ANTERIOR CERVICAL DECOMP/DISCECTOMY FUSION N/A 02/17/2017   Procedure: ANTERIOR CERVICAL DECOMPRESSION FUSION, CERVICAL 6-7 WITH INSTRUMENTATION AND ALLOGRAFT;  Surgeon: DuPhylliss BobMD;  [facility]: [facility] Service: [facility];  Laterality: N/A;  ANTERIOR CERVICAL DECOMPRESSION FUSION, CERVICAL 6-7 WITH INSTRUMENTATION AND ALLOGRAFT; REQUEST 2 HOURS AND FLIP   . APPENDECTOMY     as a child  . CHEST TUBE REMOVAL  07/13/11   RIGHT  CHEST TUBE  . CHOLECYSTECTOMY    . COLONOSCOPY    . COLONOSCOPY    . DILATION AND  CURETTAGE OF UTERUS     x 2  . EYE SURGERY Bilateral    Cataract  . HEMORRHOID SURGERY    . LUNG BIOPSY  07/09/11   RIGHT UPER LOBE LUNG MASS   . LUNG LOBECTOMY Right 06/2011  . POSTERIOR CERVICAL FUSION/FORAMINOTOMY N/A 02/18/2017   Procedure: CERVICAL Five-Six, CERVICAL Six-Seven, CERVICAL Seven-THORACIC One, THORACIC One-Two POSTERIOR SPINAL FUSION WITH INSTRUMENTATION AND ALLOGRAFT;  Surgeon: Phylliss Bob, MD;  Location: [facility];  Service: Orthopedics;  Laterality: N/A;  . RIGHT CHEST TUBE PLACEMENT  07/10/11   S/P LUNG BX  . ROTATOR CUFF REPAIR     bilateral  . TOE SURGERY  2013   right small toe  . TONSILLECTOMY     as a child  . VIDEO BRONCHOSCOPY  07/17/2011   Procedure: VIDEO BRONCHOSCOPY;  Surgeon: Grace Isaac, MD;  Location: [facility];  Service: Thoracic;  Laterality: N/A;  . VIDEO BRONCHOSCOPY WITH ENDOBRONCHIAL ULTRASOUND N/A 12/10/2015   Procedure: VIDEO BRONCHOSCOPY WITH ENDOBRONCHIAL ULTRASOUND with biopsies.;  Surgeon: Grace Isaac, MD;  [facility] OR;  Service: Thoracic;  Laterality: N/A;    Social History   Socioeconomic History  . Marital status: Widowed    Spouse name: Not on file  . Number of children: Not on file  . Years of education: Not on file  . Highest education level: Not on file  Occupational History  . Not on file  Social Needs  . Financial resource strain: Not on file  . Food insecurity:     Worry: Not on file    Inability: Not on file  . Transportation needs:    Medical: Not on file    Non-medical: Not on file  Tobacco Use  . Smoking status: Never Smoker  . Smokeless tobacco: Never Used  Substance and Sexual Activity  . Alcohol use: No    Alcohol/week: 0.0 oz    Comment: "rarely have a glass of wine"  . Drug use: No  . Sexual activity: Never    Birth control/protection: Surgical  Lifestyle  . Physical activity:    Days per week: Not on file    Minutes per session: Not on file  . Stress: Not on file  Relationships  . Social connections:    Talks on phone: Not on file    Gets together: Not on file    Attends religious service: Not on file    Active member of club or organization: Not on file    Attends meetings of clubs or organizations: Not on file    Relationship status: Not on file  Other Topics Concern  . Not on file  Social History Narrative  . Not on file    Family History  Problem Relation Age of Onset  . Hypertension Mother   . Hypertension Father   . Arthritis Sister   . Arthritis Sister   . Diabetes Brother   . Hypertension Sister   . Anesthesia problems Neg Hx   . Hypotension Neg Hx   . Malignant hyperthermia Neg Hx   . Pseudochol deficiency Neg Hx   . Heart attack Neg Hx     Review of Systems  Constitutional: Negative for chills and fever.  HENT: Positive for trouble swallowing (from radiation to c-spine).   Respiratory: Positive for cough (chronic, dry) and shortness of breath (with exertion - chronic). Negative for wheezing.   Cardiovascular: Positive for leg swelling (occ, left leg - mild). Negative for chest pain and palpitations.  Gastrointestinal: Negative  for abdominal pain, constipation, diarrhea and nausea.  Musculoskeletal: Positive for back pain (lower back - chronic).  Neurological: Positive for dizziness (rare). Negative for light-headedness and headaches.       Objective:   Vitals:   09/29/17 1335  BP: 132/74    Pulse: 74  Resp: 16  Temp: 98.4 F (36.9 C)  SpO2: 97%   BP Readings from Last 3 Encounters:  09/29/17 132/74  08/11/17 (!) 154/66  06/09/17 (!) 149/66   Wt Readings from Last 3 Encounters:  09/29/17 122 lb (55.3 kg)  08/11/17 121 lb (54.9 kg)  06/09/17 120 lb 12.8 oz (54.8 kg)   Body mass index is 23.05 kg/m.   Physical Exam    Constitutional: Appears well-developed and well-nourished. No distress.  HENT:  Head: Normocephalic and atraumatic.  Neck: Neck supple. No tracheal deviation present. No thyromegaly present.  No cervical lymphadenopathy Cardiovascular: Normal rate, regular rhythm and normal heart sounds.   No murmur heard. No carotid bruit .  No edema Pulmonary/Chest: no focal tenderness left lateral ribs, Effort normal and breath sounds normal. No respiratory distress. No has no wheezes. No rales.  Skin: Skin is warm and dry. Not diaphoretic.  Psychiatric: Normal mood and affect. Behavior is normal.      Assessment & Plan:    See Problem List for Assessment and Plan of chronic medical problems.

## 2017-10-04 ENCOUNTER — Other Ambulatory Visit: Payer: Self-pay | Admitting: Internal Medicine

## 2017-10-13 ENCOUNTER — Inpatient Hospital Stay: Payer: Medicare Other

## 2017-10-13 ENCOUNTER — Inpatient Hospital Stay: Payer: Medicare Other | Attending: Hematology & Oncology | Admitting: Hematology & Oncology

## 2017-10-13 VITALS — BP 123/58 | HR 71 | Temp 98.2°F | Resp 17 | Wt 122.8 lb

## 2017-10-13 DIAGNOSIS — C3490 Malignant neoplasm of unspecified part of unspecified bronchus or lung: Secondary | ICD-10-CM

## 2017-10-13 DIAGNOSIS — E119 Type 2 diabetes mellitus without complications: Secondary | ICD-10-CM | POA: Diagnosis not present

## 2017-10-13 DIAGNOSIS — Z7984 Long term (current) use of oral hypoglycemic drugs: Secondary | ICD-10-CM | POA: Diagnosis not present

## 2017-10-13 DIAGNOSIS — Z9221 Personal history of antineoplastic chemotherapy: Secondary | ICD-10-CM | POA: Insufficient documentation

## 2017-10-13 DIAGNOSIS — Z923 Personal history of irradiation: Secondary | ICD-10-CM | POA: Insufficient documentation

## 2017-10-13 DIAGNOSIS — Z79899 Other long term (current) drug therapy: Secondary | ICD-10-CM | POA: Diagnosis not present

## 2017-10-13 DIAGNOSIS — C7951 Secondary malignant neoplasm of bone: Principal | ICD-10-CM

## 2017-10-13 DIAGNOSIS — E0800 Diabetes mellitus due to underlying condition with hyperosmolarity without nonketotic hyperglycemic-hyperosmolar coma (NKHHC): Secondary | ICD-10-CM

## 2017-10-13 DIAGNOSIS — G893 Neoplasm related pain (acute) (chronic): Secondary | ICD-10-CM | POA: Insufficient documentation

## 2017-10-13 DIAGNOSIS — D508 Other iron deficiency anemias: Secondary | ICD-10-CM

## 2017-10-13 DIAGNOSIS — C3411 Malignant neoplasm of upper lobe, right bronchus or lung: Secondary | ICD-10-CM | POA: Diagnosis not present

## 2017-10-13 DIAGNOSIS — Z7951 Long term (current) use of inhaled steroids: Secondary | ICD-10-CM | POA: Diagnosis not present

## 2017-10-13 DIAGNOSIS — C3431 Malignant neoplasm of lower lobe, right bronchus or lung: Secondary | ICD-10-CM

## 2017-10-13 LAB — CBC WITH DIFFERENTIAL (CANCER CENTER ONLY)
BASOS ABS: 0 10*3/uL (ref 0.0–0.1)
BASOS PCT: 0 %
Eosinophils Absolute: 0.1 10*3/uL (ref 0.0–0.5)
Eosinophils Relative: 1 %
HCT: 40 % (ref 34.8–46.6)
HEMOGLOBIN: 12.9 g/dL (ref 11.6–15.9)
LYMPHS PCT: 10 %
Lymphs Abs: 0.6 10*3/uL — ABNORMAL LOW (ref 0.9–3.3)
MCH: 29.5 pg (ref 26.0–34.0)
MCHC: 32.3 g/dL (ref 32.0–36.0)
MCV: 91.3 fL (ref 81.0–101.0)
MONOS PCT: 8 %
Monocytes Absolute: 0.4 10*3/uL (ref 0.1–0.9)
NEUTROS PCT: 81 %
Neutro Abs: 4.7 10*3/uL (ref 1.5–6.5)
Platelet Count: 238 10*3/uL (ref 145–400)
RBC: 4.38 MIL/uL (ref 3.70–5.32)
RDW: 14 % (ref 11.1–15.7)
WBC Count: 5.8 10*3/uL (ref 3.9–10.0)

## 2017-10-13 LAB — CMP (CANCER CENTER ONLY)
ALBUMIN: 3.8 g/dL (ref 3.5–5.0)
ALK PHOS: 81 U/L (ref 26–84)
ALT: 17 U/L (ref 10–47)
ANION GAP: 14 (ref 5–15)
AST: 20 U/L (ref 11–38)
BILIRUBIN TOTAL: 0.9 mg/dL (ref 0.2–1.6)
BUN: 15 mg/dL (ref 7–22)
CALCIUM: 9.3 mg/dL (ref 8.0–10.3)
CO2: 30 mmol/L (ref 18–33)
CREATININE: 0.6 mg/dL (ref 0.60–1.20)
Chloride: 100 mmol/L (ref 98–108)
Glucose, Bld: 120 mg/dL — ABNORMAL HIGH (ref 73–118)
Potassium: 4.1 mmol/L (ref 3.3–4.7)
Sodium: 144 mmol/L (ref 128–145)
Total Protein: 7.4 g/dL (ref 6.4–8.1)

## 2017-10-13 LAB — HEMOGLOBIN A1C
HEMOGLOBIN A1C: 6 % — AB (ref 4.8–5.6)
MEAN PLASMA GLUCOSE: 125.5 mg/dL

## 2017-10-13 MED ORDER — ZOLEDRONIC ACID 4 MG/5ML IV CONC
3.0000 mg | Freq: Once | INTRAVENOUS | Status: AC
  Administered 2017-10-13: 3 mg via INTRAVENOUS
  Filled 2017-10-13: qty 3.75

## 2017-10-13 MED ORDER — SODIUM CHLORIDE 0.9 % IV SOLN
INTRAVENOUS | Status: DC
  Administered 2017-10-13: 14:00:00 via INTRAVENOUS

## 2017-10-13 NOTE — Progress Notes (Signed)
Hematology and Oncology Follow Up Visit  Samantha Quinn [id number] 1928/03/20 84 y.o. 10/13/2017   Principle Diagnosis:  Stage IV adenocarcinoma of the right lung-mutation negative C6-7 left nerve root compression secondary to metastatic disease - PD-L1 (0%) /TMB of 9  Past Therapy: Status post radiation/chemotherapy for recurrence-completed October 2017 Status post right upper lobectomy in March 2013 for stage IB disease XRT to the cervical spine - started 04/02/2017   Current Therapy:   Tecentriq 1200 mg IV every 3 weeks s/p cycle 8 Zometa 4 mg IV q 2 months   Interim History:  Samantha Quinn is here today for follow-up.  She actually looks quite good.  She feels good.  She had a little bit of discomfort on the left side.  This is in the lower left rib cage.  This does not happen all the time.  It seems to happen when she stands up from sitting down.  Her neck is doing quite well.  She is not complaining of any pain in her neck.  She is eating well.  She is had no problems with nausea or vomiting.  She has had no cough or shortness of breath.  She has had no leg swelling.  She has had no change in bowel or bladder habits.  There is been no bleeding.  Overall, her performance status is ECOG 1.  Medications:  Allergies as of 10/13/2017      Reactions   Tramadol Other (See Comments)   syncope   Codeine Nausea And Vomiting   Demerol Nausea Only      Medication List        Accurate as of 10/13/17  1:52 PM. Always use your most recent med list.          ALIGN PO Take by mouth daily.   amitriptyline 10 MG tablet Commonly known as:  ELAVIL TAKE 1 TABLET BY MOUTH EVERYDAY AT BEDTIME   bisoprolol-hydrochlorothiazide 2.5-6.25 MG tablet Commonly known as:  ZIAC TAKE 1 TABLET BY MOUTH EVERY DAY   calcium carbonate 600 MG tablet Commonly known as:  OS-CAL Take 600 mg by mouth daily.   cholecalciferol 1000 units tablet Commonly known as:  VITAMIN D Take 2,000 Units by mouth  daily.   gabapentin 100 MG capsule Commonly known as:  NEURONTIN Take 100 mg by mouth 3 (three) times daily.   glucose blood test strip Commonly known as:  ONE TOUCH ULTRA TEST 1 each by Other route 2 (two) times daily. Use to check blood sugars twice a day   HYDROcodone-homatropine 5-1.5 MG/5ML syrup Commonly known as:  HYCODAN Take 5 mLs by mouth every 6 (six) hours as needed for cough.   lovastatin 20 MG tablet Commonly known as:  MEVACOR TAKE 1 TABLET BY MOUTH EVERY OTHER DAY   metFORMIN 500 MG tablet Commonly known as:  GLUCOPHAGE TAKE 1 TABLET BY MOUTH TWICE A DAY   methimazole 5 MG tablet Commonly known as:  TAPAZOLE Take 5 mg alternating with 2.5 mg every other day   ONE TOUCH ULTRA 2 w/Device Kit Use to check blood sugars twice a day Dx E11.9   onetouch ultrasoft lancets 1 each by Other route 2 (two) times daily. Use to help check blood sugars twice a day   ramipril 2.5 MG capsule Commonly known as:  ALTACE TAKE 1 CAPSULE BY MOUTH EVERY DAY   VISION-VITE PRESERVE PO Take 1 tablet by mouth daily.       Allergies:  Allergies  Allergen Reactions  .  Tramadol Other (See Comments)    syncope  . Codeine Nausea And Vomiting  . Demerol Nausea Only    Past Medical History, Surgical history, Social history, and Family History were reviewed and updated.  Review of Systems: Review of Systems  Constitutional: Negative.   HENT: Negative.   Eyes: Negative.   Respiratory: Negative.   Cardiovascular: Negative.   Gastrointestinal: Negative.   Genitourinary: Negative.   Musculoskeletal: Positive for back pain and joint pain.  Skin: Negative.   Neurological: Negative.   Endo/Heme/Allergies: Negative.   Psychiatric/Behavioral: Negative.       Physical Exam:  weight is 122 lb 12 oz (55.7 kg). Her oral temperature is 98.2 F (36.8 C). Her blood pressure is 123/58 (abnormal) and her pulse is 71. Her respiration is 17 and oxygen saturation is 100%.   Wt  Readings from Last 3 Encounters:  10/13/17 122 lb 12 oz (55.7 kg)  09/29/17 122 lb (55.3 kg)  08/11/17 121 lb (54.9 kg)    Physical Exam  Constitutional: She is oriented to person, place, and time.  HENT:  Head: Normocephalic and atraumatic.  Mouth/Throat: Oropharynx is clear and moist.  Eyes: Pupils are equal, round, and reactive to light. EOM are normal.  Neck: Normal range of motion.  Cardiovascular: Normal rate, regular rhythm and normal heart sounds.  Pulmonary/Chest: Effort normal and breath sounds normal.  Abdominal: Soft. Bowel sounds are normal.  Musculoskeletal: Normal range of motion. She exhibits no edema, tenderness or deformity.  Lymphadenopathy:    She has no cervical adenopathy.  Neurological: She is alert and oriented to person, place, and time.  Skin: Skin is warm and dry. No rash noted. No erythema.  Psychiatric: She has a normal mood and affect. Her behavior is normal. Judgment and thought content normal.  Vitals reviewed.    Lab Results  Component Value Date   WBC 5.8 10/13/2017   HGB 12.9 10/13/2017   HCT 40.0 10/13/2017   MCV 91.3 10/13/2017   PLT 238 10/13/2017   Lab Results  Component Value Date   FERRITIN 514 (H) 04/07/2017   IRON 61 04/07/2017   TIBC 299 04/07/2017   UIBC 238 04/07/2017   IRONPCTSAT 20 (L) 04/07/2017   Lab Results  Component Value Date   RBC 4.38 10/13/2017   No results found for: KPAFRELGTCHN, LAMBDASER, KAPLAMBRATIO No results found for: IGGSERUM, IGA, IGMSERUM No results found for: Odetta Pink, SPEI   Chemistry      Component Value Date/Time   NA 142 08/11/2017 1343   NA 144 04/07/2017 1215   NA 135 (L) 03/11/2017 1416   K 3.6 08/11/2017 1343   K 4.0 04/07/2017 1215   K 3.9 03/11/2017 1416   CL 102 08/11/2017 1343   CL 103 04/07/2017 1215   CO2 30 08/11/2017 1343   CO2 29 04/07/2017 1215   CO2 23 03/11/2017 1416   BUN 16 08/11/2017 1343   BUN 13  04/07/2017 1215   BUN 16.1 03/11/2017 1416   CREATININE 0.60 08/11/2017 1343   CREATININE 0.8 04/07/2017 1215   CREATININE 0.9 03/11/2017 1416      Component Value Date/Time   CALCIUM 9.9 08/11/2017 1343   CALCIUM 9.6 04/07/2017 1215   CALCIUM 9.6 03/11/2017 1416   ALKPHOS 73 08/11/2017 1343   ALKPHOS 60 04/07/2017 1215   ALKPHOS 79 03/11/2017 1416   AST 21 08/11/2017 1343   AST 11 03/11/2017 1416   ALT 19 08/11/2017 1343  ALT 15 04/07/2017 1215   ALT 11 03/11/2017 1416   BILITOT 0.8 08/11/2017 1343   BILITOT 0.26 03/11/2017 1416      Impression and Plan: Samantha Quinn is a pleasant 85 yo caucasian female with stage IV progressive non small cell adenocarcinoma of the lung.   We will go ahead and get her set up with a PET scan to be done in July.  I will see her back about a week afterwards.  We will see how the PET scan looks.  Her Zometa who is not due until August.  [name], MD 6/19/20191:52 PM

## 2017-10-19 ENCOUNTER — Telehealth: Payer: Self-pay

## 2017-10-19 NOTE — Telephone Encounter (Signed)
Returning pt's call re: having tooth extracted. Pt's final Radiation treatment was approximately 6 months prior and pt has tolerated extensive dental work since that time. Pt endorses intermittent c/o dry mouth, which is relieved with Biotene rinse and gel. Conveyed to pt that dental extraction would be appropriate. Pt denied further questions/concerns. Loma Sousa, RN BSN

## 2017-10-20 NOTE — Telephone Encounter (Signed)
Received call from pt's dentist re: planned dental extraction. Dentist specifically requesting if mandible had been in treatment field. Per treatment plan in [place], mandible had not been in radiation treatment field, and conveyed to dentist. Dentist without further questions/concerns. Loma Sousa, RN BSN

## 2017-10-26 DIAGNOSIS — H903 Sensorineural hearing loss, bilateral: Secondary | ICD-10-CM | POA: Diagnosis not present

## 2017-10-27 ENCOUNTER — Other Ambulatory Visit: Payer: Medicare Other

## 2017-10-27 ENCOUNTER — Other Ambulatory Visit (INDEPENDENT_AMBULATORY_CARE_PROVIDER_SITE_OTHER): Payer: Medicare Other

## 2017-10-27 ENCOUNTER — Other Ambulatory Visit: Payer: Self-pay

## 2017-10-27 DIAGNOSIS — E0590 Thyrotoxicosis, unspecified without thyrotoxic crisis or storm: Secondary | ICD-10-CM

## 2017-10-27 LAB — T3, FREE: T3, Free: 3.2 pg/mL (ref 2.3–4.2)

## 2017-10-27 LAB — TSH: TSH: 5.27 u[IU]/mL — AB (ref 0.35–4.50)

## 2017-10-27 LAB — T4, FREE: Free T4: 0.66 ng/dL (ref 0.60–1.60)

## 2017-10-27 NOTE — Progress Notes (Signed)
Lab orders

## 2017-10-29 ENCOUNTER — Other Ambulatory Visit: Payer: Self-pay | Admitting: Internal Medicine

## 2017-10-29 DIAGNOSIS — E0590 Thyrotoxicosis, unspecified without thyrotoxic crisis or storm: Secondary | ICD-10-CM

## 2017-10-29 MED ORDER — METHIMAZOLE 5 MG PO TABS: ORAL_TABLET | ORAL | 2 refills | Status: DC

## 2017-11-08 ENCOUNTER — Other Ambulatory Visit: Payer: Self-pay | Admitting: Internal Medicine

## 2017-11-08 DIAGNOSIS — E119 Type 2 diabetes mellitus without complications: Secondary | ICD-10-CM

## 2017-11-10 ENCOUNTER — Encounter (HOSPITAL_COMMUNITY)
Admission: RE | Admit: 2017-11-10 | Discharge: 2017-11-10 | Disposition: A | Discharge status: Home / Self Care | Payer: Medicare Other | Source: Ambulatory Visit | Attending: Hematology & Oncology | Admitting: Hematology & Oncology

## 2017-11-10 DIAGNOSIS — C3490 Malignant neoplasm of unspecified part of unspecified bronchus or lung: Secondary | ICD-10-CM | POA: Insufficient documentation

## 2017-11-10 DIAGNOSIS — E0800 Diabetes mellitus due to underlying condition with hyperosmolarity without nonketotic hyperglycemic-hyperosmolar coma (NKHHC): Secondary | ICD-10-CM

## 2017-11-10 DIAGNOSIS — C7951 Secondary malignant neoplasm of bone: Secondary | ICD-10-CM | POA: Insufficient documentation

## 2017-11-10 LAB — GLUCOSE, CAPILLARY: Glucose-Capillary: 95 mg/dL (ref 70–99)

## 2017-11-10 MED ORDER — FLUDEOXYGLUCOSE F - 18 (FDG) INJECTION
5.8800 | Freq: Once | INTRAVENOUS | Status: AC | PRN
  Administered 2017-11-10: 5.88 via INTRAVENOUS

## 2017-11-12 ENCOUNTER — Inpatient Hospital Stay: Payer: Medicare Other | Attending: Hematology & Oncology | Admitting: Hematology & Oncology

## 2017-11-12 ENCOUNTER — Encounter: Payer: Self-pay | Admitting: Hematology & Oncology

## 2017-11-12 DIAGNOSIS — C3411 Malignant neoplasm of upper lobe, right bronchus or lung: Secondary | ICD-10-CM | POA: Insufficient documentation

## 2017-11-12 DIAGNOSIS — E119 Type 2 diabetes mellitus without complications: Secondary | ICD-10-CM | POA: Diagnosis not present

## 2017-11-12 DIAGNOSIS — Z923 Personal history of irradiation: Secondary | ICD-10-CM | POA: Diagnosis not present

## 2017-11-12 DIAGNOSIS — C7951 Secondary malignant neoplasm of bone: Secondary | ICD-10-CM | POA: Diagnosis not present

## 2017-11-12 DIAGNOSIS — Z79899 Other long term (current) drug therapy: Secondary | ICD-10-CM

## 2017-11-12 DIAGNOSIS — Z7984 Long term (current) use of oral hypoglycemic drugs: Secondary | ICD-10-CM | POA: Diagnosis not present

## 2017-11-12 DIAGNOSIS — M25552 Pain in left hip: Secondary | ICD-10-CM | POA: Diagnosis not present

## 2017-11-12 DIAGNOSIS — Z7189 Other specified counseling: Secondary | ICD-10-CM

## 2017-11-12 HISTORY — DX: Other specified counseling: Z71.89

## 2017-11-12 NOTE — Progress Notes (Signed)
DISCONTINUE ON PATHWAY REGIMEN - Non-Small Cell Lung     A cycle is every 21 days:     Atezolizumab   **Always confirm dose/schedule in your pharmacy ordering system**  REASON: Disease Progression PRIOR TREATMENT: [id number]: Atezolizumab 1200 mg q21 Days Until Progression or Toxicity TREATMENT RESPONSE: Partial Response (PR)  START OFF PATHWAY REGIMEN - Non-Small Cell Lung   [id number]:Pemetrexed + Bevacizumab Maintenance q3weeks:   A cycle is every 21 days:     Pemetrexed      Bevacizumab   **Always confirm dose/schedule in your pharmacy ordering system**  Patient Characteristics: Stage IV Metastatic, Nonsquamous, Inappropriate Line Of Therapy - Not Appropriate AJCC T Category: T2a Current Disease Status: Distant Metastases AJCC N Category: N2 AJCC M Category: M1a AJCC 8 Stage Grouping: IVA Histology: Nonsquamous Cell ROS1 Rearrangement Status: Negative T790M Mutation Status: Not Applicable - EGFR Mutation Negative/Unknown Other Mutations/Biomarkers: No Other Actionable Mutations NTRK Gene Fusion Status: Negative PD-L1 Expression Status: PD-L1 Positive 1-49% (TPS) Chemotherapy/Immunotherapy LOT: Not Appropriate Molecular Targeted Therapy: Not Appropriate ALK Translocation Status: Negative EGFR Mutation Status: Negative/Wild Type BRAF V600E Mutation Status: Negative Intent of Therapy: Non-Curative / Palliative Intent, Discussed with Patient

## 2017-11-12 NOTE — Progress Notes (Signed)
Hematology and Oncology Follow Up Visit  Samantha Quinn [id number] [birth year] 84 y.o. 11/12/2017   Principle Diagnosis:  Stage IV adenocarcinoma of the right lung-mutation negative C6-7 left nerve root compression secondary to metastatic disease - PD-L1 (0%) /TMB of 9  Past Therapy: Status post radiation/chemotherapy for recurrence-completed October 2017 Status post right upper lobectomy in March 2013 for stage IB disease XRT to the cervical spine - started 04/02/2017   Current Therapy:   Tecentriq 1200 mg IV every 3 weeks s/p cycle 8 -- d/c progression Zometa 4 mg IV q 2 months Alimta/Avastin -- cycle #1 on 11/26/2017   Interim History:  Samantha Quinn is here today for follow-up.  She comes in with her daughter and daughter-in-law.  Unfortunately, we clearly see that there is disease progression.  She had a PET scan done earlier this week.  The PET scan showed new areas of activity only in her bones.  She had activity at T3, L3, sternum, left scapula, and left iliac.  There is a moderate right effusion which is similar.  There is to subpleural nodules on the left side which are not active.  She is complaining of pain in the left hip.  I think this is probably is going to need radiation treatments.  I just feel bad that we now have clear as systemic disease.  It is unusual that this is only involving her bones and not her organs or lymph nodes.  We have to come up with a treatment option for her.  She is 84 years old.  She has a performance status of ECOG 1-2.  I think that systemic chemotherapy will be necessary.  I think that the combination of Alimta and Avastin would be reasonable and well-tolerated and effective.  She needs to have a tooth taken out.  This can be done next week.  She is eating well.  She is not losing weight.  She has had no problems with bowels or bladder.  She has had no nausea or vomiting.  She is had no bleeding.  She is had no rashes.  Medications:    Allergies as of 11/12/2017      Reactions   Tramadol Other (See Comments)   syncope   Codeine Nausea And Vomiting   Demerol Nausea Only      Medication List        Accurate as of 11/12/17  3:19 PM. Always use your most recent med list.          ALIGN PO Take by mouth daily.   amitriptyline 10 MG tablet Commonly known as:  ELAVIL TAKE 1 TABLET BY MOUTH EVERYDAY AT BEDTIME   bisoprolol-hydrochlorothiazide 2.5-6.25 MG tablet Commonly known as:  ZIAC TAKE 1 TABLET BY MOUTH EVERY DAY   calcium carbonate 600 MG tablet Commonly known as:  OS-CAL Take 600 mg by mouth daily.   cholecalciferol 1000 units tablet Commonly known as:  VITAMIN D Take 2,000 Units by mouth daily.   gabapentin 100 MG capsule Commonly known as:  NEURONTIN Take 100 mg by mouth 3 (three) times daily.   glucose blood test strip Commonly known as:  ONE TOUCH ULTRA TEST 1 each by Other route 2 (two) times daily. Use to check blood sugars twice a day   HYDROcodone-homatropine 5-1.5 MG/5ML syrup Commonly known as:  HYCODAN Take 5 mLs by mouth every 6 (six) hours as needed for cough.   lovastatin 20 MG tablet Commonly known as:  MEVACOR TAKE 1 TABLET BY  MOUTH EVERY OTHER DAY   metFORMIN 500 MG tablet Commonly known as:  GLUCOPHAGE TAKE 1 TABLET BY MOUTH TWICE A DAY   metFORMIN 500 MG tablet Commonly known as:  GLUCOPHAGE Take 1 tablet (500 mg total) by mouth 2 (two) times daily with a meal.   methimazole 5 MG tablet Commonly known as:  TAPAZOLE Take 2.5 mg every  day   ONE TOUCH ULTRA 2 w/Device Kit Use to check blood sugars twice a day Dx E11.9   onetouch ultrasoft lancets 1 each by Other route 2 (two) times daily. Use to help check blood sugars twice a day   ramipril 2.5 MG capsule Commonly known as:  ALTACE TAKE 1 CAPSULE BY MOUTH EVERY DAY   VISION-VITE PRESERVE PO Take 1 tablet by mouth daily.       Allergies:  Allergies  Allergen Reactions  . Tramadol Other (See Comments)     syncope  . Codeine Nausea And Vomiting  . Demerol Nausea Only    Past Medical History, Surgical history, Social history, and Family History were reviewed and updated.  Review of Systems: Review of Systems  Constitutional: Negative.   HENT: Negative.   Eyes: Negative.   Respiratory: Negative.   Cardiovascular: Negative.   Gastrointestinal: Negative.   Genitourinary: Negative.   Musculoskeletal: Positive for back pain and joint pain.  Skin: Negative.   Neurological: Negative.   Endo/Heme/Allergies: Negative.   Psychiatric/Behavioral: Negative.       Physical Exam:  weight is 120 lb (54.4 kg). Her oral temperature is 98.5 F (36.9 C). Her blood pressure is 152/63 (abnormal) and her pulse is 84.   Wt Readings from Last 3 Encounters:  11/12/17 120 lb (54.4 kg)  10/13/17 122 lb 12 oz (55.7 kg)  09/29/17 122 lb (55.3 kg)    Physical Exam  Constitutional: She is oriented to person, place, and time.  HENT:  Head: Normocephalic and atraumatic.  Mouth/Throat: Oropharynx is clear and moist.  Eyes: Pupils are equal, round, and reactive to light. EOM are normal.  Neck: Normal range of motion.  Cardiovascular: Normal rate, regular rhythm and normal heart sounds.  Pulmonary/Chest: Effort normal and breath sounds normal.  Abdominal: Soft. Bowel sounds are normal.  Musculoskeletal: Normal range of motion. She exhibits no edema, tenderness or deformity.  Lymphadenopathy:    She has no cervical adenopathy.  Neurological: She is alert and oriented to person, place, and time.  Skin: Skin is warm and dry. No rash noted. No erythema.  Psychiatric: She has a normal mood and affect. Her behavior is normal. Judgment and thought content normal.  Vitals reviewed.    Lab Results  Component Value Date   WBC 5.8 10/13/2017   HGB 12.9 10/13/2017   HCT 40.0 10/13/2017   MCV 91.3 10/13/2017   PLT 238 10/13/2017   Lab Results  Component Value Date   FERRITIN 514 (H) 04/07/2017   IRON  61 04/07/2017   TIBC 299 04/07/2017   UIBC 238 04/07/2017   IRONPCTSAT 20 (L) 04/07/2017   Lab Results  Component Value Date   RBC 4.38 10/13/2017   No results found for: KPAFRELGTCHN, LAMBDASER, KAPLAMBRATIO No results found for: IGGSERUM, IGA, IGMSERUM No results found for: TOTALPROTELP, ALBUMINELP, A1GS, A2GS, Violet Baldy, MSPIKE, SPEI   Chemistry      Component Value Date/Time   NA 144 10/13/2017 1322   NA 144 04/07/2017 1215   NA 135 (L) 03/11/2017 1416   K 4.1 10/13/2017 1322   K  4.0 04/07/2017 1215   K 3.9 03/11/2017 1416   CL 100 10/13/2017 1322   CL 103 04/07/2017 1215   CO2 30 10/13/2017 1322   CO2 29 04/07/2017 1215   CO2 23 03/11/2017 1416   BUN 15 10/13/2017 1322   BUN 13 04/07/2017 1215   BUN 16.1 03/11/2017 1416   CREATININE 0.60 10/13/2017 1322   CREATININE 0.8 04/07/2017 1215   CREATININE 0.9 03/11/2017 1416      Component Value Date/Time   CALCIUM 9.3 10/13/2017 1322   CALCIUM 9.6 04/07/2017 1215   CALCIUM 9.6 03/11/2017 1416   ALKPHOS 81 10/13/2017 1322   ALKPHOS 60 04/07/2017 1215   ALKPHOS 79 03/11/2017 1416   AST 20 10/13/2017 1322   AST 11 03/11/2017 1416   ALT 17 10/13/2017 1322   ALT 15 04/07/2017 1215   ALT 11 03/11/2017 1416   BILITOT 0.9 10/13/2017 1322   BILITOT 0.26 03/11/2017 1416      Impression and Plan: Ms. Martire is a pleasant 84 yo caucasian female with stage IV progressive non small cell adenocarcinoma of the lung.   I spent about 60 minutes with she and her family.  All the time was spent face-to-face with them.  I counseled them and coordinated care with respect to radiation and chemotherapy.  She already has a Port-A-Cath and so this will not be necessary to place.  I went over the PET scan with them.  I explained why I thought that radiation would help.  If no treatment was given, I think she would clearly have fairly rapid progression of disease with other bony metastasis and ultimately organ  metastasis.  She still has a good performance status and a good quality of life.  I want to try to maintain this.  I think the combination of radiation therapy and chemotherapy will do this.  She and her family certainly know that this disease is not good to be cured but that we can at least control it and allow her to have a good quality of life.  Again, we will try to get started in a couple weeks.  I will plan to see her back when we start treatment.    [name], MD 7/19/20193:19 PM

## 2017-11-15 NOTE — Progress Notes (Signed)
Histology and Location of Primary Cancer: Principle Diagnosis:  Stage IV adenocarcinoma of the right lung-mutation negative C6-7 left nerve root compression secondary to metastatic disease - PD-L1 (0%) /TMB of 9    Sites of Visceral and Bony Metastatic Disease:  The PET scan showed new areas of activity only in her bones.  She had activity at T3, L3, sternum, left scapula, and left iliac.     Location(s) of Symptomatic Metastases: left iliac. Per Dr. Marin Olp 11/12/17:  She is complaining of pain in the left hip.  I think this is probably is going to need radiation treatments.    Past/Anticipated chemotherapy by medical oncology, if any: Per Dr. Marin Olp 11/12/17:  I think that systemic chemotherapy will be necessary.  I think that the combination of Alimta and Avastin would be reasonable and well-tolerated and effective.    Pain on a scale of 0-10 is: pain in left hip is described as "low level discomfort" 2/10 pain scale   If Spine Met(s), symptoms, if any, include:  Bowel/Bladder retention or incontinence (please describe): No  Numbness or weakness in extremities (please describe): LUE, from past surgery on neck. Not cancer related.   Current Decadron regimen, if applicable: N/A  Ambulatory status? Walker? Wheelchair?:  Ambulating without assistive device.  SAFETY ISSUES:  Prior radiation? Yes, neck and lung  Pacemaker/ICD? No  Possible current pregnancy? No, pt post-surgical  Is the patient on methotrexate? No  Current Complaints / other details:  Pt presents today for reconsult for Radiation Oncology with Dr. Sondra Come. Pt with left hip pain from symptomatic metastasis to left iliac. Pt is accompanied by daughter in law.    Loma Sousa, RN BSN

## 2017-11-16 ENCOUNTER — Ambulatory Visit
Admission: RE | Admit: 2017-11-16 | Discharge: 2017-11-16 | Disposition: A | Discharge status: Home / Self Care | Payer: Medicare Other | Source: Ambulatory Visit | Attending: Radiation Oncology | Admitting: Radiation Oncology

## 2017-11-16 ENCOUNTER — Other Ambulatory Visit: Payer: Self-pay

## 2017-11-16 ENCOUNTER — Encounter: Payer: Self-pay | Admitting: Radiation Oncology

## 2017-11-16 VITALS — BP 141/69 | HR 67 | Temp 98.5°F | Resp 20 | Ht 61.0 in | Wt 123.4 lb

## 2017-11-16 DIAGNOSIS — C3411 Malignant neoplasm of upper lobe, right bronchus or lung: Secondary | ICD-10-CM | POA: Diagnosis not present

## 2017-11-16 DIAGNOSIS — G8918 Other acute postprocedural pain: Secondary | ICD-10-CM | POA: Diagnosis not present

## 2017-11-16 DIAGNOSIS — C3491 Malignant neoplasm of unspecified part of right bronchus or lung: Secondary | ICD-10-CM | POA: Insufficient documentation

## 2017-11-16 DIAGNOSIS — Z888 Allergy status to other drugs, medicaments and biological substances status: Secondary | ICD-10-CM | POA: Diagnosis not present

## 2017-11-16 DIAGNOSIS — Z7984 Long term (current) use of oral hypoglycemic drugs: Secondary | ICD-10-CM | POA: Diagnosis not present

## 2017-11-16 DIAGNOSIS — Z9221 Personal history of antineoplastic chemotherapy: Secondary | ICD-10-CM | POA: Insufficient documentation

## 2017-11-16 DIAGNOSIS — M542 Cervicalgia: Secondary | ICD-10-CM | POA: Diagnosis not present

## 2017-11-16 DIAGNOSIS — M545 Low back pain: Secondary | ICD-10-CM | POA: Diagnosis not present

## 2017-11-16 DIAGNOSIS — G893 Neoplasm related pain (acute) (chronic): Secondary | ICD-10-CM | POA: Diagnosis not present

## 2017-11-16 DIAGNOSIS — C3490 Malignant neoplasm of unspecified part of unspecified bronchus or lung: Secondary | ICD-10-CM

## 2017-11-16 DIAGNOSIS — Z79899 Other long term (current) drug therapy: Secondary | ICD-10-CM | POA: Insufficient documentation

## 2017-11-16 DIAGNOSIS — C7951 Secondary malignant neoplasm of bone: Principal | ICD-10-CM

## 2017-11-16 DIAGNOSIS — Z923 Personal history of irradiation: Secondary | ICD-10-CM | POA: Insufficient documentation

## 2017-11-16 DIAGNOSIS — Z885 Allergy status to narcotic agent status: Secondary | ICD-10-CM | POA: Diagnosis not present

## 2017-11-16 NOTE — Progress Notes (Signed)
Radiation Oncology         [phone] ________________________________  Name: KHALAYA MCGURN MRN: [id number]  Date: 11/16/2017  DOB: Sep 16, 1927  Re-evaluation Note  CC: Binnie Rail, MD  [name], MD    ICD-10-CM   1. Lung cancer metastatic to bone [facility]) C34.90    C79.51    Principle Diagnosis:  Stage IV adenocarcinoma of the right lung-mutation negative C6-7 left nerve root compression secondary to metastatic disease - PD-L1 (0%) /TMB of 9  Past Therapy: Status post radiation/chemotherapy for recurrence-completed October 2017 Status post right upper lobectomy in March 2013 for stage IB disease XRT to the cervical spine - started 04/02/2017   Current Therapy:        Tecentriq 1200 mg IV every 3 weeks s/p cycle 8 -- d/c progression Zometa 4 mg IV q3month Alimta/Avastin -- cycle #1 on 11/26/2017     Interval Since Last Radiation:course #2  6 months   Indication for treatment:  Palliative, post-op       Radiation treatment dates:   04/01/17-04/26/17  Site/dose:   Cervical spine / 35 Gy  delivered in 14 fractions  Beams/energy:   3D / 10X, 6X    Interval Since Last Radiation: course #1  1 year and 9 months -01/06/16-10/25/17by Dr. MTammi Klippelfor recurrent lung cancer, re-staged as recurrent IIA, T1b, N1, M0 adenocarcinoma of the right upper lung.  The primary tumor and involved mediastinal adenopathy were treated to 66 Gy in 33 fractions of 2 Gy.     Narrative:  The patient returns today for further evaluation. Since completion of her cervical spine radiation therapy is been under the close care of Dr. EMarin Olp She recently underwent a PET scan which unfortunately shows progression of multiple osseous metastasis. The patient is been having pain along the lower back and left pelvis area. Activity is noted in the L3 and left pelvis region and radiation therapy is been consulted for consideration for additional treatment. Patient is also had some  postoperative pain in the neck and upper chest. She does have a lesion at T3 but she has had previous high-dose radiation therapy to the upper chest area so  additional radiation therapy to this area the difficult given spinal cord tolerance doses                             ALLERGIES:  is allergic to tramadol; codeine; and demerol.  Meds: Current Outpatient Medications  Medication Sig Dispense Refill  . amitriptyline (ELAVIL) 10 MG tablet TAKE 1 TABLET BY MOUTH EVERYDAY AT BEDTIME 90 tablet 1  . bisoprolol-hydrochlorothiazide (ZIAC) 2.5-6.25 MG tablet TAKE 1 TABLET BY MOUTH EVERY DAY 90 tablet 0  . Blood Glucose Monitoring Suppl (ONE TOUCH ULTRA 2) w/Device KIT Use to check blood sugars twice a day Dx E11.9 1 each 0  . calcium carbonate (OS-CAL) 600 MG tablet Take 600 mg by mouth daily.    . cholecalciferol (VITAMIN D) 1000 units tablet Take 2,000 Units by mouth daily.     .Marland Kitchengabapentin (NEURONTIN) 100 MG capsule Take 100 mg by mouth 3 (three) times daily.     .Marland Kitchenglucose blood (ONE TOUCH ULTRA TEST) test strip 1 each by Other route 2 (two) times daily. Use to check blood sugars twice a day 100 each 3  . HYDROcodone-homatropine (HYCODAN) 5-1.5 MG/5ML syrup Take 5 mLs by mouth every 6 (six) hours as needed for cough. 120 mL 0  .  Lancets (ONETOUCH ULTRASOFT) lancets 1 each by Other route 2 (two) times daily. Use to help check blood sugars twice a day 100 each 3  . lovastatin (MEVACOR) 20 MG tablet TAKE 1 TABLET BY MOUTH EVERY OTHER DAY 90 tablet 1  . metFORMIN (GLUCOPHAGE) 500 MG tablet Take 1 tablet (500 mg total) by mouth 2 (two) times daily with a meal. 180 tablet 1  . methimazole (TAPAZOLE) 5 MG tablet Take 2.5 mg every  day 45 tablet 2  . Multiple Vitamins-Minerals (VISION-VITE PRESERVE PO) Take 1 tablet by mouth daily.     . Probiotic Product (ALIGN PO) Take by mouth daily.    . ramipril (ALTACE) 2.5 MG capsule TAKE 1 CAPSULE BY MOUTH EVERY DAY 90 capsule 1   No current  facility-administered medications for this encounter.     Physical Findings: The patient is in no acute distress. Patient is alert and oriented.  height is '5\' 1"'  (1.549 m) and weight is 123 lb 6.4 oz (56 kg). Her oral temperature is 98.5 F (36.9 C). Her blood pressure is 141/69 (abnormal) and her pulse is 67. Her respiration is 20 and oxygen saturation is 98%. .  Lungs are clear to auscultation bilaterally. Heart has regular rate and rhythm. No palpable cervical, supraclavicular, or axillary adenopathy. Abdomen soft, non-tender, normal bowel sounds. The neurological exam is nonfocal. Patient has some tenderness with palpation in the lower back and left pelvis region.  Lab Findings: Lab Results  Component Value Date   WBC 5.8 10/13/2017   HGB 12.9 10/13/2017   HCT 40.0 10/13/2017   MCV 91.3 10/13/2017   PLT 238 10/13/2017    Radiographic Findings: Nm Pet Image Restag (ps) Skull Base To Thigh  Result Date: 11/10/2017 CLINICAL DATA:  Subsequent treatment strategy for non-small cell lung cancer. EXAM: NUCLEAR MEDICINE PET SKULL BASE TO THIGH TECHNIQUE: 5.9 mCi F-18 FDG was injected intravenously. Full-ring PET imaging was performed from the skull base to thigh after the radiotracer. CT data was obtained and used for attenuation correction and anatomic localization. Fasting blood glucose: 95 mg/dl COMPARISON:  None. FINDINGS: Mediastinal blood pool activity: SUV max 2.7 NECK: No hypermetabolic lymph nodes in the neck. Incidental CT findings: none CHEST: Perihilar consolidation air bronchograms in the RIGHT suprahilar lung consistent with post radiation change. No significant metabolic activity. Incidental CT findings: Moderate RIGHT effusion is similar to comparison exam. Two small subpleural nodules in LEFT upper lobe measuring 3 mm each on image 13 and 24 series 8. ABDOMEN/PELVIS: No abnormal hypermetabolic activity within the liver, pancreas, adrenal glands, or spleen. No hypermetabolic lymph  nodes in the abdomen or pelvis. Incidental CT findings: Large benign peripherally calcified splenic cyst SKELETON: Several new hypermetabolic lesions in the skeleton. Additional lesions are increased in metabolic activity. For example lesion in the T3 vertebral body with SUV max equal 6.8 is increased from SUV max 3.2. New lesion in the L3 vertebral body with SUV max equal 7.9 wiht associated sclerosis on the CT. New lesion in the posterior LEFT iliac bone with SUV max equal 5.5. There is osseous sclerotic lesion at this level. New lesion in the sternum with SUV max equal 4.7 sclerosis additional lesion in the LEFT scapula is increased in metabolic activity SUV max equal 4.0 increased from 1.8. Incidental CT findings: Posterior thoracic fusion and anterior cervical fusion IMPRESSION: 1. Clear evidence of progression of skeletal metastasis with several new hypermetabolic lesions which correspond to new sclerotic lesions on comparison CT. Several additional lesions are  increased in metabolic activity from comparison exam. 2. No evidence of soft tissue metastasis. 3. Several small LEFT upper lobe pulmonary nodules favored benign. Recommend attention on follow-up. 4. Stable moderate RIGHT effusion. Electronically Signed   By: Suzy Bouchard M.D.   On: 11/10/2017 15:19    Impression:  Metastatic adenocarcinoma of the lung. The patient is symptomatic with her new osseous metastasis in the L3 and left medial pelvis area. She would be a good candidate for a short course of palliative radiation therapy directed at these areas. I discussed the general course of treatment side effects and potential toxicities of radiation therapy in this situation with the patient and her family. Patient does wish to proceed with planned course of treatment  Plan:  CT simulation later this week with treatments to begin early next week. Anticipate between 2 and 3 weeks of radiation therapy.  ____________________________________ Gery Pray, MD

## 2017-11-17 ENCOUNTER — Other Ambulatory Visit: Payer: Medicare Other

## 2017-11-17 ENCOUNTER — Ambulatory Visit: Payer: Medicare Other | Admitting: Hematology & Oncology

## 2017-11-18 ENCOUNTER — Ambulatory Visit
Admission: RE | Admit: 2017-11-18 | Discharge: 2017-11-18 | Disposition: A | Discharge status: Home / Self Care | Payer: Medicare Other | Source: Ambulatory Visit | Attending: Radiation Oncology | Admitting: Radiation Oncology

## 2017-11-18 DIAGNOSIS — C7951 Secondary malignant neoplasm of bone: Secondary | ICD-10-CM | POA: Insufficient documentation

## 2017-11-18 DIAGNOSIS — Z510 Encounter for antineoplastic radiation therapy: Secondary | ICD-10-CM | POA: Diagnosis not present

## 2017-11-18 DIAGNOSIS — C3490 Malignant neoplasm of unspecified part of unspecified bronchus or lung: Secondary | ICD-10-CM

## 2017-11-18 NOTE — Progress Notes (Signed)
  Radiation Oncology         [phone] ________________________________  Name: KELTY SZAFRAN MRN: [id number]  Date: 11/18/2017  DOB: 10/05/1927  SIMULATION AND TREATMENT PLANNING NOTE    ICD-10-CM   1. Lung cancer metastatic to bone [facility]) C34.90    C79.51     DIAGNOSIS:  Metastatic adenocarcinoma of the lung with painful osseous metastasis    NARRATIVE:  The patient was brought to the [place] suite.  Identity was confirmed.  All relevant records and images related to the planned course of therapy were reviewed.  The patient freely provided informed written consent to proceed with treatment after reviewing the details related to the planned course of therapy. The consent form was witnessed and verified by the simulation staff.  Then, the patient was set-up in a stable reproducible  supine position for radiation therapy.  CT images were obtained.  Surface markings were placed.  The CT images were loaded into the planning software.  Then the target and avoidance structures were contoured.  Treatment planning then occurred.  The radiation prescription was entered and confirmed.  Then, I designed and supervised the construction of a total of 5 medically necessary complex treatment devices.  I have requested : 3D Simulation  I have requested a DVH of the following structures: GTV, spinal cord, kidneys.  I have ordered:dose calc.  PLAN:  The patient will receive 35 Gy in 14 fractions directed at the lumbar spine and left medial pelvis region  -----------------------------------  Blair Promise, PhD, MD

## 2017-11-20 ENCOUNTER — Other Ambulatory Visit: Payer: Self-pay | Admitting: Internal Medicine

## 2017-11-22 DIAGNOSIS — C7951 Secondary malignant neoplasm of bone: Secondary | ICD-10-CM | POA: Diagnosis not present

## 2017-11-22 DIAGNOSIS — Z510 Encounter for antineoplastic radiation therapy: Secondary | ICD-10-CM | POA: Diagnosis not present

## 2017-11-23 ENCOUNTER — Ambulatory Visit
Admission: RE | Admit: 2017-11-23 | Discharge: 2017-11-23 | Disposition: A | Discharge status: Home / Self Care | Payer: Medicare Other | Source: Ambulatory Visit | Attending: Radiation Oncology | Admitting: Radiation Oncology

## 2017-11-23 DIAGNOSIS — Z510 Encounter for antineoplastic radiation therapy: Secondary | ICD-10-CM | POA: Diagnosis not present

## 2017-11-23 DIAGNOSIS — C7951 Secondary malignant neoplasm of bone: Principal | ICD-10-CM

## 2017-11-23 DIAGNOSIS — C3490 Malignant neoplasm of unspecified part of unspecified bronchus or lung: Secondary | ICD-10-CM

## 2017-11-23 NOTE — Progress Notes (Signed)
  Radiation Oncology         [phone] ________________________________  Name: Samantha Quinn MRN: [id number]  Date: 11/23/2017  DOB: 02-Feb-1928  Simulation Verification Note    ICD-10-CM   1. Lung cancer metastatic to bone [facility]) C34.90    C79.51     Status: outpatient  NARRATIVE: The patient was brought to the treatment unit and placed in the planned treatment position. The clinical setup was verified. Then port films were obtained and uploaded to the radiation oncology medical record software.  The treatment beams were carefully compared against the planned radiation fields. The position location and shape of the radiation fields was reviewed. They targeted volume of tissue appears to be appropriately covered by the radiation beams. Organs at risk appear to be excluded as planned.  Based on my personal review, I approved the simulation verification. The patient's treatment will proceed as planned.  -----------------------------------  Blair Promise, PhD, MD

## 2017-11-24 ENCOUNTER — Ambulatory Visit
Admission: RE | Admit: 2017-11-24 | Discharge: 2017-11-24 | Disposition: A | Discharge status: Home / Self Care | Payer: Medicare Other | Source: Ambulatory Visit | Attending: Radiation Oncology | Admitting: Radiation Oncology

## 2017-11-24 DIAGNOSIS — Z510 Encounter for antineoplastic radiation therapy: Secondary | ICD-10-CM | POA: Diagnosis not present

## 2017-11-24 DIAGNOSIS — C7951 Secondary malignant neoplasm of bone: Secondary | ICD-10-CM | POA: Diagnosis not present

## 2017-11-25 ENCOUNTER — Ambulatory Visit
Admission: RE | Admit: 2017-11-25 | Discharge: 2017-11-25 | Disposition: A | Discharge status: Home / Self Care | Payer: Medicare Other | Source: Ambulatory Visit | Attending: Radiation Oncology | Admitting: Radiation Oncology

## 2017-11-25 DIAGNOSIS — C3491 Malignant neoplasm of unspecified part of right bronchus or lung: Secondary | ICD-10-CM | POA: Diagnosis not present

## 2017-11-25 DIAGNOSIS — Z7951 Long term (current) use of inhaled steroids: Secondary | ICD-10-CM | POA: Insufficient documentation

## 2017-11-25 DIAGNOSIS — Z510 Encounter for antineoplastic radiation therapy: Secondary | ICD-10-CM | POA: Insufficient documentation

## 2017-11-25 DIAGNOSIS — C7951 Secondary malignant neoplasm of bone: Secondary | ICD-10-CM | POA: Diagnosis not present

## 2017-11-26 ENCOUNTER — Ambulatory Visit
Admission: RE | Admit: 2017-11-26 | Discharge: 2017-11-26 | Disposition: A | Discharge status: Home / Self Care | Payer: Medicare Other | Source: Ambulatory Visit | Attending: Radiation Oncology | Admitting: Radiation Oncology

## 2017-11-26 DIAGNOSIS — Z510 Encounter for antineoplastic radiation therapy: Secondary | ICD-10-CM | POA: Diagnosis not present

## 2017-11-26 DIAGNOSIS — C7951 Secondary malignant neoplasm of bone: Secondary | ICD-10-CM | POA: Diagnosis not present

## 2017-11-26 DIAGNOSIS — C3491 Malignant neoplasm of unspecified part of right bronchus or lung: Secondary | ICD-10-CM | POA: Diagnosis not present

## 2017-11-26 DIAGNOSIS — Z7951 Long term (current) use of inhaled steroids: Secondary | ICD-10-CM | POA: Diagnosis not present

## 2017-11-29 ENCOUNTER — Ambulatory Visit
Admission: RE | Admit: 2017-11-29 | Discharge: 2017-11-29 | Disposition: A | Discharge status: Home / Self Care | Payer: Medicare Other | Source: Ambulatory Visit | Attending: Radiation Oncology | Admitting: Radiation Oncology

## 2017-11-29 DIAGNOSIS — Z510 Encounter for antineoplastic radiation therapy: Secondary | ICD-10-CM | POA: Diagnosis not present

## 2017-11-29 DIAGNOSIS — C7951 Secondary malignant neoplasm of bone: Secondary | ICD-10-CM | POA: Diagnosis not present

## 2017-11-29 DIAGNOSIS — C3491 Malignant neoplasm of unspecified part of right bronchus or lung: Secondary | ICD-10-CM | POA: Diagnosis not present

## 2017-11-29 DIAGNOSIS — Z7951 Long term (current) use of inhaled steroids: Secondary | ICD-10-CM | POA: Diagnosis not present

## 2017-11-30 ENCOUNTER — Ambulatory Visit
Admission: RE | Admit: 2017-11-30 | Discharge: 2017-11-30 | Disposition: A | Discharge status: Home / Self Care | Payer: Medicare Other | Source: Ambulatory Visit | Attending: Radiation Oncology | Admitting: Radiation Oncology

## 2017-11-30 DIAGNOSIS — C7951 Secondary malignant neoplasm of bone: Secondary | ICD-10-CM | POA: Diagnosis not present

## 2017-11-30 DIAGNOSIS — C3491 Malignant neoplasm of unspecified part of right bronchus or lung: Secondary | ICD-10-CM | POA: Diagnosis not present

## 2017-11-30 DIAGNOSIS — Z510 Encounter for antineoplastic radiation therapy: Secondary | ICD-10-CM | POA: Diagnosis not present

## 2017-11-30 DIAGNOSIS — Z7951 Long term (current) use of inhaled steroids: Secondary | ICD-10-CM | POA: Diagnosis not present

## 2017-12-01 ENCOUNTER — Ambulatory Visit
Admission: RE | Admit: 2017-12-01 | Discharge: 2017-12-01 | Disposition: A | Discharge status: Home / Self Care | Payer: Medicare Other | Source: Ambulatory Visit | Attending: Radiation Oncology | Admitting: Radiation Oncology

## 2017-12-01 DIAGNOSIS — C3491 Malignant neoplasm of unspecified part of right bronchus or lung: Secondary | ICD-10-CM | POA: Diagnosis not present

## 2017-12-01 DIAGNOSIS — Z7951 Long term (current) use of inhaled steroids: Secondary | ICD-10-CM | POA: Diagnosis not present

## 2017-12-01 DIAGNOSIS — C7951 Secondary malignant neoplasm of bone: Secondary | ICD-10-CM | POA: Diagnosis not present

## 2017-12-01 DIAGNOSIS — Z510 Encounter for antineoplastic radiation therapy: Secondary | ICD-10-CM | POA: Diagnosis not present

## 2017-12-02 ENCOUNTER — Telehealth: Payer: Self-pay | Admitting: Hematology & Oncology

## 2017-12-02 ENCOUNTER — Ambulatory Visit
Admission: RE | Admit: 2017-12-02 | Discharge: 2017-12-02 | Disposition: A | Discharge status: Home / Self Care | Payer: Medicare Other | Source: Ambulatory Visit | Attending: Radiation Oncology | Admitting: Radiation Oncology

## 2017-12-02 DIAGNOSIS — C7951 Secondary malignant neoplasm of bone: Secondary | ICD-10-CM | POA: Diagnosis not present

## 2017-12-02 DIAGNOSIS — C3491 Malignant neoplasm of unspecified part of right bronchus or lung: Secondary | ICD-10-CM | POA: Diagnosis not present

## 2017-12-02 DIAGNOSIS — Z510 Encounter for antineoplastic radiation therapy: Secondary | ICD-10-CM | POA: Diagnosis not present

## 2017-12-02 DIAGNOSIS — Z7951 Long term (current) use of inhaled steroids: Secondary | ICD-10-CM | POA: Diagnosis not present

## 2017-12-02 NOTE — Telephone Encounter (Signed)
lmom to inform pt of lab/md appointment 8/22 at 3 pm per sch msg

## 2017-12-03 ENCOUNTER — Ambulatory Visit
Admission: RE | Admit: 2017-12-03 | Discharge: 2017-12-03 | Disposition: A | Discharge status: Home / Self Care | Payer: Medicare Other | Source: Ambulatory Visit | Attending: Radiation Oncology | Admitting: Radiation Oncology

## 2017-12-03 DIAGNOSIS — C7951 Secondary malignant neoplasm of bone: Secondary | ICD-10-CM | POA: Diagnosis not present

## 2017-12-03 DIAGNOSIS — Z7951 Long term (current) use of inhaled steroids: Secondary | ICD-10-CM | POA: Diagnosis not present

## 2017-12-03 DIAGNOSIS — Z510 Encounter for antineoplastic radiation therapy: Secondary | ICD-10-CM | POA: Diagnosis not present

## 2017-12-03 DIAGNOSIS — C3491 Malignant neoplasm of unspecified part of right bronchus or lung: Secondary | ICD-10-CM | POA: Diagnosis not present

## 2017-12-06 ENCOUNTER — Ambulatory Visit
Admission: RE | Admit: 2017-12-06 | Discharge: 2017-12-06 | Disposition: A | Discharge status: Home / Self Care | Payer: Medicare Other | Source: Ambulatory Visit | Attending: Radiation Oncology | Admitting: Radiation Oncology

## 2017-12-06 DIAGNOSIS — H35033 Hypertensive retinopathy, bilateral: Secondary | ICD-10-CM | POA: Diagnosis not present

## 2017-12-06 DIAGNOSIS — C7951 Secondary malignant neoplasm of bone: Secondary | ICD-10-CM | POA: Diagnosis not present

## 2017-12-06 DIAGNOSIS — C3491 Malignant neoplasm of unspecified part of right bronchus or lung: Secondary | ICD-10-CM | POA: Diagnosis not present

## 2017-12-06 DIAGNOSIS — H353132 Nonexudative age-related macular degeneration, bilateral, intermediate dry stage: Secondary | ICD-10-CM | POA: Diagnosis not present

## 2017-12-06 DIAGNOSIS — Z510 Encounter for antineoplastic radiation therapy: Secondary | ICD-10-CM | POA: Diagnosis not present

## 2017-12-06 DIAGNOSIS — E113593 Type 2 diabetes mellitus with proliferative diabetic retinopathy without macular edema, bilateral: Secondary | ICD-10-CM | POA: Diagnosis not present

## 2017-12-06 DIAGNOSIS — Z961 Presence of intraocular lens: Secondary | ICD-10-CM | POA: Diagnosis not present

## 2017-12-06 DIAGNOSIS — Z7951 Long term (current) use of inhaled steroids: Secondary | ICD-10-CM | POA: Diagnosis not present

## 2017-12-07 ENCOUNTER — Ambulatory Visit
Admission: RE | Admit: 2017-12-07 | Discharge: 2017-12-07 | Disposition: A | Discharge status: Home / Self Care | Payer: Medicare Other | Source: Ambulatory Visit | Attending: Radiation Oncology | Admitting: Radiation Oncology

## 2017-12-07 DIAGNOSIS — C7951 Secondary malignant neoplasm of bone: Secondary | ICD-10-CM | POA: Diagnosis not present

## 2017-12-07 DIAGNOSIS — C3491 Malignant neoplasm of unspecified part of right bronchus or lung: Secondary | ICD-10-CM | POA: Diagnosis not present

## 2017-12-07 DIAGNOSIS — Z7951 Long term (current) use of inhaled steroids: Secondary | ICD-10-CM | POA: Diagnosis not present

## 2017-12-07 DIAGNOSIS — Z510 Encounter for antineoplastic radiation therapy: Secondary | ICD-10-CM | POA: Diagnosis not present

## 2017-12-08 ENCOUNTER — Ambulatory Visit
Admission: RE | Admit: 2017-12-08 | Discharge: 2017-12-08 | Disposition: A | Discharge status: Home / Self Care | Payer: Medicare Other | Source: Ambulatory Visit | Attending: Radiation Oncology | Admitting: Radiation Oncology

## 2017-12-08 DIAGNOSIS — Z510 Encounter for antineoplastic radiation therapy: Secondary | ICD-10-CM | POA: Diagnosis not present

## 2017-12-08 DIAGNOSIS — C3491 Malignant neoplasm of unspecified part of right bronchus or lung: Secondary | ICD-10-CM | POA: Diagnosis not present

## 2017-12-08 DIAGNOSIS — Z7951 Long term (current) use of inhaled steroids: Secondary | ICD-10-CM | POA: Diagnosis not present

## 2017-12-08 DIAGNOSIS — C7951 Secondary malignant neoplasm of bone: Secondary | ICD-10-CM | POA: Diagnosis not present

## 2017-12-09 ENCOUNTER — Ambulatory Visit
Admission: RE | Admit: 2017-12-09 | Discharge: 2017-12-09 | Disposition: A | Discharge status: Home / Self Care | Payer: Medicare Other | Source: Ambulatory Visit | Attending: Radiation Oncology | Admitting: Radiation Oncology

## 2017-12-09 DIAGNOSIS — C7951 Secondary malignant neoplasm of bone: Secondary | ICD-10-CM | POA: Diagnosis not present

## 2017-12-09 DIAGNOSIS — Z510 Encounter for antineoplastic radiation therapy: Secondary | ICD-10-CM | POA: Diagnosis not present

## 2017-12-09 DIAGNOSIS — C3491 Malignant neoplasm of unspecified part of right bronchus or lung: Secondary | ICD-10-CM | POA: Diagnosis not present

## 2017-12-09 DIAGNOSIS — Z7951 Long term (current) use of inhaled steroids: Secondary | ICD-10-CM | POA: Diagnosis not present

## 2017-12-10 ENCOUNTER — Ambulatory Visit
Admission: RE | Admit: 2017-12-10 | Discharge: 2017-12-10 | Disposition: A | Discharge status: Home / Self Care | Payer: Medicare Other | Source: Ambulatory Visit | Attending: Radiation Oncology | Admitting: Radiation Oncology

## 2017-12-10 DIAGNOSIS — C3491 Malignant neoplasm of unspecified part of right bronchus or lung: Secondary | ICD-10-CM | POA: Diagnosis not present

## 2017-12-10 DIAGNOSIS — Z510 Encounter for antineoplastic radiation therapy: Secondary | ICD-10-CM | POA: Diagnosis not present

## 2017-12-10 DIAGNOSIS — Z7951 Long term (current) use of inhaled steroids: Secondary | ICD-10-CM | POA: Diagnosis not present

## 2017-12-10 DIAGNOSIS — C7951 Secondary malignant neoplasm of bone: Secondary | ICD-10-CM | POA: Diagnosis not present

## 2017-12-12 ENCOUNTER — Ambulatory Visit: Admission: RE | Admit: 2017-12-12 | Payer: Medicare Other | Source: Ambulatory Visit

## 2017-12-13 ENCOUNTER — Other Ambulatory Visit: Payer: Self-pay | Admitting: Internal Medicine

## 2017-12-16 ENCOUNTER — Other Ambulatory Visit: Payer: Self-pay

## 2017-12-16 ENCOUNTER — Encounter: Payer: Self-pay | Admitting: Hematology & Oncology

## 2017-12-16 ENCOUNTER — Inpatient Hospital Stay: Payer: Medicare Other | Attending: Hematology & Oncology | Admitting: Hematology & Oncology

## 2017-12-16 ENCOUNTER — Inpatient Hospital Stay: Payer: Medicare Other

## 2017-12-16 VITALS — BP 124/64 | HR 75 | Temp 98.2°F | Resp 18 | Wt 120.0 lb

## 2017-12-16 DIAGNOSIS — C7951 Secondary malignant neoplasm of bone: Secondary | ICD-10-CM

## 2017-12-16 DIAGNOSIS — Z7189 Other specified counseling: Secondary | ICD-10-CM

## 2017-12-16 DIAGNOSIS — Z79899 Other long term (current) drug therapy: Secondary | ICD-10-CM | POA: Diagnosis not present

## 2017-12-16 DIAGNOSIS — Z7984 Long term (current) use of oral hypoglycemic drugs: Secondary | ICD-10-CM | POA: Diagnosis not present

## 2017-12-16 DIAGNOSIS — Z923 Personal history of irradiation: Secondary | ICD-10-CM | POA: Diagnosis not present

## 2017-12-16 DIAGNOSIS — E119 Type 2 diabetes mellitus without complications: Secondary | ICD-10-CM | POA: Insufficient documentation

## 2017-12-16 DIAGNOSIS — C3431 Malignant neoplasm of lower lobe, right bronchus or lung: Secondary | ICD-10-CM | POA: Insufficient documentation

## 2017-12-16 LAB — CBC WITH DIFFERENTIAL (CANCER CENTER ONLY)
Basophils Absolute: 0 10*3/uL (ref 0.0–0.1)
Basophils Relative: 0 %
EOS ABS: 0.2 10*3/uL (ref 0.0–0.5)
Eosinophils Relative: 3 %
HEMATOCRIT: 37.7 % (ref 34.8–46.6)
HEMOGLOBIN: 12.4 g/dL (ref 11.6–15.9)
LYMPHS ABS: 0.2 10*3/uL — AB (ref 0.9–3.3)
Lymphocytes Relative: 4 %
MCH: 29.3 pg (ref 26.0–34.0)
MCHC: 32.9 g/dL (ref 32.0–36.0)
MCV: 89.1 fL (ref 81.0–101.0)
MONOS PCT: 7 %
Monocytes Absolute: 0.4 10*3/uL (ref 0.1–0.9)
NEUTROS PCT: 86 %
Neutro Abs: 5.3 10*3/uL (ref 1.5–6.5)
Platelet Count: 159 10*3/uL (ref 145–400)
RBC: 4.23 MIL/uL (ref 3.70–5.32)
RDW: 13.5 % (ref 11.1–15.7)
WBC Count: 6.1 10*3/uL (ref 3.9–10.0)

## 2017-12-16 LAB — CMP (CANCER CENTER ONLY)
ALBUMIN: 3.2 g/dL — AB (ref 3.5–5.0)
ALT: 16 U/L (ref 10–47)
AST: 18 U/L (ref 11–38)
Alkaline Phosphatase: 61 U/L (ref 26–84)
Anion gap: 5 (ref 5–15)
BILIRUBIN TOTAL: 0.5 mg/dL (ref 0.2–1.6)
BUN: 15 mg/dL (ref 7–22)
CHLORIDE: 102 mmol/L (ref 98–108)
CO2: 30 mmol/L (ref 18–33)
CREATININE: 0.7 mg/dL (ref 0.60–1.20)
Calcium: 8.8 mg/dL (ref 8.0–10.3)
Glucose, Bld: 128 mg/dL — ABNORMAL HIGH (ref 73–118)
Potassium: 3.7 mmol/L (ref 3.3–4.7)
SODIUM: 137 mmol/L (ref 128–145)
Total Protein: 6.3 g/dL — ABNORMAL LOW (ref 6.4–8.1)

## 2017-12-17 NOTE — Progress Notes (Signed)
Hematology and Oncology Follow Up Visit  ELYSSE Quinn [id number] 06/26/1927 84 y.o. 12/17/2017   Principle Diagnosis:  Stage IV adenocarcinoma of the right lung-mutation negative C6-7 left nerve root compression secondary to metastatic disease - PD-L1 (0%) /TMB of 9  Past Therapy: Status post radiation/chemotherapy for recurrence-completed October 2017 Status post right upper lobectomy in March 2013 for stage IB disease XRT to the cervical spine - started 04/02/2017   Current Therapy:   Tecentriq 1200 mg IV every 3 weeks s/p cycle 8 -- d/c progression Zometa 4 mg IV q 2 months --on hold until November 2019 Alimta/Avastin -- cycle #1 on 12/30/2017   Interim History:  Samantha Quinn is here today for follow-up.  She has she looks pretty good.  She got through radiation without a lot of problems.  She completed radiation about a week ago.  We had talked last time she was here about using chemotherapy with Avastin.  I think that she could tolerate single agent chemotherapy with Alimta and Avastin.  I think our goal is to try to help control her tumor and cancer so that she does not have side effects.  There is been no problems with nausea or vomiting.  She has had no problems with bowels or bladder.  There is been no cough.  She is had no shortness of breath.  She has had no leg swelling.  Overall, I would have to say that her performance status is probably ECOG 1.  I forgot to mention that she has been having some tooth issues.  I think she had a tooth pulled.  We are holding the Zometa for right now.    Medications:  Allergies as of 12/16/2017      Reactions   Tramadol Other (See Comments)   syncope   Codeine Nausea And Vomiting   Demerol Nausea Only      Medication List        Accurate as of 12/16/17 11:59 PM. Always use your most recent med list.          ALIGN PO Take by mouth daily.   amitriptyline 10 MG tablet Commonly known as:  ELAVIL TAKE 1 TABLET BY MOUTH  EVERYDAY AT BEDTIME   bisoprolol-hydrochlorothiazide 2.5-6.25 MG tablet Commonly known as:  ZIAC TAKE 1 TABLET BY MOUTH EVERY DAY   calcium carbonate 600 MG tablet Commonly known as:  OS-CAL Take 600 mg by mouth daily.   cholecalciferol 1000 units tablet Commonly known as:  VITAMIN D Take 2,000 Units by mouth daily.   gabapentin 100 MG capsule Commonly known as:  NEURONTIN Take 100 mg by mouth 3 (three) times daily.   glucose blood test strip 1 each by Other route 2 (two) times daily. Use to check blood sugars twice a day   HYDROcodone-homatropine 5-1.5 MG/5ML syrup Commonly known as:  HYCODAN Take 5 mLs by mouth every 6 (six) hours as needed for cough.   lovastatin 20 MG tablet Commonly known as:  MEVACOR TAKE 1 TABLET BY MOUTH EVERY OTHER DAY   metFORMIN 500 MG tablet Commonly known as:  GLUCOPHAGE Take 1 tablet (500 mg total) by mouth 2 (two) times daily with a meal.   methimazole 5 MG tablet Commonly known as:  TAPAZOLE Take 2.5 mg every  day   ONE TOUCH ULTRA 2 w/Device Kit Use to check blood sugars twice a day Dx E11.9   onetouch ultrasoft lancets 1 each by Other route 2 (two) times daily. Use to help check  blood sugars twice a day   ramipril 2.5 MG capsule Commonly known as:  ALTACE TAKE 1 CAPSULE BY MOUTH EVERY DAY   VISION-VITE PRESERVE PO Take 1 tablet by mouth daily.       Allergies:  Allergies  Allergen Reactions  . Tramadol Other (See Comments)    syncope  . Codeine Nausea And Vomiting  . Demerol Nausea Only    Past Medical History, Surgical history, Social history, and Family History were reviewed and updated.  Review of Systems: Review of Systems  Constitutional: Negative.   HENT: Negative.   Eyes: Negative.   Respiratory: Negative.   Cardiovascular: Negative.   Gastrointestinal: Negative.   Genitourinary: Negative.   Musculoskeletal: Positive for back pain and joint pain.  Skin: Negative.   Neurological: Negative.     Endo/Heme/Allergies: Negative.   Psychiatric/Behavioral: Negative.       Physical Exam:  weight is 120 lb (54.4 kg). Her oral temperature is 98.2 F (36.8 C). Her blood pressure is 124/64 and her pulse is 75. Her respiration is 18 and oxygen saturation is 100%.   Wt Readings from Last 3 Encounters:  12/16/17 120 lb (54.4 kg)  11/16/17 123 lb 6.4 oz (56 kg)  11/12/17 120 lb (54.4 kg)    Physical Exam  Constitutional: She is oriented to person, place, and time.  HENT:  Head: Normocephalic and atraumatic.  Mouth/Throat: Oropharynx is clear and moist.  Eyes: Pupils are equal, round, and reactive to light. EOM are normal.  Neck: Normal range of motion.  Cardiovascular: Normal rate, regular rhythm and normal heart sounds.  Pulmonary/Chest: Effort normal and breath sounds normal.  Abdominal: Soft. Bowel sounds are normal.  Musculoskeletal: Normal range of motion. She exhibits no edema, tenderness or deformity.  Lymphadenopathy:    She has no cervical adenopathy.  Neurological: She is alert and oriented to person, place, and time.  Skin: Skin is warm and dry. No rash noted. No erythema.  Psychiatric: She has a normal mood and affect. Her behavior is normal. Judgment and thought content normal.  Vitals reviewed.    Lab Results  Component Value Date   WBC 6.1 12/16/2017   HGB 12.4 12/16/2017   HCT 37.7 12/16/2017   MCV 89.1 12/16/2017   PLT 159 12/16/2017   Lab Results  Component Value Date   FERRITIN 514 (H) 04/07/2017   IRON 61 04/07/2017   TIBC 299 04/07/2017   UIBC 238 04/07/2017   IRONPCTSAT 20 (L) 04/07/2017   Lab Results  Component Value Date   RBC 4.23 12/16/2017   No results found for: KPAFRELGTCHN, LAMBDASER, KAPLAMBRATIO No results found for: IGGSERUM, IGA, IGMSERUM No results found for: Odetta Pink, SPEI   Chemistry      Component Value Date/Time   NA 137 12/16/2017 1505   NA 144 04/07/2017 1215    NA 135 (L) 03/11/2017 1416   K 3.7 12/16/2017 1505   K 4.0 04/07/2017 1215   K 3.9 03/11/2017 1416   CL 102 12/16/2017 1505   CL 103 04/07/2017 1215   CO2 30 12/16/2017 1505   CO2 29 04/07/2017 1215   CO2 23 03/11/2017 1416   BUN 15 12/16/2017 1505   BUN 13 04/07/2017 1215   BUN 16.1 03/11/2017 1416   CREATININE 0.70 12/16/2017 1505   CREATININE 0.8 04/07/2017 1215   CREATININE 0.9 03/11/2017 1416      Component Value Date/Time   CALCIUM 8.8 12/16/2017 1505   CALCIUM 9.6  04/07/2017 1215   CALCIUM 9.6 03/11/2017 1416   ALKPHOS 61 12/16/2017 1505   ALKPHOS 60 04/07/2017 1215   ALKPHOS 79 03/11/2017 1416   AST 18 12/16/2017 1505   AST 11 03/11/2017 1416   ALT 16 12/16/2017 1505   ALT 15 04/07/2017 1215   ALT 11 03/11/2017 1416   BILITOT 0.5 12/16/2017 1505   BILITOT 0.26 03/11/2017 1416      Impression and Plan: Samantha Quinn is a pleasant 84 yo caucasian female with stage IV progressive non small cell adenocarcinoma of the lung.   Now that she is finished radiation therapy we can think about chemotherapy for her.  Again, we have to keep him on that she is 84 years old and that we have a disease that is not curable.  She and I talked about this.  We spent about 40 minutes with her.  All the time was spent face-to-face with her.  I helped counsel her and help coordinate future care.  We we will start the week of Labor Day.  I want her to be off from radiation for a couple weeks before we do anything else with her.  I would probably give her 3 cycles of treatment and then repeat a PET scan to see how she is responding.    I will plan to see her back when she has her first treatment just to make sure that I answer any questions that she may have or that her family may have.    [name], MD 8/23/20195:31 PM

## 2017-12-20 ENCOUNTER — Encounter: Payer: Self-pay | Admitting: Radiation Oncology

## 2017-12-20 DIAGNOSIS — M542 Cervicalgia: Secondary | ICD-10-CM | POA: Diagnosis not present

## 2017-12-20 NOTE — Progress Notes (Signed)
  Radiation Oncology         [phone] ________________________________  Name: [name] MRN: [id number]  Date: 12/20/2017  DOB: 10/22/1927  End of Treatment Note  Diagnosis:   Stage IV adenocarcinoma of the right lung-mutation negative C6-7 left nerve root compression secondary to metastatic disease - PD-L1 (0%) /TMB of 9     Indication for treatment:  Palliative       Radiation treatment dates:   11/23/2017-12/10/2017  Site/dose:   1. Lumbar spine, 2.5 Gy in 14 fractions for a total dose of 35 Gy           2. Left pelvis, 2.5 Gy in 14 fractions for a total dose of 35 Gy  Beams/energy:   1. 3D, 15X         2, 3D, 15X  Narrative: The patient tolerated radiation treatment relatively well. At the beginning of the treatment, patient was without complaints and was given Sonafine and instructed on its use. Towards the end of the treatments, the patient noted she reported continued abdominal discomfort, mild nausea, and mild skin discomfort to mid back. She denied vomiting, diarrhea, pain with voiding, blood/discharge with voiding. On PE, it was noted mild erythema on lumbar back with small scabs.   Plan: The patient has completed radiation treatment. The patient will return to radiation oncology clinic for routine followup in one month. I advised them to call or return sooner if they have any questions or concerns related to their recovery or treatment.  -----------------------------------  Blair Promise, PhD, MD  This document serves as a record of services personally performed by Gery Pray, MD. It was created on his behalf by [facility], a trained medical scribe. The creation of this record is based on the scribe's personal observations and the provider's statements to them. This document has been checked and approved by the attending provider.

## 2017-12-30 ENCOUNTER — Other Ambulatory Visit: Payer: Self-pay | Admitting: *Deleted

## 2017-12-30 ENCOUNTER — Other Ambulatory Visit: Payer: Self-pay

## 2017-12-30 ENCOUNTER — Inpatient Hospital Stay: Payer: Medicare Other | Attending: Hematology & Oncology | Admitting: Hematology & Oncology

## 2017-12-30 ENCOUNTER — Inpatient Hospital Stay: Payer: Medicare Other

## 2017-12-30 ENCOUNTER — Encounter: Payer: Self-pay | Admitting: Hematology & Oncology

## 2017-12-30 VITALS — BP 115/55 | HR 75 | Temp 97.7°F | Resp 16 | Wt 120.0 lb

## 2017-12-30 DIAGNOSIS — C3431 Malignant neoplasm of lower lobe, right bronchus or lung: Secondary | ICD-10-CM

## 2017-12-30 DIAGNOSIS — M2550 Pain in unspecified joint: Secondary | ICD-10-CM

## 2017-12-30 DIAGNOSIS — R05 Cough: Secondary | ICD-10-CM | POA: Insufficient documentation

## 2017-12-30 DIAGNOSIS — Z7984 Long term (current) use of oral hypoglycemic drugs: Secondary | ICD-10-CM | POA: Diagnosis not present

## 2017-12-30 DIAGNOSIS — M549 Dorsalgia, unspecified: Secondary | ICD-10-CM | POA: Diagnosis not present

## 2017-12-30 DIAGNOSIS — Z79899 Other long term (current) drug therapy: Secondary | ICD-10-CM

## 2017-12-30 DIAGNOSIS — E119 Type 2 diabetes mellitus without complications: Secondary | ICD-10-CM | POA: Insufficient documentation

## 2017-12-30 DIAGNOSIS — Z923 Personal history of irradiation: Secondary | ICD-10-CM | POA: Insufficient documentation

## 2017-12-30 DIAGNOSIS — Z5112 Encounter for antineoplastic immunotherapy: Secondary | ICD-10-CM | POA: Insufficient documentation

## 2017-12-30 DIAGNOSIS — C7951 Secondary malignant neoplasm of bone: Secondary | ICD-10-CM | POA: Diagnosis not present

## 2017-12-30 DIAGNOSIS — Z23 Encounter for immunization: Secondary | ICD-10-CM | POA: Diagnosis not present

## 2017-12-30 DIAGNOSIS — Z5111 Encounter for antineoplastic chemotherapy: Secondary | ICD-10-CM | POA: Insufficient documentation

## 2017-12-30 LAB — CBC WITH DIFFERENTIAL/PLATELET
Basophils Absolute: 0 10*3/uL (ref 0.0–0.1)
Basophils Relative: 0 %
EOS ABS: 0.1 10*3/uL (ref 0.0–0.7)
Eosinophils Relative: 2 %
HEMATOCRIT: 37 % (ref 36.0–46.0)
Hemoglobin: 12.3 g/dL (ref 12.0–15.0)
Lymphocytes Relative: 6 %
Lymphs Abs: 0.3 10*3/uL — ABNORMAL LOW (ref 0.7–4.0)
MCH: 29.5 pg (ref 26.0–34.0)
MCHC: 33.2 g/dL (ref 30.0–36.0)
MCV: 88.7 fL (ref 78.0–100.0)
Monocytes Absolute: 0.5 10*3/uL (ref 0.1–1.0)
Monocytes Relative: 8 %
NEUTROS PCT: 84 %
Neutro Abs: 5 10*3/uL (ref 1.7–7.7)
Platelets: 229 10*3/uL (ref 150–400)
RBC: 4.17 MIL/uL (ref 3.87–5.11)
RDW: 14.2 % (ref 11.5–15.5)
WBC: 5.9 10*3/uL (ref 4.0–10.5)

## 2017-12-30 LAB — CMP (CANCER CENTER ONLY)
ALBUMIN: 3.3 g/dL — AB (ref 3.5–5.0)
ALT: 16 U/L (ref 10–47)
AST: 21 U/L (ref 11–38)
Alkaline Phosphatase: 73 U/L (ref 26–84)
Anion gap: 3 — ABNORMAL LOW (ref 5–15)
BILIRUBIN TOTAL: 0.6 mg/dL (ref 0.2–1.6)
BUN: 14 mg/dL (ref 7–22)
CHLORIDE: 105 mmol/L (ref 98–108)
CO2: 28 mmol/L (ref 18–33)
CREATININE: 0.6 mg/dL (ref 0.60–1.20)
Calcium: 9.4 mg/dL (ref 8.0–10.3)
Glucose, Bld: 127 mg/dL — ABNORMAL HIGH (ref 73–118)
Potassium: 4 mmol/L (ref 3.3–4.7)
SODIUM: 136 mmol/L (ref 128–145)
Total Protein: 6.4 g/dL (ref 6.4–8.1)

## 2017-12-30 LAB — TOTAL PROTEIN, URINE DIPSTICK: Protein, ur: NEGATIVE mg/dL

## 2017-12-30 MED ORDER — PROCHLORPERAZINE MALEATE 10 MG PO TABS
10.0000 mg | ORAL_TABLET | Freq: Once | ORAL | Status: AC
  Administered 2017-12-30: 10 mg via ORAL

## 2017-12-30 MED ORDER — SODIUM CHLORIDE 0.9 % IV SOLN
Freq: Once | INTRAVENOUS | Status: AC
  Administered 2017-12-30: 13:00:00 via INTRAVENOUS
  Filled 2017-12-30: qty 250

## 2017-12-30 MED ORDER — DEXAMETHASONE 4 MG PO TABS: ORAL_TABLET | ORAL | 1 refills | Status: DC

## 2017-12-30 MED ORDER — FOLIC ACID 1 MG PO TABS: 1.0000 mg | ORAL_TABLET | Freq: Every day | ORAL | 3 refills | Status: DC

## 2017-12-30 MED ORDER — SODIUM CHLORIDE 0.9 % IV SOLN
400.0000 mg/m2 | Freq: Once | INTRAVENOUS | Status: AC
  Administered 2017-12-30: 600 mg via INTRAVENOUS
  Filled 2017-12-30: qty 24

## 2017-12-30 MED ORDER — ONDANSETRON HCL 8 MG PO TABS: 8.0000 mg | ORAL_TABLET | Freq: Two times a day (BID) | ORAL | 1 refills | Status: DC | PRN

## 2017-12-30 MED ORDER — CYANOCOBALAMIN 1000 MCG/ML IJ SOLN
INTRAMUSCULAR | Status: AC
  Filled 2017-12-30: qty 1

## 2017-12-30 MED ORDER — PROCHLORPERAZINE MALEATE 10 MG PO TABS
ORAL_TABLET | ORAL | Status: AC
  Filled 2017-12-30: qty 1

## 2017-12-30 MED ORDER — PROCHLORPERAZINE MALEATE 10 MG PO TABS: 10.0000 mg | ORAL_TABLET | Freq: Four times a day (QID) | ORAL | 1 refills | Status: DC | PRN

## 2017-12-30 MED ORDER — SODIUM CHLORIDE 0.9 % IV SOLN
14.5000 mg/kg | Freq: Once | INTRAVENOUS | Status: AC
  Administered 2017-12-30: 800 mg via INTRAVENOUS
  Filled 2017-12-30: qty 32

## 2017-12-30 MED ORDER — CYANOCOBALAMIN 1000 MCG/ML IJ SOLN
1000.0000 ug | Freq: Once | INTRAMUSCULAR | Status: AC
  Administered 2017-12-30: 1000 ug via INTRAMUSCULAR

## 2017-12-30 MED ORDER — LORAZEPAM 0.5 MG PO TABS: 0.5000 mg | ORAL_TABLET | Freq: Four times a day (QID) | ORAL | 0 refills | Status: DC | PRN

## 2017-12-30 NOTE — Patient Instructions (Signed)
Bevacizumab injection What is this medicine? BEVACIZUMAB (be va SIZ yoo mab) is a monoclonal antibody. It is used to treat many types of cancer. This medicine may be used for other purposes; ask your health care provider or pharmacist if you have questions. COMMON BRAND NAME(S): Avastin What should I tell my health care provider before I take this medicine? They need to know if you have any of these conditions: -diabetes -heart disease -high blood pressure -history of coughing up blood -prior anthracycline chemotherapy (e.g., doxorubicin, daunorubicin, epirubicin) -recent or ongoing radiation therapy -recent or planning to have surgery -stroke -an unusual or allergic reaction to bevacizumab, hamster proteins, mouse proteins, other medicines, foods, dyes, or preservatives -pregnant or trying to get pregnant -breast-feeding How should I use this medicine? This medicine is for infusion into a vein. It is given by a health care professional in a hospital or clinic setting. Talk to your pediatrician regarding the use of this medicine in children. Special care may be needed. Overdosage: If you think you have taken too much of this medicine contact a poison control center or emergency room at once. NOTE: This medicine is only for you. Do not share this medicine with others. What if I miss a dose? It is important not to miss your dose. Call your doctor or health care professional if you are unable to keep an appointment. What may interact with this medicine? Interactions are not expected. This list may not describe all possible interactions. Give your health care provider a list of all the medicines, herbs, non-prescription drugs, or dietary supplements you use. Also tell them if you smoke, drink alcohol, or use illegal drugs. Some items may interact with your medicine. What should I watch for while using this medicine? Your condition will be monitored carefully while you are receiving this  medicine. You will need important blood work and urine testing done while you are taking this medicine. This medicine may increase your risk to bruise or bleed. Call your doctor or health care professional if you notice any unusual bleeding. This medicine should be started at least 28 days following major surgery and the site of the surgery should be totally healed. Check with your doctor before scheduling dental work or surgery while you are receiving this treatment. Talk to your doctor if you have recently had surgery or if you have a wound that has not healed. Do not become pregnant while taking this medicine or for 6 months after stopping it. Women should inform their doctor if they wish to become pregnant or think they might be pregnant. There is a potential for serious side effects to an unborn child. Talk to your health care professional or pharmacist for more information. Do not breast-feed an infant while taking this medicine and for 6 months after the last dose. This medicine has caused ovarian failure in some women. This medicine may interfere with the ability to have a child. You should talk to your doctor or health care professional if you are concerned about your fertility. What side effects may I notice from receiving this medicine? Side effects that you should report to your doctor or health care professional as soon as possible: -allergic reactions like skin rash, itching or hives, swelling of the face, lips, or tongue -chest pain or chest tightness -chills -coughing up blood -high fever -seizures -severe constipation -signs and symptoms of bleeding such as bloody or black, tarry stools; red or dark-brown urine; spitting up blood or brown material that looks   like coffee grounds; red spots on the skin; unusual bruising or bleeding from the eye, gums, or nose -signs and symptoms of a blood clot such as breathing problems; chest pain; severe, sudden headache; pain, swelling, warmth in  the leg -signs and symptoms of a stroke like changes in vision; confusion; trouble speaking or understanding; severe headaches; sudden numbness or weakness of the face, arm or leg; trouble walking; dizziness; loss of balance or coordination -stomach pain -sweating -swelling of legs or ankles -vomiting -weight gain Side effects that usually do not require medical attention (report to your doctor or health care professional if they continue or are bothersome): -back pain -changes in taste -decreased appetite -dry skin -nausea -tiredness This list may not describe all possible side effects. Call your doctor for medical advice about side effects. You may report side effects to FDA at 1-800-FDA-1088. Where should I keep my medicine? This drug is given in a hospital or clinic and will not be stored at home. NOTE: This sheet is a summary. It may not cover all possible information. If you have questions about this medicine, talk to your doctor, pharmacist, or health care provider.  2018 Elsevier/Gold Standard (2016-04-10 14:33:29) Pemetrexed injection What is this medicine? PEMETREXED (PEM e TREX ed) is a chemotherapy drug used to treat lung cancers like non-small cell lung cancer and mesothelioma. It may also be used to treat other cancers. This medicine may be used for other purposes; ask your health care provider or pharmacist if you have questions. COMMON BRAND NAME(S): Alimta What should I tell my health care provider before I take this medicine? They need to know if you have any of these conditions: -infection (especially a virus infection such as chickenpox, cold sores, or herpes) -kidney disease -low blood counts, like low white cell, platelet, or red cell counts -lung or breathing disease, like asthma -radiation therapy -an unusual or allergic reaction to pemetrexed, other medicines, foods, dyes, or preservative -pregnant or trying to get pregnant -breast-feeding How should I use  this medicine? This drug is given as an infusion into a vein. It is administered in a hospital or clinic by a specially trained health care professional. Talk to your pediatrician regarding the use of this medicine in children. Special care may be needed. Overdosage: If you think you have taken too much of this medicine contact a poison control center or emergency room at once. NOTE: This medicine is only for you. Do not share this medicine with others. What if I miss a dose? It is important not to miss your dose. Call your doctor or health care professional if you are unable to keep an appointment. What may interact with this medicine? This medicine may interact with the following medications: -Ibuprofen This list may not describe all possible interactions. Give your health care provider a list of all the medicines, herbs, non-prescription drugs, or dietary supplements you use. Also tell them if you smoke, drink alcohol, or use illegal drugs. Some items may interact with your medicine. What should I watch for while using this medicine? Visit your doctor for checks on your progress. This drug may make you feel generally unwell. This is not uncommon, as chemotherapy can affect healthy cells as well as cancer cells. Report any side effects. Continue your course of treatment even though you feel ill unless your doctor tells you to stop. In some cases, you may be given additional medicines to help with side effects. Follow all directions for their use. Call your  doctor or health care professional for advice if you get a fever, chills or sore throat, or other symptoms of a cold or flu. Do not treat yourself. This drug decreases your body's ability to fight infections. Try to avoid being around people who are sick. This medicine may increase your risk to bruise or bleed. Call your doctor or health care professional if you notice any unusual bleeding. Be careful brushing and flossing your teeth or using a  toothpick because you may get an infection or bleed more easily. If you have any dental work done, tell your dentist you are receiving this medicine. Avoid taking products that contain aspirin, acetaminophen, ibuprofen, naproxen, or ketoprofen unless instructed by your doctor. These medicines may hide a fever. Call your doctor or health care professional if you get diarrhea or mouth sores. Do not treat yourself. To protect your kidneys, drink water or other fluids as directed while you are taking this medicine. Do not become pregnant while taking this medicine or for 6 months after stopping it. Women should inform their doctor if they wish to become pregnant or think they might be pregnant. Men should not father a child while taking this medicine and for 3 months after stopping it. This may interfere with the ability to father a child. You should talk to your doctor or health care professional if you are concerned about your fertility. There is a potential for serious side effects to an unborn child. Talk to your health care professional or pharmacist for more information. Do not breast-feed an infant while taking this medicine or for 1 week after stopping it. What side effects may I notice from receiving this medicine? Side effects that you should report to your doctor or health care professional as soon as possible: -allergic reactions like skin rash, itching or hives, swelling of the face, lips, or tongue -breathing problems -redness, blistering, peeling or loosening of the skin, including inside the mouth -signs and symptoms of bleeding such as bloody or black, tarry stools; red or dark-brown urine; spitting up blood or brown material that looks like coffee grounds; red spots on the skin; unusual bruising or bleeding from the eye, gums, or nose -signs and symptoms of infection like fever or chills; cough; sore throat; pain or trouble passing urine -signs and symptoms of kidney injury like trouble  passing urine or change in the amount of urine -signs and symptoms of liver injury like dark yellow or brown urine; general ill feeling or flu-like symptoms; light-colored stools; loss of appetite; nausea; right upper belly pain; unusually weak or tired; yellowing of the eyes or skin Side effects that usually do not require medical attention (report to your doctor or health care professional if they continue or are bothersome): -constipation -dizziness -mouth sores -nausea, vomiting -pain, tingling, numbness in the hands or feet -unusually weak or tired This list may not describe all possible side effects. Call your doctor for medical advice about side effects. You may report side effects to FDA at 1-800-FDA-1088. Where should I keep my medicine? This drug is given in a hospital or clinic and will not be stored at home. NOTE: This sheet is a summary. It may not cover all possible information. If you have questions about this medicine, talk to your doctor, pharmacist, or health care provider.  2018 Elsevier/Gold Standard (2016-02-11 18:51:46) Cyanocobalamin, Vitamin B12 injection ??? ???????????? ????? ??? ?????????? ?????????????? ??? ????????????? ????? ???????? ?12. ??????? ?12 ?????? ?????? ???? ? ???????? ?????????? ?????? ?????, ??????? ?????? ? ?????? ? ?????????. ?? ????? ????????? ? ??????????? ????? ? ?????????. ??? ????????? ??????????? ??? ??????? ?????, ? ??????? ???????? ???????? ???????????? ?????????? ???????? ?  12. ??? ????????? ????? ??????????? ? ?????? ?????; ???? ? ??? ????????? ???????, ?????????? ? ???????????? ????????? ??? ??????????. ???????????????? ????????? ???????? (????????): B-12 Compliance Kit, B-12 Injection Kit, Cyomin, LA-12, Nutri-Twelve, Physicians EZ Use B-12, Primabalt ??? ??? ??????? ?????????? ???????????? ????????? ?? ?????? ?????? ????? ?????????? ?????????? ???????? ? ??????? ? ??? ?????? ?????????: -??????????? ?????; -???????  ??????; -?????????????? ??????; -????????? ??? ????????????? ??????? ?? ?????????????? ??? ???????; -????????? ??? ????????????? ??????? ?? ?????? ?????????, ??????? ????????, ????????? ??? ???????????; -???????????? ??? ???????????? ????????????; -????????? ??????; ??? ??? ??????? ????????? ??? ?????????? ??? ????????? ???????? ????????????? ??? ??????? ????????. ?? ?????? ???????? ??????????? ?????????? ? ??????? ??? ???????? ?????. ?????? ???? ????? ??????? ??? ?????????????? ????????? ????????. ?????????? ??? ??????????. ???????? ???????? ? ??????????? ?????????? ????? ????????? ?????. ? ???? ?????? ?????????? ?????? ????????????. ?????????????: ???? ??? ???????, ??? ?? ????????? ?????????? ???? ????? ?????????, ?????????? ?????????? ? ????????????????? ????? ??? ???????? ????????? ??? ???????? ?????????? ??????. ??????????: ??? ????????? ????????????? ?????? ??? ???. ?? ???????? ?? ? ??????? ??????. ??? ?????????? ? ?????? ???????? ?????? ?????????? ???? ????????? ???????? ??? ? ??????? ??? ???????? ?????, ?????????, ????? ????????? ????? ??????. ???? ?? ??????? ??? ????????? ?????????????? ? ?????????? ?????, ??????? ??? ??? ????? ??????. ???? ???????????? ????? ?????? ????????? ????, ??????? ?????? ??? ????. ?? ?????????? ??????? ??? ?????????????? ????. ? ??? ??? ????????? ????? ???????? ?? ??????????????? -????????; -???????????? ??????? ??????????? ????????; ???? ???????? ?? ???????? ???? ????????? ??????????????. ???????????? ???????????? ????????? ?????? ???? ??????????? ???? ????????, ????????????? ????????, ?????????????? ?????????? ? ??????? ???????. ????? ???????? ??? ? ???????, ???????????? ??????????? ???????? ??? ??????????. ????????? ???????? ????? ???????? ?? ?????????????? ? ??????????? ???? ??????????. ?? ??? ????? ???????? ???????? ??? ????????????? ????? ?????????? ????????? ????????? ????? ??? ??????? ???????????? ????????? ??? ?????????? ?? ????? ??????????. ?? ?????  ?????? ????? ????????? ??? ??????? ????????? ??????? ??????? ?????. ????????, ??? ????? ????? ????????? ??????????? ?????. ???????? ?????. ??? ?????????? ????????????? ???????? ????? ????????? ?????????? ???????????? ???????? ? ???????????? ?? ???????. ????? ???????? ??????? ????? ???? ??? ?????? ????? ?????????? ???????? ???????, ? ??????? ??? ??????? ??? ????? ?????? ???????? ????? ??? ??????? ???????????? ?????????: -????????????? ???????, ????? ??? ?????? ????, ???, ??????????, ????????? ????, ??? ??? ?????; -???????? ???? ????; -???? ??? ????????? ? ??????? ??????; -????????? ??? ???????????? ???????; -??????????????; -??????????? ??????????? ???????? ????? ?? ????; ???????? ???????, ??????? ?????? ?? ??????? ???????????? ???????????? ???????? (???????? ????? ??? ??????? ???????????? ?????????, ???? ??? ???????????? ??? ?????????? ??????????): -??????; -???????? ????; ???? ???????? ?? ???????? ???? ????????? ???????? ????????. ?????????? ? ????? ?? ??????? ???????????? ???????? ????????. ?? ?????? ???????? ? ???????? ???????? ? FDA ?? ???????? [phone]. ??? ??????? ??????? ??? ?????????? ??????? ? ??????????? ??? ????? ?????. ??????? ??? ????????? ??????????? 15-30 degrees? (59-86 ???????? ?? ????? ??????????). ???????? ?? ??????????? ?????. ??????? ??????????? ??? ???????????????? ????????? ????? ????????? ????? ????????, ?????????? ?? ???????? ??? ????????. ??????????: ???? ???????? ?? ???????? ???? ????????? ????????. ???? ? ??? ????????? ???????, ?????????? ??????? ?????????, ?????????? ? ?????, ?????????? ??? ??????? ???????????? ?????????.  2018 Elsevier/Gold Standard (2009-03-15 00:00:00) Prochlorperazine tablets What is this medicine? PROCHLORPERAZINE (proe klor PER a zeen) helps to control severe nausea and vomiting. This medicine is also used to treat schizophrenia. It can also help patients who experience anxiety that is not due to psychological illness. This medicine may  be used for other purposes; ask your health care provider or pharmacist if you have questions. COMMON BRAND NAME(S): Compazine What should I tell my health care provider before I take this medicine? They need to know if you have any of  these conditions: -blood disorders or disease -dementia -liver disease or jaundice -Parkinson's disease -uncontrollable movement disorder -an unusual or allergic reaction to prochlorperazine, other medicines, foods, dyes, or preservatives -pregnant or trying to get pregnant -breast-feeding How should I use this medicine? Take this medicine by mouth with a glass of water. Follow the directions on the prescription label. Take your doses at regular intervals. Do not take your medicine more often than directed. Do not stop taking this medicine suddenly. This can cause nausea, vomiting, and dizziness. Ask your doctor or health care professional for advice. Talk to your pediatrician regarding the use of this medicine in children. Special care may be needed. While this drug may be prescribed for children as young as 2 years for selected conditions, precautions do apply. Overdosage: If you think you have taken too much of this medicine contact a poison control center or emergency room at once. NOTE: This medicine is only for you. Do not share this medicine with others. What if I miss a dose? If you miss a dose, take it as soon as you can. If it is almost time for your next dose, take only that dose. Do not take double or extra doses. What may interact with this medicine? Do not take this medicine with any of the following medications: -amoxapine -antidepressants like citalopram, escitalopram, fluoxetine, paroxetine, and sertraline -deferoxamine -dofetilide -maprotiline -tricyclic antidepressants like amitriptyline, clomipramine, imipramine, nortiptyline and others This medicine may also interact with the following medications: -lithium -medicines for  pain -phenytoin -propranolol -warfarin This list may not describe all possible interactions. Give your health care provider a list of all the medicines, herbs, non-prescription drugs, or dietary supplements you use. Also tell them if you smoke, drink alcohol, or use illegal drugs. Some items may interact with your medicine. What should I watch for while using this medicine? Visit your doctor or health care professional for regular checks on your progress. You may get drowsy or dizzy. Do not drive, use machinery, or do anything that needs mental alertness until you know how this medicine affects you. Do not stand or sit up quickly, especially if you are an older patient. This reduces the risk of dizzy or fainting spells. Alcohol may interfere with the effect of this medicine. Avoid alcoholic drinks. This medicine can reduce the response of your body to heat or cold. Dress warm in cold weather and stay hydrated in hot weather. If possible, avoid extreme temperatures like saunas, hot tubs, very hot or cold showers, or activities that can cause dehydration such as vigorous exercise. This medicine can make you more sensitive to the sun. Keep out of the sun. If you cannot avoid being in the sun, wear protective clothing and use sunscreen. Do not use sun lamps or tanning beds/booths. Your mouth may get dry. Chewing sugarless gum or sucking hard candy, and drinking plenty of water may help. Contact your doctor if the problem does not go away or is severe. What side effects may I notice from receiving this medicine? Side effects that you should report to your doctor or health care professional as soon as possible: -blurred vision -breast enlargement in men or women -breast milk in women who are not breast-feeding -chest pain, fast or irregular heartbeat -confusion, restlessness -dark yellow or brown urine -difficulty breathing or swallowing -dizziness or fainting spells -drooling, shaking, movement  difficulty (shuffling walk) or rigidity -fever, chills, sore throat -involuntary or uncontrollable movements of the eyes, mouth, head, arms, and legs -seizures -stomach area  pain -unusually weak or tired -unusual bleeding or bruising -yellowing of skin or eyes Side effects that usually do not require medical attention (report to your doctor or health care professional if they continue or are bothersome): -difficulty passing urine -difficulty sleeping -headache -sexual dysfunction -skin rash, or itching This list may not describe all possible side effects. Call your doctor for medical advice about side effects. You may report side effects to FDA at 1-800-FDA-1088. Where should I keep my medicine? Keep out of the reach of children. Store at room temperature between 15 and 30 degrees C (59 and 86 degrees F). Protect from light. Throw away any unused medicine after the expiration date. NOTE: This sheet is a summary. It may not cover all possible information. If you have questions about this medicine, talk to your doctor, pharmacist, or health care provider.  2018 Elsevier/Gold Standard (2011-09-01 16:59:39)

## 2017-12-30 NOTE — Progress Notes (Signed)
Hematology and Oncology Follow Up Visit  Samantha Quinn [id number] Apr 17, 1928 84 y.o. 12/30/2017   Principle Diagnosis:  Stage IV adenocarcinoma of the right lung-mutation negative C6-7 left nerve root compression secondary to metastatic disease - PD-L1 (0%) /TMB of 9  Past Therapy: Status post radiation/chemotherapy for recurrence-completed October 2017 Status post right upper lobectomy in March 2013 for stage IB disease XRT to the cervical spine - started 04/02/2017   Current Therapy:   Tecentriq 1200 mg IV every 3 weeks s/p cycle 8 -- d/c progression Zometa 4 mg IV q 2 months --on hold until November 2019 Alimta/Avastin -- cycle #1 on 12/30/2017   Interim History:  Samantha Quinn is here today for follow-up.  She has she looks pretty good.  She is doing quite well.  She had a good Labor Day weekend.  Unfortunate, her daughter still is recovering from this fractured right ankle.  She is having surgery today.  Samantha Quinn is ready for chemotherapy.  I think that she will do well with chemotherapy.  She is had no cough or shortness of breath.  She is had no change in bowel or bladder habits.  She is had no weakness.  She had a good appetite.  She is had no fever.  She has had no oral issues.  She has had no problems swallowing.  Overall, her performance status is ECOG 1.  Are holding the Zometa for right now.    Medications:  Allergies as of 12/30/2017      Reactions   Tramadol Other (See Comments)   syncope syncope   Codeine Nausea And Vomiting   Demerol Nausea Only      Medication List        Accurate as of 12/30/17 12:55 PM. Always use your most recent med list.          ALIGN PO Take by mouth daily.   amitriptyline 10 MG tablet Commonly known as:  ELAVIL TAKE 1 TABLET BY MOUTH EVERYDAY AT BEDTIME   bisoprolol-hydrochlorothiazide 2.5-6.25 MG tablet Commonly known as:  ZIAC TAKE 1 TABLET BY MOUTH EVERY DAY   calcium carbonate 600 MG tablet Commonly known as:   OS-CAL Take 600 mg by mouth daily.   cholecalciferol 1000 units tablet Commonly known as:  VITAMIN D Take 2,000 Units by mouth daily.   gabapentin 100 MG capsule Commonly known as:  NEURONTIN Take 100 mg by mouth 3 (three) times daily.   glucose blood test strip 1 each by Other route 2 (two) times daily. Use to check blood sugars twice a day   HYDROcodone-homatropine 5-1.5 MG/5ML syrup Commonly known as:  HYCODAN Take 5 mLs by mouth every 6 (six) hours as needed for cough.   lovastatin 20 MG tablet Commonly known as:  MEVACOR TAKE 1 TABLET BY MOUTH EVERY OTHER DAY   metFORMIN 500 MG tablet Commonly known as:  GLUCOPHAGE Take 1 tablet (500 mg total) by mouth 2 (two) times daily with a meal.   methimazole 5 MG tablet Commonly known as:  TAPAZOLE Take 2.5 mg every  day   ONE TOUCH ULTRA 2 w/Device Kit Use to check blood sugars twice a day Dx E11.9   onetouch ultrasoft lancets 1 each by Other route 2 (two) times daily. Use to help check blood sugars twice a day   ramipril 2.5 MG capsule Commonly known as:  ALTACE TAKE 1 CAPSULE BY MOUTH EVERY DAY   VISION-VITE PRESERVE PO Take 1 tablet by mouth daily.  Allergies:  Allergies  Allergen Reactions  . Tramadol Other (See Comments)    syncope syncope  . Codeine Nausea And Vomiting  . Demerol Nausea Only    Past Medical History, Surgical history, Social history, and Family History were reviewed and updated.  Review of Systems: Review of Systems  Constitutional: Negative.   HENT: Negative.   Eyes: Negative.   Respiratory: Negative.   Cardiovascular: Negative.   Gastrointestinal: Negative.   Genitourinary: Negative.   Musculoskeletal: Positive for back pain and joint pain.  Skin: Negative.   Neurological: Negative.   Endo/Heme/Allergies: Negative.   Psychiatric/Behavioral: Negative.       Physical Exam:  weight is 120 lb (54.4 kg). Her oral temperature is 97.7 F (36.5 C). Her blood pressure is  115/55 (abnormal) and her pulse is 75. Her respiration is 16 and oxygen saturation is 99%.   Wt Readings from Last 3 Encounters:  12/30/17 120 lb (54.4 kg)  12/16/17 120 lb (54.4 kg)  11/16/17 123 lb 6.4 oz (56 kg)    Physical Exam  Constitutional: She is oriented to person, place, and time.  HENT:  Head: Normocephalic and atraumatic.  Mouth/Throat: Oropharynx is clear and moist.  Eyes: Pupils are equal, round, and reactive to light. EOM are normal.  Neck: Normal range of motion.  Cardiovascular: Normal rate, regular rhythm and normal heart sounds.  Pulmonary/Chest: Effort normal and breath sounds normal.  Abdominal: Soft. Bowel sounds are normal.  Musculoskeletal: Normal range of motion. She exhibits no edema, tenderness or deformity.  Lymphadenopathy:    She has no cervical adenopathy.  Neurological: She is alert and oriented to person, place, and time.  Skin: Skin is warm and dry. No rash noted. No erythema.  Psychiatric: She has a normal mood and affect. Her behavior is normal. Judgment and thought content normal.  Vitals reviewed.    Lab Results  Component Value Date   WBC 5.9 12/30/2017   HGB 12.3 12/30/2017   HCT 37.0 12/30/2017   MCV 88.7 12/30/2017   PLT 229 12/30/2017   Lab Results  Component Value Date   FERRITIN 514 (H) 04/07/2017   IRON 61 04/07/2017   TIBC 299 04/07/2017   UIBC 238 04/07/2017   IRONPCTSAT 20 (L) 04/07/2017   Lab Results  Component Value Date   RBC 4.17 12/30/2017   No results found for: KPAFRELGTCHN, LAMBDASER, KAPLAMBRATIO No results found for: IGGSERUM, IGA, IGMSERUM No results found for: Odetta Pink, SPEI   Chemistry      Component Value Date/Time   NA 136 12/30/2017 1123   NA 144 04/07/2017 1215   NA 135 (L) 03/11/2017 1416   K 4.0 12/30/2017 1123   K 4.0 04/07/2017 1215   K 3.9 03/11/2017 1416   CL 105 12/30/2017 1123   CL 103 04/07/2017 1215   CO2 28 12/30/2017  1123   CO2 29 04/07/2017 1215   CO2 23 03/11/2017 1416   BUN 14 12/30/2017 1123   BUN 13 04/07/2017 1215   BUN 16.1 03/11/2017 1416   CREATININE 0.60 12/30/2017 1123   CREATININE 0.8 04/07/2017 1215   CREATININE 0.9 03/11/2017 1416      Component Value Date/Time   CALCIUM 9.4 12/30/2017 1123   CALCIUM 9.6 04/07/2017 1215   CALCIUM 9.6 03/11/2017 1416   ALKPHOS 73 12/30/2017 1123   ALKPHOS 60 04/07/2017 1215   ALKPHOS 79 03/11/2017 1416   AST 21 12/30/2017 1123   AST 11 03/11/2017 1416  ALT 16 12/30/2017 1123   ALT 15 04/07/2017 1215   ALT 11 03/11/2017 1416   BILITOT 0.6 12/30/2017 1123   BILITOT 0.26 03/11/2017 1416      Impression and Plan: Ms. Laramee is a pleasant 84 yo caucasian female with stage IV progressive non small cell adenocarcinoma of the lung.   We will proceed with chemotherapy today.  We will give her 3 cycles and then we will see how things look on a PET scan.  Again I am holding the Zometa until November.  I will plan to see her back in another 3 weeks.   [name], MD 9/5/201912:55 PM

## 2018-01-11 DIAGNOSIS — Z6822 Body mass index (BMI) 22.0-22.9, adult: Secondary | ICD-10-CM | POA: Diagnosis not present

## 2018-01-11 DIAGNOSIS — M4716 Other spondylosis with myelopathy, lumbar region: Secondary | ICD-10-CM | POA: Diagnosis not present

## 2018-01-17 ENCOUNTER — Other Ambulatory Visit: Payer: Self-pay

## 2018-01-17 ENCOUNTER — Ambulatory Visit
Admission: RE | Admit: 2018-01-17 | Discharge: 2018-01-17 | Disposition: A | Discharge status: Home / Self Care | Payer: Medicare Other | Source: Ambulatory Visit | Attending: Radiation Oncology | Admitting: Radiation Oncology

## 2018-01-17 ENCOUNTER — Encounter: Payer: Self-pay | Admitting: Radiation Oncology

## 2018-01-17 VITALS — BP 149/67 | HR 76 | Temp 98.5°F | Resp 18 | Ht 61.0 in | Wt 118.4 lb

## 2018-01-17 DIAGNOSIS — Z888 Allergy status to other drugs, medicaments and biological substances status: Secondary | ICD-10-CM | POA: Diagnosis not present

## 2018-01-17 DIAGNOSIS — C3490 Malignant neoplasm of unspecified part of unspecified bronchus or lung: Secondary | ICD-10-CM

## 2018-01-17 DIAGNOSIS — Z7984 Long term (current) use of oral hypoglycemic drugs: Secondary | ICD-10-CM | POA: Insufficient documentation

## 2018-01-17 DIAGNOSIS — Z79899 Other long term (current) drug therapy: Secondary | ICD-10-CM | POA: Diagnosis not present

## 2018-01-17 DIAGNOSIS — C7951 Secondary malignant neoplasm of bone: Secondary | ICD-10-CM | POA: Diagnosis not present

## 2018-01-17 DIAGNOSIS — Z885 Allergy status to narcotic agent status: Secondary | ICD-10-CM | POA: Insufficient documentation

## 2018-01-17 DIAGNOSIS — Z923 Personal history of irradiation: Secondary | ICD-10-CM | POA: Diagnosis not present

## 2018-01-17 NOTE — Progress Notes (Signed)
Radiation Oncology         [phone] ________________________________  Name: Samantha Quinn MRN: [id number]  Date: 01/17/2018  DOB: [birth year]  Follow-Up Visit Note  CC: Binnie Rail, MD  [name], MD    ICD-10-CM   1. Lung cancer metastatic to bone [facility]) C34.90    C79.51     Diagnosis:   Metastatic adenocarcinoma of the lung with painful osseous metastasis  Interval Since Last Radiation:  5 weeks  11/23/17 - 12/10/17: Lumbar Spine and Left Pelvis were both treated to 35 Gy in 14 fractions, using 15X photons with a 3D technique  Narrative:  The patient returns today for routine follow-up.  She reports feeling well overall. She reports discomfort to her neck, moderate fatigue, shortness of breath with activity, and some difficulty swallowing solid food. She denies pain to the radiation site, cough, issues with appetite, and skin issues to the radiation site.                         ALLERGIES:  is allergic to tramadol; codeine; and demerol.  Meds: Current Outpatient Medications  Medication Sig Dispense Refill  . amitriptyline (ELAVIL) 10 MG tablet TAKE 1 TABLET BY MOUTH EVERYDAY AT BEDTIME 90 tablet 1  . bisoprolol-hydrochlorothiazide (ZIAC) 2.5-6.25 MG tablet TAKE 1 TABLET BY MOUTH EVERY DAY 90 tablet 0  . Blood Glucose Monitoring Suppl (ONE TOUCH ULTRA 2) w/Device KIT Use to check blood sugars twice a day Dx E11.9 1 each 0  . calcium carbonate (OS-CAL) 600 MG tablet Take 600 mg by mouth daily.    . cholecalciferol (VITAMIN D) 1000 units tablet Take 2,000 Units by mouth daily.     . folic acid (FOLVITE) 1 MG tablet Take 1 tablet (1 mg total) by mouth daily. Start 5-7 days before Alimta chemotherapy. Continue until 21 days after Alimta completed. 100 tablet 3  . gabapentin (NEURONTIN) 100 MG capsule Take 100 mg by mouth 3 (three) times daily.     [name] glucose blood (ONE TOUCH ULTRA TEST) test strip 1 each by Other route 2 (two) times daily. Use to check blood sugars twice a  day 100 each 3  . HYDROcodone-homatropine (HYCODAN) 5-1.5 MG/5ML syrup Take 5 mLs by mouth every 6 (six) hours as needed for cough. 120 mL 0  . Lancets (ONETOUCH ULTRASOFT) lancets 1 each by Other route 2 (two) times daily. Use to help check blood sugars twice a day 100 each 3  . lovastatin (MEVACOR) 20 MG tablet TAKE 1 TABLET BY MOUTH EVERY OTHER DAY 90 tablet 1  . metFORMIN (GLUCOPHAGE) 500 MG tablet Take 1 tablet (500 mg total) by mouth 2 (two) times daily with a meal. 180 tablet 1  . methimazole (TAPAZOLE) 5 MG tablet Take 2.5 mg every  day 45 tablet 2  . Multiple Vitamins-Minerals (VISION-VITE PRESERVE PO) Take 1 tablet by mouth daily.     . Probiotic Product (ALIGN PO) Take by mouth daily.    . ramipril (ALTACE) 2.5 MG capsule TAKE 1 CAPSULE BY MOUTH EVERY DAY 90 capsule 1  . dexamethasone (DECADRON) 4 MG tablet Take 1 tab two times a day the day before Alimta chemo. Take 2 tabs the day after chemo, then take 2 tabs two times a day for 2 days. (Patient not taking: Reported on 01/17/2018) 30 tablet 1  . LORazepam (ATIVAN) 0.5 MG tablet Take 1 tablet (0.5 mg total) by mouth every 6 (six) hours as  needed (Nausea or vomiting). (Patient not taking: Reported on 01/17/2018) 30 tablet 0  . ondansetron (ZOFRAN) 8 MG tablet Take 1 tablet (8 mg total) by mouth 2 (two) times daily as needed (Nausea or vomiting). (Patient not taking: Reported on 01/17/2018) 30 tablet 1  . prochlorperazine (COMPAZINE) 10 MG tablet Take 1 tablet (10 mg total) by mouth every 6 (six) hours as needed (Nausea or vomiting). (Patient not taking: Reported on 01/17/2018) 30 tablet 1   No current facility-administered medications for this encounter.     Physical Findings: The patient is in no acute distress. Patient is alert and oriented.  height is '5\' 1"'  (1.549 m) and weight is 118 lb 6 oz (53.7 kg). Her oral temperature is 98.5 F (36.9 C). Her blood pressure is 149/67 (abnormal) and her pulse is 76. Her respiration is 18 and oxygen  saturation is 97%.  Lungs are clear to auscultation bilaterally. Heart has regular rate and rhythm. No palpable cervical, supraclavicular, or axillary adenopathy. Abdomen soft, non-tender, normal bowel sounds.  Lab Findings: Lab Results  Component Value Date   WBC 5.9 12/30/2017   HGB 12.3 12/30/2017   HCT 37.0 12/30/2017   MCV 88.7 12/30/2017   PLT 229 12/30/2017    Radiographic Findings: No results found.  Impression:  Metastatic adenocarcinoma of the lung with painful osseous metastasis The patient is recovering from the effects of radiation.  No  Pain at this time in the lumbar spine or left pelvis region. Tolerating chemotherapy well at this point.  Plan:  PRN follow up with radiation oncology.  -----------------------------------  Blair Promise, PhD, MD  This document serves as a record of services personally performed by Gery Pray, MD. It was created on his behalf by Wilburn Mylar, a trained medical scribe. The creation of this record is based on the scribe's personal observations and the provider's statements to them. This document has been checked and approved by the attending provider.

## 2018-01-17 NOTE — Progress Notes (Signed)
Samantha Quinn is here for a follow-up appointment with Dr. Sondra Come. Patient denies any pain .Patient states that she has moderate fatigue.Patient states that she gets shortness of breath with activity.States that she has some difficulty with swallowing solid food.Denies any issues with coughing. States that her appitiie is fair.Denies any issues with her skin in her radiation area. Vitals:   01/17/18 1544  BP: (!) 149/67  Pulse: 76  Resp: 18  Temp: 98.5 F (36.9 C)  TempSrc: Oral  SpO2: 97%  Weight: 118 lb 6 oz (53.7 kg)  Height: 5\' 1"  (1.549 m)   Wt Readings from Last 3 Encounters:  01/17/18 118 lb 6 oz (53.7 kg)  12/30/17 120 lb (54.4 kg)  12/16/17 120 lb (54.4 kg)

## 2018-01-19 DIAGNOSIS — D485 Neoplasm of uncertain behavior of skin: Secondary | ICD-10-CM | POA: Diagnosis not present

## 2018-01-19 DIAGNOSIS — Z85828 Personal history of other malignant neoplasm of skin: Secondary | ICD-10-CM | POA: Diagnosis not present

## 2018-01-19 DIAGNOSIS — L570 Actinic keratosis: Secondary | ICD-10-CM | POA: Diagnosis not present

## 2018-01-19 DIAGNOSIS — Z85820 Personal history of malignant melanoma of skin: Secondary | ICD-10-CM | POA: Diagnosis not present

## 2018-01-20 ENCOUNTER — Inpatient Hospital Stay: Payer: Medicare Other

## 2018-01-20 ENCOUNTER — Encounter: Payer: Self-pay | Admitting: Hematology & Oncology

## 2018-01-20 ENCOUNTER — Other Ambulatory Visit: Payer: Self-pay

## 2018-01-20 ENCOUNTER — Inpatient Hospital Stay (HOSPITAL_BASED_OUTPATIENT_CLINIC_OR_DEPARTMENT_OTHER): Payer: Medicare Other | Admitting: Hematology & Oncology

## 2018-01-20 VITALS — BP 141/60 | HR 74

## 2018-01-20 DIAGNOSIS — Z923 Personal history of irradiation: Secondary | ICD-10-CM | POA: Diagnosis not present

## 2018-01-20 DIAGNOSIS — C3431 Malignant neoplasm of lower lobe, right bronchus or lung: Secondary | ICD-10-CM

## 2018-01-20 DIAGNOSIS — C7951 Secondary malignant neoplasm of bone: Secondary | ICD-10-CM

## 2018-01-20 DIAGNOSIS — Z7984 Long term (current) use of oral hypoglycemic drugs: Secondary | ICD-10-CM | POA: Diagnosis not present

## 2018-01-20 DIAGNOSIS — R05 Cough: Secondary | ICD-10-CM

## 2018-01-20 DIAGNOSIS — Z5112 Encounter for antineoplastic immunotherapy: Secondary | ICD-10-CM | POA: Diagnosis not present

## 2018-01-20 DIAGNOSIS — M2550 Pain in unspecified joint: Secondary | ICD-10-CM | POA: Diagnosis not present

## 2018-01-20 DIAGNOSIS — M549 Dorsalgia, unspecified: Secondary | ICD-10-CM | POA: Diagnosis not present

## 2018-01-20 DIAGNOSIS — Z5111 Encounter for antineoplastic chemotherapy: Secondary | ICD-10-CM | POA: Diagnosis not present

## 2018-01-20 DIAGNOSIS — Z23 Encounter for immunization: Secondary | ICD-10-CM | POA: Diagnosis not present

## 2018-01-20 DIAGNOSIS — E119 Type 2 diabetes mellitus without complications: Secondary | ICD-10-CM

## 2018-01-20 DIAGNOSIS — Z79899 Other long term (current) drug therapy: Secondary | ICD-10-CM

## 2018-01-20 LAB — CMP (CANCER CENTER ONLY)
ALT: 23 U/L (ref 10–47)
ANION GAP: 5 (ref 5–15)
AST: 22 U/L (ref 11–38)
Albumin: 3.2 g/dL — ABNORMAL LOW (ref 3.5–5.0)
Alkaline Phosphatase: 90 U/L — ABNORMAL HIGH (ref 26–84)
BUN: 14 mg/dL (ref 7–22)
CO2: 28 mmol/L (ref 18–33)
Calcium: 9.5 mg/dL (ref 8.0–10.3)
Chloride: 106 mmol/L (ref 98–108)
Creatinine: 0.7 mg/dL (ref 0.60–1.20)
GLUCOSE: 131 mg/dL — AB (ref 73–118)
POTASSIUM: 4.3 mmol/L (ref 3.3–4.7)
Sodium: 139 mmol/L (ref 128–145)
TOTAL PROTEIN: 7 g/dL (ref 6.4–8.1)
Total Bilirubin: 0.6 mg/dL (ref 0.2–1.6)

## 2018-01-20 LAB — CBC WITH DIFFERENTIAL (CANCER CENTER ONLY)
Basophils Absolute: 0 10*3/uL (ref 0.0–0.1)
Basophils Relative: 1 %
EOS PCT: 1 %
Eosinophils Absolute: 0.1 10*3/uL (ref 0.0–0.5)
HCT: 36.7 % (ref 34.8–46.6)
Hemoglobin: 11.9 g/dL (ref 11.6–15.9)
LYMPHS ABS: 0.4 10*3/uL — AB (ref 0.9–3.3)
LYMPHS PCT: 6 %
MCH: 29.4 pg (ref 26.0–34.0)
MCHC: 32.4 g/dL (ref 32.0–36.0)
MCV: 90.6 fL (ref 81.0–101.0)
MONOS PCT: 7 %
Monocytes Absolute: 0.5 10*3/uL (ref 0.1–0.9)
Neutro Abs: 5.9 10*3/uL (ref 1.5–6.5)
Neutrophils Relative %: 85 %
PLATELETS: 342 10*3/uL (ref 145–400)
RBC: 4.05 MIL/uL (ref 3.70–5.32)
RDW: 14.8 % (ref 11.1–15.7)
WBC: 6.9 10*3/uL (ref 3.9–10.0)

## 2018-01-20 MED ORDER — DEXAMETHASONE SODIUM PHOSPHATE 10 MG/ML IJ SOLN
10.0000 mg | Freq: Once | INTRAMUSCULAR | Status: AC
  Administered 2018-01-20: 10 mg via INTRAVENOUS

## 2018-01-20 MED ORDER — ONDANSETRON HCL 4 MG/2ML IJ SOLN
INTRAMUSCULAR | Status: AC
  Filled 2018-01-20: qty 4

## 2018-01-20 MED ORDER — SODIUM CHLORIDE 0.9 % IV SOLN
Freq: Once | INTRAVENOUS | Status: AC
  Administered 2018-01-20: 14:00:00 via INTRAVENOUS
  Filled 2018-01-20: qty 250

## 2018-01-20 MED ORDER — CYANOCOBALAMIN 1000 MCG/ML IJ SOLN
1000.0000 ug | Freq: Once | INTRAMUSCULAR | Status: AC
  Administered 2018-01-20: 1000 ug via INTRAMUSCULAR

## 2018-01-20 MED ORDER — HYDROCODONE-HOMATROPINE 5-1.5 MG/5ML PO SYRP: 5.0000 mL | ORAL_SOLUTION | Freq: Four times a day (QID) | ORAL | 0 refills | Status: DC | PRN

## 2018-01-20 MED ORDER — DEXAMETHASONE 4 MG PO TABS: ORAL_TABLET | ORAL | 4 refills | Status: DC

## 2018-01-20 MED ORDER — INFLUENZA VAC SPLIT QUAD 0.5 ML IM SUSY
0.5000 mL | PREFILLED_SYRINGE | Freq: Once | INTRAMUSCULAR | Status: AC
  Administered 2018-01-20: 0.5 mL via INTRAMUSCULAR

## 2018-01-20 MED ORDER — SODIUM CHLORIDE 0.9 % IV SOLN
400.0000 mg/m2 | Freq: Once | INTRAVENOUS | Status: AC
  Administered 2018-01-20: 600 mg via INTRAVENOUS
  Filled 2018-01-20: qty 20

## 2018-01-20 MED ORDER — INFLUENZA VAC SPLIT QUAD 0.5 ML IM SUSY
PREFILLED_SYRINGE | INTRAMUSCULAR | Status: AC
  Filled 2018-01-20: qty 0.5

## 2018-01-20 MED ORDER — CYANOCOBALAMIN 1000 MCG/ML IJ SOLN
INTRAMUSCULAR | Status: AC
  Filled 2018-01-20: qty 1

## 2018-01-20 MED ORDER — SODIUM CHLORIDE 0.9 % IV SOLN
14.8000 mg/kg | Freq: Once | INTRAVENOUS | Status: AC
  Administered 2018-01-20: 800 mg via INTRAVENOUS
  Filled 2018-01-20: qty 32

## 2018-01-20 MED ORDER — ONDANSETRON HCL 8 MG PO TABS: ORAL_TABLET | ORAL | 4 refills | Status: DC

## 2018-01-20 MED ORDER — DEXAMETHASONE SODIUM PHOSPHATE 10 MG/ML IJ SOLN
INTRAMUSCULAR | Status: AC
  Filled 2018-01-20: qty 1

## 2018-01-20 MED ORDER — ONDANSETRON HCL 4 MG/2ML IJ SOLN
8.0000 mg | Freq: Once | INTRAMUSCULAR | Status: AC
  Administered 2018-01-20: 8 mg via INTRAVENOUS

## 2018-01-20 MED FILL — DEXAMETHASONE 4 MG TABLET: 4 | 20 days supply | Qty: 40 | Fill #0

## 2018-01-20 MED FILL — ONDANSETRON HCL 8 MG TAB: 8 | 15 days supply | Qty: 30 | Fill #0

## 2018-01-20 MED FILL — HYDROCODONE-HOMATROPINE SOL: 5-1.5 | 12 days supply | Qty: 240 | Fill #0

## 2018-01-20 NOTE — Patient Instructions (Signed)
[facility] Discharge Instructions for Patients Receiving Chemotherapy  Today you received the following chemotherapy agents:  Alimta and Avastin  To help prevent nausea and vomiting after your treatment, we encourage you to take your nausea medication as ordered per MD.    If you develop nausea and vomiting that is not controlled by your nausea medication, call the clinic.   BELOW ARE SYMPTOMS THAT SHOULD BE REPORTED IMMEDIATELY:  *FEVER GREATER THAN 100.5 F  *CHILLS WITH OR WITHOUT FEVER  NAUSEA AND VOMITING THAT IS NOT CONTROLLED WITH YOUR NAUSEA MEDICATION  *UNUSUAL SHORTNESS OF BREATH  *UNUSUAL BRUISING OR BLEEDING  TENDERNESS IN MOUTH AND THROAT WITH OR WITHOUT PRESENCE OF ULCERS  *URINARY PROBLEMS  *BOWEL PROBLEMS  UNUSUAL RASH Items with * indicate a potential emergency and should be followed up as soon as possible.  Feel free to call the clinic should you have any questions or concerns. The clinic phone number is [phone].  Please show the [facility] at check-in to the Emergency Department and triage nurse.

## 2018-01-20 NOTE — Progress Notes (Signed)
Hematology and Oncology Follow Up Visit  Samantha Quinn [id number] 08/04/1927 84 y.o. 01/20/2018   Principle Diagnosis:  Stage IV adenocarcinoma of the right lung-mutation negative C6-7 left nerve root compression secondary to metastatic disease - PD-L1 (0%) /TMB of 9  Past Therapy: Status post radiation/chemotherapy for recurrence-completed October 2017 Status post right upper lobectomy in March 2013 for stage IB disease XRT to the cervical spine - started 04/02/2017   Current Therapy:   Tecentriq 1200 mg IV every 3 weeks s/p cycle 8 -- d/c progression Zometa 4 mg IV q 2 months --on hold until November 2019 Alimta/Avastin -- cycle #1 on 12/30/2017   Interim History:  Samantha Quinn is here today for follow-up.  She comes by herself.  However, her daughter, Beth, was on the cell phone listening.  She cannot be with us because of recent foot surgery.  Samantha Quinn did have some issues with the last cycle of chemotherapy.  She was not eating.  She had nausea.  She is only lost a pound.  I think we need to readjust her anti-medic.  I will have her take Zofran and Decadron.  I spoke to her pharmacist about this.  We will give her IV Zofran at a dose of 8 mg and Decadron IV at a dose of 10 mg.  I will also put her on a post chemotherapy protocol of Decadron and Zofran twice a day for 4 days.  She actually looks quite well.  She is not hurting.  She is having a little bit of a cough. AShe does have some Hycodan cough medicine which we will renew.  She is having no change in bowel or bladder habits.  There is no diarrhea.  Overall, I said that her performance status is ECOG 1-2.    Medications:  Allergies as of 01/20/2018      Reactions   Tramadol Other (See Comments)   syncope syncope   Codeine Nausea And Vomiting   Demerol Nausea Only      Medication List        Accurate as of 01/20/18 12:42 PM. Always use your most recent med list.          ALIGN PO Take by mouth daily.     amitriptyline 10 MG tablet Commonly known as:  ELAVIL TAKE 1 TABLET BY MOUTH EVERYDAY AT BEDTIME   bisoprolol-hydrochlorothiazide 2.5-6.25 MG tablet Commonly known as:  ZIAC TAKE 1 TABLET BY MOUTH EVERY DAY   calcium carbonate 600 MG tablet Commonly known as:  OS-CAL Take 600 mg by mouth daily.   cholecalciferol 1000 units tablet Commonly known as:  VITAMIN D Take 2,000 Units by mouth daily.   dexamethasone 4 MG tablet Commonly known as:  DECADRON Take 1 tab two times a day the day before Alimta chemo. Take 2 tabs the day after chemo, then take 2 tabs two times a day for 2 days.   folic acid 1 MG tablet Commonly known as:  FOLVITE Take 1 tablet (1 mg total) by mouth daily. Start 5-7 days before Alimta chemotherapy. Continue until 21 days after Alimta completed.   gabapentin 100 MG capsule Commonly known as:  NEURONTIN Take 100 mg by mouth 3 (three) times daily.   glucose blood test strip 1 each by Other route 2 (two) times daily. Use to check blood sugars twice a day   HYDROcodone-homatropine 5-1.5 MG/5ML syrup Commonly known as:  HYCODAN Take 5 mLs by mouth every 6 (six) hours as needed for   cough.   LORazepam 0.5 MG tablet Commonly known as:  ATIVAN Take 1 tablet (0.5 mg total) by mouth every 6 (six) hours as needed (Nausea or vomiting).   lovastatin 20 MG tablet Commonly known as:  MEVACOR TAKE 1 TABLET BY MOUTH EVERY OTHER DAY   metFORMIN 500 MG tablet Commonly known as:  GLUCOPHAGE Take 1 tablet (500 mg total) by mouth 2 (two) times daily with a meal.   methimazole 5 MG tablet Commonly known as:  TAPAZOLE Take 2.5 mg every  day   ondansetron 8 MG tablet Commonly known as:  ZOFRAN Take 1 tablet (8 mg total) by mouth 2 (two) times daily as needed (Nausea or vomiting).   ONE TOUCH ULTRA 2 w/Device Kit Use to check blood sugars twice a day Dx E11.9   onetouch ultrasoft lancets 1 each by Other route 2 (two) times daily. Use to help check blood sugars twice  a day   prochlorperazine 10 MG tablet Commonly known as:  COMPAZINE Take 1 tablet (10 mg total) by mouth every 6 (six) hours as needed (Nausea or vomiting).   ramipril 2.5 MG capsule Commonly known as:  ALTACE TAKE 1 CAPSULE BY MOUTH EVERY DAY   VISION-VITE PRESERVE PO Take 1 tablet by mouth daily.       Allergies:  Allergies  Allergen Reactions  . Tramadol Other (See Comments)    syncope syncope  . Codeine Nausea And Vomiting  . Demerol Nausea Only    Past Medical History, Surgical history, Social history, and Family History were reviewed and updated.  Review of Systems: Review of Systems  Constitutional: Negative.   HENT: Negative.   Eyes: Negative.   Respiratory: Negative.   Cardiovascular: Negative.   Gastrointestinal: Negative.   Genitourinary: Negative.   Musculoskeletal: Positive for back pain and joint pain.  Skin: Negative.   Neurological: Negative.   Endo/Heme/Allergies: Negative.   Psychiatric/Behavioral: Negative.       Physical Exam:  weight is 119 lb (54 kg). Her oral temperature is 97.4 F (36.3 C) (abnormal). Her blood pressure is 136/65 and her pulse is 75. Her respiration is 16 and oxygen saturation is 98%.   Wt Readings from Last 3 Encounters:  01/20/18 119 lb (54 kg)  01/17/18 118 lb 6 oz (53.7 kg)  12/30/17 120 lb (54.4 kg)    Physical Exam  Constitutional: She is oriented to person, place, and time.  HENT:  Head: Normocephalic and atraumatic.  Mouth/Throat: Oropharynx is clear and moist.  Eyes: Pupils are equal, round, and reactive to light. EOM are normal.  Neck: Normal range of motion.  Cardiovascular: Normal rate, regular rhythm and normal heart sounds.  Pulmonary/Chest: Effort normal and breath sounds normal.  Abdominal: Soft. Bowel sounds are normal.  Musculoskeletal: Normal range of motion. She exhibits no edema, tenderness or deformity.  Lymphadenopathy:    She has no cervical adenopathy.  Neurological: She is alert and  oriented to person, place, and time.  Skin: Skin is warm and dry. No rash noted. No erythema.  Psychiatric: She has a normal mood and affect. Her behavior is normal. Judgment and thought content normal.  Vitals reviewed.    Lab Results  Component Value Date   WBC 6.9 01/20/2018   HGB 11.9 01/20/2018   HCT 36.7 01/20/2018   MCV 90.6 01/20/2018   PLT 342 01/20/2018   Lab Results  Component Value Date   FERRITIN 514 (H) 04/07/2017   IRON 61 04/07/2017   TIBC 299 04/07/2017  UIBC 238 04/07/2017   IRONPCTSAT 20 (L) 04/07/2017   Lab Results  Component Value Date   RBC 4.05 01/20/2018   No results found for: KPAFRELGTCHN, LAMBDASER, KAPLAMBRATIO No results found for: IGGSERUM, IGA, IGMSERUM No results found for: TOTALPROTELP, ALBUMINELP, A1GS, A2GS, BETS, BETA2SER, GAMS, MSPIKE, SPEI   Chemistry      Component Value Date/Time   NA 139 01/20/2018 1202   NA 144 04/07/2017 1215   NA 135 (L) 03/11/2017 1416   K 4.3 01/20/2018 1202   K 4.0 04/07/2017 1215   K 3.9 03/11/2017 1416   CL 106 01/20/2018 1202   CL 103 04/07/2017 1215   CO2 28 01/20/2018 1202   CO2 29 04/07/2017 1215   CO2 23 03/11/2017 1416   BUN 14 01/20/2018 1202   BUN 13 04/07/2017 1215   BUN 16.1 03/11/2017 1416   CREATININE 0.70 01/20/2018 1202   CREATININE 0.8 04/07/2017 1215   CREATININE 0.9 03/11/2017 1416      Component Value Date/Time   CALCIUM 9.5 01/20/2018 1202   CALCIUM 9.6 04/07/2017 1215   CALCIUM 9.6 03/11/2017 1416   ALKPHOS 90 (H) 01/20/2018 1202   ALKPHOS 60 04/07/2017 1215   ALKPHOS 79 03/11/2017 1416   AST 22 01/20/2018 1202   AST 11 03/11/2017 1416   ALT 23 01/20/2018 1202   ALT 15 04/07/2017 1215   ALT 11 03/11/2017 1416   BILITOT 0.6 01/20/2018 1202   BILITOT 0.26 03/11/2017 1416      Impression and Plan: Ms. Storey is a pleasant 84 yo caucasian female with stage IV progressive non small cell adenocarcinoma of the lung.   We will proceed with chemotherapy  today.  Hopefully, the change in antiemetic protocol will help us.  This is all about her quality of life.  I want to make sure that we focus on her quality of life with her treatments.  I do think that she will do better with the change in the anti-emetic regimen.  We will plan to get her back in 4 weeks.  I think maybe an extra week off would help her.  After the third cycle of treatment, we will then repeat a PET scan.   Peter R Ennever, MD 9/26/201912:42 PM 

## 2018-01-21 ENCOUNTER — Ambulatory Visit: Payer: Medicare Other

## 2018-01-31 ENCOUNTER — Inpatient Hospital Stay: Payer: Medicare Other | Attending: Hematology & Oncology

## 2018-01-31 ENCOUNTER — Telehealth: Payer: Self-pay | Admitting: *Deleted

## 2018-01-31 ENCOUNTER — Ambulatory Visit (HOSPITAL_BASED_OUTPATIENT_CLINIC_OR_DEPARTMENT_OTHER)
Admission: RE | Admit: 2018-01-31 | Discharge: 2018-01-31 | Disposition: A | Discharge status: Home / Self Care | Payer: Medicare Other | Source: Ambulatory Visit | Attending: Family | Admitting: Family

## 2018-01-31 ENCOUNTER — Other Ambulatory Visit: Payer: Self-pay | Admitting: Family

## 2018-01-31 ENCOUNTER — Encounter (HOSPITAL_BASED_OUTPATIENT_CLINIC_OR_DEPARTMENT_OTHER): Payer: Self-pay | Admitting: Radiology

## 2018-01-31 ENCOUNTER — Inpatient Hospital Stay (HOSPITAL_BASED_OUTPATIENT_CLINIC_OR_DEPARTMENT_OTHER): Payer: Medicare Other | Admitting: Family

## 2018-01-31 VITALS — BP 142/69 | HR 81 | Temp 98.1°F | Resp 16 | Wt 118.2 lb

## 2018-01-31 DIAGNOSIS — C3490 Malignant neoplasm of unspecified part of unspecified bronchus or lung: Secondary | ICD-10-CM

## 2018-01-31 DIAGNOSIS — C3431 Malignant neoplasm of lower lobe, right bronchus or lung: Secondary | ICD-10-CM

## 2018-01-31 DIAGNOSIS — Z923 Personal history of irradiation: Secondary | ICD-10-CM | POA: Insufficient documentation

## 2018-01-31 DIAGNOSIS — R05 Cough: Secondary | ICD-10-CM | POA: Diagnosis not present

## 2018-01-31 DIAGNOSIS — C7951 Secondary malignant neoplasm of bone: Secondary | ICD-10-CM | POA: Insufficient documentation

## 2018-01-31 DIAGNOSIS — Z79899 Other long term (current) drug therapy: Secondary | ICD-10-CM | POA: Diagnosis not present

## 2018-01-31 DIAGNOSIS — J90 Pleural effusion, not elsewhere classified: Secondary | ICD-10-CM | POA: Diagnosis not present

## 2018-01-31 DIAGNOSIS — R0602 Shortness of breath: Secondary | ICD-10-CM | POA: Diagnosis not present

## 2018-01-31 DIAGNOSIS — E119 Type 2 diabetes mellitus without complications: Secondary | ICD-10-CM | POA: Insufficient documentation

## 2018-01-31 DIAGNOSIS — Z5112 Encounter for antineoplastic immunotherapy: Secondary | ICD-10-CM | POA: Diagnosis not present

## 2018-01-31 DIAGNOSIS — Z5111 Encounter for antineoplastic chemotherapy: Secondary | ICD-10-CM | POA: Insufficient documentation

## 2018-01-31 DIAGNOSIS — R918 Other nonspecific abnormal finding of lung field: Secondary | ICD-10-CM | POA: Insufficient documentation

## 2018-01-31 DIAGNOSIS — Z7984 Long term (current) use of oral hypoglycemic drugs: Secondary | ICD-10-CM

## 2018-01-31 DIAGNOSIS — I700 Atherosclerosis of aorta: Secondary | ICD-10-CM | POA: Diagnosis not present

## 2018-01-31 LAB — CBC WITH DIFFERENTIAL (CANCER CENTER ONLY)
Basophils Absolute: 0 10*3/uL (ref 0.0–0.1)
Basophils Relative: 0 %
Eosinophils Absolute: 0.1 10*3/uL (ref 0.0–0.5)
Eosinophils Relative: 1 %
HEMATOCRIT: 34.7 % — AB (ref 34.8–46.6)
HEMOGLOBIN: 11.3 g/dL — AB (ref 11.6–15.9)
LYMPHS PCT: 9 %
Lymphs Abs: 0.4 10*3/uL — ABNORMAL LOW (ref 0.9–3.3)
MCH: 29.4 pg (ref 26.0–34.0)
MCHC: 32.6 g/dL (ref 32.0–36.0)
MCV: 90.1 fL (ref 81.0–101.0)
MONO ABS: 0.9 10*3/uL (ref 0.1–0.9)
Monocytes Relative: 22 %
NEUTROS ABS: 2.9 10*3/uL (ref 1.5–6.5)
NEUTROS PCT: 68 %
Platelet Count: 160 10*3/uL (ref 145–400)
RBC: 3.85 MIL/uL (ref 3.70–5.32)
RDW: 15 % (ref 11.1–15.7)
WBC Count: 4.3 10*3/uL (ref 3.9–10.0)

## 2018-01-31 LAB — CMP (CANCER CENTER ONLY)
ALBUMIN: 3.4 g/dL — AB (ref 3.5–5.0)
ALK PHOS: 84 U/L (ref 26–84)
ALT: 18 U/L (ref 10–47)
AST: 24 U/L (ref 11–38)
Anion gap: 4 — ABNORMAL LOW (ref 5–15)
BILIRUBIN TOTAL: 0.7 mg/dL (ref 0.2–1.6)
BUN: 13 mg/dL (ref 7–22)
CALCIUM: 9.6 mg/dL (ref 8.0–10.3)
CO2: 30 mmol/L (ref 18–33)
CREATININE: 0.7 mg/dL (ref 0.60–1.20)
Chloride: 104 mmol/L (ref 98–108)
Glucose, Bld: 118 mg/dL (ref 73–118)
Potassium: 4 mmol/L (ref 3.3–4.7)
Sodium: 138 mmol/L (ref 128–145)
TOTAL PROTEIN: 7 g/dL (ref 6.4–8.1)

## 2018-01-31 MED ORDER — IOPAMIDOL (ISOVUE-370) INJECTION 76%
100.0000 mL | Freq: Once | INTRAVENOUS | Status: AC | PRN
  Administered 2018-01-31: 100 mL via INTRAVENOUS

## 2018-01-31 NOTE — Progress Notes (Signed)
Hematology and Oncology Follow Up Visit  Samantha Quinn [id number] 03/20/1928 84 y.o. 01/31/2018   Principle Diagnosis:  Stage IV adenocarcinoma of the right lung-mutation negative C6-7 left nerve root compression secondary to metastatic disease - PD-L1 (0%) /TMB of 9  Past Therapy: Status post radiation/chemotherapy for recurrence-completed October 2017 Status post right upper lobectomy in March 2013 for stage IB disease XRT to the cervical spine - started 04/02/2017  Tecentriq 1200 mg IV every 3 weeks s/p cycle 8 -- d/c progression  Current Therapy:   Alimta/Avastin s/p cycle 2  Zometa 4 mg IV q53month - on hold until November 2019   Interim History:  Samantha Quinn here today with c/o pain in the right side that worsens on inhalation. She states that it radiates into her abdomen.  CT showed a moderate right pleural effusion.  Her baseline SOB and dry cough are unchanged.  She has had no fever, chills, n/v, rash, dizziness, chest pain, palpitations, abdominal pain or changes in bowel or bladder habits.  No swelling, tenderness, numbness or tingling in her extremities.  No lymphadenopathy noted on exam.   She has had a good appetite and is staying well hydrated. Her weight is stable.   ECOG Performance Status: 1 - Symptomatic but completely ambulatory  Medications:  Allergies as of 01/31/2018      Reactions   Tramadol Other (See Comments)   syncope syncope   Codeine Nausea And Vomiting   Demerol Nausea Only      Medication List        Accurate as of 01/31/18  5:18 PM. Always use your most recent med list.          ALIGN PO Take by mouth daily.   amitriptyline 10 MG tablet Commonly known as:  ELAVIL TAKE 1 TABLET BY MOUTH EVERYDAY AT BEDTIME   bisoprolol-hydrochlorothiazide 2.5-6.25 MG tablet Commonly known as:  ZIAC TAKE 1 TABLET BY MOUTH EVERY DAY   calcium carbonate 600 MG tablet Commonly known as:  OS-CAL Take 600 mg by mouth daily.   cholecalciferol  1000 units tablet Commonly known as:  VITAMIN D Take 2,000 Units by mouth daily.   dexamethasone 4 MG tablet Commonly known as:  DECADRON Take 1 tab two times a day for 4 days starting day AFTER each chemotherapy.   folic acid 1 MG tablet Commonly known as:  FOLVITE Take 1 tablet (1 mg total) by mouth daily. Start 5-7 days before Alimta chemotherapy. Continue until 21 days after Alimta completed.   gabapentin 100 MG capsule Commonly known as:  NEURONTIN Take 100 mg by mouth 3 (three) times daily.   glucose blood test strip 1 each by Other route 2 (two) times daily. Use to check blood sugars twice a day   HYDROcodone-homatropine 5-1.5 MG/5ML syrup Commonly known as:  HYCODAN Take 5 mLs by mouth every 6 (six) hours as needed for cough.   LORazepam 0.5 MG tablet Commonly known as:  ATIVAN Take 1 tablet (0.5 mg total) by mouth every 6 (six) hours as needed (Nausea or vomiting).   lovastatin 20 MG tablet Commonly known as:  MEVACOR TAKE 1 TABLET BY MOUTH EVERY OTHER DAY   metFORMIN 500 MG tablet Commonly known as:  GLUCOPHAGE Take 1 tablet (500 mg total) by mouth 2 (two) times daily with a meal.   methimazole 5 MG tablet Commonly known as:  TAPAZOLE Take 2.5 mg every  day   ondansetron 8 MG tablet Commonly known as:  ZOFRAN Take  1 tablet twice a day for 4 days starting day AFTER each chemotherapy   ONE TOUCH ULTRA 2 w/Device Kit Use to check blood sugars twice a day Dx E11.9   onetouch ultrasoft lancets 1 each by Other route 2 (two) times daily. Use to help check blood sugars twice a day   prochlorperazine 10 MG tablet Commonly known as:  COMPAZINE Take 1 tablet (10 mg total) by mouth every 6 (six) hours as needed (Nausea or vomiting).   ramipril 2.5 MG capsule Commonly known as:  ALTACE TAKE 1 CAPSULE BY MOUTH EVERY DAY   VISION-VITE PRESERVE PO Take 1 tablet by mouth daily.       Allergies:  Allergies  Allergen Reactions  . Tramadol Other (See Comments)     syncope syncope  . Codeine Nausea And Vomiting  . Demerol Nausea Only    Past Medical History, Surgical history, Social history, and Family History were reviewed and updated.  Review of Systems: All other 10 point review of systems is negative.   Physical Exam:  weight is 118 lb 4 oz (53.6 kg). Her oral temperature is 98.1 F (36.7 C). Her blood pressure is 142/69 (abnormal) and her pulse is 81. Her respiration is 16 and oxygen saturation is 95%.   Wt Readings from Last 3 Encounters:  01/31/18 118 lb 4 oz (53.6 kg)  01/20/18 119 lb (54 kg)  01/17/18 118 lb 6 oz (53.7 kg)    Ocular: Sclerae unicteric, pupils equal, round and reactive to light Ear-nose-throat: Oropharynx clear, dentition fair Lymphatic: No cervical, supraclavicular or axillary adenopathy Lungs no rales or rhonchi, good excursion bilaterally Heart regular rate and rhythm, no murmur appreciated Abd soft, nontender, positive bowel sounds, no liver or spleen tip palpated on exam, no fluid wave  MSK no focal spinal tenderness, no joint edema Neuro: non-focal, well-oriented, appropriate affect Breasts: Deferred  Lab Results  Component Value Date   WBC 4.3 01/31/2018   HGB 11.3 (L) 01/31/2018   HCT 34.7 (L) 01/31/2018   MCV 90.1 01/31/2018   PLT 160 01/31/2018   Lab Results  Component Value Date   FERRITIN 514 (H) 04/07/2017   IRON 61 04/07/2017   TIBC 299 04/07/2017   UIBC 238 04/07/2017   IRONPCTSAT 20 (L) 04/07/2017   Lab Results  Component Value Date   RBC 3.85 01/31/2018   No results found for: KPAFRELGTCHN, LAMBDASER, KAPLAMBRATIO No results found for: IGGSERUM, IGA, IGMSERUM No results found for: Odetta Pink, SPEI   Chemistry      Component Value Date/Time   NA 138 01/31/2018 1422   NA 144 04/07/2017 1215   NA 135 (L) 03/11/2017 1416   K 4.0 01/31/2018 1422   K 4.0 04/07/2017 1215   K 3.9 03/11/2017 1416   CL 104 01/31/2018 1422    CL 103 04/07/2017 1215   CO2 30 01/31/2018 1422   CO2 29 04/07/2017 1215   CO2 23 03/11/2017 1416   BUN 13 01/31/2018 1422   BUN 13 04/07/2017 1215   BUN 16.1 03/11/2017 1416   CREATININE 0.70 01/31/2018 1422   CREATININE 0.8 04/07/2017 1215   CREATININE 0.9 03/11/2017 1416      Component Value Date/Time   CALCIUM 9.6 01/31/2018 1422   CALCIUM 9.6 04/07/2017 1215   CALCIUM 9.6 03/11/2017 1416   ALKPHOS 84 01/31/2018 1422   ALKPHOS 60 04/07/2017 1215   ALKPHOS 79 03/11/2017 1416   AST 24 01/31/2018 1422   AST  11 03/11/2017 1416   ALT 18 01/31/2018 1422   ALT 15 04/07/2017 1215   ALT 11 03/11/2017 1416   BILITOT 0.7 01/31/2018 1422   BILITOT 0.26 03/11/2017 1416      Impression and Plan: Samantha Quinn is a very pleasant 84 yo caucasian female with progressive metastatic non small cell adenocarcinoma of the lung. She is here today with complaint of pain in the right side that worsens on inhalation. CT angio confirmed moderate right pleural effusion.  We will get her set up for thoracentesis this week and send specimen for cytology. Patient in agreement with the plan.  She will contact our office with any other questions or concerns. We can certainly see her again if needed.   Laverna Peace, NP 10/7/20195:18 PM

## 2018-01-31 NOTE — Telephone Encounter (Signed)
Patient complaining of not feeling well. She states "things are different". She feels like she needs to be seen. No appointments available with Dr Marin Olp this week. Appointment made with NP.

## 2018-02-03 ENCOUNTER — Other Ambulatory Visit: Payer: Self-pay | Admitting: Family

## 2018-02-03 ENCOUNTER — Encounter (HOSPITAL_COMMUNITY): Payer: Self-pay | Admitting: Radiology

## 2018-02-03 ENCOUNTER — Ambulatory Visit (HOSPITAL_COMMUNITY)
Admission: RE | Admit: 2018-02-03 | Discharge: 2018-02-03 | Disposition: A | Discharge status: Home / Self Care | Payer: Medicare Other | Source: Ambulatory Visit | Attending: Family | Admitting: Family

## 2018-02-03 DIAGNOSIS — R918 Other nonspecific abnormal finding of lung field: Secondary | ICD-10-CM | POA: Diagnosis not present

## 2018-02-03 DIAGNOSIS — C3490 Malignant neoplasm of unspecified part of unspecified bronchus or lung: Secondary | ICD-10-CM

## 2018-02-03 DIAGNOSIS — C7951 Secondary malignant neoplasm of bone: Secondary | ICD-10-CM | POA: Insufficient documentation

## 2018-02-03 DIAGNOSIS — J90 Pleural effusion, not elsewhere classified: Secondary | ICD-10-CM | POA: Diagnosis not present

## 2018-02-03 DIAGNOSIS — J948 Other specified pleural conditions: Secondary | ICD-10-CM | POA: Diagnosis not present

## 2018-02-03 HISTORY — PX: IR THORACENTESIS ASP PLEURAL SPACE W/IMG GUIDE: IMG5380

## 2018-02-03 MED ORDER — LIDOCAINE HCL (PF) 1 % IJ SOLN
INTRAMUSCULAR | Status: AC | PRN
  Administered 2018-02-03: 10 mL

## 2018-02-03 MED ORDER — LIDOCAINE HCL 1 % IJ SOLN
INTRAMUSCULAR | Status: AC
  Filled 2018-02-03: qty 20

## 2018-02-03 NOTE — Procedures (Signed)
PROCEDURE SUMMARY:  Successful US guided right thoracentesis. Yielded 625 mL of clear yellow fluid. Pt tolerated procedure well. No immediate complications.  Specimen was sent for labs. CXR ordered.  Ascencion Dike PA-C 02/03/2018 10:03 AM

## 2018-02-04 ENCOUNTER — Ambulatory Visit (HOSPITAL_COMMUNITY): Payer: Medicare Other

## 2018-02-07 ENCOUNTER — Telehealth: Payer: Self-pay | Admitting: Family

## 2018-02-07 ENCOUNTER — Telehealth: Payer: Self-pay | Admitting: *Deleted

## 2018-02-07 NOTE — Telephone Encounter (Addendum)
Patient is aware of results  ----- Message from [name], MD sent at 02/04/2018  5:09 PM EDT ----- Call - the pleural fluid does not contain any cancer cells.  Samantha Quinn

## 2018-02-07 NOTE — Telephone Encounter (Signed)
I spoke with Samantha Quinn and let her know there were no cancer cells in her pleural fluid. She states that she is feeling better each day. We will plan to see her later this week on Thursday for follow-up.

## 2018-02-10 ENCOUNTER — Ambulatory Visit: Payer: Medicare Other

## 2018-02-10 ENCOUNTER — Other Ambulatory Visit: Payer: Medicare Other

## 2018-02-10 ENCOUNTER — Ambulatory Visit: Payer: Medicare Other | Admitting: Hematology

## 2018-02-11 ENCOUNTER — Ambulatory Visit: Payer: Medicare Other

## 2018-02-16 ENCOUNTER — Other Ambulatory Visit: Payer: Self-pay | Admitting: Family

## 2018-02-17 ENCOUNTER — Inpatient Hospital Stay: Payer: Medicare Other

## 2018-02-17 VITALS — BP 158/67 | HR 88 | Temp 97.8°F | Resp 17

## 2018-02-17 DIAGNOSIS — C3431 Malignant neoplasm of lower lobe, right bronchus or lung: Secondary | ICD-10-CM

## 2018-02-17 DIAGNOSIS — Z5112 Encounter for antineoplastic immunotherapy: Secondary | ICD-10-CM | POA: Diagnosis not present

## 2018-02-17 DIAGNOSIS — C7951 Secondary malignant neoplasm of bone: Secondary | ICD-10-CM | POA: Diagnosis not present

## 2018-02-17 DIAGNOSIS — Z5111 Encounter for antineoplastic chemotherapy: Secondary | ICD-10-CM | POA: Diagnosis not present

## 2018-02-17 DIAGNOSIS — J90 Pleural effusion, not elsewhere classified: Secondary | ICD-10-CM | POA: Diagnosis not present

## 2018-02-17 DIAGNOSIS — R05 Cough: Secondary | ICD-10-CM | POA: Diagnosis not present

## 2018-02-17 LAB — CBC WITH DIFFERENTIAL (CANCER CENTER ONLY)
ABS IMMATURE GRANULOCYTES: 0.05 10*3/uL (ref 0.00–0.07)
BASOS PCT: 0 %
Basophils Absolute: 0 10*3/uL (ref 0.0–0.1)
Eosinophils Absolute: 0.1 10*3/uL (ref 0.0–0.5)
Eosinophils Relative: 1 %
HCT: 35.6 % — ABNORMAL LOW (ref 36.0–46.0)
HEMOGLOBIN: 11.3 g/dL — AB (ref 12.0–15.0)
Immature Granulocytes: 1 %
LYMPHS PCT: 6 %
Lymphs Abs: 0.3 10*3/uL — ABNORMAL LOW (ref 0.7–4.0)
MCH: 28.9 pg (ref 26.0–34.0)
MCHC: 31.7 g/dL (ref 30.0–36.0)
MCV: 91 fL (ref 80.0–100.0)
MONO ABS: 0.5 10*3/uL (ref 0.1–1.0)
MONOS PCT: 11 %
NEUTROS ABS: 4.1 10*3/uL (ref 1.7–7.7)
Neutrophils Relative %: 81 %
Platelet Count: 266 10*3/uL (ref 150–400)
RBC: 3.91 MIL/uL (ref 3.87–5.11)
RDW: 15.9 % — ABNORMAL HIGH (ref 11.5–15.5)
WBC Count: 5.1 10*3/uL (ref 4.0–10.5)
nRBC: 0 % (ref 0.0–0.2)

## 2018-02-17 LAB — CMP (CANCER CENTER ONLY)
ALT: 19 U/L (ref 10–47)
AST: 23 U/L (ref 11–38)
Albumin: 3.4 g/dL — ABNORMAL LOW (ref 3.5–5.0)
Alkaline Phosphatase: 80 U/L (ref 26–84)
Anion gap: 4 — ABNORMAL LOW (ref 5–15)
BUN: 15 mg/dL (ref 7–22)
CO2: 29 mmol/L (ref 18–33)
Calcium: 9.6 mg/dL (ref 8.0–10.3)
Chloride: 107 mmol/L (ref 98–108)
Creatinine: 0.9 mg/dL (ref 0.60–1.20)
GLUCOSE: 112 mg/dL (ref 73–118)
POTASSIUM: 4.4 mmol/L (ref 3.3–4.7)
SODIUM: 140 mmol/L (ref 128–145)
TOTAL PROTEIN: 7.1 g/dL (ref 6.4–8.1)
Total Bilirubin: 0.7 mg/dL (ref 0.2–1.6)

## 2018-02-17 MED ORDER — ONDANSETRON HCL 4 MG/2ML IJ SOLN
8.0000 mg | Freq: Once | INTRAMUSCULAR | Status: AC
  Administered 2018-02-17: 8 mg via INTRAVENOUS

## 2018-02-17 MED ORDER — CYANOCOBALAMIN 1000 MCG/ML IJ SOLN
INTRAMUSCULAR | Status: AC
  Filled 2018-02-17: qty 1

## 2018-02-17 MED ORDER — DEXAMETHASONE SODIUM PHOSPHATE 10 MG/ML IJ SOLN
INTRAMUSCULAR | Status: AC
  Filled 2018-02-17: qty 1

## 2018-02-17 MED ORDER — DEXAMETHASONE SODIUM PHOSPHATE 10 MG/ML IJ SOLN
10.0000 mg | Freq: Once | INTRAMUSCULAR | Status: AC
  Administered 2018-02-17: 10 mg via INTRAVENOUS

## 2018-02-17 MED ORDER — SODIUM CHLORIDE 0.9 % IV SOLN
400.0000 mg/m2 | Freq: Once | INTRAVENOUS | Status: AC
  Administered 2018-02-17: 600 mg via INTRAVENOUS
  Filled 2018-02-17: qty 20

## 2018-02-17 MED ORDER — SODIUM CHLORIDE 0.9 % IV SOLN
Freq: Once | INTRAVENOUS | Status: AC
  Administered 2018-02-17: 10:00:00 via INTRAVENOUS
  Filled 2018-02-17: qty 250

## 2018-02-17 MED ORDER — ONDANSETRON HCL 4 MG/2ML IJ SOLN
INTRAMUSCULAR | Status: AC
  Filled 2018-02-17: qty 4

## 2018-02-17 MED ORDER — CYANOCOBALAMIN 1000 MCG/ML IJ SOLN
1000.0000 ug | Freq: Once | INTRAMUSCULAR | Status: AC
  Administered 2018-02-17: 1000 ug via INTRAMUSCULAR

## 2018-02-17 MED ORDER — SODIUM CHLORIDE 0.9 % IV SOLN
14.8000 mg/kg | Freq: Once | INTRAVENOUS | Status: AC
  Administered 2018-02-17: 800 mg via INTRAVENOUS
  Filled 2018-02-17: qty 32

## 2018-02-17 NOTE — Patient Instructions (Signed)
[facility] Discharge Instructions for Patients Receiving Chemotherapy  Today you received the following chemotherapy agents Alimta and Avastin.  To help prevent nausea and vomiting after your treatment, we encourage you to take your nausea medication as directed.   If you develop nausea and vomiting that is not controlled by your nausea medication, call the clinic.   BELOW ARE SYMPTOMS THAT SHOULD BE REPORTED IMMEDIATELY:  *FEVER GREATER THAN 100.5 F  *CHILLS WITH OR WITHOUT FEVER  NAUSEA AND VOMITING THAT IS NOT CONTROLLED WITH YOUR NAUSEA MEDICATION  *UNUSUAL SHORTNESS OF BREATH  *UNUSUAL BRUISING OR BLEEDING  TENDERNESS IN MOUTH AND THROAT WITH OR WITHOUT PRESENCE OF ULCERS  *URINARY PROBLEMS  *BOWEL PROBLEMS  UNUSUAL RASH Items with * indicate a potential emergency and should be followed up as soon as possible.  Feel free to call the clinic you have any questions or concerns. The clinic phone number is [phone].  Please show the [facility] at check-in to the Emergency Department and triage nurse.

## 2018-03-03 ENCOUNTER — Other Ambulatory Visit: Payer: Medicare Other

## 2018-03-03 ENCOUNTER — Ambulatory Visit: Payer: Medicare Other

## 2018-03-03 ENCOUNTER — Ambulatory Visit: Payer: Medicare Other | Admitting: Hematology & Oncology

## 2018-03-04 ENCOUNTER — Other Ambulatory Visit: Payer: Self-pay | Admitting: Internal Medicine

## 2018-03-10 ENCOUNTER — Inpatient Hospital Stay: Payer: Medicare Other | Attending: Hematology & Oncology

## 2018-03-10 ENCOUNTER — Inpatient Hospital Stay: Payer: Medicare Other

## 2018-03-10 ENCOUNTER — Other Ambulatory Visit: Payer: Self-pay | Admitting: Family

## 2018-03-10 ENCOUNTER — Other Ambulatory Visit: Payer: Self-pay

## 2018-03-10 VITALS — BP 149/57 | HR 80 | Temp 98.2°F | Resp 18

## 2018-03-10 DIAGNOSIS — Z5111 Encounter for antineoplastic chemotherapy: Secondary | ICD-10-CM | POA: Insufficient documentation

## 2018-03-10 DIAGNOSIS — E538 Deficiency of other specified B group vitamins: Secondary | ICD-10-CM

## 2018-03-10 DIAGNOSIS — C7951 Secondary malignant neoplasm of bone: Secondary | ICD-10-CM | POA: Insufficient documentation

## 2018-03-10 DIAGNOSIS — Z5112 Encounter for antineoplastic immunotherapy: Secondary | ICD-10-CM | POA: Insufficient documentation

## 2018-03-10 DIAGNOSIS — C3431 Malignant neoplasm of lower lobe, right bronchus or lung: Secondary | ICD-10-CM | POA: Diagnosis not present

## 2018-03-10 DIAGNOSIS — D508 Other iron deficiency anemias: Secondary | ICD-10-CM | POA: Insufficient documentation

## 2018-03-10 DIAGNOSIS — C3490 Malignant neoplasm of unspecified part of unspecified bronchus or lung: Secondary | ICD-10-CM

## 2018-03-10 LAB — CBC WITH DIFFERENTIAL (CANCER CENTER ONLY)
Abs Immature Granulocytes: 0.03 10*3/uL (ref 0.00–0.07)
BASOS ABS: 0 10*3/uL (ref 0.0–0.1)
Basophils Relative: 0 %
EOS ABS: 0.1 10*3/uL (ref 0.0–0.5)
EOS PCT: 1 %
HEMATOCRIT: 33.8 % — AB (ref 36.0–46.0)
Hemoglobin: 10.8 g/dL — ABNORMAL LOW (ref 12.0–15.0)
IMMATURE GRANULOCYTES: 1 %
LYMPHS ABS: 0.3 10*3/uL — AB (ref 0.7–4.0)
LYMPHS PCT: 5 %
MCH: 29.8 pg (ref 26.0–34.0)
MCHC: 32 g/dL (ref 30.0–36.0)
MCV: 93.1 fL (ref 80.0–100.0)
MONOS PCT: 9 %
Monocytes Absolute: 0.5 10*3/uL (ref 0.1–1.0)
NEUTROS PCT: 84 %
Neutro Abs: 4.9 10*3/uL (ref 1.7–7.7)
Platelet Count: 268 10*3/uL (ref 150–400)
RBC: 3.63 MIL/uL — ABNORMAL LOW (ref 3.87–5.11)
RDW: 15.8 % — AB (ref 11.5–15.5)
WBC Count: 5.9 10*3/uL (ref 4.0–10.5)
nRBC: 0 % (ref 0.0–0.2)

## 2018-03-10 LAB — CMP (CANCER CENTER ONLY)
ALBUMIN: 3.2 g/dL — AB (ref 3.5–5.0)
ALK PHOS: 76 U/L (ref 26–84)
ALT: 18 U/L (ref 10–47)
ANION GAP: 10 (ref 5–15)
AST: 26 U/L (ref 11–38)
BILIRUBIN TOTAL: 0.7 mg/dL (ref 0.2–1.6)
BUN: 15 mg/dL (ref 7–22)
CALCIUM: 9.4 mg/dL (ref 8.0–10.3)
CO2: 27 mmol/L (ref 18–33)
CREATININE: 0.8 mg/dL (ref 0.60–1.20)
Chloride: 102 mmol/L (ref 98–108)
Glucose, Bld: 129 mg/dL — ABNORMAL HIGH (ref 73–118)
Potassium: 3.7 mmol/L (ref 3.3–4.7)
Sodium: 139 mmol/L (ref 128–145)
TOTAL PROTEIN: 7.2 g/dL (ref 6.4–8.1)

## 2018-03-10 LAB — TOTAL PROTEIN, URINE DIPSTICK: Protein, ur: NEGATIVE mg/dL

## 2018-03-10 LAB — VITAMIN B12: VITAMIN B 12: 642 pg/mL (ref 180–914)

## 2018-03-10 MED ORDER — CYANOCOBALAMIN 1000 MCG/ML IJ SOLN
INTRAMUSCULAR | Status: AC
  Filled 2018-03-10: qty 1

## 2018-03-10 MED ORDER — CYANOCOBALAMIN 1000 MCG/ML IJ SOLN
1000.0000 ug | Freq: Once | INTRAMUSCULAR | Status: AC
  Administered 2018-03-10: 1000 ug via INTRAMUSCULAR

## 2018-03-10 MED ORDER — SODIUM CHLORIDE 0.9 % IV SOLN
14.8000 mg/kg | Freq: Once | INTRAVENOUS | Status: AC
  Administered 2018-03-10: 800 mg via INTRAVENOUS
  Filled 2018-03-10: qty 32

## 2018-03-10 MED ORDER — ONDANSETRON HCL 4 MG/2ML IJ SOLN
INTRAMUSCULAR | Status: AC
  Filled 2018-03-10: qty 4

## 2018-03-10 MED ORDER — ONDANSETRON HCL 4 MG/2ML IJ SOLN
8.0000 mg | Freq: Once | INTRAMUSCULAR | Status: AC
  Administered 2018-03-10: 8 mg via INTRAVENOUS

## 2018-03-10 MED ORDER — DEXAMETHASONE SODIUM PHOSPHATE 10 MG/ML IJ SOLN
10.0000 mg | Freq: Once | INTRAMUSCULAR | Status: AC
  Administered 2018-03-10: 10 mg via INTRAVENOUS

## 2018-03-10 MED ORDER — SODIUM CHLORIDE 0.9 % IV SOLN
400.0000 mg/m2 | Freq: Once | INTRAVENOUS | Status: AC
  Administered 2018-03-10: 600 mg via INTRAVENOUS
  Filled 2018-03-10: qty 20

## 2018-03-10 MED ORDER — DEXAMETHASONE SODIUM PHOSPHATE 10 MG/ML IJ SOLN
INTRAMUSCULAR | Status: AC
  Filled 2018-03-10: qty 1

## 2018-03-10 MED ORDER — SODIUM CHLORIDE 0.9 % IV SOLN
Freq: Once | INTRAVENOUS | Status: AC
  Administered 2018-03-10: 12:00:00 via INTRAVENOUS
  Filled 2018-03-10: qty 250

## 2018-03-10 NOTE — Progress Notes (Signed)
Dr. Marin Olp will hold Zometa today.  Patient had dental extraction in August.

## 2018-03-10 NOTE — Patient Instructions (Addendum)
[facility] Discharge Instructions for Patients Receiving Chemotherapy  Today you received the following chemotherapy agents Alimta and Avastin. To help prevent nausea and vomiting after your treatment, we encourage you to take your nausea medication as prescribed.  If you develop nausea and vomiting that is not controlled by your nausea medication, call the clinic.   BELOW ARE SYMPTOMS THAT SHOULD BE REPORTED IMMEDIATELY:  *FEVER GREATER THAN 100.5 F  *CHILLS WITH OR WITHOUT FEVER  NAUSEA AND VOMITING THAT IS NOT CONTROLLED WITH YOUR NAUSEA MEDICATION  *UNUSUAL SHORTNESS OF BREATH  *UNUSUAL BRUISING OR BLEEDING  TENDERNESS IN MOUTH AND THROAT WITH OR WITHOUT PRESENCE OF ULCERS  *URINARY PROBLEMS  *BOWEL PROBLEMS  UNUSUAL RASH Items with * indicate a potential emergency and should be followed up as soon as possible.  Feel free to call the clinic should you have any questions or concerns. The clinic phone number is [phone].  Please show the [facility] at check-in to the Emergency Department and triage nurse.

## 2018-03-20 ENCOUNTER — Other Ambulatory Visit: Payer: Self-pay | Admitting: Internal Medicine

## 2018-03-23 ENCOUNTER — Ambulatory Visit: Payer: Medicare Other | Admitting: Hematology & Oncology

## 2018-03-23 ENCOUNTER — Ambulatory Visit: Payer: Medicare Other

## 2018-03-23 ENCOUNTER — Other Ambulatory Visit: Payer: Medicare Other

## 2018-03-28 ENCOUNTER — Emergency Department (HOSPITAL_BASED_OUTPATIENT_CLINIC_OR_DEPARTMENT_OTHER)
Admission: EM | Admit: 2018-03-28 | Discharge: 2018-03-28 | Disposition: A | Discharge status: Home / Self Care | Payer: Medicare Other | Attending: Emergency Medicine | Admitting: Emergency Medicine

## 2018-03-28 ENCOUNTER — Encounter (HOSPITAL_BASED_OUTPATIENT_CLINIC_OR_DEPARTMENT_OTHER): Payer: Self-pay | Admitting: *Deleted

## 2018-03-28 ENCOUNTER — Telehealth: Payer: Self-pay

## 2018-03-28 ENCOUNTER — Telehealth: Payer: Self-pay | Admitting: *Deleted

## 2018-03-28 ENCOUNTER — Other Ambulatory Visit: Payer: Self-pay

## 2018-03-28 DIAGNOSIS — F419 Anxiety disorder, unspecified: Secondary | ICD-10-CM | POA: Insufficient documentation

## 2018-03-28 DIAGNOSIS — Z85818 Personal history of malignant neoplasm of other sites of lip, oral cavity, and pharynx: Secondary | ICD-10-CM | POA: Diagnosis not present

## 2018-03-28 DIAGNOSIS — R220 Localized swelling, mass and lump, head: Secondary | ICD-10-CM | POA: Diagnosis not present

## 2018-03-28 DIAGNOSIS — E119 Type 2 diabetes mellitus without complications: Secondary | ICD-10-CM | POA: Insufficient documentation

## 2018-03-28 DIAGNOSIS — I10 Essential (primary) hypertension: Secondary | ICD-10-CM | POA: Insufficient documentation

## 2018-03-28 DIAGNOSIS — Z79899 Other long term (current) drug therapy: Secondary | ICD-10-CM | POA: Diagnosis not present

## 2018-03-28 DIAGNOSIS — E785 Hyperlipidemia, unspecified: Secondary | ICD-10-CM | POA: Diagnosis not present

## 2018-03-28 NOTE — ED Notes (Signed)
Airway intact and no resp distress. Pt oriented to call light and instructed to call out if swelling becomes worse. Pt in NAD at this time.

## 2018-03-28 NOTE — ED Notes (Signed)
Signature pad in room not working. Pt provided verbal understanding of discharge instructions with teach back.

## 2018-03-28 NOTE — Telephone Encounter (Signed)
Patient called and stated, "I woke up this morning and my tongue is swollen. I have trouble talking correctly." I instructed patient to go to the closest ER to be assessed. Told her not to drive, ask her cleaning lady to take her to the ER. She verbalized understanding.

## 2018-03-28 NOTE — Telephone Encounter (Signed)
Copied from [facility] [id number]. Topic: General - Other >> Mar 28, 2018  2:51 PM Carolyn Stare wrote:   Pt went to the ER today because her tongue swelled,The ER doctor told her that the med RAMIPRIL may have cause the ANGIOEDEMA and to stop taking it . She would like a call back the med is not showing on her med list .

## 2018-03-28 NOTE — ED Triage Notes (Signed)
Woke with swelling in her tongue. She was able to eat breakfast. She is able to speak with a clear voice. She is a chemo pt. Hx of lung cancer.

## 2018-03-28 NOTE — Telephone Encounter (Signed)
Yes, she should stay off of it.  Ideally she should monitor her BP until Friday and call sooner if elevated.

## 2018-03-28 NOTE — ED Provider Notes (Signed)
[facility] EMERGENCY DEPARTMENT Provider Note   CSN: [id number] Arrival date & time: 03/28/18  1216     History   Chief Complaint Chief Complaint  Patient presents with  . Tongue Swelling    HPI Samantha Quinn is a 84 y.o. female.  Patient is an 84 year old who presents the ED with tongue swelling.  Patient has history of throat cancer status post treatment at this time.  Patient woke up about 6 hours ago with some mild right-sided tongue swelling.  She has had a dry mouth for the last several days.  She had tried a new toothpaste over the weekend and woke up with mild tongue swelling.  She denies any shortness of breath, difficulty swallowing.  Patient is on ramipril for blood pressure for multiple years.  No history of this in the past.  Overall patient is comfortable.  The history is provided by the patient.  Illness  This is a new problem. The current episode started 6 to 12 hours ago. The problem occurs constantly. The problem has been gradually improving. Pertinent negatives include no chest pain, no abdominal pain, no headaches and no shortness of breath. Nothing aggravates the symptoms. Nothing relieves the symptoms. She has tried nothing for the symptoms. The treatment provided no relief.    Past Medical History:  Diagnosis Date  . Anxiety   . Back pain    bulding disc  . Bronchitis    hx of-many,many years   . Diabetes mellitus   . Diabetes mellitus    type 2 and takes Metformin bid  . Dyslipidemia   . Embolism - blood clot    hx of in left lower leg and was on Coumadin for about 40month;this was about 8-134yrago  . Family history of adverse reaction to anesthesia    Daughter- Nausea  . Goals of care, counseling/discussion 11/12/2017  . History of colonic polyps   . History of radiation therapy 01/06/16-02/19/16   The primary tumor and involved mediastinal adenopathy were treated to 66 Gy in 33 fractions of 2 Gy.  . Marland Kitchenistory of radiation therapy  04/01/17-04/26/17   cervical spine 35 Gy in 14 fractions  . HOH (hard of hearing)   . Hx of migraines    as a teenager  . Hypercholesterolemia   . Hyperlipidemia    takes Lovastatin every other day  . Hypertension    takes Ziac daily and Ramipril as well  . Hyperthyroidism   . Hypothyroidism    takes Synthroid every other day  . Insomnia    takes Elavil nightly  . Joint pain   . Lung mass   . Myalgia   . Osteoarthritis   . Osteoporosis   . Pneumothorax of right lung after biopsy 07/09/11   HAD CHEST TUBE PLACEMENT  . PONV (postoperative nausea and vomiting)   . Primary adenocarcinoma of lower lobe of right lung ([place]   pT2a, pN0 M0 Stage 1 Well differenced Adenocarcinoma 3.5 cm resected 07/17/2011  . Primary adenocarcinoma of lung ([facility]/27/2013  . Shortness of breath dyspnea    With exertion  . Skin cancer    legs have dark spots on them  . Upper respiratory infection 04/2011  . Urinary incontinence    takes Detrol daily prn    Patient Active Problem List   Diagnosis Date Noted  . Vitamin B 12 deficiency 03/10/2018  . Goals of care, counseling/discussion 11/12/2017  . IDA (iron deficiency anemia) 03/12/2017  . Radiculopathy 02/17/2017  .  Lung cancer metastatic to bone ([place]) 02/05/2017  . Thyrotoxicosis without thyroid storm 01/18/2017  . Bilateral hearing loss 08/27/2016  . Knee injury 08/27/2016  . Toxic multinodular goiter w/o crisis 06/22/2016  . Fatigue 05/05/2016  . Macular degeneration 03/09/2016  . Encounter for antineoplastic chemotherapy 12/19/2015  . Meralgia paresthetica of left side 07/10/2015  . Osteoporosis 07/10/2015  . Onychomycosis 07/10/2015  . Anxiety 07/10/2015  . Diabetes ([place]) 07/10/2015  . Cough 12/12/2013  . Pneumothorax after biopsy 07/09/2011    Class: Acute  . Primary adenocarcinoma of lower lobe of right lung ([place]) 06/24/2011  . Hypercholesterolemia 10/20/2010  . Essential hypertension, benign 10/20/2010  . Osteoarthritis  10/20/2010    Past Surgical History:  Procedure Laterality Date  . ABDOMINAL HYSTERECTOMY    . ANTERIOR CERVICAL DECOMP/DISCECTOMY FUSION N/A 02/17/2017   Procedure: ANTERIOR CERVICAL DECOMPRESSION FUSION, CERVICAL 6-7 WITH INSTRUMENTATION AND ALLOGRAFT;  Surgeon: Phylliss Bob, MD;  Location: [facility];  Service: Orthopedics;  Laterality: N/A;  ANTERIOR CERVICAL DECOMPRESSION FUSION, CERVICAL 6-7 WITH INSTRUMENTATION AND ALLOGRAFT; REQUEST 2 HOURS AND FLIP   . APPENDECTOMY     as a child  . CHEST TUBE REMOVAL  07/13/11   RIGHT  CHEST TUBE  . CHOLECYSTECTOMY    . COLONOSCOPY    . COLONOSCOPY    . DILATION AND CURETTAGE OF UTERUS     x 2  . EYE SURGERY Bilateral    Cataract  . HEMORRHOID SURGERY    . IR THORACENTESIS ASP PLEURAL SPACE W/IMG GUIDE  02/03/2018  . LUNG BIOPSY  07/09/11   RIGHT UPER LOBE LUNG MASS   . LUNG LOBECTOMY Right 06/2011  . POSTERIOR CERVICAL FUSION/FORAMINOTOMY N/A 02/18/2017   Procedure: CERVICAL Five-Six, CERVICAL Six-Seven, CERVICAL Seven-THORACIC One, THORACIC One-Two POSTERIOR SPINAL FUSION WITH INSTRUMENTATION AND ALLOGRAFT;  Surgeon: Phylliss Bob, MD;  Location: [facility];  Service: Orthopedics;  Laterality: N/A;  . RIGHT CHEST TUBE PLACEMENT  07/10/11   S/P LUNG BX  . ROTATOR CUFF REPAIR     bilateral  . TOE SURGERY  2013   right small toe  . TONSILLECTOMY     as a child  . VIDEO BRONCHOSCOPY  07/17/2011   Procedure: VIDEO BRONCHOSCOPY;  Surgeon: Grace Isaac, MD;  Location: [facility];  Service: Thoracic;  Laterality: N/A;  . VIDEO BRONCHOSCOPY WITH ENDOBRONCHIAL ULTRASOUND N/A 12/10/2015   Procedure: VIDEO BRONCHOSCOPY WITH ENDOBRONCHIAL ULTRASOUND with biopsies.;  Surgeon: Grace Isaac, MD;  Location: [facility];  Service: Thoracic;  Laterality: N/A;     OB History   None      Home Medications    Prior to Admission medications   Medication Sig Start Date End Date Taking? Authorizing Provider  amitriptyline (ELAVIL) 10 MG tablet TAKE 1 TABLET BY  MOUTH EVERYDAY AT BEDTIME 05/10/17   Binnie Rail, MD  bisoprolol-hydrochlorothiazide (ZIAC) 2.5-6.25 MG tablet TAKE 1 TABLET BY MOUTH EVERY DAY 03/21/18   Burns, Claudina Lick, MD  Blood Glucose Monitoring Suppl (ONE TOUCH ULTRA 2) w/Device KIT Use to check blood sugars twice a day Dx E11.9 01/07/16   Binnie Rail, MD  calcium carbonate (OS-CAL) 600 MG tablet Take 600 mg by mouth daily.    [provider]  cholecalciferol (VITAMIN D) 1000 units tablet Take 2,000 Units by mouth daily.     [provider]  dexamethasone (DECADRON) 4 MG tablet Take 1 tab two times a day for 4 days starting day AFTER each chemotherapy. 01/20/18   [name], MD  folic acid (FOLVITE) 1 MG tablet Take 1 tablet (1 mg total) by mouth daily. Start 5-7 days before Alimta chemotherapy. Continue until 21 days after Alimta completed. 12/30/17   [name], MD  gabapentin (NEURONTIN) 100 MG capsule Take 100 mg by mouth 3 (three) times daily.     [provider]  glucose blood (ONE TOUCH ULTRA TEST) test strip 1 each by Other route 2 (two) times daily. Use to check blood sugars twice a day 07/31/16   Binnie Rail, MD  HYDROcodone-homatropine [facility]) 5-1.5 MG/5ML syrup Take 5 mLs by mouth every 6 (six) hours as needed for cough. 01/20/18   [name], MD  Lancets [facility] ULTRASOFT) lancets 1 each by Other route 2 (two) times daily. Use to help check blood sugars twice a day 07/31/16   Binnie Rail, MD  LORazepam (ATIVAN) 0.5 MG tablet Take 1 tablet (0.5 mg total) by mouth every 6 (six) hours as needed (Nausea or vomiting). Patient not taking: Reported on 01/17/2018 12/30/17   [name], MD  lovastatin (MEVACOR) 20 MG tablet TAKE 1 TABLET BY MOUTH EVERY OTHER DAY 10/04/17   Binnie Rail, MD  metFORMIN (GLUCOPHAGE) 500 MG tablet Take 1 tablet (500 mg total) by mouth 2 (two) times daily with a meal. 11/08/17   Burns, Claudina Lick, MD  methimazole (TAPAZOLE) 5 MG tablet Take 2.5 mg every  day  10/29/17   Philemon Kingdom, MD  methimazole (TAPAZOLE) 5 MG tablet ALTERNATE TAKING 1 TABLET AND 1/2 TABLET EACH DAY 03/04/18   Philemon Kingdom, MD  Multiple Vitamins-Minerals (VISION-VITE PRESERVE PO) Take 1 tablet by mouth daily.     [provider]  ondansetron (ZOFRAN) 8 MG tablet Take 1 tablet twice a day for 4 days starting day AFTER each chemotherapy 01/20/18   [name], MD  Probiotic Product (ALIGN PO) Take by mouth daily.    [provider]  prochlorperazine (COMPAZINE) 10 MG tablet Take 1 tablet (10 mg total) by mouth every 6 (six) hours as needed (Nausea or vomiting). Patient not taking: Reported on 01/17/2018 12/30/17   [name], MD    Family History Family History  Problem Relation Age of Onset  . Hypertension Mother   . Hypertension Father   . Arthritis Sister   . Arthritis Sister   . Diabetes Brother   . Hypertension Sister   . Anesthesia problems Neg Hx   . Hypotension Neg Hx   . Malignant hyperthermia Neg Hx   . Pseudochol deficiency Neg Hx   . Heart attack Neg Hx     Social History Social History   Tobacco Use  . Smoking status: Never Smoker  . Smokeless tobacco: Never Used  Substance Use Topics  . Alcohol use: No    Alcohol/week: 0.0 standard drinks    Comment: "rarely have a glass of wine"  . Drug use: No     Allergies   Tramadol; Codeine; and Demerol   Review of Systems Review of Systems  Constitutional: Negative for chills and fever.  HENT: Negative for congestion, dental problem, drooling, ear discharge, ear pain, facial swelling, hearing loss, rhinorrhea, sinus pressure, sinus pain, sneezing, sore throat, tinnitus, trouble swallowing and voice change.   Eyes: Negative for pain and visual disturbance.  Respiratory: Negative for cough, shortness of breath, wheezing and stridor.   Cardiovascular: Negative for chest pain and palpitations.  Gastrointestinal: Negative for abdominal pain and vomiting.  Genitourinary:  Negative for dysuria and hematuria.  Musculoskeletal: Negative for arthralgias and back pain.  Skin: Negative for color change and rash.  Neurological: Negative for seizures, syncope and headaches.  All other systems reviewed and are negative.    Physical Exam Updated Vital Signs BP (!) 188/83   Pulse 84   Temp 98.4 F (36.9 C) (Oral)   Resp 18   Ht 5' 1" (1.549 m)   Wt 52.2 kg   SpO2 97%   BMI 21.73 kg/m   Physical Exam  Constitutional: She appears well-developed and well-nourished. No distress.  HENT:  Head: Normocephalic and atraumatic.  Patient has mild tongue swelling to the lateral side of the right side of the tongue, otherwise there is no swelling to the throat area, uvula, tonsils, there is no swelling to the left side of the tongue, the floor of the mouth is soft, no stridor, no drooling, no trismus  Eyes: Pupils are equal, round, and reactive to light. Conjunctivae and EOM are normal.  Neck: Normal range of motion. Neck supple.  Cardiovascular: Normal rate, regular rhythm, normal heart sounds and intact distal pulses.  No murmur heard. Pulmonary/Chest: Effort normal and breath sounds normal. No respiratory distress.  Abdominal: Soft. Bowel sounds are normal. She exhibits no distension. There is no tenderness.  Musculoskeletal: She exhibits no edema.  Neurological: She is alert.  Skin: Skin is warm and dry. Capillary refill takes less than 2 seconds.  Psychiatric: She has a normal mood and affect.  Nursing note and vitals reviewed.    ED Treatments / Results  Labs (all labs ordered are listed, but only abnormal results are displayed) Labs Reviewed - No data to display  EKG None  Radiology No results found.  Procedures Procedures (including critical care time)  Medications Ordered in ED Medications - No data to display   Initial Impression / Assessment and Plan / ED Course  I have reviewed the triage vital signs and the nursing notes.  Pertinent  labs & imaging results that were available during my care of the patient were reviewed by me and considered in my medical decision making (see chart for details).     Samantha Quinn is an 84 year old female with history of diabetes, hypertension, throat cancer who presents to the ED with tongue swelling.  Patient with mild hypertension but otherwise normal vitals.  Patient woke up about 6 to 7 hours ago and has some mild swelling to the right side of her tongue that she states has improved.  She states that she did use a new toothpaste this past weekend.  She is on ramipril.  She states that this is never happened before.  She denies any difficulty swallowing, breathing.  There is no stridor, wheezing, respiratory distress on exam.  The floor of the mouth is soft.  There is some mild swelling to the right side of the tongue but otherwise there is no swelling of the lips, left side of the tongue, posterior pharynx.  Suspect mild angioedema from ramipril use or from toothpaste.  No signs to suggest anaphylaxis.  Patient would like to avoid Benadryl or prednisone given diabetes and her age.  I believe that this is acceptable as patient appears to be improving.  Patient was observed in the ED and symptoms continue to improve.  She was able to drink without any issues.  Shared decision was made and patient was discharged from the ED with strict return precautions.  Recommend discontinue ramipril and use of that toothpaste.  She has close follow-up  already with primary care doctor this week.  Told her return to the ED if symptoms worsen.  This chart was dictated using voice recognition software.  Despite best efforts to proofread,  errors can occur which can change the documentation meaning.   Final Clinical Impressions(s) / ED Diagnoses   Final diagnoses:  Mild tongue swelling    ED Discharge Orders    None       Lennice Sites, DO 03/28/18 1352

## 2018-03-28 NOTE — Telephone Encounter (Signed)
Spoke with pt and she was told to check with you to make sure it was ok to stay off the ramipril. She has an appointment with you Friday. She states she has been on that medication for many years and just did not want her blood pressure to go up.

## 2018-03-29 NOTE — Telephone Encounter (Signed)
Pt aware of response.

## 2018-03-30 ENCOUNTER — Ambulatory Visit: Payer: Self-pay | Admitting: *Deleted

## 2018-03-30 ENCOUNTER — Other Ambulatory Visit: Payer: Self-pay | Admitting: *Deleted

## 2018-03-30 DIAGNOSIS — C7951 Secondary malignant neoplasm of bone: Principal | ICD-10-CM

## 2018-03-30 DIAGNOSIS — C3490 Malignant neoplasm of unspecified part of unspecified bronchus or lung: Secondary | ICD-10-CM

## 2018-03-30 MED ORDER — AMLODIPINE BESYLATE 2.5 MG PO TABS: 2.5000 mg | ORAL_TABLET | Freq: Every day | ORAL | 0 refills | Status: DC

## 2018-03-30 NOTE — Addendum Note (Signed)
Addended by: Binnie Rail on: 03/30/2018 02:05 PM   Modules accepted: Orders

## 2018-03-30 NOTE — Telephone Encounter (Signed)
Taken on left arm at 0700 1130 on upper arm; she said that she was taking ramipril but it was d/c'd because the ED thought that the medication may be causing tongue swelling; she says her BP is 130's/70's; recommendations made per nurse triage protocol; the pt is scheduled to see Dr Quay Burow, [place] on 03/02/18; she verbalized understanding; the pt can be contacted at [phone]; she also verifies that her pharmacy of choice is [facility]; will route to office for provider review.    Patient called to say that her BP is elevated (BP 03/30/18 158/94 and 165/90 ) Patient stated that she is not experiencing any symptoms but is worried that because the doctor took her off one BP med that is what is causing the elevation. Ph# [phone]     Reason for Disposition . Systolic BP  >= 170 OR Diastolic >= 017  Answer Assessment - Initial Assessment Questions 1. BLOOD PRESSURE: "What is the blood pressure?" "Did you take at least two measurements 5 minutes apart?"     158/94 and 165/90 2. ONSET: "When did you take your blood pressure?"     03/30/18 at 0700 and 1130 3. HOW: "How did you obtain the blood pressure?" (e.g., visiting nurse, automatic home BP monitor)     Automatic home cuff on left arm 4. HISTORY: "Do you have a history of high blood pressure?"     yes 5. MEDICATIONS: "Are you taking any medications for blood pressure?" "Have you missed any doses recently?"     No missed doses; ramipramil d/c'd on 03/28/18  6. OTHER SYMPTOMS: "Do you have any symptoms?" (e.g., headache, chest pain, blurred vision, difficulty breathing, weakness)     no 7. PREGNANCY: "Is there any chance you are pregnant?" "When was your last menstrual period?"     no  Protocols used: HIGH BLOOD PRESSURE-A-AH

## 2018-03-30 NOTE — Addendum Note (Signed)
Addended by: Delice Bison E on: 03/30/2018 04:01 PM   Modules accepted: Orders

## 2018-03-30 NOTE — Telephone Encounter (Signed)
Pt has appt with Burns on 04/01/18

## 2018-03-30 NOTE — Telephone Encounter (Signed)
Rx sent. Pt is aware and with call with any issues with meds if needed.

## 2018-03-30 NOTE — Telephone Encounter (Signed)
Start amlodipine 2.5 mg daily.  Since BP in 150-160's.     rx pending.

## 2018-03-31 ENCOUNTER — Encounter: Payer: Self-pay | Admitting: Hematology & Oncology

## 2018-03-31 ENCOUNTER — Inpatient Hospital Stay: Payer: Medicare Other | Attending: Hematology & Oncology

## 2018-03-31 ENCOUNTER — Other Ambulatory Visit: Payer: Self-pay

## 2018-03-31 ENCOUNTER — Ambulatory Visit (HOSPITAL_BASED_OUTPATIENT_CLINIC_OR_DEPARTMENT_OTHER)
Admission: RE | Admit: 2018-03-31 | Discharge: 2018-03-31 | Disposition: A | Discharge status: Home / Self Care | Payer: Medicare Other | Source: Ambulatory Visit | Attending: Hematology & Oncology | Admitting: Hematology & Oncology

## 2018-03-31 ENCOUNTER — Inpatient Hospital Stay (HOSPITAL_BASED_OUTPATIENT_CLINIC_OR_DEPARTMENT_OTHER): Payer: Medicare Other | Admitting: Hematology & Oncology

## 2018-03-31 ENCOUNTER — Inpatient Hospital Stay: Payer: Medicare Other

## 2018-03-31 VITALS — BP 166/89 | HR 83 | Temp 97.5°F | Resp 17 | Wt 116.0 lb

## 2018-03-31 DIAGNOSIS — R5383 Other fatigue: Secondary | ICD-10-CM | POA: Insufficient documentation

## 2018-03-31 DIAGNOSIS — Z5111 Encounter for antineoplastic chemotherapy: Secondary | ICD-10-CM | POA: Diagnosis not present

## 2018-03-31 DIAGNOSIS — Z7984 Long term (current) use of oral hypoglycemic drugs: Secondary | ICD-10-CM | POA: Diagnosis not present

## 2018-03-31 DIAGNOSIS — C3490 Malignant neoplasm of unspecified part of unspecified bronchus or lung: Secondary | ICD-10-CM

## 2018-03-31 DIAGNOSIS — Z923 Personal history of irradiation: Secondary | ICD-10-CM | POA: Insufficient documentation

## 2018-03-31 DIAGNOSIS — R0602 Shortness of breath: Secondary | ICD-10-CM | POA: Insufficient documentation

## 2018-03-31 DIAGNOSIS — Z5112 Encounter for antineoplastic immunotherapy: Secondary | ICD-10-CM | POA: Insufficient documentation

## 2018-03-31 DIAGNOSIS — C7951 Secondary malignant neoplasm of bone: Secondary | ICD-10-CM | POA: Diagnosis not present

## 2018-03-31 DIAGNOSIS — Z79899 Other long term (current) drug therapy: Secondary | ICD-10-CM | POA: Diagnosis not present

## 2018-03-31 DIAGNOSIS — C3431 Malignant neoplasm of lower lobe, right bronchus or lung: Secondary | ICD-10-CM | POA: Insufficient documentation

## 2018-03-31 DIAGNOSIS — E538 Deficiency of other specified B group vitamins: Secondary | ICD-10-CM

## 2018-03-31 DIAGNOSIS — D500 Iron deficiency anemia secondary to blood loss (chronic): Secondary | ICD-10-CM | POA: Diagnosis not present

## 2018-03-31 DIAGNOSIS — E119 Type 2 diabetes mellitus without complications: Secondary | ICD-10-CM | POA: Diagnosis not present

## 2018-03-31 DIAGNOSIS — D508 Other iron deficiency anemias: Secondary | ICD-10-CM

## 2018-03-31 DIAGNOSIS — R079 Chest pain, unspecified: Secondary | ICD-10-CM | POA: Diagnosis not present

## 2018-03-31 LAB — CBC WITH DIFFERENTIAL (CANCER CENTER ONLY)
ABS IMMATURE GRANULOCYTES: 0.04 10*3/uL (ref 0.00–0.07)
BASOS ABS: 0 10*3/uL (ref 0.0–0.1)
BASOS PCT: 0 %
Eosinophils Absolute: 0 10*3/uL (ref 0.0–0.5)
Eosinophils Relative: 0 %
HEMATOCRIT: 36.1 % (ref 36.0–46.0)
Hemoglobin: 11.3 g/dL — ABNORMAL LOW (ref 12.0–15.0)
Immature Granulocytes: 1 %
LYMPHS ABS: 0.4 10*3/uL — AB (ref 0.7–4.0)
Lymphocytes Relative: 7 %
MCH: 29.8 pg (ref 26.0–34.0)
MCHC: 31.3 g/dL (ref 30.0–36.0)
MCV: 95.3 fL (ref 80.0–100.0)
MONOS PCT: 8 %
Monocytes Absolute: 0.5 10*3/uL (ref 0.1–1.0)
NEUTROS ABS: 4.7 10*3/uL (ref 1.7–7.7)
NEUTROS PCT: 84 %
NRBC: 0 % (ref 0.0–0.2)
PLATELETS: 299 10*3/uL (ref 150–400)
RBC: 3.79 MIL/uL — ABNORMAL LOW (ref 3.87–5.11)
RDW: 15.9 % — ABNORMAL HIGH (ref 11.5–15.5)
WBC Count: 5.7 10*3/uL (ref 4.0–10.5)

## 2018-03-31 LAB — CMP (CANCER CENTER ONLY)
ALBUMIN: 4.2 g/dL (ref 3.5–5.0)
ALT: 11 U/L (ref 0–44)
ANION GAP: 9 (ref 5–15)
AST: 18 U/L (ref 15–41)
Alkaline Phosphatase: 84 U/L (ref 38–126)
BUN: 13 mg/dL (ref 8–23)
CO2: 28 mmol/L (ref 22–32)
Calcium: 9.2 mg/dL (ref 8.9–10.3)
Chloride: 96 mmol/L — ABNORMAL LOW (ref 98–111)
Creatinine: 0.7 mg/dL (ref 0.44–1.00)
GFR, Est AFR Am: >60 mL/min (ref >60)
GFR, Estimated: >60 mL/min (ref >60)
GLUCOSE: 140 mg/dL — AB (ref 70–99)
POTASSIUM: 3.7 mmol/L (ref 3.5–5.1)
SODIUM: 133 mmol/L — AB (ref 135–145)
Total Bilirubin: 0.4 mg/dL (ref 0.3–1.2)
Total Protein: 7.1 g/dL (ref 6.5–8.1)

## 2018-03-31 MED ORDER — CYANOCOBALAMIN 1000 MCG/ML IJ SOLN
1000.0000 ug | Freq: Once | INTRAMUSCULAR | Status: AC
  Administered 2018-03-31: 1000 ug via INTRAMUSCULAR

## 2018-03-31 MED ORDER — SODIUM CHLORIDE 0.9 % IV SOLN
Freq: Once | INTRAVENOUS | Status: AC
  Administered 2018-03-31: 12:00:00 via INTRAVENOUS
  Filled 2018-03-31: qty 250

## 2018-03-31 MED ORDER — DEXAMETHASONE SODIUM PHOSPHATE 10 MG/ML IJ SOLN
10.0000 mg | Freq: Once | INTRAMUSCULAR | Status: AC
  Administered 2018-03-31: 10 mg via INTRAVENOUS

## 2018-03-31 MED ORDER — SODIUM CHLORIDE 0.9 % IV SOLN
14.8000 mg/kg | Freq: Once | INTRAVENOUS | Status: AC
  Administered 2018-03-31: 800 mg via INTRAVENOUS
  Filled 2018-03-31: qty 32

## 2018-03-31 MED ORDER — ZOLEDRONIC ACID 4 MG/5ML IV CONC
3.0000 mg | Freq: Once | INTRAVENOUS | Status: AC
  Administered 2018-03-31: 3 mg via INTRAVENOUS
  Filled 2018-03-31: qty 3.75

## 2018-03-31 MED ORDER — ONDANSETRON HCL 4 MG/2ML IJ SOLN
8.0000 mg | Freq: Once | INTRAMUSCULAR | Status: AC
  Administered 2018-03-31: 8 mg via INTRAVENOUS

## 2018-03-31 MED ORDER — CYANOCOBALAMIN 1000 MCG/ML IJ SOLN
INTRAMUSCULAR | Status: AC
  Filled 2018-03-31: qty 1

## 2018-03-31 MED ORDER — DEXAMETHASONE SODIUM PHOSPHATE 10 MG/ML IJ SOLN
INTRAMUSCULAR | Status: AC
  Filled 2018-03-31: qty 1

## 2018-03-31 MED ORDER — SODIUM CHLORIDE 0.9 % IV SOLN
400.0000 mg/m2 | Freq: Once | INTRAVENOUS | Status: AC
  Administered 2018-03-31: 600 mg via INTRAVENOUS
  Filled 2018-03-31: qty 20

## 2018-03-31 MED ORDER — ONDANSETRON HCL 4 MG/2ML IJ SOLN
INTRAMUSCULAR | Status: AC
  Filled 2018-03-31: qty 4

## 2018-03-31 NOTE — Progress Notes (Signed)
Subjective:    Patient ID: Samantha Quinn, female    DOB: [birth year], 84 y.o.   MRN: [id number]  HPI The patient is here for follow up.  Hypertension: She had an episode of tongue swelling recently and was taken off the ramipril in the emergency room.  Her blood pressure has been elevated and I did start her on amlodipine.  She is taking her medication daily as prescribed.  She is compliant with a low-sodium diet.  Diabetes: She is taking her medication daily as prescribed. She is compliant with a diabetic diet. She is exercising regularly.   Hyperlipidemia: She is taking her medication daily. She is compliant with a low fat/cholesterol diet. She is exercising regularly. She denies myalgias.   Metastatic lung cancer: She is receiving chemotherapy.  Her last treatment was yesterday.  She has a PET scan in January.  She has some lower back pain.      Medications and allergies reviewed with patient and updated if appropriate.  Patient Active Problem List   Diagnosis Date Noted  . Vitamin B 12 deficiency 03/10/2018  . Goals of care, counseling/discussion 11/12/2017  . IDA (iron deficiency anemia) 03/12/2017  . Radiculopathy 02/17/2017  . Lung cancer metastatic to bone ([place]) 02/05/2017  . Thyrotoxicosis without thyroid storm 01/18/2017  . Bilateral hearing loss 08/27/2016  . Knee injury 08/27/2016  . Toxic multinodular goiter w/o crisis 06/22/2016  . Fatigue 05/05/2016  . Macular degeneration 03/09/2016  . Encounter for antineoplastic chemotherapy 12/19/2015  . Meralgia paresthetica of left side 07/10/2015  . Osteoporosis 07/10/2015  . Onychomycosis 07/10/2015  . Anxiety 07/10/2015  . Diabetes ([place]) 07/10/2015  . Cough 12/12/2013  . Pneumothorax after biopsy 07/09/2011    Class: Acute  . Primary adenocarcinoma of lower lobe of right lung ([place]) 06/24/2011  . Hypercholesterolemia 10/20/2010  . Essential hypertension, benign 10/20/2010  . Osteoarthritis 10/20/2010    Current  Outpatient Medications on File Prior to Visit  Medication Sig Dispense Refill  . amitriptyline (ELAVIL) 10 MG tablet TAKE 1 TABLET BY MOUTH EVERYDAY AT BEDTIME 90 tablet 1  . amLODipine (NORVASC) 2.5 MG tablet Take 1 tablet (2.5 mg total) by mouth daily. 30 tablet 0  . bisoprolol-hydrochlorothiazide (ZIAC) 2.5-6.25 MG tablet TAKE 1 TABLET BY MOUTH EVERY DAY 90 tablet 0  . Blood Glucose Monitoring Suppl (ONE TOUCH ULTRA 2) w/Device KIT Use to check blood sugars twice a day Dx E11.9 1 each 0  . calcium carbonate (OS-CAL) 600 MG tablet Take 600 mg by mouth daily.    . cholecalciferol (VITAMIN D) 1000 units tablet Take 2,000 Units by mouth daily.     [name] dexamethasone (DECADRON) 4 MG tablet Take 1 tab two times a day for 4 days starting day AFTER each chemotherapy. 40 tablet 4  . folic acid (FOLVITE) 1 MG tablet Take 1 tablet (1 mg total) by mouth daily. Start 5-7 days before Alimta chemotherapy. Continue until 21 days after Alimta completed. 100 tablet 3  . gabapentin (NEURONTIN) 100 MG capsule Take 100 mg by mouth 3 (three) times daily.     [name] glucose blood (ONE TOUCH ULTRA TEST) test strip 1 each by Other route 2 (two) times daily. Use to check blood sugars twice a day 100 each 3  . HYDROcodone-homatropine (HYCODAN) 5-1.5 MG/5ML syrup Take 5 mLs by mouth every 6 (six) hours as needed for cough. 240 mL 0  . Lancets (ONETOUCH ULTRASOFT) lancets 1 each by Other route 2 (two) times daily. Use  to help check blood sugars twice a day 100 each 3  . LORazepam (ATIVAN) 0.5 MG tablet Take 1 tablet (0.5 mg total) by mouth every 6 (six) hours as needed (Nausea or vomiting). 30 tablet 0  . lovastatin (MEVACOR) 20 MG tablet TAKE 1 TABLET BY MOUTH EVERY OTHER DAY 90 tablet 1  . metFORMIN (GLUCOPHAGE) 500 MG tablet Take 1 tablet (500 mg total) by mouth 2 (two) times daily with a meal. 180 tablet 1  . methimazole (TAPAZOLE) 5 MG tablet Take 2.5 mg every  day 45 tablet 2  . Multiple Vitamins-Minerals (VISION-VITE  PRESERVE PO) Take 1 tablet by mouth daily.     . ondansetron (ZOFRAN) 8 MG tablet Take 1 tablet twice a day for 4 days starting day AFTER each chemotherapy 30 tablet 4  . Probiotic Product (ALIGN PO) Take by mouth daily.    . prochlorperazine (COMPAZINE) 10 MG tablet Take 1 tablet (10 mg total) by mouth every 6 (six) hours as needed (Nausea or vomiting). 30 tablet 1   No current facility-administered medications on file prior to visit.     Past Medical History:  Diagnosis Date  . Anxiety   . Back pain    bulding disc  . Bronchitis    hx of-many,many years   . Diabetes mellitus   . Diabetes mellitus    type 2 and takes Metformin bid  . Dyslipidemia   . Embolism - blood clot    hx of in left lower leg and was on Coumadin for about 49month;this was about 8-169yrago  . Family history of adverse reaction to anesthesia    Daughter- Nausea  . Goals of care, counseling/discussion 11/12/2017  . History of colonic polyps   . History of radiation therapy 01/06/16-02/19/16   The primary tumor and involved mediastinal adenopathy were treated to 66 Gy in 33 fractions of 2 Gy.  . Marland Kitchenistory of radiation therapy 04/01/17-04/26/17   cervical spine 35 Gy in 14 fractions  . HOH (hard of hearing)   . Hx of migraines    as a teenager  . Hypercholesterolemia   . Hyperlipidemia    takes Lovastatin every other day  . Hypertension    takes Ziac daily and Ramipril as well  . Hyperthyroidism   . Hypothyroidism    takes Synthroid every other day  . Insomnia    takes Elavil nightly  . Joint pain   . Lung mass   . Myalgia   . Osteoarthritis   . Osteoporosis   . Pneumothorax of right lung after biopsy 07/09/11   HAD CHEST TUBE PLACEMENT  . PONV (postoperative nausea and vomiting)   . [place]   pT2a, pN0 M0 Stage 1 Well differenced Adenocarcinoma 3.5 cm resected 07/17/2011  . [place] of lung [place]  . Shortness of breath  dyspnea    With exertion  . Skin cancer    legs have dark spots on them  . Upper respiratory infection 04/2011  . Urinary incontinence    takes Detrol daily prn    Past Surgical History:  Procedure Laterality Date  . ABDOMINAL HYSTERECTOMY    . ANTERIOR CERVICAL DECOMP/DISCECTOMY FUSION N/A 02/17/2017   Procedure: ANTERIOR CERVICAL DECOMPRESSION FUSION, CERVICAL 6-7 WITH INSTRUMENTATION AND ALLOGRAFT;  Surgeon: DuPhylliss BobMD;  [facility] Service: [facility];  Laterality: N/A;  ANTERIOR CERVICAL DECOMPRESSION FUSION, CERVICAL 6-7 WITH INSTRUMENTATION AND ALLOGRAFT; REQUEST 2 HOURS AND FLIP   .  APPENDECTOMY     as a child  . CHEST TUBE REMOVAL  07/13/11   RIGHT  CHEST TUBE  . CHOLECYSTECTOMY    . COLONOSCOPY    . COLONOSCOPY    . DILATION AND CURETTAGE OF UTERUS     x 2  . EYE SURGERY Bilateral    Cataract  . HEMORRHOID SURGERY    . IR THORACENTESIS ASP PLEURAL SPACE W/IMG GUIDE  02/03/2018  . LUNG BIOPSY  07/09/11   RIGHT UPER LOBE LUNG MASS   . LUNG LOBECTOMY Right 06/2011  . POSTERIOR CERVICAL FUSION/FORAMINOTOMY N/A 02/18/2017   Procedure: CERVICAL Five-Six, CERVICAL Six-Seven, CERVICAL Seven-THORACIC One, THORACIC One-Two POSTERIOR SPINAL FUSION WITH INSTRUMENTATION AND ALLOGRAFT;  Surgeon: Phylliss Bob, MD;  Location: [facility];  Service: Orthopedics;  Laterality: N/A;  . RIGHT CHEST TUBE PLACEMENT  07/10/11   S/P LUNG BX  . ROTATOR CUFF REPAIR     bilateral  . TOE SURGERY  2013   right small toe  . TONSILLECTOMY     as a child  . VIDEO BRONCHOSCOPY  07/17/2011   Procedure: VIDEO BRONCHOSCOPY;  Surgeon: Grace Isaac, MD;  Location: [facility];  Service: Thoracic;  Laterality: N/A;  . VIDEO BRONCHOSCOPY WITH ENDOBRONCHIAL ULTRASOUND N/A 12/10/2015   Procedure: VIDEO BRONCHOSCOPY WITH ENDOBRONCHIAL ULTRASOUND with biopsies.;  Surgeon: Grace Isaac, MD;  Location: [facility] OR;  Service: Thoracic;  Laterality: N/A;    Social History   Socioeconomic History  .  Marital status: Widowed    Spouse name: Not on file  . Number of children: 3  . Years of education: Not on file  . Highest education level: Not on file  Occupational History  . Not on file  Social Needs  . Financial resource strain: Not hard at all  . Food insecurity:    Worry: Never true    Inability: Never true  . Transportation needs:    Medical: No    Non-medical: No  Tobacco Use  . Smoking status: Never Smoker  . Smokeless tobacco: Never Used  Substance and Sexual Activity  . Alcohol use: No    Alcohol/week: 0.0 standard drinks    Comment: "rarely have a glass of wine"  . Drug use: No  . Sexual activity: Never    Birth control/protection: Surgical  Lifestyle  . Physical activity:    Days per week: 0 days    Minutes per session: 0 min  . Stress: Not at all  Relationships  . Social connections:    Talks on phone: More than three times a week    Gets together: More than three times a week    Attends religious service: More than 4 times per year    Active member of club or organization: Yes    Attends meetings of clubs or organizations: More than 4 times per year    Relationship status: Widowed  Other Topics Concern  . Not on file  Social History Narrative  . Not on file    Family History  Problem Relation Age of Onset  . Hypertension Mother   . Hypertension Father   . Arthritis Sister   . Arthritis Sister   . Diabetes Brother   . Hypertension Sister   . Anesthesia problems Neg Hx   . Hypotension Neg Hx   . Malignant hyperthermia Neg Hx   . Pseudochol deficiency Neg Hx   . Heart attack Neg Hx     Review of Systems  Constitutional: Positive for appetite change (  decreased) and fatigue. Negative for chills and fever.  Respiratory: Positive for cough and shortness of breath. Negative for wheezing.   Cardiovascular: Negative for chest pain, palpitations and leg swelling.  Neurological: Negative for light-headedness and headaches.       Objective:    Vitals:   04/01/18 1406  BP: 128/80  Pulse: 69  Resp: 17  Temp: 97.6 F (36.4 C)  SpO2: 98%   BP Readings from Last 3 Encounters:  04/01/18 128/80  04/01/18 128/80  03/31/18 (!) 166/89   Wt Readings from Last 3 Encounters:  04/01/18 117 lb (53.1 kg)  04/01/18 117 lb (53.1 kg)  03/31/18 116 lb (52.6 kg)   Body mass index is 22.11 kg/m.   Physical Exam    Constitutional: Appears well-developed and well-nourished. No distress.  HENT:  Head: Normocephalic and atraumatic.  Neck: Neck supple. No tracheal deviation present. No thyromegaly present.  No cervical lymphadenopathy Cardiovascular: Normal rate, regular rhythm and normal heart sounds.   No murmur heard. No carotid bruit .  No edema Pulmonary/Chest: Effort normal and breath sounds normal. No respiratory distress. No has no wheezes. No rales.  Skin: Skin is warm and dry. Not diaphoretic.  Psychiatric: Normal mood and affect. Behavior is normal.      Assessment & Plan:    See Problem List for Assessment and Plan of chronic medical problems.

## 2018-03-31 NOTE — Patient Instructions (Signed)
[facility] Discharge Instructions for Patients Receiving Chemotherapy  Today you received the following chemotherapy agents Avastin/Alimta To help prevent nausea and vomiting after your treatment, we encourage you to take your nausea medication as prescribed.   If you develop nausea and vomiting that is not controlled by your nausea medication, call the clinic.   BELOW ARE SYMPTOMS THAT SHOULD BE REPORTED IMMEDIATELY:  *FEVER GREATER THAN 100.5 F  *CHILLS WITH OR WITHOUT FEVER  NAUSEA AND VOMITING THAT IS NOT CONTROLLED WITH YOUR NAUSEA MEDICATION  *UNUSUAL SHORTNESS OF BREATH  *UNUSUAL BRUISING OR BLEEDING  TENDERNESS IN MOUTH AND THROAT WITH OR WITHOUT PRESENCE OF ULCERS  *URINARY PROBLEMS  *BOWEL PROBLEMS  UNUSUAL RASH Items with * indicate a potential emergency and should be followed up as soon as possible.  Feel free to call the clinic should you have any questions or concerns. The clinic phone number is [phone].  Please show the [facility] at check-in to the Emergency Department and triage nurse.

## 2018-03-31 NOTE — Progress Notes (Signed)
Hematology and Oncology Follow Up Visit  Samantha Quinn [id number] 1927/06/17 84 y.o. 03/31/2018   Principle Diagnosis:  Stage IV adenocarcinoma of the right lung-mutation negative C6-7 left nerve root compression secondary to metastatic disease - PD-L1 (0%) /TMB of 9  Past Therapy: Status post radiation/chemotherapy for recurrence-completed October 2017 Status post right upper lobectomy in March 2013 for stage IB disease XRT to the cervical spine - started 04/02/2017  Tecentriq 1200 mg IV every 3 weeks s/p cycle 8 -- d/c progression  Current Therapy:   Alimta/Avastin s/p cycle #4  Zometa 4 mg IV q66month -next dose on February 2020    Interim History:  Samantha Quinn here today for follow-up.  She is doing pretty well.  When she was here in October, she was having some problems with chest discomfort.  We did get a chest x-ray on her.  She had a pleural effusion on the right side.  We went ahead and got a thoracentesis on her.  This was done on February 03, 2018.  Thankfully, the pathology report (([id number] did not show any malignant cells.  Her breathing has been doing pretty well.  I will get another chest x-ray on her today.  She did have a nice Thanksgiving.  She was with her family.  She is not having any problems with pain.  Her neck is doing okay.  She is had no change in bowel or bladder habits.  She has had no fever.  There has been no bleeding.  Overall, her performance status is ECOG 2.     Medications:    Allergies:  Allergies  Allergen Reactions  . Ramipril Other (See Comments)    Tongue swelling  . Tramadol Other (See Comments)    syncope syncope  . Codeine Nausea And Vomiting  . Demerol Nausea Only    Past Medical History, Surgical history, Social history, and Family History were reviewed and updated.  Review of Systems: All other 10 point review of systems is negative.   Physical Exam:  weight is 116 lb (52.6 kg). Her oral temperature is 97.5  F (36.4 C) (abnormal). Her blood pressure is 166/89 (abnormal) and her pulse is 83. Her respiration is 17 and oxygen saturation is 100%.   Wt Readings from Last 3 Encounters:  03/31/18 116 lb (52.6 kg)  03/28/18 115 lb (52.2 kg)  01/31/18 118 lb 4 oz (53.6 kg)    Ocular: Sclerae unicteric, pupils equal, round and reactive to light Ear-nose-throat: Oropharynx clear, dentition fair Lymphatic: No cervical, supraclavicular or axillary adenopathy Lungs no rales or rhonchi, good excursion bilaterally Heart regular rate and rhythm, no murmur appreciated Abd soft, nontender, positive bowel sounds, no liver or spleen tip palpated on exam, no fluid wave  MSK no focal spinal tenderness, no joint edema Neuro: non-focal, well-oriented, appropriate affect Breasts: Deferred  Lab Results  Component Value Date   WBC 5.7 03/31/2018   HGB 11.3 (L) 03/31/2018   HCT 36.1 03/31/2018   MCV 95.3 03/31/2018   PLT 299 03/31/2018   Lab Results  Component Value Date   FERRITIN 514 (H) 04/07/2017   IRON 61 04/07/2017   TIBC 299 04/07/2017   UIBC 238 04/07/2017   IRONPCTSAT 20 (L) 04/07/2017   Lab Results  Component Value Date   RBC 3.79 (L) 03/31/2018   No results found for: KPAFRELGTCHN, LAMBDASER, KAPLAMBRATIO No results found for: IGGSERUM, IGA, IGMSERUM No results found for: TOTALPROTELP, ALBUMINELP, A1GS, A2GS, BETS, BETA2SER, GChandler MSPIKE, SPEI  Chemistry      Component Value Date/Time   NA 133 (L) 03/31/2018 1101   NA 144 04/07/2017 1215   NA 135 (L) 03/11/2017 1416   K 3.7 03/31/2018 1101   K 4.0 04/07/2017 1215   K 3.9 03/11/2017 1416   CL 96 (L) 03/31/2018 1101   CL 103 04/07/2017 1215   CO2 28 03/31/2018 1101   CO2 29 04/07/2017 1215   CO2 23 03/11/2017 1416   BUN 13 03/31/2018 1101   BUN 13 04/07/2017 1215   BUN 16.1 03/11/2017 1416   CREATININE 0.70 03/31/2018 1101   CREATININE 0.8 04/07/2017 1215   CREATININE 0.9 03/11/2017 1416      Component Value Date/Time    CALCIUM 9.2 03/31/2018 1101   CALCIUM 9.6 04/07/2017 1215   CALCIUM 9.6 03/11/2017 1416   ALKPHOS 84 03/31/2018 1101   ALKPHOS 60 04/07/2017 1215   ALKPHOS 79 03/11/2017 1416   AST 18 03/31/2018 1101   AST 11 03/11/2017 1416   ALT 11 03/31/2018 1101   ALT 15 04/07/2017 1215   ALT 11 03/11/2017 1416   BILITOT 0.4 03/31/2018 1101   BILITOT 0.26 03/11/2017 1416      Impression and Plan: Samantha Quinn is a very pleasant 84 yo caucasian female with progressive metastatic non small cell adenocarcinoma of the lung.   We will go ahead with her fifth cycle of treatment with Alimta/Avastin.  She will also get her Zometa.  She will be set up with a PET scan in early January.  This will really show [place] how things are going for her.  I will plan to get her back after the PET scan.     [name], MD 12/5/201911:40 AM

## 2018-04-01 ENCOUNTER — Ambulatory Visit (INDEPENDENT_AMBULATORY_CARE_PROVIDER_SITE_OTHER): Payer: Medicare Other | Admitting: *Deleted

## 2018-04-01 ENCOUNTER — Encounter: Payer: Self-pay | Admitting: Internal Medicine

## 2018-04-01 ENCOUNTER — Encounter: Payer: Self-pay | Admitting: *Deleted

## 2018-04-01 ENCOUNTER — Ambulatory Visit (INDEPENDENT_AMBULATORY_CARE_PROVIDER_SITE_OTHER): Payer: Medicare Other | Admitting: Internal Medicine

## 2018-04-01 VITALS — BP 128/80 | HR 69 | Temp 97.6°F | Resp 17 | Ht 61.0 in | Wt 117.0 lb

## 2018-04-01 DIAGNOSIS — C7951 Secondary malignant neoplasm of bone: Secondary | ICD-10-CM | POA: Diagnosis not present

## 2018-04-01 DIAGNOSIS — E7800 Pure hypercholesterolemia, unspecified: Secondary | ICD-10-CM

## 2018-04-01 DIAGNOSIS — C3490 Malignant neoplasm of unspecified part of unspecified bronchus or lung: Secondary | ICD-10-CM

## 2018-04-01 DIAGNOSIS — E0800 Diabetes mellitus due to underlying condition with hyperosmolarity without nonketotic hyperglycemic-hyperosmolar coma (NKHHC): Secondary | ICD-10-CM | POA: Diagnosis not present

## 2018-04-01 DIAGNOSIS — F419 Anxiety disorder, unspecified: Secondary | ICD-10-CM

## 2018-04-01 DIAGNOSIS — Z0000 Encounter for general adult medical examination without abnormal findings: Secondary | ICD-10-CM

## 2018-04-01 DIAGNOSIS — I10 Essential (primary) hypertension: Secondary | ICD-10-CM

## 2018-04-01 NOTE — Patient Instructions (Addendum)
Continue doing brain stimulating activities (puzzles, reading, adult coloring books, staying active) to keep memory sharp.   Continue to eat heart healthy diet (full of fruits, vegetables, whole grains, lean protein, water--limit salt, fat, and sugar intake) and increase physical activity as tolerated.   Samantha Quinn , Thank you for taking time to come for your Medicare Wellness Visit. I appreciate your ongoing commitment to your health goals. Please review the following plan we discussed and let me know if I can assist you in the future.   These are the goals we discussed: Goals    . Increase my activity level     I will work with physical therapy to get my back stronger and walk as much as I can.     . Patient Stated     Stay as independent as possible, love family and enjoy life.       This is a list of the screening recommended for you and due dates:  Health Maintenance  Topic Date Due  . Complete foot exam   01/15/1938  . Tetanus Vaccine  01/[birth year]/1949  . Hemoglobin A1C  04/14/2018  . Eye exam for diabetics  07/29/2018  . Flu Shot  Completed  . DEXA scan (bone density measurement)  Completed  . Pneumonia vaccines  Completed   Health Maintenance, Female Adopting a healthy lifestyle and getting preventive care can go a long way to promote health and wellness. Talk with your health care provider about what schedule of regular examinations is right for you. This is a good chance for you to check in with your provider about disease prevention and staying healthy. In between checkups, there are plenty of things you can do on your own. Experts have done a lot of research about which lifestyle changes and preventive measures are most likely to keep you healthy. Ask your health care provider for more information. Weight and diet Eat a healthy diet  Be sure to include plenty of vegetables, fruits, low-fat dairy products, and lean protein.  Do not eat a lot of foods high in solid fats,  added sugars, or salt.  Get regular exercise. This is one of the most important things you can do for your health. ? Most adults should exercise for at least 150 minutes each week. The exercise should increase your heart rate and make you sweat (moderate-intensity exercise). ? Most adults should also do strengthening exercises at least twice a week. This is in addition to the moderate-intensity exercise.  Maintain a healthy weight  Body mass index (BMI) is a measurement that can be used to identify possible weight problems. It estimates body fat based on height and weight. Your health care provider can help determine your BMI and help you achieve or maintain a healthy weight.  For females 21 years of age and older: ? A BMI below 18.5 is considered underweight. ? A BMI of 18.5 to 24.9 is normal. ? A BMI of 25 to 29.9 is considered overweight. ? A BMI of 30 and above is considered obese.  Watch levels of cholesterol and blood lipids  You should start having your blood tested for lipids and cholesterol at 82 years of age, then have this test every 5 years.  You may need to have your cholesterol levels checked more often if: ? Your lipid or cholesterol levels are high. ? You are older than 82 years of age. ? You are at high risk for heart disease.  Cancer screening Lung Cancer  Lung cancer screening is recommended for adults 56-25 years old who are at high risk for lung cancer because of a history of smoking.  A yearly low-dose CT scan of the lungs is recommended for people who: ? Currently smoke. ? Have quit within the past 15 years. ? Have at least a 30-pack-year history of smoking. A pack year is smoking an average of one pack of cigarettes a day for 1 year.  Yearly screening should continue until it has been 15 years since you quit.  Yearly screening should stop if you develop a health problem that would prevent you from having lung cancer treatment.  Breast Cancer  Practice  breast self-awareness. This means understanding how your breasts normally appear and feel.  It also means doing regular breast self-exams. Let your health care provider know about any changes, no matter how small.  If you are in your 20s or 30s, you should have a clinical breast exam (CBE) by a health care provider every 1-3 years as part of a regular health exam.  If you are 27 or older, have a CBE every year. Also consider having a breast X-ray (mammogram) every year.  If you have a family history of breast cancer, talk to your health care provider about genetic screening.  If you are at high risk for breast cancer, talk to your health care provider about having an MRI and a mammogram every year.  Breast cancer gene (BRCA) assessment is recommended for women who have family members with BRCA-related cancers. BRCA-related cancers include: ? Breast. ? Ovarian. ? Tubal. ? Peritoneal cancers.  Results of the assessment will determine the need for genetic counseling and BRCA1 and BRCA2 testing.  Cervical Cancer Your health care provider may recommend that you be screened regularly for cancer of the pelvic organs (ovaries, uterus, and vagina). This screening involves a pelvic examination, including checking for microscopic changes to the surface of your cervix (Pap test). You may be encouraged to have this screening done every 3 years, beginning at age 57.  For women ages 41-65, health care providers may recommend pelvic exams and Pap testing every 3 years, or they may recommend the Pap and pelvic exam, combined with testing for human papilloma virus (HPV), every 5 years. Some types of HPV increase your risk of cervical cancer. Testing for HPV may also be done on women of any age with unclear Pap test results.  Other health care providers may not recommend any screening for nonpregnant women who are considered low risk for pelvic cancer and who do not have symptoms. Ask your health care provider  if a screening pelvic exam is right for you.  If you have had past treatment for cervical cancer or a condition that could lead to cancer, you need Pap tests and screening for cancer for at least 20 years after your treatment. If Pap tests have been discontinued, your risk factors (such as having a new sexual partner) need to be reassessed to determine if screening should resume. Some women have medical problems that increase the chance of getting cervical cancer. In these cases, your health care provider may recommend more frequent screening and Pap tests.  Colorectal Cancer  This type of cancer can be detected and often prevented.  Routine colorectal cancer screening usually begins at 82 years of age and continues through 82 years of age.  Your health care provider may recommend screening at an earlier age if you have risk factors for colon cancer.  Your  health care provider may also recommend using home test kits to check for hidden blood in the stool.  A small camera at the end of a tube can be used to examine your colon directly (sigmoidoscopy or colonoscopy). This is done to check for the earliest forms of colorectal cancer.  Routine screening usually begins at age 9.  Direct examination of the colon should be repeated every 5-10 years through 82 years of age. However, you may need to be screened more often if early forms of precancerous polyps or small growths are found.  Skin Cancer  Check your skin from head to toe regularly.  Tell your health care provider about any new moles or changes in moles, especially if there is a change in a mole's shape or color.  Also tell your health care provider if you have a mole that is larger than the size of a pencil eraser.  Always use sunscreen. Apply sunscreen liberally and repeatedly throughout the day.  Protect yourself by wearing long sleeves, pants, a wide-brimmed hat, and sunglasses whenever you are outside.  Heart disease,  diabetes, and high blood pressure  High blood pressure causes heart disease and increases the risk of stroke. High blood pressure is more likely to develop in: ? People who have blood pressure in the high end of the normal range (130-139/85-89 mm Hg). ? People who are overweight or obese. ? People who are African American.  If you are 21-45 years of age, have your blood pressure checked every 3-5 years. If you are 77 years of age or older, have your blood pressure checked every year. You should have your blood pressure measured twice-once when you are at a hospital or clinic, and once when you are not at a hospital or clinic. Record the average of the two measurements. To check your blood pressure when you are not at a hospital or clinic, you can use: ? An automated blood pressure machine at a pharmacy. ? A home blood pressure monitor.  If you are between 73 years and 81 years old, ask your health care provider if you should take aspirin to prevent strokes.  Have regular diabetes screenings. This involves taking a blood sample to check your fasting blood sugar level. ? If you are at a normal weight and have a low risk for diabetes, have this test once every three years after 82 years of age. ? If you are overweight and have a high risk for diabetes, consider being tested at a younger age or more often. Preventing infection Hepatitis B  If you have a higher risk for hepatitis B, you should be screened for this virus. You are considered at high risk for hepatitis B if: ? You were born in a country where hepatitis B is common. Ask your health care provider which countries are considered high risk. ? Your parents were born in a high-risk country, and you have not been immunized against hepatitis B (hepatitis B vaccine). ? You have HIV or AIDS. ? You use needles to inject street drugs. ? You live with someone who has hepatitis B. ? You have had sex with someone who has hepatitis B. ? You get  hemodialysis treatment. ? You take certain medicines for conditions, including cancer, organ transplantation, and autoimmune conditions.  Hepatitis C  Blood testing is recommended for: ? Everyone born from [birth year] through 1963. ? Anyone with known risk factors for hepatitis C.  Sexually transmitted infections (STIs)  You should be screened for  sexually transmitted infections (STIs) including gonorrhea and chlamydia if: ? You are sexually active and are younger than 82 years of age. ? You are older than 82 years of age and your health care provider tells you that you are at risk for this type of infection. ? Your sexual activity has changed since you were last screened and you are at an increased risk for chlamydia or gonorrhea. Ask your health care provider if you are at risk.  If you do not have HIV, but are at risk, it may be recommended that you take a prescription medicine daily to prevent HIV infection. This is called pre-exposure prophylaxis (PrEP). You are considered at risk if: ? You are sexually active and do not regularly use condoms or know the HIV status of your partner(s). ? You take drugs by injection. ? You are sexually active with a partner who has HIV.  Talk with your health care provider about whether you are at high risk of being infected with HIV. If you choose to begin PrEP, you should first be tested for HIV. You should then be tested every 3 months for as long as you are taking PrEP. Pregnancy  If you are premenopausal and you may become pregnant, ask your health care provider about preconception counseling.  If you may become pregnant, take 400 to 800 micrograms (mcg) of folic acid every day.  If you want to prevent pregnancy, talk to your health care provider about birth control (contraception). Osteoporosis and menopause  Osteoporosis is a disease in which the bones lose minerals and strength with aging. This can result in serious bone fractures. Your risk for  osteoporosis can be identified using a bone density scan.  If you are 43 years of age or older, or if you are at risk for osteoporosis and fractures, ask your health care provider if you should be screened.  Ask your health care provider whether you should take a calcium or vitamin D supplement to lower your risk for osteoporosis.  Menopause may have certain physical symptoms and risks.  Hormone replacement therapy may reduce some of these symptoms and risks. Talk to your health care provider about whether hormone replacement therapy is right for you. Follow these instructions at home:  Schedule regular health, dental, and eye exams.  Stay current with your immunizations.  Do not use any tobacco products including cigarettes, chewing tobacco, or electronic cigarettes.  If you are pregnant, do not drink alcohol.  If you are breastfeeding, limit how much and how often you drink alcohol.  Limit alcohol intake to no more than 1 drink per day for nonpregnant women. One drink equals 12 ounces of beer, 5 ounces of Viola Kinnick, or 1 ounces of hard liquor.  Do not use street drugs.  Do not share needles.  Ask your health care provider for help if you need support or information about quitting drugs.  Tell your health care provider if you often feel depressed.  Tell your health care provider if you have ever been abused or do not feel safe at home. This information is not intended to replace advice given to you by your health care provider. Make sure you discuss any questions you have with your health care provider. Document Released: 10/27/2010 Document Revised: 09/19/2015 Document Reviewed: 01/15/2015 Elsevier Interactive Patient Education  [employer].

## 2018-04-01 NOTE — Assessment & Plan Note (Signed)
Was on ramipril but stopped due to tongue swelling Amlodipine 2.5 mg started - BP well controlled here today Continue ziac daily She will monitor her BP at home

## 2018-04-01 NOTE — Assessment & Plan Note (Signed)
Continue statin. 

## 2018-04-01 NOTE — Assessment & Plan Note (Signed)
management per oncology Had chemo yesterday PET next month

## 2018-04-01 NOTE — Patient Instructions (Addendum)
  Medications reviewed and updated.  Changes include :   none    Please followup in 6 months   

## 2018-04-01 NOTE — Assessment & Plan Note (Signed)
Lab Results  Component Value Date   HGBA1C 6.0 (H) 10/13/2017    Has been well controlled with metformin Continue metformin

## 2018-04-01 NOTE — Progress Notes (Addendum)
Subjective:   Samantha Quinn is a 84 y.o. female who presents for Medicare Annual (Subsequent) preventive examination.  Review of Systems:  No ROS.  Medicare Wellness Visit. Additional risk factors are reflected in the social history.  Cardiac Risk Factors include: advanced age (>56mn, >>43women);diabetes mellitus;dyslipidemia;hypertension Sleep patterns: gets up 1 times nightly to void and sleeps 7-8 hours nightly.    Home Safety/Smoke Alarms: Feels safe in home. Smoke alarms in place.  Living environment; residence and Firearm Safety: apartment, no firearms. Lives alone, no needs for DME, good support system Seat Belt Safety/Bike Helmet: Wears seat belt.     Objective:     Vitals: BP 128/80   Pulse 69   Temp 97.6 F (36.4 C)   Resp 17   Ht '5\' 1"'  (1.549 m)   Wt 117 lb (53.1 kg)   SpO2 98%   BMI 22.11 kg/m   Body mass index is 22.11 kg/m.  Advanced Directives 04/01/2018 03/31/2018 03/28/2018 03/10/2018 01/31/2018 01/20/2018 01/17/2018  Does Patient Have a Medical Advance Directive? Yes Yes Yes Yes Yes Yes Yes  Type of AParamedicof A[place]wolfLiving will [place]New Carroll[place] will Living will Living will - Living will Living will  Does patient want to make changes to medical advance directive? - - - No - Patient declined No - Patient declined - -  Copy of H[place]in Chart? No - copy requested No - copy requested - No - copy requested - - No - copy requested  Pre-existing out of facility DNR order (yellow form or pink MOST form) - - - - - - -    Tobacco Social History   Tobacco Use  Smoking Status Never Smoker  Smokeless Tobacco Never Used     Counseling given: Not Answered  Past Medical History:  Diagnosis Date  . Anxiety   . Back pain    bulding disc  . Bronchitis    hx of-many,many years   . Diabetes mellitus   . Diabetes mellitus    type 2 and takes Metformin bid  . Dyslipidemia   . Embolism - blood  clot    hx of in left lower leg and was on Coumadin for about 661monththis was about [id number]  . Family history of adverse reaction to anesthesia    Daughter- Nausea  . Goals of care, counseling/discussion 11/12/2017  . History of colonic polyps   . History of radiation therapy 01/06/16-02/19/16   The primary tumor and involved mediastinal adenopathy were treated to 66 Gy in 33 fractions of 2 Gy.  . HMarland Kitchenstory of radiation therapy 04/01/17-04/26/17   cervical spine 35 Gy in 14 fractions  . HOH (hard of hearing)   . Hx of migraines    as a teenager  . Hypercholesterolemia   . Hyperlipidemia    takes Lovastatin every other day  . Hypertension    takes Ziac daily and Ramipril as well  . Hyperthyroidism   . Hypothyroidism    takes Synthroid every other day  . Insomnia    takes Elavil nightly  . Joint pain   . Lung mass   . Myalgia   . Osteoarthritis   . Osteoporosis   . Pneumothorax of right lung after biopsy 07/09/11   HAD CHEST TUBE PLACEMENT  . PONV (postoperative nausea and vomiting)   . Primary adenocarcinoma of lower lobe of right lung ([facility]/27/2013   pT2a, pN0 M0 Stage 1 Well differenced Adenocarcinoma 3.5  cm resected 07/17/2011  . Primary adenocarcinoma of lung ([place]) 06/24/2011  . Shortness of breath dyspnea    With exertion  . Skin cancer    legs have dark spots on them  . Upper respiratory infection 04/2011  . Urinary incontinence    takes Detrol daily prn   Past Surgical History:  Procedure Laterality Date  . ABDOMINAL HYSTERECTOMY    . ANTERIOR CERVICAL DECOMP/DISCECTOMY FUSION N/A 02/17/2017   Procedure: ANTERIOR CERVICAL DECOMPRESSION FUSION, CERVICAL 6-7 WITH INSTRUMENTATION AND ALLOGRAFT;  Surgeon: Phylliss Bob, MD;  Location: [facility];  Service: Orthopedics;  Laterality: N/A;  ANTERIOR CERVICAL DECOMPRESSION FUSION, CERVICAL 6-7 WITH INSTRUMENTATION AND ALLOGRAFT; REQUEST 2 HOURS AND FLIP   . APPENDECTOMY     as a child  . CHEST TUBE REMOVAL  07/13/11   RIGHT   CHEST TUBE  . CHOLECYSTECTOMY    . COLONOSCOPY    . COLONOSCOPY    . DILATION AND CURETTAGE OF UTERUS     x 2  . EYE SURGERY Bilateral    Cataract  . HEMORRHOID SURGERY    . IR THORACENTESIS ASP PLEURAL SPACE W/IMG GUIDE  02/03/2018  . LUNG BIOPSY  07/09/11   RIGHT UPER LOBE LUNG MASS   . LUNG LOBECTOMY Right 06/2011  . POSTERIOR CERVICAL FUSION/FORAMINOTOMY N/A 02/18/2017   Procedure: CERVICAL Five-Six, CERVICAL Six-Seven, CERVICAL Seven-THORACIC One, THORACIC One-Two POSTERIOR SPINAL FUSION WITH INSTRUMENTATION AND ALLOGRAFT;  Surgeon: Phylliss Bob, MD;  Location: [facility];  Service: Orthopedics;  Laterality: N/A;  . RIGHT CHEST TUBE PLACEMENT  07/10/11   S/P LUNG BX  . ROTATOR CUFF REPAIR     bilateral  . TOE SURGERY  2013   right small toe  . TONSILLECTOMY     as a child  . VIDEO BRONCHOSCOPY  07/17/2011   Procedure: VIDEO BRONCHOSCOPY;  Surgeon: Grace Isaac, MD;  Location: [facility];  Service: Thoracic;  Laterality: N/A;  . VIDEO BRONCHOSCOPY WITH ENDOBRONCHIAL ULTRASOUND N/A 12/10/2015   Procedure: VIDEO BRONCHOSCOPY WITH ENDOBRONCHIAL ULTRASOUND with biopsies.;  Surgeon: Grace Isaac, MD;  Location[facility];  Service: Thoracic;  Laterality: N/A;   Family History  Problem Relation Age of Onset  . Hypertension Mother   . Hypertension Father   . Arthritis Sister   . Arthritis Sister   . Diabetes Brother   . Hypertension Sister   . Anesthesia problems Neg Hx   . Hypotension Neg Hx   . Malignant hyperthermia Neg Hx   . Pseudochol deficiency Neg Hx   . Heart attack Neg Hx    Social History   Socioeconomic History  . Marital status: Widowed    Spouse name: Not on file  . Number of children: 3  . Years of education: Not on file  . Highest education level: Not on file  Occupational History  . Not on file  Social Needs  . Financial resource strain: Not hard at all  . Food insecurity:    Worry: Never true    Inability: Never true  . Transportation needs:     Medical: No    Non-medical: No  Tobacco Use  . Smoking status: Never Smoker  . Smokeless tobacco: Never Used  Substance and Sexual Activity  . Alcohol use: No    Alcohol/week: 0.0 standard drinks    Comment: "rarely have a glass of Jaymeson Mengel"  . Drug use: No  . Sexual activity: Never    Birth control/protection: Surgical  Lifestyle  . Physical activity:  Days per week: 0 days    Minutes per session: 0 min  . Stress: Not at all  Relationships  . Social connections:    Talks on phone: More than three times a week    Gets together: More than three times a week    Attends religious service: More than 4 times per year    Active member of club or organization: Yes    Attends meetings of clubs or organizations: More than 4 times per year    Relationship status: Widowed  Other Topics Concern  . Not on file  Social History Narrative  . Not on file    Outpatient Encounter Medications as of 04/01/2018  Medication Sig  . amitriptyline (ELAVIL) 10 MG tablet TAKE 1 TABLET BY MOUTH EVERYDAY AT BEDTIME  . amLODipine (NORVASC) 2.5 MG tablet Take 1 tablet (2.5 mg total) by mouth daily.  . bisoprolol-hydrochlorothiazide (ZIAC) 2.5-6.25 MG tablet TAKE 1 TABLET BY MOUTH EVERY DAY  . Blood Glucose Monitoring Suppl (ONE TOUCH ULTRA 2) w/Device KIT Use to check blood sugars twice a day Dx E11.9  . calcium carbonate (OS-CAL) 600 MG tablet Take 600 mg by mouth daily.  . cholecalciferol (VITAMIN D) 1000 units tablet Take 2,000 Units by mouth daily.   [name] dexamethasone (DECADRON) 4 MG tablet Take 1 tab two times a day for 4 days starting day AFTER each chemotherapy.  . folic acid (FOLVITE) 1 MG tablet Take 1 tablet (1 mg total) by mouth daily. Start 5-7 days before Alimta chemotherapy. Continue until 21 days after Alimta completed.  . gabapentin (NEURONTIN) 100 MG capsule Take 100 mg by mouth 3 (three) times daily.   [name] glucose blood (ONE TOUCH ULTRA TEST) test strip 1 each by Other route 2 (two) times  daily. Use to check blood sugars twice a day  . HYDROcodone-homatropine (HYCODAN) 5-1.5 MG/5ML syrup Take 5 mLs by mouth every 6 (six) hours as needed for cough.  . Lancets (ONETOUCH ULTRASOFT) lancets 1 each by Other route 2 (two) times daily. Use to help check blood sugars twice a day  . LORazepam (ATIVAN) 0.5 MG tablet Take 1 tablet (0.5 mg total) by mouth every 6 (six) hours as needed (Nausea or vomiting).  [name] lovastatin (MEVACOR) 20 MG tablet TAKE 1 TABLET BY MOUTH EVERY OTHER DAY  . metFORMIN (GLUCOPHAGE) 500 MG tablet Take 1 tablet (500 mg total) by mouth 2 (two) times daily with a meal.  . methimazole (TAPAZOLE) 5 MG tablet Take 2.5 mg every  day  . Multiple Vitamins-Minerals (VISION-VITE PRESERVE PO) Take 1 tablet by mouth daily.   . ondansetron (ZOFRAN) 8 MG tablet Take 1 tablet twice a day for 4 days starting day AFTER each chemotherapy  . Probiotic Product (ALIGN PO) Take by mouth daily.  . prochlorperazine (COMPAZINE) 10 MG tablet Take 1 tablet (10 mg total) by mouth every 6 (six) hours as needed (Nausea or vomiting).  . [DISCONTINUED] methimazole (TAPAZOLE) 5 MG tablet ALTERNATE TAKING 1 TABLET AND 1/2 TABLET EACH DAY (Patient not taking: Reported on 04/01/2018)   No facility-administered encounter medications on file as of 04/01/2018.     Activities of Daily Living In your present state of health, do you have any difficulty performing the following activities: 04/01/2018  Hearing? N  Vision? N  Difficulty concentrating or making decisions? N  Walking or climbing stairs? N  Dressing or bathing? N  Doing errands, shopping? N  Preparing Food and eating ? N  Using the Toilet? N  In the past six months, have you accidently leaked urine? N  Do you have problems with loss of bowel control? N  Managing your Medications? N  Managing your Finances? N  Housekeeping or managing your Housekeeping? N  Some recent data might be hidden    Patient Care Team: Binnie Rail, MD as PCP -  General (Internal Medicine) Grace Isaac, MD (Cardiothoracic Surgery)    Assessment:   This is a routine wellness examination for Samantha Quinn. Physical assessment deferred to PCP.   Exercise Activities and Dietary recommendations Current Exercise Habits: The patient does not participate in regular exercise at present, Exercise limited by: orthopedic condition(s)  Diet (meal preparation, eat out, water intake, caffeinated beverages, dairy products, fruits and vegetables): in general, a "healthy" diet  , well balanced. Poor appetite at times due to chemotherapy.   Discussed supplementing with Ensure or Boost and to eat high protein foods frequently. Encouraged patient to increase daily water and healthy fluid intake.  Goals    . Increase my activity level     I will work with physical therapy to get my back stronger and walk as much as I can.     . Patient Stated     Stay as independent as possible, love family and enjoy life.       Fall Risk Fall Risk  04/01/2018 11/16/2017 06/03/2017 03/11/2017 12/23/2016  Falls in the past year? 0 No No Yes Yes  Comment - - - - -  Number falls in past yr: - - - 1 1  Comment - - - - -  Injury with Fall? - - - Yes Yes  Risk Factor Category  - - - - High Fall Risk  Risk for fall due to : Impaired balance/gait;Impaired mobility - - History of fall(s) -  Follow up Falls prevention discussed - - Falls evaluation completed -   Depression Screen PHQ 2/9 Scores 04/01/2018 11/16/2017 03/11/2017 11/18/2016  PHQ - 2 Score 0 0 1 2  PHQ- 9 Score 3 - - 3     Cognitive Function MMSE - Mini Mental State Exam 04/01/2018 11/18/2016  Orientation to time 5 5  Orientation to Place 5 5  Registration 3 3  Attention/ Calculation 5 5  Recall 1 2  Language- name 2 objects 2 2  Language- repeat 1 1  Language- follow 3 step command 3 3  Language- read & follow direction 1 1  Write a sentence 1 1  Copy design 1 1  Total score 28 29        Immunization History    Administered Date(s) Administered  . Influenza Nasal 12/10/2015  . Influenza,inj,Quad PF,6+ Mos 01/20/2017, 01/20/2018  . Influenza-Unspecified 12/26/2012, 01/24/2014, 12/27/2014  . Pneumococcal Conjugate-13 02/26/2015  . Pneumococcal Polysaccharide-23 07/27/2011   Screening Tests Health Maintenance  Topic Date Due  . FOOT EXAM  01[birth year]/1940  . TETANUS/TDAP  01/14/1949  . HEMOGLOBIN A1C  04/14/2018  . OPHTHALMOLOGY EXAM  07/29/2018  . INFLUENZA VACCINE  Completed  . DEXA SCAN  Completed  . PNA vac Low Risk Adult  Completed      Plan:   Continue doing brain stimulating activities (puzzles, reading, adult coloring books, staying active) to keep memory sharp.   Continue to eat heart healthy diet (full of fruits, vegetables, whole grains, lean protein, water--limit salt, fat, and sugar intake) and increase physical activity as tolerated.  I have personally reviewed and noted the following in the patient's chart:   . Medical  and social history . Use of alcohol, tobacco or illicit drugs  . Current medications and supplements . Functional ability and status . Nutritional status . Physical activity . Advanced directives . List of other physicians . Vitals . Screenings to include cognitive, depression, and falls . Referrals and appointments  In addition, I have reviewed and discussed with patient certain preventive protocols, quality metrics, and best practice recommendations. A written personalized care plan for preventive services as well as general preventive health recommendations were provided to patient.     Michiel Cowboy, RN  04/01/2018    Medical screening examination/treatment/procedure(s) were performed by non-physician practitioner and as supervising physician I was immediately available for consultation/collaboration. I agree with above. Binnie Rail, MD

## 2018-04-05 ENCOUNTER — Ambulatory Visit: Payer: Self-pay

## 2018-04-05 NOTE — Telephone Encounter (Signed)
Increase amlodipine to 5 mg daily - she can either take 2.5 mg twice a day or take 5 mg at once.  She should call with an update in a few days.

## 2018-04-05 NOTE — Telephone Encounter (Signed)
Please advise on any changes needed

## 2018-04-05 NOTE — Telephone Encounter (Signed)
Pt. Reports her BP this morning at 0730 160/90. She ate breakfast and took her medications. At 1230 BP was 170/85. No symptoms. Pt. States she thinks her medications "need to be increased." Please advise pt.  Answer Assessment - Initial Assessment Questions 1. BLOOD PRESSURE: "What is the blood pressure?" "Did you take at least two measurements 5 minutes apart?"     0730  160/90     1230  170/85 2. ONSET: "When did you take your blood pressure?"     This morning 3. HOW: "How did you obtain the blood pressure?" (e.g., visiting nurse, automatic home BP monitor)     Home BP monitor 4. HISTORY: "Do you have a history of high blood pressure?"     Yes  5. MEDICATIONS: "Are you taking any medications for blood pressure?" "Have you missed any doses recently?"     No missed doses 6. OTHER SYMPTOMS: "Do you have any symptoms?" (e.g., headache, chest pain, blurred vision, difficulty breathing, weakness)     No 7. PREGNANCY: "Is there any chance you are pregnant?" "When was your last menstrual period?"     No  Protocols used: HIGH BLOOD PRESSURE-A-AH

## 2018-04-06 NOTE — Telephone Encounter (Signed)
Pt aware of response. Will call back in a few days with readings.

## 2018-04-13 ENCOUNTER — Encounter: Payer: Self-pay | Admitting: Family

## 2018-04-13 ENCOUNTER — Ambulatory Visit (HOSPITAL_BASED_OUTPATIENT_CLINIC_OR_DEPARTMENT_OTHER)
Admission: RE | Admit: 2018-04-13 | Discharge: 2018-04-13 | Disposition: A | Discharge status: Home / Self Care | Payer: Medicare Other | Source: Ambulatory Visit | Attending: Family | Admitting: Family

## 2018-04-13 ENCOUNTER — Inpatient Hospital Stay: Payer: Medicare Other

## 2018-04-13 ENCOUNTER — Other Ambulatory Visit: Payer: Self-pay | Admitting: Family

## 2018-04-13 ENCOUNTER — Other Ambulatory Visit: Payer: Self-pay

## 2018-04-13 ENCOUNTER — Inpatient Hospital Stay (HOSPITAL_BASED_OUTPATIENT_CLINIC_OR_DEPARTMENT_OTHER): Payer: Medicare Other | Admitting: Family

## 2018-04-13 VITALS — BP 126/71 | HR 76 | Temp 98.1°F | Resp 20 | Wt 116.8 lb

## 2018-04-13 DIAGNOSIS — Z923 Personal history of irradiation: Secondary | ICD-10-CM | POA: Diagnosis not present

## 2018-04-13 DIAGNOSIS — E119 Type 2 diabetes mellitus without complications: Secondary | ICD-10-CM

## 2018-04-13 DIAGNOSIS — Z7984 Long term (current) use of oral hypoglycemic drugs: Secondary | ICD-10-CM

## 2018-04-13 DIAGNOSIS — C3490 Malignant neoplasm of unspecified part of unspecified bronchus or lung: Secondary | ICD-10-CM | POA: Diagnosis not present

## 2018-04-13 DIAGNOSIS — C7951 Secondary malignant neoplasm of bone: Secondary | ICD-10-CM | POA: Diagnosis not present

## 2018-04-13 DIAGNOSIS — R0602 Shortness of breath: Secondary | ICD-10-CM | POA: Insufficient documentation

## 2018-04-13 DIAGNOSIS — Z79899 Other long term (current) drug therapy: Secondary | ICD-10-CM

## 2018-04-13 DIAGNOSIS — C3431 Malignant neoplasm of lower lobe, right bronchus or lung: Secondary | ICD-10-CM

## 2018-04-13 DIAGNOSIS — R5383 Other fatigue: Secondary | ICD-10-CM | POA: Diagnosis not present

## 2018-04-13 DIAGNOSIS — D500 Iron deficiency anemia secondary to blood loss (chronic): Secondary | ICD-10-CM

## 2018-04-13 DIAGNOSIS — D508 Other iron deficiency anemias: Secondary | ICD-10-CM

## 2018-04-13 LAB — CBC WITH DIFFERENTIAL (CANCER CENTER ONLY)
Abs Immature Granulocytes: 0.12 10*3/uL — ABNORMAL HIGH (ref 0.00–0.07)
BASOS ABS: 0 10*3/uL (ref 0.0–0.1)
Basophils Relative: 0 %
EOS ABS: 0 10*3/uL (ref 0.0–0.5)
Eosinophils Relative: 1 %
HEMATOCRIT: 34.5 % — AB (ref 36.0–46.0)
Hemoglobin: 10.7 g/dL — ABNORMAL LOW (ref 12.0–15.0)
Immature Granulocytes: 2 %
Lymphocytes Relative: 6 %
Lymphs Abs: 0.4 10*3/uL — ABNORMAL LOW (ref 0.7–4.0)
MCH: 29.2 pg (ref 26.0–34.0)
MCHC: 31 g/dL (ref 30.0–36.0)
MCV: 94 fL (ref 80.0–100.0)
Monocytes Absolute: 0.9 10*3/uL (ref 0.1–1.0)
Monocytes Relative: 13 %
Neutro Abs: 5.1 10*3/uL (ref 1.7–7.7)
Neutrophils Relative %: 78 %
Platelet Count: 178 10*3/uL (ref 150–400)
RBC: 3.67 MIL/uL — ABNORMAL LOW (ref 3.87–5.11)
RDW: 15.9 % — ABNORMAL HIGH (ref 11.5–15.5)
WBC Count: 6.5 10*3/uL (ref 4.0–10.5)
nRBC: 0 % (ref 0.0–0.2)

## 2018-04-13 LAB — CMP (CANCER CENTER ONLY)
ALT: 13 U/L (ref 0–44)
AST: 19 U/L (ref 15–41)
Albumin: 4 g/dL (ref 3.5–5.0)
Alkaline Phosphatase: 78 U/L (ref 38–126)
Anion gap: 10 (ref 5–15)
BILIRUBIN TOTAL: 0.3 mg/dL (ref 0.3–1.2)
BUN: 17 mg/dL (ref 8–23)
CO2: 28 mmol/L (ref 22–32)
Calcium: 9.6 mg/dL (ref 8.9–10.3)
Chloride: 99 mmol/L (ref 98–111)
Creatinine: 0.68 mg/dL (ref 0.44–1.00)
GFR, Est AFR Am: >60 mL/min (ref >60)
GFR, Estimated: >60 mL/min (ref >60)
Glucose, Bld: 114 mg/dL — ABNORMAL HIGH (ref 70–99)
POTASSIUM: 4.1 mmol/L (ref 3.5–5.1)
Sodium: 137 mmol/L (ref 135–145)
Total Protein: 6.9 g/dL (ref 6.5–8.1)

## 2018-04-13 LAB — RETICULOCYTES
Immature Retic Fract: 22.8 % — ABNORMAL HIGH (ref 2.3–15.9)
RBC.: 3.66 MIL/uL — ABNORMAL LOW (ref 3.87–5.11)
RETIC COUNT ABSOLUTE: 71.7 10*3/uL (ref 19.0–186.0)
RETIC CT PCT: 2 % (ref 0.4–3.1)

## 2018-04-13 NOTE — Progress Notes (Signed)
Hematology and Oncology Follow Up Visit  Samantha Quinn [id number] 1928-02-15 84 y.o. 04/13/2018   Principle Diagnosis:  Stage IV adenocarcinoma of the right lung-mutation negative C6-7 left nerve root compression secondary to metastatic disease - PD-L1 (0%) /TMB of 9  Past Therapy: Status post radiation/chemotherapy for recurrence-completed October 2017 Status post right upper lobectomy in March 2013 for stage IB disease XRT to the cervical spine - started 04/02/2017 Tecentriq 1200 mg IV every 3 weeks s/p cycle 8 -- d/c progression  Current Therapy:   Alimta/Avastin s/p cycle 5  Zometa 4 mg IV q41month - next dose on February 2020   Interim History:  Samantha Quinn here today with c/o worsening SOB over the last several days. She is feeling fatigued.  Chest xray today showed stable densities at the right lung base compatible with right pleural fluid. No significant change from the recent comparison examinations. Stable post treatment changes in the right hemithorax. Her Hgb is down slightly at 10.7. Iron studies are pending.  She has occasional small amounts of blood on her tissue when blowing her nose. No other episodes of bleeding, no bruising or petechiae.   No fever, chills, n/v, cough, rash, dizziness, chest pain, palpitations, abdominal pain or changes in bowel or bladder habits.  No swelling, tenderness, numbness or tingling in her extremities.  No lymphadenopathy noted on exam.  She has a good appetite and is staying well hydrated. Her weight is stable.   ECOG Performance Status: 1 - Symptomatic but completely ambulatory  Medications:  Allergies as of 04/13/2018      Reactions   Ramipril Other (See Comments)   Tongue swelling   Tramadol Other (See Comments)   syncope syncope   Codeine Nausea And Vomiting   Demerol Nausea Only      Medication List       Accurate as of April 13, 2018  1:30 PM. Always use your most recent med list.        ALIGN PO Take  by mouth daily.   amitriptyline 10 MG tablet Commonly known as:  ELAVIL TAKE 1 TABLET BY MOUTH EVERYDAY AT BEDTIME   amLODipine 2.5 MG tablet Commonly known as:  NORVASC Take 1 tablet (2.5 mg total) by mouth daily.   bisoprolol-hydrochlorothiazide 2.5-6.25 MG tablet Commonly known as:  ZIAC TAKE 1 TABLET BY MOUTH EVERY DAY   calcium carbonate 600 MG tablet Commonly known as:  OS-CAL Take 600 mg by mouth daily.   cholecalciferol 1000 units tablet Commonly known as:  VITAMIN D Take 2,000 Units by mouth daily.   dexamethasone 4 MG tablet Commonly known as:  DECADRON Take 1 tab two times a day for 4 days starting day AFTER each chemotherapy.   folic acid 1 MG tablet Commonly known as:  FOLVITE Take 1 tablet (1 mg total) by mouth daily. Start 5-7 days before Alimta chemotherapy. Continue until 21 days after Alimta completed.   gabapentin 100 MG capsule Commonly known as:  NEURONTIN Take 100 mg by mouth 3 (three) times daily.   glucose blood test strip Commonly known as:  ONE TOUCH ULTRA TEST 1 each by Other route 2 (two) times daily. Use to check blood sugars twice a day   HYDROcodone-homatropine 5-1.5 MG/5ML syrup Commonly known as:  HYCODAN Take 5 mLs by mouth every 6 (six) hours as needed for cough.   LORazepam 0.5 MG tablet Commonly known as:  ATIVAN Take 1 tablet (0.5 mg total) by mouth every 6 (six) hours as  needed (Nausea or vomiting).   lovastatin 20 MG tablet Commonly known as:  MEVACOR TAKE 1 TABLET BY MOUTH EVERY OTHER DAY   metFORMIN 500 MG tablet Commonly known as:  GLUCOPHAGE Take 1 tablet (500 mg total) by mouth 2 (two) times daily with a meal.   methimazole 5 MG tablet Commonly known as:  TAPAZOLE Take 2.5 mg every  day   ondansetron 8 MG tablet Commonly known as:  ZOFRAN Take 1 tablet twice a day for 4 days starting day AFTER each chemotherapy   ONE TOUCH ULTRA 2 w/Device Kit Use to check blood sugars twice a day Dx E11.9   onetouch  ultrasoft lancets 1 each by Other route 2 (two) times daily. Use to help check blood sugars twice a day   prochlorperazine 10 MG tablet Commonly known as:  COMPAZINE Take 1 tablet (10 mg total) by mouth every 6 (six) hours as needed (Nausea or vomiting).   VISION-VITE PRESERVE PO Take 1 tablet by mouth daily.       Allergies:  Allergies  Allergen Reactions  . Ramipril Other (See Comments)    Tongue swelling  . Tramadol Other (See Comments)    syncope syncope  . Codeine Nausea And Vomiting  . Demerol Nausea Only    Past Medical History, Surgical history, Social history, and Family History were reviewed and updated.  Review of Systems: All other 10 point review of systems is negative.   Physical Exam:  vitals were not taken for this visit.   Wt Readings from Last 3 Encounters:  04/01/18 117 lb (53.1 kg)  04/01/18 117 lb (53.1 kg)  03/31/18 116 lb (52.6 kg)    Ocular: Sclerae unicteric, pupils equal, round and reactive to light Ear-nose-throat: Oropharynx clear, dentition fair Lymphatic: No cervical, supraclavicular or axillary adenopathy Lungs no rales or rhonchi, good excursion bilaterally Heart regular rate and rhythm, no murmur appreciated Abd soft, nontender, positive bowel sounds, no liver or spleen tip palpated on exam, no fluid wave  MSK no focal spinal tenderness, no joint edema Neuro: non-focal, well-oriented, appropriate affect Breasts: Deferred   Lab Results  Component Value Date   WBC 5.7 03/31/2018   HGB 11.3 (L) 03/31/2018   HCT 36.1 03/31/2018   MCV 95.3 03/31/2018   PLT 299 03/31/2018   Lab Results  Component Value Date   FERRITIN 514 (H) 04/07/2017   IRON 61 04/07/2017   TIBC 299 04/07/2017   UIBC 238 04/07/2017   IRONPCTSAT 20 (L) 04/07/2017   Lab Results  Component Value Date   RBC 3.79 (L) 03/31/2018   No results found for: KPAFRELGTCHN, LAMBDASER, KAPLAMBRATIO No results found for: IGGSERUM, IGA, IGMSERUM No results found for:  Kathrynn Ducking, MSPIKE, SPEI   Chemistry      Component Value Date/Time   NA 133 (L) 03/31/2018 1101   NA 144 04/07/2017 1215   NA 135 (L) 03/11/2017 1416   K 3.7 03/31/2018 1101   K 4.0 04/07/2017 1215   K 3.9 03/11/2017 1416   CL 96 (L) 03/31/2018 1101   CL 103 04/07/2017 1215   CO2 28 03/31/2018 1101   CO2 29 04/07/2017 1215   CO2 23 03/11/2017 1416   BUN 13 03/31/2018 1101   BUN 13 04/07/2017 1215   BUN 16.1 03/11/2017 1416   CREATININE 0.70 03/31/2018 1101   CREATININE 0.8 04/07/2017 1215   CREATININE 0.9 03/11/2017 1416      Component Value Date/Time   CALCIUM 9.2  03/31/2018 1101   CALCIUM 9.6 04/07/2017 1215   CALCIUM 9.6 03/11/2017 1416   ALKPHOS 84 03/31/2018 1101   ALKPHOS 60 04/07/2017 1215   ALKPHOS 79 03/11/2017 1416   AST 18 03/31/2018 1101   AST 11 03/11/2017 1416   ALT 11 03/31/2018 1101   ALT 15 04/07/2017 1215   ALT 11 03/11/2017 1416   BILITOT 0.4 03/31/2018 1101   BILITOT 0.26 03/11/2017 1416       Impression and Plan: Samantha Quinn is a very pleasant 84 yo caucasian female with progressive metastatic non small cell adenocarcinoma of the lung. She is here today with c/o fatigue and worsening SOB over the last several days.  Dr. Marin Olp was able to view her chest xray report and images and results were stable.  We will see what her iron studies show and bring her back in for infusion if needed.  We will plan to see her back in January after she has her PET scan.  Shew ill contact our office with any questions or concerns. We can certainly see her sooner if need be.   Laverna Peace, NP 12/18/20191:30 PM

## 2018-04-14 ENCOUNTER — Telehealth: Payer: Self-pay | Admitting: Family

## 2018-04-14 ENCOUNTER — Other Ambulatory Visit: Payer: Self-pay | Admitting: Internal Medicine

## 2018-04-14 ENCOUNTER — Other Ambulatory Visit: Payer: Self-pay | Admitting: Family

## 2018-04-14 LAB — IRON AND TIBC
Iron: 53 ug/dL (ref 41–142)
SATURATION RATIOS: 18 % — AB (ref 21–57)
TIBC: 298 ug/dL (ref 236–444)
UIBC: 244 ug/dL (ref 120–384)

## 2018-04-14 LAB — FERRITIN: Ferritin: 784 ng/mL — ABNORMAL HIGH (ref 11–307)

## 2018-04-14 MED ORDER — AMLODIPINE BESYLATE 5 MG PO TABS: 5.0000 mg | ORAL_TABLET | Freq: Every day | ORAL | 0 refills | Status: DC

## 2018-04-14 NOTE — Telephone Encounter (Signed)
Copied from [place] [id number]. Topic: Quick Communication - Rx Refill/Question >> Apr 14, 2018 11:30 AM Oneta Rack wrote:  Relation to pt: self  Call back number: [phone] Pharmacy: [id number] - [place], [place] [phone] (Phone) [phone] (Fax)   Reason for call:  Patient was last seen 04/01/18 and states PCP prescribed amLODipine (NORVASC) 2.5 MG tablet 2x daily (chart doesn't reflect 2x daily) patient requesting a refill. Patient states new medication is working fine and unsure if PCP will increase to 5MG  1x daily, please advise

## 2018-04-14 NOTE — Telephone Encounter (Signed)
Updated rx sent to pharmacy

## 2018-04-14 NOTE — Telephone Encounter (Signed)
Reason for call:  Patient was last seen 04/01/18 and states PCP prescribed amLODipine (NORVASC) 2.5 MG tablet 2x daily (chart doesn't reflect 2x daily) patient requesting a refill. Patient states new medication is working fine and unsure if PCP will increase to 5MG  1x daily, please advise

## 2018-04-14 NOTE — Telephone Encounter (Signed)
sw pt to confirm iron infusion appt  12/20 at 1030 am per sch msg

## 2018-04-15 ENCOUNTER — Inpatient Hospital Stay: Payer: Medicare Other

## 2018-04-15 ENCOUNTER — Other Ambulatory Visit: Payer: Self-pay

## 2018-04-15 VITALS — BP 126/70 | HR 70 | Temp 97.5°F | Resp 18

## 2018-04-15 DIAGNOSIS — R0602 Shortness of breath: Secondary | ICD-10-CM | POA: Diagnosis not present

## 2018-04-15 DIAGNOSIS — D500 Iron deficiency anemia secondary to blood loss (chronic): Secondary | ICD-10-CM | POA: Diagnosis not present

## 2018-04-15 DIAGNOSIS — C3431 Malignant neoplasm of lower lobe, right bronchus or lung: Secondary | ICD-10-CM

## 2018-04-15 DIAGNOSIS — D508 Other iron deficiency anemias: Secondary | ICD-10-CM

## 2018-04-15 DIAGNOSIS — C7951 Secondary malignant neoplasm of bone: Secondary | ICD-10-CM | POA: Diagnosis not present

## 2018-04-15 DIAGNOSIS — E119 Type 2 diabetes mellitus without complications: Secondary | ICD-10-CM | POA: Diagnosis not present

## 2018-04-15 DIAGNOSIS — R5383 Other fatigue: Secondary | ICD-10-CM | POA: Diagnosis not present

## 2018-04-15 MED ORDER — SODIUM CHLORIDE 0.9 % IV SOLN
Freq: Once | INTRAVENOUS | Status: AC
  Administered 2018-04-15: 11:00:00 via INTRAVENOUS
  Filled 2018-04-15: qty 250

## 2018-04-15 MED ORDER — SODIUM CHLORIDE 0.9 % IV SOLN
510.0000 mg | Freq: Once | INTRAVENOUS | Status: AC
  Administered 2018-04-15: 510 mg via INTRAVENOUS
  Filled 2018-04-15: qty 17

## 2018-04-15 NOTE — Patient Instructions (Signed)

## 2018-04-18 ENCOUNTER — Telehealth: Payer: Self-pay | Admitting: Hematology & Oncology

## 2018-04-18 NOTE — Telephone Encounter (Signed)
Spoke with patient regarding PET scan appointment and she was given instructions NPO after midnight/ Hold AM does of diabetic meds/ arrival time of 8:30 at [facility]

## 2018-04-28 ENCOUNTER — Ambulatory Visit (HOSPITAL_COMMUNITY)
Admission: RE | Admit: 2018-04-28 | Discharge: 2018-04-28 | Disposition: A | Discharge status: Home / Self Care | Payer: Medicare Other | Source: Ambulatory Visit | Attending: Hematology & Oncology | Admitting: Hematology & Oncology

## 2018-04-28 DIAGNOSIS — C7951 Secondary malignant neoplasm of bone: Secondary | ICD-10-CM | POA: Insufficient documentation

## 2018-04-28 DIAGNOSIS — C3490 Malignant neoplasm of unspecified part of unspecified bronchus or lung: Secondary | ICD-10-CM

## 2018-04-28 LAB — GLUCOSE, CAPILLARY: Glucose-Capillary: 126 mg/dL — ABNORMAL HIGH (ref 70–99)

## 2018-04-28 MED ORDER — FLUDEOXYGLUCOSE F - 18 (FDG) INJECTION
5.8000 | Freq: Once | INTRAVENOUS | Status: AC | PRN
  Administered 2018-04-28: 5.8 via INTRAVENOUS

## 2018-04-29 ENCOUNTER — Ambulatory Visit (INDEPENDENT_AMBULATORY_CARE_PROVIDER_SITE_OTHER): Payer: Medicare Other | Admitting: Internal Medicine

## 2018-04-29 ENCOUNTER — Other Ambulatory Visit: Payer: Self-pay | Admitting: Internal Medicine

## 2018-04-29 VITALS — BP 120/70 | HR 77 | Ht 61.0 in | Wt 116.0 lb

## 2018-04-29 DIAGNOSIS — E0520 Thyrotoxicosis with toxic multinodular goiter without thyrotoxic crisis or storm: Secondary | ICD-10-CM

## 2018-04-29 DIAGNOSIS — E119 Type 2 diabetes mellitus without complications: Secondary | ICD-10-CM

## 2018-04-29 LAB — POCT GLYCOSYLATED HEMOGLOBIN (HGB A1C): Hemoglobin A1C: 5.6 % (ref 4.0–5.6)

## 2018-04-29 LAB — TSH: TSH: 3.97 u[IU]/mL (ref 0.35–4.50)

## 2018-04-29 LAB — T3, FREE: T3, Free: 3.2 pg/mL (ref 2.3–4.2)

## 2018-04-29 LAB — T4, FREE: Free T4: 0.8 ng/dL (ref 0.60–1.60)

## 2018-04-29 NOTE — Progress Notes (Signed)
Patient ID: Samantha Quinn, female   DOB: 10/15/1927, 85 y.o.   MRN: [id number]   HPI  VIELKA KLINEDINST is a 85 y.o.-year-old female, returning for follow-up for thyrotoxic goiter. Last visit was 1 year ago.  Patient has a history of lung cancer and had radiation therapy and immunotherapy.  She also has vertebral metastases in the posterior cervical region for which she had surgery and vertebral fusion and then had another session of radiation therapy. She just finished ChTx. New PET scan: stable lesions, no new metastases.  Reviewed history: She also has a history of thyroiditis with subsequent hypothyroidism and was on levothyroxine for many years. However, the TSH study to be suppressed and her levothyroxine dose continued to be decreased until this was stopped.  We started methimazole low-dose until she finished chemotherapy and had another restaging scan.  At last visit she was on 5 mg every other day.  However, the TSH was lower and I suggested to alternate 5 with 2.5 mg of methimazole every other day.  The TSH was slightly elevated at last check 6 months ago.  She is currently on 2.5 mg daily.  Review TFTs: Lab Results  Component Value Date   TSH 5.27 (H) 10/27/2017   TSH 0.40 04/29/2017   TSH 0.60 01/18/2017   TSH 3.65 11/18/2016   TSH 0.56 09/16/2016   TSH 0.12 (L) 08/12/2016   TSH 0.02 (L) 06/17/2016   TSH 0.02 (L) 05/05/2016   TSH <0.080 (L) 01/27/2016   TSH 0.01 (L) 11/12/2015   FREET4 0.66 10/27/2017   FREET4 0.69 04/29/2017   FREET4 0.81 01/18/2017   FREET4 0.53 (L) 11/18/2016   FREET4 0.55 (L) 09/16/2016   FREET4 0.46 (L) 08/12/2016   FREET4 0.81 06/17/2016   FREET4 1.09 05/05/2016   FREET4 1.32 11/12/2015   FREET4 1.47 07/10/2015    Graves' antibodies were not elevated: Lab Results  Component Value Date   TSI <89 06/17/2016   She has a history of thyroid nodules, with a thyroid ultrasound in 08/29/2013 and the subsequent biopsy of the dominant right mid nodule  measuring 3.1 x 1.9 x 2.1 cm. This was benign.  Thyroid uptake and scan 08/28/2016: uptake was elevated, at 40%, and the scan was positive for multinodular goiter.  Pt denies: - feeling nodules in neck - hoarseness - choking - SOB with lying down  But she does have dysphagia and a dry cough after her radiation therapy.  Pt does have a FH of thyroid ds.: mother - thyroid removed; sister - ?. No h/o radiation tx to head or neck. Had RxTx  - lung, cervical spine. No FH of thyroid cancer. No h/o radiation tx to head or neck.  No seaweed or kelp. No recent contrast studies. No herbal supplements. No Biotin use. No recent steroids use.   Her ramipril was stopped because of tongue swelling.  ROS: Constitutional: no weight gain/no weight loss, no fatigue, no subjective hyperthermia, no subjective hypothermia Eyes: no blurry vision, no xerophthalmia ENT: no sore throat, + see HPI Cardiovascular: no CP/no SOB/no palpitations/no leg swelling Respiratory: no cough/no SOB/no wheezing Gastrointestinal: no N/no V/no D/no C/no acid reflux Musculoskeletal: no muscle aches/no joint aches Skin: no rashes, no hair loss Neurological: no tremors/no numbness/no tingling/no dizziness  I reviewed pt's medications, allergies, PMH, social hx, family hx, and changes were documented in the history of present illness. Otherwise, unchanged from my initial visit note.  Past Medical History:  Diagnosis Date  . Anxiety   .  Back pain    bulding disc  . Bronchitis    hx of-many,many years   . Diabetes mellitus   . Diabetes mellitus    type 2 and takes Metformin bid  . Dyslipidemia   . Embolism - blood clot    hx of in left lower leg and was on Coumadin for about 41month;this was about 8-160yrago  . Family history of adverse reaction to anesthesia    Daughter- Nausea  . Goals of care, counseling/discussion 11/12/2017  . History of colonic polyps   . History of radiation therapy 01/06/16-02/19/16   The  primary tumor and involved mediastinal adenopathy were treated to 66 Gy in 33 fractions of 2 Gy.  . Marland Kitchenistory of radiation therapy 04/01/17-04/26/17   cervical spine 35 Gy in 14 fractions  . HOH (hard of hearing)   . Hx of migraines    as a teenager  . Hypercholesterolemia   . Hyperlipidemia    takes Lovastatin every other day  . Hypertension    takes Ziac daily and Ramipril as well  . Hyperthyroidism   . Hypothyroidism    takes Synthroid every other day  . Insomnia    takes Elavil nightly  . Joint pain   . Lung mass   . Myalgia   . Osteoarthritis   . Osteoporosis   . Pneumothorax of right lung after biopsy 07/09/11   HAD CHEST TUBE PLACEMENT  . PONV (postoperative nausea and vomiting)   . [place]   pT2a, pN0 M0 Stage 1 Well differenced Adenocarcinoma 3.5 cm resected 07/17/2011  . [place] [place]  . Shortness of breath dyspnea    With exertion  . Skin cancer    legs have dark spots on them  . Upper respiratory infection 04/2011  . Urinary incontinence    takes Detrol daily prn   Past Surgical History:  Procedure Laterality Date  . ABDOMINAL HYSTERECTOMY    . ANTERIOR CERVICAL DECOMP/DISCECTOMY FUSION N/A 02/17/2017   Procedure: ANTERIOR CERVICAL DECOMPRESSION FUSION, CERVICAL 6-7 WITH INSTRUMENTATION AND ALLOGRAFT;  Surgeon: DuPhylliss BobMD;  [place] [facility];  Laterality: N/A;  ANTERIOR CERVICAL DECOMPRESSION FUSION, CERVICAL 6-7 WITH INSTRUMENTATION AND ALLOGRAFT; REQUEST 2 HOURS AND FLIP   . APPENDECTOMY     as a child  . CHEST TUBE REMOVAL  07/13/11   RIGHT  CHEST TUBE  . CHOLECYSTECTOMY    . COLONOSCOPY    . COLONOSCOPY    . DILATION AND CURETTAGE OF UTERUS     x 2  . EYE SURGERY Bilateral    Cataract  . HEMORRHOID SURGERY    . IR THORACENTESIS ASP PLEURAL SPACE W/IMG GUIDE  02/03/2018  . LUNG BIOPSY  07/09/11   RIGHT UPER LOBE LUNG MASS   . LUNG  LOBECTOMY Right 06/2011  . POSTERIOR CERVICAL FUSION/FORAMINOTOMY N/A 02/18/2017   Procedure: CERVICAL Five-Six, CERVICAL Six-Seven, CERVICAL Seven-THORACIC One, THORACIC One-Two POSTERIOR SPINAL FUSION WITH INSTRUMENTATION AND ALLOGRAFT;  Surgeon: DuPhylliss BobMD;  [place] [facility]: [facility];  Laterality: N/A;  . RIGHT CHEST TUBE PLACEMENT  07/10/11   S/P LUNG BX  . ROTATOR CUFF REPAIR     bilateral  . TOE SURGERY  2013   right small toe  . TONSILLECTOMY     as a child  . VIDEO BRONCHOSCOPY  07/17/2011   Procedure: VIDEO BRONCHOSCOPY;  Surgeon: EdGrace IsaacMD;  [facility] [facility]  Service: Thoracic;  Laterality: N/A;  . VIDEO BRONCHOSCOPY WITH ENDOBRONCHIAL ULTRASOUND N/A 12/10/2015   Procedure: VIDEO BRONCHOSCOPY WITH ENDOBRONCHIAL ULTRASOUND with biopsies.;  Surgeon: Grace Isaac, MD;  Location: [place];  Service: Thoracic;  Laterality: N/A;   Social History   Social History  . Marital status: Widowed    Spouse name: N/A  . Number of children: 3   Occupational History  . homemaker   Social History Main Topics  . Smoking status: Never Smoker  . Smokeless tobacco: Never Used  . Alcohol use No  . Drug use: No   Current Outpatient Medications on File Prior to Visit  Medication Sig Dispense Refill  . amitriptyline (ELAVIL) 10 MG tablet TAKE 1 TABLET BY MOUTH EVERYDAY AT BEDTIME 90 tablet 1  . amLODipine (NORVASC) 5 MG tablet Take 1 tablet (5 mg total) by mouth daily. 90 tablet 0  . bisoprolol-hydrochlorothiazide (ZIAC) 2.5-6.25 MG tablet TAKE 1 TABLET BY MOUTH EVERY DAY 90 tablet 0  . Blood Glucose Monitoring Suppl (ONE TOUCH ULTRA 2) w/Device KIT Use to check blood sugars twice a day Dx E11.9 1 each 0  . calcium carbonate (OS-CAL) 600 MG tablet Take 600 mg by mouth daily.    . cholecalciferol (VITAMIN D) 1000 units tablet Take 2,000 Units by mouth daily.     [name] dexamethasone (DECADRON) 4 MG tablet Take 1 tab two times a day for 4 days starting day AFTER each  chemotherapy. 40 tablet 4  . folic acid (FOLVITE) 1 MG tablet Take 1 tablet (1 mg total) by mouth daily. Start 5-7 days before Alimta chemotherapy. Continue until 21 days after Alimta completed. 100 tablet 3  . gabapentin (NEURONTIN) 100 MG capsule Take 100 mg by mouth daily.     [name] glucose blood (ONE TOUCH ULTRA TEST) test strip 1 each by Other route 2 (two) times daily. Use to check blood sugars twice a day 100 each 3  . HYDROcodone-homatropine (HYCODAN) 5-1.5 MG/5ML syrup Take 5 mLs by mouth every 6 (six) hours as needed for cough. 240 mL 0  . Lancets (ONETOUCH ULTRASOFT) lancets 1 each by Other route 2 (two) times daily. Use to help check blood sugars twice a day 100 each 3  . LORazepam (ATIVAN) 0.5 MG tablet Take 1 tablet (0.5 mg total) by mouth every 6 (six) hours as needed (Nausea or vomiting). (Patient not taking: Reported on 04/15/2018) 30 tablet 0  . lovastatin (MEVACOR) 20 MG tablet TAKE 1 TABLET BY MOUTH EVERY OTHER DAY 90 tablet 1  . metFORMIN (GLUCOPHAGE) 500 MG tablet Take 1 tablet (500 mg total) by mouth 2 (two) times daily with a meal. 180 tablet 1  . methimazole (TAPAZOLE) 5 MG tablet Take 2.5 mg every  day 45 tablet 2  . Multiple Vitamins-Minerals (VISION-VITE PRESERVE PO) Take 1 tablet by mouth daily.     . ondansetron (ZOFRAN) 8 MG tablet Take 1 tablet twice a day for 4 days starting day AFTER each chemotherapy (Patient not taking: Reported on 04/13/2018) 30 tablet 4  . Probiotic Product (ALIGN PO) Take by mouth daily.    . prochlorperazine (COMPAZINE) 10 MG tablet Take 1 tablet (10 mg total) by mouth every 6 (six) hours as needed (Nausea or vomiting). (Patient not taking: Reported on 04/13/2018) 30 tablet 1   No current facility-administered medications on file prior to visit.    Allergies  Allergen Reactions  . Ramipril Other (See Comments)    Tongue swelling  . Tramadol Other (See Comments)  syncope syncope  . Codeine Nausea And Vomiting  . Demerol Nausea Only    Family History  Problem Relation Age of Onset  . Hypertension Mother   . Hypertension Father   . Arthritis Sister   . Arthritis Sister   . Diabetes Brother   . Hypertension Sister   . Anesthesia problems Neg Hx   . Hypotension Neg Hx   . Malignant hyperthermia Neg Hx   . Pseudochol deficiency Neg Hx   . Heart attack Neg Hx    PE: BP 120/70   Pulse 77   Ht _0  (1.549 m) Comment: measured  Wt 116 lb (52.6 kg)   SpO2 92%   BMI 21.92 kg/m  Wt Readings from Last 3 Encounters:  05/06/18 117 lb 8 oz (53.3 kg)  04/29/18 116 lb (52.6 kg)  04/13/18 116 lb 12.8 oz (53 kg)   Constitutional: Normal weight, in NAD Eyes: PERRLA, EOMI, no exophthalmos ENT: moist mucous membranes, no thyromegaly but + R large mobile thyroid nodule, no cervical lymphadenopathy Cardiovascular: RRR, No MRG Respiratory: CTA B Gastrointestinal: abdomen soft, NT, ND, BS+ Musculoskeletal: no deformities, strength intact in all 4 Skin: moist, warm, no rashes Neurological: +tremor with outstretched hands, DTR normal in all 4   ASSESSMENT: 1. Thyrotoxicosis - h/o hypothyroidism  2. TMNG  PLAN:  1. And 2.  Patient with long history of low TSH, previously on levothyroxine, no methimazole for toxic multinodular goiter.  Her TSI antibodies were not elevated to suggest Graves' disease and she also had a thyroid uptake and scan indicating the above diagnosis.  We discussed at previous visit about the possibility of RAI treatment, however, she wanted to wait until she finished her cancer treatments before proceeding with this.  As of now, she finished immunotherapy, 2 rounds of radiotherapy and was on chemotherapy until ~1 mo ago.  Her cancer lesions appear stable. - She is on the low dose of methimazole, 2.5 mg daily. We discussed that I do not see that RAI tx is absolutely needed for now, as she is requiring such a low MMI dose. She agrees Orthoptist. - She is on bisoprolol-no palpitations. - We will  recheck her TFTs today - RTC in 1 year, but in 6 months for labs.  Needs a refill.  - time spent with the patient: 15 min, of which >50% was spent in obtaining information about her symptoms, reviewing her previous labs, evaluations, and treatments, counseling her about her condition (please see the discussed topics above), and developing a plan to further investigate and treat it; she had a number of questions which I addressed.  Addendum: A HbA1c was checked by mistake for her today, but it was very reassuring, at 5.6%. She does have a h/o well controlled DM, last HbA1c 6 mo ago: 6%.  Office Visit on 04/29/2018  Component Date Value Ref Range Status  . TSH 04/29/2018 3.97  0.35 - 4.50 uIU/mL Final  . Free T4 04/29/2018 0.80  0.60 - 1.60 ng/dL Final   Comment: Specimens from patients who are undergoing biotin therapy and /or ingesting biotin supplements may contain high levels of biotin.  The higher biotin concentration in these specimens interferes with this Free T4 assay.  Specimens that contain high levels  of biotin may cause false high results for this Free T4 assay.  Please interpret results in light of the total clinical presentation of the patient.    . T3, Free 04/29/2018 3.2  2.3 - 4.2 pg/mL Final  .  Hemoglobin A1C 04/29/2018 5.6  4.0 - 5.6 % Final  Hospital Outpatient Visit on 04/28/2018  Component Date Value Ref Range Status  . Glucose-Capillary 04/28/2018 126* 70 - 99 mg/dL Final  TFTs normal.  Will repeat her TSH in 3 rather than 6 months as her TSH is close to the upper limit of normal.  Philemon Kingdom, MD PhD [facility] Endocrinology

## 2018-04-29 NOTE — Patient Instructions (Addendum)
Please stop at the lab.  Continue Methimazole 2.5 mg every day.  Come back for labs in 6 months, but for a visit in 1 year.

## 2018-05-04 DIAGNOSIS — L821 Other seborrheic keratosis: Secondary | ICD-10-CM | POA: Diagnosis not present

## 2018-05-04 DIAGNOSIS — D485 Neoplasm of uncertain behavior of skin: Secondary | ICD-10-CM | POA: Diagnosis not present

## 2018-05-04 DIAGNOSIS — Z85820 Personal history of malignant melanoma of skin: Secondary | ICD-10-CM | POA: Diagnosis not present

## 2018-05-04 DIAGNOSIS — Z85828 Personal history of other malignant neoplasm of skin: Secondary | ICD-10-CM | POA: Diagnosis not present

## 2018-05-04 DIAGNOSIS — L570 Actinic keratosis: Secondary | ICD-10-CM | POA: Diagnosis not present

## 2018-05-04 DIAGNOSIS — D044 Carcinoma in situ of skin of scalp and neck: Secondary | ICD-10-CM | POA: Diagnosis not present

## 2018-05-04 DIAGNOSIS — D2261 Melanocytic nevi of right upper limb, including shoulder: Secondary | ICD-10-CM | POA: Diagnosis not present

## 2018-05-06 ENCOUNTER — Inpatient Hospital Stay: Payer: Medicare Other

## 2018-05-06 ENCOUNTER — Other Ambulatory Visit: Payer: Self-pay

## 2018-05-06 ENCOUNTER — Encounter: Payer: Self-pay | Admitting: Hematology & Oncology

## 2018-05-06 ENCOUNTER — Inpatient Hospital Stay: Payer: Medicare Other | Attending: Hematology & Oncology | Admitting: Hematology & Oncology

## 2018-05-06 VITALS — BP 126/61 | HR 77 | Temp 97.7°F | Resp 18 | Wt 117.5 lb

## 2018-05-06 DIAGNOSIS — C3490 Malignant neoplasm of unspecified part of unspecified bronchus or lung: Secondary | ICD-10-CM

## 2018-05-06 DIAGNOSIS — J90 Pleural effusion, not elsewhere classified: Secondary | ICD-10-CM | POA: Insufficient documentation

## 2018-05-06 DIAGNOSIS — Z923 Personal history of irradiation: Secondary | ICD-10-CM | POA: Insufficient documentation

## 2018-05-06 DIAGNOSIS — C7951 Secondary malignant neoplasm of bone: Secondary | ICD-10-CM | POA: Insufficient documentation

## 2018-05-06 DIAGNOSIS — D500 Iron deficiency anemia secondary to blood loss (chronic): Secondary | ICD-10-CM

## 2018-05-06 DIAGNOSIS — C3431 Malignant neoplasm of lower lobe, right bronchus or lung: Secondary | ICD-10-CM | POA: Diagnosis not present

## 2018-05-06 DIAGNOSIS — D508 Other iron deficiency anemias: Secondary | ICD-10-CM

## 2018-05-06 LAB — CBC WITH DIFFERENTIAL (CANCER CENTER ONLY)
Abs Immature Granulocytes: 0.06 10*3/uL (ref 0.00–0.07)
Basophils Absolute: 0 10*3/uL (ref 0.0–0.1)
Basophils Relative: 0 %
Eosinophils Absolute: 0.1 10*3/uL (ref 0.0–0.5)
Eosinophils Relative: 1 %
HCT: 37.4 % (ref 36.0–46.0)
Hemoglobin: 11.7 g/dL — ABNORMAL LOW (ref 12.0–15.0)
Immature Granulocytes: 1 %
Lymphocytes Relative: 6 %
Lymphs Abs: 0.5 10*3/uL — ABNORMAL LOW (ref 0.7–4.0)
MCH: 29.8 pg (ref 26.0–34.0)
MCHC: 31.3 g/dL (ref 30.0–36.0)
MCV: 95.2 fL (ref 80.0–100.0)
MONO ABS: 0.7 10*3/uL (ref 0.1–1.0)
Monocytes Relative: 10 %
Neutro Abs: 5.9 10*3/uL (ref 1.7–7.7)
Neutrophils Relative %: 82 %
Platelet Count: 241 10*3/uL (ref 150–400)
RBC: 3.93 MIL/uL (ref 3.87–5.11)
RDW: 15.9 % — ABNORMAL HIGH (ref 11.5–15.5)
WBC Count: 7.3 10*3/uL (ref 4.0–10.5)
nRBC: 0 % (ref 0.0–0.2)

## 2018-05-06 LAB — CMP (CANCER CENTER ONLY)
ALT: 8 U/L (ref 0–44)
AST: 17 U/L (ref 15–41)
Albumin: 4.2 g/dL (ref 3.5–5.0)
Alkaline Phosphatase: 79 U/L (ref 38–126)
Anion gap: 12 (ref 5–15)
BUN: 18 mg/dL (ref 8–23)
CHLORIDE: 100 mmol/L (ref 98–111)
CO2: 28 mmol/L (ref 22–32)
Calcium: 9.6 mg/dL (ref 8.9–10.3)
Creatinine: 0.78 mg/dL (ref 0.44–1.00)
GFR, Est AFR Am: >60 mL/min (ref >60)
GFR, Estimated: >60 mL/min (ref >60)
Glucose, Bld: 121 mg/dL — ABNORMAL HIGH (ref 70–99)
Potassium: 4 mmol/L (ref 3.5–5.1)
Sodium: 140 mmol/L (ref 135–145)
Total Bilirubin: 0.3 mg/dL (ref 0.3–1.2)
Total Protein: 7.3 g/dL (ref 6.5–8.1)

## 2018-05-06 LAB — RETICULOCYTES
Immature Retic Fract: 11.8 % (ref 2.3–15.9)
RBC.: 3.93 MIL/uL (ref 3.87–5.11)
Retic Count, Absolute: 80.6 10*3/uL (ref 19.0–186.0)
Retic Ct Pct: 2.1 % (ref 0.4–3.1)

## 2018-05-06 NOTE — Progress Notes (Signed)
Hematology and Oncology Follow Up Visit  Samantha Quinn [id number] [birth year] 85 y.o. 05/06/2018   Principle Diagnosis:  Stage IV adenocarcinoma of the right lung-mutation negative C6-7 left nerve root compression secondary to metastatic disease - PD-L1 (0%) /TMB of 9  Past Therapy: Status post radiation/chemotherapy for recurrence-completed October 2017 Status post right upper lobectomy in March 2013 for stage IB disease XRT to the cervical spine - started 04/02/2017  Tecentriq 1200 mg IV every 3 weeks s/p cycle 8 -- d/c progression  Current Therapy:   Alimta/Avastin s/p cycle #5 -- d/c on 05/06/2018 Zometa 4 mg IV q3 weeks -next dose on February 2020    Interim History:  Samantha Quinn is here today for follow-up.  She comes in with 1 of her daughters.  We did go ahead and do a follow-up PET scan on her.  Essentially, the PET scan, which was done on 04/28/2018, showed stable disease.  She still really only has bony disease.  She has some areas that are improved.  She has some areas that might be more metabolic.  There really is nothing new on her scan.  She has this complex right pleural effusion which is not hypermetabolic.  I really think that we need to hold on further therapy for her.  She is 85 years old.  I do not think that we really need to be treating her and affecting negatively her quality of life.  She does not have much of an appetite.  Her daughter is worried that she is not eating much and that she will continue to have problems with eating.  I think it would be very reasonable just to follow her along and just have her on Zometa.  I think this would be a decent way of trying to help her right now.  We have done quite well with her.  I am just not sure that continuing her on maintenance therapy is going to improve her quality of life.  She actually is going to [place] tomorrow for her sisters 90th birthday.  She is worried about having her teeth cleaned.  I told her that  having teeth cleaned should not be a problem with her on Zometa.  As long as she has no teeth pulled she will be okay.  She has had no rashes.  There is been no leg swelling.  She has had no bleeding.  There is been no change in bowel or bladder habits.  There is no cough or shortness of breath.    Overall, her performance status is ECOG 2.    Medications:    Allergies:  Allergies  Allergen Reactions  . Ramipril Other (See Comments)    Tongue swelling  . Tramadol Other (See Comments)    syncope syncope  . Codeine Nausea And Vomiting  . Demerol Nausea Only    Past Medical History, Surgical history, Social history, and Family History were reviewed and updated.  Review of Systems: Review of Systems  Constitutional: Negative.   HENT: Negative.   Eyes: Negative.   Respiratory: Negative.   Cardiovascular: Negative.   Gastrointestinal: Negative.   Genitourinary: Negative.   Musculoskeletal: Positive for back pain and neck pain.  Skin: Negative.   Neurological: Negative.   Endo/Heme/Allergies: Negative.   Psychiatric/Behavioral: Negative.      Physical Exam:  weight is 117 lb 8 oz (53.3 kg). Her oral temperature is 97.7 F (36.5 C). Her blood pressure is 126/61 and her pulse is 77. Her respiration is  18 and oxygen saturation is 99%.   Wt Readings from Last 3 Encounters:  05/06/18 117 lb 8 oz (53.3 kg)  04/29/18 116 lb (52.6 kg)  04/13/18 116 lb 12.8 oz (53 kg)    Physical Exam Vitals signs reviewed.  HENT:     Head: Normocephalic and atraumatic.  Eyes:     Pupils: Pupils are equal, round, and reactive to light.  Neck:     Musculoskeletal: Normal range of motion.  Cardiovascular:     Rate and Rhythm: Normal rate and regular rhythm.     Heart sounds: Normal heart sounds.  Pulmonary:     Effort: Pulmonary effort is normal.     Breath sounds: Normal breath sounds.  Abdominal:     General: Bowel sounds are normal.     Palpations: Abdomen is soft.    Musculoskeletal: Normal range of motion.        General: No tenderness or deformity.  Lymphadenopathy:     Cervical: No cervical adenopathy.  Skin:    General: Skin is warm and dry.     Findings: No erythema or rash.  Neurological:     Mental Status: She is alert and oriented to person, place, and time.  Psychiatric:        Behavior: Behavior normal.        Thought Content: Thought content normal.        Judgment: Judgment normal.      Lab Results  Component Value Date   WBC 7.3 05/06/2018   HGB 11.7 (L) 05/06/2018   HCT 37.4 05/06/2018   MCV 95.2 05/06/2018   PLT 241 05/06/2018   Lab Results  Component Value Date   FERRITIN 784 (H) 04/13/2018   IRON 53 04/13/2018   TIBC 298 04/13/2018   UIBC 244 04/13/2018   IRONPCTSAT 18 (L) 04/13/2018   Lab Results  Component Value Date   RETICCTPCT 2.1 05/06/2018   RBC 3.93 05/06/2018   RBC 3.93 05/06/2018   No results found for: KPAFRELGTCHN, LAMBDASER, KAPLAMBRATIO No results found for: IGGSERUM, IGA, IGMSERUM No results found for: Samantha Quinn, SPEI   Chemistry      Component Value Date/Time   NA 137 04/13/2018 1320   NA 144 04/07/2017 1215   NA 135 (L) 03/11/2017 1416   K 4.1 04/13/2018 1320   K 4.0 04/07/2017 1215   K 3.9 03/11/2017 1416   CL 99 04/13/2018 1320   CL 103 04/07/2017 1215   CO2 28 04/13/2018 1320   CO2 29 04/07/2017 1215   CO2 23 03/11/2017 1416   BUN 17 04/13/2018 1320   BUN 13 04/07/2017 1215   BUN 16.1 03/11/2017 1416   CREATININE 0.68 04/13/2018 1320   CREATININE 0.8 04/07/2017 1215   CREATININE 0.9 03/11/2017 1416      Component Value Date/Time   CALCIUM 9.6 04/13/2018 1320   CALCIUM 9.6 04/07/2017 1215   CALCIUM 9.6 03/11/2017 1416   ALKPHOS 78 04/13/2018 1320   ALKPHOS 60 04/07/2017 1215   ALKPHOS 79 03/11/2017 1416   AST 19 04/13/2018 1320   AST 11 03/11/2017 1416   ALT 13 04/13/2018 1320   ALT 15 04/07/2017 1215   ALT 11  03/11/2017 1416   BILITOT 0.3 04/13/2018 1320   BILITOT 0.26 03/11/2017 1416      Impression and Plan: Samantha Quinn is a very pleasant 85 yo caucasian female with progressive metastatic non small cell adenocarcinoma of the lung.  Right now,  we will do Zometa in a couple weeks.  I will plan to see her back in about 5 weeks.  We will see what a PET scan looks like in March.  Hopefully, we will see that she still has stable disease.  If we need to, we could always have a radiation therapy be given to areas that might be active and uncomfortable.  Again, Ms. Beaulac has really done well.  We have been dealing with this metastatic lung cancer for quite a while and yet she has still maintained a decent quality of life.    [name], MD 1/10/20202:55 PM

## 2018-05-09 LAB — IRON AND TIBC
Iron: 70 ug/dL (ref 41–142)
Saturation Ratios: 21 % (ref 21–57)
TIBC: 326 ug/dL (ref 236–444)
UIBC: 256 ug/dL (ref 120–384)

## 2018-05-09 LAB — FERRITIN: Ferritin: 1600 ng/mL — ABNORMAL HIGH (ref 11–307)

## 2018-05-10 ENCOUNTER — Telehealth: Payer: Self-pay | Admitting: Cardiology

## 2018-05-10 NOTE — Telephone Encounter (Signed)
Rec'd from [facility] forwarded 13 pages to Dr. Davina Poke

## 2018-05-16 ENCOUNTER — Encounter: Payer: Self-pay | Admitting: Internal Medicine

## 2018-05-20 ENCOUNTER — Inpatient Hospital Stay: Payer: Medicare Other

## 2018-05-20 VITALS — BP 122/65 | HR 73 | Temp 97.7°F | Resp 20

## 2018-05-20 DIAGNOSIS — Z923 Personal history of irradiation: Secondary | ICD-10-CM | POA: Diagnosis not present

## 2018-05-20 DIAGNOSIS — C3431 Malignant neoplasm of lower lobe, right bronchus or lung: Secondary | ICD-10-CM

## 2018-05-20 DIAGNOSIS — C7951 Secondary malignant neoplasm of bone: Secondary | ICD-10-CM | POA: Diagnosis not present

## 2018-05-20 DIAGNOSIS — D508 Other iron deficiency anemias: Secondary | ICD-10-CM

## 2018-05-20 DIAGNOSIS — J90 Pleural effusion, not elsewhere classified: Secondary | ICD-10-CM | POA: Diagnosis not present

## 2018-05-20 MED ORDER — ZOLEDRONIC ACID 4 MG/5ML IV CONC
3.0000 mg | Freq: Once | INTRAVENOUS | Status: AC
  Administered 2018-05-20: 3 mg via INTRAVENOUS
  Filled 2018-05-20: qty 3.75

## 2018-05-20 MED ORDER — SODIUM CHLORIDE 0.9 % IV SOLN
INTRAVENOUS | Status: DC
  Administered 2018-05-20: 12:00:00 via INTRAVENOUS
  Filled 2018-05-20: qty 250

## 2018-05-20 NOTE — Patient Instructions (Signed)

## 2018-06-09 ENCOUNTER — Other Ambulatory Visit: Payer: Self-pay

## 2018-06-09 ENCOUNTER — Inpatient Hospital Stay: Payer: Medicare Other | Attending: Hematology & Oncology | Admitting: Hematology & Oncology

## 2018-06-09 ENCOUNTER — Encounter: Payer: Self-pay | Admitting: Hematology & Oncology

## 2018-06-09 ENCOUNTER — Inpatient Hospital Stay: Payer: Medicare Other

## 2018-06-09 VITALS — BP 134/66 | HR 70 | Temp 98.3°F | Resp 16 | Wt 118.0 lb

## 2018-06-09 DIAGNOSIS — C3431 Malignant neoplasm of lower lobe, right bronchus or lung: Secondary | ICD-10-CM

## 2018-06-09 DIAGNOSIS — D500 Iron deficiency anemia secondary to blood loss (chronic): Secondary | ICD-10-CM

## 2018-06-09 DIAGNOSIS — Z923 Personal history of irradiation: Secondary | ICD-10-CM | POA: Diagnosis not present

## 2018-06-09 DIAGNOSIS — C7951 Secondary malignant neoplasm of bone: Secondary | ICD-10-CM | POA: Diagnosis not present

## 2018-06-09 DIAGNOSIS — D508 Other iron deficiency anemias: Secondary | ICD-10-CM

## 2018-06-09 LAB — CMP (CANCER CENTER ONLY)
ALT: 7 U/L (ref 0–44)
AST: 15 U/L (ref 15–41)
Albumin: 4.5 g/dL (ref 3.5–5.0)
Alkaline Phosphatase: 85 U/L (ref 38–126)
Anion gap: 10 (ref 5–15)
BILIRUBIN TOTAL: 0.3 mg/dL (ref 0.3–1.2)
BUN: 15 mg/dL (ref 8–23)
CO2: 29 mmol/L (ref 22–32)
Calcium: 10.2 mg/dL (ref 8.9–10.3)
Chloride: 100 mmol/L (ref 98–111)
Creatinine: 0.73 mg/dL (ref 0.44–1.00)
GFR, Est AFR Am: >60 mL/min (ref >60)
GFR, Estimated: >60 mL/min (ref >60)
Glucose, Bld: 104 mg/dL — ABNORMAL HIGH (ref 70–99)
Potassium: 4.7 mmol/L (ref 3.5–5.1)
Sodium: 139 mmol/L (ref 135–145)
TOTAL PROTEIN: 7.1 g/dL (ref 6.5–8.1)

## 2018-06-09 LAB — CBC WITH DIFFERENTIAL (CANCER CENTER ONLY)
Abs Immature Granulocytes: 0.03 10*3/uL (ref 0.00–0.07)
Basophils Absolute: 0 10*3/uL (ref 0.0–0.1)
Basophils Relative: 0 %
Eosinophils Absolute: 0.1 10*3/uL (ref 0.0–0.5)
Eosinophils Relative: 1 %
HCT: 37.4 % (ref 36.0–46.0)
Hemoglobin: 11.9 g/dL — ABNORMAL LOW (ref 12.0–15.0)
Immature Granulocytes: 1 %
Lymphocytes Relative: 8 %
Lymphs Abs: 0.5 10*3/uL — ABNORMAL LOW (ref 0.7–4.0)
MCH: 29.3 pg (ref 26.0–34.0)
MCHC: 31.8 g/dL (ref 30.0–36.0)
MCV: 92.1 fL (ref 80.0–100.0)
Monocytes Absolute: 0.4 10*3/uL (ref 0.1–1.0)
Monocytes Relative: 7 %
Neutro Abs: 4.7 10*3/uL (ref 1.7–7.7)
Neutrophils Relative %: 83 %
Platelet Count: 205 10*3/uL (ref 150–400)
RBC: 4.06 MIL/uL (ref 3.87–5.11)
RDW: 14.7 % (ref 11.5–15.5)
WBC Count: 5.6 10*3/uL (ref 4.0–10.5)
nRBC: 0 % (ref 0.0–0.2)

## 2018-06-09 MED ORDER — ZOLEDRONIC ACID 4 MG/5ML IV CONC
3.0000 mg | Freq: Once | INTRAVENOUS | Status: AC
  Administered 2018-06-09: 3 mg via INTRAVENOUS
  Filled 2018-06-09: qty 3.75

## 2018-06-09 NOTE — Progress Notes (Signed)
Hematology and Oncology Follow Up Visit  Samantha Quinn [id number] 1927/06/18 85 y.o. 06/09/2018   Principle Diagnosis:  Stage IV adenocarcinoma of the right lung-mutation negative C6-7 left nerve root compression secondary to metastatic disease - PD-L1 (0%) /TMB of 9  Past Therapy: Status post radiation/chemotherapy for recurrence-completed October 2017 Status post right upper lobectomy in March 2013 for stage IB disease XRT to the cervical spine - started 04/02/2017  Tecentriq 1200 mg IV every 3 weeks s/p cycle 8 -- d/c progression  Current Therapy:   Alimta/Avastin s/p cycle #5 -- d/c on 05/06/2018 Zometa 4 mg IV q3 weeks -next dose on February 2020    Interim History:  Samantha Quinn is here today for follow-up.  She is doing pretty well.  She really has no complaints at suggest that her cancer is getting worse.  She does complain of some swelling in her lower legs.  I suspect this probably is from her being on Norvasc for blood pressure.  The swelling really is minimal.  I told her that I would not think that a change in her blood pressure medicine is warranted.  She also complains of some discomfort over on the right chest wall.  This is more prominent when she bends over.  This might be related to her malignancy.  Again, she has no shortness of breath.  There is no cough.  We will set her up with a PET scan in about 4 or 5 weeks.  This will show [place] how things are going.  Her weight is not gone down so I would have to believe that her cancer probably is not progressing all that much.    There is been no fever.  She has had no change in bowel or bladder habits.  Her appetite is not all that good but she is still trying to eat what she can.     Overall, her performance status is ECOG 2.    Medications:    Allergies:  Allergies  Allergen Reactions  . Ramipril Other (See Comments)    Tongue swelling  . Tramadol Other (See Comments)    syncope syncope  . Codeine Nausea And  Vomiting  . Demerol Nausea Only    Past Medical History, Surgical history, Social history, and Family History were reviewed and updated.  Review of Systems: Review of Systems  Constitutional: Negative.   HENT: Negative.   Eyes: Negative.   Respiratory: Negative.   Cardiovascular: Negative.   Gastrointestinal: Negative.   Genitourinary: Negative.   Musculoskeletal: Positive for back pain and neck pain.  Skin: Negative.   Neurological: Negative.   Endo/Heme/Allergies: Negative.   Psychiatric/Behavioral: Negative.      Physical Exam:  weight is 118 lb (53.5 kg). Her oral temperature is 98.3 F (36.8 C). Her blood pressure is 134/66 and her pulse is 70. Her respiration is 16 and oxygen saturation is 98%.   Wt Readings from Last 3 Encounters:  06/09/18 118 lb (53.5 kg)  05/06/18 117 lb 8 oz (53.3 kg)  04/29/18 116 lb (52.6 kg)    Physical Exam Vitals signs reviewed.  HENT:     Head: Normocephalic and atraumatic.  Eyes:     Pupils: Pupils are equal, round, and reactive to light.  Neck:     Musculoskeletal: Normal range of motion.  Cardiovascular:     Rate and Rhythm: Normal rate and regular rhythm.     Heart sounds: Normal heart sounds.  Pulmonary:     Effort: Pulmonary  effort is normal.     Breath sounds: Normal breath sounds.  Abdominal:     General: Bowel sounds are normal.     Palpations: Abdomen is soft.  Musculoskeletal: Normal range of motion.        General: No tenderness or deformity.  Lymphadenopathy:     Cervical: No cervical adenopathy.  Skin:    General: Skin is warm and dry.     Findings: No erythema or rash.  Neurological:     Mental Status: She is alert and oriented to person, place, and time.  Psychiatric:        Behavior: Behavior normal.        Thought Content: Thought content normal.        Judgment: Judgment normal.      Lab Results  Component Value Date   WBC 5.6 06/09/2018   HGB 11.9 (L) 06/09/2018   HCT 37.4 06/09/2018   MCV  92.1 06/09/2018   PLT 205 06/09/2018   Lab Results  Component Value Date   FERRITIN 1,600 (H) 05/06/2018   IRON 70 05/06/2018   TIBC 326 05/06/2018   UIBC 256 05/06/2018   IRONPCTSAT 21 05/06/2018   Lab Results  Component Value Date   RETICCTPCT 2.1 05/06/2018   RBC 4.06 06/09/2018   No results found for: KPAFRELGTCHN, LAMBDASER, KAPLAMBRATIO No results found for: IGGSERUM, IGA, IGMSERUM No results found for: Odetta Pink, SPEI   Chemistry      Component Value Date/Time   NA 139 06/09/2018 1306   NA 144 04/07/2017 1215   NA 135 (L) 03/11/2017 1416   K 4.7 06/09/2018 1306   K 4.0 04/07/2017 1215   K 3.9 03/11/2017 1416   CL 100 06/09/2018 1306   CL 103 04/07/2017 1215   CO2 29 06/09/2018 1306   CO2 29 04/07/2017 1215   CO2 23 03/11/2017 1416   BUN 15 06/09/2018 1306   BUN 13 04/07/2017 1215   BUN 16.1 03/11/2017 1416   CREATININE 0.73 06/09/2018 1306   CREATININE 0.8 04/07/2017 1215   CREATININE 0.9 03/11/2017 1416      Component Value Date/Time   CALCIUM 10.2 06/09/2018 1306   CALCIUM 9.6 04/07/2017 1215   CALCIUM 9.6 03/11/2017 1416   ALKPHOS 85 06/09/2018 1306   ALKPHOS 60 04/07/2017 1215   ALKPHOS 79 03/11/2017 1416   AST 15 06/09/2018 1306   AST 11 03/11/2017 1416   ALT 7 06/09/2018 1306   ALT 15 04/07/2017 1215   ALT 11 03/11/2017 1416   BILITOT 0.3 06/09/2018 1306   BILITOT 0.26 03/11/2017 1416      Impression and Plan: Samantha Quinn is a very pleasant 85 yo caucasian female with progressive metastatic non small cell adenocarcinoma of the lung.  We will go ahead with her Zometa today.  We will do the PET scan in a few weeks.  I will then plan to get her back afterwards.  We will do her Zometa when we see her back.  She is not a candidate for any systemic chemotherapy.  We are focused on her quality of life.  [name], MD 2/13/20202:03 PM

## 2018-06-09 NOTE — Patient Instructions (Signed)

## 2018-06-10 LAB — IRON AND TIBC
Iron: 54 ug/dL (ref 41–142)
Saturation Ratios: 16 % — ABNORMAL LOW (ref 21–57)
TIBC: 330 ug/dL (ref 236–444)
UIBC: 276 ug/dL (ref 120–384)

## 2018-06-10 LAB — FERRITIN: FERRITIN: 604 ng/mL — AB (ref 11–307)

## 2018-06-14 ENCOUNTER — Telehealth: Payer: Self-pay | Admitting: Hematology & Oncology

## 2018-06-14 ENCOUNTER — Other Ambulatory Visit: Payer: Self-pay | Admitting: Internal Medicine

## 2018-06-14 NOTE — Telephone Encounter (Signed)
Returned call to patient re PET scan 3/20 at 1030 am. Pt aware of instructions. Pt wished to cxl infuasion appt 2/20

## 2018-06-14 NOTE — Telephone Encounter (Signed)
Appointment scheduled LMVM for patient with date/time and asked her to call back to confirm per 2/14 Results Note

## 2018-06-16 ENCOUNTER — Ambulatory Visit: Payer: Medicare Other

## 2018-06-16 ENCOUNTER — Telehealth: Payer: Self-pay | Admitting: *Deleted

## 2018-06-16 NOTE — Telephone Encounter (Signed)
Message received from patient's daughter [name] requesting a call back regarding iron infusion appt on Monday.  Call placed back to [facility] and appt confirmed that Dr. Marin Olp does want patient to come in for iron infusion on Monday.  Pt.'s daughter appreciative of call back and has no further questions at this time.

## 2018-06-20 ENCOUNTER — Other Ambulatory Visit: Payer: Self-pay

## 2018-06-20 ENCOUNTER — Inpatient Hospital Stay: Payer: Medicare Other

## 2018-06-20 VITALS — BP 112/58 | HR 70 | Temp 97.6°F | Resp 17

## 2018-06-20 DIAGNOSIS — D508 Other iron deficiency anemias: Secondary | ICD-10-CM

## 2018-06-20 DIAGNOSIS — C3431 Malignant neoplasm of lower lobe, right bronchus or lung: Secondary | ICD-10-CM | POA: Diagnosis not present

## 2018-06-20 DIAGNOSIS — C7951 Secondary malignant neoplasm of bone: Secondary | ICD-10-CM | POA: Diagnosis not present

## 2018-06-20 DIAGNOSIS — Z923 Personal history of irradiation: Secondary | ICD-10-CM | POA: Diagnosis not present

## 2018-06-20 DIAGNOSIS — D500 Iron deficiency anemia secondary to blood loss (chronic): Secondary | ICD-10-CM | POA: Diagnosis not present

## 2018-06-20 MED ORDER — SODIUM CHLORIDE 0.9 % IV SOLN
510.0000 mg | Freq: Once | INTRAVENOUS | Status: AC
  Administered 2018-06-20: 510 mg via INTRAVENOUS
  Filled 2018-06-20: qty 17

## 2018-06-20 MED ORDER — SODIUM CHLORIDE 0.9 % IV SOLN
INTRAVENOUS | Status: DC
  Administered 2018-06-20: 13:00:00 via INTRAVENOUS
  Filled 2018-06-20: qty 250

## 2018-06-20 NOTE — Patient Instructions (Signed)

## 2018-07-07 ENCOUNTER — Other Ambulatory Visit: Payer: Self-pay

## 2018-07-07 MED ORDER — AMLODIPINE BESYLATE 5 MG PO TABS: 5.0000 mg | ORAL_TABLET | Freq: Every day | ORAL | 1 refills | Status: DC

## 2018-07-11 ENCOUNTER — Other Ambulatory Visit: Payer: Self-pay

## 2018-07-11 ENCOUNTER — Other Ambulatory Visit (INDEPENDENT_AMBULATORY_CARE_PROVIDER_SITE_OTHER): Payer: Medicare Other

## 2018-07-11 DIAGNOSIS — E0520 Thyrotoxicosis with toxic multinodular goiter without thyrotoxic crisis or storm: Secondary | ICD-10-CM

## 2018-07-11 LAB — TSH: TSH: 5 u[IU]/mL — ABNORMAL HIGH (ref 0.35–4.50)

## 2018-07-11 LAB — T4, FREE: Free T4: 0.69 ng/dL (ref 0.60–1.60)

## 2018-07-11 LAB — T3, FREE: T3, Free: 3.8 pg/mL (ref 2.3–4.2)

## 2018-07-12 ENCOUNTER — Other Ambulatory Visit: Payer: Self-pay | Admitting: Internal Medicine

## 2018-07-12 DIAGNOSIS — E0520 Thyrotoxicosis with toxic multinodular goiter without thyrotoxic crisis or storm: Secondary | ICD-10-CM

## 2018-07-12 MED ORDER — METHIMAZOLE 5 MG PO TABS: ORAL_TABLET | ORAL | 2 refills | Status: AC

## 2018-07-14 ENCOUNTER — Other Ambulatory Visit: Payer: Self-pay | Admitting: Family

## 2018-07-14 DIAGNOSIS — C3431 Malignant neoplasm of lower lobe, right bronchus or lung: Secondary | ICD-10-CM

## 2018-07-14 DIAGNOSIS — C7951 Secondary malignant neoplasm of bone: Secondary | ICD-10-CM

## 2018-07-14 DIAGNOSIS — C3490 Malignant neoplasm of unspecified part of unspecified bronchus or lung: Secondary | ICD-10-CM

## 2018-07-15 ENCOUNTER — Other Ambulatory Visit: Payer: Self-pay

## 2018-07-15 ENCOUNTER — Ambulatory Visit (HOSPITAL_COMMUNITY)
Admission: RE | Admit: 2018-07-15 | Discharge: 2018-07-15 | Disposition: A | Discharge status: Home / Self Care | Payer: Medicare Other | Source: Ambulatory Visit | Attending: Family | Admitting: Family

## 2018-07-15 ENCOUNTER — Ambulatory Visit (HOSPITAL_COMMUNITY): Payer: Medicare Other

## 2018-07-15 DIAGNOSIS — C7951 Secondary malignant neoplasm of bone: Secondary | ICD-10-CM | POA: Diagnosis not present

## 2018-07-15 DIAGNOSIS — C3490 Malignant neoplasm of unspecified part of unspecified bronchus or lung: Secondary | ICD-10-CM | POA: Insufficient documentation

## 2018-07-15 DIAGNOSIS — C3431 Malignant neoplasm of lower lobe, right bronchus or lung: Secondary | ICD-10-CM

## 2018-07-15 DIAGNOSIS — J90 Pleural effusion, not elsewhere classified: Secondary | ICD-10-CM | POA: Insufficient documentation

## 2018-07-15 LAB — GLUCOSE, CAPILLARY: GLUCOSE-CAPILLARY: 103 mg/dL — AB (ref 70–99)

## 2018-07-15 MED ORDER — FLUDEOXYGLUCOSE F - 18 (FDG) INJECTION
6.3000 | Freq: Once | INTRAVENOUS | Status: AC
  Administered 2018-07-15: 6.3 via INTRAVENOUS

## 2018-07-18 ENCOUNTER — Telehealth: Payer: Self-pay | Admitting: *Deleted

## 2018-07-18 NOTE — Telephone Encounter (Signed)
Patient called asking if she should still keep her appointment on 07/22/2018.  Dr. Marin Olp stated that he would still like to see patient and for her to get her Zometa infusion.  Patient also inquired whether it would hurt to reschedule her dermatology appontment on the same day.  Dr. Marin Olp stated ok to reschedule this appointment.

## 2018-07-18 NOTE — Telephone Encounter (Addendum)
-----   Message from Samantha Napoleon, MD sent at 07/17/2018  7:21 PM EDT ----- Called patient the PET scan shows no growth of the cancer!!  No new areas are found.  This is great!!!  Stay home!!!

## 2018-07-22 ENCOUNTER — Inpatient Hospital Stay: Payer: Medicare Other

## 2018-07-22 ENCOUNTER — Inpatient Hospital Stay: Payer: Medicare Other | Attending: Hematology & Oncology | Admitting: Hematology & Oncology

## 2018-07-22 ENCOUNTER — Other Ambulatory Visit: Payer: Self-pay

## 2018-07-22 VITALS — BP 126/67 | HR 69 | Temp 98.0°F | Resp 16 | Wt 119.0 lb

## 2018-07-22 DIAGNOSIS — C3431 Malignant neoplasm of lower lobe, right bronchus or lung: Secondary | ICD-10-CM | POA: Diagnosis not present

## 2018-07-22 DIAGNOSIS — D508 Other iron deficiency anemias: Secondary | ICD-10-CM

## 2018-07-22 DIAGNOSIS — Z79899 Other long term (current) drug therapy: Secondary | ICD-10-CM | POA: Diagnosis not present

## 2018-07-22 DIAGNOSIS — Z9221 Personal history of antineoplastic chemotherapy: Secondary | ICD-10-CM | POA: Insufficient documentation

## 2018-07-22 DIAGNOSIS — Z923 Personal history of irradiation: Secondary | ICD-10-CM | POA: Diagnosis not present

## 2018-07-22 DIAGNOSIS — C7951 Secondary malignant neoplasm of bone: Secondary | ICD-10-CM | POA: Insufficient documentation

## 2018-07-22 LAB — CBC WITH DIFFERENTIAL (CANCER CENTER ONLY)
Abs Immature Granulocytes: 0.03 10*3/uL (ref 0.00–0.07)
BASOS ABS: 0 10*3/uL (ref 0.0–0.1)
Basophils Relative: 1 %
Eosinophils Absolute: 0 10*3/uL (ref 0.0–0.5)
Eosinophils Relative: 1 %
HCT: 39.5 % (ref 36.0–46.0)
Hemoglobin: 12.5 g/dL (ref 12.0–15.0)
Immature Granulocytes: 1 %
Lymphocytes Relative: 7 %
Lymphs Abs: 0.4 10*3/uL — ABNORMAL LOW (ref 0.7–4.0)
MCH: 29.1 pg (ref 26.0–34.0)
MCHC: 31.6 g/dL (ref 30.0–36.0)
MCV: 91.9 fL (ref 80.0–100.0)
Monocytes Absolute: 0.4 10*3/uL (ref 0.1–1.0)
Monocytes Relative: 6 %
Neutro Abs: 5.2 10*3/uL (ref 1.7–7.7)
Neutrophils Relative %: 84 %
Platelet Count: 201 10*3/uL (ref 150–400)
RBC: 4.3 MIL/uL (ref 3.87–5.11)
RDW: 14.7 % (ref 11.5–15.5)
WBC Count: 6.1 10*3/uL (ref 4.0–10.5)
nRBC: 0 % (ref 0.0–0.2)

## 2018-07-22 LAB — CMP (CANCER CENTER ONLY)
ALT: 8 U/L (ref 0–44)
AST: 15 U/L (ref 15–41)
Albumin: 4.4 g/dL (ref 3.5–5.0)
Alkaline Phosphatase: 85 U/L (ref 38–126)
Anion gap: 12 (ref 5–15)
BUN: 16 mg/dL (ref 8–23)
CO2: 26 mmol/L (ref 22–32)
Calcium: 9.6 mg/dL (ref 8.9–10.3)
Chloride: 100 mmol/L (ref 98–111)
Creatinine: 0.63 mg/dL (ref 0.44–1.00)
GFR, Est AFR Am: >60 mL/min (ref >60)
GFR, Estimated: >60 mL/min (ref >60)
Glucose, Bld: 81 mg/dL (ref 70–99)
Potassium: 4.1 mmol/L (ref 3.5–5.1)
Sodium: 138 mmol/L (ref 135–145)
Total Bilirubin: 0.4 mg/dL (ref 0.3–1.2)
Total Protein: 7.5 g/dL (ref 6.5–8.1)

## 2018-07-22 LAB — LACTATE DEHYDROGENASE: LDH: 270 U/L — ABNORMAL HIGH (ref 98–192)

## 2018-07-22 MED ORDER — ZOLEDRONIC ACID 4 MG/5ML IV CONC
3.0000 mg | Freq: Once | INTRAVENOUS | Status: AC
  Administered 2018-07-22: 3 mg via INTRAVENOUS
  Filled 2018-07-22: qty 3.75

## 2018-07-22 MED ORDER — SODIUM CHLORIDE 0.9 % IV SOLN
INTRAVENOUS | Status: DC
  Administered 2018-07-22: 14:00:00 via INTRAVENOUS
  Filled 2018-07-22: qty 250

## 2018-07-22 NOTE — Progress Notes (Signed)
Hematology and Oncology Follow Up Visit  Samantha Quinn [id number] 10-21-1927 85 y.o. 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 Hostetter is here today for follow-up.  It is amazing how well she is doing.  She looks better every time I see her.  And she is gained a little bit of weight.  Her appetite is doing well.  The more importantly is the fact that her PET scan that was done on 07/15/2018 did not show any evidence of progressive disease.  She does have some back discomfort.  She has arthritis.  She does have some bony metastases which are stable and not all that active.  There is no fractures.  Again, I think that the lower back issue is multi-factorial.  She has had no issues with cough or shortness of breath.  She has had no headache.  She has had chronic neck issues.  There is no arm or leg weakness.  I must say that she is really done well.  She is had no treatment with chemotherapy for almost 3 months.  Overall, her performance status is ECOG 2.    Medications:    Allergies:  Allergies  Allergen Reactions  . Ramipril Other (See Comments)    Tongue swelling  . Tramadol Other (See Comments)    syncope syncope  . Codeine Nausea And Vomiting  . Demerol Nausea Only    Past Medical History, Surgical history, Social history, and Family History were reviewed and updated.  Review of Systems: Review of Systems   Constitutional: Negative.   HENT: Negative.   Eyes: Negative.   Respiratory: Negative.   Cardiovascular: Negative.   Gastrointestinal: Negative.   Genitourinary: Negative.   Musculoskeletal: Positive for back pain and neck pain.  Skin: Negative.   Neurological: Negative.   Endo/Heme/Allergies: Negative.   Psychiatric/Behavioral: Negative.      Physical Exam:  weight is 119 lb (54 kg). Her oral temperature is 98 F (36.7 C). Her blood pressure is 126/67 and her pulse is 69. Her respiration is 16 and oxygen saturation is 100%.   Wt Readings from Last 3 Encounters:  07/22/18 119 lb (54 kg)  06/09/18 118 lb (53.5 kg)  05/06/18 117 lb 8 oz (53.3 kg)    Physical Exam Vitals signs reviewed.  HENT:     Head: Normocephalic and atraumatic.  Eyes:     Pupils: Pupils are equal, round, and reactive to light.  Neck:     Musculoskeletal: Normal range of motion.  Cardiovascular:     Rate and Rhythm: Normal rate and regular rhythm.     Heart sounds: Normal heart sounds.  Pulmonary:     Effort: Pulmonary effort is normal.     Breath sounds: Normal breath sounds.  Abdominal:     General: Bowel sounds are normal.     Palpations: Abdomen is soft.  Musculoskeletal: Normal range of motion.        General: No  tenderness or deformity.  Lymphadenopathy:     Cervical: No cervical adenopathy.  Skin:    General: Skin is warm and dry.     Findings: No erythema or rash.  Neurological:     Mental Status: She is alert and oriented to person, place, and time.  Psychiatric:        Behavior: Behavior normal.        Thought Content: Thought content normal.        Judgment: Judgment normal.      Lab Results  Component Value Date   WBC 6.1 07/22/2018   HGB 12.5 07/22/2018   HCT 39.5 07/22/2018   MCV 91.9 07/22/2018   PLT 201 07/22/2018   Lab Results  Component Value Date   FERRITIN 604 (H) 06/09/2018   IRON 54 06/09/2018   TIBC 330 06/09/2018   UIBC 276 06/09/2018   IRONPCTSAT 16  (L) 06/09/2018   Lab Results  Component Value Date   RETICCTPCT 2.1 05/06/2018   RBC 4.30 07/22/2018   No results found for: KPAFRELGTCHN, LAMBDASER, KAPLAMBRATIO No results found for: IGGSERUM, IGA, IGMSERUM No results found for: Odetta Pink, SPEI   Chemistry      Component Value Date/Time   NA 138 07/22/2018 1304   NA 144 04/07/2017 1215   NA 135 (L) 03/11/2017 1416   K 4.1 07/22/2018 1304   K 4.0 04/07/2017 1215   K 3.9 03/11/2017 1416   CL 100 07/22/2018 1304   CL 103 04/07/2017 1215   CO2 26 07/22/2018 1304   CO2 29 04/07/2017 1215   CO2 23 03/11/2017 1416   BUN 16 07/22/2018 1304   BUN 13 04/07/2017 1215   BUN 16.1 03/11/2017 1416   CREATININE 0.63 07/22/2018 1304   CREATININE 0.8 04/07/2017 1215   CREATININE 0.9 03/11/2017 1416      Component Value Date/Time   CALCIUM 9.6 07/22/2018 1304   CALCIUM 9.6 04/07/2017 1215   CALCIUM 9.6 03/11/2017 1416   ALKPHOS 85 07/22/2018 1304   ALKPHOS 60 04/07/2017 1215   ALKPHOS 79 03/11/2017 1416   AST 15 07/22/2018 1304   AST 11 03/11/2017 1416   ALT 8 07/22/2018 1304   ALT 15 04/07/2017 1215   ALT 11 03/11/2017 1416   BILITOT 0.4 07/22/2018 1304   BILITOT 0.26 03/11/2017 1416      Impression and Plan: Ms. Gavel is a very pleasant 85 yo caucasian female with progressive metastatic non small cell adenocarcinoma of the lung.  We will go ahead with her Zometa today.  I spent about 30 minutes with her today.  I have to spend quite a bit of time with her since her family cannot come with her.  I reviewed her PET results.  I reviewed her lab work.  I explained my recommendations.  I told her that we do not have to make any changes in her protocol.  We will plan to get her back to see [place] in another 5 weeks.  I do not think we need another PET scan probably for another 3 months.   [name], MD 3/27/20202:01 PM

## 2018-07-22 NOTE — Patient Instructions (Signed)

## 2018-07-25 LAB — IRON AND TIBC
Iron: 80 ug/dL (ref 41–142)
Saturation Ratios: 26 % (ref 21–57)
TIBC: 309 ug/dL (ref 236–444)
UIBC: 229 ug/dL (ref 120–384)

## 2018-07-25 LAB — FERRITIN: FERRITIN: 796 ng/mL — AB (ref 11–307)

## 2018-08-04 ENCOUNTER — Other Ambulatory Visit: Payer: Self-pay | Admitting: Internal Medicine

## 2018-08-04 DIAGNOSIS — E119 Type 2 diabetes mellitus without complications: Secondary | ICD-10-CM

## 2018-08-09 ENCOUNTER — Encounter (INDEPENDENT_AMBULATORY_CARE_PROVIDER_SITE_OTHER): Payer: Medicare Other | Admitting: Ophthalmology

## 2018-08-16 ENCOUNTER — Ambulatory Visit (INDEPENDENT_AMBULATORY_CARE_PROVIDER_SITE_OTHER): Payer: Medicare Other | Admitting: Internal Medicine

## 2018-08-16 ENCOUNTER — Other Ambulatory Visit: Payer: Self-pay

## 2018-08-16 ENCOUNTER — Encounter: Payer: Self-pay | Admitting: Internal Medicine

## 2018-08-16 ENCOUNTER — Other Ambulatory Visit (INDEPENDENT_AMBULATORY_CARE_PROVIDER_SITE_OTHER): Payer: Medicare Other

## 2018-08-16 DIAGNOSIS — E0590 Thyrotoxicosis, unspecified without thyrotoxic crisis or storm: Secondary | ICD-10-CM | POA: Diagnosis not present

## 2018-08-16 DIAGNOSIS — E0520 Thyrotoxicosis with toxic multinodular goiter without thyrotoxic crisis or storm: Secondary | ICD-10-CM | POA: Diagnosis not present

## 2018-08-16 LAB — T3, FREE: T3, Free: 3.6 pg/mL (ref 2.3–4.2)

## 2018-08-16 LAB — T4, FREE: Free T4: 0.9 ng/dL (ref 0.60–1.60)

## 2018-08-16 LAB — TSH: TSH: 1.6 u[IU]/mL (ref 0.35–4.50)

## 2018-08-16 NOTE — Patient Instructions (Signed)
Please come to the lab.  Continue Methimazole 2.5 mg every other day.  Come back for labs in January 2021.

## 2018-08-16 NOTE — Progress Notes (Addendum)
Patient ID: Samantha Quinn, female   DOB: [birth year], 85 y.o.   MRN: [id number]   Patient location: Home My location: Office  Referring Provider: Binnie Rail, MD  I connected with the patient on 08/16/18 at  11:52 am EDT by telephone and verified that I am speaking with the correct person.   I discussed the limitations of evaluation and management by telephone and the availability of in person appointments. The patient expressed understanding and agreed to proceed.   Details of the encounter are shown below.  HPI  Samantha Quinn is a 85 y.o.-year-old female, presenting for follow-up for thyrotoxic goiter. Last visit was 3 months ago.  Pt c/o nausea, hot flushes + chills, and increased hand tremor. Appetite is low.  Patient has a history of lung cancer and had radiation therapy and immunotherapy.  She also has vertebral metastases in the posterior cervical region for which she had surgery and vertebral fusion and then had another session of radiation therapy.  A PET scan obtained before last visit showed stable lesions with no new metastases.  Reviewed history: She also has a history of thyroiditis with subsequent hypothyroidism and was on levothyroxine for many years. However, the TSH study to be suppressed and her levothyroxine dose continued to be decreased until this was stopped.  We started methimazole (low-dose) she finished chemotherapy and had another restaging scan.  At last visit she was on 2.5 mg daily.  Her latest TSH was slightly elevated. We decreased the MMI to 2.5 mg every other day.  Reviewed her most recent TFTs: Lab Results  Component Value Date   TSH 5.00 (H) 07/11/2018   TSH 3.97 04/29/2018   TSH 5.27 (H) 10/27/2017   TSH 0.40 04/29/2017   TSH 0.60 01/18/2017   TSH 3.65 11/18/2016   TSH 0.56 09/16/2016   TSH 0.12 (L) 08/12/2016   TSH 0.02 (L) 06/17/2016   TSH 0.02 (L) 05/05/2016   FREET4 0.69 07/11/2018   FREET4 0.80 04/29/2018   FREET4 0.66 10/27/2017   FREET4 0.69 04/29/2017   FREET4 0.81 01/18/2017   FREET4 0.53 (L) 11/18/2016   FREET4 0.55 (L) 09/16/2016   FREET4 0.46 (L) 08/12/2016   FREET4 0.81 06/17/2016   FREET4 1.09 05/05/2016    Her Graves' antibodies were not elevated: Lab Results  Component Value Date   TSI <89 06/17/2016   She has a history of thyroid nodules, with a thyroid ultrasound in 08/29/2013 and the subsequent biopsy of the dominant right mid nodule measuring 3.1 x 1.9 x 2.1 cm. This was benign.  Thyroid uptake and scan 08/28/2016: uptake was elevated, at 40%, and the scan was positive for multinodular goiter.  Pt denies: - feeling nodules in neck - hoarseness - choking - SOB with lying down But she continues to have dysphagia and a dry cough after her radiation therapy. Pt does have a FH of thyroid ds.: mother - thyroid removed; sister - ?. No h/o radiation tx to head or neck. Had RxTx  - lung, cervical spine.  No FH of thyroid cancer. No h/o radiation tx to head or neck.  No seaweed or kelp. No recent contrast studies. No herbal supplements. No Biotin use. No recent steroids use.   Her ramipril was stopped because of tongue swelling.  ROS: Constitutional: no weight gain/+ slight weight loss, no fatigue, + see HPI Eyes: no blurry vision, no xerophthalmia ENT: no sore throat, + see HPI Cardiovascular: no CP/no SOB/no palpitations/no leg swelling Respiratory: no cough/no  SOB/no wheezing Gastrointestinal: + N/no V/no D/no C/no acid reflux Musculoskeletal: no muscle aches/no joint aches Skin: no rashes, no hair loss Neurological: + tremors/no numbness/no tingling/no dizziness  I reviewed pt's medications, allergies, PMH, social hx, family hx, and changes were documented in the history of present illness. Otherwise, unchanged from my initial visit note.  Past Medical History:  Diagnosis Date  . Anxiety   . Back pain    bulding disc  . Bronchitis    hx of-many,many years   . Diabetes mellitus   .  Diabetes mellitus    type 2 and takes Metformin bid  . Dyslipidemia   . Embolism - blood clot    hx of in left lower leg and was on Coumadin for about 1month;this was about 8-166yrago  . Family history of adverse reaction to anesthesia    Daughter- Nausea  . Goals of care, counseling/discussion 11/12/2017  . History of colonic polyps   . History of radiation therapy 01/06/16-02/19/16   The primary tumor and involved mediastinal adenopathy were treated to 66 Gy in 33 fractions of 2 Gy.  . Marland Kitchenistory of radiation therapy 04/01/17-04/26/17   cervical spine 35 Gy in 14 fractions  . HOH (hard of hearing)   . Hx of migraines    as a teenager  . Hypercholesterolemia   . Hyperlipidemia    takes Lovastatin every other day  . Hypertension    takes Ziac daily and Ramipril as well  . Hyperthyroidism   . Hypothyroidism    takes Synthroid every other day  . Insomnia    takes Elavil nightly  . Joint pain   . Lung mass   . Myalgia   . Osteoarthritis   . Osteoporosis   . Pneumothorax of right lung after biopsy 07/09/11   HAD CHEST TUBE PLACEMENT  . PONV (postoperative nausea and vomiting)   . Primary adenocarcinoma of lower lobe of right lung ([facility]   pT2a, pN0 M0 Stage 1 Well differenced Adenocarcinoma 3.5 cm resected 07/17/2011  . Primary adenocarcinoma of lung ([facility]2013  . Shortness of breath dyspnea    With exertion  . Skin cancer    legs have dark spots on them  . Upper respiratory infection 04/2011  . Urinary incontinence    takes Detrol daily prn   Past Surgical History:  Procedure Laterality Date  . ABDOMINAL HYSTERECTOMY    . ANTERIOR CERVICAL DECOMP/DISCECTOMY FUSION N/A 02/17/2017   Procedure: ANTERIOR CERVICAL DECOMPRESSION FUSION, CERVICAL 6-7 WITH INSTRUMENTATION AND ALLOGRAFT;  Surgeon: DuPhylliss BobMD;  [facility];  Laterality: N/A;  ANTERIOR CERVICAL DECOMPRESSION FUSION, CERVICAL 6-7 WITH INSTRUMENTATION AND ALLOGRAFT; REQUEST 2  HOURS AND FLIP   . APPENDECTOMY     as a child  . CHEST TUBE REMOVAL  07/13/11   RIGHT  CHEST TUBE  . CHOLECYSTECTOMY    . COLONOSCOPY    . COLONOSCOPY    . DILATION AND CURETTAGE OF UTERUS     x 2  . EYE SURGERY Bilateral    Cataract  . HEMORRHOID SURGERY    . IR THORACENTESIS ASP PLEURAL SPACE W/IMG GUIDE  02/03/2018  . LUNG BIOPSY  07/09/11   RIGHT UPER LOBE LUNG MASS   . LUNG LOBECTOMY Right 06/2011  . POSTERIOR CERVICAL FUSION/FORAMINOTOMY N/A 02/18/2017   Procedure: CERVICAL Five-Six, CERVICAL Six-Seven, CERVICAL Seven-THORACIC One, THORACIC One-Two POSTERIOR SPINAL FUSION WITH INSTRUMENTATION AND ALLOGRAFT;  Surgeon: DuPhylliss BobMD;  [facility] Service: [facility];  Laterality: N/A;  . RIGHT CHEST TUBE PLACEMENT  07/10/11   S/P LUNG BX  . ROTATOR CUFF REPAIR     bilateral  . TOE SURGERY  2013   right small toe  . TONSILLECTOMY     as a child  . VIDEO BRONCHOSCOPY  07/17/2011   Procedure: VIDEO BRONCHOSCOPY;  Surgeon: Grace Isaac, MD;  Location: [place];  Service: Thoracic;  Laterality: N/A;  . VIDEO BRONCHOSCOPY WITH ENDOBRONCHIAL ULTRASOUND N/A 12/10/2015   Procedure: VIDEO BRONCHOSCOPY WITH ENDOBRONCHIAL ULTRASOUND with biopsies.;  Surgeon: Grace Isaac, MD;  Location: [place];  Service: Thoracic;  Laterality: N/A;   Social History   Social History  . Marital status: Widowed    Spouse name: N/A  . Number of children: 3   Occupational History  . homemaker   Social History Main Topics  . Smoking status: Never Smoker  . Smokeless tobacco: Never Used  . Alcohol use No  . Drug use: No   Current Outpatient Medications on File Prior to Visit  Medication Sig Dispense Refill  . amitriptyline (ELAVIL) 10 MG tablet TAKE 1 TABLET BY MOUTH EVERYDAY AT BEDTIME 90 tablet 1  . amLODipine (NORVASC) 5 MG tablet Take 1 tablet (5 mg total) by mouth daily. 90 tablet 1  . bisoprolol-hydrochlorothiazide (ZIAC) 2.5-6.25 MG tablet TAKE 1 TABLET BY MOUTH EVERY DAY 90  tablet 1  . Blood Glucose Monitoring Suppl (ONE TOUCH ULTRA 2) w/Device KIT Use to check blood sugars twice a day Dx E11.9 1 each 0  . calcium carbonate (OS-CAL) 600 MG tablet Take 600 mg by mouth daily.    . cholecalciferol (VITAMIN D) 1000 units tablet Take 2,000 Units by mouth daily.     [name] gabapentin (NEURONTIN) 100 MG capsule Take 100 mg by mouth daily.     [name] glucose blood (ONE TOUCH ULTRA TEST) test strip 1 each by Other route 2 (two) times daily. Use to check blood sugars twice a day 100 each 3  . HYDROcodone-homatropine (HYCODAN) 5-1.5 MG/5ML syrup Take 5 mLs by mouth every 6 (six) hours as needed for cough. 240 mL 0  . Lancets (ONETOUCH ULTRASOFT) lancets 1 each by Other route 2 (two) times daily. Use to help check blood sugars twice a day 100 each 3  . lovastatin (MEVACOR) 20 MG tablet TAKE 1 TABLET BY MOUTH EVERY OTHER DAY 90 tablet 1  . metFORMIN (GLUCOPHAGE) 500 MG tablet Take 1 tablet (500 mg total) by mouth 2 (two) times daily with a meal. 180 tablet 1  . methimazole (TAPAZOLE) 5 MG tablet Take 2.5 mg every other day 45 tablet 2  . Multiple Vitamins-Minerals (VISION-VITE PRESERVE PO) Take 1 tablet by mouth daily.     . Probiotic Product (ALIGN PO) Take by mouth daily.    . [DISCONTINUED] prochlorperazine (COMPAZINE) 10 MG tablet Take 1 tablet (10 mg total) by mouth every 6 (six) hours as needed (Nausea or vomiting). 30 tablet 1   No current facility-administered medications on file prior to visit.    Allergies  Allergen Reactions  . Ramipril Other (See Comments)    Tongue swelling  . Tramadol Other (See Comments)    syncope syncope  . Codeine Nausea And Vomiting  . Demerol Nausea Only   Family History  Problem Relation Age of Onset  . Hypertension Mother   . Hypertension Father   . Arthritis Sister   . Arthritis Sister   . Diabetes Brother   . Hypertension Sister   .  Anesthesia problems Neg Hx   . Hypotension Neg Hx   . Malignant hyperthermia Neg Hx   . Pseudochol  deficiency Neg Hx   . Heart attack Neg Hx    PE: There were no vitals taken for this visit. Wt Readings from Last 3 Encounters:  07/22/18 119 lb (54 kg)  06/09/18 118 lb (53.5 kg)  05/06/18 117 lb 8 oz (53.3 kg)   Constitutional:  in NAD  The physical exam was not performed (telephone visit).  ASSESSMENT: 1. Thyrotoxicosis - h/o hypothyroidism  2. TMNG  PLAN:  1. And 2.  Patient with long history of low TSH, previously on levothyroxine, now on methimazole for toxic multinodular goiter.  Her TSI antibodies were not elevated to suggest Graves' disease and she also had a thyroid uptake and scan indicating the above diagnosis.  We discussed at previous visits about the possibility of RAI treatment, however, she would prefer not to have to do this.  She has had multiple health issues with her lung cancer, finishing immunotherapy, 2 rounds of chemotherapy and then chemotherapy. -At last visit, she was on 2.5 mg methimazole daily, but since then, approximately a month ago, we decreased the dose to 2.5 mg every other day.  I am hoping that we can stop the methimazole soon. -She scheduled this appointment urgently as she recently experienced an increase in tremors, and also nausea, hot flashes followed by chills.  She is wondering whether these are related to thyrotoxicosis.  I doubt that her TSH may have decreased so much to cause the symptoms from a slightly elevated 1 approximately a month ago, despite the decreasing methimazole dose.  However, we discussed that the only way to tell is by checking TFTs.  I will have her come by the office today to have labs and then I will be in touch with her about changing the dose of methimazole. -She denies palpitations and continues bisoprolol. -I will see her back at our next scheduled appointment, in 04/2019  Orders Placed This Encounter  Procedures  . TSH  . T4, free  . T3, free   - time spent with the patient: 13 min, of which >50% was spent in  obtaining information about her symptoms, reviewing her previous labs, evaluations, and treatments, counseling her about her condition (please see the discussed topics above), and developing a plan to further investigate and treat it; she had a number of questions which I addressed.  Lab on 08/16/2018  Component Date Value Ref Range Status  . T3, Free 08/16/2018 3.6  2.3 - 4.2 pg/mL Final  . Free T4 08/16/2018 0.90  0.60 - 1.60 ng/dL Final   Comment: Specimens from patients who are undergoing biotin therapy and /or ingesting biotin supplements may contain high levels of biotin.  The higher biotin concentration in these specimens interferes with this Free T4 assay.  Specimens that contain high levels  of biotin may cause false high results for this Free T4 assay.  Please interpret results in light of the total clinical presentation of the patient.    [name] TSH 08/16/2018 1.60  0.35 - 4.50 uIU/mL Final   Normal labs.  Philemon Kingdom, MD PhD [facility] Endocrinology

## 2018-08-17 ENCOUNTER — Telehealth: Payer: Self-pay | Admitting: *Deleted

## 2018-08-17 ENCOUNTER — Telehealth: Payer: Self-pay | Admitting: Hematology & Oncology

## 2018-08-17 NOTE — Telephone Encounter (Signed)
Patient c/o multiple symptoms. She states on Saturday she developed nausea and decreased appetite. She did not vomit. She has antiemetics at home which she uses. She continues to 'force' herself to eat and she feels that the nausea is getting better. She also has symptoms of feeling hot and cold. She has had some drenching sweats, but has checked her temperature several times and denies any fever. She has had some shakiness to her hands. Denies diarrhea. Denies any signs of bleeding. She did have concern that her thyroid may be abnormal, but was able to have that checked with her endocrinologist and the levels are normal.   Patient has a scheduled appointment 08/26/2018  Reviewed symptoms with Dr Marin Olp. He would like to see if patient's appointment can be moved up. Sent message to schedulers and patient aware of changing appointments.

## 2018-08-17 NOTE — Telephone Encounter (Signed)
Called and spoke with patient regarding r/s her appts per 4/22 sch msg

## 2018-08-19 ENCOUNTER — Other Ambulatory Visit: Payer: Self-pay

## 2018-08-19 ENCOUNTER — Other Ambulatory Visit: Payer: Self-pay | Admitting: *Deleted

## 2018-08-19 ENCOUNTER — Inpatient Hospital Stay: Payer: Medicare Other | Attending: Hematology & Oncology

## 2018-08-19 DIAGNOSIS — C7951 Secondary malignant neoplasm of bone: Secondary | ICD-10-CM | POA: Diagnosis not present

## 2018-08-19 DIAGNOSIS — C3431 Malignant neoplasm of lower lobe, right bronchus or lung: Secondary | ICD-10-CM | POA: Insufficient documentation

## 2018-08-19 DIAGNOSIS — Z902 Acquired absence of lung [part of]: Secondary | ICD-10-CM | POA: Insufficient documentation

## 2018-08-19 DIAGNOSIS — C3490 Malignant neoplasm of unspecified part of unspecified bronchus or lung: Secondary | ICD-10-CM

## 2018-08-19 DIAGNOSIS — D508 Other iron deficiency anemias: Secondary | ICD-10-CM

## 2018-08-19 DIAGNOSIS — Z923 Personal history of irradiation: Secondary | ICD-10-CM | POA: Insufficient documentation

## 2018-08-19 DIAGNOSIS — D509 Iron deficiency anemia, unspecified: Secondary | ICD-10-CM | POA: Diagnosis not present

## 2018-08-19 LAB — CBC WITH DIFFERENTIAL (CANCER CENTER ONLY)
Abs Immature Granulocytes: 0.06 10*3/uL (ref 0.00–0.07)
Basophils Absolute: 0 10*3/uL (ref 0.0–0.1)
Basophils Relative: 0 %
Eosinophils Absolute: 0 10*3/uL (ref 0.0–0.5)
Eosinophils Relative: 0 %
HCT: 35.4 % — ABNORMAL LOW (ref 36.0–46.0)
Hemoglobin: 11.9 g/dL — ABNORMAL LOW (ref 12.0–15.0)
Immature Granulocytes: 2 %
Lymphocytes Relative: 9 %
Lymphs Abs: 0.3 10*3/uL — ABNORMAL LOW (ref 0.7–4.0)
MCH: 29.5 pg (ref 26.0–34.0)
MCHC: 33.6 g/dL (ref 30.0–36.0)
MCV: 87.6 fL (ref 80.0–100.0)
Monocytes Absolute: 0.3 10*3/uL (ref 0.1–1.0)
Monocytes Relative: 10 %
Neutro Abs: 2.5 10*3/uL (ref 1.7–7.7)
Neutrophils Relative %: 79 %
Platelet Count: 170 10*3/uL (ref 150–400)
RBC: 4.04 MIL/uL (ref 3.87–5.11)
RDW: 14.9 % (ref 11.5–15.5)
WBC Count: 3.1 10*3/uL — ABNORMAL LOW (ref 4.0–10.5)
nRBC: 0 % (ref 0.0–0.2)

## 2018-08-19 LAB — CMP (CANCER CENTER ONLY)
ALT: 20 U/L (ref 0–44)
AST: 27 U/L (ref 15–41)
Albumin: 4.1 g/dL (ref 3.5–5.0)
Alkaline Phosphatase: 83 U/L (ref 38–126)
Anion gap: 12 (ref 5–15)
BUN: 19 mg/dL (ref 8–23)
CO2: 25 mmol/L (ref 22–32)
Calcium: 9.7 mg/dL (ref 8.9–10.3)
Chloride: 96 mmol/L — ABNORMAL LOW (ref 98–111)
Creatinine: 0.86 mg/dL (ref 0.44–1.00)
GFR, Est AFR Am: >60 mL/min (ref >60)
GFR, Estimated: >60 mL/min (ref >60)
Glucose, Bld: 109 mg/dL — ABNORMAL HIGH (ref 70–99)
Potassium: 3.7 mmol/L (ref 3.5–5.1)
Sodium: 133 mmol/L — ABNORMAL LOW (ref 135–145)
Total Bilirubin: 0.5 mg/dL (ref 0.3–1.2)
Total Protein: 6.7 g/dL (ref 6.5–8.1)

## 2018-08-19 LAB — SAMPLE TO BLOOD BANK

## 2018-08-19 LAB — LACTATE DEHYDROGENASE: LDH: 384 U/L — ABNORMAL HIGH (ref 98–192)

## 2018-08-22 ENCOUNTER — Other Ambulatory Visit: Payer: Self-pay

## 2018-08-22 ENCOUNTER — Inpatient Hospital Stay (HOSPITAL_BASED_OUTPATIENT_CLINIC_OR_DEPARTMENT_OTHER): Payer: Medicare Other | Admitting: Hematology & Oncology

## 2018-08-22 ENCOUNTER — Inpatient Hospital Stay: Payer: Medicare Other

## 2018-08-22 ENCOUNTER — Other Ambulatory Visit: Payer: Medicare Other

## 2018-08-22 ENCOUNTER — Encounter: Payer: Self-pay | Admitting: Hematology & Oncology

## 2018-08-22 VITALS — BP 123/73 | HR 97 | Temp 97.8°F | Resp 18 | Wt 115.0 lb

## 2018-08-22 DIAGNOSIS — C7951 Secondary malignant neoplasm of bone: Secondary | ICD-10-CM

## 2018-08-22 DIAGNOSIS — C3431 Malignant neoplasm of lower lobe, right bronchus or lung: Secondary | ICD-10-CM

## 2018-08-22 DIAGNOSIS — Z902 Acquired absence of lung [part of]: Secondary | ICD-10-CM | POA: Diagnosis not present

## 2018-08-22 DIAGNOSIS — D509 Iron deficiency anemia, unspecified: Secondary | ICD-10-CM

## 2018-08-22 DIAGNOSIS — Z923 Personal history of irradiation: Secondary | ICD-10-CM

## 2018-08-22 DIAGNOSIS — D508 Other iron deficiency anemias: Secondary | ICD-10-CM

## 2018-08-22 DIAGNOSIS — C3490 Malignant neoplasm of unspecified part of unspecified bronchus or lung: Secondary | ICD-10-CM

## 2018-08-22 LAB — FERRITIN: Ferritin: 5794 ng/mL — ABNORMAL HIGH (ref 11–307)

## 2018-08-22 LAB — IRON AND TIBC
Iron: 34 ug/dL — ABNORMAL LOW (ref 41–142)
Saturation Ratios: 14 % — ABNORMAL LOW (ref 21–57)
TIBC: 246 ug/dL (ref 236–444)
UIBC: 213 ug/dL (ref 120–384)

## 2018-08-22 MED ORDER — ZOLEDRONIC ACID 4 MG/5ML IV CONC
3.0000 mg | Freq: Once | INTRAVENOUS | Status: AC
  Administered 2018-08-22: 3 mg via INTRAVENOUS
  Filled 2018-08-22: qty 3.75

## 2018-08-22 MED ORDER — SODIUM CHLORIDE 0.9 % IV SOLN
Freq: Once | INTRAVENOUS | Status: AC
  Administered 2018-08-22: 14:00:00 via INTRAVENOUS
  Filled 2018-08-22: qty 250

## 2018-08-22 MED ORDER — SODIUM CHLORIDE 0.9 % IV SOLN
510.0000 mg | Freq: Once | INTRAVENOUS | Status: AC
  Administered 2018-08-22: 510 mg via INTRAVENOUS
  Filled 2018-08-22: qty 17

## 2018-08-22 NOTE — Progress Notes (Signed)
Hematology and Oncology Follow Up Visit  Samantha Quinn [id number] Apr 29, 1927 85 y.o. 08/22/2018   Principle Diagnosis:  Stage IV adenocarcinoma of the right lung-mutation negative C6-7 left nerve root compression secondary to metastatic disease - PD-L1 (0%) /TMB of 9  Past Therapy: Status post radiation/chemotherapy for recurrence-completed October 2017 Status post right upper lobectomy in March 2013 for stage IB disease XRT to the cervical spine - started 04/02/2017  Tecentriq 1200 mg IV every 3 weeks s/p cycle 8 -- d/c progression  Current Therapy:   Alimta/Avastin s/p cycle #5 -- d/c on 05/06/2018 Zometa 4 mg IV q3 weeks -next dose on May 2020    Interim History:  Ms. Camilo is here today for follow-up.  Unfortunately, I think was started had problems with her.  Over the past several days she has not been feeling all that good.  She has been feeling tired.  She has had a poor appetite.  Her weight is down 4 pounds.  We will lab work on her today.  Her ferritin is now 5700.  I think this is an indicator of the cancer.  Something tells me that her lung cancer is aggressive and becoming more active now.  She is having no problems with cough.  She is having no fever.  We had lab done a couple days ago.  She does have low iron studies.  She has had no bleeding.  I did talk to her daughter on the cell phone.  I told her daughter what I thought was happening.  We will get her set up with a PET scan to see exactly what is going on.  Do not think this is any type of infection.  I think it would be unusual for an infection to cause a ferritin to be over 5700.   Overall, her performance status is ECOG 2.    Medications:    Allergies:  Allergies  Allergen Reactions  . Ramipril Other (See Comments)    Tongue swelling  . Tramadol Other (See Comments)    syncope syncope  . Codeine Nausea And Vomiting  . Demerol Nausea Only    Past Medical History, Surgical history, Social  history, and Family History were reviewed and updated.  Review of Systems: Review of Systems  Constitutional: Negative.   HENT: Negative.   Eyes: Negative.   Respiratory: Negative.   Cardiovascular: Negative.   Gastrointestinal: Negative.   Genitourinary: Negative.   Musculoskeletal: Positive for back pain and neck pain.  Skin: Negative.   Neurological: Negative.   Endo/Heme/Allergies: Negative.   Psychiatric/Behavioral: Negative.      Physical Exam:  weight is 115 lb (52.2 kg). Her oral temperature is 97.8 F (36.6 C). Her blood pressure is 123/73 and her pulse is 97. Her respiration is 18 and oxygen saturation is 90%.   Wt Readings from Last 3 Encounters:  08/22/18 115 lb (52.2 kg)  07/22/18 119 lb (54 kg)  06/09/18 118 lb (53.5 kg)    Physical Exam Vitals signs reviewed.  HENT:     Head: Normocephalic and atraumatic.  Eyes:     Pupils: Pupils are equal, round, and reactive to light.  Neck:     Musculoskeletal: Normal range of motion.  Cardiovascular:     Rate and Rhythm: Normal rate and regular rhythm.     Heart sounds: Normal heart sounds.  Pulmonary:     Effort: Pulmonary effort is normal.     Breath sounds: Normal breath sounds.  Abdominal:  General: Bowel sounds are normal.     Palpations: Abdomen is soft.  Musculoskeletal: Normal range of motion.        General: No tenderness or deformity.  Lymphadenopathy:     Cervical: No cervical adenopathy.  Skin:    General: Skin is warm and dry.     Findings: No erythema or rash.  Neurological:     Mental Status: She is alert and oriented to person, place, and time.  Psychiatric:        Behavior: Behavior normal.        Thought Content: Thought content normal.        Judgment: Judgment normal.      Lab Results  Component Value Date   WBC 3.1 (L) 08/19/2018   HGB 11.9 (L) 08/19/2018   HCT 35.4 (L) 08/19/2018   MCV 87.6 08/19/2018   PLT 170 08/19/2018   Lab Results  Component Value Date    FERRITIN 5,794 (H) 08/19/2018   IRON 34 (L) 08/19/2018   TIBC 246 08/19/2018   UIBC 213 08/19/2018   IRONPCTSAT 14 (L) 08/19/2018   Lab Results  Component Value Date   RETICCTPCT 2.1 05/06/2018   RBC 4.04 08/19/2018   No results found for: KPAFRELGTCHN, LAMBDASER, KAPLAMBRATIO No results found for: Kandis Cocking, IGMSERUM No results found for: Kathrynn Ducking, MSPIKE, SPEI   Chemistry      Component Value Date/Time   NA 133 (L) 08/19/2018 1310   NA 144 04/07/2017 1215   NA 135 (L) 03/11/2017 1416   K 3.7 08/19/2018 1310   K 4.0 04/07/2017 1215   K 3.9 03/11/2017 1416   CL 96 (L) 08/19/2018 1310   CL 103 04/07/2017 1215   CO2 25 08/19/2018 1310   CO2 29 04/07/2017 1215   CO2 23 03/11/2017 1416   BUN 19 08/19/2018 1310   BUN 13 04/07/2017 1215   BUN 16.1 03/11/2017 1416   CREATININE 0.86 08/19/2018 1310   CREATININE 0.8 04/07/2017 1215   CREATININE 0.9 03/11/2017 1416      Component Value Date/Time   CALCIUM 9.7 08/19/2018 1310   CALCIUM 9.6 04/07/2017 1215   CALCIUM 9.6 03/11/2017 1416   ALKPHOS 83 08/19/2018 1310   ALKPHOS 60 04/07/2017 1215   ALKPHOS 79 03/11/2017 1416   AST 27 08/19/2018 1310   AST 11 03/11/2017 1416   ALT 20 08/19/2018 1310   ALT 15 04/07/2017 1215   ALT 11 03/11/2017 1416   BILITOT 0.5 08/19/2018 1310   BILITOT 0.26 03/11/2017 1416      Impression and Plan: Ms. Mello is a very pleasant 85 yo caucasian female with progressive metastatic non small cell adenocarcinoma of the lung.  Again, I have to really worry about her lung cancer becoming more active.  We will see about the PET scan.  We will try to get a set up for a couple weeks if we can.  We will have her take IV iron today.  Maybe this will give her a little bit more energy.  I spent 35 minutes with she.  I spent time with her daughter on the phone.  This was pretty complicated.  It took a lot of coordinating and a lot of counseling.  All  the time spent face-to-face.  I will have her come back in 3 weeks.  [name], MD 4/27/20203:09 PM

## 2018-08-22 NOTE — Patient Instructions (Addendum)
Zoledronic Acid injection (Hypercalcemia, Oncology) What is this medicine? ZOLEDRONIC ACID (ZOE le dron ik AS id) lowers the amount of calcium loss from bone. It is used to treat too much calcium in your blood from cancer. It is also used to prevent complications of cancer that has spread to the bone. This medicine may be used for other purposes; ask your health care provider or pharmacist if you have questions. COMMON BRAND NAME(S): Zometa What should I tell my health care provider before I take this medicine? They need to know if you have any of these conditions: -aspirin-sensitive asthma -cancer, especially if you are receiving medicines used to treat cancer -dental disease or wear dentures -infection -kidney disease -receiving corticosteroids like dexamethasone or prednisone -an unusual or allergic reaction to zoledronic acid, other medicines, foods, dyes, or preservatives -pregnant or trying to get pregnant -breast-feeding How should I use this medicine? This medicine is for infusion into a vein. It is given by a health care professional in a hospital or clinic setting. Talk to your pediatrician regarding the use of this medicine in children. Special care may be needed. Overdosage: If you think you have taken too much of this medicine contact a poison control center or emergency room at once. NOTE: This medicine is only for you. Do not share this medicine with others. What if I miss a dose? It is important not to miss your dose. Call your doctor or health care professional if you are unable to keep an appointment. What may interact with this medicine? -certain antibiotics given by injection -NSAIDs, medicines for pain and inflammation, like ibuprofen or naproxen -some diuretics like bumetanide, furosemide -teriparatide -thalidomide This list may not describe all possible interactions. Give your health care provider a list of all the medicines, herbs, non-prescription drugs, or  dietary supplements you use. Also tell them if you smoke, drink alcohol, or use illegal drugs. Some items may interact with your medicine. What should I watch for while using this medicine? Visit your doctor or health care professional for regular checkups. It may be some time before you see the benefit from this medicine. Do not stop taking your medicine unless your doctor tells you to. Your doctor may order blood tests or other tests to see how you are doing. Women should inform their doctor if they wish to become pregnant or think they might be pregnant. There is a potential for serious side effects to an unborn child. Talk to your health care professional or pharmacist for more information. You should make sure that you get enough calcium and vitamin D while you are taking this medicine. Discuss the foods you eat and the vitamins you take with your health care professional. Some people who take this medicine have severe bone, joint, and/or muscle pain. This medicine may also increase your risk for jaw problems or a broken thigh bone. Tell your doctor right away if you have severe pain in your jaw, bones, joints, or muscles. Tell your doctor if you have any pain that does not go away or that gets worse. Tell your dentist and dental surgeon that you are taking this medicine. You should not have major dental surgery while on this medicine. See your dentist to have a dental exam and fix any dental problems before starting this medicine. Take good care of your teeth while on this medicine. Make sure you see your dentist for regular follow-up appointments. What side effects may I notice from receiving this medicine? Side effects that   you should report to your doctor or health care professional as soon as possible: -allergic reactions like skin rash, itching or hives, swelling of the face, lips, or tongue -anxiety, confusion, or depression -breathing problems -changes in vision -eye pain -feeling faint or  lightheaded, falls -jaw pain, especially after dental work -mouth sores -muscle cramps, stiffness, or weakness -redness, blistering, peeling or loosening of the skin, including inside the mouth -trouble passing urine or change in the amount of urine Side effects that usually do not require medical attention (report to your doctor or health care professional if they continue or are bothersome): -bone, joint, or muscle pain -constipation -diarrhea -fever -hair loss -irritation at site where injected -loss of appetite -nausea, vomiting -stomach upset -trouble sleeping -trouble swallowing -weak or tired This list may not describe all possible side effects. Call your doctor for medical advice about side effects. You may report side effects to FDA at 1-800-FDA-1088. Where should I keep my medicine? This drug is given in a hospital or clinic and will not be stored at home. NOTE: This sheet is a summary. It may not cover all possible information. If you have questions about this medicine, talk to your doctor, pharmacist, or health care provider.  2019 Elsevier/Gold Standard (2013-09-09 14:19:39) Ferumoxytol injection What is this medicine? FERUMOXYTOL is an iron complex. Iron is used to make healthy red blood cells, which carry oxygen and nutrients throughout the body. This medicine is used to treat iron deficiency anemia. This medicine may be used for other purposes; ask your health care provider or pharmacist if you have questions. COMMON BRAND NAME(S): Feraheme What should I tell my health care provider before I take this medicine? They need to know if you have any of these conditions: -anemia not caused by low iron levels -high levels of iron in the blood -magnetic resonance imaging (MRI) test scheduled -an unusual or allergic reaction to iron, other medicines, foods, dyes, or preservatives -pregnant or trying to get pregnant -breast-feeding How should I use this medicine? This  medicine is for injection into a vein. It is given by a health care professional in a hospital or clinic setting. Talk to your pediatrician regarding the use of this medicine in children. Special care may be needed. Overdosage: If you think you have taken too much of this medicine contact a poison control center or emergency room at once. NOTE: This medicine is only for you. Do not share this medicine with others. What if I miss a dose? It is important not to miss your dose. Call your doctor or health care professional if you are unable to keep an appointment. What may interact with this medicine? This medicine may interact with the following medications: -other iron products This list may not describe all possible interactions. Give your health care provider a list of all the medicines, herbs, non-prescription drugs, or dietary supplements you use. Also tell them if you smoke, drink alcohol, or use illegal drugs. Some items may interact with your medicine. What should I watch for while using this medicine? Visit your doctor or healthcare professional regularly. Tell your doctor or healthcare professional if your symptoms do not start to get better or if they get worse. You may need blood work done while you are taking this medicine. You may need to follow a special diet. Talk to your doctor. Foods that contain iron include: whole grains/cereals, dried fruits, beans, or peas, leafy green vegetables, and organ meats (liver, kidney). What side effects may I  notice from receiving this medicine? Side effects that you should report to your doctor or health care professional as soon as possible: -allergic reactions like skin rash, itching or hives, swelling of the face, lips, or tongue -breathing problems -changes in blood pressure -feeling faint or lightheaded, falls -fever or chills -flushing, sweating, or hot feelings -swelling of the ankles or feet Side effects that usually do not require medical  attention (report to your doctor or health care professional if they continue or are bothersome): -diarrhea -headache -nausea, vomiting -stomach pain This list may not describe all possible side effects. Call your doctor for medical advice about side effects. You may report side effects to FDA at 1-800-FDA-1088. Where should I keep my medicine? This drug is given in a hospital or clinic and will not be stored at home. NOTE: This sheet is a summary. It may not cover all possible information. If you have questions about this medicine, talk to your doctor, pharmacist, or health care provider.  2019 Elsevier/Gold Standard (2016-06-01 20:21:10) Ferumoxytol injection What is this medicine? FERUMOXYTOL is an iron complex. Iron is used to make healthy red blood cells, which carry oxygen and nutrients throughout the body. This medicine is used to treat iron deficiency anemia. This medicine may be used for other purposes; ask your health care provider or pharmacist if you have questions. COMMON BRAND NAME(S): Feraheme What should I tell my health care provider before I take this medicine? They need to know if you have any of these conditions: -anemia not caused by low iron levels -high levels of iron in the blood -magnetic resonance imaging (MRI) test scheduled -an unusual or allergic reaction to iron, other medicines, foods, dyes, or preservatives -pregnant or trying to get pregnant -breast-feeding How should I use this medicine? This medicine is for injection into a vein. It is given by a health care professional in a hospital or clinic setting. Talk to your pediatrician regarding the use of this medicine in children. Special care may be needed. Overdosage: If you think you have taken too much of this medicine contact a poison control center or emergency room at once. NOTE: This medicine is only for you. Do not share this medicine with others. What if I miss a dose? It is important not to miss  your dose. Call your doctor or health care professional if you are unable to keep an appointment. What may interact with this medicine? This medicine may interact with the following medications: -other iron products This list may not describe all possible interactions. Give your health care provider a list of all the medicines, herbs, non-prescription drugs, or dietary supplements you use. Also tell them if you smoke, drink alcohol, or use illegal drugs. Some items may interact with your medicine. What should I watch for while using this medicine? Visit your doctor or healthcare professional regularly. Tell your doctor or healthcare professional if your symptoms do not start to get better or if they get worse. You may need blood work done while you are taking this medicine. You may need to follow a special diet. Talk to your doctor. Foods that contain iron include: whole grains/cereals, dried fruits, beans, or peas, leafy green vegetables, and organ meats (liver, kidney). What side effects may I notice from receiving this medicine? Side effects that you should report to your doctor or health care professional as soon as possible: -allergic reactions like skin rash, itching or hives, swelling of the face, lips, or tongue -breathing problems -changes in  blood pressure -feeling faint or lightheaded, falls -fever or chills -flushing, sweating, or hot feelings -swelling of the ankles or feet Side effects that usually do not require medical attention (report to your doctor or health care professional if they continue or are bothersome): -diarrhea -headache -nausea, vomiting -stomach pain This list may not describe all possible side effects. Call your doctor for medical advice about side effects. You may report side effects to FDA at 1-800-FDA-1088. Where should I keep my medicine? This drug is given in a hospital or clinic and will not be stored at home. NOTE: This sheet is a summary. It may not  cover all possible information. If you have questions about this medicine, talk to your doctor, pharmacist, or health care provider.  2019 Elsevier/Gold Standard (2016-06-01 20:21:10)

## 2018-08-23 ENCOUNTER — Telehealth: Payer: Self-pay | Admitting: Hematology & Oncology

## 2018-08-23 NOTE — Telephone Encounter (Signed)
sw pt to confirm 5/15 appt at 115 pm per 4/27 LOS. Gave pt [facility] scheduling number to contact [phone] if she does not get a PET appt by the end of this week

## 2018-08-24 DIAGNOSIS — L821 Other seborrheic keratosis: Secondary | ICD-10-CM | POA: Diagnosis not present

## 2018-08-24 DIAGNOSIS — Z85828 Personal history of other malignant neoplasm of skin: Secondary | ICD-10-CM | POA: Diagnosis not present

## 2018-08-24 DIAGNOSIS — D2261 Melanocytic nevi of right upper limb, including shoulder: Secondary | ICD-10-CM | POA: Diagnosis not present

## 2018-08-24 DIAGNOSIS — D1801 Hemangioma of skin and subcutaneous tissue: Secondary | ICD-10-CM | POA: Diagnosis not present

## 2018-08-24 DIAGNOSIS — Z85820 Personal history of malignant melanoma of skin: Secondary | ICD-10-CM | POA: Diagnosis not present

## 2018-08-24 DIAGNOSIS — D692 Other nonthrombocytopenic purpura: Secondary | ICD-10-CM | POA: Diagnosis not present

## 2018-08-24 DIAGNOSIS — D485 Neoplasm of uncertain behavior of skin: Secondary | ICD-10-CM | POA: Diagnosis not present

## 2018-08-24 DIAGNOSIS — L570 Actinic keratosis: Secondary | ICD-10-CM | POA: Diagnosis not present

## 2018-08-24 DIAGNOSIS — D225 Melanocytic nevi of trunk: Secondary | ICD-10-CM | POA: Diagnosis not present

## 2018-08-25 ENCOUNTER — Ambulatory Visit: Payer: Medicare Other | Admitting: Hematology & Oncology

## 2018-08-25 ENCOUNTER — Other Ambulatory Visit: Payer: Medicare Other

## 2018-08-25 ENCOUNTER — Ambulatory Visit: Payer: Medicare Other

## 2018-08-25 ENCOUNTER — Telehealth: Payer: Self-pay | Admitting: Hematology & Oncology

## 2018-08-25 NOTE — Telephone Encounter (Signed)
Called and spoke with patient regarding appt for PET date/time/location w/instructions given

## 2018-08-26 ENCOUNTER — Ambulatory Visit: Payer: Medicare Other | Admitting: Hematology & Oncology

## 2018-08-26 ENCOUNTER — Other Ambulatory Visit: Payer: Medicare Other

## 2018-08-26 ENCOUNTER — Ambulatory Visit: Payer: Medicare Other

## 2018-08-30 ENCOUNTER — Other Ambulatory Visit: Payer: Medicare Other

## 2018-09-06 ENCOUNTER — Encounter (HOSPITAL_COMMUNITY)
Admission: RE | Admit: 2018-09-06 | Discharge: 2018-09-06 | Disposition: A | Discharge status: Home / Self Care | Payer: Medicare Other | Source: Ambulatory Visit | Attending: Hematology & Oncology | Admitting: Hematology & Oncology

## 2018-09-06 ENCOUNTER — Other Ambulatory Visit: Payer: Self-pay

## 2018-09-06 DIAGNOSIS — C7951 Secondary malignant neoplasm of bone: Secondary | ICD-10-CM | POA: Diagnosis not present

## 2018-09-06 DIAGNOSIS — C3431 Malignant neoplasm of lower lobe, right bronchus or lung: Secondary | ICD-10-CM

## 2018-09-06 DIAGNOSIS — C3490 Malignant neoplasm of unspecified part of unspecified bronchus or lung: Secondary | ICD-10-CM | POA: Diagnosis not present

## 2018-09-06 LAB — GLUCOSE, CAPILLARY: Glucose-Capillary: 92 mg/dL (ref 70–99)

## 2018-09-06 MED ORDER — FLUDEOXYGLUCOSE F - 18 (FDG) INJECTION
6.5000 | Freq: Once | INTRAVENOUS | Status: AC | PRN
  Administered 2018-09-06: 6.5 via INTRAVENOUS

## 2018-09-09 ENCOUNTER — Encounter: Payer: Self-pay | Admitting: Hematology & Oncology

## 2018-09-09 ENCOUNTER — Other Ambulatory Visit: Payer: Self-pay

## 2018-09-09 ENCOUNTER — Inpatient Hospital Stay: Payer: Medicare Other | Attending: Hematology & Oncology | Admitting: Hematology & Oncology

## 2018-09-09 ENCOUNTER — Inpatient Hospital Stay: Payer: Medicare Other

## 2018-09-09 VITALS — BP 134/58 | HR 74 | Temp 97.6°F | Resp 18 | Wt 112.0 lb

## 2018-09-09 DIAGNOSIS — Z9221 Personal history of antineoplastic chemotherapy: Secondary | ICD-10-CM | POA: Diagnosis not present

## 2018-09-09 DIAGNOSIS — Z923 Personal history of irradiation: Secondary | ICD-10-CM

## 2018-09-09 DIAGNOSIS — C3431 Malignant neoplasm of lower lobe, right bronchus or lung: Secondary | ICD-10-CM | POA: Diagnosis not present

## 2018-09-09 DIAGNOSIS — C7951 Secondary malignant neoplasm of bone: Secondary | ICD-10-CM | POA: Insufficient documentation

## 2018-09-09 DIAGNOSIS — R42 Dizziness and giddiness: Secondary | ICD-10-CM | POA: Insufficient documentation

## 2018-09-09 DIAGNOSIS — M542 Cervicalgia: Secondary | ICD-10-CM | POA: Insufficient documentation

## 2018-09-09 DIAGNOSIS — R918 Other nonspecific abnormal finding of lung field: Secondary | ICD-10-CM | POA: Diagnosis not present

## 2018-09-09 DIAGNOSIS — M549 Dorsalgia, unspecified: Secondary | ICD-10-CM | POA: Diagnosis not present

## 2018-09-09 LAB — CMP (CANCER CENTER ONLY)
ALT: 9 U/L (ref 0–44)
AST: 14 U/L — ABNORMAL LOW (ref 15–41)
Albumin: 4.3 g/dL (ref 3.5–5.0)
Alkaline Phosphatase: 93 U/L (ref 38–126)
Anion gap: 10 (ref 5–15)
BUN: 16 mg/dL (ref 8–23)
CO2: 26 mmol/L (ref 22–32)
Calcium: 9.4 mg/dL (ref 8.9–10.3)
Chloride: 100 mmol/L (ref 98–111)
Creatinine: 0.66 mg/dL (ref 0.44–1.00)
GFR, Est AFR Am: >60 mL/min (ref >60)
GFR, Estimated: >60 mL/min (ref >60)
Glucose, Bld: 127 mg/dL — ABNORMAL HIGH (ref 70–99)
Potassium: 3.8 mmol/L (ref 3.5–5.1)
Sodium: 136 mmol/L (ref 135–145)
Total Bilirubin: 0.4 mg/dL (ref 0.3–1.2)
Total Protein: 7 g/dL (ref 6.5–8.1)

## 2018-09-09 LAB — CBC WITH DIFFERENTIAL (CANCER CENTER ONLY)
Abs Immature Granulocytes: 0.03 10*3/uL (ref 0.00–0.07)
Basophils Absolute: 0 10*3/uL (ref 0.0–0.1)
Basophils Relative: 0 %
Eosinophils Absolute: 0 10*3/uL (ref 0.0–0.5)
Eosinophils Relative: 0 %
HCT: 37.4 % (ref 36.0–46.0)
Hemoglobin: 12 g/dL (ref 12.0–15.0)
Immature Granulocytes: 1 %
Lymphocytes Relative: 10 %
Lymphs Abs: 0.6 10*3/uL — ABNORMAL LOW (ref 0.7–4.0)
MCH: 29.5 pg (ref 26.0–34.0)
MCHC: 32.1 g/dL (ref 30.0–36.0)
MCV: 91.9 fL (ref 80.0–100.0)
Monocytes Absolute: 0.3 10*3/uL (ref 0.1–1.0)
Monocytes Relative: 5 %
Neutro Abs: 5.1 10*3/uL (ref 1.7–7.7)
Neutrophils Relative %: 84 %
Platelet Count: 187 10*3/uL (ref 150–400)
RBC: 4.07 MIL/uL (ref 3.87–5.11)
RDW: 16 % — ABNORMAL HIGH (ref 11.5–15.5)
WBC Count: 6 10*3/uL (ref 4.0–10.5)
nRBC: 0 % (ref 0.0–0.2)

## 2018-09-09 NOTE — Progress Notes (Signed)
Hematology and Oncology Follow Up Visit  Samantha Quinn [id number] Dec 20, 1927 85 y.o. 09/09/2018   Principle Diagnosis:  Stage IV adenocarcinoma of the right lung-mutation negative C6-7 left nerve root compression secondary to metastatic disease - PD-L1 (0%) /TMB of 9  Past Therapy: Status post radiation/chemotherapy for recurrence-completed October 2017 Status post right upper lobectomy in March 2013 for stage IB disease XRT to the cervical spine - started 04/02/2017  Tecentriq 1200 mg IV every 3 weeks s/p cycle 8 -- d/c progression  Current Therapy:   Alimta/Avastin s/p cycle #5 -- d/c on 05/06/2018 Zometa 4 mg IV q4 weeks -next dose on June 2020    Interim History:  Samantha Quinn is here today for follow-up.  She actually looks a lot better.  She feels better.  She does have a little bit of vertigo which she has had previously.  We did do a PET scan on him.  The PET scan thankfully really did not show too much change in her disease.  There was no new areas of cancer.  She did have some slightly increased size of the cell centimeter pulmonary nodules.  There were not hypermetabolic on PET scan.  She did have some increased activity on a couple bone lesions.  There was a left scapula lesion.  Was a left inferior pubic ramus lesion.  I really think that she has stable disease for the most part.  Her quality of life is doing better.  I just do not see that treating her with chemotherapy is going to enhance her quality of life.  It will be interesting to see what her ferritin level is.  Her appetite is doing better.  She is having no problems with nausea or vomiting.  She is having no rashes.  There is no fever.    Overall, her performance status is ECOG 2.    Medications:    Allergies:  Allergies  Allergen Reactions  . Ramipril Other (See Comments)    Tongue swelling  . Tramadol Other (See Comments)    syncope syncope  . Codeine Nausea And Vomiting  . Demerol Nausea Only     Past Medical History, Surgical history, Social history, and Family History were reviewed and updated.  Review of Systems: Review of Systems  Constitutional: Negative.   HENT: Negative.   Eyes: Negative.   Respiratory: Negative.   Cardiovascular: Negative.   Gastrointestinal: Negative.   Genitourinary: Negative.   Musculoskeletal: Positive for back pain and neck pain.  Skin: Negative.   Neurological: Negative.   Endo/Heme/Allergies: Negative.   Psychiatric/Behavioral: Negative.      Physical Exam:  weight is 112 lb (50.8 kg). Her oral temperature is 97.6 F (36.4 C). Her blood pressure is 134/58 (abnormal) and her pulse is 74. Her respiration is 18 and oxygen saturation is 100%.   Wt Readings from Last 3 Encounters:  09/09/18 112 lb (50.8 kg)  08/22/18 115 lb (52.2 kg)  07/22/18 119 lb (54 kg)    Physical Exam Vitals signs reviewed.  HENT:     Head: Normocephalic and atraumatic.  Eyes:     Pupils: Pupils are equal, round, and reactive to light.  Neck:     Musculoskeletal: Normal range of motion.  Cardiovascular:     Rate and Rhythm: Normal rate and regular rhythm.     Heart sounds: Normal heart sounds.  Pulmonary:     Effort: Pulmonary effort is normal.     Breath sounds: Normal breath sounds.  Abdominal:  General: Bowel sounds are normal.     Palpations: Abdomen is soft.  Musculoskeletal: Normal range of motion.        General: No tenderness or deformity.  Lymphadenopathy:     Cervical: No cervical adenopathy.  Skin:    General: Skin is warm and dry.     Findings: No erythema or rash.  Neurological:     Mental Status: She is alert and oriented to person, place, and time.  Psychiatric:        Behavior: Behavior normal.        Thought Content: Thought content normal.        Judgment: Judgment normal.      Lab Results  Component Value Date   WBC 6.0 09/09/2018   HGB 12.0 09/09/2018   HCT 37.4 09/09/2018   MCV 91.9 09/09/2018   PLT 187  09/09/2018   Lab Results  Component Value Date   FERRITIN 5,794 (H) 08/19/2018   IRON 34 (L) 08/19/2018   TIBC 246 08/19/2018   UIBC 213 08/19/2018   IRONPCTSAT 14 (L) 08/19/2018   Lab Results  Component Value Date   RETICCTPCT 2.1 05/06/2018   RBC 4.07 09/09/2018   No results found for: KPAFRELGTCHN, LAMBDASER, KAPLAMBRATIO No results found for: Kandis Cocking, IGMSERUM No results found for: Kathrynn Ducking, MSPIKE, SPEI   Chemistry      Component Value Date/Time   NA 133 (L) 08/19/2018 1310   NA 144 04/07/2017 1215   NA 135 (L) 03/11/2017 1416   K 3.7 08/19/2018 1310   K 4.0 04/07/2017 1215   K 3.9 03/11/2017 1416   CL 96 (L) 08/19/2018 1310   CL 103 04/07/2017 1215   CO2 25 08/19/2018 1310   CO2 29 04/07/2017 1215   CO2 23 03/11/2017 1416   BUN 19 08/19/2018 1310   BUN 13 04/07/2017 1215   BUN 16.1 03/11/2017 1416   CREATININE 0.86 08/19/2018 1310   CREATININE 0.8 04/07/2017 1215   CREATININE 0.9 03/11/2017 1416      Component Value Date/Time   CALCIUM 9.7 08/19/2018 1310   CALCIUM 9.6 04/07/2017 1215   CALCIUM 9.6 03/11/2017 1416   ALKPHOS 83 08/19/2018 1310   ALKPHOS 60 04/07/2017 1215   ALKPHOS 79 03/11/2017 1416   AST 27 08/19/2018 1310   AST 11 03/11/2017 1416   ALT 20 08/19/2018 1310   ALT 15 04/07/2017 1215   ALT 11 03/11/2017 1416   BILITOT 0.5 08/19/2018 1310   BILITOT 0.26 03/11/2017 1416      Impression and Plan: Samantha Quinn is a very pleasant 85 yo caucasian female with progressive metastatic non small cell adenocarcinoma of the lung.  I am thankful that she is doing as well as she is doing.  I really believe that we can watch her right now.  We will just use Zometa on her.  I would like to get her back in 3 weeks.  I do not see that we need to do another PET scan until August.    [name], MD 5/15/20202:02 PM

## 2018-09-12 LAB — IRON AND TIBC
Iron: 102 ug/dL (ref 41–142)
Saturation Ratios: 37 % (ref 21–57)
TIBC: 276 ug/dL (ref 236–444)
UIBC: 174 ug/dL (ref 120–384)

## 2018-09-12 LAB — LACTATE DEHYDROGENASE: LDH: 220 U/L — ABNORMAL HIGH (ref 98–192)

## 2018-09-12 LAB — FERRITIN: Ferritin: 1650 ng/mL — ABNORMAL HIGH (ref 11–307)

## 2018-09-13 ENCOUNTER — Telehealth: Payer: Self-pay | Admitting: *Deleted

## 2018-09-13 NOTE — Telephone Encounter (Signed)
Pt.'s daughter Samantha Quinn notified that the ferritin went from 5800 down to 1650 and that this is a very good sign that her cancer really is not too active.  Pt.'s daughter appreciative of call and has no questions or concerns at this time.

## 2018-09-13 NOTE — Telephone Encounter (Signed)
-----   Message from [name], MD sent at 09/13/2018  2:00 PM EDT ----- Call her dgtr - Beth -- the ferritin went from 5800 down to 1650!!!  This is a vry good sign that her cancer really is not too active.  pete

## 2018-09-21 DIAGNOSIS — Z85820 Personal history of malignant melanoma of skin: Secondary | ICD-10-CM | POA: Diagnosis not present

## 2018-09-21 DIAGNOSIS — Z85828 Personal history of other malignant neoplasm of skin: Secondary | ICD-10-CM | POA: Diagnosis not present

## 2018-09-21 DIAGNOSIS — L570 Actinic keratosis: Secondary | ICD-10-CM | POA: Diagnosis not present

## 2018-09-29 ENCOUNTER — Other Ambulatory Visit: Payer: Self-pay | Admitting: Internal Medicine

## 2018-09-30 ENCOUNTER — Inpatient Hospital Stay: Payer: Medicare Other

## 2018-09-30 ENCOUNTER — Other Ambulatory Visit: Payer: Self-pay

## 2018-09-30 ENCOUNTER — Inpatient Hospital Stay: Payer: Medicare Other | Attending: Hematology & Oncology | Admitting: Hematology & Oncology

## 2018-09-30 ENCOUNTER — Encounter: Payer: Self-pay | Admitting: Hematology & Oncology

## 2018-09-30 VITALS — BP 143/62 | HR 71 | Temp 97.7°F | Resp 17 | Wt 115.0 lb

## 2018-09-30 DIAGNOSIS — R42 Dizziness and giddiness: Secondary | ICD-10-CM | POA: Insufficient documentation

## 2018-09-30 DIAGNOSIS — D508 Other iron deficiency anemias: Secondary | ICD-10-CM

## 2018-09-30 DIAGNOSIS — C7951 Secondary malignant neoplasm of bone: Secondary | ICD-10-CM | POA: Insufficient documentation

## 2018-09-30 DIAGNOSIS — C3431 Malignant neoplasm of lower lobe, right bronchus or lung: Secondary | ICD-10-CM | POA: Diagnosis not present

## 2018-09-30 DIAGNOSIS — Z923 Personal history of irradiation: Secondary | ICD-10-CM | POA: Diagnosis not present

## 2018-09-30 DIAGNOSIS — Z9221 Personal history of antineoplastic chemotherapy: Secondary | ICD-10-CM | POA: Insufficient documentation

## 2018-09-30 DIAGNOSIS — M549 Dorsalgia, unspecified: Secondary | ICD-10-CM | POA: Diagnosis not present

## 2018-09-30 DIAGNOSIS — M542 Cervicalgia: Secondary | ICD-10-CM | POA: Diagnosis not present

## 2018-09-30 LAB — CBC WITH DIFFERENTIAL (CANCER CENTER ONLY)
Abs Immature Granulocytes: 0.07 10*3/uL (ref 0.00–0.07)
Basophils Absolute: 0 10*3/uL (ref 0.0–0.1)
Basophils Relative: 0 %
Eosinophils Absolute: 0.1 10*3/uL (ref 0.0–0.5)
Eosinophils Relative: 1 %
HCT: 36.8 % (ref 36.0–46.0)
Hemoglobin: 11.6 g/dL — ABNORMAL LOW (ref 12.0–15.0)
Immature Granulocytes: 1 %
Lymphocytes Relative: 9 %
Lymphs Abs: 0.6 10*3/uL — ABNORMAL LOW (ref 0.7–4.0)
MCH: 29.8 pg (ref 26.0–34.0)
MCHC: 31.5 g/dL (ref 30.0–36.0)
MCV: 94.6 fL (ref 80.0–100.0)
Monocytes Absolute: 0.5 10*3/uL (ref 0.1–1.0)
Monocytes Relative: 7 %
Neutro Abs: 5.6 10*3/uL (ref 1.7–7.7)
Neutrophils Relative %: 82 %
Platelet Count: 208 10*3/uL (ref 150–400)
RBC: 3.89 MIL/uL (ref 3.87–5.11)
RDW: 15.6 % — ABNORMAL HIGH (ref 11.5–15.5)
WBC Count: 6.8 10*3/uL (ref 4.0–10.5)
nRBC: 0 % (ref 0.0–0.2)

## 2018-09-30 LAB — CMP (CANCER CENTER ONLY)
ALT: 8 U/L (ref 0–44)
AST: 14 U/L — ABNORMAL LOW (ref 15–41)
Albumin: 4.4 g/dL (ref 3.5–5.0)
Alkaline Phosphatase: 114 U/L (ref 38–126)
Anion gap: 11 (ref 5–15)
BUN: 19 mg/dL (ref 8–23)
CO2: 27 mmol/L (ref 22–32)
Calcium: 9.4 mg/dL (ref 8.9–10.3)
Chloride: 102 mmol/L (ref 98–111)
Creatinine: 0.66 mg/dL (ref 0.44–1.00)
GFR, Est AFR Am: >60 mL/min (ref >60)
GFR, Estimated: >60 mL/min (ref >60)
Glucose, Bld: 111 mg/dL — ABNORMAL HIGH (ref 70–99)
Potassium: 4.3 mmol/L (ref 3.5–5.1)
Sodium: 140 mmol/L (ref 135–145)
Total Bilirubin: 0.4 mg/dL (ref 0.3–1.2)
Total Protein: 7.2 g/dL (ref 6.5–8.1)

## 2018-09-30 MED ORDER — ZOLEDRONIC ACID 4 MG/5ML IV CONC
3.0000 mg | Freq: Once | INTRAVENOUS | Status: AC
  Administered 2018-09-30: 15:00:00 3 mg via INTRAVENOUS
  Filled 2018-09-30: qty 3.75

## 2018-09-30 MED ORDER — SODIUM CHLORIDE 0.9 % IV SOLN
INTRAVENOUS | Status: DC
  Administered 2018-09-30: 14:00:00 via INTRAVENOUS
  Filled 2018-09-30: qty 250

## 2018-09-30 NOTE — Patient Instructions (Signed)

## 2018-09-30 NOTE — Progress Notes (Signed)
Hematology and Oncology Follow Up Visit  Samantha Quinn [id number] [birth year] 85 y.o. 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  She actually looks a lot better.  She feels better.  She does have a little bit of vertigo which she has had previously.  I am so happy that her quality of life seems to be doing much better right now.  I am not sure as to why that is but I know that she is getting a lot of great care by her family.    She is not complaining of pain.  She is not having any problems with bowels or bladder.  She had no leg swelling.  She has had no rashes.    I think what is incredibly important is that her ferritin keeps coming down.  It is now 843.  I think this is a very good indicator as to the fact that her cancer probably is not all that active right now.  She has had no headache.  She does have some neck pain and stiffness.  I suspect this probably is from her past surgery and radiation that she has had.  Overall, her performance  status is ECOG 2.    Medications:    Allergies:  Allergies  Allergen Reactions  . Ramipril Other (See Comments)    Tongue swelling  . Tramadol Other (See Comments)    syncope syncope  . Codeine Nausea And Vomiting  . Demerol Nausea Only    Past Medical History, Surgical history, Social history, and Family History were reviewed and updated.  Review  of Systems: Review of Systems  Constitutional: Negative.   HENT: Negative.   Eyes: Negative.   Respiratory: Negative.   Cardiovascular: Negative.   Gastrointestinal: Negative.   Genitourinary: Negative.   Musculoskeletal: Positive for back pain and neck pain.  Skin: Negative.   Neurological: Negative.   Endo/Heme/Allergies: Negative.   Psychiatric/Behavioral: Negative.      Physical Exam:  weight is 115 lb (52.2 kg). Her oral temperature is 97.7 F (36.5 C). Her blood pressure is 143/62 (abnormal) and her pulse is 71. Her respiration is 17 and oxygen saturation is 99%.   Wt Readings from Last 3 Encounters:  09/30/18 115 lb (52.2 kg)  09/09/18 112 lb (50.8 kg)  08/22/18 115 lb (52.2 kg)    Physical Exam Vitals signs reviewed.  HENT:     Head: Normocephalic and atraumatic.  Eyes:     Pupils: Pupils are equal, round, and reactive to light.  Neck:     Musculoskeletal: Normal range of motion.  Cardiovascular:     Rate and Rhythm: Normal rate and regular rhythm.     Heart sounds: Normal heart sounds.  Pulmonary:     Effort: Pulmonary effort is normal.     Breath sounds: Normal breath sounds.  Abdominal:     General: Bowel sounds are normal.  Palpations: Abdomen is soft.  Musculoskeletal: Normal range of motion.        General: No tenderness or deformity.  Lymphadenopathy:     Cervical: No cervical adenopathy.  Skin:    General: Skin is warm and dry.     Findings: No erythema or rash.  Neurological:     Mental Status: She is alert and oriented to person, place, and time.  Psychiatric:        Behavior: Behavior normal.        Thought Content: Thought content normal.        Judgment: Judgment normal.      Lab Results  Component Value Date   WBC 6.8 09/30/2018   HGB 11.6 (L) 09/30/2018   HCT 36.8 09/30/2018   MCV 94.6 09/30/2018   PLT 208 09/30/2018   Lab Results  Component Value Date   FERRITIN 1,650 (H) 09/09/2018   IRON 102 09/09/2018   TIBC 276  09/09/2018   UIBC 174 09/09/2018   IRONPCTSAT 37 09/09/2018   Lab Results  Component Value Date   RETICCTPCT 2.1 05/06/2018   RBC 3.89 09/30/2018   No results found for: KPAFRELGTCHN, LAMBDASER, KAPLAMBRATIO No results found for: Kandis Cocking, IGMSERUM No results found for: Odetta Pink, SPEI   Chemistry      Component Value Date/Time   NA 140 09/30/2018 1140   NA 144 04/07/2017 1215   NA 135 (L) 03/11/2017 1416   K 4.3 09/30/2018 1140   K 4.0 04/07/2017 1215   K 3.9 03/11/2017 1416   CL 102 09/30/2018 1140   CL 103 04/07/2017 1215   CO2 27 09/30/2018 1140   CO2 29 04/07/2017 1215   CO2 23 03/11/2017 1416   BUN 19 09/30/2018 1140   BUN 13 04/07/2017 1215   BUN 16.1 03/11/2017 1416   CREATININE 0.66 09/30/2018 1140   CREATININE 0.8 04/07/2017 1215   CREATININE 0.9 03/11/2017 1416      Component Value Date/Time   CALCIUM 9.4 09/30/2018 1140   CALCIUM 9.6 04/07/2017 1215   CALCIUM 9.6 03/11/2017 1416   ALKPHOS 114 09/30/2018 1140   ALKPHOS 60 04/07/2017 1215   ALKPHOS 79 03/11/2017 1416   AST 14 (L) 09/30/2018 1140   AST 11 03/11/2017 1416   ALT 8 09/30/2018 1140   ALT 15 04/07/2017 1215   ALT 11 03/11/2017 1416   BILITOT 0.4 09/30/2018 1140   BILITOT 0.26 03/11/2017 1416      Impression and Plan: Samantha Quinn is a very pleasant 85 yo caucasian female with progressive metastatic non small cell adenocarcinoma of the lung.  I am thankful that she is doing as well as she is doing.  I really believe that we can watch her right now.  We will just use Zometa on her.  I do not think we have to do a another PET scan on her probably until August.  We will see her back in another month.  We will do the Zometa on her.  She is tolerating Zometa quite well.  Again, her quality of life is so much better and is happy that she is improving.    [name], MD 6/5/20201:47 PM

## 2018-10-03 LAB — IRON AND TIBC
Iron: 87 ug/dL (ref 41–142)
Saturation Ratios: 30 % (ref 21–57)
TIBC: 288 ug/dL (ref 236–444)
UIBC: 200 ug/dL (ref 120–384)

## 2018-10-03 LAB — FERRITIN: Ferritin: 843 ng/mL — ABNORMAL HIGH (ref 11–307)

## 2018-10-04 ENCOUNTER — Ambulatory Visit: Payer: Medicare Other | Admitting: Internal Medicine

## 2018-10-10 ENCOUNTER — Encounter (INDEPENDENT_AMBULATORY_CARE_PROVIDER_SITE_OTHER): Payer: Medicare Other | Admitting: Ophthalmology

## 2018-10-27 ENCOUNTER — Inpatient Hospital Stay: Payer: Medicare Other

## 2018-10-27 ENCOUNTER — Encounter: Payer: Self-pay | Admitting: Hematology & Oncology

## 2018-10-27 ENCOUNTER — Inpatient Hospital Stay: Payer: Medicare Other | Attending: Hematology & Oncology

## 2018-10-27 ENCOUNTER — Other Ambulatory Visit: Payer: Self-pay

## 2018-10-27 ENCOUNTER — Inpatient Hospital Stay (HOSPITAL_BASED_OUTPATIENT_CLINIC_OR_DEPARTMENT_OTHER): Payer: Medicare Other | Admitting: Hematology & Oncology

## 2018-10-27 VITALS — BP 137/65 | HR 71 | Temp 97.5°F | Resp 18 | Wt 116.0 lb

## 2018-10-27 DIAGNOSIS — M542 Cervicalgia: Secondary | ICD-10-CM

## 2018-10-27 DIAGNOSIS — R531 Weakness: Secondary | ICD-10-CM | POA: Insufficient documentation

## 2018-10-27 DIAGNOSIS — C3431 Malignant neoplasm of lower lobe, right bronchus or lung: Secondary | ICD-10-CM | POA: Diagnosis not present

## 2018-10-27 DIAGNOSIS — Z9221 Personal history of antineoplastic chemotherapy: Secondary | ICD-10-CM

## 2018-10-27 DIAGNOSIS — Z923 Personal history of irradiation: Secondary | ICD-10-CM | POA: Diagnosis not present

## 2018-10-27 DIAGNOSIS — D508 Other iron deficiency anemias: Secondary | ICD-10-CM

## 2018-10-27 DIAGNOSIS — M545 Low back pain: Secondary | ICD-10-CM

## 2018-10-27 DIAGNOSIS — C7951 Secondary malignant neoplasm of bone: Secondary | ICD-10-CM | POA: Insufficient documentation

## 2018-10-27 DIAGNOSIS — R55 Syncope and collapse: Secondary | ICD-10-CM | POA: Insufficient documentation

## 2018-10-27 LAB — CMP (CANCER CENTER ONLY)
ALT: 7 U/L (ref 0–44)
AST: 14 U/L — ABNORMAL LOW (ref 15–41)
Albumin: 4.2 g/dL (ref 3.5–5.0)
Alkaline Phosphatase: 130 U/L — ABNORMAL HIGH (ref 38–126)
Anion gap: 10 (ref 5–15)
BUN: 17 mg/dL (ref 8–23)
CO2: 28 mmol/L (ref 22–32)
Calcium: 9.8 mg/dL (ref 8.9–10.3)
Chloride: 100 mmol/L (ref 98–111)
Creatinine: 0.62 mg/dL (ref 0.44–1.00)
GFR, Est AFR Am: >60 mL/min (ref >60)
GFR, Estimated: >60 mL/min (ref >60)
Glucose, Bld: 101 mg/dL — ABNORMAL HIGH (ref 70–99)
Potassium: 4.4 mmol/L (ref 3.5–5.1)
Sodium: 138 mmol/L (ref 135–145)
Total Bilirubin: 0.3 mg/dL (ref 0.3–1.2)
Total Protein: 6.8 g/dL (ref 6.5–8.1)

## 2018-10-27 LAB — CBC WITH DIFFERENTIAL (CANCER CENTER ONLY)
Abs Immature Granulocytes: 0.05 10*3/uL (ref 0.00–0.07)
Basophils Absolute: 0 10*3/uL (ref 0.0–0.1)
Basophils Relative: 0 %
Eosinophils Absolute: 0 10*3/uL (ref 0.0–0.5)
Eosinophils Relative: 1 %
HCT: 35.1 % — ABNORMAL LOW (ref 36.0–46.0)
Hemoglobin: 11.5 g/dL — ABNORMAL LOW (ref 12.0–15.0)
Immature Granulocytes: 1 %
Lymphocytes Relative: 6 %
Lymphs Abs: 0.4 10*3/uL — ABNORMAL LOW (ref 0.7–4.0)
MCH: 30.7 pg (ref 26.0–34.0)
MCHC: 32.8 g/dL (ref 30.0–36.0)
MCV: 93.6 fL (ref 80.0–100.0)
Monocytes Absolute: 0.5 10*3/uL (ref 0.1–1.0)
Monocytes Relative: 7 %
Neutro Abs: 5.3 10*3/uL (ref 1.7–7.7)
Neutrophils Relative %: 85 %
Platelet Count: 203 10*3/uL (ref 150–400)
RBC: 3.75 MIL/uL — ABNORMAL LOW (ref 3.87–5.11)
RDW: 14.4 % (ref 11.5–15.5)
WBC Count: 6.2 10*3/uL (ref 4.0–10.5)
nRBC: 0 % (ref 0.0–0.2)

## 2018-10-27 MED ORDER — ZOLEDRONIC ACID 4 MG/5ML IV CONC
3.0000 mg | Freq: Once | INTRAVENOUS | Status: AC
  Administered 2018-10-27: 13:00:00 3 mg via INTRAVENOUS
  Filled 2018-10-27: qty 3.75

## 2018-10-27 MED ORDER — SODIUM CHLORIDE 0.9 % IV SOLN
INTRAVENOUS | Status: DC
  Administered 2018-10-27: 13:00:00 via INTRAVENOUS
  Filled 2018-10-27: qty 250

## 2018-10-27 NOTE — Patient Instructions (Signed)
Zoledronic Acid injection (Hypercalcemia, Oncology) (Zometa) What is this medicine? ZOLEDRONIC ACID (ZOE le dron ik AS id) lowers the amount of calcium loss from bone. It is used to treat too much calcium in your blood from cancer. It is also used to prevent complications of cancer that has spread to the bone. This medicine may be used for other purposes; ask your health care provider or pharmacist if you have questions. COMMON BRAND NAME(S): Zometa What should I tell my health care provider before I take this medicine? They need to know if you have any of these conditions:  aspirin-sensitive asthma  cancer, especially if you are receiving medicines used to treat cancer  dental disease or wear dentures  infection  kidney disease  receiving corticosteroids like dexamethasone or prednisone  an unusual or allergic reaction to zoledronic acid, other medicines, foods, dyes, or preservatives  pregnant or trying to get pregnant  breast-feeding How should I use this medicine? This medicine is for infusion into a vein. It is given by a health care professional in a hospital or clinic setting. Talk to your pediatrician regarding the use of this medicine in children. Special care may be needed. Overdosage: If you think you have taken too much of this medicine contact a poison control center or emergency room at once. NOTE: This medicine is only for you. Do not share this medicine with others. What if I miss a dose? It is important not to miss your dose. Call your doctor or health care professional if you are unable to keep an appointment. What may interact with this medicine?  certain antibiotics given by injection  NSAIDs, medicines for pain and inflammation, like ibuprofen or naproxen  some diuretics like bumetanide, furosemide  teriparatide  thalidomide This list may not describe all possible interactions. Give your health care provider a list of all the medicines, herbs,  non-prescription drugs, or dietary supplements you use. Also tell them if you smoke, drink alcohol, or use illegal drugs. Some items may interact with your medicine. What should I watch for while using this medicine? Visit your doctor or health care professional for regular checkups. It may be some time before you see the benefit from this medicine. Do not stop taking your medicine unless your doctor tells you to. Your doctor may order blood tests or other tests to see how you are doing. Women should inform their doctor if they wish to become pregnant or think they might be pregnant. There is a potential for serious side effects to an unborn child. Talk to your health care professional or pharmacist for more information. You should make sure that you get enough calcium and vitamin D while you are taking this medicine. Discuss the foods you eat and the vitamins you take with your health care professional. Some people who take this medicine have severe bone, joint, and/or muscle pain. This medicine may also increase your risk for jaw problems or a broken thigh bone. Tell your doctor right away if you have severe pain in your jaw, bones, joints, or muscles. Tell your doctor if you have any pain that does not go away or that gets worse. Tell your dentist and dental surgeon that you are taking this medicine. You should not have major dental surgery while on this medicine. See your dentist to have a dental exam and fix any dental problems before starting this medicine. Take good care of your teeth while on this medicine. Make sure you see your dentist for regular  follow-up appointments. What side effects may I notice from receiving this medicine? Side effects that you should report to your doctor or health care professional as soon as possible:  allergic reactions like skin rash, itching or hives, swelling of the face, lips, or tongue  anxiety, confusion, or depression  breathing problems  changes in  vision  eye pain  feeling faint or lightheaded, falls  jaw pain, especially after dental work  mouth sores  muscle cramps, stiffness, or weakness  redness, blistering, peeling or loosening of the skin, including inside the mouth  trouble passing urine or change in the amount of urine Side effects that usually do not require medical attention (report to your doctor or health care professional if they continue or are bothersome):  bone, joint, or muscle pain  constipation  diarrhea  fever  hair loss  irritation at site where injected  loss of appetite  nausea, vomiting  stomach upset  trouble sleeping  trouble swallowing  weak or tired This list may not describe all possible side effects. Call your doctor for medical advice about side effects. You may report side effects to FDA at 1-800-FDA-1088. Where should I keep my medicine? This drug is given in a hospital or clinic and will not be stored at home. NOTE: This sheet is a summary. It may not cover all possible information. If you have questions about this medicine, talk to your doctor, pharmacist, or health care provider.  2020 [employer]vier/Gold Standard (2013-09-09 14:19:39)

## 2018-10-27 NOTE — Progress Notes (Signed)
Hematology and Oncology Follow Up Visit  LATANGA NEDROW [id number] July 29, 1927 85 y.o. 10/27/2018   Principle Diagnosis:  Stage IV adenocarcinoma of the right lung-mutation negative C6-7 left nerve root compression secondary to metastatic disease - PD-L1 (0%) /TMB of 9  Past Therapy: Status post radiation/chemotherapy for recurrence-completed October 2017 Status post right upper lobectomy in March 2013 for stage IB disease XRT to the cervical spine - started 04/02/2017  Tecentriq 1200 mg IV every 3 weeks s/p cycle 8 -- d/c progression  Current Therapy:   Alimta/Avastin s/p cycle #5 -- d/c on 05/06/2018 Zometa 4 mg IV q4 weeks -next dose on June 2020    Interim History:  Ms. Stys is here today for follow-up.  She is doing pretty well.  Her back seems to bother her the most.  This is the lower back.  I know that she has had radiation to the lower back.  She is able to walk.  She just cannot walk all that far because of the discomfort.  There is no issues with weakness.  There is no pain radiation.  She also has some problems with her shoulders.  She has had neck surgery.  She had radiation to the cervical spine.  Overall, I think that her cancer probably is progressing slowly.  Her weight is holding stable.  She has had decent appetite.  She has had no leg swelling.  She has had no fever.  She has had no bleeding.  Noted alkaline phosphatase is going up.  This might be an indicator of more active bone disease.  Overall, her performance  status is ECOG 2.    Medications:    Allergies:  Allergies  Allergen Reactions  . Ramipril Other (See Comments)    Tongue swelling  . Tramadol Other (See Comments)    syncope syncope  . Codeine Nausea And Vomiting  . Demerol Nausea Only    Past Medical History, Surgical history, Social history, and Family History were reviewed and updated.  Review of Systems: Review of Systems  Constitutional: Negative.   HENT: Negative.   Eyes:  Negative.   Respiratory: Negative.   Cardiovascular: Negative.   Gastrointestinal: Negative.   Genitourinary: Negative.   Musculoskeletal: Positive for back pain and neck pain.  Skin: Negative.   Neurological: Negative.   Endo/Heme/Allergies: Negative.   Psychiatric/Behavioral: Negative.      Physical Exam:  weight is 116 lb (52.6 kg). Her oral temperature is 97.5 F (36.4 C) (abnormal). Her blood pressure is 137/65 and her pulse is 71. Her respiration is 18 and oxygen saturation is 100%.   Wt Readings from Last 3 Encounters:  10/27/18 116 lb (52.6 kg)  09/30/18 115 lb (52.2 kg)  09/09/18 112 lb (50.8 kg)    Physical Exam Vitals signs reviewed.  HENT:     Head: Normocephalic and atraumatic.  Eyes:     Pupils: Pupils are equal, round, and reactive to light.  Neck:     Musculoskeletal: Normal range of motion.  Cardiovascular:     Rate and Rhythm: Normal rate and regular rhythm.     Heart sounds: Normal heart sounds.  Pulmonary:     Effort: Pulmonary effort is normal.     Breath sounds: Normal breath sounds.  Abdominal:     General: Bowel sounds are normal.     Palpations: Abdomen is soft.  Musculoskeletal: Normal range of motion.        General: No tenderness or deformity.  Lymphadenopathy:  Cervical: No cervical adenopathy.  Skin:    General: Skin is warm and dry.     Findings: No erythema or rash.  Neurological:     Mental Status: She is alert and oriented to person, place, and time.  Psychiatric:        Behavior: Behavior normal.        Thought Content: Thought content normal.        Judgment: Judgment normal.      Lab Results  Component Value Date   WBC 6.2 10/27/2018   HGB 11.5 (L) 10/27/2018   HCT 35.1 (L) 10/27/2018   MCV 93.6 10/27/2018   PLT 203 10/27/2018   Lab Results  Component Value Date   FERRITIN 843 (H) 09/30/2018   IRON 87 09/30/2018   TIBC 288 09/30/2018   UIBC 200 09/30/2018   IRONPCTSAT 30 09/30/2018   Lab Results   Component Value Date   RETICCTPCT 2.1 05/06/2018   RBC 3.75 (L) 10/27/2018   No results found for: KPAFRELGTCHN, LAMBDASER, KAPLAMBRATIO No results found for: IGGSERUM, IGA, IGMSERUM No results found for: Odetta Pink, SPEI   Chemistry      Component Value Date/Time   NA 138 10/27/2018 1122   NA 144 04/07/2017 1215   NA 135 (L) 03/11/2017 1416   K 4.4 10/27/2018 1122   K 4.0 04/07/2017 1215   K 3.9 03/11/2017 1416   CL 100 10/27/2018 1122   CL 103 04/07/2017 1215   CO2 28 10/27/2018 1122   CO2 29 04/07/2017 1215   CO2 23 03/11/2017 1416   BUN 17 10/27/2018 1122   BUN 13 04/07/2017 1215   BUN 16.1 03/11/2017 1416   CREATININE 0.62 10/27/2018 1122   CREATININE 0.8 04/07/2017 1215   CREATININE 0.9 03/11/2017 1416      Component Value Date/Time   CALCIUM 9.8 10/27/2018 1122   CALCIUM 9.6 04/07/2017 1215   CALCIUM 9.6 03/11/2017 1416   ALKPHOS 130 (H) 10/27/2018 1122   ALKPHOS 60 04/07/2017 1215   ALKPHOS 79 03/11/2017 1416   AST 14 (L) 10/27/2018 1122   AST 11 03/11/2017 1416   ALT 7 10/27/2018 1122   ALT 15 04/07/2017 1215   ALT 11 03/11/2017 1416   BILITOT 0.3 10/27/2018 1122   BILITOT 0.26 03/11/2017 1416      Impression and Plan: Ms. Whalin is a very pleasant 85 yo caucasian female with progressive metastatic non small cell adenocarcinoma of the lung.  I am thankful that she is doing as well as she is doing.  I really believe that we can watch her right now.  We will just use Zometa on her.  I do not think we have to do a another PET scan on her probably until August.  We will see her back in another month.  We will do the Zometa on her.  She is tolerating Zometa quite well.  Again, her quality of life is so much better and is happy that she is improving.    [name], MD 7/2/202012:29 PM

## 2018-10-31 LAB — IRON AND TIBC
Iron: 66 ug/dL (ref 41–142)
Saturation Ratios: 25 % (ref 21–57)
TIBC: 268 ug/dL (ref 236–444)
UIBC: 201 ug/dL (ref 120–384)

## 2018-10-31 LAB — FERRITIN: Ferritin: 667 ng/mL — ABNORMAL HIGH (ref 11–307)

## 2018-11-08 ENCOUNTER — Other Ambulatory Visit: Payer: Medicare Other

## 2018-11-09 ENCOUNTER — Other Ambulatory Visit: Payer: Self-pay

## 2018-11-09 ENCOUNTER — Telehealth: Payer: Self-pay | Admitting: *Deleted

## 2018-11-09 ENCOUNTER — Inpatient Hospital Stay (HOSPITAL_COMMUNITY)
Admission: EM | Admit: 2018-11-09 | Discharge: 2018-11-10 | DRG: 083 | Disposition: A | Discharge status: Home / Self Care | Payer: Medicare Other | Attending: Internal Medicine | Admitting: Internal Medicine

## 2018-11-09 ENCOUNTER — Emergency Department (HOSPITAL_COMMUNITY): Payer: Medicare Other

## 2018-11-09 ENCOUNTER — Inpatient Hospital Stay (HOSPITAL_COMMUNITY): Payer: Medicare Other

## 2018-11-09 ENCOUNTER — Encounter (HOSPITAL_COMMUNITY): Payer: Self-pay | Admitting: Emergency Medicine

## 2018-11-09 DIAGNOSIS — S066X9A Traumatic subarachnoid hemorrhage with loss of consciousness of unspecified duration, initial encounter: Principal | ICD-10-CM | POA: Diagnosis present

## 2018-11-09 DIAGNOSIS — R42 Dizziness and giddiness: Secondary | ICD-10-CM | POA: Diagnosis present

## 2018-11-09 DIAGNOSIS — J90 Pleural effusion, not elsewhere classified: Secondary | ICD-10-CM | POA: Diagnosis present

## 2018-11-09 DIAGNOSIS — R112 Nausea with vomiting, unspecified: Secondary | ICD-10-CM | POA: Diagnosis not present

## 2018-11-09 DIAGNOSIS — E039 Hypothyroidism, unspecified: Secondary | ICD-10-CM | POA: Diagnosis present

## 2018-11-09 DIAGNOSIS — R55 Syncope and collapse: Secondary | ICD-10-CM | POA: Diagnosis not present

## 2018-11-09 DIAGNOSIS — S065XAA Traumatic subdural hemorrhage with loss of consciousness status unknown, initial encounter: Secondary | ICD-10-CM

## 2018-11-09 DIAGNOSIS — E7800 Pure hypercholesterolemia, unspecified: Secondary | ICD-10-CM | POA: Diagnosis present

## 2018-11-09 DIAGNOSIS — S299XXA Unspecified injury of thorax, initial encounter: Secondary | ICD-10-CM | POA: Diagnosis not present

## 2018-11-09 DIAGNOSIS — Z66 Do not resuscitate: Secondary | ICD-10-CM | POA: Diagnosis present

## 2018-11-09 DIAGNOSIS — D72829 Elevated white blood cell count, unspecified: Secondary | ICD-10-CM | POA: Diagnosis not present

## 2018-11-09 DIAGNOSIS — I629 Nontraumatic intracranial hemorrhage, unspecified: Secondary | ICD-10-CM | POA: Diagnosis not present

## 2018-11-09 DIAGNOSIS — F419 Anxiety disorder, unspecified: Secondary | ICD-10-CM | POA: Diagnosis present

## 2018-11-09 DIAGNOSIS — I6200 Nontraumatic subdural hemorrhage, unspecified: Secondary | ICD-10-CM | POA: Diagnosis not present

## 2018-11-09 DIAGNOSIS — Z885 Allergy status to narcotic agent status: Secondary | ICD-10-CM

## 2018-11-09 DIAGNOSIS — I10 Essential (primary) hypertension: Secondary | ICD-10-CM

## 2018-11-09 DIAGNOSIS — S065X9A Traumatic subdural hemorrhage with loss of consciousness of unspecified duration, initial encounter: Secondary | ICD-10-CM

## 2018-11-09 DIAGNOSIS — G4700 Insomnia, unspecified: Secondary | ICD-10-CM | POA: Diagnosis present

## 2018-11-09 DIAGNOSIS — W1839XA Other fall on same level, initial encounter: Secondary | ICD-10-CM | POA: Diagnosis present

## 2018-11-09 DIAGNOSIS — E875 Hyperkalemia: Secondary | ICD-10-CM | POA: Diagnosis not present

## 2018-11-09 DIAGNOSIS — E0590 Thyrotoxicosis, unspecified without thyrotoxic crisis or storm: Secondary | ICD-10-CM | POA: Diagnosis not present

## 2018-11-09 DIAGNOSIS — E1169 Type 2 diabetes mellitus with other specified complication: Secondary | ICD-10-CM

## 2018-11-09 DIAGNOSIS — D649 Anemia, unspecified: Secondary | ICD-10-CM | POA: Diagnosis present

## 2018-11-09 DIAGNOSIS — Z20828 Contact with and (suspected) exposure to other viral communicable diseases: Secondary | ICD-10-CM | POA: Diagnosis present

## 2018-11-09 DIAGNOSIS — W19XXXA Unspecified fall, initial encounter: Secondary | ICD-10-CM | POA: Diagnosis not present

## 2018-11-09 DIAGNOSIS — C7951 Secondary malignant neoplasm of bone: Secondary | ICD-10-CM | POA: Diagnosis present

## 2018-11-09 DIAGNOSIS — Z9221 Personal history of antineoplastic chemotherapy: Secondary | ICD-10-CM

## 2018-11-09 DIAGNOSIS — G8929 Other chronic pain: Secondary | ICD-10-CM | POA: Diagnosis present

## 2018-11-09 DIAGNOSIS — E876 Hypokalemia: Secondary | ICD-10-CM | POA: Diagnosis present

## 2018-11-09 DIAGNOSIS — E785 Hyperlipidemia, unspecified: Secondary | ICD-10-CM | POA: Diagnosis present

## 2018-11-09 DIAGNOSIS — R4182 Altered mental status, unspecified: Secondary | ICD-10-CM | POA: Diagnosis not present

## 2018-11-09 DIAGNOSIS — Z79899 Other long term (current) drug therapy: Secondary | ICD-10-CM

## 2018-11-09 DIAGNOSIS — W1830XA Fall on same level, unspecified, initial encounter: Secondary | ICD-10-CM | POA: Diagnosis not present

## 2018-11-09 DIAGNOSIS — Z981 Arthrodesis status: Secondary | ICD-10-CM

## 2018-11-09 DIAGNOSIS — Z7984 Long term (current) use of oral hypoglycemic drugs: Secondary | ICD-10-CM

## 2018-11-09 DIAGNOSIS — I609 Nontraumatic subarachnoid hemorrhage, unspecified: Secondary | ICD-10-CM

## 2018-11-09 DIAGNOSIS — S066X0A Traumatic subarachnoid hemorrhage without loss of consciousness, initial encounter: Secondary | ICD-10-CM | POA: Diagnosis not present

## 2018-11-09 DIAGNOSIS — Z03818 Encounter for observation for suspected exposure to other biological agents ruled out: Secondary | ICD-10-CM | POA: Diagnosis not present

## 2018-11-09 DIAGNOSIS — Z79890 Hormone replacement therapy (postmenopausal): Secondary | ICD-10-CM

## 2018-11-09 DIAGNOSIS — Z8249 Family history of ischemic heart disease and other diseases of the circulatory system: Secondary | ICD-10-CM

## 2018-11-09 DIAGNOSIS — Z833 Family history of diabetes mellitus: Secondary | ICD-10-CM

## 2018-11-09 DIAGNOSIS — M545 Low back pain: Secondary | ICD-10-CM | POA: Diagnosis present

## 2018-11-09 DIAGNOSIS — R1111 Vomiting without nausea: Secondary | ICD-10-CM | POA: Diagnosis not present

## 2018-11-09 DIAGNOSIS — S02119A Unspecified fracture of occiput, initial encounter for closed fracture: Secondary | ICD-10-CM

## 2018-11-09 DIAGNOSIS — M810 Age-related osteoporosis without current pathological fracture: Secondary | ICD-10-CM | POA: Diagnosis present

## 2018-11-09 DIAGNOSIS — Z85828 Personal history of other malignant neoplasm of skin: Secondary | ICD-10-CM

## 2018-11-09 DIAGNOSIS — Z888 Allergy status to other drugs, medicaments and biological substances status: Secondary | ICD-10-CM

## 2018-11-09 DIAGNOSIS — D696 Thrombocytopenia, unspecified: Secondary | ICD-10-CM | POA: Diagnosis present

## 2018-11-09 DIAGNOSIS — Z85118 Personal history of other malignant neoplasm of bronchus and lung: Secondary | ICD-10-CM

## 2018-11-09 DIAGNOSIS — Y92009 Unspecified place in unspecified non-institutional (private) residence as the place of occurrence of the external cause: Secondary | ICD-10-CM

## 2018-11-09 DIAGNOSIS — C3431 Malignant neoplasm of lower lobe, right bronchus or lung: Secondary | ICD-10-CM | POA: Diagnosis not present

## 2018-11-09 DIAGNOSIS — Z923 Personal history of irradiation: Secondary | ICD-10-CM

## 2018-11-09 DIAGNOSIS — Z902 Acquired absence of lung [part of]: Secondary | ICD-10-CM

## 2018-11-09 DIAGNOSIS — S065X0A Traumatic subdural hemorrhage without loss of consciousness, initial encounter: Secondary | ICD-10-CM | POA: Diagnosis not present

## 2018-11-09 DIAGNOSIS — Z86711 Personal history of pulmonary embolism: Secondary | ICD-10-CM

## 2018-11-09 DIAGNOSIS — R110 Nausea: Secondary | ICD-10-CM | POA: Diagnosis not present

## 2018-11-09 DIAGNOSIS — E1165 Type 2 diabetes mellitus with hyperglycemia: Secondary | ICD-10-CM | POA: Diagnosis present

## 2018-11-09 LAB — CBC WITH DIFFERENTIAL/PLATELET
Abs Immature Granulocytes: 0.2 10*3/uL — ABNORMAL HIGH (ref 0.00–0.07)
Basophils Absolute: 0 10*3/uL (ref 0.0–0.1)
Basophils Relative: 0 %
Eosinophils Absolute: 0 10*3/uL (ref 0.0–0.5)
Eosinophils Relative: 0 %
HCT: 37.6 % (ref 36.0–46.0)
Hemoglobin: 12.2 g/dL (ref 12.0–15.0)
Immature Granulocytes: 1 %
Lymphocytes Relative: 2 %
Lymphs Abs: 0.4 10*3/uL — ABNORMAL LOW (ref 0.7–4.0)
MCH: 30.6 pg (ref 26.0–34.0)
MCHC: 32.4 g/dL (ref 30.0–36.0)
MCV: 94.2 fL (ref 80.0–100.0)
Monocytes Absolute: 0.6 10*3/uL (ref 0.1–1.0)
Monocytes Relative: 3 %
Neutro Abs: 16.1 10*3/uL — ABNORMAL HIGH (ref 1.7–7.7)
Neutrophils Relative %: 94 %
Platelets: 192 10*3/uL (ref 150–400)
RBC: 3.99 MIL/uL (ref 3.87–5.11)
RDW: 13.9 % (ref 11.5–15.5)
WBC: 17.3 10*3/uL — ABNORMAL HIGH (ref 4.0–10.5)
nRBC: 0 % (ref 0.0–0.2)

## 2018-11-09 LAB — URINALYSIS, ROUTINE W REFLEX MICROSCOPIC
Bilirubin Urine: NEGATIVE
Glucose, UA: 50 mg/dL — AB
Hgb urine dipstick: NEGATIVE
Ketones, ur: NEGATIVE mg/dL
Leukocytes,Ua: NEGATIVE
Nitrite: NEGATIVE
Protein, ur: NEGATIVE mg/dL
Specific Gravity, Urine: 1.009 (ref 1.005–1.030)
pH: 7 (ref 5.0–8.0)

## 2018-11-09 LAB — HEPATIC FUNCTION PANEL
ALT: 13 U/L (ref 0–44)
AST: 25 U/L (ref 15–41)
Albumin: 3.9 g/dL (ref 3.5–5.0)
Alkaline Phosphatase: 137 U/L — ABNORMAL HIGH (ref 38–126)
Bilirubin, Direct: 0.1 mg/dL (ref 0.0–0.2)
Indirect Bilirubin: 0.3 mg/dL (ref 0.3–0.9)
Total Bilirubin: 0.4 mg/dL (ref 0.3–1.2)
Total Protein: 7.1 g/dL (ref 6.5–8.1)

## 2018-11-09 LAB — TSH: TSH: 1.173 u[IU]/mL (ref 0.350–4.500)

## 2018-11-09 LAB — BASIC METABOLIC PANEL
Anion gap: 12 (ref 5–15)
BUN: 18 mg/dL (ref 8–23)
CO2: 23 mmol/L (ref 22–32)
Calcium: 8.9 mg/dL (ref 8.9–10.3)
Chloride: 102 mmol/L (ref 98–111)
Creatinine, Ser: 0.58 mg/dL (ref 0.44–1.00)
GFR calc Af Amer: >60 mL/min (ref >60)
GFR calc non Af Amer: >60 mL/min (ref >60)
Glucose, Bld: 208 mg/dL — ABNORMAL HIGH (ref 70–99)
Potassium: 3.2 mmol/L — ABNORMAL LOW (ref 3.5–5.1)
Sodium: 137 mmol/L (ref 135–145)

## 2018-11-09 LAB — PROTIME-INR
INR: 0.9 (ref 0.8–1.2)
Prothrombin Time: 12 seconds (ref 11.4–15.2)

## 2018-11-09 LAB — GLUCOSE, CAPILLARY: Glucose-Capillary: 152 mg/dL — ABNORMAL HIGH (ref 70–99)

## 2018-11-09 LAB — APTT: aPTT: 24 seconds (ref 24–36)

## 2018-11-09 LAB — SARS CORONAVIRUS 2 BY RT PCR (HOSPITAL ORDER, PERFORMED IN ~~LOC~~ HOSPITAL LAB): SARS Coronavirus 2: NEGATIVE

## 2018-11-09 MED ORDER — SODIUM CHLORIDE 0.9 % IV BOLUS
1000.0000 mL | Freq: Once | INTRAVENOUS | Status: AC
  Administered 2018-11-09: 1000 mL via INTRAVENOUS

## 2018-11-09 MED ORDER — ACETAMINOPHEN 650 MG RE SUPP: 650.0000 mg | Freq: Four times a day (QID) | RECTAL | Status: DC | PRN

## 2018-11-09 MED ORDER — ACETAMINOPHEN 325 MG PO TABS
650.0000 mg | ORAL_TABLET | Freq: Four times a day (QID) | ORAL | Status: DC | PRN
  Administered 2018-11-09 – 2018-11-10 (×2): 650 mg via ORAL
  Filled 2018-11-09 (×2): qty 2

## 2018-11-09 MED ORDER — INSULIN ASPART 100 UNIT/ML ~~LOC~~ SOLN
0.0000 [IU] | Freq: Three times a day (TID) | SUBCUTANEOUS | Status: DC
  Administered 2018-11-10: 1 [IU] via SUBCUTANEOUS

## 2018-11-09 MED ORDER — ONDANSETRON HCL 4 MG/2ML IJ SOLN: 4.0000 mg | Freq: Four times a day (QID) | INTRAMUSCULAR | Status: DC | PRN

## 2018-11-09 MED ORDER — ONDANSETRON HCL 4 MG PO TABS: 4.0000 mg | ORAL_TABLET | Freq: Four times a day (QID) | ORAL | Status: DC | PRN

## 2018-11-09 MED ORDER — AMLODIPINE BESYLATE 5 MG PO TABS
5.0000 mg | ORAL_TABLET | Freq: Every day | ORAL | Status: DC
  Administered 2018-11-09 – 2018-11-10 (×2): 5 mg via ORAL
  Filled 2018-11-09 (×2): qty 1

## 2018-11-09 MED ORDER — SENNA 8.6 MG PO TABS
1.0000 | ORAL_TABLET | Freq: Two times a day (BID) | ORAL | Status: DC
  Administered 2018-11-10: 8.6 mg via ORAL
  Filled 2018-11-09 (×2): qty 1

## 2018-11-09 MED ORDER — INSULIN ASPART 100 UNIT/ML ~~LOC~~ SOLN: 0.0000 [IU] | Freq: Every day | SUBCUTANEOUS | Status: DC

## 2018-11-09 MED ORDER — GABAPENTIN 100 MG PO CAPS
100.0000 mg | ORAL_CAPSULE | Freq: Every evening | ORAL | Status: DC
  Administered 2018-11-09: 100 mg via ORAL
  Filled 2018-11-09 (×2): qty 1

## 2018-11-09 MED ORDER — TRAZODONE HCL 50 MG PO TABS: 25.0000 mg | ORAL_TABLET | Freq: Every evening | ORAL | Status: DC | PRN

## 2018-11-09 MED ORDER — PROCHLORPERAZINE EDISYLATE 10 MG/2ML IJ SOLN
10.0000 mg | Freq: Once | INTRAMUSCULAR | Status: AC
  Administered 2018-11-09: 10 mg via INTRAVENOUS
  Filled 2018-11-09: qty 2

## 2018-11-09 MED ORDER — MECLIZINE HCL 25 MG PO TABS
25.0000 mg | ORAL_TABLET | Freq: Once | ORAL | Status: AC
  Administered 2018-11-09: 25 mg via ORAL
  Filled 2018-11-09: qty 1

## 2018-11-09 MED ORDER — POTASSIUM CHLORIDE CRYS ER 20 MEQ PO TBCR
40.0000 meq | EXTENDED_RELEASE_TABLET | Freq: Four times a day (QID) | ORAL | Status: AC
  Administered 2018-11-09 (×2): 40 meq via ORAL
  Filled 2018-11-09 (×2): qty 2

## 2018-11-09 MED ORDER — POLYETHYLENE GLYCOL 3350 17 G PO PACK: 17.0000 g | PACK | Freq: Every day | ORAL | Status: DC | PRN

## 2018-11-09 NOTE — ED Provider Notes (Signed)
[facility] DEPT Provider Note   CSN: [id number] Arrival date & time: 11/09/18  1031    History   Chief Complaint Chief Complaint  Patient presents with   Fall   Dizziness    HPI INDA MCGLOTHEN is a 85 y.o. female.     The history is provided by the patient.  Dizziness Quality:  Vertigo Severity:  Moderate Onset quality:  Gradual Timing:  Intermittent Progression:  Waxing and waning Chronicity:  Recurrent Context: head movement   Relieved by:  Being still Worsened by:  Nothing Associated symptoms: no blood in stool, no chest pain, no diarrhea, no headaches, no hearing loss, no nausea, no palpitations, no shortness of breath, no tinnitus, no vision changes, no vomiting and no weakness   Risk factors: hx of vertigo     Past Medical History:  Diagnosis Date   Anxiety    Back pain    bulding disc   Bronchitis    hx of-many,many years    Diabetes mellitus    Diabetes mellitus    type 2 and takes Metformin bid   Dyslipidemia    Embolism - blood clot    hx of in left lower leg and was on Coumadin for about 65month;this was about 8-158yrago   Family history of adverse reaction to anesthesia    Daughter- Nausea   Goals of care, counseling/discussion 11/12/2017   History of colonic polyps    History of radiation therapy 01/06/16-02/19/16   The primary tumor and involved mediastinal adenopathy were treated to 66 Gy in 33 fractions of 2 Gy.   History of radiation therapy 04/01/17-04/26/17   cervical spine 35 Gy in 14 fractions   HOH (hard of hearing)    Hx of migraines    as a teenager   Hypercholesterolemia    Hyperlipidemia    takes Lovastatin every other day   Hypertension    takes Ziac daily and Ramipril as well   Hyperthyroidism    Hypothyroidism    takes Synthroid every other day   Insomnia    takes Elavil nightly   Joint pain    Lung mass    Myalgia    Osteoarthritis    Osteoporosis     Pneumothorax of right lung after biopsy 07/09/11   HAD CHEST TUBE PLACEMENT   PONV (postoperative nausea and vomiting)    Primary adenocarcinoma of lower lobe of right lung ([facility]/27/2013   pT2a, pN0 M0 Stage 1 Well differenced Adenocarcinoma 3.5 cm resected 07/17/2011   Primary adenocarcinoma of lung ([facility] [place]/27/2013   Shortness of breath dyspnea    With exertion   Skin cancer    legs have dark spots on them   Upper respiratory infection 04/2011   Urinary incontinence    takes Detrol daily prn    Patient Active Problem List   Diagnosis Date Noted   Intracranial bleed ([facility]/15/2020   Vitamin B 12 deficiency 03/10/2018   Goals of care, counseling/discussion 11/12/2017   IDA (iron deficiency anemia) 03/12/2017   Radiculopathy 02/17/2017   Lung cancer metastatic to bone ([facility]/03/2017   Thyrotoxicosis without thyroid storm 01/18/2017   Bilateral hearing loss 08/27/2016   Knee injury 08/27/2016   Toxic multinodular goiter w/o crisis 06/22/2016   Fatigue 05/05/2016   Macular degeneration 03/09/2016   Encounter for antineoplastic chemotherapy 12/19/2015   Meralgia paresthetica of left side 07/10/2015   Osteoporosis 07/10/2015   Onychomycosis 07/10/2015   Diabetes ([facility]03/15/2017   Cough  12/12/2013   Pneumothorax after biopsy 07/09/2011    Class: Acute   Primary adenocarcinoma of lower lobe of right lung ([place]) 06/24/2011   Hypercholesterolemia 10/20/2010   Essential hypertension, benign 10/20/2010   Osteoarthritis 10/20/2010    Past Surgical History:  Procedure Laterality Date   ABDOMINAL HYSTERECTOMY     ANTERIOR CERVICAL DECOMP/DISCECTOMY FUSION N/A 02/17/2017   Procedure: ANTERIOR CERVICAL DECOMPRESSION FUSION, CERVICAL 6-7 WITH INSTRUMENTATION AND ALLOGRAFT;  Surgeon: Phylliss Bob, MD;  Location: [facility];  Service: Orthopedics;  Laterality: N/A;  ANTERIOR CERVICAL DECOMPRESSION FUSION, CERVICAL 6-7 WITH INSTRUMENTATION AND ALLOGRAFT;  REQUEST 2 HOURS AND FLIP    APPENDECTOMY     as a child   CHEST TUBE REMOVAL  07/13/11   RIGHT  CHEST TUBE   CHOLECYSTECTOMY     COLONOSCOPY     COLONOSCOPY     DILATION AND CURETTAGE OF UTERUS     x 2   EYE SURGERY Bilateral    Cataract   HEMORRHOID SURGERY     IR THORACENTESIS ASP PLEURAL SPACE W/IMG GUIDE  02/03/2018   LUNG BIOPSY  07/09/11   RIGHT UPER LOBE LUNG MASS    LUNG LOBECTOMY Right 06/2011   POSTERIOR CERVICAL FUSION/FORAMINOTOMY N/A 02/18/2017   Procedure: CERVICAL Five-Six, CERVICAL Six-Seven, CERVICAL Seven-THORACIC One, THORACIC One-Two POSTERIOR SPINAL FUSION WITH INSTRUMENTATION AND ALLOGRAFT;  Surgeon: Phylliss Bob, MD;  Location: [facility];  Service: Orthopedics;  Laterality: N/A;   RIGHT CHEST TUBE PLACEMENT  07/10/11   S/P LUNG BX   ROTATOR CUFF REPAIR     bilateral   TOE SURGERY  2013   right small toe   TONSILLECTOMY     as a child   VIDEO BRONCHOSCOPY  07/17/2011   Procedure: VIDEO BRONCHOSCOPY;  Surgeon: Grace Isaac, MD;  Location: [facility];  Service: Thoracic;  Laterality: N/A;   VIDEO BRONCHOSCOPY WITH ENDOBRONCHIAL ULTRASOUND N/A 12/10/2015   Procedure: VIDEO BRONCHOSCOPY WITH ENDOBRONCHIAL ULTRASOUND with biopsies.;  Surgeon: Grace Isaac, MD;  Location: [facility];  Service: Thoracic;  Laterality: N/A;     OB History   No obstetric history on file.      Home Medications    Prior to Admission medications   Medication Sig Start Date End Date Taking? Authorizing Provider  amitriptyline (ELAVIL) 10 MG tablet TAKE 1 TABLET BY MOUTH EVERYDAY AT BEDTIME 05/10/17   Burns, Claudina Lick, MD  amLODipine (NORVASC) 5 MG tablet Take 1 tablet (5 mg total) by mouth daily. Patient taking differently: Take 5 mg by mouth every other day.  07/07/18   Binnie Rail, MD  bisoprolol-hydrochlorothiazide (ZIAC) 2.5-6.25 MG tablet TAKE 1 TABLET BY MOUTH EVERY DAY 06/14/18   Binnie Rail, MD  Blood Glucose Monitoring Suppl (ONE TOUCH ULTRA 2) w/Device KIT  Use to check blood sugars twice a day Dx E11.9 01/07/16   Binnie Rail, MD  calcium carbonate (OS-CAL) 600 MG tablet Take 600 mg by mouth daily.    [provider]  cholecalciferol (VITAMIN D) 1000 units tablet Take 2,000 Units by mouth daily.     [provider]  gabapentin (NEURONTIN) 100 MG capsule Take 100 mg by mouth daily.     [provider]  glucose blood (ONE TOUCH ULTRA TEST) test strip 1 each by Other route 2 (two) times daily. Use to check blood sugars twice a day 07/31/16   Binnie Rail, MD  HYDROcodone-homatropine [facility]) 5-1.5 MG/5ML syrup Take 5 mLs by mouth every 6 (six) hours  as needed for cough. 01/20/18   [name], MD  Lancets [facility] ULTRASOFT) lancets 1 each by Other route 2 (two) times daily. Use to help check blood sugars twice a day 07/31/16   Binnie Rail, MD  lovastatin (MEVACOR) 20 MG tablet TAKE 1 TABLET BY MOUTH EVERY OTHER DAY 09/29/18   Binnie Rail, MD  metFORMIN (GLUCOPHAGE) 500 MG tablet Take 1 tablet (500 mg total) by mouth 2 (two) times daily with a meal. 08/04/18   Burns, Claudina Lick, MD  methimazole (TAPAZOLE) 5 MG tablet Take 2.5 mg every other day 07/12/18   Philemon Kingdom, MD  Multiple Vitamins-Minerals (VISION-VITE PRESERVE PO) Take 1 tablet by mouth daily.     [provider]  Probiotic Product (ALIGN PO) Take by mouth daily.    [provider]  prochlorperazine (COMPAZINE) 10 MG tablet Take 1 tablet (10 mg total) by mouth every 6 (six) hours as needed (Nausea or vomiting). 12/30/17 05/06/18  [name], MD    Family History Family History  Problem Relation Age of Onset   Hypertension Mother    Hypertension Father    Arthritis Sister    Arthritis Sister    Diabetes Brother    Hypertension Sister    Anesthesia problems Neg Hx    Hypotension Neg Hx    Malignant hyperthermia Neg Hx    Pseudochol deficiency Neg Hx    Heart attack Neg Hx     Social History Social History    Tobacco Use   Smoking status: Never Smoker   Smokeless tobacco: Never Used  Substance Use Topics   Alcohol use: No    Alcohol/week: 0.0 standard drinks    Comment: "rarely have a glass of wine"   Drug use: No     Allergies   Ramipril, Tramadol, Codeine, and Demerol   Review of Systems Review of Systems  Constitutional: Negative for chills and fever.  HENT: Negative for ear pain, hearing loss, sore throat and tinnitus.   Eyes: Negative for pain and visual disturbance.  Respiratory: Negative for cough and shortness of breath.   Cardiovascular: Negative for chest pain and palpitations.  Gastrointestinal: Negative for abdominal pain, blood in stool, diarrhea, nausea and vomiting.  Genitourinary: Negative for dysuria and hematuria.  Musculoskeletal: Negative for arthralgias and back pain.  Skin: Negative for color change and rash.  Neurological: Positive for dizziness. Negative for tremors, seizures, syncope, facial asymmetry, speech difficulty, weakness, light-headedness, numbness and headaches.  All other systems reviewed and are negative.    Physical Exam Updated Vital Signs  ED Triage Vitals  Enc Vitals Group     BP 11/09/18 1046 (!) 150/84     Pulse Rate 11/09/18 1046 94     Resp 11/09/18 1046 14     Temp 11/09/18 1046 97.8 F (36.6 C)     Temp Source 11/09/18 1046 Oral     SpO2 11/09/18 1042 96 %     Weight --      Height --      Head Circumference --      Peak Flow --      Pain Score --      Pain Loc --      Pain Edu? --      Excl. in [place]? --     Physical Exam Vitals signs and nursing note reviewed.  Constitutional:      General: She is not in acute distress.    Appearance: She is well-developed. She  is not ill-appearing.  HENT:     Head:     Comments: Left sided occipital hematoma.    Right Ear: Tympanic membrane normal.     Nose: Nose normal.     Mouth/Throat:     Mouth: Mucous membranes are moist.  Eyes:     Extraocular Movements:  Extraocular movements intact.     Conjunctiva/sclera: Conjunctivae normal.     Pupils: Pupils are equal, round, and reactive to light.  Neck:     Musculoskeletal: Normal range of motion and neck supple.  Cardiovascular:     Rate and Rhythm: Normal rate and regular rhythm.     Pulses: Normal pulses.     Heart sounds: Normal heart sounds. No murmur.  Pulmonary:     Effort: Pulmonary effort is normal. No respiratory distress.     Breath sounds: Normal breath sounds.  Abdominal:     General: There is no distension.     Palpations: Abdomen is soft.     Tenderness: There is no abdominal tenderness.  Musculoskeletal: Normal range of motion.        General: Tenderness (ttp to muscles of low back, no midline spinal tenderness) present.  Skin:    General: Skin is warm and dry.     Capillary Refill: Capillary refill takes less than 2 seconds.  Neurological:     General: No focal deficit present.     Mental Status: She is alert and oriented to person, place, and time.     Cranial Nerves: No cranial nerve deficit.     Sensory: No sensory deficit.     Motor: No weakness.     Coordination: Coordination normal.     Comments: + dix hallpike to the right, 5+/5 strength, normal sensation, normal finger to nose finger, no drift, normal speech  Psychiatric:        Mood and Affect: Mood normal.      ED Treatments / Results  Labs (all labs ordered are listed, but only abnormal results are displayed) Labs Reviewed  CBC WITH DIFFERENTIAL/PLATELET - Abnormal; Notable for the following components:      Result Value   WBC 17.3 (*)    Neutro Abs 16.1 (*)    Lymphs Abs 0.4 (*)    Abs Immature Granulocytes 0.20 (*)    All other components within normal limits  BASIC METABOLIC PANEL - Abnormal; Notable for the following components:   Potassium 3.2 (*)    Glucose, Bld 208 (*)    All other components within normal limits  URINALYSIS, ROUTINE W REFLEX MICROSCOPIC - Abnormal; Notable for the following  components:   Color, Urine STRAW (*)    Glucose, UA 50 (*)    All other components within normal limits  HEPATIC FUNCTION PANEL - Abnormal; Notable for the following components:   Alkaline Phosphatase 137 (*)    All other components within normal limits  SARS CORONAVIRUS 2 (HOSPITAL ORDER, [facility])  TSH  PROTIME-INR  APTT    EKG EKG Interpretation  Date/Time:  Wednesday November 09 2018 11:24:35 EDT Ventricular Rate:  87 PR Interval:    QRS Duration: 88 QT Interval:  371 QTC Calculation: 447 R Axis:   59 Text Interpretation:  Sinus rhythm Confirmed by Lennice Sites [phone]) on 11/09/2018 11:38:01 AM   Radiology Dg Chest 2 View  Result Date: 11/09/2018 CLINICAL DATA:  Recent fall EXAM: CHEST - 2 VIEW COMPARISON:  PET-CT from 09/06/2018 FINDINGS: Cardiac shadow is stable. Aortic  calcifications are seen. Postsurgical changes at the cervicothoracic junction are seen. Chronic changes in the right upper lobe are noted consistent with prior surgery and treatment. Right-sided pleural effusion is again identified and stable. No acute bony abnormality is noted. IMPRESSION: Stable changes in the right hemithorax consistent with prior surgery and treatment. Pleural effusion is again noted on the right stable from previous exam. Electronically Signed   By: Inez Catalina M.D.   On: 11/09/2018 12:33   Dg Lumbar Spine Complete  Result Date: 11/09/2018 CLINICAL DATA:  Recent fall with low back pain, initial encounter EXAM: LUMBAR SPINE - COMPLETE 4+ VIEW COMPARISON:  07/15/2018 FINDINGS: Five lumbar type vertebral bodies are well visualized. Mild scoliosis concave to the right is seen. Vertebral body height is well maintained. There are areas of sclerosis noted within the L3 and L4 vertebral bodies similar to that noted on prior PET-CT. Similar changes are noted scattered throughout the pelvic bones. No anterolisthesis is noted. Rim calcified splenic cyst is noted in the left  upper quadrant. IMPRESSION: Sclerotic foci within the lumbar spine and pelvic bones consistent with the known history of prior metastatic disease. No acute compression deformity is seen. Rim calcified splenic cyst in the left upper quadrant. Electronically Signed   By: Inez Catalina M.D.   On: 11/09/2018 12:32   Ct Head Wo Contrast  Result Date: 11/09/2018 CLINICAL DATA:  Recent fall, initial encounter EXAM: CT HEAD WITHOUT CONTRAST TECHNIQUE: Contiguous axial images were obtained from the base of the skull through the vertex without intravenous contrast. COMPARISON:  08/25/2016 FINDINGS: Brain: There are small subdural hematomas along the left half of the tentorium cerebelli (3 mm in thickness) and to the left of the falx cerebri (4 mm in thickness). Additional small subdural hematomas are noted in the right parietal region and right frontal best seen on the coronal reconstructions. It measures 3 mm in thickness as well. Small areas of subarachnoid hemorrhage are noted with some parenchymal contusion in the right frontal lobe as well as within the right temporal lobe. Vascular: No hyperdense vessel or unexpected calcification. Skull: Undisplaced fracture is noted through the left occipital bone coursing adjacent to the foramen magnum. Old nasal bone fractures are noted bilaterally. Sinuses/Orbits: Within normal limits. Other: Scalp hematoma is noted over the left occipital fracture. IMPRESSION: Areas of subarachnoid hemorrhage in the right frontal and right temporal lobes. These are likely related to coup-contrecoup mechanism from the left occipital injury. Multiple small subdural hematomas along the left tentorium cerebelli as well as just to the left of the midline along the falx near the vertex posteriorly as well as a small right parietal and right frontal lobe subdural hematomas. These measure 3-4 mm in thickness at each location in cause no significant mass effect at this time. Left occipital scalp  hematoma with underlying occipital bone fracture. No significant displacement of the fracture is seen. Critical Value/emergent results were called by telephone at the time of interpretation on 11/09/2018 at 12:29 pm to Dr. Lennice Sites , who verbally acknowledged these results. Electronically Signed   By: Inez Catalina M.D.   On: 11/09/2018 12:29    Procedures .Critical Care Performed by: Lennice Sites, DO Authorized by: Lennice Sites, DO   Critical care provider statement:    Critical care time (minutes):  35   Critical care was necessary to treat or prevent imminent or life-threatening deterioration of the following conditions:  CNS failure or compromise   Critical care was time spent personally by  me on the following activities:  Blood draw for specimens, development of treatment plan with patient or surrogate, discussions with consultants, discussions with primary provider, evaluation of patient's response to treatment, examination of patient, obtaining history from patient or surrogate, ordering and performing treatments and interventions, ordering and review of laboratory studies, ordering and review of radiographic studies, pulse oximetry, re-evaluation of patient's condition and review of old charts   I assumed direction of critical care for this patient from another provider in my specialty: no     (including critical care time)  Medications Ordered in ED Medications  acetaminophen (TYLENOL) tablet 650 mg (has no administration in time range)    Or  acetaminophen (TYLENOL) suppository 650 mg (has no administration in time range)  traZODone (DESYREL) tablet 25 mg (has no administration in time range)  senna (SENOKOT) tablet 8.6 mg (has no administration in time range)  polyethylene glycol (MIRALAX / GLYCOLAX) packet 17 g (has no administration in time range)  ondansetron (ZOFRAN) tablet 4 mg (has no administration in time range)    Or  ondansetron (ZOFRAN) injection 4 mg (has no  administration in time range)  sodium chloride 0.9 % bolus 1,000 mL (0 mLs Intravenous Stopped 11/09/18 1217)  meclizine (ANTIVERT) tablet 25 mg (25 mg Oral Given 11/09/18 1126)  prochlorperazine (COMPAZINE) injection 10 mg (10 mg Intravenous Given 11/09/18 1128)     Initial Impression / Assessment and Plan / ED Course  I have reviewed the triage vital signs and the nursing notes.  Pertinent labs & imaging results that were available during my care of the patient were reviewed by me and considered in my medical decision making (see chart for details).     VINETA CARONE is an 85 year old female with history of diabetes, high cholesterol, PE but no longer on anticoagulation who presents to the ED with dizziness, fall.  Patient with unremarkable vitals.  No fever.  Patient felt dizzy/vertigo type symptoms before she fell.  She was on the ground.  She does not remember what happened.  Does not know if she hit her head.  Has chronic low back pain.  Patient neurologically intact.  No laceration.  States she is not on a blood thinner. CT scan of the head shows areas of subarachnoid hemorrhage in the right frontal and right temporal lobes likely related to acute contrecoup injury in the left occipital injury.  She has multiple small subdural hematomas along the left tentorium, small right parietal and right frontal lobe subdural hematomas.  No significant mass-effect.  She has left occipital scalp hematoma with an underlying occipital bone fracture.  No obvious displacement in this fracture.  Patient otherwise unremarkable lab work.  Stable pleural effusion.  No urinary tract infection.  She feels improved following Compazine and meclizine.  Will consult neurosurgery for recommendations.  Talked on the phone with radiology about CT scan reads.  Neurosurgery recommends repeat CT scan in the morning.  Patient to be admitted by hospitalist for further observation and care.  Admitted in stable condition.  This  chart was dictated using voice recognition software.  Despite best efforts to proofread,  errors can occur which can change the documentation meaning.   Final Clinical Impressions(s) / ED Diagnoses   Final diagnoses:  SDH (subdural hematoma) [facility])    ED Discharge Orders    None       Lennice Sites, DO 11/09/18 1402

## 2018-11-09 NOTE — ED Notes (Signed)
ED TO INPATIENT HANDOFF REPORT  ED Nurse Name and Phone #: Christinia Gully Name/Age/Gender Samantha Quinn 85 y.o. female Room/Bed: WA03/WA03  Code Status   Code Status: Full Code  Home/SNF/Other Given to floor Patient oriented to: self, place, time and situation Is this baseline? Yes   Triage Complete: Triage complete  Chief Complaint DIZZINESS AND FALL  Triage Note Pt fell this morning, witnessed by lawn maintenance, pt reports she felt dizzy prior to fall and then states she woke up and continued with dizziness. Pt with emesis since this morning. Pt also c/o chronic lower back pain that worsened with the fall. BS by EMS was 225. Pt is diabetic and has not had any of her medications this morning.    Allergies Allergies  Allergen Reactions  . Ramipril Other (See Comments)    Tongue swelling  . Tramadol Other (See Comments)    syncope syncope  . Codeine Nausea And Vomiting  . Demerol Nausea Only    Level of Care/Admitting Diagnosis ED Disposition    ED Disposition Condition [facility] Area: [place] [[id number]]  Level of Care: Telemetry Medical [104]  Covid Evaluation: Asymptomatic Screening Protocol (No Symptoms)  Diagnosis: Intracranial bleed [facility]) [[id number]]  Admitting Physician: Mercy Riding [[id number]]  Attending Physician: Mercy Riding [id number]  Estimated length of stay: past midnight tomorrow  Certification:: I certify this patient will need inpatient services for at least 2 midnights  PT Class (Do Not Modify): Inpatient [101]  PT Acc Code (Do Not Modify): Private [1]       B Medical/Surgery History Past Medical History:  Diagnosis Date  . Anxiety   . Back pain    bulding disc  . Bronchitis    hx of-many,many years   . Diabetes mellitus   . Diabetes mellitus    type 2 and takes Metformin bid  . Dyslipidemia   . Embolism - blood clot    hx of in left lower leg and was on Coumadin for about 53months;this was about  8-75yrs ago  . Family history of adverse reaction to anesthesia    Daughter- Nausea  . Goals of care, counseling/discussion 11/12/2017  . History of colonic polyps   . History of radiation therapy 01/06/16-02/19/16   The primary tumor and involved mediastinal adenopathy were treated to 66 Gy in 33 fractions of 2 Gy.  [name] History of radiation therapy 04/01/17-04/26/17   cervical spine 35 Gy in 14 fractions  . HOH (hard of hearing)   . Hx of migraines    as a teenager  . Hypercholesterolemia   . Hyperlipidemia    takes Lovastatin every other day  . Hypertension    takes Ziac daily and Ramipril as well  . Hyperthyroidism   . Hypothyroidism    takes Synthroid every other day  . Insomnia    takes Elavil nightly  . Joint pain   . Lung mass   . Myalgia   . Osteoarthritis   . Osteoporosis   . Pneumothorax of right lung after biopsy 07/09/11   HAD CHEST TUBE PLACEMENT  . PONV (postoperative nausea and vomiting)   . Primary adenocarcinoma of lower lobe of right lung ([place]) 06/24/2011   pT2a, pN0 M0 Stage 1 Well differenced Adenocarcinoma 3.5 cm resected 07/17/2011  . Primary adenocarcinoma of lung ([place]) 06/24/2011  . Shortness of breath dyspnea    With exertion  . Skin cancer    legs have dark spots  on them  . Upper respiratory infection 04/2011  . Urinary incontinence    takes Detrol daily prn   Past Surgical History:  Procedure Laterality Date  . ABDOMINAL HYSTERECTOMY    . ANTERIOR CERVICAL DECOMP/DISCECTOMY FUSION N/A 02/17/2017   Procedure: ANTERIOR CERVICAL DECOMPRESSION FUSION, CERVICAL 6-7 WITH INSTRUMENTATION AND ALLOGRAFT;  Surgeon: Phylliss Bob, MD;  Location: [place];  Service: Orthopedics;  Laterality: N/A;  ANTERIOR CERVICAL DECOMPRESSION FUSION, CERVICAL 6-7 WITH INSTRUMENTATION AND ALLOGRAFT; REQUEST 2 HOURS AND FLIP   . APPENDECTOMY     as a child  . CHEST TUBE REMOVAL  07/13/11   RIGHT  CHEST TUBE  . CHOLECYSTECTOMY    . COLONOSCOPY    . COLONOSCOPY    . DILATION AND  CURETTAGE OF UTERUS     x 2  . EYE SURGERY Bilateral    Cataract  . HEMORRHOID SURGERY    . IR THORACENTESIS ASP PLEURAL SPACE W/IMG GUIDE  02/03/2018  . LUNG BIOPSY  07/09/11   RIGHT UPER LOBE LUNG MASS   . LUNG LOBECTOMY Right 06/2011  . POSTERIOR CERVICAL FUSION/FORAMINOTOMY N/A 02/18/2017   Procedure: CERVICAL Five-Six, CERVICAL Six-Seven, CERVICAL Seven-THORACIC One, THORACIC One-Two POSTERIOR SPINAL FUSION WITH INSTRUMENTATION AND ALLOGRAFT;  Surgeon: Phylliss Bob, MD;  Location: [place];  Service: Orthopedics;  Laterality: N/A;  . RIGHT CHEST TUBE PLACEMENT  07/10/11   S/P LUNG BX  . ROTATOR CUFF REPAIR     bilateral  . TOE SURGERY  2013   right small toe  . TONSILLECTOMY     as a child  . VIDEO BRONCHOSCOPY  07/17/2011   Procedure: VIDEO BRONCHOSCOPY;  Surgeon: Grace Isaac, MD;  Location: [place];  Service: Thoracic;  Laterality: N/A;  . VIDEO BRONCHOSCOPY WITH ENDOBRONCHIAL ULTRASOUND N/A 12/10/2015   Procedure: VIDEO BRONCHOSCOPY WITH ENDOBRONCHIAL ULTRASOUND with biopsies.;  Surgeon: Grace Isaac, MD;  Location: [place];  Service: Thoracic;  Laterality: N/A;     A IV Location/Drains/Wounds Patient Lines/Drains/Airways Status   Active Line/Drains/Airways    Name:   Placement date:   Placement time:   Site:   Days:   Peripheral IV 11/09/18 Left Antecubital   11/09/18    1030    Antecubital   less than 1   Incision 07/17/11 Chest Right   07/17/11    1417     2672   Incision (Closed) 02/17/17 Neck Other (Comment)   02/17/17    1432     630   Incision (Closed) 02/18/17 Neck Other (Comment)   02/18/17    1206     629   Wound / Incision (Open or Dehisced) 08/25/16 Other (Comment) Hand Right   08/25/16    0958    Hand   806   Wound / Incision (Open or Dehisced) 08/25/16 Other (Comment) Knee Right   08/25/16    0959    Knee   806   Wound / Incision (Open or Dehisced) 08/25/16 Other (Comment) Nose   08/25/16    1000    Nose   806          Intake/Output Last 24  hours  Intake/Output Summary (Last 24 hours) at 11/09/2018 1639 Last data filed at 11/09/2018 1230 Gross per 24 hour  Intake -  Output 600 ml  Net -600 ml    Labs/Imaging Results for orders placed or performed during the hospital encounter of 11/09/18 (from the past 48 hour(s))  CBC with Differential     Status: Abnormal  Collection Time: 11/09/18 11:06 AM  Result Value Ref Range   WBC 17.3 (H) 4.0 - 10.5 K/uL   RBC 3.99 3.87 - 5.11 MIL/uL   Hemoglobin 12.2 12.0 - 15.0 g/dL   HCT 37.6 36.0 - 46.0 %   MCV 94.2 80.0 - 100.0 fL   MCH 30.6 26.0 - 34.0 pg   MCHC 32.4 30.0 - 36.0 g/dL   RDW 13.9 11.5 - 15.5 %   Platelets 192 150 - 400 K/uL   nRBC 0.0 0.0 - 0.2 %   Neutrophils Relative % 94 %   Neutro Abs 16.1 (H) 1.7 - 7.7 K/uL   Lymphocytes Relative 2 %   Lymphs Abs 0.4 (L) 0.7 - 4.0 K/uL   Monocytes Relative 3 %   Monocytes Absolute 0.6 0.1 - 1.0 K/uL   Eosinophils Relative 0 %   Eosinophils Absolute 0.0 0.0 - 0.5 K/uL   Basophils Relative 0 %   Basophils Absolute 0.0 0.0 - 0.1 K/uL   Immature Granulocytes 1 %   Abs Immature Granulocytes 0.20 (H) 0.00 - 0.07 K/uL    Comment: Performed at [facility], [place] [address] [place], [place] [address]  Basic metabolic panel     Status: Abnormal   Collection Time: 11/09/18 11:06 AM  Result Value Ref Range   Sodium 137 135 - 145 mmol/L   Potassium 3.2 (L) 3.5 - 5.1 mmol/L   Chloride 102 98 - 111 mmol/L   CO2 23 22 - 32 mmol/L   Glucose, Bld 208 (H) 70 - 99 mg/dL   BUN 18 8 - 23 mg/dL   Creatinine, Ser 0.58 0.44 - 1.00 mg/dL   Calcium 8.9 8.9 - 10.3 mg/dL   GFR calc non Af Amer >60 >60 mL/min   GFR calc Af Amer >60 >60 mL/min   Anion gap 12 5 - 15    Comment: Performed at [facility], [place] [address] [place], [place] [address]  Hepatic function panel     Status: Abnormal   Collection Time: 11/09/18 11:06 AM  Result Value Ref Range   Total Protein 7.1 6.5 - 8.1 g/dL   Albumin 3.9 3.5 -  5.0 g/dL   AST 25 15 - 41 U/L   ALT 13 0 - 44 U/L   Alkaline Phosphatase 137 (H) 38 - 126 U/L   Total Bilirubin 0.4 0.3 - 1.2 mg/dL   Bilirubin, Direct 0.1 0.0 - 0.2 mg/dL   Indirect Bilirubin 0.3 0.3 - 0.9 mg/dL    Comment: Performed at [facility], [place] [address] [place], [place] [address]  Urinalysis, Routine w reflex microscopic     Status: Abnormal   Collection Time: 11/09/18 12:27 PM  Result Value Ref Range   Color, Urine STRAW (A) YELLOW   APPearance CLEAR CLEAR   Specific Gravity, Urine 1.009 1.005 - 1.030   pH 7.0 5.0 - 8.0   Glucose, UA 50 (A) NEGATIVE mg/dL   Hgb urine dipstick NEGATIVE NEGATIVE   Bilirubin Urine NEGATIVE NEGATIVE   Ketones, ur NEGATIVE NEGATIVE mg/dL   Protein, ur NEGATIVE NEGATIVE mg/dL   Nitrite NEGATIVE NEGATIVE   Leukocytes,Ua NEGATIVE NEGATIVE    Comment: Performed at [place] [address] [place], [place] [address]  SARS Coronavirus 2 (CEPHEID - Performed in [facility] hospital lab), Hosp Order     Status: None   Collection Time: 11/09/18  1:58 PM   Specimen: Nasopharyngeal Swab  Result Value Ref Range  SARS Coronavirus 2 NEGATIVE NEGATIVE    Comment: (NOTE) If result is NEGATIVE SARS-CoV-2 target nucleic acids are NOT DETECTED. The SARS-CoV-2 RNA is generally detectable in upper and lower  respiratory specimens during the acute phase of infection. The lowest  concentration of SARS-CoV-2 viral copies this assay can detect is 250  copies / mL. A negative result does not preclude SARS-CoV-2 infection  and should not be used as the sole basis for treatment or other  patient management decisions.  A negative result may occur with  improper specimen collection / handling, submission of specimen other  than nasopharyngeal swab, presence of viral mutation(s) within the  areas targeted by this assay, and inadequate number of viral copies  (<250 copies / mL). A negative result must be combined with  clinical  observations, patient history, and epidemiological information. If result is POSITIVE SARS-CoV-2 target nucleic acids are DETECTED. The SARS-CoV-2 RNA is generally detectable in upper and lower  respiratory specimens dur ing the acute phase of infection.  Positive  results are indicative of active infection with SARS-CoV-2.  Clinical  correlation with patient history and other diagnostic information is  necessary to determine patient infection status.  Positive results do  not rule out bacterial infection or co-infection with other viruses. If result is PRESUMPTIVE POSTIVE SARS-CoV-2 nucleic acids MAY BE PRESENT.   A presumptive positive result was obtained on the submitted specimen  and confirmed on repeat testing.  While 2019 novel coronavirus  (SARS-CoV-2) nucleic acids may be present in the submitted sample  additional confirmatory testing may be necessary for epidemiological  and / or clinical management purposes  to differentiate between  SARS-CoV-2 and other Sarbecovirus currently known to infect humans.  If clinically indicated additional testing with an alternate test  methodology [phone]) is advised. The SARS-CoV-2 RNA is generally  detectable in upper and lower respiratory sp ecimens during the acute  phase of infection. The expected result is Negative. Fact Sheet for Patients:  StrictlyIdeas.no Fact Sheet for Healthcare Providers: BankingDealers.co.za This test is not yet approved or cleared by the [place] FDA and has been authorized for detection and/or diagnosis of SARS-CoV-2 by FDA under an Emergency Use Authorization (EUA).  This EUA will remain in effect (meaning this test can be used) for the duration of the COVID-19 declaration under Section 564(b)(1) of the Act, 21 U.S.C. section 360bbb-3(b)(1), unless the authorization is terminated or revoked sooner. Performed at [facility],  [place] [address] [place], [place] [address]   TSH     Status: None   Collection Time: 11/09/18  3:23 PM  Result Value Ref Range   TSH 1.173 0.350 - 4.500 uIU/mL    Comment: Performed by a 3rd Generation assay with a functional sensitivity of <=0.01 uIU/mL. Performed at [facility], [place] [address], [place], [place] [address]   Protime-INR     Status: None   Collection Time: 11/09/18  3:23 PM  Result Value Ref Range   Prothrombin Time 12.0 11.4 - 15.2 seconds   INR 0.9 0.8 - 1.2    Comment: (NOTE) INR goal varies based on device and disease states. Performed at [facility], [place] [address] [place], [place] [address]   APTT     Status: None   Collection Time: 11/09/18  3:23 PM  Result Value Ref Range   aPTT 24 24 - 36 seconds    Comment: Performed at [facility], [place] [address] [place],  [place] [address]   Dg Chest 2 View  Result Date: 11/09/2018 CLINICAL DATA:  Recent fall EXAM: CHEST - 2 VIEW COMPARISON:  PET-CT from 09/06/2018 FINDINGS: Cardiac shadow is stable. Aortic calcifications are seen. Postsurgical changes at the cervicothoracic junction are seen. Chronic changes in the right upper lobe are noted consistent with prior surgery and treatment. Right-sided pleural effusion is again identified and stable. No acute bony abnormality is noted. IMPRESSION: Stable changes in the right hemithorax consistent with prior surgery and treatment. Pleural effusion is again noted on the right stable from previous exam. Electronically Signed   By: Inez Catalina M.D.   On: 11/09/2018 12:33   Dg Lumbar Spine Complete  Result Date: 11/09/2018 CLINICAL DATA:  Recent fall with low back pain, initial encounter EXAM: LUMBAR SPINE - COMPLETE 4+ VIEW COMPARISON:  07/15/2018 FINDINGS: Five lumbar type vertebral bodies are well visualized. Mild scoliosis concave to the right is seen. Vertebral body height is well maintained. There are areas of  sclerosis noted within the L3 and L4 vertebral bodies similar to that noted on prior PET-CT. Similar changes are noted scattered throughout the pelvic bones. No anterolisthesis is noted. Rim calcified splenic cyst is noted in the left upper quadrant. IMPRESSION: Sclerotic foci within the lumbar spine and pelvic bones consistent with the known history of prior metastatic disease. No acute compression deformity is seen. Rim calcified splenic cyst in the left upper quadrant. Electronically Signed   By: Inez Catalina M.D.   On: 11/09/2018 12:32   Ct Head Wo Contrast  Result Date: 11/09/2018 CLINICAL DATA:  Recent fall, initial encounter EXAM: CT HEAD WITHOUT CONTRAST TECHNIQUE: Contiguous axial images were obtained from the base of the skull through the vertex without intravenous contrast. COMPARISON:  08/25/2016 FINDINGS: Brain: There are small subdural hematomas along the left half of the tentorium cerebelli (3 mm in thickness) and to the left of the falx cerebri (4 mm in thickness). Additional small subdural hematomas are noted in the right parietal region and right frontal best seen on the coronal reconstructions. It measures 3 mm in thickness as well. Small areas of subarachnoid hemorrhage are noted with some parenchymal contusion in the right frontal lobe as well as within the right temporal lobe. Vascular: No hyperdense vessel or unexpected calcification. Skull: Undisplaced fracture is noted through the left occipital bone coursing adjacent to the foramen magnum. Old nasal bone fractures are noted bilaterally. Sinuses/Orbits: Within normal limits. Other: Scalp hematoma is noted over the left occipital fracture. IMPRESSION: Areas of subarachnoid hemorrhage in the right frontal and right temporal lobes. These are likely related to coup-contrecoup mechanism from the left occipital injury. Multiple small subdural hematomas along the left tentorium cerebelli as well as just to the left of the midline along the falx  near the vertex posteriorly as well as a small right parietal and right frontal lobe subdural hematomas. These measure 3-4 mm in thickness at each location in cause no significant mass effect at this time. Left occipital scalp hematoma with underlying occipital bone fracture. No significant displacement of the fracture is seen. Critical Value/emergent results were called by telephone at the time of interpretation on 11/09/2018 at 12:29 pm to Dr. Lennice Sites , who verbally acknowledged these results. Electronically Signed   By: Inez Catalina M.D.   On: 11/09/2018 12:29   Vas US Carotid  Result Date: 11/09/2018 Carotid Arterial Duplex Study Indications:       Syncope. Risk Factors:      Diabetes. Comparison  Study:  No prior studies. Performing Technologist: Oliver Hum RVT  Examination Guidelines: A complete evaluation includes B-mode imaging, spectral Doppler, color Doppler, and power Doppler as needed of all accessible portions of each vessel. Bilateral testing is considered an integral part of a complete examination. Limited examinations for reoccurring indications may be performed as noted.  Right Carotid Findings: +----------+--------+--------+--------+-----------------------+--------+           PSV cm/sEDV cm/sStenosisDescribe               Comments +----------+--------+--------+--------+-----------------------+--------+ CCA Prox  82      8                                      tortuous +----------+--------+--------+--------+-----------------------+--------+ CCA Distal68      12              smooth and heterogenous         +----------+--------+--------+--------+-----------------------+--------+ ICA Prox  81      20              smooth and heterogenous         +----------+--------+--------+--------+-----------------------+--------+ ICA Distal64      14                                              +----------+--------+--------+--------+-----------------------+--------+  ECA       103     9                                               +----------+--------+--------+--------+-----------------------+--------+ +----------+--------+-------+--------+-------------------+           PSV cm/sEDV cmsDescribeArm Pressure (mmHG) +----------+--------+-------+--------+-------------------+ [id number]                                        +----------+--------+-------+--------+-------------------+ +---------+--------+--+--------+--+---------+ VertebralPSV cm/s52EDV cm/s11Antegrade +---------+--------+--+--------+--+---------+  Left Carotid Findings: +----------+--------+--------+--------+-----------------------+--------+           PSV cm/sEDV cm/sStenosisDescribe               Comments +----------+--------+--------+--------+-----------------------+--------+ CCA Prox  82      11              smooth and heterogenoustortuous +----------+--------+--------+--------+-----------------------+--------+ CCA Distal65      7               smooth and heterogenous         +----------+--------+--------+--------+-----------------------+--------+ ICA Prox  69      14              smooth and heterogenous         +----------+--------+--------+--------+-----------------------+--------+ ICA Distal68      19                                     tortuous +----------+--------+--------+--------+-----------------------+--------+ ECA       93      7                                               +----------+--------+--------+--------+-----------------------+--------+ +----------+--------+--------+--------+-------------------+  SubclavianPSV cm/sEDV cm/sDescribeArm Pressure (mmHG) +----------+--------+--------+--------+-------------------+           119                                         +----------+--------+--------+--------+-------------------+ +---------+--------+--+--------+-+---------+ VertebralPSV cm/s44EDV cm/s8Antegrade  +---------+--------+--+--------+-+---------+  Summary: Right Carotid: Velocities in the right ICA are consistent with a 1-39% stenosis. Left Carotid: Velocities in the left ICA are consistent with a 1-39% stenosis. Vertebrals: Bilateral vertebral arteries demonstrate antegrade flow. *See table(s) above for measurements and observations.  Electronically signed by Servando Snare MD on 11/09/2018 at 4:09:43 PM.    Final     Pending Labs Unresulted Labs (From admission, onward)    Start     Ordered   11/10/18 8882  Basic metabolic panel  Tomorrow morning,   R     11/09/18 1357   11/10/18 0500  CBC  Tomorrow morning,   R     11/09/18 1357   Signed and Held  SARS Coronavirus 2 (Performed in [facility] hospital lab)  (COVID Labs)  Once,   R    Question:  Pre-procedural testing  Answer:  Yes   Signed and Held          Vitals/Pain Today's Vitals   11/09/18 1400 11/09/18 1517 11/09/18 1530 11/09/18 1600  BP: (!) 142/89 (!) 141/62 (!) 144/67 (!) 143/66  Pulse: 100 96 99 94  Resp: (!) 21 (!) 22 (!) 23 (!) 23  Temp:      TempSrc:      SpO2: 97% 94% 94% 94%    Isolation Precautions No active isolations  Medications Medications  acetaminophen (TYLENOL) tablet 650 mg (has no administration in time range)    Or  acetaminophen (TYLENOL) suppository 650 mg (has no administration in time range)  traZODone (DESYREL) tablet 25 mg (has no administration in time range)  senna (SENOKOT) tablet 8.6 mg (has no administration in time range)  polyethylene glycol (MIRALAX / GLYCOLAX) packet 17 g (has no administration in time range)  ondansetron (ZOFRAN) tablet 4 mg (has no administration in time range)    Or  ondansetron (ZOFRAN) injection 4 mg (has no administration in time range)  potassium chloride SA (K-DUR) CR tablet 40 mEq (has no administration in time range)  sodium chloride 0.9 % bolus 1,000 mL (0 mLs Intravenous Stopped 11/09/18 1217)  meclizine (ANTIVERT) tablet 25 mg (25 mg Oral Given  11/09/18 1126)  prochlorperazine (COMPAZINE) injection 10 mg (10 mg Intravenous Given 11/09/18 1128)    Mobility walks with person assist High fall risk   Focused Assessments Neuro Assessment Handoff:  Swallow screen pass? Yes  Cardiac Rhythm: Normal sinus rhythm       Neuro Assessment: Within Defined Limits Neuro Checks:      Last Documented NIHSS Modified Score:   Has TPA been given? No If patient is a Neuro Trauma and patient is going to OR before floor call report to [place] nurse: [phone] or [phone]     R Recommendations: See Admitting Provider Note  Report given to:   Additional Notes:

## 2018-11-09 NOTE — ED Triage Notes (Signed)
Pt fell this morning, witnessed by lawn maintenance, pt reports she felt dizzy prior to fall and then states she woke up and continued with dizziness. Pt with emesis since this morning. Pt also c/o chronic lower back pain that worsened with the fall. BS by EMS was 225. Pt is diabetic and has not had any of her medications this morning.

## 2018-11-09 NOTE — Progress Notes (Signed)
Carotid artery duplex has been completed. Preliminary results can be found in CV Proc through chart review.   11/09/18 2:49 PM Samantha Quinn RVT

## 2018-11-09 NOTE — H&P (Addendum)
History and Physical    Samantha Quinn KDT:[id number] DOB: Apr 06, 1928 DOA: 11/09/2018  PCP: Binnie Rail, MD Patient coming from: home.  Independent.  Chief Complaint: "Dizzy"  HPI: Samantha Quinn is a 85 y.o. female with history of lung cancer with metastasis to bone, NIDDM-2, HTN, anxiety, hypothyroidism and chronic "dizziness" presenting after fall this morning.  Patient has no recollection of fall but remembers feeling dizzy that she describes as lightheadedness.  She says she has history of vertigo but describes her dizziness as lightheadedness not spinning.  She is not on blood thinner.  She says she feels dizzy when she turns her head.  Fall is witnessed by Fish farm manager who called EMS.  Denies feeling groggy after fall.  She denies recent illness, URI symptoms, fever, chills, palpitation, chest pain, dyspnea, abdominal pain, urinary symptoms or focal neuro symptoms.  She admits to associated nausea and episode of emesis.  She denies new medication, known COVID-19 exposure or any other sick contacts.  She admits to chronic cough unchanged from baseline.  Lives alone.  Ambulates independently.  Denies smoking cigarettes, drinking alcohol recreational drug use.  In ED, vital signs stable.  BC remarkable for leukocytosis to 17 with bandemia and lymphopenia.  CMP remarkable for hypokalemia to 3.2 otherwise not impressive.  Urinalysis not impressive.  CXR without acute finding but right hemithorax from prior surgery and stable right pleural effusion.  EKG normal sinus rhythm.  PT/INR within normal range.  COVID-19 negative.  CT head with subarachnoid hemorrhage, multiple small subdural hematomas without mass-effect, left occipital scalp hematom with underlying occipital bone fracture but no significant displacement.  Case discussed with neurosurgery, Dr. Arnoldo Morale by EDP who recommended admission and repeat CT head in the morning.  Received normal saline bolus, meclizine and Compazine  and admitted.   ROS All review of system negative except for pertinent positives and negatives as history of present illness above.  PMH Past Medical History:  Diagnosis Date   Anxiety    Back pain    bulding disc   Bronchitis    hx of-many,many years    Diabetes mellitus    Diabetes mellitus    type 2 and takes Metformin bid   Dyslipidemia    Embolism - blood clot    hx of in left lower leg and was on Coumadin for about 109month;this was about 8-129yrago   Family history of adverse reaction to anesthesia    Daughter- Nausea   Goals of care, counseling/discussion 11/12/2017   History of colonic polyps    History of radiation therapy 01/06/16-02/19/16   The primary tumor and involved mediastinal adenopathy were treated to 66 Gy in 33 fractions of 2 Gy.   History of radiation therapy 04/01/17-04/26/17   cervical spine 35 Gy in 14 fractions   HOH (hard of hearing)    Hx of migraines    as a teenager   Hypercholesterolemia    Hyperlipidemia    takes Lovastatin every other day   Hypertension    takes Ziac daily and Ramipril as well   Hyperthyroidism    Hypothyroidism    takes Synthroid every other day   Insomnia    takes Elavil nightly   Joint pain    Lung mass    Myalgia    Osteoarthritis    Osteoporosis    Pneumothorax of right lung after biopsy 07/09/11   HAD CHEST TUBE PLACEMENT   PONV (postoperative nausea and vomiting)    Primary adenocarcinoma  of lower lobe of right lung ([place]) 06/24/2011   pT2a, pN0 M0 Stage 1 Well differenced Adenocarcinoma 3.5 cm resected 07/17/2011   Primary adenocarcinoma of lung ([place]) 06/24/2011   Shortness of breath dyspnea    With exertion   Skin cancer    legs have dark spots on them   Upper respiratory infection 04/2011   Urinary incontinence    takes Detrol daily prn   PSH Past Surgical History:  Procedure Laterality Date   ABDOMINAL HYSTERECTOMY     ANTERIOR CERVICAL DECOMP/DISCECTOMY FUSION N/A  02/17/2017   Procedure: ANTERIOR CERVICAL DECOMPRESSION FUSION, CERVICAL 6-7 WITH INSTRUMENTATION AND ALLOGRAFT;  Surgeon: Phylliss Bob, MD;  Location: [place];  Service: Orthopedics;  Laterality: N/A;  ANTERIOR CERVICAL DECOMPRESSION FUSION, CERVICAL 6-7 WITH INSTRUMENTATION AND ALLOGRAFT; REQUEST 2 HOURS AND FLIP    APPENDECTOMY     as a child   CHEST TUBE REMOVAL  07/13/11   RIGHT  CHEST TUBE   CHOLECYSTECTOMY     COLONOSCOPY     COLONOSCOPY     DILATION AND CURETTAGE OF UTERUS     x 2   EYE SURGERY Bilateral    Cataract   HEMORRHOID SURGERY     IR THORACENTESIS ASP PLEURAL SPACE W/IMG GUIDE  02/03/2018   LUNG BIOPSY  07/09/11   RIGHT UPER LOBE LUNG MASS    LUNG LOBECTOMY Right 06/2011   POSTERIOR CERVICAL FUSION/FORAMINOTOMY N/A 02/18/2017   Procedure: CERVICAL Five-Six, CERVICAL Six-Seven, CERVICAL Seven-THORACIC One, THORACIC One-Two POSTERIOR SPINAL FUSION WITH INSTRUMENTATION AND ALLOGRAFT;  Surgeon: Phylliss Bob, MD;  Location: [place];  Service: Orthopedics;  Laterality: N/A;   RIGHT CHEST TUBE PLACEMENT  07/10/11   S/P LUNG BX   ROTATOR CUFF REPAIR     bilateral   TOE SURGERY  2013   right small toe   TONSILLECTOMY     as a child   VIDEO BRONCHOSCOPY  07/17/2011   Procedure: VIDEO BRONCHOSCOPY;  Surgeon: Grace Isaac, MD;  Location: [place];  Service: Thoracic;  Laterality: N/A;   VIDEO BRONCHOSCOPY WITH ENDOBRONCHIAL ULTRASOUND N/A 12/10/2015   Procedure: VIDEO BRONCHOSCOPY WITH ENDOBRONCHIAL ULTRASOUND with biopsies.;  Surgeon: Grace Isaac, MD;  Location: [facility];  Service: Thoracic;  Laterality: N/A;   Fam HX Family History  Problem Relation Age of Onset   Hypertension Mother    Hypertension Father    Arthritis Sister    Arthritis Sister    Diabetes Brother    Hypertension Sister    Anesthesia problems Neg Hx    Hypotension Neg Hx    Malignant hyperthermia Neg Hx    Pseudochol deficiency Neg Hx    Heart attack Neg Hx      Social Hx  reports that she has never smoked. She has never used smokeless tobacco. She reports that she does not drink alcohol or use drugs.  Allergy Allergies  Allergen Reactions   Ramipril Other (See Comments)    Tongue swelling   Tramadol Other (See Comments)    syncope syncope   Codeine Nausea And Vomiting   Demerol Nausea Only   Home Meds Prior to Admission medications   Medication Sig Start Date End Date Taking? Authorizing Provider  amitriptyline (ELAVIL) 10 MG tablet TAKE 1 TABLET BY MOUTH EVERYDAY AT BEDTIME 05/10/17   Burns, Claudina Lick, MD  amLODipine (NORVASC) 5 MG tablet Take 1 tablet (5 mg total) by mouth daily. Patient taking differently: Take 5 mg by mouth every other day.  07/07/18  Binnie Rail, MD  bisoprolol-hydrochlorothiazide [facility]) 2.5-6.25 MG tablet TAKE 1 TABLET BY MOUTH EVERY DAY 06/14/18   Binnie Rail, MD  Blood Glucose Monitoring Suppl (ONE TOUCH ULTRA 2) w/Device KIT Use to check blood sugars twice a day Dx E11.9 01/07/16   Binnie Rail, MD  calcium carbonate (OS-CAL) 600 MG tablet Take 600 mg by mouth daily.    [provider]  cholecalciferol (VITAMIN D) 1000 units tablet Take 2,000 Units by mouth daily.     [provider]  gabapentin (NEURONTIN) 100 MG capsule Take 100 mg by mouth daily.     [provider]  glucose blood (ONE TOUCH ULTRA TEST) test strip 1 each by Other route 2 (two) times daily. Use to check blood sugars twice a day 07/31/16   Binnie Rail, MD  HYDROcodone-homatropine [facility]) 5-1.5 MG/5ML syrup Take 5 mLs by mouth every 6 (six) hours as needed for cough. 01/20/18   [name], MD  Lancets [facility] ULTRASOFT) lancets 1 each by Other route 2 (two) times daily. Use to help check blood sugars twice a day 07/31/16   Binnie Rail, MD  lovastatin (MEVACOR) 20 MG tablet TAKE 1 TABLET BY MOUTH EVERY OTHER DAY 09/29/18   Binnie Rail, MD  metFORMIN (GLUCOPHAGE) 500 MG tablet Take 1 tablet (500 mg  total) by mouth 2 (two) times daily with a meal. 08/04/18   Burns, Claudina Lick, MD  methimazole (TAPAZOLE) 5 MG tablet Take 2.5 mg every other day 07/12/18   Philemon Kingdom, MD  Multiple Vitamins-Minerals (VISION-VITE PRESERVE PO) Take 1 tablet by mouth daily.     [provider]  Probiotic Product (ALIGN PO) Take by mouth daily.    [provider]  prochlorperazine (COMPAZINE) 10 MG tablet Take 1 tablet (10 mg total) by mouth every 6 (six) hours as needed (Nausea or vomiting). 12/30/17 05/06/18  [name], MD    Physical Exam: Vitals:   11/09/18 1045 11/09/18 1046 11/09/18 1125 11/09/18 1211  BP: (!) 150/84 (!) 150/84 (!) 156/71 (!) 151/70  Pulse: 89 94 87 87  Resp:  '14 14 19  ' Temp:  97.8 F (36.6 C)    TempSrc:  Oral    SpO2: 97% 96% 97% 97%    GENERAL: No acute distress.  Appears well.  HEENT: MMM.  PERRLA.  EOMI.  Some horizontal nystagmus.  Vision grossly intact.  Some difficulty hearing.  Scalp hematoma over occipital area NECK: Supple.  No apparent JVD.  RESP:  No IWOB. Good air movement bilaterally. CVS:  RRR. Heart sounds normal.  ABD/GI/GU: Bowel sounds present. Soft. Non tender.  MSK/EXT:  Moves extremities. No apparent deformity or edema.  SKIN: no apparent skin lesion or wound NEURO: Awake, alert and oriented appropriately. Cranial nerves II-XII intact, motor 5/5 in all muscle groups of UE and LE bilaterally, normal tone, light sensation intact in all dermatomes of upper and lower ext bilaterally, no pronator drift, biceps, patellar reflexes symmetric, finger to nose intact, gait deferred. PSYCH: Calm. Normal affect.  ENDO: No thyromegaly  Personally Reviewed Radiological Exams Dg Chest 2 View  Result Date: 11/09/2018 CLINICAL DATA:  Recent fall EXAM: CHEST - 2 VIEW COMPARISON:  PET-CT from 09/06/2018 FINDINGS: Cardiac shadow is stable. Aortic calcifications are seen. Postsurgical changes at the cervicothoracic junction are seen. Chronic changes in the  right upper lobe are noted consistent with prior surgery and treatment. Right-sided pleural effusion is again identified and stable. No acute bony  abnormality is noted. IMPRESSION: Stable changes in the right hemithorax consistent with prior surgery and treatment. Pleural effusion is again noted on the right stable from previous exam. Electronically Signed   By: Inez Catalina M.D.   On: 11/09/2018 12:33   Dg Lumbar Spine Complete  Result Date: 11/09/2018 CLINICAL DATA:  Recent fall with low back pain, initial encounter EXAM: LUMBAR SPINE - COMPLETE 4+ VIEW COMPARISON:  07/15/2018 FINDINGS: Five lumbar type vertebral bodies are well visualized. Mild scoliosis concave to the right is seen. Vertebral body height is well maintained. There are areas of sclerosis noted within the L3 and L4 vertebral bodies similar to that noted on prior PET-CT. Similar changes are noted scattered throughout the pelvic bones. No anterolisthesis is noted. Rim calcified splenic cyst is noted in the left upper quadrant. IMPRESSION: Sclerotic foci within the lumbar spine and pelvic bones consistent with the known history of prior metastatic disease. No acute compression deformity is seen. Rim calcified splenic cyst in the left upper quadrant. Electronically Signed   By: Inez Catalina M.D.   On: 11/09/2018 12:32   Ct Head Wo Contrast  Result Date: 11/09/2018 CLINICAL DATA:  Recent fall, initial encounter EXAM: CT HEAD WITHOUT CONTRAST TECHNIQUE: Contiguous axial images were obtained from the base of the skull through the vertex without intravenous contrast. COMPARISON:  08/25/2016 FINDINGS: Brain: There are small subdural hematomas along the left half of the tentorium cerebelli (3 mm in thickness) and to the left of the falx cerebri (4 mm in thickness). Additional small subdural hematomas are noted in the right parietal region and right frontal best seen on the coronal reconstructions. It measures 3 mm in thickness as well. Small areas of  subarachnoid hemorrhage are noted with some parenchymal contusion in the right frontal lobe as well as within the right temporal lobe. Vascular: No hyperdense vessel or unexpected calcification. Skull: Undisplaced fracture is noted through the left occipital bone coursing adjacent to the foramen magnum. Old nasal bone fractures are noted bilaterally. Sinuses/Orbits: Within normal limits. Other: Scalp hematoma is noted over the left occipital fracture. IMPRESSION: Areas of subarachnoid hemorrhage in the right frontal and right temporal lobes. These are likely related to coup-contrecoup mechanism from the left occipital injury. Multiple small subdural hematomas along the left tentorium cerebelli as well as just to the left of the midline along the falx near the vertex posteriorly as well as a small right parietal and right frontal lobe subdural hematomas. These measure 3-4 mm in thickness at each location in cause no significant mass effect at this time. Left occipital scalp hematoma with underlying occipital bone fracture. No significant displacement of the fracture is seen. Critical Value/emergent results were called by telephone at the time of interpretation on 11/09/2018 at 12:29 pm to Dr. Lennice Sites , who verbally acknowledged these results. Electronically Signed   By: Inez Catalina M.D.   On: 11/09/2018 12:29     Personally Reviewed Labs: CBC: Recent Labs  Lab 11/09/18 1106  WBC 17.3*  NEUTROABS 16.1*  HGB 12.2  HCT 37.6  MCV 94.2  PLT 027   Basic Metabolic Panel: Recent Labs  Lab 11/09/18 1106  NA 137  K 3.2*  CL 102  CO2 23  GLUCOSE 208*  BUN 18  CREATININE 0.58  CALCIUM 8.9   GFR: CrCl cannot be calculated (Unknown ideal weight.). Liver Function Tests: Recent Labs  Lab 11/09/18 1106  AST 25  ALT 13  ALKPHOS 137*  BILITOT 0.4  PROT 7.1  ALBUMIN 3.9   No results for input(s): LIPASE, AMYLASE in the last 168 hours. No results for input(s): AMMONIA in the last 168  hours. Coagulation Profile: No results for input(s): INR, PROTIME in the last 168 hours. Cardiac Enzymes: No results for input(s): CKTOTAL, CKMB, CKMBINDEX, TROPONINI in the last 168 hours. BNP (last 3 results) No results for input(s): PROBNP in the last 8760 hours. HbA1C: No results for input(s): HGBA1C in the last 72 hours. CBG: No results for input(s): GLUCAP in the last 168 hours. Lipid Profile: No results for input(s): CHOL, HDL, LDLCALC, TRIG, CHOLHDL, LDLDIRECT in the last 72 hours. Thyroid Function Tests: No results for input(s): TSH, T4TOTAL, FREET4, T3FREE, THYROIDAB in the last 72 hours. Anemia Panel: No results for input(s): VITAMINB12, FOLATE, FERRITIN, TIBC, IRON, RETICCTPCT in the last 72 hours. Urine analysis:    Component Value Date/Time   COLORURINE STRAW (A) 11/09/2018 1227   APPEARANCEUR CLEAR 11/09/2018 1227   LABSPEC 1.009 11/09/2018 1227   PHURINE 7.0 11/09/2018 1227   GLUCOSEU 50 (A) 11/09/2018 1227   HGBUR NEGATIVE 11/09/2018 1227   BILIRUBINUR NEGATIVE 11/09/2018 1227   KETONESUR NEGATIVE 11/09/2018 1227   PROTEINUR NEGATIVE 11/09/2018 1227   UROBILINOGEN 0.2 07/16/2011 0947   NITRITE NEGATIVE 11/09/2018 1227   LEUKOCYTESUR NEGATIVE 11/09/2018 1227    Sepsis Labs:  Leukocytosis with bandemia  Personally Reviewed EKG:  EKG normal sinus rhythm without acute finding.  Assessment/Plan Syncopal episode/ground-level fall at home Subarachnoid hemorrhage and multiple subdural hematoma without mass-effect Occipital skull fracture with significant displacement -Symptoms consistent with syncope versus vertigo.  Seems to be chronic issue. -Also on low-dose amitriptyline and gabapentin which could contribute. -No focal neuro deficit to suggest CVA. -PT/INR within normal range. -We will monitor on telemetry, carotid ultrasound and echocardiogram -Neurovascular check every 4 hours -Repeat CT without contrast in the morning per neurosurgery, Dr.  Arnoldo Morale -Check TSH -Orthostatic vitals -SCD for VT prophylaxis. -PT/OT  Hypokalemia: K3.2.  On HCTZ at home which could contribute. -Replenish and recheck -Hold home HCTZ  Leukocytosis: Likely due to marginalization versus infectious process. -Continue trending  NIDDM-2: On metformin at home. -Sliding scale insulin and CBG monitoring -Continue low-dose gabapentin  Stage IV adenocarcinoma of right lung with metastasis to bone and C6-7 left nerve root compression.  Followed by Dr. Marin Olp.  Discussed with Dr. Irene Limbo, oncologist on-call who will forwarded to Dr. Marin Olp  -Per oncology -May consider MRI brain to exclude brain mets.  Hypertension: BP was in fair range. -Continue home amlodipine -Hold HCTZ -Orthostatic vitals  Hypothyroidism -Check TSH -Hold home methimazole  Anxiety: Stable  DVT prophylaxis: SCD in the setting of intracranial bleed  Code Status: DNR/DNI-confirmed with patient and daughter Family Communication: Updated patient's daughter over the phone  Disposition Plan: Admit to telemetry bed at [employer] called: Neurosurgery by EDP.  Oncology by me Admission status: Inpatient  Severity of Illness: The appropriate patient status for this patient is INPATIENT. Inpatient status is judged to be reasonable and necessary in order to provide the required intensity of service to ensure the patient's safety. The patient's presenting symptoms, physical exam findings, and initial radiographic and laboratory data in the context of their chronic comorbidities is felt to place them at high risk for further clinical deterioration. Furthermore, it is not anticipated that the patient will be medically stable for discharge from the hospital within 2 midnights of admission. The following factors support the patient status of inpatient.    "  The patient's presenting symptoms include syncope, fall "           The worrisome physical exam findings include hematoma "            The initial radiographic and laboratory data are worrisome because hypokalemia, leukocytosis, subarachnoid hemorrhage, multiple subdural hematoma, skull fracture and scalp hematoma "           The chronic co-morbidities include metastatic lung cancer, diabetes, hypertension, hypothyroidism     I certify that at the point of admission it is my clinical judgment that the patient will require inpatient hospital care spanning beyond 2 midnights from the point of admission due to high intensity of service, high risk for further deterioration and high frequency of surveillance required. Mercy Riding MD [facility]  If 7PM-7AM, please contact night-coverage [email or web address] Password [facility]  11/09/2018, 1:59 PM

## 2018-11-09 NOTE — Telephone Encounter (Signed)
Daughter calling office with report of patient falling this am. She was found outside by maintenance and helped to a chair. The daughter reports the patients temp is low, she feels clammy and complains of nausea. The patient doesn't remember the fall. She is talking and seems to be coherent. There is no signs of stroke.   Recommended that she bring patient to the ED for workup of possible syncopal episode and unwitnessed fall. She agrees and will bring patient to the [facility] ED.   Laverna Peace NP notified and agrees with plan.

## 2018-11-10 ENCOUNTER — Inpatient Hospital Stay (HOSPITAL_COMMUNITY): Payer: Medicare Other

## 2018-11-10 ENCOUNTER — Encounter (HOSPITAL_COMMUNITY): Payer: Self-pay | Admitting: *Deleted

## 2018-11-10 ENCOUNTER — Other Ambulatory Visit: Payer: Self-pay

## 2018-11-10 DIAGNOSIS — C3431 Malignant neoplasm of lower lobe, right bronchus or lung: Secondary | ICD-10-CM

## 2018-11-10 DIAGNOSIS — D649 Anemia, unspecified: Secondary | ICD-10-CM

## 2018-11-10 DIAGNOSIS — E875 Hyperkalemia: Secondary | ICD-10-CM

## 2018-11-10 DIAGNOSIS — D72829 Elevated white blood cell count, unspecified: Secondary | ICD-10-CM

## 2018-11-10 LAB — BASIC METABOLIC PANEL
Anion gap: 13 (ref 5–15)
BUN: 12 mg/dL (ref 8–23)
CO2: 18 mmol/L — ABNORMAL LOW (ref 22–32)
Calcium: 8.5 mg/dL — ABNORMAL LOW (ref 8.9–10.3)
Chloride: 104 mmol/L (ref 98–111)
Creatinine, Ser: 0.72 mg/dL (ref 0.44–1.00)
GFR calc Af Amer: >60 mL/min (ref >60)
GFR calc non Af Amer: >60 mL/min (ref >60)
Glucose, Bld: 249 mg/dL — ABNORMAL HIGH (ref 70–99)
Potassium: 5.6 mmol/L — ABNORMAL HIGH (ref 3.5–5.1)
Sodium: 135 mmol/L (ref 135–145)

## 2018-11-10 LAB — CBC
HCT: 36.4 % (ref 36.0–46.0)
Hemoglobin: 11.9 g/dL — ABNORMAL LOW (ref 12.0–15.0)
MCH: 30.2 pg (ref 26.0–34.0)
MCHC: 32.7 g/dL (ref 30.0–36.0)
MCV: 92.4 fL (ref 80.0–100.0)
Platelets: 120 10*3/uL — ABNORMAL LOW (ref 150–400)
RBC: 3.94 MIL/uL (ref 3.87–5.11)
RDW: 14.4 % (ref 11.5–15.5)
WBC: 11.9 10*3/uL — ABNORMAL HIGH (ref 4.0–10.5)
nRBC: 0 % (ref 0.0–0.2)

## 2018-11-10 LAB — GLUCOSE, CAPILLARY
Glucose-Capillary: 111 mg/dL — ABNORMAL HIGH (ref 70–99)
Glucose-Capillary: 147 mg/dL — ABNORMAL HIGH (ref 70–99)
Glucose-Capillary: 126 mg/dL — ABNORMAL HIGH (ref 70–99)

## 2018-11-10 MED ORDER — SODIUM ZIRCONIUM CYCLOSILICATE 10 G PO PACK
10.0000 g | PACK | Freq: Once | ORAL | Status: AC
  Administered 2018-11-10: 10 g via ORAL
  Filled 2018-11-10: qty 1

## 2018-11-10 MED ORDER — SENNA 8.6 MG PO TABS: 1.0000 | ORAL_TABLET | Freq: Two times a day (BID) | ORAL | 0 refills | Status: AC

## 2018-11-10 MED ORDER — POLYETHYLENE GLYCOL 3350 17 G PO PACK: 17.0000 g | PACK | Freq: Every day | ORAL | 0 refills | Status: AC | PRN

## 2018-11-10 NOTE — Progress Notes (Addendum)
HEMATOLOGY-ONCOLOGY PROGRESS NOTE  SUBJECTIVE: Samantha Quinn is currently admitted following a fall accompanied by dizziness.  She was amnestic prior to the event and was not sure if she hit her head.  A CT scan of the head revealed multiple small subdural hematomas along the left tentorium, a small right parietal right frontal lobe subdural hematoma, and areas of subarachnoid hemorrhage in the right frontal and right temporal lobes.  There was no significant mass-effect at the time of initial scan.  A left occipital scalp hematoma with underlying nondisplaced occipital bone fracture was also noted on the CT.  Lumbar CT was negative.  She is currently followed by Dr. Marin Olp for non-small cell lung cancer, adenocarcinoma.  Her current treatment consists of only Zometa.  The patient reports that she has some intermittent dizziness today. No headaches. Denies chest pain, shortness of breath, abdominal pain, nausea, and vomiting. Has been seen by Neurosurgery and repeat CT is stable. They advised that patient may discharge today with outpatient follow-up. Echocardiogram has been ordered and is pending.   Oncology History Overview Note  Patient followed due to HX NSCLC adenocarcinoma s/p resection 06/2011 3.5cm with EGFR/ALK negative   Primary adenocarcinoma of lung ([place])   Staging form: Lung, AJCC 7th Edition   - Clinical stage from 12/19/2015: Stage IIA (T1b, N1, M0) - Signed by Curt Bears, MD on 12/19/2015    Primary adenocarcinoma of lower lobe of right lung ([place])  12/30/2017 - 05/01/2018 Chemotherapy   The patient had PEMEtrexed (ALIMTA) 600 mg in sodium chloride 0.9 % 100 mL chemo infusion, 400 mg/m2 = 600 mg (80 % of original dose 500 mg/m2), Intravenous,  Once, 5 of 6 cycles Dose modification: 400 mg/m2 (80 % of original dose 500 mg/m2, Cycle 1, Reason: Provider Judgment) Administration: 600 mg (12/30/2017), 600 mg (02/17/2018), 600 mg (03/10/2018), 600 mg (01/20/2018), 600 mg (03/31/2018)  for  chemotherapy treatment.    12/30/2017 - 05/01/2018 Chemotherapy   The patient had bevacizumab (AVASTIN) 800 mg in sodium chloride 0.9 % 100 mL chemo infusion, 14.5 mg/kg = 825 mg, Intravenous,  Once, 5 of 7 cycles Administration: 800 mg (12/30/2017), 800 mg (01/20/2018), 800 mg (02/17/2018), 800 mg (03/10/2018), 800 mg (03/31/2018)  for chemotherapy treatment.    Lung mass (Resolved)  11/28/2015 Imaging   CT Chest IMPRESSION:  New soft tissue masses in the right lower lobe as described   12/06/2015 Imaging   PET IMPRESSION: The perihilar right lower lobe mass is hypermetabolic with maximum standard uptake value of 12.0, compatible with malignancy   12/10/2015 Surgery   Bronchoscopy with endobronchial ultrasound and transbronchial biopsies and biopsies of the bronchial stump.   12/13/2015 Initial Diagnosis   Lung mass   12/20/2015 -  Radiation Therapy   SIM    12/26/2015 Imaging   MRI Brain IMPRESSION: Stable since 2013 and normal for age MRI appearance of the brain. No metastatic disease or acute intracranial abnormality.   01/06/2016 -  Chemotherapy   The patient had palonosetron (ALOXI) injection 0.25 mg, 0.25 mg, Intravenous,  Once, 1 of 7 cycles  CARBOplatin (PARAPLATIN) 130 mg in sodium chloride 0.9 % 100 mL chemo infusion, 130 mg (100 % of original dose 126.8 mg), Intravenous,  Once, 1 of 7 cycles Dose modification: 126.8 mg (original dose 126.8 mg, Cycle 1)  PACLitaxel (TAXOL) 72 mg in sodium chloride 0.9 % 250 mL chemo infusion ( for chemotherapy treatment.        REVIEW OF SYSTEMS:   Constitutional: Denies fevers,  chills Respiratory: Denies cough, dyspnea or wheezes Cardiovascular: Denies palpitation, chest discomfort Gastrointestinal:  Denies nausea, heartburn or change in bowel habits Skin: Denies abnormal skin rashes Lymphatics: Denies new lymphadenopathy or easy bruising Neurological:Reports intermittent dizziness. No headaches.  Extremities: No lower extremity  edema All other systems were reviewed with the patient and are negative.  I have reviewed the past medical history, past surgical history, social history and family history with the patient and they are unchanged from previous note.   PHYSICAL EXAMINATION:  Vitals:   11/10/18 0900 11/10/18 1154  BP: 122/63 106/67  Pulse: (!) 102 96  Resp: 18 18  Temp: 98.7 F (37.1 C) 98.7 F (37.1 C)  SpO2: 96% 96%   Filed Weights   11/10/18 0100  Weight: 115 lb 15.4 oz (52.6 kg)    Intake/Output from previous day: 07/15 0701 - 07/16 0700 In: -  Out: 600 [Urine:600]  GENERAL:alert, no distress and comfortable SKIN: skin color, texture, turgor are normal, no rashes or significant lesions EYES: normal, Conjunctiva are pink and non-injected, sclera clear OROPHARYNX:no exudate, no erythema and lips, buccal mucosa, and tongue normal  NECK: supple, thyroid normal size, non-tender, without nodularity LYMPH:  no palpable lymphadenopathy in the cervical, axillary or inguinal LUNGS: clear to auscultation and percussion with normal breathing effort HEART: regular rate & rhythm and no murmurs and no lower extremity edema ABDOMEN:abdomen soft, non-tender and normal bowel sounds Musculoskeletal:no cyanosis of digits and no clubbing  NEURO: alert & oriented x 3 with fluent speech, no focal motor/sensory deficits  LABORATORY DATA:  I have reviewed the data as listed CMP Latest Ref Rng & Units 11/10/2018 11/09/2018 10/27/2018  Glucose 70 - 99 mg/dL 249(H) 208(H) 101(H)  BUN 8 - 23 mg/dL _0 Creatinine 0.44 - 1.00 mg/dL 0.72 0.58 0.62  Sodium 135 - 145 mmol/L 135 137 138  Potassium 3.5 - 5.1 mmol/L 5.6(H) 3.2(L) 4.4  Chloride 98 - 111 mmol/L 104 102 100  CO2 22 - 32 mmol/L 18(L) 23 28  Calcium 8.9 - 10.3 mg/dL 8.5(L) 8.9 9.8  Total Protein 6.5 - 8.1 g/dL - 7.1 6.8  Total Bilirubin 0.3 - 1.2 mg/dL - 0.4 0.3  Alkaline Phos 38 - 126 U/L - 137(H) 130(H)  AST 15 - 41 U/L - 25 14(L)  ALT 0 - 44 U/L  - 13 7    Lab Results  Component Value Date   WBC 11.9 (H) 11/10/2018   HGB 11.9 (L) 11/10/2018   HCT 36.4 11/10/2018   MCV 92.4 11/10/2018   PLT 120 (L) 11/10/2018   NEUTROABS 16.1 (H) 11/09/2018    Dg Chest 2 View  Result Date: 11/09/2018 CLINICAL DATA:  Recent fall EXAM: CHEST - 2 VIEW COMPARISON:  PET-CT from 09/06/2018 FINDINGS: Cardiac shadow is stable. Aortic calcifications are seen. Postsurgical changes at the cervicothoracic junction are seen. Chronic changes in the right upper lobe are noted consistent with prior surgery and treatment. Right-sided pleural effusion is again identified and stable. No acute bony abnormality is noted. IMPRESSION: Stable changes in the right hemithorax consistent with prior surgery and treatment. Pleural effusion is again noted on the right stable from previous exam. Electronically Signed   By: Inez Catalina M.D.   On: 11/09/2018 12:33   Dg Lumbar Spine Complete  Result Date: 11/09/2018 CLINICAL DATA:  Recent fall with low back pain, initial encounter EXAM: LUMBAR SPINE - COMPLETE 4+ VIEW COMPARISON:  07/15/2018 FINDINGS: Five lumbar type vertebral bodies are well visualized.  Mild scoliosis concave to the right is seen. Vertebral body height is well maintained. There are areas of sclerosis noted within the L3 and L4 vertebral bodies similar to that noted on prior PET-CT. Similar changes are noted scattered throughout the pelvic bones. No anterolisthesis is noted. Rim calcified splenic cyst is noted in the left upper quadrant. IMPRESSION: Sclerotic foci within the lumbar spine and pelvic bones consistent with the known history of prior metastatic disease. No acute compression deformity is seen. Rim calcified splenic cyst in the left upper quadrant. Electronically Signed   By: Inez Catalina M.D.   On: 11/09/2018 12:32   Ct Head Wo Contrast  Result Date: 11/10/2018 CLINICAL DATA:  Altered mental status, falls. EXAM: CT HEAD WITHOUT CONTRAST TECHNIQUE:  Contiguous axial images were obtained from the base of the skull through the vertex without intravenous contrast. COMPARISON:  11/09/2018 FINDINGS: Brain: Ventricles and cisterns are normal. Remaining CSF spaces are within normal. There are scattered foci of acute subarachnoid hemorrhage over the right frontal and temporal regions without significant change. Possible mild subarachnoid hemorrhage over the left posterior temporal region tiny amount of acute subdural hemorrhage over the bilateral occipital regions right worse than left measuring up to 3-4 mm in thickness. Possible small amount of acute subdural hemorrhage over the lower posterior falx just above the tentorium. Previously seen small amount of subdural hemorrhage over the higher posterior falx not seen currently. No significant mass effect or midline shift. No evidence of focal mass. No evidence of acute infarction. Oval 1 cm hypodensity over the periphery of the left cerebellar hemisphere. Vascular: No hyperdense vessel or unexpected calcification. Skull: Known nondisplaced left occipital bone fracture. Sinuses/Orbits: Unchanged. Other: None. IMPRESSION: Persistent scattered small areas of acute subarachnoid hemorrhage over the right frontal and temporal regions without significant change. Possible faint acute subarachnoid hemorrhage over the left posterior temporal region. Tiny bilateral occipital subdural hematomas right greater than left measuring 3-4 mm in thickness. Small amount of subdural hemorrhage over the posterior falx just above the tentorium. No significant mass effect or midline shift. Known nondisplaced left occipital bone fracture. Electronically Signed   By: Marin Olp M.D.   On: 11/10/2018 08:54   Ct Head Wo Contrast  Result Date: 11/09/2018 CLINICAL DATA:  Recent fall, initial encounter EXAM: CT HEAD WITHOUT CONTRAST TECHNIQUE: Contiguous axial images were obtained from the base of the skull through the vertex without  intravenous contrast. COMPARISON:  08/25/2016 FINDINGS: Brain: There are small subdural hematomas along the left half of the tentorium cerebelli (3 mm in thickness) and to the left of the falx cerebri (4 mm in thickness). Additional small subdural hematomas are noted in the right parietal region and right frontal best seen on the coronal reconstructions. It measures 3 mm in thickness as well. Small areas of subarachnoid hemorrhage are noted with some parenchymal contusion in the right frontal lobe as well as within the right temporal lobe. Vascular: No hyperdense vessel or unexpected calcification. Skull: Undisplaced fracture is noted through the left occipital bone coursing adjacent to the foramen magnum. Old nasal bone fractures are noted bilaterally. Sinuses/Orbits: Within normal limits. Other: Scalp hematoma is noted over the left occipital fracture. IMPRESSION: Areas of subarachnoid hemorrhage in the right frontal and right temporal lobes. These are likely related to coup-contrecoup mechanism from the left occipital injury. Multiple small subdural hematomas along the left tentorium cerebelli as well as just to the left of the midline along the falx near the vertex posteriorly as well as  a small right parietal and right frontal lobe subdural hematomas. These measure 3-4 mm in thickness at each location in cause no significant mass effect at this time. Left occipital scalp hematoma with underlying occipital bone fracture. No significant displacement of the fracture is seen. Critical Value/emergent results were called by telephone at the time of interpretation on 11/09/2018 at 12:29 pm to Dr. Lennice Sites , who verbally acknowledged these results. Electronically Signed   By: Inez Catalina M.D.   On: 11/09/2018 12:29   Vas US Carotid  Result Date: 11/09/2018 Carotid Arterial Duplex Study Indications:       Syncope. Risk Factors:      Diabetes. Comparison Study:  No prior studies. Performing Technologist: Oliver Hum RVT  Examination Guidelines: A complete evaluation includes B-mode imaging, spectral Doppler, color Doppler, and power Doppler as needed of all accessible portions of each vessel. Bilateral testing is considered an integral part of a complete examination. Limited examinations for reoccurring indications may be performed as noted.  Right Carotid Findings: +----------+--------+--------+--------+-----------------------+--------+           PSV cm/sEDV cm/sStenosisDescribe               Comments +----------+--------+--------+--------+-----------------------+--------+ CCA Prox  82      8                                      tortuous +----------+--------+--------+--------+-----------------------+--------+ CCA Distal68      12              smooth and heterogenous         +----------+--------+--------+--------+-----------------------+--------+ ICA Prox  81      20              smooth and heterogenous         +----------+--------+--------+--------+-----------------------+--------+ ICA Distal64      14                                              +----------+--------+--------+--------+-----------------------+--------+ ECA       103     9                                               +----------+--------+--------+--------+-----------------------+--------+ +----------+--------+-------+--------+-------------------+           PSV cm/sEDV cmsDescribeArm Pressure (mmHG) +----------+--------+-------+--------+-------------------+ [id number]                                        +----------+--------+-------+--------+-------------------+ +---------+--------+--+--------+--+---------+ VertebralPSV cm/s52EDV cm/s11Antegrade +---------+--------+--+--------+--+---------+  Left Carotid Findings: +----------+--------+--------+--------+-----------------------+--------+           PSV cm/sEDV cm/sStenosisDescribe               Comments  +----------+--------+--------+--------+-----------------------+--------+ CCA Prox  82      11              smooth and heterogenoustortuous +----------+--------+--------+--------+-----------------------+--------+ CCA Distal65      7               smooth and heterogenous         +----------+--------+--------+--------+-----------------------+--------+ ICA Prox  69      14              smooth and heterogenous         +----------+--------+--------+--------+-----------------------+--------+ ICA Distal68      19                                     tortuous +----------+--------+--------+--------+-----------------------+--------+ ECA       93      7                                               +----------+--------+--------+--------+-----------------------+--------+ +----------+--------+--------+--------+-------------------+ SubclavianPSV cm/sEDV cm/sDescribeArm Pressure (mmHG) +----------+--------+--------+--------+-------------------+           119                                         +----------+--------+--------+--------+-------------------+ +---------+--------+--+--------+-+---------+ VertebralPSV cm/s44EDV cm/s8Antegrade +---------+--------+--+--------+-+---------+  Summary: Right Carotid: Velocities in the right ICA are consistent with a 1-39% stenosis. Left Carotid: Velocities in the left ICA are consistent with a 1-39% stenosis. Vertebrals: Bilateral vertebral arteries demonstrate antegrade flow. *See table(s) above for measurements and observations.  Electronically signed by Servando Snare MD on 11/09/2018 at 4:09:43 PM.    Final     ASSESSMENT AND PLAN: 1. Stage IV non small cell lung cancer, adenocarcinoma 2. Subarachnoid hemorrhage and multiple subdermal hematoma secondary to fall 3. Syncope 4. Leukocytosis, improved 5. Mild anemia 6. Mild thrombocytopenia 7. Hyperkalemia  -PET scan from May 2020 shows evidence of mild disease progression. Remains  on Zometa. -SAH is stable. Appreciate evaluation by Neurosurgery. Will have outpatient follow-up with them. -Unclear etiology of syncope. Echo pending. No brain mets noted on CT scans. Could consider MRI of brain if recurrent syncopal episodes. -Leukocytosis is improving. Remains afebrile. No signs of infection. Could be reactive. Will continue to follow.  -Mild anemia and thrombocytopenia likely due to underlying malignancy/bone mets. No transfusion is indicated.  -Received oral Potassium of 7/15. Due for [facility] today.  Has outpatient follow-up with Dr. Marin Olp on 11/24/2018.    LOS: 1 day   Mikey Bussing, DNP, AGPCNP-BC, AOCNP 11/10/18    ADDENDUM: I appreciate the outstanding consult done by [facility].  I saw and examined the patient.  I totally agree with her recommendations.  I do not think this is anything related to her lung cancer.  So far, there is no evidence of any lung cancer has metastasized to the brain.  Nothing is seen with any lung cancer in the occipital or cranial bones.  I feel bad that she fell and had this syncopal episode.  I am not sure why she would have these syncopal episodes.  She has not had echocardiogram.  I will know she needs any type of Holter monitor.  Thankfully, she really is not thrombocytopenic.  Platelet count is down a little bit today.  We will have to watch this.  She does have hyperglycemia.,  Sure this might be a factor.  We are just giving her Zometa.  She is tolerating this fairly well today.  Hopefully, she will regain her past performance status.  She has been free functional.  We will certainly follow along while she is in  the hospital.  We will try to help out any way that we can.  Lattie Haw, MD  Michaelyn Barter 6:10

## 2018-11-10 NOTE — Discharge Summary (Addendum)
Physician Discharge Summary  Samantha Quinn VVK:[id number] DOB: Dec 18, 1927 DOA: 11/09/2018  PCP: Binnie Rail, MD  Admit date: 11/09/2018 Discharge date: 11/10/2018  Admitted From: Home Disposition:  Home  Discharge Condition:Stable CODE STATUS:DNR Diet recommendation: Heart Healthy  Brief/Interim Summary:  HPI: Samantha Quinn is a 85 y.o. female with history of lung cancer with metastasis to bone, NIDDM-2, HTN, anxiety, hypothyroidism and chronic "dizziness" presenting after fall this morning. Patient has no recollection of fall but remembers feeling dizzy that she describes as lightheadedness.  She says she has history of vertigo but describes her dizziness as lightheadedness not spinning.  She is not on blood thinner.  She says she feels dizzy when she turns her head.  Fall is witnessed by Fish farm manager who called EMS.  Denies feeling groggy after fall.  She denies recent illness, URI symptoms, fever, chills, palpitation, chest pain, dyspnea, abdominal pain, urinary symptoms or focal neuro symptoms.  She admits to associated nausea and episode of emesis.  She denies new medication, known COVID-19 exposure or any other sick contacts.  She admits to chronic cough unchanged from baseline. Lives alone.  Ambulates independently.  Denies smoking cigarettes, drinking alcohol recreational drug use. In ED, vital signs stable.  BC remarkable for leukocytosis to 17 with bandemia and lymphopenia.  CMP remarkable for hypokalemia to 3.2 otherwise not impressive.  Urinalysis not impressive.  CXR without acute finding but right hemithorax from prior surgery and stable right pleural effusion.  EKG normal sinus rhythm.  PT/INR within normal range.  COVID-19 negative.  CT head with subarachnoid hemorrhage, multiple small subdural hematomas without mass-effect, left occipital scalp hematom with underlying occipital bone fracture but no significant displacement.  Case discussed with neurosurgery, Dr.  Arnoldo Morale by EDP who recommended admission and repeat CT head in the morning.  Received normal saline bolus, meclizine and Compazine and admitted.  Hospital Course:  Her hospital course remained stable.  She remained hemodynamically stable.  CT head done this morning did not show any progression of subdural hematoma.  She remained alert and oriented.  She denied any headache or dizziness.  She was seen by OT/PT and no follow-up recommended.  She was seen by neurosurgery, no plan for intervention, recommended to follow as an outpatient as needed. She stabilized quicker than we had anticipated. She is hemodynamically stable for discharge to home today.  Discussed with her daughter this morning on phone .  Following problems were addressed during hospitalization:  Syncopal episode/ground-level fall at home CT showed subarachnoid hemorrhage and multiple subdural hematoma without mass-effect. Occipital skull fracture without significant displacement Remains alert and oriented.  Hemodynamically stable.  PT/OT evaluation done.  No follow-up recommended. Denies any headache or dizziness this morning. Neurosurgery evaluated her and did not recommend any intervention.  She will follow-up with neurosurgery  in 2 weeks..  Hyperkalemia:  Given a dose of Lokelma this morning.  Check BMP in a week.  Leukocytosis:  Mild.  Most likely reactive  NIDDM-2:  On metformin at home.  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 Marin Olp. Follow up as an outpatient.  Hypertension:  -BP is in fair range. -Continue home regimen  History of thyrotoxicosis; -Normal TSH -On  home methimazole  Anxiety:  Stable  Discharge Diagnoses:  Active Problems:   Intracranial bleed [facility])    Discharge Instructions  Discharge Instructions    Diet - low sodium heart healthy  Complete by: As directed    Discharge instructions   Complete  by: As directed    1) Please follow-up with your PCP in a week.  Check BMP test during the follow-up. 2)Follow up with neurosurgery as an outpatient in 2 weeks.  Name and number the provider has been attached.   Increase activity slowly   Complete by: As directed      Allergies as of 11/10/2018      Reactions   Ramipril Other (See Comments)   Tongue swelling   Tramadol Other (See Comments)   syncope syncope   Codeine Nausea And Vomiting   Demerol Nausea Only      Medication List    TAKE these medications   acetaminophen 500 MG tablet Commonly known as: TYLENOL Take 500 mg by mouth every 6 (six) hours as needed for mild pain.   amitriptyline 10 MG tablet Commonly known as: ELAVIL TAKE 1 TABLET BY MOUTH EVERYDAY AT BEDTIME What changed: See the new instructions.   amLODipine 5 MG tablet Commonly known as: NORVASC Take 1 tablet (5 mg total) by mouth daily. What changed: when to take this   bisoprolol-hydrochlorothiazide 2.5-6.25 MG tablet Commonly known as: ZIAC TAKE 1 TABLET BY MOUTH EVERY DAY   cholecalciferol 1000 units tablet Commonly known as: VITAMIN D Take 2,000 Units by mouth daily.   gabapentin 100 MG capsule Commonly known as: NEURONTIN Take 100 mg by mouth every evening.   glucose blood test strip Commonly known as: ONE TOUCH ULTRA TEST 1 each by Other route 2 (two) times daily. Use to check blood sugars twice a day   HYDROcodone-homatropine 5-1.5 MG/5ML syrup Commonly known as: HYCODAN Take 5 mLs by mouth every 6 (six) hours as needed for cough. What changed: how much to take   lovastatin 20 MG tablet Commonly known as: MEVACOR TAKE 1 TABLET BY MOUTH EVERY OTHER DAY   metFORMIN 500 MG tablet Commonly known as: GLUCOPHAGE Take 1 tablet (500 mg total) by mouth 2 (two) times daily with a meal.   methimazole 5 MG tablet Commonly known as: TAPAZOLE Take 2.5 mg every other day   ONE TOUCH ULTRA 2 w/Device Kit Use to check blood sugars twice a  day Dx E11.9   onetouch ultrasoft lancets 1 each by Other route 2 (two) times daily. Use to help check blood sugars twice a day   polyethylene glycol 17 g packet Commonly known as: MIRALAX / GLYCOLAX Take 17 g by mouth daily as needed for mild constipation.   senna 8.6 MG Tabs tablet Commonly known as: SENOKOT Take 1 tablet (8.6 mg total) by mouth 2 (two) times daily.   VISION-VITE PRESERVE PO Take 1 tablet by mouth daily.      Follow-up Information    Binnie Rail, MD. Schedule an appointment as soon as possible for a visit in 1 week(s).   Specialty: Internal Medicine Contact information: [place] [address] [phone]        Newman Pies, MD. Schedule an appointment as soon as possible for a visit in 2 week(s).   Specialty: Neurosurgery Contact information: [address] [place] [address] [phone]          Allergies  Allergen Reactions  . Ramipril Other (See Comments)    Tongue swelling  . Tramadol Other (See Comments)    syncope syncope  . Codeine Nausea And Vomiting  . Demerol Nausea Only    Consultations:  Neurosurgery, oncology  Procedures/Studies: Dg Chest 2 View  Result Date: 11/09/2018 CLINICAL DATA:  Recent fall EXAM: CHEST - 2 VIEW COMPARISON:  PET-CT from 09/06/2018 FINDINGS: Cardiac shadow is stable. Aortic calcifications are seen. Postsurgical changes at the cervicothoracic junction are seen. Chronic changes in the right upper lobe are noted consistent with prior surgery and treatment. Right-sided pleural effusion is again identified and stable. No acute bony abnormality is noted. IMPRESSION: Stable changes in the right hemithorax consistent with prior surgery and treatment. Pleural effusion is again noted on the right stable from previous exam. Electronically Signed   By: Inez Catalina M.D.   On: 11/09/2018 12:33   Dg Lumbar Spine Complete  Result Date: 11/09/2018 CLINICAL DATA:  Recent  fall with low back pain, initial encounter EXAM: LUMBAR SPINE - COMPLETE 4+ VIEW COMPARISON:  07/15/2018 FINDINGS: Five lumbar type vertebral bodies are well visualized. Mild scoliosis concave to the right is seen. Vertebral body height is well maintained. There are areas of sclerosis noted within the L3 and L4 vertebral bodies similar to that noted on prior PET-CT. Similar changes are noted scattered throughout the pelvic bones. No anterolisthesis is noted. Rim calcified splenic cyst is noted in the left upper quadrant. IMPRESSION: Sclerotic foci within the lumbar spine and pelvic bones consistent with the known history of prior metastatic disease. No acute compression deformity is seen. Rim calcified splenic cyst in the left upper quadrant. Electronically Signed   By: Inez Catalina M.D.   On: 11/09/2018 12:32   Ct Head Wo Contrast  Result Date: 11/10/2018 CLINICAL DATA:  Altered mental status, falls. EXAM: CT HEAD WITHOUT CONTRAST TECHNIQUE: Contiguous axial images were obtained from the base of the skull through the vertex without intravenous contrast. COMPARISON:  11/09/2018 FINDINGS: Brain: Ventricles and cisterns are normal. Remaining CSF spaces are within normal. There are scattered foci of acute subarachnoid hemorrhage over the right frontal and temporal regions without significant change. Possible mild subarachnoid hemorrhage over the left posterior temporal region tiny amount of acute subdural hemorrhage over the bilateral occipital regions right worse than left measuring up to 3-4 mm in thickness. Possible small amount of acute subdural hemorrhage over the lower posterior falx just above the tentorium. Previously seen small amount of subdural hemorrhage over the higher posterior falx not seen currently. No significant mass effect or midline shift. No evidence of focal mass. No evidence of acute infarction. Oval 1 cm hypodensity over the periphery of the left cerebellar hemisphere. Vascular: No  hyperdense vessel or unexpected calcification. Skull: Known nondisplaced left occipital bone fracture. Sinuses/Orbits: Unchanged. Other: None. IMPRESSION: Persistent scattered small areas of acute subarachnoid hemorrhage over the right frontal and temporal regions without significant change. Possible faint acute subarachnoid hemorrhage over the left posterior temporal region. Tiny bilateral occipital subdural hematomas right greater than left measuring 3-4 mm in thickness. Small amount of subdural hemorrhage over the posterior falx just above the tentorium. No significant mass effect or midline shift. Known nondisplaced left occipital bone fracture. Electronically Signed   By: Marin Olp M.D.   On: 11/10/2018 08:54   Ct Head Wo Contrast  Result Date: 11/09/2018 CLINICAL DATA:  Recent fall, initial encounter EXAM: CT HEAD WITHOUT CONTRAST TECHNIQUE: Contiguous axial images were obtained from the base of the skull through the vertex without intravenous contrast. COMPARISON:  08/25/2016 FINDINGS: Brain: There are small subdural hematomas along the left half of the tentorium cerebelli (3 mm in thickness) and to the left of the falx cerebri (4 mm in thickness). Additional  small subdural hematomas are noted in the right parietal region and right frontal best seen on the coronal reconstructions. It measures 3 mm in thickness as well. Small areas of subarachnoid hemorrhage are noted with some parenchymal contusion in the right frontal lobe as well as within the right temporal lobe. Vascular: No hyperdense vessel or unexpected calcification. Skull: Undisplaced fracture is noted through the left occipital bone coursing adjacent to the foramen magnum. Old nasal bone fractures are noted bilaterally. Sinuses/Orbits: Within normal limits. Other: Scalp hematoma is noted over the left occipital fracture. IMPRESSION: Areas of subarachnoid hemorrhage in the right frontal and right temporal lobes. These are likely related to  coup-contrecoup mechanism from the left occipital injury. Multiple small subdural hematomas along the left tentorium cerebelli as well as just to the left of the midline along the falx near the vertex posteriorly as well as a small right parietal and right frontal lobe subdural hematomas. These measure 3-4 mm in thickness at each location in cause no significant mass effect at this time. Left occipital scalp hematoma with underlying occipital bone fracture. No significant displacement of the fracture is seen. Critical Value/emergent results were called by telephone at the time of interpretation on 11/09/2018 at 12:29 pm to Dr. Lennice Sites , who verbally acknowledged these results. Electronically Signed   By: Inez Catalina M.D.   On: 11/09/2018 12:29   Vas US Carotid  Result Date: 11/09/2018 Carotid Arterial Duplex Study Indications:       Syncope. Risk Factors:      Diabetes. Comparison Study:  No prior studies. Performing Technologist: Oliver Hum RVT  Examination Guidelines: A complete evaluation includes B-mode imaging, spectral Doppler, color Doppler, and power Doppler as needed of all accessible portions of each vessel. Bilateral testing is considered an integral part of a complete examination. Limited examinations for reoccurring indications may be performed as noted.  Right Carotid Findings: +----------+--------+--------+--------+-----------------------+--------+           PSV cm/sEDV cm/sStenosisDescribe               Comments +----------+--------+--------+--------+-----------------------+--------+ CCA Prox  82      8                                      tortuous +----------+--------+--------+--------+-----------------------+--------+ CCA Distal68      12              smooth and heterogenous         +----------+--------+--------+--------+-----------------------+--------+ ICA Prox  81      20              smooth and heterogenous          +----------+--------+--------+--------+-----------------------+--------+ ICA Distal64      14                                              +----------+--------+--------+--------+-----------------------+--------+ ECA       103     9                                               +----------+--------+--------+--------+-----------------------+--------+ +----------+--------+-------+--------+-------------------+           PSV  cm/sEDV cmsDescribeArm Pressure (mmHG) +----------+--------+-------+--------+-------------------+ [id number]                                        +----------+--------+-------+--------+-------------------+ +---------+--------+--+--------+--+---------+ VertebralPSV cm/s52EDV cm/s11Antegrade +---------+--------+--+--------+--+---------+  Left Carotid Findings: +----------+--------+--------+--------+-----------------------+--------+           PSV cm/sEDV cm/sStenosisDescribe               Comments +----------+--------+--------+--------+-----------------------+--------+ CCA Prox  82      11              smooth and heterogenoustortuous +----------+--------+--------+--------+-----------------------+--------+ CCA Distal65      7               smooth and heterogenous         +----------+--------+--------+--------+-----------------------+--------+ ICA Prox  69      14              smooth and heterogenous         +----------+--------+--------+--------+-----------------------+--------+ ICA Distal68      19                                     tortuous +----------+--------+--------+--------+-----------------------+--------+ ECA       93      7                                               +----------+--------+--------+--------+-----------------------+--------+ +----------+--------+--------+--------+-------------------+ SubclavianPSV cm/sEDV cm/sDescribeArm Pressure (mmHG)  +----------+--------+--------+--------+-------------------+           119                                         +----------+--------+--------+--------+-------------------+ +---------+--------+--+--------+-+---------+ VertebralPSV cm/s44EDV cm/s8Antegrade +---------+--------+--+--------+-+---------+  Summary: Right Carotid: Velocities in the right ICA are consistent with a 1-39% stenosis. Left Carotid: Velocities in the left ICA are consistent with a 1-39% stenosis. Vertebrals: Bilateral vertebral arteries demonstrate antegrade flow. *See table(s) above for measurements and observations.  Electronically signed by Servando Snare MD on 11/09/2018 at 4:09:43 PM.    Final        Subjective:  Patient seen and examined the bedside this morning.  Remains comfortable.  Sitting in the chair.  Denies any headache or dizziness.  Alert and oriented.  Hemodynamically stable for discharge.  Discharge Exam: Vitals:   11/10/18 0900 11/10/18 1154  BP: 122/63 106/67  Pulse: (!) 102 96  Resp: 18 18  Temp: 98.7 F (37.1 C) 98.7 F (37.1 C)  SpO2: 96% 96%   Vitals:   11/10/18 0100 11/10/18 0340 11/10/18 0900 11/10/18 1154  BP:  (!) 145/73 122/63 106/67  Pulse:  88 (!) 102 96  Resp:  '16 18 18  ' Temp:  98.4 F (36.9 C) 98.7 F (37.1 C) 98.7 F (37.1 C)  TempSrc:  Oral Oral Oral  SpO2:  98% 96% 96%  Weight: 52.6 kg     Height: '5\' 1"'  (1.549 m)       General: Pt is alert, awake, not in acute distress Cardiovascular: RRR, S1/S2 +, no rubs, no gallops Respiratory: CTA bilaterally, no wheezing, no rhonchi Abdominal: Soft, NT, ND, bowel sounds + Extremities: no  edema, no cyanosis    The results of significant diagnostics from this hospitalization (including imaging, microbiology, ancillary and laboratory) are listed below for reference.     Microbiology: Recent Results (from the past 240 hour(s))  SARS Coronavirus 2 (CEPHEID - Performed in [place] hospital lab), Hosp Order      Status: None   Collection Time: 11/09/18  1:58 PM   Specimen: Nasopharyngeal Swab  Result Value Ref Range Status   SARS Coronavirus 2 NEGATIVE NEGATIVE Final    Comment: (NOTE) If result is NEGATIVE SARS-CoV-2 target nucleic acids are NOT DETECTED. The SARS-CoV-2 RNA is generally detectable in upper and lower  respiratory specimens during the acute phase of infection. The lowest  concentration of SARS-CoV-2 viral copies this assay can detect is 250  copies / mL. A negative result does not preclude SARS-CoV-2 infection  and should not be used as the sole basis for treatment or other  patient management decisions.  A negative result may occur with  improper specimen collection / handling, submission of specimen other  than nasopharyngeal swab, presence of viral mutation(s) within the  areas targeted by this assay, and inadequate number of viral copies  (<250 copies / mL). A negative result must be combined with clinical  observations, patient history, and epidemiological information. If result is POSITIVE SARS-CoV-2 target nucleic acids are DETECTED. The SARS-CoV-2 RNA is generally detectable in upper and lower  respiratory specimens dur ing the acute phase of infection.  Positive  results are indicative of active infection with SARS-CoV-2.  Clinical  correlation with patient history and other diagnostic information is  necessary to determine patient infection status.  Positive results do  not rule out bacterial infection or co-infection with other viruses. If result is PRESUMPTIVE POSTIVE SARS-CoV-2 nucleic acids MAY BE PRESENT.   A presumptive positive result was obtained on the submitted specimen  and confirmed on repeat testing.  While 2019 novel coronavirus  (SARS-CoV-2) nucleic acids may be present in the submitted sample  additional confirmatory testing may be necessary for epidemiological  and / or clinical management purposes  to differentiate between  SARS-CoV-2 and other  Sarbecovirus currently known to infect humans.  If clinically indicated additional testing with an alternate test  methodology [phone]) is advised. The SARS-CoV-2 RNA is generally  detectable in upper and lower respiratory sp ecimens during the acute  phase of infection. The expected result is Negative. Fact Sheet for Patients:  StrictlyIdeas.no Fact Sheet for Healthcare Providers: BankingDealers.co.za This test is not yet approved or cleared by the [place] FDA and has been authorized for detection and/or diagnosis of SARS-CoV-2 by FDA under an Emergency Use Authorization (EUA).  This EUA will remain in effect (meaning this test can be used) for the duration of the COVID-19 declaration under Section 564(b)(1) of the Act, 21 U.S.C. section 360bbb-3(b)(1), unless the authorization is terminated or revoked sooner. Performed at [facility], [place] [address] [place], [place] [address]      Labs: BNP (last 3 results) No results for input(s): BNP in the last 8760 hours. Basic Metabolic Panel: Recent Labs  Lab 11/09/18 1106 11/10/18 0929  NA 137 135  K 3.2* 5.6*  CL 102 104  CO2 23 18*  GLUCOSE 208* 249*  BUN 18 12  CREATININE 0.58 0.72  CALCIUM 8.9 8.5*   Liver Function Tests: Recent Labs  Lab 11/09/18 1106  AST 25  ALT 13  ALKPHOS 137*  BILITOT 0.4  PROT 7.1  ALBUMIN  3.9   No results for input(s): LIPASE, AMYLASE in the last 168 hours. No results for input(s): AMMONIA in the last 168 hours. CBC: Recent Labs  Lab 11/09/18 1106 11/10/18 0929  WBC 17.3* 11.9*  NEUTROABS 16.1*  --   HGB 12.2 11.9*  HCT 37.6 36.4  MCV 94.2 92.4  PLT 192 120*   Cardiac Enzymes: No results for input(s): CKTOTAL, CKMB, CKMBINDEX, TROPONINI in the last 168 hours. BNP: Invalid input(s): POCBNP CBG: Recent Labs  Lab 11/09/18 2123 11/10/18 0614 11/10/18 1153  GLUCAP 152* 111* 147*   D-Dimer No results  for input(s): DDIMER in the last 72 hours. Hgb A1c No results for input(s): HGBA1C in the last 72 hours. Lipid Profile No results for input(s): CHOL, HDL, LDLCALC, TRIG, CHOLHDL, LDLDIRECT in the last 72 hours. Thyroid function studies Recent Labs    11/09/18 1523  TSH 1.173   Anemia work up No results for input(s): VITAMINB12, FOLATE, FERRITIN, TIBC, IRON, RETICCTPCT in the last 72 hours. Urinalysis    Component Value Date/Time   COLORURINE STRAW (A) 11/09/2018 1227   APPEARANCEUR CLEAR 11/09/2018 1227   LABSPEC 1.009 11/09/2018 1227   PHURINE 7.0 11/09/2018 1227   GLUCOSEU 50 (A) 11/09/2018 1227   HGBUR NEGATIVE 11/09/2018 1227   BILIRUBINUR NEGATIVE 11/09/2018 1227   KETONESUR NEGATIVE 11/09/2018 1227   PROTEINUR NEGATIVE 11/09/2018 1227   UROBILINOGEN 0.2 07/16/2011 0947   NITRITE NEGATIVE 11/09/2018 1227   LEUKOCYTESUR NEGATIVE 11/09/2018 1227   Sepsis Labs Invalid input(s): PROCALCITONIN,  WBC,  LACTICIDVEN Microbiology Recent Results (from the past 240 hour(s))  SARS Coronavirus 2 (CEPHEID - Performed in [place] hospital lab), Hosp Order     Status: None   Collection Time: 11/09/18  1:58 PM   Specimen: Nasopharyngeal Swab  Result Value Ref Range Status   SARS Coronavirus 2 NEGATIVE NEGATIVE Final    Comment: (NOTE) If result is NEGATIVE SARS-CoV-2 target nucleic acids are NOT DETECTED. The SARS-CoV-2 RNA is generally detectable in upper and lower  respiratory specimens during the acute phase of infection. The lowest  concentration of SARS-CoV-2 viral copies this assay can detect is 250  copies / mL. A negative result does not preclude SARS-CoV-2 infection  and should not be used as the sole basis for treatment or other  patient management decisions.  A negative result may occur with  improper specimen collection / handling, submission of specimen other  than nasopharyngeal swab, presence of viral mutation(s) within the  areas targeted by this assay, and  inadequate number of viral copies  (<250 copies / mL). A negative result must be combined with clinical  observations, patient history, and epidemiological information. If result is POSITIVE SARS-CoV-2 target nucleic acids are DETECTED. The SARS-CoV-2 RNA is generally detectable in upper and lower  respiratory specimens dur ing the acute phase of infection.  Positive  results are indicative of active infection with SARS-CoV-2.  Clinical  correlation with patient history and other diagnostic information is  necessary to determine patient infection status.  Positive results do  not rule out bacterial infection or co-infection with other viruses. If result is PRESUMPTIVE POSTIVE SARS-CoV-2 nucleic acids MAY BE PRESENT.   A presumptive positive result was obtained on the submitted specimen  and confirmed on repeat testing.  While 2019 novel coronavirus  (SARS-CoV-2) nucleic acids may be present in the submitted sample  additional confirmatory testing may be necessary for epidemiological  and / or clinical management purposes  to differentiate between  SARS-CoV-2  and other Sarbecovirus currently known to infect humans.  If clinically indicated additional testing with an alternate test  methodology [phone]) is advised. The SARS-CoV-2 RNA is generally  detectable in upper and lower respiratory sp ecimens during the acute  phase of infection. The expected result is Negative. Fact Sheet for Patients:  StrictlyIdeas.no Fact Sheet for Healthcare Providers: BankingDealers.co.za This test is not yet approved or cleared by the [place] FDA and has been authorized for detection and/or diagnosis of SARS-CoV-2 by FDA under an Emergency Use Authorization (EUA).  This EUA will remain in effect (meaning this test can be used) for the duration of the COVID-19 declaration under Section 564(b)(1) of the Act, 21 U.S.C. section 360bbb-3(b)(1), unless the  authorization is terminated or revoked sooner. Performed at [facility], [place] [address] [place], [place] [address]     Please note: You were cared for by a hospitalist during your hospital stay. Once you are discharged, your primary care physician will handle any further medical issues. Please note that NO REFILLS for any discharge medications will be authorized once you are discharged, as it is imperative that you return to your primary care physician (or establish a relationship with a primary care physician if you do not have one) for your post hospital discharge needs so that they can reassess your need for medications and monitor your lab values.    Time coordinating discharge: 40 minutes  SIGNED:   Shelly Coss, MD  [facility] 11/10/2018, 3:07 PM Pager [phone]  If 7PM-7AM, please contact night-coverage [email or web address] Password [email or web address]

## 2018-11-10 NOTE — Consult Note (Signed)
Providing Compassionate, Quality Care - Together   Reason for Consult: SDH, SAH Referring Physician: Dr. Lowell Bouton Samantha Quinn is an 85 y.o. female.  HPI: Patient presented to the ED on 11/09/2018 with complaint of dizziness and a fall. She was amnestic to the event and wasn't sure if she hit her head. She has a PMH significant for DM, high cholesterol, chronic low back pain, and PE (no longer on anticoagulation). She was neuro intact upon arrival. A CT scan of her head revealed multiple small subdural hematomas along the left tentorium, a small right parietal and right frontal lobe subdural hematomas, and areas of subarachnoid hemorrhage in the right frontal and right temporal lobes. The areas of [facility] are likely related to coup-contrecoup mechanism from the left occipital injury.There was no significant mass effect at the time of the initial scan. A left occipital scalp hematoma with underlying nondisplaced occipital bone fracture was also noted. Lumbar CT was negative. Neurosurgery was consulted for further evaluation and recommendations.  Past Medical History:  Diagnosis Date   Anxiety    Back pain    bulding disc   Bronchitis    hx of-many,many years    Diabetes mellitus    Diabetes mellitus    type 2 and takes Metformin bid   Dyslipidemia    Embolism - blood clot    hx of in left lower leg and was on Coumadin for about 65months;this was about 8-4yrs ago   Family history of adverse reaction to anesthesia    Daughter- Nausea   Goals of care, counseling/discussion 11/12/2017   History of colonic polyps    History of radiation therapy 01/06/16-02/19/16   The primary tumor and involved mediastinal adenopathy were treated to 66 Gy in 33 fractions of 2 Gy.   History of radiation therapy 04/01/17-04/26/17   cervical spine 35 Gy in 14 fractions   HOH (hard of hearing)    Hx of migraines    as a teenager   Hypercholesterolemia    Hyperlipidemia    takes Lovastatin  every other day   Hypertension    takes Ziac daily and Ramipril as well   Hyperthyroidism    Hypothyroidism    takes Synthroid every other day   Insomnia    takes Elavil nightly   Joint pain    Lung mass    Myalgia    Osteoarthritis    Osteoporosis    Pneumothorax of right lung after biopsy 07/09/11   HAD CHEST TUBE PLACEMENT   PONV (postoperative nausea and vomiting)    Primary adenocarcinoma of lower lobe of right lung ([place]) 06/24/2011   pT2a, pN0 M0 Stage 1 Well differenced Adenocarcinoma 3.5 cm resected 07/17/2011   Primary adenocarcinoma of lung ([place]) 06/24/2011   Shortness of breath dyspnea    With exertion   Skin cancer    legs have dark spots on them   Upper respiratory infection 04/2011   Urinary incontinence    takes Detrol daily prn    Past Surgical History:  Procedure Laterality Date   ABDOMINAL HYSTERECTOMY     ANTERIOR CERVICAL DECOMP/DISCECTOMY FUSION N/A 02/17/2017   Procedure: ANTERIOR CERVICAL DECOMPRESSION FUSION, CERVICAL 6-7 WITH INSTRUMENTATION AND ALLOGRAFT;  Surgeon: Phylliss Bob, MD;  Location: [place];  Service: Orthopedics;  Laterality: N/A;  ANTERIOR CERVICAL DECOMPRESSION FUSION, CERVICAL 6-7 WITH INSTRUMENTATION AND ALLOGRAFT; REQUEST 2 HOURS AND FLIP    APPENDECTOMY     as a child   CHEST TUBE REMOVAL  07/13/11  RIGHT  CHEST TUBE   CHOLECYSTECTOMY     COLONOSCOPY     COLONOSCOPY     DILATION AND CURETTAGE OF UTERUS     x 2   EYE SURGERY Bilateral    Cataract   HEMORRHOID SURGERY     IR THORACENTESIS ASP PLEURAL SPACE W/IMG GUIDE  02/03/2018   LUNG BIOPSY  07/09/11   RIGHT UPER LOBE LUNG MASS    LUNG LOBECTOMY Right 06/2011   POSTERIOR CERVICAL FUSION/FORAMINOTOMY N/A 02/18/2017   Procedure: CERVICAL Five-Six, CERVICAL Six-Seven, CERVICAL Seven-THORACIC One, THORACIC One-Two POSTERIOR SPINAL FUSION WITH INSTRUMENTATION AND ALLOGRAFT;  Surgeon: Phylliss Bob, MD;  Location: [place];  Service: Orthopedics;   Laterality: N/A;   RIGHT CHEST TUBE PLACEMENT  07/10/11   S/P LUNG BX   ROTATOR CUFF REPAIR     bilateral   TOE SURGERY  2013   right small toe   TONSILLECTOMY     as a child   VIDEO BRONCHOSCOPY  07/17/2011   Procedure: VIDEO BRONCHOSCOPY;  Surgeon: Grace Isaac, MD;  Location: [place];  Service: Thoracic;  Laterality: N/A;   VIDEO BRONCHOSCOPY WITH ENDOBRONCHIAL ULTRASOUND N/A 12/10/2015   Procedure: VIDEO BRONCHOSCOPY WITH ENDOBRONCHIAL ULTRASOUND with biopsies.;  Surgeon: Grace Isaac, MD;  Location: [facility] OR;  Service: Thoracic;  Laterality: N/A;    Family History  Problem Relation Age of Onset   Hypertension Mother    Hypertension Father    Arthritis Sister    Arthritis Sister    Diabetes Brother    Hypertension Sister    Anesthesia problems Neg Hx    Hypotension Neg Hx    Malignant hyperthermia Neg Hx    Pseudochol deficiency Neg Hx    Heart attack Neg Hx     Social History:  reports that she has never smoked. She has never used smokeless tobacco. She reports that she does not drink alcohol or use drugs.  Allergies:  Allergies  Allergen Reactions   Ramipril Other (See Comments)    Tongue swelling   Tramadol Other (See Comments)    syncope syncope   Codeine Nausea And Vomiting   Demerol Nausea Only    Medications: I have reviewed the patient's current medications.  Results for orders placed or performed during the hospital encounter of 11/09/18 (from the past 48 hour(s))  CBC with Differential     Status: Abnormal   Collection Time: 11/09/18 11:06 AM  Result Value Ref Range   WBC 17.3 (H) 4.0 - 10.5 K/uL   RBC 3.99 3.87 - 5.11 MIL/uL   Hemoglobin 12.2 12.0 - 15.0 g/dL   HCT 37.6 36.0 - 46.0 %   MCV 94.2 80.0 - 100.0 fL   MCH 30.6 26.0 - 34.0 pg   MCHC 32.4 30.0 - 36.0 g/dL   RDW 13.9 11.5 - 15.5 %   Platelets 192 150 - 400 K/uL   nRBC 0.0 0.0 - 0.2 %   Neutrophils Relative % 94 %   Neutro Abs 16.1 (H) 1.7 - 7.7 K/uL    Lymphocytes Relative 2 %   Lymphs Abs 0.4 (L) 0.7 - 4.0 K/uL   Monocytes Relative 3 %   Monocytes Absolute 0.6 0.1 - 1.0 K/uL   Eosinophils Relative 0 %   Eosinophils Absolute 0.0 0.0 - 0.5 K/uL   Basophils Relative 0 %   Basophils Absolute 0.0 0.0 - 0.1 K/uL   Immature Granulocytes 1 %   Abs Immature Granulocytes 0.20 (H) 0.00 - 0.07 K/uL  Comment: Performed at [facility], [place] [address], [place], [place] [address]  Basic metabolic panel     Status: Abnormal   Collection Time: 11/09/18 11:06 AM  Result Value Ref Range   Sodium 137 135 - 145 mmol/L   Potassium 3.2 (L) 3.5 - 5.1 mmol/L   Chloride 102 98 - 111 mmol/L   CO2 23 22 - 32 mmol/L   Glucose, Bld 208 (H) 70 - 99 mg/dL   BUN 18 8 - 23 mg/dL   Creatinine, Ser 0.58 0.44 - 1.00 mg/dL   Calcium 8.9 8.9 - 10.3 mg/dL   GFR calc non Af Amer >60 >60 mL/min   GFR calc Af Amer >60 >60 mL/min   Anion gap 12 5 - 15    Comment: Performed at [facility], [place] [address], [place], [place] [address]  Hepatic function panel     Status: Abnormal   Collection Time: 11/09/18 11:06 AM  Result Value Ref Range   Total Protein 7.1 6.5 - 8.1 g/dL   Albumin 3.9 3.5 - 5.0 g/dL   AST 25 15 - 41 U/L   ALT 13 0 - 44 U/L   Alkaline Phosphatase 137 (H) 38 - 126 U/L   Total Bilirubin 0.4 0.3 - 1.2 mg/dL   Bilirubin, Direct 0.1 0.0 - 0.2 mg/dL   Indirect Bilirubin 0.3 0.3 - 0.9 mg/dL    Comment: Performed at [facility], [place] [address], [place], [place] [address]  Urinalysis, Routine w reflex microscopic     Status: Abnormal   Collection Time: 11/09/18 12:27 PM  Result Value Ref Range   Color, Urine STRAW (A) YELLOW   APPearance CLEAR CLEAR   Specific Gravity, Urine 1.009 1.005 - 1.030   pH 7.0 5.0 - 8.0   Glucose, UA 50 (A) NEGATIVE mg/dL   Hgb urine dipstick NEGATIVE NEGATIVE   Bilirubin Urine NEGATIVE NEGATIVE   Ketones, ur NEGATIVE NEGATIVE mg/dL   Protein, ur NEGATIVE NEGATIVE  mg/dL   Nitrite NEGATIVE NEGATIVE   Leukocytes,Ua NEGATIVE NEGATIVE    Comment: Performed at [facility] [address], [place], [place] [address]  SARS Coronavirus 2 (CEPHEID - Performed in [facility] hospital lab), Hosp Order     Status: None   Collection Time: 11/09/18  1:58 PM   Specimen: Nasopharyngeal Swab  Result Value Ref Range   SARS Coronavirus 2 NEGATIVE NEGATIVE    Comment: (NOTE) If result is NEGATIVE SARS-CoV-2 target nucleic acids are NOT DETECTED. The SARS-CoV-2 RNA is generally detectable in upper and lower  respiratory specimens during the acute phase of infection. The lowest  concentration of SARS-CoV-2 viral copies this assay can detect is 250  copies / mL. A negative result does not preclude SARS-CoV-2 infection  and should not be used as the sole basis for treatment or other  patient management decisions.  A negative result may occur with  improper specimen collection / handling, submission of specimen other  than nasopharyngeal swab, presence of viral mutation(s) within the  areas targeted by this assay, and inadequate number of viral copies  (<250 copies / mL). A negative result must be combined with clinical  observations, patient history, and epidemiological information. If result is POSITIVE SARS-CoV-2 target nucleic acids are DETECTED. The SARS-CoV-2 RNA is generally detectable in upper and lower  respiratory specimens dur ing the acute phase of infection.  Positive  results are indicative of active infection with SARS-CoV-2.  Clinical  correlation  with patient history and other diagnostic information is  necessary to determine patient infection status.  Positive results do  not rule out bacterial infection or co-infection with other viruses. If result is PRESUMPTIVE POSTIVE SARS-CoV-2 nucleic acids MAY BE PRESENT.   A presumptive positive result was obtained on the submitted specimen  and confirmed on repeat testing.  While 2019  novel coronavirus  (SARS-CoV-2) nucleic acids may be present in the submitted sample  additional confirmatory testing may be necessary for epidemiological  and / or clinical management purposes  to differentiate between  SARS-CoV-2 and other Sarbecovirus currently known to infect humans.  If clinically indicated additional testing with an alternate test  methodology [phone]) is advised. The SARS-CoV-2 RNA is generally  detectable in upper and lower respiratory sp ecimens during the acute  phase of infection. The expected result is Negative. Fact Sheet for Patients:  StrictlyIdeas.no Fact Sheet for Healthcare Providers: BankingDealers.co.za This test is not yet approved or cleared by the [place] FDA and has been authorized for detection and/or diagnosis of SARS-CoV-2 by FDA under an Emergency Use Authorization (EUA).  This EUA will remain in effect (meaning this test can be used) for the duration of the COVID-19 declaration under Section 564(b)(1) of the Act, 21 U.S.C. section 360bbb-3(b)(1), unless the authorization is terminated or revoked sooner. Performed at [facility], [place] [address], [place], [place] [address]   TSH     Status: None   Collection Time: 11/09/18  3:23 PM  Result Value Ref Range   TSH 1.173 0.350 - 4.500 uIU/mL    Comment: Performed by a 3rd Generation assay with a functional sensitivity of <=0.01 uIU/mL. Performed at [facility], [place] [address] [place], [place] [address]   Protime-INR     Status: None   Collection Time: 11/09/18  3:23 PM  Result Value Ref Range   Prothrombin Time 12.0 11.4 - 15.2 seconds   INR 0.9 0.8 - 1.2    Comment: (NOTE) INR goal varies based on device and disease states. Performed at [facility], [place] [address], [place], [place] [address]   APTT     Status: None   Collection Time: 11/09/18  3:23 PM  Result Value Ref  Range   aPTT 24 24 - 36 seconds    Comment: Performed at [facility], [place] [address] [place], [place] [address]  Glucose, capillary     Status: Abnormal   Collection Time: 11/09/18  9:23 PM  Result Value Ref Range   Glucose-Capillary 152 (H) 70 - 99 mg/dL   Comment 1 Notify RN    Comment 2 Document in Chart   Glucose, capillary     Status: Abnormal   Collection Time: 11/10/18  6:14 AM  Result Value Ref Range   Glucose-Capillary 111 (H) 70 - 99 mg/dL   Comment 1 Notify RN    Comment 2 Document in Chart     Dg Chest 2 View  Result Date: 11/09/2018 CLINICAL DATA:  Recent fall EXAM: CHEST - 2 VIEW COMPARISON:  PET-CT from 09/06/2018 FINDINGS: Cardiac shadow is stable. Aortic calcifications are seen. Postsurgical changes at the cervicothoracic junction are seen. Chronic changes in the right upper lobe are noted consistent with prior surgery and treatment. Right-sided pleural effusion is again identified and stable. No acute bony abnormality is noted. IMPRESSION: Stable changes in the right hemithorax consistent with prior surgery and treatment. Pleural effusion is again noted on the right stable  from previous exam. Electronically Signed   By: Inez Catalina M.D.   On: 11/09/2018 12:33   Dg Lumbar Spine Complete  Result Date: 11/09/2018 CLINICAL DATA:  Recent fall with low back pain, initial encounter EXAM: LUMBAR SPINE - COMPLETE 4+ VIEW COMPARISON:  07/15/2018 FINDINGS: Five lumbar type vertebral bodies are well visualized. Mild scoliosis concave to the right is seen. Vertebral body height is well maintained. There are areas of sclerosis noted within the L3 and L4 vertebral bodies similar to that noted on prior PET-CT. Similar changes are noted scattered throughout the pelvic bones. No anterolisthesis is noted. Rim calcified splenic cyst is noted in the left upper quadrant. IMPRESSION: Sclerotic foci within the lumbar spine and pelvic bones consistent with the known history of  prior metastatic disease. No acute compression deformity is seen. Rim calcified splenic cyst in the left upper quadrant. Electronically Signed   By: Inez Catalina M.D.   On: 11/09/2018 12:32   Ct Head Wo Contrast  Result Date: 11/10/2018 CLINICAL DATA:  Altered mental status, falls. EXAM: CT HEAD WITHOUT CONTRAST TECHNIQUE: Contiguous axial images were obtained from the base of the skull through the vertex without intravenous contrast. COMPARISON:  11/09/2018 FINDINGS: Brain: Ventricles and cisterns are normal. Remaining CSF spaces are within normal. There are scattered foci of acute subarachnoid hemorrhage over the right frontal and temporal regions without significant change. Possible mild subarachnoid hemorrhage over the left posterior temporal region tiny amount of acute subdural hemorrhage over the bilateral occipital regions right worse than left measuring up to 3-4 mm in thickness. Possible small amount of acute subdural hemorrhage over the lower posterior falx just above the tentorium. Previously seen small amount of subdural hemorrhage over the higher posterior falx not seen currently. No significant mass effect or midline shift. No evidence of focal mass. No evidence of acute infarction. Oval 1 cm hypodensity over the periphery of the left cerebellar hemisphere. Vascular: No hyperdense vessel or unexpected calcification. Skull: Known nondisplaced left occipital bone fracture. Sinuses/Orbits: Unchanged. Other: None. IMPRESSION: Persistent scattered small areas of acute subarachnoid hemorrhage over the right frontal and temporal regions without significant change. Possible faint acute subarachnoid hemorrhage over the left posterior temporal region. Tiny bilateral occipital subdural hematomas right greater than left measuring 3-4 mm in thickness. Small amount of subdural hemorrhage over the posterior falx just above the tentorium. No significant mass effect or midline shift. Known nondisplaced left  occipital bone fracture. Electronically Signed   By: Marin Olp M.D.   On: 11/10/2018 08:54   Ct Head Wo Contrast  Result Date: 11/09/2018 CLINICAL DATA:  Recent fall, initial encounter EXAM: CT HEAD WITHOUT CONTRAST TECHNIQUE: Contiguous axial images were obtained from the base of the skull through the vertex without intravenous contrast. COMPARISON:  08/25/2016 FINDINGS: Brain: There are small subdural hematomas along the left half of the tentorium cerebelli (3 mm in thickness) and to the left of the falx cerebri (4 mm in thickness). Additional small subdural hematomas are noted in the right parietal region and right frontal best seen on the coronal reconstructions. It measures 3 mm in thickness as well. Small areas of subarachnoid hemorrhage are noted with some parenchymal contusion in the right frontal lobe as well as within the right temporal lobe. Vascular: No hyperdense vessel or unexpected calcification. Skull: Undisplaced fracture is noted through the left occipital bone coursing adjacent to the foramen magnum. Old nasal bone fractures are noted bilaterally. Sinuses/Orbits: Within normal limits. Other: Scalp hematoma is noted over the  left occipital fracture. IMPRESSION: Areas of subarachnoid hemorrhage in the right frontal and right temporal lobes. These are likely related to coup-contrecoup mechanism from the left occipital injury. Multiple small subdural hematomas along the left tentorium cerebelli as well as just to the left of the midline along the falx near the vertex posteriorly as well as a small right parietal and right frontal lobe subdural hematomas. These measure 3-4 mm in thickness at each location in cause no significant mass effect at this time. Left occipital scalp hematoma with underlying occipital bone fracture. No significant displacement of the fracture is seen. Critical Value/emergent results were called by telephone at the time of interpretation on 11/09/2018 at 12:29 pm to Dr.  Lennice Sites , who verbally acknowledged these results. Electronically Signed   By: Inez Catalina M.D.   On: 11/09/2018 12:29   Vas US Carotid  Result Date: 11/09/2018 Carotid Arterial Duplex Study Indications:       Syncope. Risk Factors:      Diabetes. Comparison Study:  No prior studies. Performing Technologist: Oliver Hum RVT  Examination Guidelines: A complete evaluation includes B-mode imaging, spectral Doppler, color Doppler, and power Doppler as needed of all accessible portions of each vessel. Bilateral testing is considered an integral part of a complete examination. Limited examinations for reoccurring indications may be performed as noted.  Right Carotid Findings: +----------+--------+--------+--------+-----------------------+--------+             PSV cm/s EDV cm/s Stenosis Describe                Comments  +----------+--------+--------+--------+-----------------------+--------+  CCA Prox   82       8                                         tortuous  +----------+--------+--------+--------+-----------------------+--------+  CCA Distal 68       12                smooth and heterogenous           +----------+--------+--------+--------+-----------------------+--------+  ICA Prox   81       20                smooth and heterogenous           +----------+--------+--------+--------+-----------------------+--------+  ICA Distal 64       14                                                  +----------+--------+--------+--------+-----------------------+--------+  ECA        103      9                                                   +----------+--------+--------+--------+-----------------------+--------+ +----------+--------+-------+--------+-------------------+             PSV cm/s EDV cms Describe Arm Pressure (mmHG)  +----------+--------+-------+--------+-------------------+  Subclavian 153                                            +----------+--------+-------+--------+-------------------+  +---------+--------+--+--------+--+---------+  Vertebral PSV cm/s 52 EDV cm/s 11 Antegrade  +---------+--------+--+--------+--+---------+  Left Carotid Findings: +----------+--------+--------+--------+-----------------------+--------+             PSV cm/s EDV cm/s Stenosis Describe                Comments  +----------+--------+--------+--------+-----------------------+--------+  CCA Prox   82       11                smooth and heterogenous tortuous  +----------+--------+--------+--------+-----------------------+--------+  CCA Distal 65       7                 smooth and heterogenous           +----------+--------+--------+--------+-----------------------+--------+  ICA Prox   69       14                smooth and heterogenous           +----------+--------+--------+--------+-----------------------+--------+  ICA Distal 68       19                                        tortuous  +----------+--------+--------+--------+-----------------------+--------+  ECA        93       7                                                   +----------+--------+--------+--------+-----------------------+--------+ +----------+--------+--------+--------+-------------------+  Subclavian PSV cm/s EDV cm/s Describe Arm Pressure (mmHG)  +----------+--------+--------+--------+-------------------+             119                                             +----------+--------+--------+--------+-------------------+ +---------+--------+--+--------+-+---------+  Vertebral PSV cm/s 44 EDV cm/s 8 Antegrade  +---------+--------+--+--------+-+---------+  Summary: Right Carotid: Velocities in the right ICA are consistent with a 1-39% stenosis. Left Carotid: Velocities in the left ICA are consistent with a 1-39% stenosis. Vertebrals: Bilateral vertebral arteries demonstrate antegrade flow. *See table(s) above for measurements and observations.  Electronically signed by Servando Snare MD on 11/09/2018 at 4:09:43 PM.    Final     Review of Systems    Constitutional: Negative.   HENT: Positive for hearing loss. Negative for congestion, ear pain and tinnitus.   Eyes: Negative.   Respiratory: Negative.   Cardiovascular: Negative.   Gastrointestinal: Negative.   Genitourinary: Negative.   Musculoskeletal: Positive for back pain, falls and joint pain. Negative for myalgias and neck pain.  Skin: Negative.   Neurological: Positive for dizziness and headaches.  Endo/Heme/Allergies: Negative.   Psychiatric/Behavioral: Negative.    Blood pressure 122/63, pulse (!) 102, temperature 98.7 F (37.1 C), temperature source Oral, resp. rate 18, height 5\' 1"  (1.549 m), weight 52.6 kg, SpO2 96 %. Physical Exam  Constitutional: She is oriented to person, place, and time. She appears well-developed and well-nourished.  HENT:  Head: Normocephalic and atraumatic.  Eyes: Pupils are equal, round, and reactive to light. Conjunctivae and EOM are normal.  Neck: Normal range of motion. Neck supple.  Cardiovascular: Regular rhythm. Tachycardia present.  Respiratory: Effort normal.  GI: Soft. There is no abdominal tenderness.  Musculoskeletal: Normal range of motion.  Neurological: She is alert and oriented to person, place, and time. She has normal strength. No cranial nerve deficit. GCS eye subscore is 4. GCS verbal subscore is 5. GCS motor subscore is 6.  Skin: Skin is warm and dry.  Psychiatric: She has a normal mood and affect. Her behavior is normal. Thought content normal.    Assessment/Plan: Patient one day status post fall. Repeat CT 11/10/2018 demonstrates stable appearance of subdural hematomas and subarachnoid hemorrhages. She is able to discharge from a neurosurgical standpoint. She can follow up with Dr. Arnoldo Morale as an outpatient as needed.  Samantha Quinn 11/10/2018, 12:07 PM

## 2018-11-10 NOTE — TOC Transition Note (Signed)
Transition of Care [facility]) - CM/SW Discharge Note   Patient Details  Name: NELLI SWALLEY MRN: [id number] Date of Birth: 12-07-1927  [facility]) CM/SW Contact:  Geralynn Ochs, LCSW Phone Number: 11/10/2018, 2:25 PM   Clinical Narrative:   CSW met with patient to discuss discharge plans. Patient hopeful to discharge home today, not interested in home health services; does not feel that she needs it. Patient has children who are helpful and actively involved in her care, as well as neighbors who look out for her and help her if she needs. Patient says she has a solid support system and feels well cared for at home even though she lives alone. Has a walker at home already that she uses. No further needs identified.       Barriers to Discharge: No Barriers Identified   Patient Goals and CMS Choice Patient states their goals for this hospitalization and ongoing recovery are:: to get home as soon as possible   Choice offered to / list presented to : NA  Discharge Placement                       Discharge Plan and Services In-house Referral: NA Discharge Planning Services: NA                                 Social Determinants of Health (SDOH) Interventions     Readmission Risk Interventions No flowsheet data found.

## 2018-11-10 NOTE — Evaluation (Signed)
Occupational Therapy Evaluation Patient Details Name: Samantha Quinn MRN: [id number] DOB: 1927-12-07 Today's Date: 11/10/2018    History of Present Illness Pt is an 85 yo female s/p fall with dizziness. CT head showing known small SAH and SDH with occipital bone fx. Pt PMHX: lung CA, NIDDM, HTN, h/o vertigo, back pain.   Clinical Impression   Pt PTA: living alone with supportive family nearby. Pt reports independence with driving, ADL and mobility. Pt currently modified independent with ADL, mobility with RW and safety. Pt with good safety awareness. Pt has RW at home if she feels unsteady. No focal deficits noted. Vision, WFLs. Pt scored 0/28 on Short Blessed Test for cognition and dementia screening- pt with 100% accuracy for counting, recall immediately and after 3 mins, and saying the months of year in reverse. Pt A/O x4. pt following all commands. Pt does not require acute OT. Pt could benefit from [facility] therapies for safety eval and progression of HEP (up to patient- she initially denied need). OT signing off.     Follow Up Recommendations  Home health OT;Supervision - Intermittent(if pt feels she needs to increase strength/safety.)    Equipment Recommendations  None recommended by OT    Recommendations for Other Services       Precautions / Restrictions Precautions Precautions: None Restrictions Weight Bearing Restrictions: No      Mobility Bed Mobility Overal bed mobility: Modified Independent             General bed mobility comments: no physical assist required  Transfers Overall transfer level: Modified independent Equipment used: Rolling walker (2 wheeled)             General transfer comment: Pt used RW and then did not use it, she preferred walking with it, but appeared WFLs for current ADL.    Balance Overall balance assessment: Modified Independent                                         ADL either performed or assessed with  clinical judgement   ADL Overall ADL's : Modified independent                                       General ADL Comments: Pt requiring no physical assist      Vision Baseline Vision/History: No visual deficits;Wears glasses Wears Glasses: At all times Vision Assessment?: Yes Eye Alignment: Within Functional Limits Ocular Range of Motion: Within Functional Limits Alignment/Gaze Preference: Within Defined Limits Tracking/Visual Pursuits: Able to track stimulus in all quads without difficulty Saccades: Within functional limits Convergence: Within functional limits Additional Comments: no focal vision deficits     Perception     Praxis      Pertinent Vitals/Pain Pain Assessment: No/denies pain     Hand Dominance Right   Extremity/Trunk Assessment Upper Extremity Assessment Upper Extremity Assessment: Overall WFL for tasks assessed   Lower Extremity Assessment Lower Extremity Assessment: Overall WFL for tasks assessed   Cervical / Trunk Assessment Cervical / Trunk Assessment: Normal   Communication Communication Communication: No difficulties   Cognition Arousal/Alertness: Awake/alert Behavior During Therapy: WFL for tasks assessed/performed Overall Cognitive Status: Within Functional Limits for tasks assessed  General Comments: Pt scored 0/28 on Short Blessed Test for cognition and dementia screening- pt with 100% accuracy for counting, recall immediately and after 3 mins, and saying the months of year in reverse. Pt A/O x4. pt following all commands.   General Comments  Pt performing transfer to commode, toilet hygiene and standing at sink for ADL with no physical assist required. Pt with good safety awareness. Pt with no dizziness reported with positional change. Pt tolerating session really well. Pt reports "I feel back to myself."    Exercises     Shoulder Instructions      Home Living  Family/patient expects to be discharged to:: Private residence Living Arrangements: Alone Available Help at Discharge: Family;Available 24 hours/day Type of Home: Other(Comment)(condo) Home Access: Level entry     Home Layout: One level     Bathroom Shower/Tub: Occupational psychologist: Handicapped height     Home Equipment: Environmental consultant - 2 wheels;Walker - 4 wheels;Bedside commode          Prior Functioning/Environment Level of Independence: Independent        Comments: driving and performing house duties        OT Problem List:        OT Treatment/Interventions:      OT Goals(Current goals can be found in the care plan section) Acute Rehab OT Goals OT Goal Formulation: All assessment and education complete, DC therapy  OT Frequency:     Barriers to D/C:            Co-evaluation              AM-PAC OT "6 Clicks" Daily Activity     Outcome Measure Help from another person eating meals?: None Help from another person taking care of personal grooming?: None Help from another person toileting, which includes using toliet, bedpan, or urinal?: None Help from another person bathing (including washing, rinsing, drying)?: None Help from another person to put on and taking off regular upper body clothing?: None Help from another person to put on and taking off regular lower body clothing?: None 6 Click Score: 24   End of Session Equipment Utilized During Treatment: Gait belt;Rolling walker Nurse Communication: Mobility status  Activity Tolerance: Patient tolerated treatment well Patient left: in chair;with call bell/phone within reach;with chair alarm set  OT Visit Diagnosis: Unsteadiness on feet (R26.81)                Time: 3244-0102 OT Time Calculation (min): 35 min Charges:  OT General Charges $OT Visit: 1 Visit OT Evaluation $OT Eval Moderate Complexity: 1 Mod OT Treatments $Self Care/Home Management : 8-22 mins  Ebony Hail Harold Hedge) Marsa Aris  OTR/L Acute Rehabilitation Services Pager: [phone] Office: [place] 11/10/2018, 1:29 PM

## 2018-11-10 NOTE — Progress Notes (Signed)
PT Cancellation Note and Discharge  Patient Details Name: Samantha Quinn MRN: [id number] DOB: May 07, 1927   Cancelled Treatment:    Reason Eval/Treat Not Completed: PT screened, no needs identified, will sign off. Discussed pt case with OT who states pt is back to baseline of function and is currently functioning at a mod I level. Will sign off at this time. If needs change, please reconsult.   Thelma Comp 11/10/2018, 1:17 PM   Rolinda Roan, PT, DPT Acute Rehabilitation Services Pager: [phone] Office: [phone]

## 2018-11-10 NOTE — Progress Notes (Signed)
Spoke with daughter [name] and answered questions and updated her about plan so far today.  Beth did ask that MD call her after he sees her mom so this nurse did send a not to Dr Tawanna Solo to follow up.

## 2018-11-11 ENCOUNTER — Ambulatory Visit (INDEPENDENT_AMBULATORY_CARE_PROVIDER_SITE_OTHER): Payer: Medicare Other | Admitting: Internal Medicine

## 2018-11-11 ENCOUNTER — Encounter: Payer: Self-pay | Admitting: Internal Medicine

## 2018-11-11 ENCOUNTER — Telehealth: Payer: Self-pay | Admitting: Internal Medicine

## 2018-11-11 DIAGNOSIS — R42 Dizziness and giddiness: Secondary | ICD-10-CM | POA: Diagnosis not present

## 2018-11-11 DIAGNOSIS — I10 Essential (primary) hypertension: Secondary | ICD-10-CM | POA: Diagnosis not present

## 2018-11-11 DIAGNOSIS — R040 Epistaxis: Secondary | ICD-10-CM

## 2018-11-11 DIAGNOSIS — S065XAA Traumatic subdural hemorrhage with loss of consciousness status unknown, initial encounter: Secondary | ICD-10-CM

## 2018-11-11 DIAGNOSIS — S065X9A Traumatic subdural hemorrhage with loss of consciousness of unspecified duration, initial encounter: Secondary | ICD-10-CM

## 2018-11-11 MED ORDER — MECLIZINE HCL 12.5 MG PO TABS: 12.5000 mg | ORAL_TABLET | Freq: Three times a day (TID) | ORAL | 1 refills | Status: AC | PRN

## 2018-11-11 NOTE — Progress Notes (Signed)
Subjective:    Patient ID: Samantha Quinn, female    DOB: 19-Feb-1928, 85 y.o.   MRN: [id number]  HPI The patient is here for follow up from the hospital.  Admitted 7/15-7/16.  She has a history of metastatic lung cancer and chronic dizziness who had fallen the morning she went to the emergency room.  She did not recall falling, but does remember feeling dizzy that she describes as a lightheadedness.  The fall was witnessed by lawn maintenance personnel who called EMS.  She did not feel groggy after the fall.  She denied any other concerning symptoms.  She did have nausea and an episode of emesis after the fall.  ED: Blood work shows leukocytosis of 17, potassium 3.2.  Urinalysis negative.  Chest x-ray was unchanged.  EKG showed normal sinus rhythm.  PT/INR normal.  COVID 19 neg. CT of the head showed subarachnoid hemorrhage, cysts multiple small subdural hematomas without mass-effect, left occipital scalp hematoma with underlying occipital bone fracture without significant displacement.  Case was discussed with neurosurgery who advised admission and repeat CT of the head in the morning.  She received normal saline, meclizine and Compazine.  She remained stable in the ED hemodynamically.  Repeat CT of the head the day of discharge showed no progression of subdural hematoma.  She remained alert and orientated.  She denied headaches or dizziness.  OT/PT did see her without recommended follow-up.  She was advised to follow-up with neurosurgery as an outpatient.  She was discharged home.  Clear blood tinged fluid dripping from nose:  This started last night.   It continues to drip.  It tends to drip if she moves around.  She denies any pain.  She denies nasal congestion.   Her daughter did call the neurosurgery office and this is not related to her brain bleeds.    Dizziness /lightheadedness - meclizine helps.  It occurs with head movements sometimes,but not always.  If she bends over she will fall  over.  Her daughter is concerned about metastatic cancer.    Hypertension:  She was not given her BP medications while in the hospital.  She did take her medication this morning.  It has typically been well controlled.     Medications and allergies reviewed with patient and updated if appropriate.  Patient Active Problem List   Diagnosis Date Noted  . Intracranial bleed ([place]) 11/09/2018  . Vitamin B 12 deficiency 03/10/2018  . Goals of care, counseling/discussion 11/12/2017  . IDA (iron deficiency anemia) 03/12/2017  . Radiculopathy 02/17/2017  . Lung cancer metastatic to bone ([place]) 02/05/2017  . Thyrotoxicosis without thyroid storm 01/18/2017  . Bilateral hearing loss 08/27/2016  . Knee injury 08/27/2016  . Toxic multinodular goiter w/o crisis 06/22/2016  . Fatigue 05/05/2016  . Macular degeneration 03/09/2016  . Encounter for antineoplastic chemotherapy 12/19/2015  . Meralgia paresthetica of left side 07/10/2015  . Osteoporosis 07/10/2015  . Onychomycosis 07/10/2015  . Diabetes ([place]) 07/10/2015  . Cough 12/12/2013  . Pneumothorax after biopsy 07/09/2011    Class: Acute  . Primary adenocarcinoma of lower lobe of right lung ([place]) 06/24/2011  . Hypercholesterolemia 10/20/2010  . Essential hypertension, benign 10/20/2010  . Osteoarthritis 10/20/2010    Current Outpatient Medications on File Prior to Visit  Medication Sig Dispense Refill  . acetaminophen (TYLENOL) 500 MG tablet Take 500 mg by mouth every 6 (six) hours as needed for mild pain.    [name] amitriptyline (ELAVIL) 10 MG tablet TAKE 1  TABLET BY MOUTH EVERYDAY AT BEDTIME (Patient taking differently: Take 5 mg by mouth at bedtime. ) 90 tablet 1  . amLODipine (NORVASC) 5 MG tablet Take 1 tablet (5 mg total) by mouth daily. (Patient taking differently: Take 5 mg by mouth every other day. ) 90 tablet 1  . bisoprolol-hydrochlorothiazide (ZIAC) 2.5-6.25 MG tablet TAKE 1 TABLET BY MOUTH EVERY DAY 90 tablet 1  . Blood Glucose  Monitoring Suppl (ONE TOUCH ULTRA 2) w/Device KIT Use to check blood sugars twice a day Dx E11.9 1 each 0  . cholecalciferol (VITAMIN D) 1000 units tablet Take 2,000 Units by mouth daily.     [name] gabapentin (NEURONTIN) 100 MG capsule Take 100 mg by mouth every evening.     [name] glucose blood (ONE TOUCH ULTRA TEST) test strip 1 each by Other route 2 (two) times daily. Use to check blood sugars twice a day 100 each 3  . HYDROcodone-homatropine (HYCODAN) 5-1.5 MG/5ML syrup Take 5 mLs by mouth every 6 (six) hours as needed for cough. (Patient taking differently: Take 2.5 mLs by mouth every 6 (six) hours as needed for cough. ) 240 mL 0  . Lancets (ONETOUCH ULTRASOFT) lancets 1 each by Other route 2 (two) times daily. Use to help check blood sugars twice a day 100 each 3  . lovastatin (MEVACOR) 20 MG tablet TAKE 1 TABLET BY MOUTH EVERY OTHER DAY 45 tablet 3  . metFORMIN (GLUCOPHAGE) 500 MG tablet Take 1 tablet (500 mg total) by mouth 2 (two) times daily with a meal. 180 tablet 1  . methimazole (TAPAZOLE) 5 MG tablet Take 2.5 mg every other day 45 tablet 2  . Multiple Vitamins-Minerals (VISION-VITE PRESERVE PO) Take 1 tablet by mouth daily.     . polyethylene glycol (MIRALAX / GLYCOLAX) 17 g packet Take 17 g by mouth daily as needed for mild constipation. 14 each 0  . senna (SENOKOT) 8.6 MG TABS tablet Take 1 tablet (8.6 mg total) by mouth 2 (two) times daily. 30 tablet 0  . [DISCONTINUED] prochlorperazine (COMPAZINE) 10 MG tablet Take 1 tablet (10 mg total) by mouth every 6 (six) hours as needed (Nausea or vomiting). 30 tablet 1   No current facility-administered medications on file prior to visit.     Past Medical History:  Diagnosis Date  . Anxiety   . Back pain    bulding disc  . Bronchitis    hx of-many,many years   . Diabetes mellitus   . Diabetes mellitus    type 2 and takes Metformin bid  . Dyslipidemia   . Embolism - blood clot    hx of in left lower leg and was on Coumadin for about  62month;this was about 8-133yrago  . Family history of adverse reaction to anesthesia    Daughter- Nausea  . Goals of care, counseling/discussion 11/12/2017  . History of colonic polyps   . History of radiation therapy 01/06/16-02/19/16   The primary tumor and involved mediastinal adenopathy were treated to 66 Gy in 33 fractions of 2 Gy.  . Marland Kitchenistory of radiation therapy 04/01/17-04/26/17   cervical spine 35 Gy in 14 fractions  . HOH (hard of hearing)   . Hx of migraines    as a teenager  . Hypercholesterolemia   . Hyperlipidemia    takes Lovastatin every other day  . Hypertension    takes Ziac daily and Ramipril as well  . Hyperthyroidism   . Hypothyroidism    takes Synthroid every other day  .  Insomnia    takes Elavil nightly  . Joint pain   . Lung mass   . Myalgia   . Osteoarthritis   . Osteoporosis   . Pneumothorax of right lung after biopsy 07/09/11   HAD CHEST TUBE PLACEMENT  . PONV (postoperative nausea and vomiting)   . Primary adenocarcinoma of lower lobe of right lung ([place]) 06/24/2011   pT2a, pN0 M0 Stage 1 Well differenced Adenocarcinoma 3.5 cm resected 07/17/2011  . Primary adenocarcinoma of lung ([place]) 06/24/2011  . Shortness of breath dyspnea    With exertion  . Skin cancer    legs have dark spots on them  . Upper respiratory infection 04/2011  . Urinary incontinence    takes Detrol daily prn    Past Surgical History:  Procedure Laterality Date  . ABDOMINAL HYSTERECTOMY    . ANTERIOR CERVICAL DECOMP/DISCECTOMY FUSION N/A 02/17/2017   Procedure: ANTERIOR CERVICAL DECOMPRESSION FUSION, CERVICAL 6-7 WITH INSTRUMENTATION AND ALLOGRAFT;  Surgeon: Phylliss Bob, MD;  Location: [place];  Service: Orthopedics;  Laterality: N/A;  ANTERIOR CERVICAL DECOMPRESSION FUSION, CERVICAL 6-7 WITH INSTRUMENTATION AND ALLOGRAFT; REQUEST 2 HOURS AND FLIP   . APPENDECTOMY     as a child  . CHEST TUBE REMOVAL  07/13/11   RIGHT  CHEST TUBE  . CHOLECYSTECTOMY    . COLONOSCOPY    .  COLONOSCOPY    . DILATION AND CURETTAGE OF UTERUS     x 2  . EYE SURGERY Bilateral    Cataract  . HEMORRHOID SURGERY    . IR THORACENTESIS ASP PLEURAL SPACE W/IMG GUIDE  02/03/2018  . LUNG BIOPSY  07/09/11   RIGHT UPER LOBE LUNG MASS   . LUNG LOBECTOMY Right 06/2011  . POSTERIOR CERVICAL FUSION/FORAMINOTOMY N/A 02/18/2017   Procedure: CERVICAL Five-Six, CERVICAL Six-Seven, CERVICAL Seven-THORACIC One, THORACIC One-Two POSTERIOR SPINAL FUSION WITH INSTRUMENTATION AND ALLOGRAFT;  Surgeon: Phylliss Bob, MD;  Location: [facility];  Service: Orthopedics;  Laterality: N/A;  . RIGHT CHEST TUBE PLACEMENT  07/10/11   S/P LUNG BX  . ROTATOR CUFF REPAIR     bilateral  . TOE SURGERY  2013   right small toe  . TONSILLECTOMY     as a child  . VIDEO BRONCHOSCOPY  07/17/2011   Procedure: VIDEO BRONCHOSCOPY;  Surgeon: Grace Isaac, MD;  Location: [facility];  Service: Thoracic;  Laterality: N/A;  . VIDEO BRONCHOSCOPY WITH ENDOBRONCHIAL ULTRASOUND N/A 12/10/2015   Procedure: VIDEO BRONCHOSCOPY WITH ENDOBRONCHIAL ULTRASOUND with biopsies.;  Surgeon: Grace Isaac, MD;  [facility];  Service: Thoracic;  Laterality: N/A;    Social History   Socioeconomic History  . Marital status: Widowed    Spouse name: Not on file  . Number of children: 3  . Years of education: Not on file  . Highest education level: Not on file  Occupational History  . Not on file  Social Needs  . Financial resource strain: Not hard at all  . Food insecurity    Worry: Never true    Inability: Never true  . Transportation needs    Medical: No    Non-medical: No  Tobacco Use  . Smoking status: Never Smoker  . Smokeless tobacco: Never Used  Substance and Sexual Activity  . Alcohol use: No    Alcohol/week: 0.0 standard drinks    Comment: "rarely have a glass of wine"  . Drug use: No  . Sexual activity: Never    Birth control/protection: Surgical  Lifestyle  . Physical activity  Days per week: 0 days    Minutes  per session: 0 min  . Stress: Not at all  Relationships  . Social connections    Talks on phone: More than three times a week    Gets together: More than three times a week    Attends religious service: More than 4 times per year    Active member of club or organization: Yes    Attends meetings of clubs or organizations: More than 4 times per year    Relationship status: Widowed  Other Topics Concern  . Not on file  Social History Narrative  . Not on file    Family History  Problem Relation Age of Onset  . Hypertension Mother   . Hypertension Father   . Arthritis Sister   . Arthritis Sister   . Diabetes Brother   . Hypertension Sister   . Anesthesia problems Neg Hx   . Hypotension Neg Hx   . Malignant hyperthermia Neg Hx   . Pseudochol deficiency Neg Hx   . Heart attack Neg Hx     Review of Systems  Constitutional: Negative for chills and fever.  HENT: Positive for nosebleeds. Negative for congestion.   Eyes: Negative for visual disturbance.  Gastrointestinal: Negative for nausea.  Endocrine: Positive for cold intolerance.  Musculoskeletal: Positive for gait problem (feels unstable).  Neurological: Positive for dizziness and light-headedness. Negative for headaches.       Objective:   Vitals:   11/11/18 1453  BP: (!) 160/83  Pulse: 64  Resp: (!) 22  Temp: 98.3 F (36.8 C)   BP Readings from Last 3 Encounters:  11/11/18 (!) 160/83  11/10/18 130/65  10/27/18 137/65   Wt Readings from Last 3 Encounters:  11/11/18 113 lb (51.3 kg)  11/10/18 115 lb 15.4 oz (52.6 kg)  10/27/18 116 lb (52.6 kg)   Body mass index is 21.35 kg/m.   Physical Exam    Constitutional: Appears well-developed and well-nourished. No distress.  HENT:  Head: Normocephalic and atraumatic.  Neck: Neck supple. No tracheal deviation present. No thyromegaly present.  No cervical lymphadenopathy Nose:  Right nasal passageway w/o active discharge or bleeding - no scab or trauma evidence  Cardiovascular: Normal rate, regular rhythm and normal heart sounds.   No murmur heard. No carotid bruit .  No edema Pulmonary/Chest: Effort normal and breath sounds normal. No respiratory distress. No has no wheezes. No rales.  Skin: Skin is warm and dry. Not diaphoretic.  Psychiatric: Normal mood and affect. Behavior is normal.   CT HEAD WO CONTRAST CLINICAL DATA:  Altered mental status, falls.  EXAM: CT HEAD WITHOUT CONTRAST  TECHNIQUE: Contiguous axial images were obtained from the base of the skull through the vertex without intravenous contrast.  COMPARISON:  11/09/2018  FINDINGS: Brain: Ventricles and cisterns are normal. Remaining CSF spaces are within normal. There are scattered foci of acute subarachnoid hemorrhage over the right frontal and temporal regions without significant change. Possible mild subarachnoid hemorrhage over the left posterior temporal region tiny amount of acute subdural hemorrhage over the bilateral occipital regions right worse than left measuring up to 3-4 mm in thickness. Possible small amount of acute subdural hemorrhage over the lower posterior falx just above the tentorium. Previously seen small amount of subdural hemorrhage over the higher posterior falx not seen currently. No significant mass effect or midline shift. No evidence of focal mass. No evidence of acute infarction. Oval 1 cm hypodensity over the periphery of the left cerebellar  hemisphere.  Vascular: No hyperdense vessel or unexpected calcification.  Skull: Known nondisplaced left occipital bone fracture.  Sinuses/Orbits: Unchanged.  Other: None.  IMPRESSION: Persistent scattered small areas of acute subarachnoid hemorrhage over the right frontal and temporal regions without significant change. Possible faint acute subarachnoid hemorrhage over the left posterior temporal region. Tiny bilateral occipital subdural hematomas right greater than left measuring 3-4 mm in  thickness. Small amount of subdural hemorrhage over the posterior falx just above the tentorium. No significant mass effect or midline shift.  Known nondisplaced left occipital bone fracture.  Electronically Signed   By: Marin Olp M.D.   On: 11/10/2018 08:54     Assessment & Plan:    See Problem List for Assessment and Plan of chronic medical problems.

## 2018-11-11 NOTE — Telephone Encounter (Signed)
Pt dtr [facility]) contacted and informed of same.   Appt for 230 has been scheduled. Told Beth to keep it in case the neurosurgeon is not helpful.

## 2018-11-11 NOTE — Telephone Encounter (Signed)
Documented in [place] message.

## 2018-11-11 NOTE — Patient Instructions (Signed)
Monitor your BP at home - if it is not controlled please call.    Take the meclizine as needed for the dizziness.    If your nose bleeding continues please let me know.    Call with any concerning symptoms.

## 2018-11-11 NOTE — Telephone Encounter (Signed)
Daughter called stating that pt needed a hospital follow up from a recent discharge to a fall.  I have scheduled pt for this afternoon.   Daughter states that there is a pink liquid that has been draining from patients nostrils since she has been home (since yesterday).   Daughter wanted to make sure that Dr. Quay Burow wanted patient to come into the office or if there was another course of action that needed to be followed? Please follow up with daughter in regard.

## 2018-11-12 DIAGNOSIS — R040 Epistaxis: Secondary | ICD-10-CM | POA: Insufficient documentation

## 2018-11-12 DIAGNOSIS — S065X9A Traumatic subdural hemorrhage with loss of consciousness of unspecified duration, initial encounter: Secondary | ICD-10-CM | POA: Insufficient documentation

## 2018-11-12 DIAGNOSIS — S065XAA Traumatic subdural hemorrhage with loss of consciousness status unknown, initial encounter: Secondary | ICD-10-CM | POA: Insufficient documentation

## 2018-11-12 DIAGNOSIS — R42 Dizziness and giddiness: Secondary | ICD-10-CM | POA: Insufficient documentation

## 2018-11-12 NOTE — Assessment & Plan Note (Signed)
Multiple areas of subdural hematoma Repeat CT shows no change Has neurosurgery appt for follow up No headaches, changes in vision, nausea or confusion Her daughter will keep an eye on her and call if she has any concerning symptoms which we reviewed

## 2018-11-12 NOTE — Assessment & Plan Note (Signed)
Bleeding is from right side of nose - no evidence of active bleeding During the exam she mentioned that she did have the COVID test - this could be the cause of the bleeding She has discharge that is tinged with blood  -- no significant bleeding Platelet only slightly decreased  Bleeding and discharge likely from covid test - she will just monitor over the weekend - no need to seek treatment If it persist may need to see ENT but I would expect this to stop on its own

## 2018-11-12 NOTE — Assessment & Plan Note (Signed)
Having dizziness / lightheadedness - not a clear picture of when this occurs This is not new Meclizine does seem to help Meclizine prn

## 2018-11-12 NOTE — Assessment & Plan Note (Signed)
Elevated here today, but did not have her medication while in the hospital - she did take them today BP typically controlled on current medication She will monitor her BP at home and let me know if it does not improve in the next day

## 2018-11-13 ENCOUNTER — Encounter: Payer: Self-pay | Admitting: Hematology & Oncology

## 2018-11-13 ENCOUNTER — Encounter: Payer: Self-pay | Admitting: Internal Medicine

## 2018-11-14 ENCOUNTER — Encounter: Payer: Self-pay | Admitting: Internal Medicine

## 2018-11-15 ENCOUNTER — Encounter: Payer: Self-pay | Admitting: Internal Medicine

## 2018-11-16 ENCOUNTER — Encounter: Payer: Self-pay | Admitting: Internal Medicine

## 2018-11-21 ENCOUNTER — Other Ambulatory Visit: Payer: Self-pay | Admitting: *Deleted

## 2018-11-21 ENCOUNTER — Encounter: Payer: Self-pay | Admitting: Internal Medicine

## 2018-11-21 MED ORDER — ACETAMINOPHEN-CODEINE #3 300-30 MG PO TABS: 1.0000 | ORAL_TABLET | Freq: Three times a day (TID) | ORAL | 0 refills | Status: DC | PRN

## 2018-11-21 MED ORDER — HYDROCODONE-HOMATROPINE 5-1.5 MG/5ML PO SYRP: 5.0000 mL | ORAL_SOLUTION | Freq: Four times a day (QID) | ORAL | 0 refills | Status: DC | PRN

## 2018-11-21 MED FILL — HYDROCODONE-HOMATROPINE SOL: 5-1.5 | 12 days supply | Qty: 240 | Fill #0

## 2018-11-21 NOTE — Telephone Encounter (Signed)
Has pain near top of buttock regions, down buttocks in down IT band region.  Possibly from bed in hosp  Monitor skin for rash.   Try tylenol with codeine and lidocaine patch   Discussed with daughter - discussed risk of meds and possible SE - she thinks she will tolerate codeine despite in being an "allergy"

## 2018-11-21 NOTE — Telephone Encounter (Signed)
Spoke with pt to see what was going since she asked me to call her through my chart. She states that since her fall she has had lower back and bilateral leg pain. She has been taking tylenol extra strength for muscle aches. States it is not working and she is having trouble sleeping at night. Also states that her head has been feeling "whoozy". She would like something a little stronger for pain.

## 2018-11-22 ENCOUNTER — Other Ambulatory Visit: Payer: Self-pay | Admitting: Internal Medicine

## 2018-11-23 ENCOUNTER — Encounter: Payer: Self-pay | Admitting: Hematology & Oncology

## 2018-11-23 ENCOUNTER — Encounter: Payer: Self-pay | Admitting: Internal Medicine

## 2018-11-23 NOTE — Telephone Encounter (Signed)
Pls advise ok for refill.Marland KitchenJohny Quinn

## 2018-11-24 ENCOUNTER — Encounter: Payer: Self-pay | Admitting: Internal Medicine

## 2018-11-24 ENCOUNTER — Inpatient Hospital Stay: Payer: Medicare Other

## 2018-11-24 ENCOUNTER — Inpatient Hospital Stay (HOSPITAL_BASED_OUTPATIENT_CLINIC_OR_DEPARTMENT_OTHER): Payer: Medicare Other | Admitting: Hematology & Oncology

## 2018-11-24 ENCOUNTER — Encounter: Payer: Self-pay | Admitting: Hematology & Oncology

## 2018-11-24 ENCOUNTER — Other Ambulatory Visit: Payer: Self-pay

## 2018-11-24 VITALS — BP 130/47 | HR 74 | Temp 97.5°F | Resp 18 | Wt 110.0 lb

## 2018-11-24 DIAGNOSIS — D508 Other iron deficiency anemias: Secondary | ICD-10-CM

## 2018-11-24 DIAGNOSIS — C3431 Malignant neoplasm of lower lobe, right bronchus or lung: Secondary | ICD-10-CM

## 2018-11-24 DIAGNOSIS — R55 Syncope and collapse: Secondary | ICD-10-CM

## 2018-11-24 DIAGNOSIS — C7951 Secondary malignant neoplasm of bone: Secondary | ICD-10-CM | POA: Diagnosis not present

## 2018-11-24 DIAGNOSIS — R531 Weakness: Secondary | ICD-10-CM | POA: Diagnosis not present

## 2018-11-24 DIAGNOSIS — Z923 Personal history of irradiation: Secondary | ICD-10-CM | POA: Diagnosis not present

## 2018-11-24 DIAGNOSIS — M542 Cervicalgia: Secondary | ICD-10-CM | POA: Diagnosis not present

## 2018-11-24 DIAGNOSIS — Z9221 Personal history of antineoplastic chemotherapy: Secondary | ICD-10-CM | POA: Diagnosis not present

## 2018-11-24 DIAGNOSIS — M545 Low back pain: Secondary | ICD-10-CM | POA: Diagnosis not present

## 2018-11-24 LAB — CBC WITH DIFFERENTIAL (CANCER CENTER ONLY)
Abs Immature Granulocytes: 0.11 10*3/uL — ABNORMAL HIGH (ref 0.00–0.07)
Basophils Absolute: 0 10*3/uL (ref 0.0–0.1)
Basophils Relative: 0 %
Eosinophils Absolute: 0.1 10*3/uL (ref 0.0–0.5)
Eosinophils Relative: 1 %
HCT: 36 % (ref 36.0–46.0)
Hemoglobin: 11.5 g/dL — ABNORMAL LOW (ref 12.0–15.0)
Immature Granulocytes: 1 %
Lymphocytes Relative: 5 %
Lymphs Abs: 0.4 10*3/uL — ABNORMAL LOW (ref 0.7–4.0)
MCH: 29.9 pg (ref 26.0–34.0)
MCHC: 31.9 g/dL (ref 30.0–36.0)
MCV: 93.5 fL (ref 80.0–100.0)
Monocytes Absolute: 0.4 10*3/uL (ref 0.1–1.0)
Monocytes Relative: 5 %
Neutro Abs: 7.5 10*3/uL (ref 1.7–7.7)
Neutrophils Relative %: 88 %
Platelet Count: 179 10*3/uL (ref 150–400)
RBC: 3.85 MIL/uL — ABNORMAL LOW (ref 3.87–5.11)
RDW: 13.6 % (ref 11.5–15.5)
WBC Count: 8.6 10*3/uL (ref 4.0–10.5)
nRBC: 0 % (ref 0.0–0.2)

## 2018-11-24 LAB — CMP (CANCER CENTER ONLY)
ALT: 7 U/L (ref 0–44)
AST: 12 U/L — ABNORMAL LOW (ref 15–41)
Albumin: 4.1 g/dL (ref 3.5–5.0)
Alkaline Phosphatase: 197 U/L — ABNORMAL HIGH (ref 38–126)
Anion gap: 12 (ref 5–15)
BUN: 15 mg/dL (ref 8–23)
CO2: 25 mmol/L (ref 22–32)
Calcium: 8.8 mg/dL — ABNORMAL LOW (ref 8.9–10.3)
Chloride: 98 mmol/L (ref 98–111)
Creatinine: 0.61 mg/dL (ref 0.44–1.00)
GFR, Est AFR Am: >60 mL/min (ref >60)
GFR, Estimated: >60 mL/min (ref >60)
Glucose, Bld: 123 mg/dL — ABNORMAL HIGH (ref 70–99)
Potassium: 3.9 mmol/L (ref 3.5–5.1)
Sodium: 135 mmol/L (ref 135–145)
Total Bilirubin: 0.3 mg/dL (ref 0.3–1.2)
Total Protein: 6.7 g/dL (ref 6.5–8.1)

## 2018-11-24 MED ORDER — ZOLEDRONIC ACID 4 MG/5ML IV CONC
3.0000 mg | Freq: Once | INTRAVENOUS | Status: AC
  Administered 2018-11-24: 3 mg via INTRAVENOUS
  Filled 2018-11-24: qty 3.75

## 2018-11-24 MED ORDER — HYDROCODONE-ACETAMINOPHEN 5-300 MG PO TABS: 1.0000 | ORAL_TABLET | Freq: Four times a day (QID) | ORAL | 0 refills | Status: AC | PRN

## 2018-11-24 MED ORDER — HYDROCODONE-HOMATROPINE 5-1.5 MG/5ML PO SYRP: 5.0000 mL | ORAL_SOLUTION | Freq: Four times a day (QID) | ORAL | 0 refills | Status: DC | PRN

## 2018-11-24 MED FILL — HYDROCODON-APAP 5-300: 5-300 | 15 days supply | Qty: 60 | Fill #0

## 2018-11-24 MED FILL — HYDROCODONE-HOMATROPINE SOL: 5-1.5 | 12 days supply | Qty: 240 | Fill #0

## 2018-11-24 NOTE — Patient Instructions (Signed)
Zoledronic Acid injection (Hypercalcemia, Oncology) What is this medicine? ZOLEDRONIC ACID (ZOE le dron ik AS id) lowers the amount of calcium loss from bone. It is used to treat too much calcium in your blood from cancer. It is also used to prevent complications of cancer that has spread to the bone. This medicine may be used for other purposes; ask your health care provider or pharmacist if you have questions. COMMON BRAND NAME(S): Zometa What should I tell my health care provider before I take this medicine? They need to know if you have any of these conditions:  aspirin-sensitive asthma  cancer, especially if you are receiving medicines used to treat cancer  dental disease or wear dentures  infection  kidney disease  receiving corticosteroids like dexamethasone or prednisone  an unusual or allergic reaction to zoledronic acid, other medicines, foods, dyes, or preservatives  pregnant or trying to get pregnant  breast-feeding How should I use this medicine? This medicine is for infusion into a vein. It is given by a health care professional in a hospital or clinic setting. Talk to your pediatrician regarding the use of this medicine in children. Special care may be needed. Overdosage: If you think you have taken too much of this medicine contact a poison control center or emergency room at once. NOTE: This medicine is only for you. Do not share this medicine with others. What if I miss a dose? It is important not to miss your dose. Call your doctor or health care professional if you are unable to keep an appointment. What may interact with this medicine?  certain antibiotics given by injection  NSAIDs, medicines for pain and inflammation, like ibuprofen or naproxen  some diuretics like bumetanide, furosemide  teriparatide  thalidomide This list may not describe all possible interactions. Give your health care provider a list of all the medicines, herbs, non-prescription  drugs, or dietary supplements you use. Also tell them if you smoke, drink alcohol, or use illegal drugs. Some items may interact with your medicine. What should I watch for while using this medicine? Visit your doctor or health care professional for regular checkups. It may be some time before you see the benefit from this medicine. Do not stop taking your medicine unless your doctor tells you to. Your doctor may order blood tests or other tests to see how you are doing. Women should inform their doctor if they wish to become pregnant or think they might be pregnant. There is a potential for serious side effects to an unborn child. Talk to your health care professional or pharmacist for more information. You should make sure that you get enough calcium and vitamin D while you are taking this medicine. Discuss the foods you eat and the vitamins you take with your health care professional. Some people who take this medicine have severe bone, joint, and/or muscle pain. This medicine may also increase your risk for jaw problems or a broken thigh bone. Tell your doctor right away if you have severe pain in your jaw, bones, joints, or muscles. Tell your doctor if you have any pain that does not go away or that gets worse. Tell your dentist and dental surgeon that you are taking this medicine. You should not have major dental surgery while on this medicine. See your dentist to have a dental exam and fix any dental problems before starting this medicine. Take good care of your teeth while on this medicine. Make sure you see your dentist for regular follow-up   appointments. What side effects may I notice from receiving this medicine? Side effects that you should report to your doctor or health care professional as soon as possible:  allergic reactions like skin rash, itching or hives, swelling of the face, lips, or tongue  anxiety, confusion, or depression  breathing problems  changes in vision  eye  pain  feeling faint or lightheaded, falls  jaw pain, especially after dental work  mouth sores  muscle cramps, stiffness, or weakness  redness, blistering, peeling or loosening of the skin, including inside the mouth  trouble passing urine or change in the amount of urine Side effects that usually do not require medical attention (report to your doctor or health care professional if they continue or are bothersome):  bone, joint, or muscle pain  constipation  diarrhea  fever  hair loss  irritation at site where injected  loss of appetite  nausea, vomiting  stomach upset  trouble sleeping  trouble swallowing  weak or tired This list may not describe all possible side effects. Call your doctor for medical advice about side effects. You may report side effects to FDA at 1-800-FDA-1088. Where should I keep my medicine? This drug is given in a hospital or clinic and will not be stored at home. NOTE: This sheet is a summary. It may not cover all possible information. If you have questions about this medicine, talk to your doctor, pharmacist, or health care provider.  2020 Elsevier/Gold Standard (2013-09-09 14:19:39)  

## 2018-11-24 NOTE — Progress Notes (Signed)
Hematology and Oncology Follow Up Visit  SAPNA PADRON [id number] 04-Aug-1927 85 y.o. 11/24/2018   Principle Diagnosis:  Stage IV adenocarcinoma of the right lung-mutation negative C6-7 left nerve root compression secondary to metastatic disease - PD-L1 (0%) /TMB of 9  Past Therapy: Status post radiation/chemotherapy for recurrence-completed October 2017 Status post right upper lobectomy in March 2013 for stage IB disease XRT to the cervical spine - started 04/02/2017  Tecentriq 1200 mg IV every 3 weeks s/p cycle 8 -- d/c progression  Current Therapy:   Alimta/Avastin s/p cycle #5 -- d/c on 05/06/2018 Zometa 4 mg IV q4 weeks -next dose on  August 2020    Interim History:  Ms. Bicknell is here today for follow-up.  She has had some issues since we last saw her.  She was hospitalized couple weeks ago.  She apparently passed out.  She fell and hit the back of her head.  She sustained a small subdural hematoma.  This did not require any surgery.  I think she also sustained a fracture of 1 of her skull bones.  I do not think this is anything related to her metastatic lung cancer.  Did not find any CNS metastasis.  She has been followed by neurosurgery.  She is being followed by neurology.  She still has some weakness.  I think that she probably would benefit from physical therapy.  I will have to see about getting this set up for her to try to help with her strength and balance.  Her appetite is slowly coming back.  She lost 6 pounds since she was in the hospital.  She has had some lower back discomfort.  I know she has had arthritis in the back.  I know she has had radiation therapy to the lumbosacral region.  There is been no fever.  She has had no change in bowel or bladder habits.  She has had no leg swelling.  There has been no nausea or vomiting.  Overall, her performance  status is ECOG 2.    Medications:    Allergies:  Allergies  Allergen Reactions  . Ramipril Other  (See Comments)    Tongue swelling  . Tramadol Other (See Comments)    syncope syncope  . Codeine Nausea And Vomiting  . Demerol Nausea Only    Past Medical History, Surgical history, Social history, and Family History were reviewed and updated.  Review of Systems: Review of Systems  Constitutional: Negative.   HENT: Negative.   Eyes: Negative.   Respiratory: Negative.   Cardiovascular: Negative.   Gastrointestinal: Negative.   Genitourinary: Negative.   Musculoskeletal: Positive for back pain and neck pain.  Skin: Negative.   Neurological: Negative.   Endo/Heme/Allergies: Negative.   Psychiatric/Behavioral: Negative.      Physical Exam:  weight is 110 lb (49.9 kg). Her oral temperature is 97.5 F (36.4 C) (abnormal). Her blood pressure is 130/47 (abnormal) and her pulse is 74. Her respiration is 18 and oxygen saturation is 100%.   Wt Readings from Last 3 Encounters:  11/24/18 110 lb (49.9 kg)  11/11/18 113 lb (51.3 kg)  11/10/18 115 lb 15.4 oz (52.6 kg)    Physical Exam Vitals signs reviewed.  HENT:     Head: Normocephalic and atraumatic.  Eyes:     Pupils: Pupils are equal, round, and reactive to light.  Neck:     Musculoskeletal: Normal range of motion.  Cardiovascular:     Rate and Rhythm: Normal rate and regular  rhythm.     Heart sounds: Normal heart sounds.  Pulmonary:     Effort: Pulmonary effort is normal.     Breath sounds: Normal breath sounds.  Abdominal:     General: Bowel sounds are normal.     Palpations: Abdomen is soft.  Musculoskeletal: Normal range of motion.        General: No tenderness or deformity.  Lymphadenopathy:     Cervical: No cervical adenopathy.  Skin:    General: Skin is warm and dry.     Findings: No erythema or rash.  Neurological:     Mental Status: She is alert and oriented to person, place, and time.  Psychiatric:        Behavior: Behavior normal.        Thought Content: Thought content normal.        Judgment:  Judgment normal.      Lab Results  Component Value Date   WBC 8.6 11/24/2018   HGB 11.5 (L) 11/24/2018   HCT 36.0 11/24/2018   MCV 93.5 11/24/2018   PLT 179 11/24/2018   Lab Results  Component Value Date   FERRITIN 667 (H) 10/27/2018   IRON 66 10/27/2018   TIBC 268 10/27/2018   UIBC 201 10/27/2018   IRONPCTSAT 25 10/27/2018   Lab Results  Component Value Date   RETICCTPCT 2.1 05/06/2018   RBC 3.85 (L) 11/24/2018   No results found for: KPAFRELGTCHN, LAMBDASER, KAPLAMBRATIO No results found for: IGGSERUM, IGA, IGMSERUM No results found for: Odetta Pink, SPEI   Chemistry      Component Value Date/Time   NA 135 11/24/2018 1121   NA 144 04/07/2017 1215   NA 135 (L) 03/11/2017 1416   K 3.9 11/24/2018 1121   K 4.0 04/07/2017 1215   K 3.9 03/11/2017 1416   CL 98 11/24/2018 1121   CL 103 04/07/2017 1215   CO2 25 11/24/2018 1121   CO2 29 04/07/2017 1215   CO2 23 03/11/2017 1416   BUN 15 11/24/2018 1121   BUN 13 04/07/2017 1215   BUN 16.1 03/11/2017 1416   CREATININE 0.61 11/24/2018 1121   CREATININE 0.8 04/07/2017 1215   CREATININE 0.9 03/11/2017 1416      Component Value Date/Time   CALCIUM 8.8 (L) 11/24/2018 1121   CALCIUM 9.6 04/07/2017 1215   CALCIUM 9.6 03/11/2017 1416   ALKPHOS 197 (H) 11/24/2018 1121   ALKPHOS 60 04/07/2017 1215   ALKPHOS 79 03/11/2017 1416   AST 12 (L) 11/24/2018 1121   AST 11 03/11/2017 1416   ALT 7 11/24/2018 1121   ALT 15 04/07/2017 1215   ALT 11 03/11/2017 1416   BILITOT 0.3 11/24/2018 1121   BILITOT 0.26 03/11/2017 1416      Impression and Plan: Ms. Hosek is a very pleasant 85 yo caucasian female with progressive metastatic non small cell adenocarcinoma of the lung.  I just feel bad that she had this episode where she had to be in the hospital.  Thankfully, she did not require any surgery.  Thankfully there is no obvious neurological deficits that she has.  I do think  she needs an echocardiogram.  This should have been done when she was in the hospital but she was discharged.  She does need a PET scan so we can see how things look with her lung cancer.  Hopefully, physical therapy will help her.  I spent about 45 minutes with she and her daughter.  1  of her daughters had come in with her today to try to help with respect to her memory and also with her weakness.  I will have her come back to see [place] in another month or so.  [name], MD 7/30/202012:29 PM

## 2018-11-25 ENCOUNTER — Encounter: Payer: Self-pay | Admitting: Hematology & Oncology

## 2018-11-25 LAB — FERRITIN: Ferritin: 1021 ng/mL — ABNORMAL HIGH (ref 11–307)

## 2018-11-25 LAB — IRON AND TIBC
Iron: 57 ug/dL (ref 41–142)
Saturation Ratios: 21 % (ref 21–57)
TIBC: 273 ug/dL (ref 236–444)
UIBC: 216 ug/dL (ref 120–384)

## 2018-11-27 ENCOUNTER — Encounter: Payer: Self-pay | Admitting: Hematology & Oncology

## 2018-11-29 DIAGNOSIS — S0291XD Unspecified fracture of skull, subsequent encounter for fracture with routine healing: Secondary | ICD-10-CM | POA: Diagnosis not present

## 2018-11-29 DIAGNOSIS — S066X1D Traumatic subarachnoid hemorrhage with loss of consciousness of 30 minutes or less, subsequent encounter: Secondary | ICD-10-CM | POA: Diagnosis not present

## 2018-11-29 DIAGNOSIS — S060X9D Concussion with loss of consciousness of unspecified duration, subsequent encounter: Secondary | ICD-10-CM | POA: Diagnosis not present

## 2018-11-30 ENCOUNTER — Other Ambulatory Visit: Payer: Self-pay

## 2018-11-30 ENCOUNTER — Encounter (INDEPENDENT_AMBULATORY_CARE_PROVIDER_SITE_OTHER): Payer: Medicare Other | Admitting: Ophthalmology

## 2018-11-30 DIAGNOSIS — I10 Essential (primary) hypertension: Secondary | ICD-10-CM

## 2018-11-30 DIAGNOSIS — H35033 Hypertensive retinopathy, bilateral: Secondary | ICD-10-CM

## 2018-11-30 DIAGNOSIS — H43813 Vitreous degeneration, bilateral: Secondary | ICD-10-CM

## 2018-11-30 DIAGNOSIS — E113292 Type 2 diabetes mellitus with mild nonproliferative diabetic retinopathy without macular edema, left eye: Secondary | ICD-10-CM | POA: Diagnosis not present

## 2018-11-30 DIAGNOSIS — H353132 Nonexudative age-related macular degeneration, bilateral, intermediate dry stage: Secondary | ICD-10-CM | POA: Diagnosis not present

## 2018-11-30 DIAGNOSIS — E11319 Type 2 diabetes mellitus with unspecified diabetic retinopathy without macular edema: Secondary | ICD-10-CM

## 2018-11-30 DIAGNOSIS — E113591 Type 2 diabetes mellitus with proliferative diabetic retinopathy without macular edema, right eye: Secondary | ICD-10-CM

## 2018-11-30 LAB — HM DIABETES EYE EXAM

## 2018-12-01 ENCOUNTER — Ambulatory Visit: Payer: Medicare Other | Attending: Hematology & Oncology | Admitting: Physical Therapy

## 2018-12-01 ENCOUNTER — Encounter: Payer: Self-pay | Admitting: Physical Therapy

## 2018-12-01 ENCOUNTER — Encounter (INDEPENDENT_AMBULATORY_CARE_PROVIDER_SITE_OTHER): Payer: Medicare Other | Admitting: Ophthalmology

## 2018-12-01 VITALS — BP 108/57 | HR 77

## 2018-12-01 DIAGNOSIS — R2681 Unsteadiness on feet: Secondary | ICD-10-CM | POA: Diagnosis not present

## 2018-12-01 DIAGNOSIS — R42 Dizziness and giddiness: Secondary | ICD-10-CM

## 2018-12-01 DIAGNOSIS — R262 Difficulty in walking, not elsewhere classified: Secondary | ICD-10-CM | POA: Insufficient documentation

## 2018-12-01 NOTE — Therapy (Signed)
[facility] [address]  [place], [place], [address] Phone: [phone]   Fax:  [phone]  Physical Therapy Evaluation  Patient Details  Name: Samantha Quinn MRN: [id number] Date of Birth: 1927/10/29 Referring Provider (PT): Burney Gauze, MD   Encounter Date: 12/01/2018  PT End of Session - 12/01/18 1756    Visit Number  1    Number of Visits  13    Date for PT Re-Evaluation  01/12/19    Authorization Type  Medicare & BCBS    PT Start Time  1320    PT Stop Time  1407    PT Time Calculation (min)  47 min    Activity Tolerance  Patient tolerated treatment well   limited by dizziness   Behavior During Therapy  [facility] for tasks assessed/performed       Past Medical History:  Diagnosis Date  . Anxiety   . Back pain    bulding disc  . Bronchitis    hx of-many,many years   . Diabetes mellitus   . Diabetes mellitus    type 2 and takes Metformin bid  . Dyslipidemia   . Embolism - blood clot    hx of in left lower leg and was on Coumadin for about 74months;this was about 8-56yrs ago  . Family history of adverse reaction to anesthesia    Daughter- Nausea  . Goals of care, counseling/discussion 11/12/2017  . History of colonic polyps   . History of radiation therapy 01/06/16-02/19/16   The primary tumor and involved mediastinal adenopathy were treated to 66 Gy in 33 fractions of 2 Gy.  [name] History of radiation therapy 04/01/17-04/26/17   cervical spine 35 Gy in 14 fractions  . HOH (hard of hearing)   . Hx of migraines    as a teenager  . Hypercholesterolemia   . Hyperlipidemia    takes Lovastatin every other day  . Hypertension    takes Ziac daily and Ramipril as well  . Hyperthyroidism   . Hypothyroidism    takes Synthroid every other day  . Insomnia    takes Elavil nightly  . Joint pain   . Lung mass   . Myalgia   . Osteoarthritis   . Osteoporosis   . Pneumothorax of right lung after biopsy 07/09/11    HAD CHEST TUBE PLACEMENT  . PONV (postoperative nausea and vomiting)   . Primary adenocarcinoma of lower lobe of right lung ([place]) 06/24/2011   pT2a, pN0 M0 Stage 1 Well differenced Adenocarcinoma 3.5 cm resected 07/17/2011  . Primary adenocarcinoma of lung ([place]) 06/24/2011  . Shortness of breath dyspnea    With exertion  . Skin cancer    legs have dark spots on them  . Upper respiratory infection 04/2011  . Urinary incontinence    takes Detrol daily prn    Past Surgical History:  Procedure Laterality Date  . ABDOMINAL HYSTERECTOMY    . ANTERIOR CERVICAL DECOMP/DISCECTOMY FUSION N/A 02/17/2017   Procedure: ANTERIOR CERVICAL DECOMPRESSION FUSION, CERVICAL 6-7 WITH INSTRUMENTATION AND ALLOGRAFT;  Surgeon: Phylliss Bob, MD;  Location: [facility];  Service: Orthopedics;  Laterality: N/A;  ANTERIOR CERVICAL DECOMPRESSION FUSION, CERVICAL 6-7 WITH INSTRUMENTATION AND ALLOGRAFT; REQUEST 2 HOURS AND FLIP   . APPENDECTOMY     as a child  . CHEST TUBE REMOVAL  07/13/11   RIGHT  CHEST TUBE  . CHOLECYSTECTOMY    . COLONOSCOPY    . COLONOSCOPY    .  DILATION AND CURETTAGE OF UTERUS     x 2  . EYE SURGERY Bilateral    Cataract  . HEMORRHOID SURGERY    . IR THORACENTESIS ASP PLEURAL SPACE W/IMG GUIDE  02/03/2018  . LUNG BIOPSY  07/09/11   RIGHT UPER LOBE LUNG MASS   . LUNG LOBECTOMY Right 06/2011  . POSTERIOR CERVICAL FUSION/FORAMINOTOMY N/A 02/18/2017   Procedure: CERVICAL Five-Six, CERVICAL Six-Seven, CERVICAL Seven-THORACIC One, THORACIC One-Two POSTERIOR SPINAL FUSION WITH INSTRUMENTATION AND ALLOGRAFT;  Surgeon: Phylliss Bob, MD;  Location: [facility];  Service: Orthopedics;  Laterality: N/A;  . RIGHT CHEST TUBE PLACEMENT  07/10/11   S/P LUNG BX  . ROTATOR CUFF REPAIR     bilateral  . TOE SURGERY  2013   right small toe  . TONSILLECTOMY     as a child  . VIDEO BRONCHOSCOPY  07/17/2011   Procedure: VIDEO BRONCHOSCOPY;  Surgeon: Grace Isaac, MD;  Location: [facility];  Service: Thoracic;   Laterality: N/A;  . VIDEO BRONCHOSCOPY WITH ENDOBRONCHIAL ULTRASOUND N/A 12/10/2015   Procedure: VIDEO BRONCHOSCOPY WITH ENDOBRONCHIAL ULTRASOUND with biopsies.;  Surgeon: Grace Isaac, MD;  Location: [facility];  Service: Thoracic;  Laterality: N/A;    Vitals:   12/01/18 1334  BP: (!) 108/57  Pulse: 77  SpO2: 94%     Subjective Assessment - 12/01/18 1322    Subjective  Patient reports that she had a fall 3 weeks ago and believes that it was caused by a bout of vertigo. Has a "dizzy head" and feels out of balance. Has been using a walker since her fall or holding onto her daughter in law for support.  Feels worse when she gets up out of bed, rolling in bed, bending forward to pick something off the ground. Denies recent HA, vomiting, or neck pain since the fall. Does endorse nausea.    Pertinent History  stage 4 lung CA with hx radiation, chemo, and R lobectomy, urinary incontinence, skin CA, SOB, osteoporosis, myalgia, hypothyroidism, HTN, HLD, migraines, HIH, hx of embolism, DM, back pain, R small toe surgery, B RTC repair, cervical fusion C5-6, C6-7, C7-T1, T1-2    Limitations  Sitting;Reading;Lifting;Standing;House hold activities;Walking    Patient Stated Goals  work on my balance    Currently in Pain?  No/denies         [facility] PT Assessment - 12/01/18 1330      Assessment   Medical Diagnosis  Primary adenocarcinoma of lower lobe of R lung    Referring Provider (PT)  Burney Gauze, MD    Onset Date/Surgical Date  11/09/18    Prior Therapy  yes      Precautions   Precautions  --    Precaution Comments  lunga CA, osteoporosis, falls      Balance Screen   Has the patient fallen in the past 6 months  Yes    How many times?  1    Has the patient had a decrease in activity level because of a fear of falling?   Yes    Is the patient reluctant to leave their home because of a fear of falling?   Yes      [place]  Private residence    Living  Arrangements  Alone    Available Help at Discharge  Family    Type of [place] entry    [place]  One level  Home Equipment  Walker - 2 wheels      Prior Function   Level of Independence  Independent with basic ADLs    Vocation  Retired    Leisure  reading, sewing      Posture/Postural Control   Posture/Postural Control  Postural limitations    Postural Limitations  Rounded Shoulders;Forward head;Increased thoracic kyphosis;Flexed trunk      Ambulation/Gait   Assistive device  None   clutching at PT for support   Gait Pattern  Step-to pattern;Step-through pattern;Decreased step length - right;Decreased step length - left;Trunk flexed;Narrow base of support;Poor foot clearance - right;Poor foot clearance - left    Ambulation Surface  Level;Indoor    Gait velocity  decreased           Vestibular Assessment - 12/01/18 0001      Oculomotor Exam   Oculomotor Alignment  Normal    Ocular ROM  intact    Spontaneous  Absent    Gaze-induced   Left beating nystagmus with L gaze;Right beating nystagmus with R gaze    Smooth Pursuits  Saccades   corrective saccades vertical and horizontal to B sides   Saccades  Dysmetria   corrective saccades R&L and up/down   Comment  convergence: WFL; 1/10 dizziness      Oculomotor Exam-Fixation Suppressed    Left Head Impulse  intact    Right Head Impulse  intact      Vestibulo-Ocular Reflex   VOR Cancellation  Corrective saccades   significant corrective saccades R & L and up/down   Comment  VOR horizontal: 2/10 dizziness and slow movement; vertical VOR: 0/10 dizziness and slow speed      Positional Testing   Sidelying Test  Sidelying Left;Sidelying Right      Sidelying Right   Sidelying Right Duration  7 sec    Sidelying Right Symptoms  Upbeat, right rotatory nystagmus   testing R canal     Sidelying Left   Sidelying Left Duration  6 sec    Sidelying Left Symptoms  Upbeat, left rotatory  nystagmus   testing L canal         Objective measurements completed on examination: See above findings.              PT Education - 12/01/18 1656    Education Details  prognosis, POC, HEP, edu on BPPV    Person(s) Educated  Patient;Other (comment)   daughter in law   Methods  Explanation;Demonstration;Handout;Verbal cues;Tactile cues    Comprehension  Verbalized understanding       PT Short Term Goals - 12/01/18 1815      PT SHORT TERM GOAL #1   Title  Patient to be independent with initial HEP.    Time  3    Period  Weeks    Status  New    Target Date  12/22/18        PT Long Term Goals - 12/01/18 1815      PT LONG TERM GOAL #1   Title  Patient to report 0/10 dizziness with R and L sidelying test.    Time  6    Period  Weeks    Status  New    Target Date  01/12/19      PT LONG TERM GOAL #2   Title  Patient to demonstrate good stability, step length, and upright posture when ambulating with LRAD.    Time  6    Period  Weeks  Status  New    Target Date  01/12/19      PT LONG TERM GOAL #3   Title  Patient to report no dizziness with bed mobility.    Time  6    Period  Weeks    Status  New    Target Date  01/12/19      PT LONG TERM GOAL #4   Title  Patient to score >/=45/56 on Berg to decrease risk of falls.    Time  6    Period  Weeks    Status  New    Target Date  01/12/19             Plan - 12/01/18 1757    Clinical Impression Statement  Patient is an 85y/o F presenting to OPPT with c/o dizziness and imbalance causing a fall on 11/09/18. Patient reports hx of "bouts of vertigo" for several years. Worse with getting up out of bed, rolling in bed, bending forward to pick something off the ground. Denies recent HA, vomiting, or neck pain since the fall. Does endorse nausea. Patient today ambulating into clinic without AD, requiring holding onto PT for support d/t not bringing in her walker. Oculomotor exam revealed direction changing  nystagmus with R and L gaze, corrective saccades with smooth pursuit, dysmetria with saccadic testing, saccades with VOR cancellation, and dizziness with horizontal VOR. Patient positive for dizziness and upbeat rotary nystagmus to R and L directions of sidelying test. Limited by time this session, thus educated patient and her daughter-in-law on BPPV. Plan to address vertigo with repositioning maneuver next session and assess LE strength to further decrease fall risk. Patient would benefit from skilled PT services 2x/week for 6 weeks as needed for dizziness.    Personal Factors and Comorbidities  Age;Behavior Pattern;Comorbidity 3+;Time since onset of injury/illness/exacerbation;Past/Current Experience    Comorbidities  stage 4 lung CA with hx radiation, chemo, and R lobectomy, urinary incontinence, skin CA, SOB, osteoporosis, myalgia, hypothyroidism, HTN, HLD, migraines, HIH, hx of embolism, DM, back pain, R small toe surgery, B RTC repair, cervical fusion C5-6, C6-7, C7-T1, T1-2,    Examination-Activity Limitations  Bathing;Sit;Bed Mobility;Sleep;Bend;Squat;Stairs;Carry;Stand;Toileting;Dressing;Transfers;Hygiene/Grooming;Lift;Locomotion Level;Reach Overhead    Examination-Participation Restrictions  Church;School;Cleaning;Shop;Community Activity;Interpersonal Relationship;Laundry;Meal Prep    Stability/Clinical Decision Making  Evolving/Moderate complexity    Clinical Decision Making  Moderate    Rehab Potential  Good    PT Frequency  2x / week    PT Duration  6 weeks    PT Treatment/Interventions  ADLs/Self Care Home Management;Canalith Repostioning;Cryotherapy;Moist Heat;Balance training;Therapeutic exercise;Therapeutic activities;Functional mobility training;Stair training;Gait training;Neuromuscular re-education;Patient/family education;Orthotic Fit/Training;Manual techniques;Vestibular;Taping;Energy conservation;Passive range of motion    PT Next Visit Plan  canalith repositioning    Consulted  and Agree with Plan of Care  Patient       Patient will benefit from skilled therapeutic intervention in order to improve the following deficits and impairments:  Abnormal gait, Decreased activity tolerance, Decreased strength, Decreased balance, Decreased mobility, Difficulty walking, Improper body mechanics, Decreased range of motion, Impaired flexibility, Postural dysfunction  Visit Diagnosis: 1. Dizziness and giddiness   2. Unsteadiness on feet   3. Difficulty in walking, not elsewhere classified        Problem List Patient Active Problem List   Diagnosis Date Noted  . Dizziness 11/12/2018  . Subdural hematoma ([place]) 11/12/2018  . Nasal bleeding 11/12/2018  . Intracranial bleed ([place]) 11/09/2018  . Vitamin B 12 deficiency 03/10/2018  . Goals of care, counseling/discussion 11/12/2017  . IDA (iron  deficiency anemia) 03/12/2017  . Radiculopathy 02/17/2017  . Lung cancer metastatic to bone ([place]) 02/05/2017  . Thyrotoxicosis without thyroid storm 01/18/2017  . Bilateral hearing loss 08/27/2016  . Knee injury 08/27/2016  . Toxic multinodular goiter w/o crisis 06/22/2016  . Fatigue 05/05/2016  . Macular degeneration 03/09/2016  . Encounter for antineoplastic chemotherapy 12/19/2015  . Meralgia paresthetica of left side 07/10/2015  . Osteoporosis 07/10/2015  . Onychomycosis 07/10/2015  . Diabetes ([place]) 07/10/2015  . Cough 12/12/2013  . Pneumothorax after biopsy 07/09/2011    Class: Acute  . Primary adenocarcinoma of lower lobe of right lung ([place]) 06/24/2011  . Hypercholesterolemia 10/20/2010  . Essential hypertension, benign 10/20/2010  . Osteoarthritis 10/20/2010     Janene Harvey, PT, DPT 12/01/18 6:20 PM   [facility] [address]  [place], [place], [address] Phone: [phone]   Fax:  [phone]  Name: Samantha Quinn MRN: [id number] Date of Birth: [birth year]

## 2018-12-05 ENCOUNTER — Ambulatory Visit: Payer: Medicare Other | Admitting: Physical Therapy

## 2018-12-05 ENCOUNTER — Encounter: Payer: Self-pay | Admitting: Physical Therapy

## 2018-12-05 ENCOUNTER — Other Ambulatory Visit: Payer: Self-pay

## 2018-12-05 VITALS — BP 125/60 | HR 84

## 2018-12-05 DIAGNOSIS — R42 Dizziness and giddiness: Secondary | ICD-10-CM | POA: Diagnosis not present

## 2018-12-05 DIAGNOSIS — R2681 Unsteadiness on feet: Secondary | ICD-10-CM

## 2018-12-05 DIAGNOSIS — R262 Difficulty in walking, not elsewhere classified: Secondary | ICD-10-CM

## 2018-12-05 NOTE — Therapy (Signed)
[facility] [address]  [place], [place], [address] Phone: [phone]   Fax:  [phone]  Physical Therapy Treatment  Patient Details  Name: Samantha Quinn MRN: [id number] Date of Birth: Jan 20, 1928 Referring Provider (PT): Burney Gauze, MD   Encounter Date: 12/05/2018  PT End of Session - 12/05/18 1757    Visit Number  2    Number of Visits  13    Date for PT Re-Evaluation  01/12/19    Authorization Type  Medicare & BCBS    PT Start Time  1616    PT Stop Time  1702    PT Time Calculation (min)  46 min    Activity Tolerance  Patient tolerated treatment well   limited by dizziness   Behavior During Therapy  [facility] for tasks assessed/performed       Past Medical History:  Diagnosis Date  . Anxiety   . Back pain    bulding disc  . Bronchitis    hx of-many,many years   . Diabetes mellitus   . Diabetes mellitus    type 2 and takes Metformin bid  . Dyslipidemia   . Embolism - blood clot    hx of in left lower leg and was on Coumadin for about 46months;this was about 8-34yrs ago  . Family history of adverse reaction to anesthesia    Daughter- Nausea  . Goals of care, counseling/discussion 11/12/2017  . History of colonic polyps   . History of radiation therapy 01/06/16-02/19/16   The primary tumor and involved mediastinal adenopathy were treated to 66 Gy in 33 fractions of 2 Gy.  [name] History of radiation therapy 04/01/17-04/26/17   cervical spine 35 Gy in 14 fractions  . HOH (hard of hearing)   . Hx of migraines    as a teenager  . Hypercholesterolemia   . Hyperlipidemia    takes Lovastatin every other day  . Hypertension    takes Ziac daily and Ramipril as well  . Hyperthyroidism   . Hypothyroidism    takes Synthroid every other day  . Insomnia    takes Elavil nightly  . Joint pain   . Lung mass   . Myalgia   . Osteoarthritis   . Osteoporosis   . Pneumothorax of right lung after biopsy 07/09/11   HAD CHEST TUBE PLACEMENT  . PONV (postoperative nausea and vomiting)   . Primary adenocarcinoma of lower lobe of right lung ([place]) 06/24/2011   pT2a, pN0 M0 Stage 1 Well differenced Adenocarcinoma 3.5 cm resected 07/17/2011  . Primary adenocarcinoma of lung ([place]) 06/24/2011  . Shortness of breath dyspnea    With exertion  . Skin cancer    legs have dark spots on them  . Upper respiratory infection 04/2011  . Urinary incontinence    takes Detrol daily prn    Past Surgical History:  Procedure Laterality Date  . ABDOMINAL HYSTERECTOMY    . ANTERIOR CERVICAL DECOMP/DISCECTOMY FUSION N/A 02/17/2017   Procedure: ANTERIOR CERVICAL DECOMPRESSION FUSION, CERVICAL 6-7 WITH INSTRUMENTATION AND ALLOGRAFT;  Surgeon: Phylliss Bob, MD;  Location: [facility];  Service: Orthopedics;  Laterality: N/A;  ANTERIOR CERVICAL DECOMPRESSION FUSION, CERVICAL 6-7 WITH INSTRUMENTATION AND ALLOGRAFT; REQUEST 2 HOURS AND FLIP   . APPENDECTOMY     as a child  . CHEST TUBE REMOVAL  07/13/11   RIGHT  CHEST TUBE  . CHOLECYSTECTOMY    . COLONOSCOPY    . COLONOSCOPY    . DILATION  AND CURETTAGE OF UTERUS     x 2  . EYE SURGERY Bilateral    Cataract  . HEMORRHOID SURGERY    . IR THORACENTESIS ASP PLEURAL SPACE W/IMG GUIDE  02/03/2018  . LUNG BIOPSY  07/09/11   RIGHT UPER LOBE LUNG MASS   . LUNG LOBECTOMY Right 06/2011  . POSTERIOR CERVICAL FUSION/FORAMINOTOMY N/A 02/18/2017   Procedure: CERVICAL Five-Six, CERVICAL Six-Seven, CERVICAL Seven-THORACIC One, THORACIC One-Two POSTERIOR SPINAL FUSION WITH INSTRUMENTATION AND ALLOGRAFT;  Surgeon: Phylliss Bob, MD;  Location: [facility];  Service: Orthopedics;  Laterality: N/A;  . RIGHT CHEST TUBE PLACEMENT  07/10/11   S/P LUNG BX  . ROTATOR CUFF REPAIR     bilateral  . TOE SURGERY  2013   right small toe  . TONSILLECTOMY     as a child  . VIDEO BRONCHOSCOPY  07/17/2011   Procedure: VIDEO BRONCHOSCOPY;  Surgeon: Grace Isaac, MD;  Location: [facility];  Service: Thoracic;   Laterality: N/A;  . VIDEO BRONCHOSCOPY WITH ENDOBRONCHIAL ULTRASOUND N/A 12/10/2015   Procedure: VIDEO BRONCHOSCOPY WITH ENDOBRONCHIAL ULTRASOUND with biopsies.;  Surgeon: Grace Isaac, MD;  Location: [facility];  Service: Thoracic;  Laterality: N/A;    Vitals:   12/05/18 1623 12/05/18 1624  BP:  125/60  Pulse: 87 84  SpO2: (!) 87% 95%    Subjective Assessment - 12/05/18 1627    Subjective  Patient present with daughter. Reports that she has been feeling better. Has not been feeling quite as woozy when getting up. 0/10 dizziness at baseline.    Pertinent History  stage 4 lung CA with hx radiation, chemo, and R lobectomy, urinary incontinence, skin CA, SOB, osteoporosis, myalgia, hypothyroidism, HTN, HLD, migraines, HIH, hx of embolism, DM, back pain, R small toe surgery, B RTC repair, cervical fusion C5-6, C6-7, C7-T1, T1-2    Limitations  Sitting;Reading;Lifting;Standing;House hold activities;Walking    Patient Stated Goals  work on my balance             Vestibular Assessment - 12/05/18 0001      Positional Testing   Sidelying Test  Sidelying Right      Sidelying Right   Sidelying Right Duration  8, 6, 4    Sidelying Right Symptoms  Upbeat, right rotatory nystagmus   tested 3 times with each subsequent test becoming shorter               Vestibular Treatment/Exercise - 12/05/18 0001      Vestibular Treatment/Exercise   Vestibular Treatment Provided  Canalith Repositioning    Canalith Repositioning  Semont Procedure Right Posterior      Semont Procedure Right Posterior   Number of Reps   3    Overall Response   Improved Symptoms    Response Details   tolerated maneuver well without any post-maneuver nausea, vomitting, or increase in dizziness            PT Education - 12/05/18 1753    Education Details  education on Semont maneuver and post-maneuver expectations and precautions    Person(s) Educated  Patient;Child(ren)   daughter   Methods   Explanation;Demonstration;Tactile cues;Verbal cues;Handout    Comprehension  Verbalized understanding;Returned demonstration       PT Short Term Goals - 12/05/18 1802      PT SHORT TERM GOAL #1   Title  Patient to be independent with initial HEP.    Time  3    Period  Weeks    Status  On-going  Target Date  12/22/18        PT Long Term Goals - 12/05/18 1802      PT LONG TERM GOAL #1   Title  Patient to report 0/10 dizziness with R and L sidelying test.    Time  6    Period  Weeks    Status  On-going      PT LONG TERM GOAL #2   Title  Patient to demonstrate good stability, step length, and upright posture when ambulating with LRAD.    Time  6    Period  Weeks    Status  On-going      PT LONG TERM GOAL #3   Title  Patient to report no dizziness with bed mobility.    Time  6    Period  Weeks    Status  On-going      PT LONG TERM GOAL #4   Title  Patient to score >/=45/56 on Berg to decrease risk of falls.    Time  6    Period  Weeks    Status  On-going            Plan - 12/05/18 1757    Clinical Impression Statement  Patient present with daughter. Reporting slight improvement in dizziness since last session and with mild improvement in confidence with walking. Still requiring PT assistance to ambulate into treatment room. Spent time educating patient and daughter on Semont maneuver expectations, as well as on BPPV in general. Tested R sidelying test with patient presenting with up-beating torsional nystagmus to the R. Performed Semont maneuver, which patient tolerated this well. Allowed patient to sit and recover before re-testing. Performed testing and treatment 2 more times with patient's nystagmus lasting progressively shorter duration. Patient also reporting improvement in dizziness upon getting up to walk. Educated patient and daughter on post-maneuver instructions. Patient and daughter reported understanding and with no complaints at end of session.     Comorbidities  stage 4 lung CA with hx radiation, chemo, and R lobectomy, urinary incontinence, skin CA, SOB, osteoporosis, myalgia, hypothyroidism, HTN, HLD, migraines, HIH, hx of embolism, DM, back pain, R small toe surgery, B RTC repair, cervical fusion C5-6, C6-7, C7-T1, T1-2,    PT Treatment/Interventions  ADLs/Self Care Home Management;Canalith Repostioning;Cryotherapy;Moist Heat;Balance training;Therapeutic exercise;Therapeutic activities;Functional mobility training;Stair training;Gait training;Neuromuscular re-education;Patient/family education;Orthotic Fit/Training;Manual techniques;Vestibular;Taping;Energy conservation;Passive range of motion    PT Next Visit Plan  canalith repositioning    Consulted and Agree with Plan of Care  Patient;Family member/caregiver    Family Member Consulted  daughter       Patient will benefit from skilled therapeutic intervention in order to improve the following deficits and impairments:  Abnormal gait, Decreased activity tolerance, Decreased strength, Decreased balance, Decreased mobility, Difficulty walking, Improper body mechanics, Decreased range of motion, Impaired flexibility, Postural dysfunction  Visit Diagnosis: 1. Dizziness and giddiness   2. Unsteadiness on feet   3. Difficulty in walking, not elsewhere classified        Problem List Patient Active Problem List   Diagnosis Date Noted  . Dizziness 11/12/2018  . Subdural hematoma ([place]) 11/12/2018  . Nasal bleeding 11/12/2018  . Intracranial bleed ([place]) 11/09/2018  . Vitamin B 12 deficiency 03/10/2018  . Goals of care, counseling/discussion 11/12/2017  . IDA (iron deficiency anemia) 03/12/2017  . Radiculopathy 02/17/2017  . Lung cancer metastatic to bone ([place]) 02/05/2017  . Thyrotoxicosis without thyroid storm 01/18/2017  . Bilateral hearing loss 08/27/2016  . Knee injury 08/27/2016  .  Toxic multinodular goiter w/o crisis 06/22/2016  . Fatigue 05/05/2016  . Macular degeneration  03/09/2016  . Encounter for antineoplastic chemotherapy 12/19/2015  . Meralgia paresthetica of left side 07/10/2015  . Osteoporosis 07/10/2015  . Onychomycosis 07/10/2015  . Diabetes ([place]) 07/10/2015  . Cough 12/12/2013  . Pneumothorax after biopsy 07/09/2011    Class: Acute  . Primary adenocarcinoma of lower lobe of right lung ([place]) 06/24/2011  . Hypercholesterolemia 10/20/2010  . Essential hypertension, benign 10/20/2010  . Osteoarthritis 10/20/2010    Janene Harvey, PT, DPT 12/05/18 6:04 PM   [facility] [address]  [address] [place], [place], [address] Phone: [phone]   Fax:  [phone]  Name: ANNALIZ AVEN MRN: [id number] Date of Birth: [birth year]

## 2018-12-07 ENCOUNTER — Encounter (INDEPENDENT_AMBULATORY_CARE_PROVIDER_SITE_OTHER): Payer: Medicare Other | Admitting: Ophthalmology

## 2018-12-08 ENCOUNTER — Other Ambulatory Visit: Payer: Self-pay | Admitting: Internal Medicine

## 2018-12-09 IMAGING — CT CT ANGIO CHEST
2 of 8 series · 18 of 36 positions shown · IV contrast (iopamidol)
Comparison: PET-CT, 11/10/2017.  Chest CT, 01/20/2017.

CLINICAL DATA: Patient c/o right sided chest discomfort with SOB x
few days, hx of right sided lung cancer metastasized to the bones,
non-smoker, hx of HTN, hyperlipidemia, diabetes, no other complaints

EXAM:
CT ANGIOGRAPHY CHEST WITH CONTRAST
TECHNIQUE: Multidetector CT imaging of the chest was performed using the
standard protocol during bolus administration of intravenous
contrast. Multiplanar CT image reconstructions and MIPs were
obtained to evaluate the vascular anatomy.
CONTRAST:  100mL [id number] IOPAMIDOL (J9B5TL-M3G) INJECTION 76%

[Series 6: pe coronal mpr · coronal · 0.48mm/px · 1 of 125 slices shown]
[im 63/125  mediastinal]
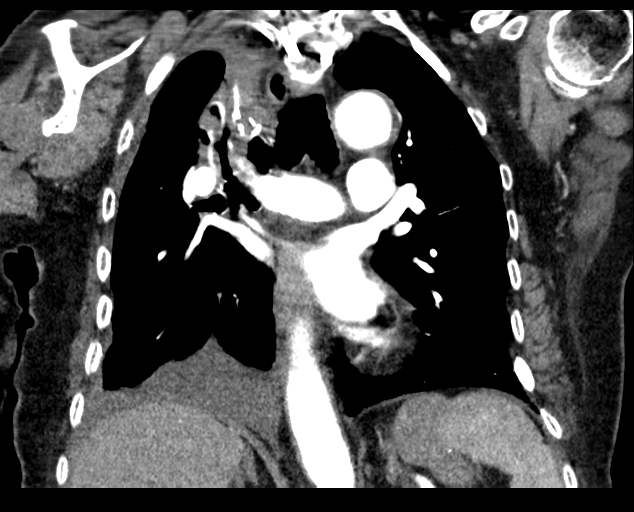

[Series 10: pe thins · axial · 0.58mm/px · z∈[-246,-42]mm · 17 of 228 slices shown]
[im 12/228  lung]
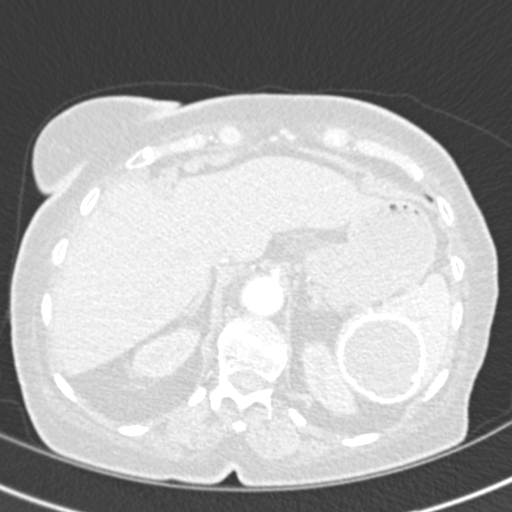
[im 24/228  mediastinal]
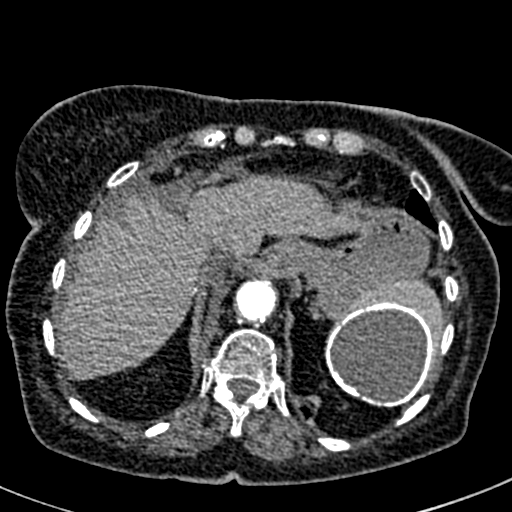
[im 36/228  lung]
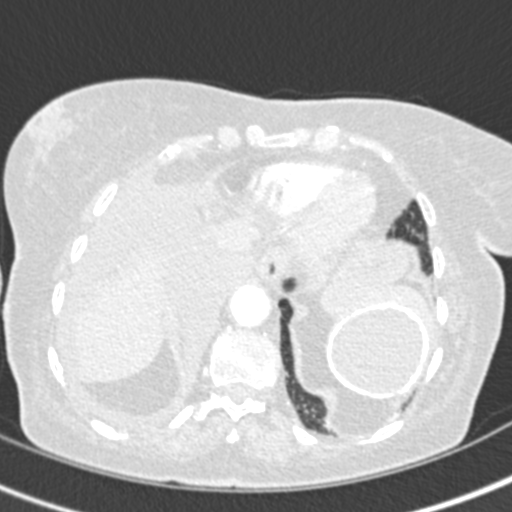
[im 48/228  mediastinal]
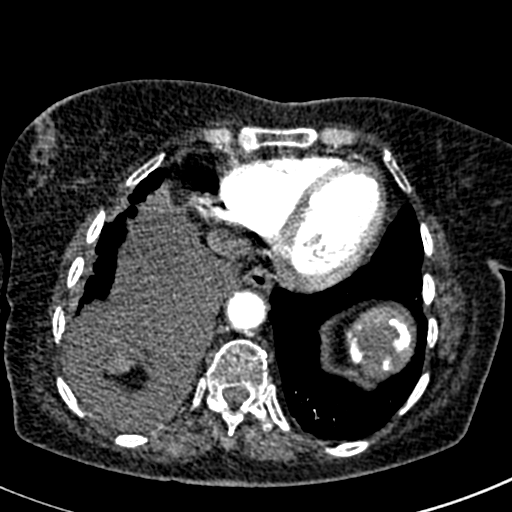
[im 60/228  lung]
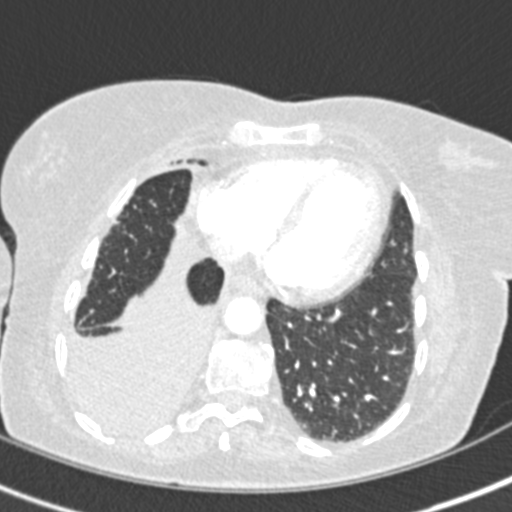
[im 72/228  mediastinal]
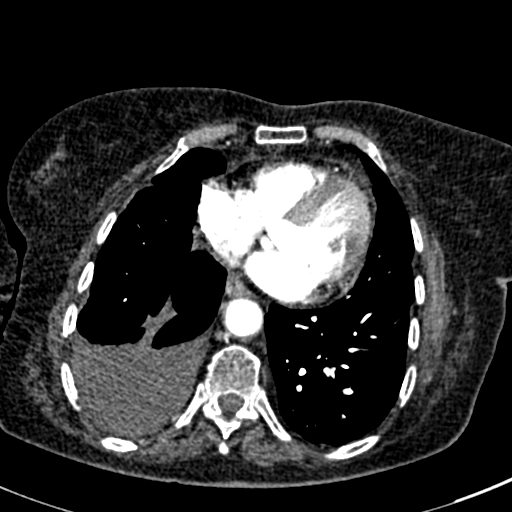
[im 84/228  lung]
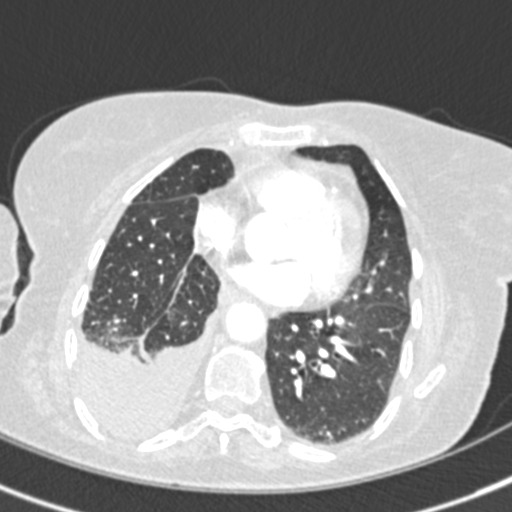
[im 96/228  mediastinal]
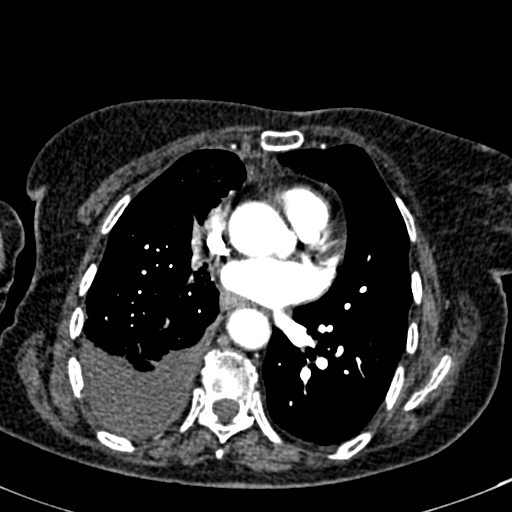
[im 120/228  lung]
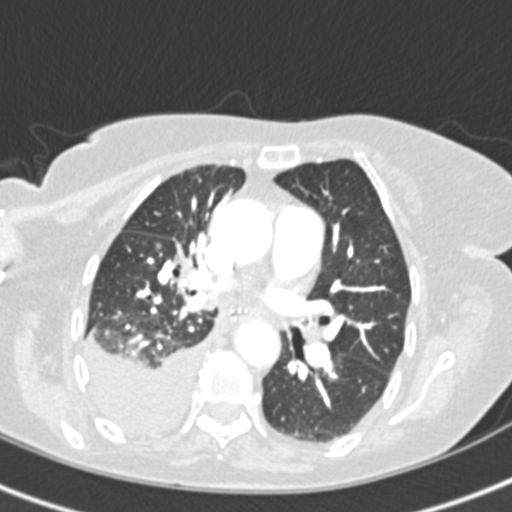
[im 132/228  mediastinal]
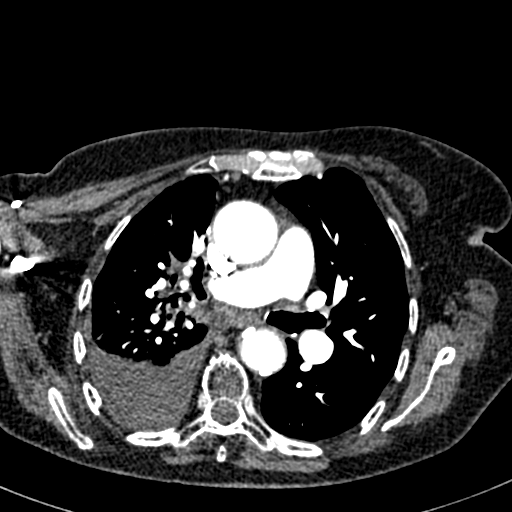
[im 144/228  lung]
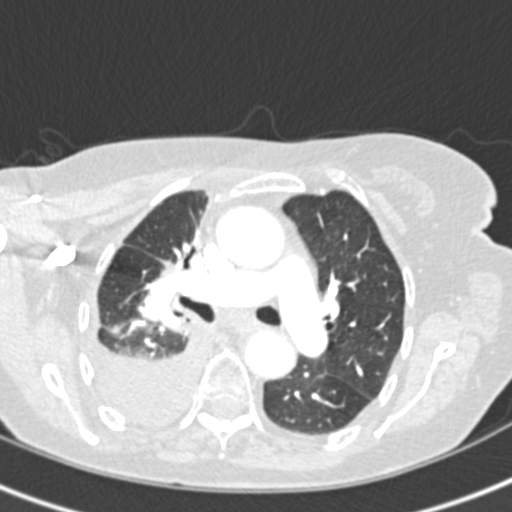
[im 156/228  mediastinal]
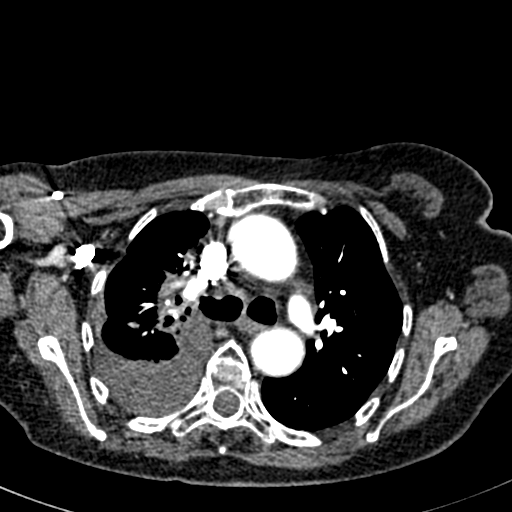
[im 168/228  lung]
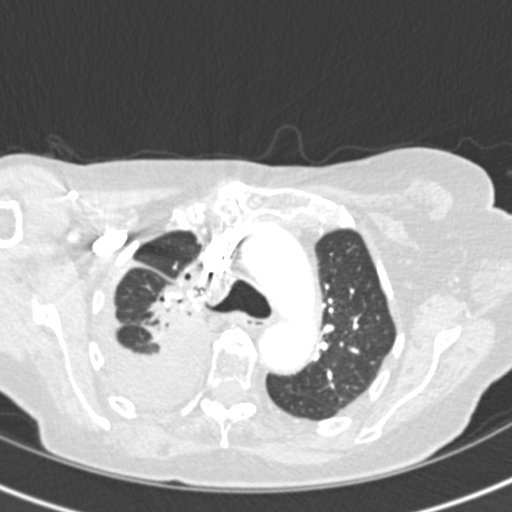
[im 180/228  mediastinal]
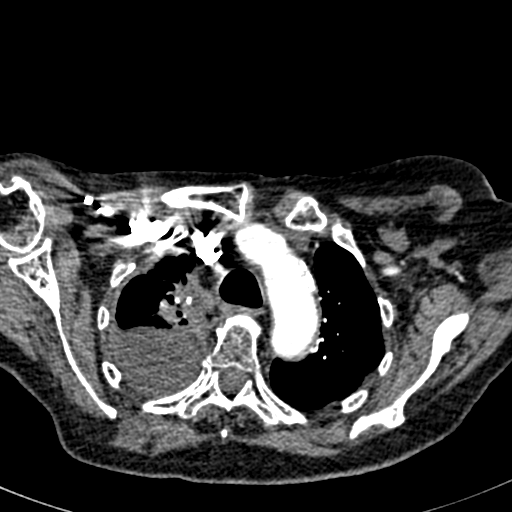
[im 192/228  lung]
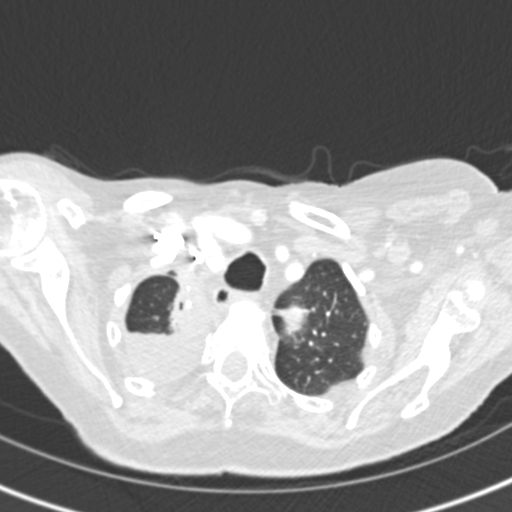
[im 204/228  mediastinal]
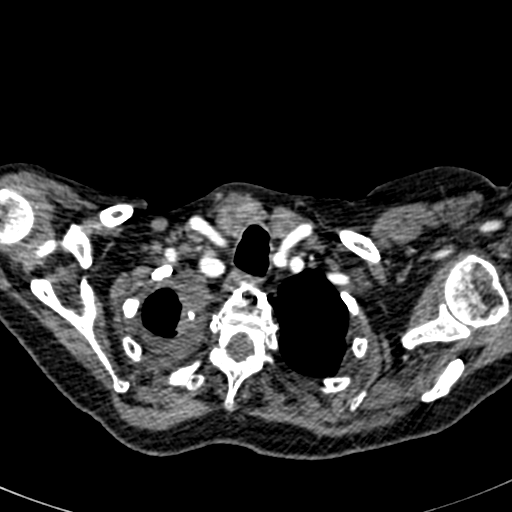
[im 216/228  lung]
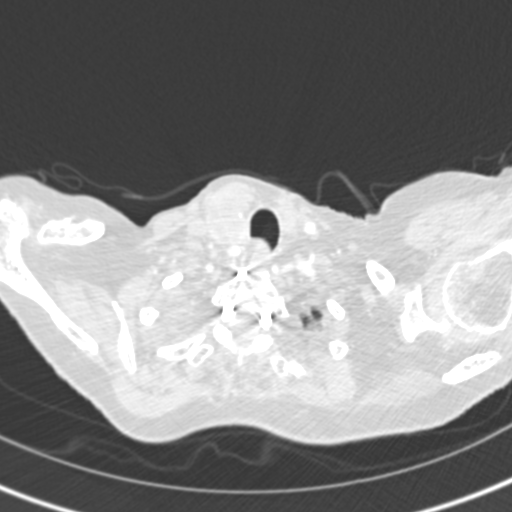

[18 of 36 positions shown; findings below may reference images not displayed]

FINDINGS: Cardiovascular: There is satisfactory opacification of the pulmonary
arteries to the segmental level. There is no evidence of a pulmonary
embolus.

Heart is normal in size and configuration. No pericardial effusion.
Mild aortic atherosclerosis. No dissection.

Mediastinum/Nodes: Enlarged right thyroid consistent with a poorly
defined nodule or nodules, stable from the prior PET-CT and chest
CT. No mediastinal or hilar masses or discrete enlarged lymph nodes.
Trachea is widely patent. Esophagus is unremarkable.

Lungs/Pleura: Soft tissue surrounds the right hilar structures
extends along the medial aspect of the right upper lobe, associated
with surgical anastomosis staples, stable from the prior PET-CT and
chest CT. This is consistent with radiation induced scarring.
Moderate right pleural effusion is also stable from the prior
PET-CT. 3 mm nodule, left upper lobe, image 30, series [DATE] mm
nodule, left upper lobe, image 18. Other tiny nodular opacities are
noted, all stable from the prior PET-CT. Mild dependent opacity
noted in the right lower lobe consistent with atelectasis.

No left pleural effusion.  No pneumothorax.

Upper Abdomen: Periphery calcified splenic cyst, stable. No acute
findings.

Musculoskeletal: Sclerotic metastatic lesions in T3 and the sternum,
similar to the prior PET-CT, but new from the chest CT dated
01/20/2017. No osteolytic lesions.

Review of the MIP images confirms the above findings.
IMPRESSION: 1. No evidence of a pulmonary embolism.
2. No acute findings.
3. Postradiation changes surrounding the right hilar structures and
extending along the medial right upper lobe are stable.
4. Moderate right pleural effusion unchanged from the prior PET-CT.
5. There are scattered small nodules that are also unchanged from
the prior PET-CT.
6. Sclerotic metastatic lesions in the sternum and T3 vertebra.
7. Aortic atherosclerosis.

Aortic Atherosclerosis ([id number]).

## 2018-12-13 ENCOUNTER — Encounter: Payer: Self-pay | Admitting: Internal Medicine

## 2018-12-13 NOTE — Progress Notes (Signed)
Abstracted and sent to scan  

## 2018-12-19 ENCOUNTER — Encounter (HOSPITAL_COMMUNITY)
Admission: RE | Admit: 2018-12-19 | Discharge: 2018-12-19 | Disposition: A | Discharge status: Home / Self Care | Payer: Medicare Other | Source: Ambulatory Visit | Attending: Hematology & Oncology | Admitting: Hematology & Oncology

## 2018-12-19 ENCOUNTER — Other Ambulatory Visit: Payer: Self-pay

## 2018-12-19 DIAGNOSIS — D734 Cyst of spleen: Secondary | ICD-10-CM | POA: Insufficient documentation

## 2018-12-19 DIAGNOSIS — R918 Other nonspecific abnormal finding of lung field: Secondary | ICD-10-CM | POA: Diagnosis not present

## 2018-12-19 DIAGNOSIS — I77810 Thoracic aortic ectasia: Secondary | ICD-10-CM | POA: Insufficient documentation

## 2018-12-19 DIAGNOSIS — C3490 Malignant neoplasm of unspecified part of unspecified bronchus or lung: Secondary | ICD-10-CM | POA: Diagnosis not present

## 2018-12-19 DIAGNOSIS — C7951 Secondary malignant neoplasm of bone: Secondary | ICD-10-CM | POA: Diagnosis not present

## 2018-12-19 DIAGNOSIS — C3431 Malignant neoplasm of lower lobe, right bronchus or lung: Secondary | ICD-10-CM | POA: Insufficient documentation

## 2018-12-19 DIAGNOSIS — E041 Nontoxic single thyroid nodule: Secondary | ICD-10-CM | POA: Insufficient documentation

## 2018-12-19 DIAGNOSIS — I770 Arteriovenous fistula, acquired: Secondary | ICD-10-CM | POA: Diagnosis not present

## 2018-12-19 DIAGNOSIS — J90 Pleural effusion, not elsewhere classified: Secondary | ICD-10-CM | POA: Insufficient documentation

## 2018-12-19 LAB — GLUCOSE, CAPILLARY: Glucose-Capillary: 133 mg/dL — ABNORMAL HIGH (ref 70–99)

## 2018-12-19 MED ORDER — FLUDEOXYGLUCOSE F - 18 (FDG) INJECTION
5.5200 | Freq: Once | INTRAVENOUS | Status: AC | PRN
  Administered 2018-12-19: 10:00:00 5.52 via INTRAVENOUS

## 2018-12-20 ENCOUNTER — Ambulatory Visit: Payer: Medicare Other | Admitting: Physical Therapy

## 2018-12-20 ENCOUNTER — Encounter: Payer: Self-pay | Admitting: Physical Therapy

## 2018-12-20 DIAGNOSIS — R2681 Unsteadiness on feet: Secondary | ICD-10-CM | POA: Diagnosis not present

## 2018-12-20 DIAGNOSIS — R42 Dizziness and giddiness: Secondary | ICD-10-CM

## 2018-12-20 DIAGNOSIS — R262 Difficulty in walking, not elsewhere classified: Secondary | ICD-10-CM | POA: Diagnosis not present

## 2018-12-20 NOTE — Patient Instructions (Addendum)
We checked your a1c here today.   Medications reviewed and updated.  Changes include :   Decrease metformin to once daily    Please followup in 6 months

## 2018-12-20 NOTE — Therapy (Signed)
[facility] [address]  [place], [place], [address] Phone: [phone]   Fax:  [phone]  Physical Therapy Treatment  Patient Details  Name: Samantha Quinn MRN: [id number] Date of Birth: 1927/10/07 Referring Provider (PT): Burney Gauze, MD   Encounter Date: 12/20/2018  PT End of Session - 12/20/18 1811    Visit Number  3    Number of Visits  13    Date for PT Re-Evaluation  01/12/19    Authorization Type  Medicare & BCBS    PT Start Time  2836    PT Stop Time  1658    PT Time Calculation (min)  49 min    Activity Tolerance  Patient tolerated treatment well;Patient limited by fatigue   limited by dizziness   Behavior During Therapy  [facility] for tasks assessed/performed       Past Medical History:  Diagnosis Date  . Anxiety   . Back pain    bulding disc  . Bronchitis    hx of-many,many years   . Diabetes mellitus   . Diabetes mellitus    type 2 and takes Metformin bid  . Dyslipidemia   . Embolism - blood clot    hx of in left lower leg and was on Coumadin for about 81months;this was about 8-31yrs ago  . Family history of adverse reaction to anesthesia    Daughter- Nausea  . Goals of care, counseling/discussion 11/12/2017  . History of colonic polyps   . History of radiation therapy 01/06/16-02/19/16   The primary tumor and involved mediastinal adenopathy were treated to 66 Gy in 33 fractions of 2 Gy.  [name] History of radiation therapy 04/01/17-04/26/17   cervical spine 35 Gy in 14 fractions  . HOH (hard of hearing)   . Hx of migraines    as a teenager  . Hypercholesterolemia   . Hyperlipidemia    takes Lovastatin every other day  . Hypertension    takes Ziac daily and Ramipril as well  . Hyperthyroidism   . Hypothyroidism    takes Synthroid every other day  . Insomnia    takes Elavil nightly  . Joint pain   . Lung mass   . Myalgia   . Osteoarthritis   . Osteoporosis   . Pneumothorax of right lung  after biopsy 07/09/11   HAD CHEST TUBE PLACEMENT  . PONV (postoperative nausea and vomiting)   . Primary adenocarcinoma of lower lobe of right lung ([place]) 06/24/2011   pT2a, pN0 M0 Stage 1 Well differenced Adenocarcinoma 3.5 cm resected 07/17/2011  . Primary adenocarcinoma of lung ([place]) 06/24/2011  . Shortness of breath dyspnea    With exertion  . Skin cancer    legs have dark spots on them  . Upper respiratory infection 04/2011  . Urinary incontinence    takes Detrol daily prn    Past Surgical History:  Procedure Laterality Date  . ABDOMINAL HYSTERECTOMY    . ANTERIOR CERVICAL DECOMP/DISCECTOMY FUSION N/A 02/17/2017   Procedure: ANTERIOR CERVICAL DECOMPRESSION FUSION, CERVICAL 6-7 WITH INSTRUMENTATION AND ALLOGRAFT;  Surgeon: Phylliss Bob, MD;  Location: [facility];  Service: Orthopedics;  Laterality: N/A;  ANTERIOR CERVICAL DECOMPRESSION FUSION, CERVICAL 6-7 WITH INSTRUMENTATION AND ALLOGRAFT; REQUEST 2 HOURS AND FLIP   . APPENDECTOMY     as a child  . CHEST TUBE REMOVAL  07/13/11   RIGHT  CHEST TUBE  . CHOLECYSTECTOMY    . COLONOSCOPY    . COLONOSCOPY    .  DILATION AND CURETTAGE OF UTERUS     x 2  . EYE SURGERY Bilateral    Cataract  . HEMORRHOID SURGERY    . IR THORACENTESIS ASP PLEURAL SPACE W/IMG GUIDE  02/03/2018  . LUNG BIOPSY  07/09/11   RIGHT UPER LOBE LUNG MASS   . LUNG LOBECTOMY Right 06/2011  . POSTERIOR CERVICAL FUSION/FORAMINOTOMY N/A 02/18/2017   Procedure: CERVICAL Five-Six, CERVICAL Six-Seven, CERVICAL Seven-THORACIC One, THORACIC One-Two POSTERIOR SPINAL FUSION WITH INSTRUMENTATION AND ALLOGRAFT;  Surgeon: Phylliss Bob, MD;  Location: [facility];  Service: Orthopedics;  Laterality: N/A;  . RIGHT CHEST TUBE PLACEMENT  07/10/11   S/P LUNG BX  . ROTATOR CUFF REPAIR     bilateral  . TOE SURGERY  2013   right small toe  . TONSILLECTOMY     as a child  . VIDEO BRONCHOSCOPY  07/17/2011   Procedure: VIDEO BRONCHOSCOPY;  Surgeon: Grace Isaac, MD;  Location: [facility];   Service: Thoracic;  Laterality: N/A;  . VIDEO BRONCHOSCOPY WITH ENDOBRONCHIAL ULTRASOUND N/A 12/10/2015   Procedure: VIDEO BRONCHOSCOPY WITH ENDOBRONCHIAL ULTRASOUND with biopsies.;  Surgeon: Grace Isaac, MD;  Location: [facility] OR;  Service: Thoracic;  Laterality: N/A;    There were no vitals filed for this visit.  Subjective Assessment - 12/20/18 1616    Subjective  Present with daughter. Reports that dizziness has progressively gotten better and now today feeling considerably better. Still using walker intermittently at home. Not having much dizziness getting in/out of bed recently, but has avoided sleeping normally. 0/10 dizziness at baseline.Intermittently having surging pain in R ear/temple since last session; this is also better today.    Pertinent History  stage 4 lung CA with hx radiation, chemo, and R lobectomy, urinary incontinence, skin CA, SOB, osteoporosis, myalgia, hypothyroidism, HTN, HLD, migraines, HIH, hx of embolism, DM, back pain, R small toe surgery, B RTC repair, cervical fusion C5-6, C6-7, C7-T1, T1-2    Limitations  Sitting;Reading;Lifting;Standing;House hold activities;Walking    Patient Stated Goals  work on my balance    Currently in Pain?  No/denies         [facility] PT Assessment - 12/20/18 0001      ROM / Strength   AROM / PROM / Strength  Strength      Strength   Strength Assessment Site  Hip;Knee;Ankle    Right/Left Hip  Right;Left    Right Hip Flexion  4/5    Right Hip ABduction  4+/5    Right Hip ADduction  4/5    Left Hip Flexion  4+/5    Left Hip ABduction  4+/5    Left Hip ADduction  4/5    Right/Left Knee  Right;Left    Right Knee Flexion  4/5    Right Knee Extension  4+/5    Left Knee Flexion  4/5    Left Knee Extension  4+/5    Right/Left Ankle  Right;Left    Right Ankle Dorsiflexion  4+/5    Right Ankle Plantar Flexion  4/5    Left Ankle Dorsiflexion  4/5    Left Ankle Plantar Flexion  4/5      Standardized Balance Assessment    Standardized Balance Assessment  Berg Balance Test      Berg Balance Test   Sit to Stand  Able to stand without using hands and stabilize independently    Standing Unsupported  Able to stand safely 2 minutes    Sitting with Back Unsupported but Feet Supported on Floor  or Stool  Able to sit safely and securely 2 minutes    Stand to Sit  Sits safely with minimal use of hands    Transfers  Able to transfer safely, minor use of hands    Standing Unsupported with Eyes Closed  Able to stand 10 seconds safely    Standing Unsupported with Feet Together  Able to place feet together independently and stand 1 minute safely    From Standing, Reach Forward with Outstretched Arm  Can reach forward >12 cm safely (5")    From Standing Position, Pick up Object from Floor  Able to pick up shoe, needs supervision    From Standing Position, Turn to Look Behind Over each Shoulder  Turn sideways only but maintains balance    Turn 360 Degrees  Able to turn 360 degrees safely but slowly    Standing Unsupported, Alternately Place Feet on Step/Stool  Able to complete 4 steps without aid or supervision    Standing Unsupported, One Foot in Front  Able to plae foot ahead of the other independently and hold 30 seconds    Standing on One Leg  Tries to lift leg/unable to hold 3 seconds but remains standing independently    Total Score  44                   [facility] Adult PT Treatment/Exercise - 12/20/18 0001      Exercises   Exercises  Knee/Hip      Knee/Hip Exercises: Standing   Heel Raises  Both;1 set;10 reps    Heel Raises Limitations  at counter    Hip Flexion  Stengthening;1 set;10 reps;Knee bent    Hip Flexion Limitations  resisted marching with yellow TB at counter top      Knee/Hip Exercises: Seated   Sit to Sand  with UE support;without UE support   3 reps; 3rd rep with 1 UE support            PT Education - 12/20/18 1811    Education Details  update to HEP; instructed to resume normal  sleeping set up in order to assess dizziness; discussion with patient and daughter about best course of treatment for dizziness    Person(s) Educated  Patient;Child(ren)   daughter   Methods  Explanation;Demonstration;Tactile cues;Handout;Verbal cues    Comprehension  Verbalized understanding;Returned demonstration       PT Short Term Goals - 12/05/18 1802      PT SHORT TERM GOAL #1   Title  Patient to be independent with initial HEP.    Time  3    Period  Weeks    Status  On-going    Target Date  12/22/18        PT Long Term Goals - 12/20/18 1817      PT LONG TERM GOAL #1   Title  Patient to report 0/10 dizziness with R and L sidelying test.    Time  6    Period  Weeks    Status  On-going      PT LONG TERM GOAL #2   Title  Patient to demonstrate good stability, step length, and upright posture when ambulating with LRAD.    Time  6    Period  Weeks    Status  On-going      PT LONG TERM GOAL #3   Title  Patient to report no dizziness with bed mobility.    Time  6    Period  Weeks    Status  On-going      PT LONG TERM GOAL #4   Title  Patient to score >/=45/56 on Berg to decrease risk of falls.    Time  6    Period  Weeks    Status  On-going   44/56           Plan - 12/20/18 1812    Clinical Impression Statement  Patient present with daughter. Reports that her dizziness has progressively improved. Today, has not taken Meclizine and still not having dizziness. Notes minimal dizziness with bed mobility remaining. Patient also with visible improvement in gait pattern and stability with walking around clinic today. Assessed LE strength, as this was not testing on initial eval- patient overall with good LE strength. Reviewed LE strengthening exercises to address impairments, with emphasis on using counter top for safety. Patient reported understanding. Patient scored 44/56 on Berg, indicating an increased risk of falls. Plan to continue addressing dizziness, strength,  and balance in order to decrease patient's fall risk. Patient required intermittent sitting rest breaks during session d/t fatigue, otherwise tolerated session well. Instructed patient to resume normal sleeping set up in order to assess dizziness. Patient agreeable and with no complaints at end of session.    Comorbidities  stage 4 lung CA with hx radiation, chemo, and R lobectomy, urinary incontinence, skin CA, SOB, osteoporosis, myalgia, hypothyroidism, HTN, HLD, migraines, HIH, hx of embolism, DM, back pain, R small toe surgery, B RTC repair, cervical fusion C5-6, C6-7, C7-T1, T1-2,    PT Treatment/Interventions  ADLs/Self Care Home Management;Canalith Repostioning;Cryotherapy;Moist Heat;Balance training;Therapeutic exercise;Therapeutic activities;Functional mobility training;Stair training;Gait training;Neuromuscular re-education;Patient/family education;Orthotic Fit/Training;Manual techniques;Vestibular;Taping;Energy conservation;Passive range of motion    PT Next Visit Plan  canalith repositioning    Consulted and Agree with Plan of Care  Patient;Family member/caregiver    Family Member Consulted  daughter       Patient will benefit from skilled therapeutic intervention in order to improve the following deficits and impairments:  Abnormal gait, Decreased activity tolerance, Decreased strength, Decreased balance, Decreased mobility, Difficulty walking, Improper body mechanics, Decreased range of motion, Impaired flexibility, Postural dysfunction  Visit Diagnosis: Dizziness and giddiness  Unsteadiness on feet  Difficulty in walking, not elsewhere classified     Problem List Patient Active Problem List   Diagnosis Date Noted  . Dizziness 11/12/2018  . Subdural hematoma ([place]) 11/12/2018  . Nasal bleeding 11/12/2018  . Intracranial bleed ([place]) 11/09/2018  . Vitamin B 12 deficiency 03/10/2018  . Goals of care, counseling/discussion 11/12/2017  . IDA (iron deficiency anemia) 03/12/2017   . Radiculopathy 02/17/2017  . Lung cancer metastatic to bone ([place]) 02/05/2017  . Thyrotoxicosis without thyroid storm 01/18/2017  . Bilateral hearing loss 08/27/2016  . Knee injury 08/27/2016  . Toxic multinodular goiter w/o crisis 06/22/2016  . Fatigue 05/05/2016  . Macular degeneration 03/09/2016  . Encounter for antineoplastic chemotherapy 12/19/2015  . Meralgia paresthetica of left side 07/10/2015  . Osteoporosis 07/10/2015  . Onychomycosis 07/10/2015  . Diabetes ([place]) 07/10/2015  . Cough 12/12/2013  . Pneumothorax after biopsy 07/09/2011    Class: Acute  . Primary adenocarcinoma of lower lobe of right lung ([place]) 06/24/2011  . Hypercholesterolemia 10/20/2010  . Essential hypertension, benign 10/20/2010  . Osteoarthritis 10/20/2010     Janene Harvey, PT, DPT 12/20/18 6:19 PM   [facility] [address]  [place], [place], [address] Phone: [phone]   Fax:  [phone]  Name:  JANNE FAULK MRN: [id number] Date of Birth: Jun 28, 1927

## 2018-12-20 NOTE — Progress Notes (Signed)
Subjective:    Patient ID: Samantha Quinn, female    DOB: 1927-10-18, 85 y.o.   MRN: [id number]  HPI The patient is here for follow up.  Her daughter is with her.  Her appetite has improved slightly since her fall, but she still has a decreased appetite and sometimes has to force herself to eat.  She often cannot find anything that she thinks sounds appealing.  Hypertension: She is taking her medication daily. She is compliant with a low sodium diet.  She denies chest pain, palpitations, edema   and regular headaches.      Diabetes: She is taking her medication daily as prescribed. She is compliant with a diabetic diet.  She monitors her sugars and they have been good at home.    Hyperlipidemia: She is taking her medication daily. She is compliant with a low fat/cholesterol diet. She denies myalgias.   Metastatic lung cancer: She has an appointment with Dr. Marin Olp tomorrow to review her recent PET scan and blood work.   She has done PT for her vestibular system and her dizziness has resolved.  She is currently doing some balance exercises with physical therapy.    Since her fall she has been having right-sided headaches/right ear pain, but over the past couple of days that has improved and she has not had either today.  Her right sided hearing is worse and she plans on following up with audiology.  Medications and allergies reviewed with patient and updated if appropriate.  Patient Active Problem List   Diagnosis Date Noted  . Dizziness 11/12/2018  . Subdural hematoma ([place]) 11/12/2018  . Nasal bleeding 11/12/2018  . Intracranial bleed ([place]) 11/09/2018  . Vitamin B 12 deficiency 03/10/2018  . Goals of care, counseling/discussion 11/12/2017  . IDA (iron deficiency anemia) 03/12/2017  . Radiculopathy 02/17/2017  . Lung cancer metastatic to bone ([place]) 02/05/2017  . Thyrotoxicosis without thyroid storm 01/18/2017  . Bilateral hearing loss 08/27/2016  . Knee injury 08/27/2016  .  Toxic multinodular goiter w/o crisis 06/22/2016  . Macular degeneration 03/09/2016  . Encounter for antineoplastic chemotherapy 12/19/2015  . Meralgia paresthetica of left side 07/10/2015  . Osteoporosis 07/10/2015  . Onychomycosis 07/10/2015  . Diabetes ([place]) 07/10/2015  . Pneumothorax after biopsy 07/09/2011    Class: Acute  . Primary adenocarcinoma of lower lobe of right lung ([place]) 06/24/2011  . Hypercholesterolemia 10/20/2010  . Essential hypertension, benign 10/20/2010  . Osteoarthritis 10/20/2010    Current Outpatient Medications on File Prior to Visit  Medication Sig Dispense Refill  . acetaminophen (TYLENOL) 500 MG tablet Take 500 mg by mouth every 6 (six) hours as needed for mild pain.    [name] amitriptyline (ELAVIL) 10 MG tablet TAKE 1 TABLET BY MOUTH EVERYDAY AT BEDTIME 90 tablet 1  . amLODipine (NORVASC) 5 MG tablet Take 1 tablet (5 mg total) by mouth daily. (Patient taking differently: Take 5 mg by mouth every other day. ) 90 tablet 1  . bisoprolol-hydrochlorothiazide (ZIAC) 2.5-6.25 MG tablet TAKE 1 TABLET BY MOUTH EVERY DAY 90 tablet 1  . Blood Glucose Monitoring Suppl (ONE TOUCH ULTRA 2) w/Device KIT Use to check blood sugars twice a day Dx E11.9 1 each 0  . cholecalciferol (VITAMIN D) 1000 units tablet Take 2,000 Units by mouth daily.     [name] gabapentin (NEURONTIN) 100 MG capsule Take 100 mg by mouth every evening.     [name] glucose blood (ONE TOUCH ULTRA TEST) test strip 1 each by Other  route 2 (two) times daily. Use to check blood sugars twice a day 100 each 3  . HYDROcodone-Acetaminophen 5-300 MG TABS Take 1 tablet by mouth every 6 (six) hours as needed. 60 tablet 0  . Lancets (ONETOUCH ULTRASOFT) lancets 1 each by Other route 2 (two) times daily. Use to help check blood sugars twice a day 100 each 3  . lovastatin (MEVACOR) 20 MG tablet TAKE 1 TABLET BY MOUTH EVERY OTHER DAY 45 tablet 3  . meclizine (ANTIVERT) 12.5 MG tablet Take 1-2 tablets (12.5-25 mg total) by mouth 3  (three) times daily as needed for dizziness. 60 tablet 1  . metFORMIN (GLUCOPHAGE) 500 MG tablet Take 1 tablet (500 mg total) by mouth 2 (two) times daily with a meal. 180 tablet 1  . methimazole (TAPAZOLE) 5 MG tablet Take 2.5 mg every other day 45 tablet 2  . Multiple Vitamins-Minerals (VISION-VITE PRESERVE PO) Take 1 tablet by mouth daily.     . polyethylene glycol (MIRALAX / GLYCOLAX) 17 g packet Take 17 g by mouth daily as needed for mild constipation. 14 each 0  . senna (SENOKOT) 8.6 MG TABS tablet Take 1 tablet (8.6 mg total) by mouth 2 (two) times daily. 30 tablet 0  . [DISCONTINUED] prochlorperazine (COMPAZINE) 10 MG tablet Take 1 tablet (10 mg total) by mouth every 6 (six) hours as needed (Nausea or vomiting). 30 tablet 1   No current facility-administered medications on file prior to visit.     Past Medical History:  Diagnosis Date  . Anxiety   . Back pain    bulding disc  . Bronchitis    hx of-many,many years   . Diabetes mellitus   . Diabetes mellitus    type 2 and takes Metformin bid  . Dyslipidemia   . Embolism - blood clot    hx of in left lower leg and was on Coumadin for about 51month;this was about 8-162yrago  . Family history of adverse reaction to anesthesia    Daughter- Nausea  . Goals of care, counseling/discussion 11/12/2017  . History of colonic polyps   . History of radiation therapy 01/06/16-02/19/16   The primary tumor and involved mediastinal adenopathy were treated to 66 Gy in 33 fractions of 2 Gy.  . Marland Kitchenistory of radiation therapy 04/01/17-04/26/17   cervical spine 35 Gy in 14 fractions  . HOH (hard of hearing)   . Hx of migraines    as a teenager  . Hypercholesterolemia   . Hyperlipidemia    takes Lovastatin every other day  . Hypertension    takes Ziac daily and Ramipril as well  . Hyperthyroidism   . Hypothyroidism    takes Synthroid every other day  . Insomnia    takes Elavil nightly  . Joint pain   . Lung mass   . Myalgia   .  Osteoarthritis   . Osteoporosis   . Pneumothorax of right lung after biopsy 07/09/11   HAD CHEST TUBE PLACEMENT  . PONV (postoperative nausea and vomiting)   . Primary adenocarcinoma of [place] of [place] [facility] [place] [place]   pT2a, pN0 M0 Stage 1 Well differenced Adenocarcinoma 3.5 cm resected 07/17/2011  . [place] of [facility] [place]  . Shortness of breath dyspnea    With exertion  . Skin cancer    legs have dark spots on them  . Upper respiratory infection 04/2011  . Urinary incontinence    takes Detrol daily prn    Past Surgical History:  Procedure Laterality Date  . ABDOMINAL HYSTERECTOMY    . ANTERIOR CERVICAL DECOMP/DISCECTOMY FUSION N/A 02/17/2017   Procedure: ANTERIOR CERVICAL DECOMPRESSION FUSION, CERVICAL 6-7 WITH INSTRUMENTATION AND ALLOGRAFT;  Surgeon: Phylliss Bob, MD;  Location: [facility];  Service: Orthopedics;  Laterality: N/A;  ANTERIOR CERVICAL DECOMPRESSION FUSION, CERVICAL 6-7 WITH INSTRUMENTATION AND ALLOGRAFT; REQUEST 2 HOURS AND FLIP   . APPENDECTOMY     as a child  . CHEST TUBE REMOVAL  07/13/11   RIGHT  CHEST TUBE  . CHOLECYSTECTOMY    . COLONOSCOPY    . COLONOSCOPY    . DILATION AND CURETTAGE OF UTERUS     x 2  . EYE SURGERY Bilateral    Cataract  . HEMORRHOID SURGERY    . IR THORACENTESIS ASP PLEURAL SPACE W/IMG GUIDE  02/03/2018  . LUNG BIOPSY  07/09/11   RIGHT UPER LOBE LUNG MASS   . LUNG LOBECTOMY Right 06/2011  . POSTERIOR CERVICAL FUSION/FORAMINOTOMY N/A 02/18/2017   Procedure: CERVICAL Five-Six, CERVICAL Six-Seven, CERVICAL Seven-THORACIC One, THORACIC One-Two POSTERIOR SPINAL FUSION WITH INSTRUMENTATION AND ALLOGRAFT;  Surgeon: Phylliss Bob, MD;  Location: [facility];  Service: Orthopedics;  Laterality: N/A;  . RIGHT CHEST TUBE PLACEMENT  07/10/11   S/P LUNG BX  . ROTATOR CUFF REPAIR     bilateral  . TOE SURGERY  2013   right small toe  . TONSILLECTOMY     as a child  . VIDEO BRONCHOSCOPY  07/17/2011   Procedure: VIDEO  BRONCHOSCOPY;  Surgeon: Grace Isaac, MD;  Location: [facility];  Service: Thoracic;  Laterality: N/A;  . VIDEO BRONCHOSCOPY WITH ENDOBRONCHIAL ULTRASOUND N/A 12/10/2015   Procedure: VIDEO BRONCHOSCOPY WITH ENDOBRONCHIAL ULTRASOUND with biopsies.;  Surgeon: Grace Isaac, MD;  Location: [facility] OR;  Service: Thoracic;  Laterality: N/A;    Social History   Socioeconomic History  . Marital status: Widowed    Spouse name: Not on file  . Number of children: 3  . Years of education: Not on file  . Highest education level: Not on file  Occupational History  . Not on file  Social Needs  . Financial resource strain: Not hard at all  . Food insecurity    Worry: Never true    Inability: Never true  . Transportation needs    Medical: No    Non-medical: No  Tobacco Use  . Smoking status: Never Smoker  . Smokeless tobacco: Never Used  Substance and Sexual Activity  . Alcohol use: No    Alcohol/week: 0.0 standard drinks    Comment: "rarely have a glass of wine"  . Drug use: No  . Sexual activity: Never    Birth control/protection: Surgical  Lifestyle  . Physical activity    Days per week: 0 days    Minutes per session: 0 min  . Stress: Not at all  Relationships  . Social connections    Talks on phone: More than three times a week    Gets together: More than three times a week    Attends religious service: More than 4 times per year    Active member of club or organization: Yes    Attends meetings of clubs or organizations: More than 4 times per year    Relationship status: Widowed  Other Topics Concern  . Not on file  Social History Narrative  . Not on file    Family History  Problem Relation Age of Onset  . Hypertension Mother   . Hypertension Father   .  Arthritis Sister   . Arthritis Sister   . Diabetes Brother   . Hypertension Sister   . Anesthesia problems Neg Hx   . Hypotension Neg Hx   . Malignant hyperthermia Neg Hx   . Pseudochol deficiency Neg Hx   . Heart  attack Neg Hx     Review of Systems  Constitutional: Positive for appetite change (decreased). Negative for fever.  HENT: Negative for ear pain.   Respiratory: Positive for shortness of breath. Negative for cough and wheezing.   Cardiovascular: Negative for chest pain, palpitations and leg swelling.  Neurological: Positive for light-headedness (mild at times). Negative for dizziness and headaches.       Objective:   Vitals:   12/21/18 1323  BP: 122/68  Pulse: 76  Resp: 16  Temp: 98.9 F (37.2 C)  SpO2: 99%   BP Readings from Last 3 Encounters:  12/21/18 122/68  12/05/18 125/60  12/01/18 (!) 108/57   Wt Readings from Last 3 Encounters:  12/21/18 111 lb (50.3 kg)  11/24/18 110 lb (49.9 kg)  11/11/18 113 lb (51.3 kg)   Body mass index is 20.97 kg/m.   Physical Exam    Constitutional: Appears well-developed and well-nourished. No distress.  HENT:  Head: Normocephalic and atraumatic.  Neck: Neck supple. No tracheal deviation present. No thyromegaly present.  No cervical lymphadenopathy Cardiovascular: Normal rate, regular rhythm and normal heart sounds.  No murmur heard. No carotid bruit .  No edema Pulmonary/Chest: Effort normal and breath sounds normal. No respiratory distress. No has no wheezes. No rales.  Skin: Skin is warm and dry. Not diaphoretic.  Psychiatric: Normal mood and affect. Behavior is normal.      Assessment & Plan:    See Problem List for Assessment and Plan of chronic medical problems.

## 2018-12-21 ENCOUNTER — Ambulatory Visit (INDEPENDENT_AMBULATORY_CARE_PROVIDER_SITE_OTHER): Payer: Medicare Other | Admitting: Internal Medicine

## 2018-12-21 ENCOUNTER — Other Ambulatory Visit: Payer: Self-pay

## 2018-12-21 ENCOUNTER — Encounter: Payer: Self-pay | Admitting: Internal Medicine

## 2018-12-21 VITALS — BP 122/68 | HR 76 | Temp 98.9°F | Resp 16 | Ht 61.0 in | Wt 111.0 lb

## 2018-12-21 DIAGNOSIS — E119 Type 2 diabetes mellitus without complications: Secondary | ICD-10-CM | POA: Diagnosis not present

## 2018-12-21 DIAGNOSIS — I10 Essential (primary) hypertension: Secondary | ICD-10-CM

## 2018-12-21 DIAGNOSIS — E7800 Pure hypercholesterolemia, unspecified: Secondary | ICD-10-CM

## 2018-12-21 DIAGNOSIS — E0800 Diabetes mellitus due to underlying condition with hyperosmolarity without nonketotic hyperglycemic-hyperosmolar coma (NKHHC): Secondary | ICD-10-CM | POA: Diagnosis not present

## 2018-12-21 DIAGNOSIS — C3490 Malignant neoplasm of unspecified part of unspecified bronchus or lung: Secondary | ICD-10-CM | POA: Diagnosis not present

## 2018-12-21 DIAGNOSIS — Z85828 Personal history of other malignant neoplasm of skin: Secondary | ICD-10-CM | POA: Diagnosis not present

## 2018-12-21 DIAGNOSIS — C7951 Secondary malignant neoplasm of bone: Secondary | ICD-10-CM

## 2018-12-21 DIAGNOSIS — D485 Neoplasm of uncertain behavior of skin: Secondary | ICD-10-CM | POA: Diagnosis not present

## 2018-12-21 DIAGNOSIS — Z85820 Personal history of malignant melanoma of skin: Secondary | ICD-10-CM | POA: Diagnosis not present

## 2018-12-21 DIAGNOSIS — L570 Actinic keratosis: Secondary | ICD-10-CM | POA: Diagnosis not present

## 2018-12-21 LAB — POCT GLYCOSYLATED HEMOGLOBIN (HGB A1C): Hemoglobin A1C: 6 % — AB (ref 4.0–5.6)

## 2018-12-21 MED ORDER — METFORMIN HCL 500 MG PO TABS: 500.0000 mg | ORAL_TABLET | Freq: Every day | ORAL | 1 refills | Status: DC

## 2018-12-21 NOTE — Assessment & Plan Note (Addendum)
Has f/u with Dr Marin Olp tomorrow to review PET and blood work I did talk to her and her daughter about the benefits of palliative care

## 2018-12-21 NOTE — Assessment & Plan Note (Addendum)
a1c today Eating less, sugars well controlled at home We will decrease metformin to once daily Advised her to monitor her sugars closely-May need to discontinue metformin at some point

## 2018-12-21 NOTE — Assessment & Plan Note (Signed)
continue statin

## 2018-12-21 NOTE — Assessment & Plan Note (Signed)
BP well controlled Current regimen effective and well tolerated Continue current medications at current doses  

## 2018-12-22 ENCOUNTER — Encounter: Payer: Self-pay | Admitting: Hematology & Oncology

## 2018-12-22 ENCOUNTER — Inpatient Hospital Stay: Payer: Medicare Other

## 2018-12-22 ENCOUNTER — Inpatient Hospital Stay: Payer: Medicare Other | Attending: Hematology & Oncology | Admitting: Hematology & Oncology

## 2018-12-22 ENCOUNTER — Other Ambulatory Visit: Payer: Self-pay

## 2018-12-22 VITALS — BP 127/70 | HR 81 | Temp 97.7°F | Resp 19 | Wt 111.0 lb

## 2018-12-22 DIAGNOSIS — C7951 Secondary malignant neoplasm of bone: Secondary | ICD-10-CM | POA: Diagnosis not present

## 2018-12-22 DIAGNOSIS — Z885 Allergy status to narcotic agent status: Secondary | ICD-10-CM | POA: Insufficient documentation

## 2018-12-22 DIAGNOSIS — Z923 Personal history of irradiation: Secondary | ICD-10-CM | POA: Insufficient documentation

## 2018-12-22 DIAGNOSIS — C3431 Malignant neoplasm of lower lobe, right bronchus or lung: Secondary | ICD-10-CM

## 2018-12-22 DIAGNOSIS — Z888 Allergy status to other drugs, medicaments and biological substances status: Secondary | ICD-10-CM | POA: Insufficient documentation

## 2018-12-22 DIAGNOSIS — M549 Dorsalgia, unspecified: Secondary | ICD-10-CM | POA: Insufficient documentation

## 2018-12-22 DIAGNOSIS — M542 Cervicalgia: Secondary | ICD-10-CM | POA: Insufficient documentation

## 2018-12-22 DIAGNOSIS — J90 Pleural effusion, not elsewhere classified: Secondary | ICD-10-CM | POA: Diagnosis not present

## 2018-12-22 DIAGNOSIS — D508 Other iron deficiency anemias: Secondary | ICD-10-CM

## 2018-12-22 DIAGNOSIS — Z9221 Personal history of antineoplastic chemotherapy: Secondary | ICD-10-CM | POA: Diagnosis not present

## 2018-12-22 DIAGNOSIS — R0602 Shortness of breath: Secondary | ICD-10-CM | POA: Insufficient documentation

## 2018-12-22 DIAGNOSIS — Z79899 Other long term (current) drug therapy: Secondary | ICD-10-CM | POA: Diagnosis not present

## 2018-12-22 LAB — CBC WITH DIFFERENTIAL (CANCER CENTER ONLY)
Abs Immature Granulocytes: 0.11 10*3/uL — ABNORMAL HIGH (ref 0.00–0.07)
Basophils Absolute: 0 10*3/uL (ref 0.0–0.1)
Basophils Relative: 0 %
Eosinophils Absolute: 0.1 10*3/uL (ref 0.0–0.5)
Eosinophils Relative: 1 %
HCT: 34.1 % — ABNORMAL LOW (ref 36.0–46.0)
Hemoglobin: 11 g/dL — ABNORMAL LOW (ref 12.0–15.0)
Immature Granulocytes: 2 %
Lymphocytes Relative: 5 %
Lymphs Abs: 0.3 10*3/uL — ABNORMAL LOW (ref 0.7–4.0)
MCH: 29.8 pg (ref 26.0–34.0)
MCHC: 32.3 g/dL (ref 30.0–36.0)
MCV: 92.4 fL (ref 80.0–100.0)
Monocytes Absolute: 0.6 10*3/uL (ref 0.1–1.0)
Monocytes Relative: 8 %
Neutro Abs: 6.4 10*3/uL (ref 1.7–7.7)
Neutrophils Relative %: 84 %
Platelet Count: 215 10*3/uL (ref 150–400)
RBC: 3.69 MIL/uL — ABNORMAL LOW (ref 3.87–5.11)
RDW: 13.7 % (ref 11.5–15.5)
WBC Count: 7.6 10*3/uL (ref 4.0–10.5)
nRBC: 0 % (ref 0.0–0.2)

## 2018-12-22 LAB — CMP (CANCER CENTER ONLY)
ALT: 7 U/L (ref 0–44)
AST: 14 U/L — ABNORMAL LOW (ref 15–41)
Albumin: 4.3 g/dL (ref 3.5–5.0)
Alkaline Phosphatase: 191 U/L — ABNORMAL HIGH (ref 38–126)
Anion gap: 10 (ref 5–15)
BUN: 15 mg/dL (ref 8–23)
CO2: 26 mmol/L (ref 22–32)
Calcium: 9.1 mg/dL (ref 8.9–10.3)
Chloride: 103 mmol/L (ref 98–111)
Creatinine: 0.74 mg/dL (ref 0.44–1.00)
GFR, Est AFR Am: >60 mL/min (ref >60)
GFR, Estimated: >60 mL/min (ref >60)
Glucose, Bld: 100 mg/dL — ABNORMAL HIGH (ref 70–99)
Potassium: 4.3 mmol/L (ref 3.5–5.1)
Sodium: 139 mmol/L (ref 135–145)
Total Bilirubin: 0.3 mg/dL (ref 0.3–1.2)
Total Protein: 7 g/dL (ref 6.5–8.1)

## 2018-12-22 MED ORDER — ZOLEDRONIC ACID 4 MG/5ML IV CONC
3.0000 mg | Freq: Once | INTRAVENOUS | Status: AC
  Administered 2018-12-22: 13:00:00 3 mg via INTRAVENOUS
  Filled 2018-12-22: qty 3.75

## 2018-12-22 NOTE — Patient Instructions (Signed)
Zoledronic Acid injection (Hypercalcemia, Oncology) What is this medicine? ZOLEDRONIC ACID (ZOE le dron ik AS id) lowers the amount of calcium loss from bone. It is used to treat too much calcium in your blood from cancer. It is also used to prevent complications of cancer that has spread to the bone. This medicine may be used for other purposes; ask your health care provider or pharmacist if you have questions. COMMON BRAND NAME(S): Zometa What should I tell my health care provider before I take this medicine? They need to know if you have any of these conditions:  aspirin-sensitive asthma  cancer, especially if you are receiving medicines used to treat cancer  dental disease or wear dentures  infection  kidney disease  receiving corticosteroids like dexamethasone or prednisone  an unusual or allergic reaction to zoledronic acid, other medicines, foods, dyes, or preservatives  pregnant or trying to get pregnant  breast-feeding How should I use this medicine? This medicine is for infusion into a vein. It is given by a health care professional in a hospital or clinic setting. Talk to your pediatrician regarding the use of this medicine in children. Special care may be needed. Overdosage: If you think you have taken too much of this medicine contact a poison control center or emergency room at once. NOTE: This medicine is only for you. Do not share this medicine with others. What if I miss a dose? It is important not to miss your dose. Call your doctor or health care professional if you are unable to keep an appointment. What may interact with this medicine?  certain antibiotics given by injection  NSAIDs, medicines for pain and inflammation, like ibuprofen or naproxen  some diuretics like bumetanide, furosemide  teriparatide  thalidomide This list may not describe all possible interactions. Give your health care provider a list of all the medicines, herbs, non-prescription  drugs, or dietary supplements you use. Also tell them if you smoke, drink alcohol, or use illegal drugs. Some items may interact with your medicine. What should I watch for while using this medicine? Visit your doctor or health care professional for regular checkups. It may be some time before you see the benefit from this medicine. Do not stop taking your medicine unless your doctor tells you to. Your doctor may order blood tests or other tests to see how you are doing. Women should inform their doctor if they wish to become pregnant or think they might be pregnant. There is a potential for serious side effects to an unborn child. Talk to your health care professional or pharmacist for more information. You should make sure that you get enough calcium and vitamin D while you are taking this medicine. Discuss the foods you eat and the vitamins you take with your health care professional. Some people who take this medicine have severe bone, joint, and/or muscle pain. This medicine may also increase your risk for jaw problems or a broken thigh bone. Tell your doctor right away if you have severe pain in your jaw, bones, joints, or muscles. Tell your doctor if you have any pain that does not go away or that gets worse. Tell your dentist and dental surgeon that you are taking this medicine. You should not have major dental surgery while on this medicine. See your dentist to have a dental exam and fix any dental problems before starting this medicine. Take good care of your teeth while on this medicine. Make sure you see your dentist for regular follow-up   appointments. What side effects may I notice from receiving this medicine? Side effects that you should report to your doctor or health care professional as soon as possible:  allergic reactions like skin rash, itching or hives, swelling of the face, lips, or tongue  anxiety, confusion, or depression  breathing problems  changes in vision  eye  pain  feeling faint or lightheaded, falls  jaw pain, especially after dental work  mouth sores  muscle cramps, stiffness, or weakness  redness, blistering, peeling or loosening of the skin, including inside the mouth  trouble passing urine or change in the amount of urine Side effects that usually do not require medical attention (report to your doctor or health care professional if they continue or are bothersome):  bone, joint, or muscle pain  constipation  diarrhea  fever  hair loss  irritation at site where injected  loss of appetite  nausea, vomiting  stomach upset  trouble sleeping  trouble swallowing  weak or tired This list may not describe all possible side effects. Call your doctor for medical advice about side effects. You may report side effects to FDA at 1-800-FDA-1088. Where should I keep my medicine? This drug is given in a hospital or clinic and will not be stored at home. NOTE: This sheet is a summary. It may not cover all possible information. If you have questions about this medicine, talk to your doctor, pharmacist, or health care provider.  2020 Elsevier/Gold Standard (2013-09-09 14:19:39)  

## 2018-12-22 NOTE — Progress Notes (Signed)
Hematology and Oncology Follow Up Visit  Samantha Quinn [id number] [birth year] 85 y.o. 12/22/2018   Principle Diagnosis:  Stage IV adenocarcinoma of the right lung-mutation negative C6-7 left nerve root compression secondary to metastatic disease - PD-L1 (0%) /TMB of 9  Past Therapy: Status post radiation/chemotherapy for recurrence-completed October 2017 Status post right upper lobectomy in March 2013 for stage IB disease XRT to the cervical spine - started 04/02/2017  Tecentriq 1200 mg IV every 3 weeks s/p cycle 8 -- d/c progression  Current Therapy:   Alimta/Avastin s/p cycle #5 -- d/c on 05/06/2018 Zometa 4 mg IV q4 weeks -next dose on  August 2020    Interim History:  Samantha Quinn is here today for follow-up.  Overall, I would have to say that she is doing fairly well.  We did a pet scan on her.  This was done on 12/19/2018.  It did show some mild disease progression.  There is no new areas of disease.  What she had was a little bit more active and maybe a little more a larger.  She does have this large right pleural effusion.  This is what I am worried about.  Samantha Quinn says that she has a little bit of shortness of breath when she exerts herself.  Because of this, I think we probably need to try to get the fluid off her if possible.  I talked to her about this.  Again, overall my have to say that given the overall "picture" I think that everything is doing pretty well with her.  I am happy that the CAT scan did not show anything new with her lung cancer.  She is really not complaining of any pain.  She has had no falling.  She is getting physical therapy which is helping.  Overall, her performance status is ECOG 2.       Her appetite seems to be doing okay.  She has not lost any weight.       [place] region.  There is been no fever.  She has had no change in bowel or bladder habits.  She has had no leg swelling.  There has been no nausea or vomiting.  Overall,  her performance  status is ECOG 2.    Medications:    Allergies:  Allergies  Allergen Reactions  . Ramipril Other (See Comments)    Tongue swelling  . Tramadol Other (See Comments)    syncope syncope  . Codeine Nausea And Vomiting  . Demerol Nausea Only    Past Medical History, Surgical history, Social history, and Family History were reviewed and updated.  Review of Systems: Review of Systems  Constitutional: Negative.   HENT: Negative.   Eyes: Negative.   Respiratory: Negative.   Cardiovascular: Negative.   Gastrointestinal: Negative.   Genitourinary: Negative.   Musculoskeletal: Positive for back pain and neck pain.  Skin: Negative.   Neurological: Negative.   Endo/Heme/Allergies: Negative.   Psychiatric/Behavioral: Negative.      Physical Exam:  weight is 111 lb (50.3 kg). Her oral temperature is 97.7 F (36.5 C). Her blood pressure is 127/70 and her pulse is 81. Her respiration is 19 and oxygen saturation is 98%.   Wt Readings from Last 3 Encounters:  12/22/18 111 lb (50.3 kg)  12/21/18 111 lb (50.3 kg)  11/24/18 110 lb (49.9 kg)    Physical Exam Vitals signs reviewed.  HENT:     Head: Normocephalic and atraumatic.  Eyes:  Pupils: Pupils are equal, round, and reactive to light.  Neck:     Musculoskeletal: Normal range of motion.  Cardiovascular:     Rate and Rhythm: Normal rate and regular rhythm.     Heart sounds: Normal heart sounds.  Pulmonary:     Effort: Pulmonary effort is normal.     Breath sounds: Normal breath sounds.  Abdominal:     General: Bowel sounds are normal.     Palpations: Abdomen is soft.  Musculoskeletal: Normal range of motion.        General: No tenderness or deformity.  Lymphadenopathy:     Cervical: No cervical adenopathy.  Skin:    General: Skin is warm and dry.     Findings: No erythema or rash.  Neurological:     Mental Status: She is alert and oriented to person, place, and time.  Psychiatric:         Behavior: Behavior normal.        Thought Content: Thought content normal.        Judgment: Judgment normal.      Lab Results  Component Value Date   WBC 7.6 12/22/2018   HGB 11.0 (L) 12/22/2018   HCT 34.1 (L) 12/22/2018   MCV 92.4 12/22/2018   PLT 215 12/22/2018   Lab Results  Component Value Date   FERRITIN 1,021 (H) 11/24/2018   IRON 57 11/24/2018   TIBC 273 11/24/2018   UIBC 216 11/24/2018   IRONPCTSAT 21 11/24/2018   Lab Results  Component Value Date   RETICCTPCT 2.1 05/06/2018   RBC 3.69 (L) 12/22/2018   No results found for: KPAFRELGTCHN, LAMBDASER, KAPLAMBRATIO No results found for: IGGSERUM, IGA, IGMSERUM No results found for: Odetta Pink, SPEI   Chemistry      Component Value Date/Time   NA 139 12/22/2018 1121   NA 144 04/07/2017 1215   NA 135 (L) 03/11/2017 1416   K 4.3 12/22/2018 1121   K 4.0 04/07/2017 1215   K 3.9 03/11/2017 1416   CL 103 12/22/2018 1121   CL 103 04/07/2017 1215   CO2 26 12/22/2018 1121   CO2 29 04/07/2017 1215   CO2 23 03/11/2017 1416   BUN 15 12/22/2018 1121   BUN 13 04/07/2017 1215   BUN 16.1 03/11/2017 1416   CREATININE 0.74 12/22/2018 1121   CREATININE 0.8 04/07/2017 1215   CREATININE 0.9 03/11/2017 1416      Component Value Date/Time   CALCIUM 9.1 12/22/2018 1121   CALCIUM 9.6 04/07/2017 1215   CALCIUM 9.6 03/11/2017 1416   ALKPHOS 191 (H) 12/22/2018 1121   ALKPHOS 60 04/07/2017 1215   ALKPHOS 79 03/11/2017 1416   AST 14 (L) 12/22/2018 1121   AST 11 03/11/2017 1416   ALT 7 12/22/2018 1121   ALT 15 04/07/2017 1215   ALT 11 03/11/2017 1416   BILITOT 0.3 12/22/2018 1121   BILITOT 0.26 03/11/2017 1416      Impression and Plan: Samantha Quinn is a very pleasant 85 yo caucasian female with progressive metastatic non small cell adenocarcinoma of the lung.  I am glad that her overall quality of life is doing little bit better.  Again, I am happy that the PET scan  really does not show a lot in the way of disease progression that we would really have to worry about.  We will see about getting the pleural fluid taken off her.  She will get her Zometa today.  I will  have her come back in about 5 weeks for her next appointment and for also her next Zometa.   [name], MD 8/27/20201:41 PM

## 2018-12-23 ENCOUNTER — Telehealth: Payer: Self-pay | Admitting: Hematology & Oncology

## 2018-12-23 ENCOUNTER — Ambulatory Visit: Payer: Medicare Other | Admitting: Physical Therapy

## 2018-12-23 LAB — FERRITIN: Ferritin: 782 ng/mL — ABNORMAL HIGH (ref 11–307)

## 2018-12-23 NOTE — Telephone Encounter (Signed)
Called and spoke with patient regarding appointments for Thoracentesis and COVID testing that had to be done prior to procedure.  She voiced understanding that she would need to go to [facility] for COVID test and then she would go to [facility] on 9/3 for Thoracentesis.

## 2018-12-25 ENCOUNTER — Encounter: Payer: Self-pay | Admitting: Internal Medicine

## 2018-12-25 DIAGNOSIS — C3490 Malignant neoplasm of unspecified part of unspecified bronchus or lung: Secondary | ICD-10-CM

## 2018-12-26 ENCOUNTER — Other Ambulatory Visit: Payer: Self-pay

## 2018-12-26 ENCOUNTER — Encounter: Payer: Self-pay | Admitting: Physical Therapy

## 2018-12-26 ENCOUNTER — Ambulatory Visit: Payer: Medicare Other | Admitting: Physical Therapy

## 2018-12-26 ENCOUNTER — Other Ambulatory Visit (HOSPITAL_COMMUNITY)
Admission: RE | Admit: 2018-12-26 | Discharge: 2018-12-26 | Disposition: A | Discharge status: Home / Self Care | Payer: Medicare Other | Source: Ambulatory Visit | Attending: Hematology & Oncology | Admitting: Hematology & Oncology

## 2018-12-26 VITALS — BP 122/70 | HR 88

## 2018-12-26 DIAGNOSIS — R262 Difficulty in walking, not elsewhere classified: Secondary | ICD-10-CM | POA: Diagnosis not present

## 2018-12-26 DIAGNOSIS — R42 Dizziness and giddiness: Secondary | ICD-10-CM | POA: Diagnosis not present

## 2018-12-26 DIAGNOSIS — R2681 Unsteadiness on feet: Secondary | ICD-10-CM | POA: Diagnosis not present

## 2018-12-26 DIAGNOSIS — C3431 Malignant neoplasm of lower lobe, right bronchus or lung: Secondary | ICD-10-CM | POA: Diagnosis not present

## 2018-12-26 DIAGNOSIS — Z01812 Encounter for preprocedural laboratory examination: Secondary | ICD-10-CM | POA: Diagnosis not present

## 2018-12-26 DIAGNOSIS — J90 Pleural effusion, not elsewhere classified: Secondary | ICD-10-CM | POA: Diagnosis not present

## 2018-12-26 DIAGNOSIS — Z20828 Contact with and (suspected) exposure to other viral communicable diseases: Secondary | ICD-10-CM | POA: Diagnosis not present

## 2018-12-26 NOTE — Therapy (Signed)
[facility] [address]  [place], [place], [address] Phone: [phone]   Fax:  [phone]  Physical Therapy Treatment  Patient Details  Name: Samantha Quinn MRN: [id number] Date of Birth: 08-04-1927 Referring Provider (PT): Burney Gauze, MD   Encounter Date: 12/26/2018  PT End of Session - 12/26/18 1650    Visit Number  4    Number of Visits  13    Date for PT Re-Evaluation  01/12/19    Authorization Type  Medicare & BCBS    PT Start Time  1611    PT Stop Time  1647    PT Time Calculation (min)  36 min    Activity Tolerance  Patient tolerated treatment well   limited by dizziness   Behavior During Therapy  [facility] for tasks assessed/performed       Past Medical History:  Diagnosis Date  . Anxiety   . Back pain    bulding disc  . Bronchitis    hx of-many,many years   . Diabetes mellitus   . Diabetes mellitus    type 2 and takes Metformin bid  . Dyslipidemia   . Embolism - blood clot    hx of in left lower leg and was on Coumadin for about 32months;this was about 8-41yrs ago  . Family history of adverse reaction to anesthesia    Daughter- Nausea  . Goals of care, counseling/discussion 11/12/2017  . History of colonic polyps   . History of radiation therapy 01/06/16-02/19/16   The primary tumor and involved mediastinal adenopathy were treated to 66 Gy in 33 fractions of 2 Gy.  [name] History of radiation therapy 04/01/17-04/26/17   cervical spine 35 Gy in 14 fractions  . HOH (hard of hearing)   . Hx of migraines    as a teenager  . Hypercholesterolemia   . Hyperlipidemia    takes Lovastatin every other day  . Hypertension    takes Ziac daily and Ramipril as well  . Hyperthyroidism   . Hypothyroidism    takes Synthroid every other day  . Insomnia    takes Elavil nightly  . Joint pain   . Lung mass   . Myalgia   . Osteoarthritis   . Osteoporosis   . Pneumothorax of right lung after biopsy 07/09/11    HAD CHEST TUBE PLACEMENT  . PONV (postoperative nausea and vomiting)   . Primary adenocarcinoma of lower lobe of right lung ([place]) 06/24/2011   pT2a, pN0 M0 Stage 1 Well differenced Adenocarcinoma 3.5 cm resected 07/17/2011  . Primary adenocarcinoma of lung ([place]) 06/24/2011  . Shortness of breath dyspnea    With exertion  . Skin cancer    legs have dark spots on them  . Upper respiratory infection 04/2011  . Urinary incontinence    takes Detrol daily prn    Past Surgical History:  Procedure Laterality Date  . ABDOMINAL HYSTERECTOMY    . ANTERIOR CERVICAL DECOMP/DISCECTOMY FUSION N/A 02/17/2017   Procedure: ANTERIOR CERVICAL DECOMPRESSION FUSION, CERVICAL 6-7 WITH INSTRUMENTATION AND ALLOGRAFT;  Surgeon: Phylliss Bob, MD;  Location: [facility];  Service: Orthopedics;  Laterality: N/A;  ANTERIOR CERVICAL DECOMPRESSION FUSION, CERVICAL 6-7 WITH INSTRUMENTATION AND ALLOGRAFT; REQUEST 2 HOURS AND FLIP   . APPENDECTOMY     as a child  . CHEST TUBE REMOVAL  07/13/11   RIGHT  CHEST TUBE  . CHOLECYSTECTOMY    . COLONOSCOPY    . COLONOSCOPY    .  DILATION AND CURETTAGE OF UTERUS     x 2  . EYE SURGERY Bilateral    Cataract  . HEMORRHOID SURGERY    . IR THORACENTESIS ASP PLEURAL SPACE W/IMG GUIDE  02/03/2018  . LUNG BIOPSY  07/09/11   RIGHT UPER LOBE LUNG MASS   . LUNG LOBECTOMY Right 06/2011  . POSTERIOR CERVICAL FUSION/FORAMINOTOMY N/A 02/18/2017   Procedure: CERVICAL Five-Six, CERVICAL Six-Seven, CERVICAL Seven-THORACIC One, THORACIC One-Two POSTERIOR SPINAL FUSION WITH INSTRUMENTATION AND ALLOGRAFT;  Surgeon: Phylliss Bob, MD;  Location: [facility];  Service: Orthopedics;  Laterality: N/A;  . RIGHT CHEST TUBE PLACEMENT  07/10/11   S/P LUNG BX  . ROTATOR CUFF REPAIR     bilateral  . TOE SURGERY  2013   right small toe  . TONSILLECTOMY     as a child  . VIDEO BRONCHOSCOPY  07/17/2011   Procedure: VIDEO BRONCHOSCOPY;  Surgeon: Grace Isaac, MD;  Location: [facility];  Service: Thoracic;   Laterality: N/A;  . VIDEO BRONCHOSCOPY WITH ENDOBRONCHIAL ULTRASOUND N/A 12/10/2015   Procedure: VIDEO BRONCHOSCOPY WITH ENDOBRONCHIAL ULTRASOUND with biopsies.;  Surgeon: Grace Isaac, MD;  Location: [facility];  Service: Thoracic;  Laterality: N/A;    Vitals:   12/26/18 1614  BP: 122/70  Pulse: 88  SpO2: 97%    Subjective Assessment - 12/26/18 1609    Subjective  Patient reports still some remaining dizziness when laying down in bed. Has returned to sleeping normally, on one pillow. R ear pain is pretty much gone.    Pertinent History  stage 4 lung CA with hx radiation, chemo, and R lobectomy, urinary incontinence, skin CA, SOB, osteoporosis, myalgia, hypothyroidism, HTN, HLD, migraines, HIH, hx of embolism, DM, back pain, R small toe surgery, B RTC repair, cervical fusion C5-6, C6-7, C7-T1, T1-2    Patient Stated Goals  work on my balance    Currently in Pain?  No/denies             Vestibular Assessment - 12/26/18 0001      Positional Testing   Dix-Hallpike  Dix-Hallpike Right      Dix-Hallpike Right   Dix-Hallpike Right Duration  8 sec    Dix-Hallpike Right Symptoms  Upbeat, right rotatory nystagmus   2x               Vestibular Treatment/Exercise - 12/26/18 0001      Vestibular Treatment/Exercise   Vestibular Treatment Provided  Canalith Repositioning    Canalith Repositioning  Epley Manuever Right       EPLEY MANUEVER RIGHT   Number of Reps   2    Overall Response  No change    Response Details   c/o dizziness on return up which quickly resolved   upbeat rotary nystagmus with L head turn as well           PT Education - 12/26/18 1649    Education Details  education on [place] and Epley maneuver for treatment of BPPV, edu on post-epley instructions    Person(s) Educated  Patient;Child(ren)   daughter   Methods  Explanation;Demonstration;Tactile cues;Verbal cues;Handout    Comprehension  Verbalized understanding;Returned demonstration        PT Short Term Goals - 12/05/18 1802      PT SHORT TERM GOAL #1   Title  Patient to be independent with initial HEP.    Time  3    Period  Weeks    Status  On-going    Target Date  12/22/18        PT Long Term Goals - 12/20/18 1817      PT LONG TERM GOAL #1   Title  Patient to report 0/10 dizziness with R and L sidelying test.    Time  6    Period  Weeks    Status  On-going      PT LONG TERM GOAL #2   Title  Patient to demonstrate good stability, step length, and upright posture when ambulating with LRAD.    Time  6    Period  Weeks    Status  On-going      PT LONG TERM GOAL #3   Title  Patient to report no dizziness with bed mobility.    Time  6    Period  Weeks    Status  On-going      PT LONG TERM GOAL #4   Title  Patient to score >/=45/56 on Berg to decrease risk of falls.    Time  6    Period  Weeks    Status  On-going   44/56           Plan - 12/26/18 1650    Clinical Impression Statement  Patient present with daughter. Reports that she is still having mild but persisting dizziness when laying down in bed. Educated patient on treatment options to treat dizziness. Tested R [employer] which revealed R upbeating torstional nystagmus lasting ~8 seconds. Proceeded with R Epley maneuver as patient demonstrated the necessary cervical ROM before testing despite hx of cervical fusion. Patient also with L upbeating torstional nystagmus with L head turn during Epley, suggesting L posterior canal involvement as well. Reported dizziness after completion of maneuver, which quickly resolved. Re-tested with R Marye Round, with patient demonstrating very mild R upbeating nystagmus. Re-treated with Epley maneuver. Patient again with mild dizziness upon sitting, which quickly resolved. Educated patient and daughter on post-epley instructions- both reported understanding. Patient with very mild dizziness at end of session but with no further complaints.    Comorbidities   stage 4 lung CA with hx radiation, chemo, and R lobectomy, urinary incontinence, skin CA, SOB, osteoporosis, myalgia, hypothyroidism, HTN, HLD, migraines, HIH, hx of embolism, DM, back pain, R small toe surgery, B RTC repair, cervical fusion C5-6, C6-7, C7-T1, T1-2,    PT Treatment/Interventions  ADLs/Self Care Home Management;Canalith Repostioning;Cryotherapy;Moist Heat;Balance training;Therapeutic exercise;Therapeutic activities;Functional mobility training;Stair training;Gait training;Neuromuscular re-education;Patient/family education;Orthotic Fit/Training;Manual techniques;Vestibular;Taping;Energy conservation;Passive range of motion    PT Next Visit Plan  canalith repositioning    Consulted and Agree with Plan of Care  Patient;Family member/caregiver    Family Member Consulted  daughter       Patient will benefit from skilled therapeutic intervention in order to improve the following deficits and impairments:  Abnormal gait, Decreased activity tolerance, Decreased strength, Decreased balance, Decreased mobility, Difficulty walking, Improper body mechanics, Decreased range of motion, Impaired flexibility, Postural dysfunction  Visit Diagnosis: Dizziness and giddiness  Unsteadiness on feet  Difficulty in walking, not elsewhere classified     Problem List Patient Active Problem List   Diagnosis Date Noted  . Dizziness 11/12/2018  . Subdural hematoma ([place]) 11/12/2018  . Intracranial bleed ([place]) 11/09/2018  . Vitamin B 12 deficiency 03/10/2018  . Goals of care, counseling/discussion 11/12/2017  . IDA (iron deficiency anemia) 03/12/2017  . Radiculopathy 02/17/2017  . Lung cancer metastatic to bone ([place]) 02/05/2017  . Thyrotoxicosis without thyroid storm 01/18/2017  . Bilateral hearing loss 08/27/2016  .  Toxic multinodular goiter w/o crisis 06/22/2016  . Macular degeneration 03/09/2016  . Encounter for antineoplastic chemotherapy 12/19/2015  . Meralgia paresthetica of left side  07/10/2015  . Osteoporosis 07/10/2015  . Onychomycosis 07/10/2015  . Diabetes ([place]) 07/10/2015  . Pneumothorax after biopsy 07/09/2011    Class: Acute  . Primary adenocarcinoma of lower lobe of right lung ([place]) 06/24/2011  . Hypercholesterolemia 10/20/2010  . Essential hypertension, benign 10/20/2010  . Osteoarthritis 10/20/2010     Janene Harvey, PT, DPT 12/26/18 5:01 PM   [facility] [address]  [place], [place], [address] Phone: [phone]   Fax:  [phone]  Name: Samantha Quinn MRN: [id number] Date of Birth: 1927/12/16

## 2018-12-27 LAB — SARS CORONAVIRUS 2 (TAT 6-24 HRS): SARS Coronavirus 2: NEGATIVE

## 2018-12-28 ENCOUNTER — Telehealth: Payer: Self-pay | Admitting: Internal Medicine

## 2018-12-28 MED ORDER — GLUCOSE BLOOD VI STRP: 1.0000 | ORAL_STRIP | Freq: Two times a day (BID) | 3 refills | Status: AC

## 2018-12-28 MED ORDER — ONETOUCH ULTRASOFT LANCETS MISC: 1.0000 | Freq: Two times a day (BID) | 3 refills | Status: DC

## 2018-12-28 NOTE — Telephone Encounter (Signed)
One Touch ultra Blue test strips and the Lancets.  Pharmacy:  [id number] - [place], [place] [address] [phone] (Phone) [phone] (Fax)

## 2018-12-28 NOTE — Telephone Encounter (Signed)
Referral for palliative care has been faxed over to [facility].

## 2018-12-28 NOTE — Telephone Encounter (Signed)
Rx sent 

## 2018-12-29 ENCOUNTER — Ambulatory Visit (HOSPITAL_COMMUNITY)
Admission: RE | Admit: 2018-12-29 | Discharge: 2018-12-29 | Disposition: A | Discharge status: Home / Self Care | Payer: Medicare Other | Source: Ambulatory Visit | Attending: Physician Assistant | Admitting: Physician Assistant

## 2018-12-29 ENCOUNTER — Telehealth: Payer: Self-pay | Admitting: Licensed Clinical Social Worker

## 2018-12-29 ENCOUNTER — Ambulatory Visit (HOSPITAL_COMMUNITY)
Admission: RE | Admit: 2018-12-29 | Discharge: 2018-12-29 | Disposition: A | Discharge status: Home / Self Care | Payer: Medicare Other | Source: Ambulatory Visit | Attending: Hematology & Oncology | Admitting: Hematology & Oncology

## 2018-12-29 ENCOUNTER — Encounter: Payer: Medicare Other | Admitting: Physical Therapy

## 2018-12-29 DIAGNOSIS — Z01812 Encounter for preprocedural laboratory examination: Secondary | ICD-10-CM | POA: Insufficient documentation

## 2018-12-29 DIAGNOSIS — R846 Abnormal cytological findings in specimens from respiratory organs and thorax: Secondary | ICD-10-CM | POA: Diagnosis not present

## 2018-12-29 DIAGNOSIS — J90 Pleural effusion, not elsewhere classified: Secondary | ICD-10-CM | POA: Insufficient documentation

## 2018-12-29 DIAGNOSIS — Z20828 Contact with and (suspected) exposure to other viral communicable diseases: Secondary | ICD-10-CM | POA: Insufficient documentation

## 2018-12-29 DIAGNOSIS — C3431 Malignant neoplasm of lower lobe, right bronchus or lung: Secondary | ICD-10-CM | POA: Insufficient documentation

## 2018-12-29 MED ORDER — LIDOCAINE HCL 1 % IJ SOLN
INTRAMUSCULAR | Status: AC
  Filled 2018-12-29: qty 20

## 2018-12-29 NOTE — Procedures (Signed)
PROCEDURE SUMMARY:  Successful US guided right thoracentesis. Yielded 600 mL of hazy gold fluid. Patient tolerated procedure well. No immediate complications. EBL = trace  Specimen was sent for labs.  Post procedure chest X-ray reveals no pneumothorax  Hinley Brimage S Harriett Azar PA-C 12/29/2018 12:20 PM

## 2018-12-29 NOTE — Telephone Encounter (Signed)
Palliative Care SW left a vm for patient to schedule a visit.

## 2018-12-30 ENCOUNTER — Telehealth: Payer: Self-pay

## 2018-12-30 NOTE — Telephone Encounter (Signed)
Copied from [place] [phone]. Topic: General - Other >> Dec 30, 2018  8:40 AM Pauline Good wrote: Reason for CRM:pt daughter is calling and want to know Dr Quay Burow thoughts of her mom driving. Daughter stated her mom didn't know about her concerns of her driving. Please advise  Routing to dr burns, please advise, thanks

## 2018-12-30 NOTE — Telephone Encounter (Unsigned)
Copied from [place] [phone]. Topic: General - Other >> Dec 30, 2018  8:40 AM Pauline Good wrote: Reason for CRM:pt daughter is calling and want to know Dr Quay Burow thoughts of her mom driving. Daughter stated her mom didn't know about her concerns of her driving. Please advise

## 2019-01-02 NOTE — Telephone Encounter (Signed)
If her daughter has not already she should ride with her mom and assess her driving, but it is probably best to keep driving to a minimum to none.

## 2019-01-03 ENCOUNTER — Other Ambulatory Visit: Payer: Medicare Other | Admitting: Licensed Clinical Social Worker

## 2019-01-03 ENCOUNTER — Ambulatory Visit: Payer: Medicare Other | Admitting: Physical Therapy

## 2019-01-03 ENCOUNTER — Telehealth: Payer: Self-pay | Admitting: *Deleted

## 2019-01-03 DIAGNOSIS — Z515 Encounter for palliative care: Secondary | ICD-10-CM

## 2019-01-03 NOTE — Telephone Encounter (Signed)
As noted below by Dr. Marin Olp, I informed the patient that there are no cancer cells in the fluid that was taken out from your lung. She verbalized understanding.

## 2019-01-03 NOTE — Progress Notes (Signed)
COMMUNITY PALLIATIVE CARE SW NOTE  PATIENT NAME: Samantha Quinn DOB: 18-Mar-1928 MRN: [id number]  PRIMARY CARE PROVIDER: Binnie Rail, MD  RESPONSIBLE PARTY:  Acct ID - Guarantor Home Phone Work Phone Relationship Acct Type  [id number] Samantha [phone]  Self P/F     [address] [place], [place] [address]   Due to the COVID-19 crisis, this virtual check-in visit was done via telephone from my office and it was initiated and consent given by thispatientand or family.   PLAN OF CARE and INTERVENTIONS:             1. GOALS OF CARE/ ADVANCE CARE PLANNING:  Goal is for patient to remain in her home.  She is a DNR from previous hospitalization. 2. SOCIAL/EMOTIONAL/SPIRITUAL ASSESSMENT/ INTERVENTIONS:  SW contacted patient to conduct a virtual check-in visit.  She stated she is recovering from a fall a few weeks ago, but is doing better.  She reports trying to get stronger.  Samantha Quinn confirmed that her daughter, Samantha Quinn, had talked to Dr. Quay Quinn about Samantha Quinn and agreed to the program.  Samantha Quinn would like to be present during the next visit, but is out of town until next week.  A home visit is scheduled for 1pm next Monday, September 14. 3. PATIENT/CAREGIVER EDUCATION/ COPING:  Patient copes by expressing herself openly. 4. PERSONAL EMERGENCY PLAN:  Family will contact EMS. 5. COMMUNITY RESOURCES COORDINATION/ HEALTH CARE NAVIGATION:  None. 6. FINANCIAL/LEGAL CONCERNS/INTERVENTIONS:  None identified by patient.     SOCIAL HX:  Social History   Tobacco Use  . Smoking status: Never Smoker  . Smokeless tobacco: Never Used  Substance Use Topics  . Alcohol use: No    Alcohol/week: 0.0 standard drinks    Comment: "rarely have a glass of wine"    CODE STATUS:  DNR  ADVANCED DIRECTIVES: N MOST FORM COMPLETE:  N HOSPICE EDUCATION PROVIDED: N Duration of visit and documentation:  30 minutes.      Samantha Quinn Samantha Bozarth, LCSW

## 2019-01-03 NOTE — Telephone Encounter (Signed)
Left voicemail for daughter with dr burns instructions, call back if any further questions

## 2019-01-03 NOTE — Telephone Encounter (Signed)
-----   Message from [name], MD sent at 01/01/2019  8:57 PM EDT ----- Call - No cancer cells in the fluid that was taken out from the lung!!!  Samantha Quinn

## 2019-01-04 ENCOUNTER — Other Ambulatory Visit: Payer: Self-pay

## 2019-01-05 ENCOUNTER — Encounter: Payer: Self-pay | Admitting: Physical Therapy

## 2019-01-05 ENCOUNTER — Ambulatory Visit: Payer: Medicare Other | Attending: Hematology & Oncology | Admitting: Physical Therapy

## 2019-01-05 ENCOUNTER — Other Ambulatory Visit: Payer: Self-pay

## 2019-01-05 VITALS — BP 122/62 | HR 79

## 2019-01-05 DIAGNOSIS — R262 Difficulty in walking, not elsewhere classified: Secondary | ICD-10-CM | POA: Diagnosis not present

## 2019-01-05 DIAGNOSIS — R2681 Unsteadiness on feet: Secondary | ICD-10-CM | POA: Diagnosis not present

## 2019-01-05 DIAGNOSIS — R42 Dizziness and giddiness: Secondary | ICD-10-CM | POA: Diagnosis not present

## 2019-01-05 NOTE — Therapy (Signed)
[facility] [address]  [place], [place], [address] Phone: [phone]   Fax:  [phone]  Physical Therapy Treatment  Patient Details  Name: Samantha Quinn MRN: [id number] Date of Birth: 01/30/1928 Referring Provider (PT): Burney Gauze, MD   Encounter Date: 01/05/2019  PT End of Session - 01/05/19 1654    Visit Number  5    Number of Visits  13    Date for PT Re-Evaluation  01/12/19    Authorization Type  Medicare & BCBS    PT Start Time  1608    PT Stop Time  1649    PT Time Calculation (min)  41 min    Activity Tolerance  Patient tolerated treatment well   limited by dizziness   Behavior During Therapy  [facility] for tasks assessed/performed       Past Medical History:  Diagnosis Date  . Anxiety   . Back pain    bulding disc  . Bronchitis    hx of-many,many years   . Diabetes mellitus   . Diabetes mellitus    type 2 and takes Metformin bid  . Dyslipidemia   . Embolism - blood clot    hx of in left lower leg and was on Coumadin for about 62months;this was about 8-81yrs ago  . Family history of adverse reaction to anesthesia    Daughter- Nausea  . Goals of care, counseling/discussion 11/12/2017  . History of colonic polyps   . History of radiation therapy 01/06/16-02/19/16   The primary tumor and involved mediastinal adenopathy were treated to 66 Gy in 33 fractions of 2 Gy.  [name] History of radiation therapy 04/01/17-04/26/17   cervical spine 35 Gy in 14 fractions  . HOH (hard of hearing)   . Hx of migraines    as a teenager  . Hypercholesterolemia   . Hyperlipidemia    takes Lovastatin every other day  . Hypertension    takes Ziac daily and Ramipril as well  . Hyperthyroidism   . Hypothyroidism    takes Synthroid every other day  . Insomnia    takes Elavil nightly  . Joint pain   . Lung mass   . Myalgia   . Osteoarthritis   . Osteoporosis   . Pneumothorax of right lung after biopsy 07/09/11   HAD CHEST TUBE PLACEMENT  . PONV (postoperative nausea and vomiting)   . Primary adenocarcinoma of lower lobe of right lung ([place]) 06/24/2011   pT2a, pN0 M0 Stage 1 Well differenced Adenocarcinoma 3.5 cm resected 07/17/2011  . Primary adenocarcinoma of lung ([place]) 06/24/2011  . Shortness of breath dyspnea    With exertion  . Skin cancer    legs have dark spots on them  . Upper respiratory infection 04/2011  . Urinary incontinence    takes Detrol daily prn    Past Surgical History:  Procedure Laterality Date  . ABDOMINAL HYSTERECTOMY    . ANTERIOR CERVICAL DECOMP/DISCECTOMY FUSION N/A 02/17/2017   Procedure: ANTERIOR CERVICAL DECOMPRESSION FUSION, CERVICAL 6-7 WITH INSTRUMENTATION AND ALLOGRAFT;  Surgeon: Phylliss Bob, MD;  Location: [facility];  Service: Orthopedics;  Laterality: N/A;  ANTERIOR CERVICAL DECOMPRESSION FUSION, CERVICAL 6-7 WITH INSTRUMENTATION AND ALLOGRAFT; REQUEST 2 HOURS AND FLIP   . APPENDECTOMY     as a child  . CHEST TUBE REMOVAL  07/13/11   RIGHT  CHEST TUBE  . CHOLECYSTECTOMY    . COLONOSCOPY    . COLONOSCOPY    . DILATION  AND CURETTAGE OF UTERUS     x 2  . EYE SURGERY Bilateral    Cataract  . HEMORRHOID SURGERY    . IR THORACENTESIS ASP PLEURAL SPACE W/IMG GUIDE  02/03/2018  . LUNG BIOPSY  07/09/11   RIGHT UPER LOBE LUNG MASS   . LUNG LOBECTOMY Right 06/2011  . POSTERIOR CERVICAL FUSION/FORAMINOTOMY N/A 02/18/2017   Procedure: CERVICAL Five-Six, CERVICAL Six-Seven, CERVICAL Seven-THORACIC One, THORACIC One-Two POSTERIOR SPINAL FUSION WITH INSTRUMENTATION AND ALLOGRAFT;  Surgeon: Phylliss Bob, MD;  Location: [facility];  Service: Orthopedics;  Laterality: N/A;  . RIGHT CHEST TUBE PLACEMENT  07/10/11   S/P LUNG BX  . ROTATOR CUFF REPAIR     bilateral  . TOE SURGERY  2013   right small toe  . TONSILLECTOMY     as a child  . VIDEO BRONCHOSCOPY  07/17/2011   Procedure: VIDEO BRONCHOSCOPY;  Surgeon: Grace Isaac, MD;  Location: [facility];  Service: Thoracic;   Laterality: N/A;  . VIDEO BRONCHOSCOPY WITH ENDOBRONCHIAL ULTRASOUND N/A 12/10/2015   Procedure: VIDEO BRONCHOSCOPY WITH ENDOBRONCHIAL ULTRASOUND with biopsies.;  Surgeon: Grace Isaac, MD;  Location: [facility];  Service: Thoracic;  Laterality: N/A;    Vitals:   01/05/19 1614  BP: 122/62  Pulse: 79  SpO2: 96%    Subjective Assessment - 01/05/19 1609    Subjective  She has been feeling better. The wooziness isn't all the way gone. last night she tried to sleep on 1 pillow but has some dizziness, so she chose to use 2. Had a thoracentesis which showed no CA cells. Feels about back to baseline. No dizziness or pain at the moment. Has been walking without her walker but has felt a littel woozy today.    Pertinent History  stage 4 lung CA with hx radiation, chemo, and R lobectomy, urinary incontinence, skin CA, SOB, osteoporosis, myalgia, hypothyroidism, HTN, HLD, migraines, HIH, hx of embolism, DM, back pain, R small toe surgery, B RTC repair, cervical fusion C5-6, C6-7, C7-T1, T1-2    Patient Stated Goals  work on my balance    Currently in Pain?  No/denies             Vestibular Assessment - 01/05/19 0001      Vestibular Assessment   General Observation         Positional Testing   Dix-Hallpike  Dix-Hallpike Left      Dix-Hallpike Left   Dix-Hallpike Left Duration  7 sec   2nd test lasted ~4 sec   Dix-Hallpike Left Symptoms  Upbeat, left rotatory nystagmus      Sidelying Left   Sidelying Left Duration  7 sec    Sidelying Left Symptoms  Upbeat, left rotatory nystagmus                Vestibular Treatment/Exercise - 01/05/19 0001      Vestibular Treatment/Exercise   Canalith Repositioning  Epley Manuever Left       EPLEY MANUEVER LEFT   Number of Reps   2    Overall Response   Improved Symptoms     RESPONSE DETAILS LEFT  very mild dizziness after 1st maneuver, felt better after 2nd            PT Education - 01/05/19 1653    Education Details  edu on  benefits and expected response to L sidelying test, L dix hallpike, and L epley manuever; educated on post-epley instructions    Person(s) Educated  Patient  Methods  Explanation;Demonstration;Tactile cues;Verbal cues;Handout    Comprehension  Verbalized understanding;Returned demonstration       PT Short Term Goals - 12/05/18 1802      PT SHORT TERM GOAL #1   Title  Patient to be independent with initial HEP.    Time  3    Period  Weeks    Status  On-going    Target Date  12/22/18        PT Long Term Goals - 12/20/18 1817      PT LONG TERM GOAL #1   Title  Patient to report 0/10 dizziness with R and L sidelying test.    Time  6    Period  Weeks    Status  On-going      PT LONG TERM GOAL #2   Title  Patient to demonstrate good stability, step length, and upright posture when ambulating with LRAD.    Time  6    Period  Weeks    Status  On-going      PT LONG TERM GOAL #3   Title  Patient to report no dizziness with bed mobility.    Time  6    Period  Weeks    Status  On-going      PT LONG TERM GOAL #4   Title  Patient to score >/=45/56 on Berg to decrease risk of falls.    Time  6    Period  Weeks    Status  On-going   44/56           Plan - 01/05/19 1656    Clinical Impression Statement  Patient returned to [facility] after R thoracentesis on 12/29/18. Reports that she feels that she is back to baseline after procedure, but still having some persisting "wooziness." Attempted to lay down on 1 pillow last night which did cause some dizziness, so she ended up using 2 pillows. Patient agreeable to treating L posterior canal today d/t observed nystagmus with L head turn during R Epley maneuver at last session. Tested L sidelying test, which was positive for dizziness and L upbeating torsional nystagmus. Proceeded with L Marye Round, which was also positive for L upbeating torsional nystagmus. Treated patient with L Epley maneuver. Patient reported mild dizziness after  maneuver. Allowed for sitting rest break, and dizziness resolved. Re-tested with L Marye Round, with nystagmus lasting a shorter duration. Patient reporting mild dizziness with R sidelying during 2nd Epley maneuver, which is a good prognostic indicator. Patient reported improved dizziness after second maneuver. Educated patient on post-Epley instructions. Patient reported understanding. Walked patient downstairs per her request, as she was unaccompanied to today's session.    Comorbidities  stage 4 lung CA with hx radiation, chemo, and R lobectomy, urinary incontinence, skin CA, SOB, osteoporosis, myalgia, hypothyroidism, HTN, HLD, migraines, HIH, hx of embolism, DM, back pain, R small toe surgery, B RTC repair, cervical fusion C5-6, C6-7, C7-T1, T1-2,    PT Treatment/Interventions  ADLs/Self Care Home Management;Canalith Repostioning;Cryotherapy;Moist Heat;Balance training;Therapeutic exercise;Therapeutic activities;Functional mobility training;Stair training;Gait training;Neuromuscular re-education;Patient/family education;Orthotic Fit/Training;Manual techniques;Vestibular;Taping;Energy conservation;Passive range of motion    PT Next Visit Plan  canalith repositioning    Consulted and Agree with Plan of Care  Patient;Family member/caregiver    Family Member Consulted  daughter       Patient will benefit from skilled therapeutic intervention in order to improve the following deficits and impairments:  Abnormal gait, Decreased activity tolerance, Decreased strength, Decreased balance, Decreased mobility, Difficulty walking, Improper body  mechanics, Decreased range of motion, Impaired flexibility, Postural dysfunction  Visit Diagnosis: Dizziness and giddiness  Unsteadiness on feet  Difficulty in walking, not elsewhere classified     Problem List Patient Active Problem List   Diagnosis Date Noted  . Dizziness 11/12/2018  . Subdural hematoma ([place]) 11/12/2018  . Intracranial bleed ([place])  11/09/2018  . Vitamin B 12 deficiency 03/10/2018  . Goals of care, counseling/discussion 11/12/2017  . IDA (iron deficiency anemia) 03/12/2017  . Radiculopathy 02/17/2017  . Lung cancer metastatic to bone ([place]) 02/05/2017  . Thyrotoxicosis without thyroid storm 01/18/2017  . Bilateral hearing loss 08/27/2016  . Toxic multinodular goiter w/o crisis 06/22/2016  . Macular degeneration 03/09/2016  . Encounter for antineoplastic chemotherapy 12/19/2015  . Meralgia paresthetica of left side 07/10/2015  . Osteoporosis 07/10/2015  . Onychomycosis 07/10/2015  . Diabetes ([place]) 07/10/2015  . Pneumothorax after biopsy 07/09/2011    Class: Acute  . Primary adenocarcinoma of lower lobe of right lung ([place]) 06/24/2011  . Hypercholesterolemia 10/20/2010  . Essential hypertension, benign 10/20/2010  . Osteoarthritis 10/20/2010    Janene Harvey, PT, DPT 01/05/19 5:59 PM   [facility] [address]  [place], [place], [address] Phone: [phone]   Fax:  [phone]  Name: EVONNE RINKS MRN: [id number] Date of Birth: Dec 22, 1927

## 2019-01-09 ENCOUNTER — Other Ambulatory Visit: Payer: Medicare Other | Admitting: Licensed Clinical Social Worker

## 2019-01-09 ENCOUNTER — Other Ambulatory Visit: Payer: Self-pay

## 2019-01-09 ENCOUNTER — Other Ambulatory Visit: Payer: Medicare Other | Admitting: *Deleted

## 2019-01-09 DIAGNOSIS — Z515 Encounter for palliative care: Secondary | ICD-10-CM

## 2019-01-09 NOTE — Progress Notes (Signed)
COMMUNITY PALLIATIVE CARE SW NOTE  PATIENT NAME: Samantha Quinn DOB: 12/16/1927 MRN: [id number]  PRIMARY CARE PROVIDER: Binnie Rail, MD  RESPONSIBLE PARTY:  Acct ID - Guarantor Home Phone Work Phone Relationship Acct Type  [id number] [name]  Self P/F     [place], [place], [place] [address]     PLAN OF CARE and INTERVENTIONS:             1. GOALS OF CARE/ ADVANCE CARE PLANNING:  Goal is for patient to remain in her home.  She is currently a full code. 2. SOCIAL/EMOTIONAL/SPIRITUAL ASSESSMENT/ INTERVENTIONS:  SW and Palliative Care RN, Samantha Quinn, met with patient, her daughter, Samantha Quinn, and her daughter-in-law, Samantha Quinn.  Patient expressed having back discomfort and does not like to take medications, but will if needed.  She has decreased energy and is not hungry very often.  Patient lives alone.  Samantha Quinn lives three miles away, her son and Samantha Quinn, live about twenty minutes away.  Patient's oldest son lives out of the area.  Patient was born and raised in [place], [place], as was her husband.  They lived in [place] for 45 years.  He died in 06-22-09 and she has been at her current address for four years.  Discussed safety issues.  Provided active listening and supportive counseling as patient's medical history was discussed.  Discussed patient's end of life wishes and she is not sure if she would like a DNR at this time. 3. PATIENT/CAREGIVER EDUCATION/ COPING:  Patient and her family express their feelings openly. 4. PERSONAL EMERGENCY PLAN:  Patient will sit and rest when she becomes tired. 5. COMMUNITY RESOURCES COORDINATION/ HEALTH CARE NAVIGATION:  None. 6. FINANCIAL/LEGAL CONCERNS/INTERVENTIONS:  None.     SOCIAL HX:  Social History   Tobacco Use  . Smoking status: Never Smoker  . Smokeless tobacco: Never Used  Substance Use Topics  . Alcohol use: No    Alcohol/week: 0.0 standard drinks    Comment: "rarely have a glass of wine"    CODE STATUS:  Full  code ADVANCED DIRECTIVES: LW, HCPOA MOST FORM COMPLETE:  No HOSPICE EDUCATION PROVIDED:  Yes PPS:  Patient reports her appetite is normal.  She ambulates independently. Duration of visit and documentation:  105 minutes.      Samantha Corn Samantha Ruland, LCSW

## 2019-01-09 NOTE — Progress Notes (Signed)
COMMUNITY PALLIATIVE CARE RN NOTE  PATIENT NAME: Samantha Quinn DOB: 03-03-1928 MRN: [id number]  PRIMARY CARE PROVIDER: Binnie Rail, MD  RESPONSIBLE PARTY:  Acct ID - Guarantor Home Phone Work Phone Relationship Acct Type  [id number] [name]  Self P/F     [address], [place], [place] [address]   Covid-19 Pre-screening Negative  PLAN OF CARE and INTERVENTION 1. ADVANCE CARE PLANNING/GOALS OF CARE: Goal is for patient to remain in her home and avoid hospitalizations. 2. PATIENT/CAREGIVER EDUCATION: Explained Palliative Care Services, Safe Mobility, DME recommendations 3. DISEASE STATUS:  Joint initial visit made with Palliative Care SW, Lynn Duffy. Met with patient and her two daughters, [name] and Enid Derry, in patient's home. Patient is alert and oriented and able to engage in appropriate conversation. She does have some mild word-finding difficulties at times. She reports mild pain in both shoulders and her back. She speaks of her history of previous radiation to a lesion in her lower spine, tumor removal in the back of her neck followed by a fusion almost a year ago, and a ruptured disk in her back. She is currently taking Tylenol for mild pain and 1/2 of a Hydrocodone 5/345m for moderate to severe pain. She feels this regimen has been effective. She reports having a fall in July, where she fell outside and hit the back of her head. She reports a history of vertigo, and the last thing remembered prior to her fall was feeling dizzy. She sustained a skull fracture and hematoma. Both have resolved. She reports that she has seen a PT about 4 times to perform Epley Maneuver and feels this has helped with her vertigo. She is ambulatory without assistive devices while in the home. She does keep her walker close by, especially during the night when feeling more unsteady and also when travelling outside of the home. She is going to speak with PT regarding a cane they feel would be best  for her. I recommended a shower chair and hand held sprayer for safety in the shower. She is able to bathe, dress and cook for herself. Her family also prepares meals and brings over to help. She also drives occasionally. She does not have much of an appetite, but does eat 3 meals/day. She also has protein supplements that she mixes with ice cream for added nutritional supplementation. She denies dysphagia. She does report becoming tired more easily. She had a thoracentesis 2 weeks ago in her R lower lobe and she says this has helped her breathing some. She is agreeable to receiving future visits with Palliative care. Will continue to monitor.                                                                                          HISTORY OF PRESENT ILLNESS: This is a 85yo female who lives alone. She has a very supportive family who visits and calls regularly. Palliative care team asked to follow patient for added support. Next visit scheduled for 01/24/19 at 1:00 pm.   CODE STATUS: Full Code ADVANCED DIRECTIVES: Y (Living Will, HCPOA) MOST FORM: no PPS: 60%   PHYSICAL  EXAM:   VITALS: Today's Vitals   01/09/19 2250  BP: 122/71  Pulse: 72  Resp: 18  Temp: (!) 97.3 F (36.3 C)  TempSrc: Temporal  SpO2: 97%  PainSc: 2   PainLoc: Shoulder    LUNGS: clear to auscultation  CARDIAC: Cor RRR EXTREMITIES: No edema SKIN: Exposed skin is dry and intact  NEURO: Alert and oriented x 3, pleasant mood, HOH, ambulatory   (Duration of visit and documentation 90 minutes)    Daryl Eastern, RN BSN

## 2019-01-10 ENCOUNTER — Other Ambulatory Visit: Payer: Self-pay

## 2019-01-11 DIAGNOSIS — H353132 Nonexudative age-related macular degeneration, bilateral, intermediate dry stage: Secondary | ICD-10-CM | POA: Diagnosis not present

## 2019-01-11 DIAGNOSIS — H35033 Hypertensive retinopathy, bilateral: Secondary | ICD-10-CM | POA: Diagnosis not present

## 2019-01-11 DIAGNOSIS — E113591 Type 2 diabetes mellitus with proliferative diabetic retinopathy without macular edema, right eye: Secondary | ICD-10-CM | POA: Diagnosis not present

## 2019-01-11 DIAGNOSIS — E113292 Type 2 diabetes mellitus with mild nonproliferative diabetic retinopathy without macular edema, left eye: Secondary | ICD-10-CM | POA: Diagnosis not present

## 2019-01-11 LAB — HM DIABETES EYE EXAM

## 2019-01-15 ENCOUNTER — Other Ambulatory Visit: Payer: Self-pay | Admitting: Internal Medicine

## 2019-01-18 ENCOUNTER — Encounter: Payer: Self-pay | Admitting: Internal Medicine

## 2019-01-24 ENCOUNTER — Other Ambulatory Visit: Payer: Medicare Other | Admitting: *Deleted

## 2019-01-24 ENCOUNTER — Other Ambulatory Visit: Payer: Medicare Other | Admitting: Licensed Clinical Social Worker

## 2019-01-24 ENCOUNTER — Other Ambulatory Visit: Payer: Self-pay

## 2019-01-24 DIAGNOSIS — Z515 Encounter for palliative care: Secondary | ICD-10-CM

## 2019-01-24 NOTE — Progress Notes (Signed)
COMMUNITY PALLIATIVE CARE SW NOTE  PATIENT NAME: Samantha Quinn DOB: 12/19/1927 MRN: [id number]  PRIMARY CARE PROVIDER: Binnie Rail, MD  RESPONSIBLE PARTY:  Acct ID - Guarantor Home Phone Work Phone Relationship Acct Type  [id number] [name]  Self P/F     [place], [place], [place] [address]     PLAN OF CARE and INTERVENTIONS:             1. GOALS OF CARE/ ADVANCE CARE PLANNING:  Patient's goal is to remain in her home and out of the hospital.  Patient is a full code. 2. SOCIAL/EMOTIONAL/SPIRITUAL ASSESSMENT/ INTERVENTIONS:  SW and Palliative Care RN, Daryl Eastern, met with patient in her home.  She was alert and oriented and denied pain.  Her daughter was not present because she was out of town.  Patient appeared to take great pride in displaying various art pieces that her family had made.  She said she has written her funeral service and has a brother-in-law who works at a funeral home where her arrangements are made.  She had not decided on her code status and wants more time to think about it.  SW provided active listening and supportive counseling. 3. PATIENT/CAREGIVER EDUCATION/ COPING:  Patient copes by expressing her feelings openly. 4. PERSONAL EMERGENCY PLAN:  Patient has a med-alert bracelet. 5. COMMUNITY RESOURCES COORDINATION/ HEALTH CARE NAVIGATION:  Physical Therapy. 6. FINANCIAL/LEGAL CONCERNS/INTERVENTIONS:  None.     SOCIAL HX:  Social History   Tobacco Use  . Smoking status: Never Smoker  . Smokeless tobacco: Never Used  Substance Use Topics  . Alcohol use: No    Alcohol/week: 0.0 standard drinks    Comment: "rarely have a glass of wine"    CODE STATUS:  Full Code ADVANCED DIRECTIVES:  LW, HCPOA MOST FORM COMPLETE:  No HOSPICE EDUCATION PROVIDED:  No PPS:  Patient reports she has to force herself to eat because she does not have an appetite.  She is able to ambulate independently. Duration of visit and documentation:  75  minutes.      Creola Corn Tema Alire, LCSW

## 2019-01-24 NOTE — Progress Notes (Signed)
COMMUNITY PALLIATIVE CARE RN NOTE  PATIENT NAME: Samantha Quinn DOB: 1928-02-17 MRN: [id number]  PRIMARY CARE PROVIDER: Binnie Rail, MD  RESPONSIBLE PARTY:  Acct ID - Guarantor Home Phone Work Phone Relationship Acct Type  [id number] [name]  Self P/F     [address] [place], [place] [address]   Covid-19 Pre-screening is Negative  PLAN OF CARE and INTERVENTION 1. ADVANCE CARE PLANNING/GOALS OF CARE: Goal is for patient to remain in her home. She is a Full code. 2. PATIENT/CAREGIVER EDUCATION: Safe Mobility and Pain Management 3. DISEASE STATUS: Joint visit made with Palliative Care SW, Lynn Duffy. Met with patient in her home. She is alert and oriented x 3, pleasant and engaging. She does have some mild word-finding difficulties. She reports that she feels that her hearing is worsening and she is going to make an appointment with an Audiologist to have her hearing checked and possible hearing aid adjustment. She does report some mild pain in her posterior neck and bilateral shoulders. She is taking Tylenol PRN and 1/2 tablet of Hydrocodone each night at bedtime. She does experience mild dyspnea with exertion. Occasional dry cough. She is able to perform her bathing, dressing, feeding and some meal preparation independently. She ambulates in the home without any assistive devices, but does have a walker available. She contacted her physical therapist after our last visit to inquire about using a cane. She was instructed to buy a single point cane and she will adjust the height at their next PT session on 01/27/19. She does drive occasionally. She reports that she has no stamina and requires frequent rest periods. Her daughter brought her a shower chair over, but she is going to have someone put in a hand held shower sprayer before utilizing it. She denies any recent dizzy spells or falls. She has an emergency alert pendant she wears on her wrist. She does take small naps during  the day and sleeps well at night. She says that she never has an appetite, but eats 3 meals/day because she knows she needs to. She denies dysphagia. Blood sugars have been stable in the 130s. She checks this every few days. She continues to receive Zometa infusions every 3-4 weeks. She took a recent 4-day trip to [place] with her daughter for her birthday and really enjoyed this. Will continue to monitor.  HISTORY OF PRESENT ILLNESS:  This is a 85 yo female who resides at home alone. She has a very supportive family who checks on her regularly. Palliative care team continues to follow patient. Next visit scheduled for 02/22/19 at 2:00 pm.  CODE STATUS: Full Code ADVANCED DIRECTIVES: Y MOST FORM: no PPS: 60%   PHYSICAL EXAM:   VITALS: Today's Vitals   01/24/19 1352  BP: 118/67  Pulse: 73  Resp: 18  Temp: (!) 97.2 F (36.2 C)  TempSrc: Temporal  SpO2: 98%  PainSc: 2   PainLoc: Shoulder    LUNGS: clear to auscultation  CARDIAC: Cor RRR EXTREMITIES: No edema SKIN: Exposed skin is dry and intact  NEURO: Alert and oriented x 3, HOH (bilateral hearing aides), generalized weakness, ambulatory   (Duration of visit and documentation 75 minutes)   Daryl Eastern, RN BSN

## 2019-01-25 ENCOUNTER — Other Ambulatory Visit: Payer: Self-pay

## 2019-01-26 ENCOUNTER — Inpatient Hospital Stay: Payer: Medicare Other

## 2019-01-26 ENCOUNTER — Encounter: Payer: Self-pay | Admitting: Hematology & Oncology

## 2019-01-26 ENCOUNTER — Inpatient Hospital Stay: Payer: Medicare Other | Attending: Hematology & Oncology | Admitting: Hematology & Oncology

## 2019-01-26 ENCOUNTER — Other Ambulatory Visit: Payer: Self-pay

## 2019-01-26 VITALS — BP 119/61 | HR 71 | Temp 97.5°F | Resp 19

## 2019-01-26 VITALS — BP 128/53 | HR 76 | Temp 97.5°F | Resp 19 | Wt 110.0 lb

## 2019-01-26 DIAGNOSIS — M549 Dorsalgia, unspecified: Secondary | ICD-10-CM | POA: Insufficient documentation

## 2019-01-26 DIAGNOSIS — C3431 Malignant neoplasm of lower lobe, right bronchus or lung: Secondary | ICD-10-CM

## 2019-01-26 DIAGNOSIS — M542 Cervicalgia: Secondary | ICD-10-CM | POA: Diagnosis not present

## 2019-01-26 DIAGNOSIS — C7951 Secondary malignant neoplasm of bone: Secondary | ICD-10-CM | POA: Insufficient documentation

## 2019-01-26 DIAGNOSIS — Z885 Allergy status to narcotic agent status: Secondary | ICD-10-CM | POA: Diagnosis not present

## 2019-01-26 DIAGNOSIS — D508 Other iron deficiency anemias: Secondary | ICD-10-CM

## 2019-01-26 DIAGNOSIS — Z888 Allergy status to other drugs, medicaments and biological substances status: Secondary | ICD-10-CM | POA: Diagnosis not present

## 2019-01-26 DIAGNOSIS — G542 Cervical root disorders, not elsewhere classified: Secondary | ICD-10-CM | POA: Insufficient documentation

## 2019-01-26 DIAGNOSIS — Z86711 Personal history of pulmonary embolism: Secondary | ICD-10-CM | POA: Insufficient documentation

## 2019-01-26 DIAGNOSIS — Z923 Personal history of irradiation: Secondary | ICD-10-CM | POA: Diagnosis not present

## 2019-01-26 DIAGNOSIS — Z23 Encounter for immunization: Secondary | ICD-10-CM

## 2019-01-26 DIAGNOSIS — C3411 Malignant neoplasm of upper lobe, right bronchus or lung: Secondary | ICD-10-CM | POA: Insufficient documentation

## 2019-01-26 LAB — CBC WITH DIFFERENTIAL (CANCER CENTER ONLY)
Abs Immature Granulocytes: 0.07 10*3/uL (ref 0.00–0.07)
Basophils Absolute: 0 10*3/uL (ref 0.0–0.1)
Basophils Relative: 0 %
Eosinophils Absolute: 0.1 10*3/uL (ref 0.0–0.5)
Eosinophils Relative: 1 %
HCT: 34.8 % — ABNORMAL LOW (ref 36.0–46.0)
Hemoglobin: 11 g/dL — ABNORMAL LOW (ref 12.0–15.0)
Immature Granulocytes: 1 %
Lymphocytes Relative: 6 %
Lymphs Abs: 0.5 10*3/uL — ABNORMAL LOW (ref 0.7–4.0)
MCH: 28.9 pg (ref 26.0–34.0)
MCHC: 31.6 g/dL (ref 30.0–36.0)
MCV: 91.6 fL (ref 80.0–100.0)
Monocytes Absolute: 0.5 10*3/uL (ref 0.1–1.0)
Monocytes Relative: 6 %
Neutro Abs: 7 10*3/uL (ref 1.7–7.7)
Neutrophils Relative %: 86 %
Platelet Count: 205 10*3/uL (ref 150–400)
RBC: 3.8 MIL/uL — ABNORMAL LOW (ref 3.87–5.11)
RDW: 14.2 % (ref 11.5–15.5)
WBC Count: 8.1 10*3/uL (ref 4.0–10.5)
nRBC: 0 % (ref 0.0–0.2)

## 2019-01-26 LAB — CMP (CANCER CENTER ONLY)
ALT: 6 U/L (ref 0–44)
AST: 15 U/L (ref 15–41)
Albumin: 4.4 g/dL (ref 3.5–5.0)
Alkaline Phosphatase: 225 U/L — ABNORMAL HIGH (ref 38–126)
Anion gap: 11 (ref 5–15)
BUN: 17 mg/dL (ref 8–23)
CO2: 27 mmol/L (ref 22–32)
Calcium: 9.5 mg/dL (ref 8.9–10.3)
Chloride: 102 mmol/L (ref 98–111)
Creatinine: 0.78 mg/dL (ref 0.44–1.00)
GFR, Est AFR Am: >60 mL/min (ref >60)
GFR, Estimated: >60 mL/min (ref >60)
Glucose, Bld: 157 mg/dL — ABNORMAL HIGH (ref 70–99)
Potassium: 4.1 mmol/L (ref 3.5–5.1)
Sodium: 140 mmol/L (ref 135–145)
Total Bilirubin: 0.4 mg/dL (ref 0.3–1.2)
Total Protein: 7.1 g/dL (ref 6.5–8.1)

## 2019-01-26 MED ORDER — INFLUENZA VAC SPLIT QUAD 0.5 ML IM SUSY
0.5000 mL | PREFILLED_SYRINGE | Freq: Once | INTRAMUSCULAR | Status: AC
  Administered 2019-01-26: 0.5 mL via INTRAMUSCULAR

## 2019-01-26 MED ORDER — SODIUM CHLORIDE 0.9 % IV SOLN
Freq: Once | INTRAVENOUS | Status: AC
  Administered 2019-01-26: 14:00:00 via INTRAVENOUS
  Filled 2019-01-26: qty 250

## 2019-01-26 MED ORDER — SODIUM CHLORIDE 0.9% FLUSH
10.0000 mL | INTRAVENOUS | Status: DC | PRN
  Filled 2019-01-26: qty 10

## 2019-01-26 MED ORDER — INFLUENZA VAC SPLIT QUAD 0.5 ML IM SUSY
PREFILLED_SYRINGE | INTRAMUSCULAR | Status: AC
  Filled 2019-01-26: qty 0.5

## 2019-01-26 MED ORDER — ZOLEDRONIC ACID 4 MG/5ML IV CONC
3.0000 mg | Freq: Once | INTRAVENOUS | Status: DC
  Filled 2019-01-26: qty 3.75

## 2019-01-26 MED ORDER — ZOLEDRONIC ACID 4 MG/5ML IV CONC
3.0000 mg | Freq: Once | INTRAVENOUS | Status: AC
  Administered 2019-01-26: 15:00:00 3 mg via INTRAVENOUS
  Filled 2019-01-26: qty 3.75

## 2019-01-26 MED ORDER — SODIUM CHLORIDE 0.9% FLUSH
3.0000 mL | Freq: Once | INTRAVENOUS | Status: DC | PRN
  Filled 2019-01-26: qty 10

## 2019-01-26 NOTE — Progress Notes (Signed)
Hematology and Oncology Follow Up Visit  Samantha Quinn [id number] 26-Sep-1927 85 y.o. 01/26/2019   Principle Diagnosis:  Stage IV adenocarcinoma of the right lung-mutation negative C6-7 left nerve root compression secondary to metastatic disease - PD-L1 (0%) /TMB of 9  Past Therapy: Status post radiation/chemotherapy for recurrence-completed October 2017 Status post right upper lobectomy in March 2013 for stage IB disease XRT to the cervical spine - started 04/02/2017  Tecentriq 1200 mg IV every 3 weeks s/p cycle 8 -- d/c progression  Current Therapy:   Alimta/Avastin s/p cycle #5 -- d/c on 05/06/2018 Zometa 4 mg IV q4 weeks -next dose on  November 2020    Interim History:  Samantha Quinn is here today for follow-up.  Overall, I would have to say that she is doing fairly well.  She had a wonderful birthday weekend.  She was at the beach with her family.  She really enjoyed this.  She really has no complaints although her arthritis is bothering her.  She has a lot of neck issues.  She has had surgery and radiation to the neck because of past cancer.  Her appetite is doing okay.  She seems to be eating okay.  She is having no nausea or vomiting.  I am happy that her weight is holding stable.  She has had no issues with fever.  She has had no increased shortness of breath.  She has had a pleural effusion in the past.  Thankfully I do not think this is a problem right now.  She has had no bleeding.  There is been no leg swelling.  Overall, I would say her performance status is ECOG 2-3.     Medications:    Allergies:  Allergies  Allergen Reactions  . Ramipril Other (See Comments)    Tongue swelling  . Tramadol Other (See Comments)    syncope syncope  . Codeine Nausea And Vomiting  . Demerol Nausea Only    Past Medical History, Surgical history, Social history, and Family History were reviewed and updated.  Review of Systems: Review of Systems  Constitutional:  Negative.   HENT: Negative.   Eyes: Negative.   Respiratory: Negative.   Cardiovascular: Negative.   Gastrointestinal: Negative.   Genitourinary: Negative.   Musculoskeletal: Positive for back pain and neck pain.  Skin: Negative.   Neurological: Negative.   Endo/Heme/Allergies: Negative.   Psychiatric/Behavioral: Negative.      Physical Exam:  weight is 110 lb (49.9 kg). Her temporal temperature is 97.5 F (36.4 C) (abnormal). Her blood pressure is 128/53 (abnormal) and her pulse is 76. Her respiration is 19 and oxygen saturation is 97%.   Wt Readings from Last 3 Encounters:  01/26/19 110 lb (49.9 kg)  12/22/18 111 lb (50.3 kg)  12/21/18 111 lb (50.3 kg)    Physical Exam Vitals signs reviewed.  HENT:     Head: Normocephalic and atraumatic.  Eyes:     Pupils: Pupils are equal, round, and reactive to light.  Neck:     Musculoskeletal: Normal range of motion.  Cardiovascular:     Rate and Rhythm: Normal rate and regular rhythm.     Heart sounds: Normal heart sounds.  Pulmonary:     Effort: Pulmonary effort is normal.     Breath sounds: Normal breath sounds.  Abdominal:     General: Bowel sounds are normal.     Palpations: Abdomen is soft.  Musculoskeletal: Normal range of motion.        General:  No tenderness or deformity.  Lymphadenopathy:     Cervical: No cervical adenopathy.  Skin:    General: Skin is warm and dry.     Findings: No erythema or rash.  Neurological:     Mental Status: She is alert and oriented to person, place, and time.  Psychiatric:        Behavior: Behavior normal.        Thought Content: Thought content normal.        Judgment: Judgment normal.      Lab Results  Component Value Date   WBC 8.1 01/26/2019   HGB 11.0 (L) 01/26/2019   HCT 34.8 (L) 01/26/2019   MCV 91.6 01/26/2019   PLT 205 01/26/2019   Lab Results  Component Value Date   FERRITIN 782 (H) 12/22/2018   IRON 57 11/24/2018   TIBC 273 11/24/2018   UIBC 216 11/24/2018    IRONPCTSAT 21 11/24/2018   Lab Results  Component Value Date   RETICCTPCT 2.1 05/06/2018   RBC 3.80 (L) 01/26/2019   No results found for: KPAFRELGTCHN, LAMBDASER, KAPLAMBRATIO No results found for: IGGSERUM, IGA, IGMSERUM No results found for: Odetta Pink, SPEI   Chemistry      Component Value Date/Time   NA 140 01/26/2019 1112   NA 144 04/07/2017 1215   NA 135 (L) 03/11/2017 1416   K 4.1 01/26/2019 1112   K 4.0 04/07/2017 1215   K 3.9 03/11/2017 1416   CL 102 01/26/2019 1112   CL 103 04/07/2017 1215   CO2 27 01/26/2019 1112   CO2 29 04/07/2017 1215   CO2 23 03/11/2017 1416   BUN 17 01/26/2019 1112   BUN 13 04/07/2017 1215   BUN 16.1 03/11/2017 1416   CREATININE 0.78 01/26/2019 1112   CREATININE 0.8 04/07/2017 1215   CREATININE 0.9 03/11/2017 1416      Component Value Date/Time   CALCIUM 9.5 01/26/2019 1112   CALCIUM 9.6 04/07/2017 1215   CALCIUM 9.6 03/11/2017 1416   ALKPHOS 225 (H) 01/26/2019 1112   ALKPHOS 60 04/07/2017 1215   ALKPHOS 79 03/11/2017 1416   AST 15 01/26/2019 1112   AST 11 03/11/2017 1416   ALT 6 01/26/2019 1112   ALT 15 04/07/2017 1215   ALT 11 03/11/2017 1416   BILITOT 0.4 01/26/2019 1112   BILITOT 0.26 03/11/2017 1416      Impression and Plan: Samantha Quinn is a very pleasant 85 yo caucasian female with progressive metastatic non small cell adenocarcinoma of the lung.  I am glad that her overall quality of life is doing little bit better.  Overall, I have to say that her cancer probably is slowly progressing.  The fact that her weight is stable is encouraging.  I know that her alkaline phosphatase is on the higher side.  This I think is probably an indicator of her malignancy.  She will get her Zometa today.  I will then have her come back in about 4 or 5 weeks for her next visit.  I do not think we have to do another scan on her probably until December.[name], MD  10/1/20201:28 PM

## 2019-01-26 NOTE — Patient Instructions (Addendum)
Zoledronic Acid injection (Hypercalcemia, Oncology) What is this medicine? ZOLEDRONIC ACID (ZOE le dron ik AS id) lowers the amount of calcium loss from bone. It is used to treat too much calcium in your blood from cancer. It is also used to prevent complications of cancer that has spread to the bone. This medicine may be used for other purposes; ask your health care provider or pharmacist if you have questions. COMMON BRAND NAME(S): Zometa What should I tell my health care provider before I take this medicine? They need to know if you have any of these conditions:  aspirin-sensitive asthma  cancer, especially if you are receiving medicines used to treat cancer  dental disease or wear dentures  infection  kidney disease  receiving corticosteroids like dexamethasone or prednisone  an unusual or allergic reaction to zoledronic acid, other medicines, foods, dyes, or preservatives  pregnant or trying to get pregnant  breast-feeding How should I use this medicine? This medicine is for infusion into a vein. It is given by a health care professional in a hospital or clinic setting. Talk to your pediatrician regarding the use of this medicine in children. Special care may be needed. Overdosage: If you think you have taken too much of this medicine contact a poison control center or emergency room at once. NOTE: This medicine is only for you. Do not share this medicine with others. What if I miss a dose? It is important not to miss your dose. Call your doctor or health care professional if you are unable to keep an appointment. What may interact with this medicine?  certain antibiotics given by injection  NSAIDs, medicines for pain and inflammation, like ibuprofen or naproxen  some diuretics like bumetanide, furosemide  teriparatide  thalidomide This list may not describe all possible interactions. Give your health care provider a list of all the medicines, herbs, non-prescription  drugs, or dietary supplements you use. Also tell them if you smoke, drink alcohol, or use illegal drugs. Some items may interact with your medicine. What should I watch for while using this medicine? Visit your doctor or health care professional for regular checkups. It may be some time before you see the benefit from this medicine. Do not stop taking your medicine unless your doctor tells you to. Your doctor may order blood tests or other tests to see how you are doing. Women should inform their doctor if they wish to become pregnant or think they might be pregnant. There is a potential for serious side effects to an unborn child. Talk to your health care professional or pharmacist for more information. You should make sure that you get enough calcium and vitamin D while you are taking this medicine. Discuss the foods you eat and the vitamins you take with your health care professional. Some people who take this medicine have severe bone, joint, and/or muscle pain. This medicine may also increase your risk for jaw problems or a broken thigh bone. Tell your doctor right away if you have severe pain in your jaw, bones, joints, or muscles. Tell your doctor if you have any pain that does not go away or that gets worse. Tell your dentist and dental surgeon that you are taking this medicine. You should not have major dental surgery while on this medicine. See your dentist to have a dental exam and fix any dental problems before starting this medicine. Take good care of your teeth while on this medicine. Make sure you see your dentist for regular follow-up   appointments. What side effects may I notice from receiving this medicine? Side effects that you should report to your doctor or health care professional as soon as possible:  allergic reactions like skin rash, itching or hives, swelling of the face, lips, or tongue  anxiety, confusion, or depression  breathing problems  changes in vision  eye pain   feeling faint or lightheaded, falls  jaw pain, especially after dental work  mouth sores  muscle cramps, stiffness, or weakness  redness, blistering, peeling or loosening of the skin, including inside the mouth  trouble passing urine or change in the amount of urine Side effects that usually do not require medical attention (report to your doctor or health care professional if they continue or are bothersome):  bone, joint, or muscle pain  constipation  diarrhea  fever  hair loss  irritation at site where injected  loss of appetite  nausea, vomiting  stomach upset  trouble sleeping  trouble swallowing  weak or tired This list may not describe all possible side effects. Call your doctor for medical advice about side effects. You may report side effects to FDA at 1-800-FDA-1088. Where should I keep my medicine? This drug is given in a hospital or clinic and will not be stored at home. NOTE: This sheet is a summary. It may not cover all possible information. If you have questions about this medicine, talk to your doctor, pharmacist, or health care provider.  2020 Elsevier/Gold Standard (2013-09-09 14:19:39) Preventing Influenza, Adult Influenza, more commonly known as "the flu," is a viral infection that mainly affects the respiratory tract. The respiratory tract includes structures that help you breathe, such as the lungs, nose, and throat. The flu causes many common cold symptoms, as well as a high fever and body aches. The flu spreads easily from person to person (is contagious). The flu is most common from December through March. This is called flu season.You can catch the flu virus by:  Breathing in droplets from an infected person's cough or sneeze.  Touching something that was recently contaminated with the virus and then touching your mouth, nose, or eyes. What can I do to lower my risk?        You can decrease your risk of getting the flu by:  Getting a  flu shot (influenza vaccination) every year. This is the best way to prevent the flu. A flu shot is recommended for everyone age 6 months and older. ? It is best to get a flu shot in the fall, as soon as it is available. Getting a flu shot during winter or spring instead is still a good idea. Flu season can last into early spring. ? Preventing the flu through vaccination requires getting a new flu shot every year. This is because the flu virus changes slightly (mutates) from one year to the next. Even if a flu shot does not completely protect you from all flu virus mutations, it can reduce the severity of your illness and prevent dangerous complications of the flu. ? If you are pregnant, you can and should get a flu shot. ? If you have had a reaction to the shot in the past or if you are allergic to eggs, check with your health care provider before getting a flu shot. ? Sometimes the vaccine is available as a nasal spray. In some years, the nasal spray has not been as effective against the flu virus. Check with your health care provider if you have questions about this.    Practicing good health habits. This is especially important during flu season. ? Avoid contact with people who are sick with flu or cold symptoms. ? Wash your hands with soap and water often. If soap and water are not available, use alcohol-based hand sanitizer. ? Avoid touching your hands to your face, especially when you have not washed your hands recently. ? Use a disinfectant to clean surfaces at home and at work that may be contaminated with the flu virus. ? Keep your body's disease-fighting system (immune system) in good shape by eating a healthy diet, drinking plenty of fluids, getting enough sleep, and exercising regularly. If you do get the flu, avoid spreading it to others by:  Staying home until your symptoms have been gone for at least one day.  Covering your mouth and nose when you cough or sneeze.  Avoiding close  contact with others, especially babies and elderly people. Why are these changes important? Getting a flu shot and practicing good health habits protects you as well as other people. If you get the flu, your friends, family, and co-workers are also at risk of getting it, because it spreads so easily to others. Each year, about 2 out of every 10 people get the flu. Having the flu can lead to complications, such as pneumonia, ear infection, and sinus infection. The flu also can be deadly, especially for babies, people older than age 65, and people who have serious long-term diseases. How is this treated? Most people recover from the flu by resting at home and drinking plenty of fluids. However, a prescription antiviral medicine may reduce your flu symptoms and may make your flu go away sooner. This medicine must be started within a few days of getting flu symptoms. You can talk with your health care provider about whether you need an antiviral medicine. Antiviral medicine may be prescribed for people who are at risk for more serious flu symptoms. This includes people who:  Are older than age 65.  Are pregnant.  Have a condition that makes the flu worse or more dangerous. Where to find more information  Centers for Disease Control and Prevention: www.cdc.gov/flu/index.htm  Flu.gov: www.flu.gov/prevention-vaccination  American Academy [employer]: familydoctor.org/familydoctor/en/kids/vaccines/preventing-the-flu.html Contact a health care provider if:  You have influenza and you develop new symptoms.  You have: ? Chest pain. ? Diarrhea. ? A fever.  Your cough gets worse, or you produce more mucus. Summary  The best way to prevent the flu is to get a flu shot every year in the fall.  Even if you get the flu after you have received the yearly vaccine, your flu may be milder and go away sooner because of your flu shot.  If you get the flu, antiviral medicines that are started  with a few days of symptoms may reduce your flu symptoms and may make your flu go away sooner.  You can also help prevent the flu by practicing good health habits. This information is not intended to replace advice given to you by your health care provider. Make sure you discuss any questions you have with your health care provider. Document Released: 04/28/2015 Document Revised: 03/26/2017 Document Reviewed: 12/21/2015 Elsevier Patient Education  2020 [employer]  

## 2019-01-27 ENCOUNTER — Ambulatory Visit: Payer: Medicare Other | Attending: Hematology & Oncology | Admitting: Physical Therapy

## 2019-01-27 ENCOUNTER — Encounter: Payer: Self-pay | Admitting: Physical Therapy

## 2019-01-27 DIAGNOSIS — R262 Difficulty in walking, not elsewhere classified: Secondary | ICD-10-CM | POA: Diagnosis not present

## 2019-01-27 DIAGNOSIS — R42 Dizziness and giddiness: Secondary | ICD-10-CM | POA: Diagnosis not present

## 2019-01-27 DIAGNOSIS — R2681 Unsteadiness on feet: Secondary | ICD-10-CM | POA: Insufficient documentation

## 2019-01-27 NOTE — Therapy (Signed)
[facility] [address]  [place], [place], [address] Phone: [phone]   Fax:  [phone]  Physical Therapy Treatment  Patient Details  Name: Samantha Quinn MRN: [id number] Date of Birth: January 18, 1928 Referring Provider (PT): Burney Gauze, MD   Encounter Date: 01/27/2019  PT End of Session - 01/27/19 1134    Visit Number  6    Number of Visits  10    Date for PT Re-Evaluation  03/03/19    Authorization Type  Medicare & BCBS    PT Start Time  0758    PT Stop Time  0846    PT Time Calculation (min)  48 min    Activity Tolerance  Patient tolerated treatment well   limited by dizziness   Behavior During Therapy  [facility] for tasks assessed/performed       Past Medical History:  Diagnosis Date  . Anxiety   . Back pain    bulding disc  . Bronchitis    hx of-many,many years   . Diabetes mellitus   . Diabetes mellitus    type 2 and takes Metformin bid  . Dyslipidemia   . Embolism - blood clot    hx of in left lower leg and was on Coumadin for about 85month;this was about 8-138yrago  . Family history of adverse reaction to anesthesia    Daughter- Nausea  . Goals of care, counseling/discussion 11/12/2017  . History of colonic polyps   . History of radiation therapy 01/06/16-02/19/16   The primary tumor and involved mediastinal adenopathy were treated to 66 Gy in 33 fractions of 2 Gy.  . Marland Kitchenistory of radiation therapy 04/01/17-04/26/17   cervical spine 35 Gy in 14 fractions  . HOH (hard of hearing)   . Hx of migraines    as a teenager  . Hypercholesterolemia   . Hyperlipidemia    takes Lovastatin every other day  . Hypertension    takes Ziac daily and Ramipril as well  . Hyperthyroidism   . Hypothyroidism    takes Synthroid every other day  . Insomnia    takes Elavil nightly  . Joint pain   . Lung mass   . Myalgia   . Osteoarthritis   . Osteoporosis   . Pneumothorax of right lung after biopsy 07/09/11   HAD CHEST TUBE PLACEMENT  . PONV (postoperative nausea and vomiting)   . Primary adenocarcinoma of lower lobe of right lung ([facility]   pT2a, pN0 M0 Stage 1 Well differenced Adenocarcinoma 3.5 cm resected 07/17/2011  . Primary adenocarcinoma of lung ([facility]/2013  . Shortness of breath dyspnea    With exertion  . Skin cancer    legs have dark spots on them  . Upper respiratory infection 04/2011  . Urinary incontinence    takes Detrol daily prn    Past Surgical History:  Procedure Laterality Date  . ABDOMINAL HYSTERECTOMY    . ANTERIOR CERVICAL DECOMP/DISCECTOMY FUSION N/A 02/17/2017   Procedure: ANTERIOR CERVICAL DECOMPRESSION FUSION, CERVICAL 6-7 WITH INSTRUMENTATION AND ALLOGRAFT;  Surgeon: DuPhylliss BobMD;  [facility] Service: Orthopedics;  Laterality: N/A;  ANTERIOR CERVICAL DECOMPRESSION FUSION, CERVICAL 6-7 WITH INSTRUMENTATION AND ALLOGRAFT; REQUEST 2 HOURS AND FLIP   . APPENDECTOMY     as a child  . CHEST TUBE REMOVAL  07/13/11   RIGHT  CHEST TUBE  . CHOLECYSTECTOMY    . COLONOSCOPY    . COLONOSCOPY    . DILATION  AND CURETTAGE OF UTERUS     x 2  . EYE SURGERY Bilateral    Cataract  . HEMORRHOID SURGERY    . IR THORACENTESIS ASP PLEURAL SPACE W/IMG GUIDE  02/03/2018  . LUNG BIOPSY  07/09/11   RIGHT UPER LOBE LUNG MASS   . LUNG LOBECTOMY Right 06/2011  . POSTERIOR CERVICAL FUSION/FORAMINOTOMY N/A 02/18/2017   Procedure: CERVICAL Five-Six, CERVICAL Six-Seven, CERVICAL Seven-THORACIC One, THORACIC One-Two POSTERIOR SPINAL FUSION WITH INSTRUMENTATION AND ALLOGRAFT;  Surgeon: Phylliss Bob, MD;  Location: [facility];  Service: Orthopedics;  Laterality: N/A;  . RIGHT CHEST TUBE PLACEMENT  07/10/11   S/P LUNG BX  . ROTATOR CUFF REPAIR     bilateral  . TOE SURGERY  2013   right small toe  . TONSILLECTOMY     as a child  . VIDEO BRONCHOSCOPY  07/17/2011   Procedure: VIDEO BRONCHOSCOPY;  Surgeon: Grace Isaac, MD;  Location: [facility];  Service: Thoracic;   Laterality: N/A;  . VIDEO BRONCHOSCOPY WITH ENDOBRONCHIAL ULTRASOUND N/A 12/10/2015   Procedure: VIDEO BRONCHOSCOPY WITH ENDOBRONCHIAL ULTRASOUND with biopsies.;  Surgeon: Grace Isaac, MD;  Location: [facility];  Service: Thoracic;  Laterality: N/A;    There were no vitals filed for this visit.  Subjective Assessment - 01/27/19 0759    Subjective  Feels like her dizziness is pretty much gone. Reports 90% improvement in dizziness. Is able to bend over to pick something up from the floor, but every once in a while she feels a little bit. Would like to continue working on balance and strength.    Pertinent History  stage 4 lung CA with hx radiation, chemo, and R lobectomy, urinary incontinence, skin CA, SOB, osteoporosis, myalgia, hypothyroidism, HTN, HLD, migraines, HIH, hx of embolism, DM, back pain, R small toe surgery, B RTC repair, cervical fusion C5-6, C6-7, C7-T1, T1-2    Patient Stated Goals  work on my balance    Currently in Pain?  Yes    Pain Score  3     Pain Location  Shoulder    Pain Orientation  Right;Left;Upper    Pain Descriptors / Indicators  Sore    Pain Type  Chronic pain         [facility] PT Assessment - 01/27/19 0001      Assessment   Medical Diagnosis  Primary adenocarcinoma of lower lobe of R lung    Referring Provider (PT)  Burney Gauze, MD    Onset Date/Surgical Date  11/09/18      Merrilee Jansky Balance Test   Sit to Stand  Able to stand without using hands and stabilize independently    Standing Unsupported  Able to stand safely 2 minutes    Sitting with Back Unsupported but Feet Supported on Floor or Stool  Able to sit safely and securely 2 minutes    Stand to Sit  Sits safely with minimal use of hands    Transfers  Able to transfer safely, minor use of hands    Standing Unsupported with Eyes Closed  Able to stand 10 seconds safely    Standing Unsupported with Feet Together  Able to place feet together independently and stand 1 minute safely    From Standing, Reach  Forward with Outstretched Arm  Can reach forward >12 cm safely (5")    From Standing Position, Pick up Object from Floor  Able to pick up shoe safely and easily    From Standing Position, Turn to Look Behind Over each Shoulder  Looks behind from both sides and weight shifts well   limited by cervical movement   Turn 360 Degrees  Able to turn 360 degrees safely in 4 seconds or less    Standing Unsupported, Alternately Place Feet on Step/Stool  Able to stand independently and safely and complete 8 steps in 20 seconds    Standing Unsupported, One Foot in Front  Able to plae foot ahead of the other independently and hold 30 seconds    Standing on One Leg  Able to lift leg independently and hold equal to or more than 3 seconds    Total Score  52                   [facility] Adult PT Treatment/Exercise - 01/27/19 0001      Ambulation/Gait   Ambulation Distance (Feet)  200 Feet    Assistive device  None;Straight cane    Gait Pattern  Step-to pattern;Step-through pattern;Decreased step length - right;Decreased step length - left;Trunk flexed;Narrow base of support;Poor foot clearance - right;Poor foot clearance - left    Ambulation Surface  Level;Indoor    Gait velocity  decreased    Gait Comments  gait training with and without SPC- required demonstration and cues on cane sequencing; pt demonstrated good stability but requiring cues to avoid placing cane too far in front      Knee/Hip Exercises: Standing   Heel Raises  Both;1 set;10 reps    Heel Raises Limitations  B UEs on chair    Hip Flexion  Stengthening;1 set;10 reps;Knee bent    Hip Flexion Limitations  resisted marching with yellow TB at chair      Knee/Hip Exercises: Seated   Sit to Sand  1 set;5 reps;with UE support;without UE support   3x without UEs and with min A; 2x with 1 UE support            PT Education - 01/27/19 1133    Education Details  review of HEP and edu on importance of maintaining a fitness regimen;  discussion on objective progress; edu on SPC cane type and use    Person(s) Educated  Patient    Methods  Explanation;Demonstration;Tactile cues;Verbal cues    Comprehension  Verbalized understanding;Returned demonstration       PT Short Term Goals - 01/27/19 0804      PT SHORT TERM GOAL #1   Title  Patient to be independent with initial HEP.    Time  3    Period  Weeks    Status  On-going   reports intermittent compliance   Target Date  02/17/19        PT Long Term Goals - 01/27/19 0805      PT LONG TERM GOAL #1   Title  Patient to report 0/10 dizziness with R and L sidelying test.    Time  5    Period  Weeks    Status  Deferred   NT today per patient's request as she drove herself to the appointment   Target Date  03/03/19      PT LONG TERM GOAL #2   Title  Patient to demonstrate good stability, step length, and upright posture when ambulating with LRAD.    Time  5    Period  Weeks    Status  Partially Met   improved stability, still demonstrating decreased speed and kyphotic posture   Target Date  03/03/19      PT LONG TERM GOAL #  3   Title  Patient to report no dizziness with bed mobility.    Time  6    Period  Weeks    Status  Achieved      PT LONG TERM GOAL #4   Title  Patient to score >/=45/56 on Berg to decrease risk of falls.    Time  6    Period  Weeks    Status  Achieved   52/56     PT LONG TERM GOAL #5   Title  Patient to score <14 sec on TUG with LRAD in order to decrease risk of falls.    Time  5    Status  New    Target Date  03/03/19      Additional Long Term Goals   Additional Long Term Goals  Yes      PT LONG TERM GOAL #6   Title  Patient to score < 15 sec on 5xSTS without UE support in order to decrease risk of falls.    Time  5    Period  Weeks    Status  New    Target Date  03/03/19            Plan - 01/27/19 1145    Clinical Impression Statement  Patient reporting 90% resolution of her dizziness. Able to bend forward to  pick objects off the ground and is no longer using her walker. Notes that every once in a while the dizziness returns, but is not associated with a certain activity. Reporting no dizziness remaining with bed mobility, meeting this goal. Patient is showing improved stability with ambulation- still demonstrating decreased speed and kyphotic posturing. Educated patient on [facility] use as she brought in one of her own, with patient catching onto cane sequencing well. Scored 52/56 on Berg, meeting this goal and indicating decreased fall risk. Patient reporting that she would like to continue with PT to work on balance and strengthening. Admits to only intermittent compliance with HEP, thus educated patient on importance of increased compliance for max benefit and reviewed HEP. Updated goals to continue addressing impairments that patient demonstrates, such as difficulty with STS transfers and decreased gait speed. Patient is demonstrating great progress, would benefit from continued skilled PT services 1x/week for 4 weeks to address remaining goals.    Comorbidities  stage 4 lung CA with hx radiation, chemo, and R lobectomy, urinary incontinence, skin CA, SOB, osteoporosis, myalgia, hypothyroidism, HTN, HLD, migraines, HIH, hx of embolism, DM, back pain, R small toe surgery, B RTC repair, cervical fusion C5-6, C6-7, C7-T1, T1-2,    PT Frequency  1x / week    PT Duration  4 weeks    PT Treatment/Interventions  ADLs/Self Care Home Management;Canalith Repostioning;Cryotherapy;Moist Heat;Balance training;Therapeutic exercise;Therapeutic activities;Functional mobility training;Stair training;Gait training;Neuromuscular re-education;Patient/family education;Orthotic Fit/Training;Manual techniques;Vestibular;Taping;Energy conservation;Passive range of motion    PT Next Visit Plan  progress balance, gait speed, strengthening    Consulted and Agree with Plan of Care  Patient;Family member/caregiver    Family Member Consulted   daughter       Patient will benefit from skilled therapeutic intervention in order to improve the following deficits and impairments:  Abnormal gait, Decreased activity tolerance, Decreased strength, Decreased balance, Decreased mobility, Difficulty walking, Improper body mechanics, Decreased range of motion, Impaired flexibility, Postural dysfunction  Visit Diagnosis: Dizziness and giddiness  Unsteadiness on feet  Difficulty in walking, not elsewhere classified     Problem List Patient Active Problem List   Diagnosis Date  Noted  . Dizziness 11/12/2018  . Subdural hematoma ([place]) 11/12/2018  . Intracranial bleed ([place]) 11/09/2018  . Vitamin B 12 deficiency 03/10/2018  . Goals of care, counseling/discussion 11/12/2017  . IDA (iron deficiency anemia) 03/12/2017  . Radiculopathy 02/17/2017  . Lung cancer metastatic to bone ([place]) 02/05/2017  . Thyrotoxicosis without thyroid storm 01/18/2017  . Bilateral hearing loss 08/27/2016  . Toxic multinodular goiter w/o crisis 06/22/2016  . Macular degeneration 03/09/2016  . Encounter for antineoplastic chemotherapy 12/19/2015  . Meralgia paresthetica of left side 07/10/2015  . Osteoporosis 07/10/2015  . Onychomycosis 07/10/2015  . Diabetes ([place]) 07/10/2015  . Pneumothorax after biopsy 07/09/2011    Class: Acute  . Primary adenocarcinoma of lower lobe of right lung ([place]) 06/24/2011  . Hypercholesterolemia 10/20/2010  . Essential hypertension, benign 10/20/2010  . Osteoarthritis 10/20/2010    Janene Harvey, PT, DPT 01/27/19 11:48 AM   [facility] [address]  [place], [place], [address] Phone: [phone]   Fax:  [phone]  Name: Samantha Quinn MRN: [id number] Date of Birth: 1927/11/28

## 2019-01-27 NOTE — Addendum Note (Signed)
Addended by: Janene Harvey D on: 01/27/2019 11:50 AM   Modules accepted: Orders

## 2019-02-05 ENCOUNTER — Other Ambulatory Visit: Payer: Self-pay | Admitting: Internal Medicine

## 2019-02-05 DIAGNOSIS — E119 Type 2 diabetes mellitus without complications: Secondary | ICD-10-CM

## 2019-02-06 ENCOUNTER — Ambulatory Visit: Payer: Medicare Other | Admitting: Physical Therapy

## 2019-02-08 ENCOUNTER — Ambulatory Visit: Payer: Medicare Other | Admitting: Physical Therapy

## 2019-02-08 ENCOUNTER — Other Ambulatory Visit: Payer: Self-pay

## 2019-02-08 ENCOUNTER — Encounter: Payer: Self-pay | Admitting: Physical Therapy

## 2019-02-08 VITALS — BP 128/58 | HR 80

## 2019-02-08 DIAGNOSIS — R42 Dizziness and giddiness: Secondary | ICD-10-CM

## 2019-02-08 DIAGNOSIS — R2681 Unsteadiness on feet: Secondary | ICD-10-CM

## 2019-02-08 DIAGNOSIS — R262 Difficulty in walking, not elsewhere classified: Secondary | ICD-10-CM | POA: Diagnosis not present

## 2019-02-08 NOTE — Therapy (Signed)
[facility] [address]  [place], [place], [address] Phone: [phone]   Fax:  [phone]  Physical Therapy Treatment  Patient Details  Name: Samantha Quinn MRN: [id number] Date of Birth: 08/07/1927 Referring Provider (PT): Burney Gauze, MD    Encounter Date: 02/08/2019  PT End of Session - 02/08/19 1538    Visit Number  7    Number of Visits  10    Date for PT Re-Evaluation  03/03/19    Authorization Type  Medicare & BCBS    PT Start Time  1324    PT Stop Time  1537    PT Time Calculation (min)  52 min    Equipment Utilized During Treatment  Gait belt    Activity Tolerance  Patient tolerated treatment well;Patient limited by fatigue   limited by dizziness   Behavior During Therapy  [facility] for tasks assessed/performed       Past Medical History:  Diagnosis Date  . Anxiety   . Back pain    bulding disc  . Bronchitis    hx of-many,many years   . Diabetes mellitus   . Diabetes mellitus    type 2 and takes Metformin bid  . Dyslipidemia   . Embolism - blood clot    hx of in left lower leg and was on Coumadin for about 24month;this was about 8-179yrago  . Family history of adverse reaction to anesthesia    Daughter- Nausea  . Goals of care, counseling/discussion 11/12/2017  . History of colonic polyps   . History of radiation therapy 01/06/16-02/19/16   The primary tumor and involved mediastinal adenopathy were treated to 66 Gy in 33 fractions of 2 Gy.  . Marland Kitchenistory of radiation therapy 04/01/17-04/26/17   cervical spine 35 Gy in 14 fractions  . HOH (hard of hearing)   . Hx of migraines    as a teenager  . Hypercholesterolemia   . Hyperlipidemia    takes Lovastatin every other day  . Hypertension    takes Ziac daily and Ramipril as well  . Hyperthyroidism   . Hypothyroidism    takes Synthroid every other day  . Insomnia    takes Elavil nightly  . Joint pain   . Lung mass   . Myalgia   .  Osteoarthritis   . Osteoporosis   . Pneumothorax of right lung after biopsy 07/09/11   HAD CHEST TUBE PLACEMENT  . PONV (postoperative nausea and vomiting)   . Primary adenocarcinoma of [place] lobe of right lung ([facility]/27/2013   pT2a, pN0 M0 Stage 1 Well differenced Adenocarcinoma 3.5 cm resected 07/17/2011  . Primary adenocarcinoma of lung ([facility]/27/2013  . Shortness of breath dyspnea    With exertion  . Skin cancer    legs have dark spots on them  . Upper respiratory infection 04/2011  . Urinary incontinence    takes Detrol daily prn    Past Surgical History:  Procedure Laterality Date  . ABDOMINAL HYSTERECTOMY    . ANTERIOR CERVICAL DECOMP/DISCECTOMY FUSION N/A 02/17/2017   Procedure: ANTERIOR CERVICAL DECOMPRESSION FUSION, CERVICAL 6-7 WITH INSTRUMENTATION AND ALLOGRAFT;  Surgeon: DuPhylliss BobMD;  [facility] Service: Orthopedics;  Laterality: N/A;  ANTERIOR CERVICAL DECOMPRESSION FUSION, CERVICAL 6-7 WITH INSTRUMENTATION AND ALLOGRAFT; REQUEST 2 HOURS AND FLIP   . APPENDECTOMY     as a child  . CHEST TUBE REMOVAL  07/13/11   RIGHT  CHEST TUBE  . CHOLECYSTECTOMY    .  COLONOSCOPY    . COLONOSCOPY    . DILATION AND CURETTAGE OF UTERUS     x 2  . EYE SURGERY Bilateral    Cataract  . HEMORRHOID SURGERY    . IR THORACENTESIS ASP PLEURAL SPACE W/IMG GUIDE  02/03/2018  . LUNG BIOPSY  07/09/11   RIGHT UPER LOBE LUNG MASS   . LUNG LOBECTOMY Right 06/2011  . POSTERIOR CERVICAL FUSION/FORAMINOTOMY N/A 02/18/2017   Procedure: CERVICAL Five-Six, CERVICAL Six-Seven, CERVICAL Seven-THORACIC One, THORACIC One-Two POSTERIOR SPINAL FUSION WITH INSTRUMENTATION AND ALLOGRAFT;  Surgeon: Phylliss Bob, MD;  Location: [place];  Service: Orthopedics;  Laterality: N/A;  . RIGHT CHEST TUBE PLACEMENT  07/10/11   S/P LUNG BX  . ROTATOR CUFF REPAIR     bilateral  . TOE SURGERY  2013   right small toe  . TONSILLECTOMY     as a child  . VIDEO BRONCHOSCOPY  07/17/2011   Procedure: VIDEO  BRONCHOSCOPY;  Surgeon: Grace Isaac, MD;  Location: [place];  Service: Thoracic;  Laterality: N/A;  . VIDEO BRONCHOSCOPY WITH ENDOBRONCHIAL ULTRASOUND N/A 12/10/2015   Procedure: VIDEO BRONCHOSCOPY WITH ENDOBRONCHIAL ULTRASOUND with biopsies.;  Surgeon: Grace Isaac, MD;  Location: [place];  Service: Thoracic;  Laterality: N/A;    Vitals:   02/08/19 1448 02/08/19 1454 02/08/19 1516  BP: (!) 130/54 (!) 128/58   Pulse: 78  80  SpO2: 94%  90%    Subjective Assessment - 02/08/19 1451    Subjective  Hasn't had much to drink today. Brought in another cane to try out again. Her dizzy head is better but she just doesn't have any energy. Feels like this is both d/t her CA and the infusions she gets.    Pertinent History  stage 4 lung CA with hx radiation, chemo, and R lobectomy, urinary incontinence, skin CA, SOB, osteoporosis, myalgia, hypothyroidism, HTN, HLD, migraines, HIH, hx of embolism, DM, back pain, R small toe surgery, B RTC repair, cervical fusion C5-6, C6-7, C7-T1, T1-2    Patient Stated Goals  work on my balance    Currently in Pain?  Yes    Pain Score  3     Pain Location  Shoulder    Pain Orientation  Right;Left;Upper    Pain Descriptors / Indicators  Discomfort    Pain Type  Chronic pain                       [facility] Adult PT Treatment/Exercise - 02/08/19 0001      Ambulation/Gait   Ambulation Distance (Feet)  90 Feet    Assistive device  Straight cane    Gait Pattern  Step-to pattern;Step-through pattern;Decreased step length - right;Decreased step length - left;Trunk flexed;Narrow base of support;Poor foot clearance - right;Poor foot clearance - left    Gait Comments  excellent ability to carry over SPC sequencing and good stability      Knee/Hip Exercises: Aerobic   Nustep  L2 x 6 min (UEs/LEs)   SpO2 92%, HR 80bpm after warm up     Knee/Hip Exercises: Standing   Heel Raises  Both;1 set;10 reps    Heel Raises Limitations  heel/toe raise with B UEs  on chair    Functional Squat  1 set;10 reps    Functional Squat Limitations  mini squat at chair    Other Standing Knee Exercises  alt toe tap on dynadisc with CGA 2x10      Knee/Hip Exercises: Seated  Sit to Sand  2 sets;5 reps;with UE support   1 UE use; 2nd set standing on airex- unable complete last re            PT Education - 02/08/19 1538    Education Details  edu on improving endurance by starting light walking regimen while supervised by family member    Person(s) Educated  Patient    Methods  Explanation;Demonstration    Comprehension  Verbalized understanding       PT Short Term Goals - 01/27/19 0804      PT SHORT TERM GOAL #1   Title  Patient to be independent with initial HEP.    Time  3    Period  Weeks    Status  On-going   reports intermittent compliance   Target Date  02/17/19        PT Long Term Goals - 01/27/19 0805      PT LONG TERM GOAL #1   Title  Patient to report 0/10 dizziness with R and L sidelying test.    Time  5    Period  Weeks    Status  Deferred   NT today per patient's request as she drove herself to the appointment   Target Date  03/03/19      PT LONG TERM GOAL #2   Title  Patient to demonstrate good stability, step length, and upright posture when ambulating with LRAD.    Time  5    Period  Weeks    Status  Partially Met   improved stability, still demonstrating decreased speed and kyphotic posture   Target Date  03/03/19      PT LONG TERM GOAL #3   Title  Patient to report no dizziness with bed mobility.    Time  6    Period  Weeks    Status  Achieved      PT LONG TERM GOAL #4   Title  Patient to score >/=45/56 on Berg to decrease risk of falls.    Time  6    Period  Weeks    Status  Achieved   52/56     PT LONG TERM GOAL #5   Title  Patient to score <14 sec on TUG with LRAD in order to decrease risk of falls.    Time  5    Status  New    Target Date  03/03/19      Additional Long Term Goals   Additional  Long Term Goals  Yes      PT LONG TERM GOAL #6   Title  Patient to score < 15 sec on 5xSTS without UE support in order to decrease risk of falls.    Time  5    Period  Weeks    Status  New    Target Date  03/03/19            Plan - 02/08/19 1539    Clinical Impression Statement  Patient bringing in another [facility] this session for proper adjustment. Reporting that dizziness is staying well-managed, but generally feeling fatigued d/t her CA and CA infusion that she receives. Feels that her fatigue is limiting her in performing her HEP consistently. Patient able to perform STS with improved speed and confidence today, however still requiring 1 UE support. Increased challenge with STS on foam, however patient highly apprehensive to perform this activity. Able to complete total of 4 reps with CGA before requiring rest break d/t  fatigue. Instructed patient on pursed lip breathing and monitored vitals, allowing SpO2 to return to >90% before returning to activity. Demonstrated good stability to alternate toe tap, not requiring any UE support. Educated patient on improving endurance by starting light walking regimen while supervised by family member- patient suggesting use of her husband's 4WW for this activity. Patient without complaints at end of session. Patient progressing well towards goals.    Comorbidities  stage 4 lung CA with hx radiation, chemo, and R lobectomy, urinary incontinence, skin CA, SOB, osteoporosis, myalgia, hypothyroidism, HTN, HLD, migraines, HIH, hx of embolism, DM, back pain, R small toe surgery, B RTC repair, cervical fusion C5-6, C6-7, C7-T1, T1-2,    PT Frequency  1x / week    PT Duration  4 weeks    PT Treatment/Interventions  ADLs/Self Care Home Management;Canalith Repostioning;Cryotherapy;Moist Heat;Balance training;Therapeutic exercise;Therapeutic activities;Functional mobility training;Stair training;Gait training;Neuromuscular re-education;Patient/family education;Orthotic  Fit/Training;Manual techniques;Vestibular;Taping;Energy conservation;Passive range of motion    PT Next Visit Plan  progress balance, gait speed, strengthening    Consulted and Agree with Plan of Care  Patient;Family member/caregiver    Family Member Consulted  daughter       Patient will benefit from skilled therapeutic intervention in order to improve the following deficits and impairments:  Abnormal gait, Decreased activity tolerance, Decreased strength, Decreased balance, Decreased mobility, Difficulty walking, Improper body mechanics, Decreased range of motion, Impaired flexibility, Postural dysfunction  Visit Diagnosis: Dizziness and giddiness  Unsteadiness on feet  Difficulty in walking, not elsewhere classified     Problem List Patient Active Problem List   Diagnosis Date Noted  . Dizziness 11/12/2018  . Subdural hematoma ([place]) 11/12/2018  . Intracranial bleed ([place]) 11/09/2018  . Vitamin B 12 deficiency 03/10/2018  . Goals of care, counseling/discussion 11/12/2017  . IDA (iron deficiency anemia) 03/12/2017  . Radiculopathy 02/17/2017  . Lung cancer metastatic to bone ([place]) 02/05/2017  . Thyrotoxicosis without thyroid storm 01/18/2017  . Bilateral hearing loss 08/27/2016  . Toxic multinodular goiter w/o crisis 06/22/2016  . Macular degeneration 03/09/2016  . Encounter for antineoplastic chemotherapy 12/19/2015  . Meralgia paresthetica of left side 07/10/2015  . Osteoporosis 07/10/2015  . Onychomycosis 07/10/2015  . Diabetes ([place]) 07/10/2015  . Pneumothorax after biopsy 07/09/2011    Class: Acute  . Primary adenocarcinoma of lower lobe of right lung ([place]) 06/24/2011  . Hypercholesterolemia 10/20/2010  . Essential hypertension, benign 10/20/2010  . Osteoarthritis 10/20/2010    Janene Harvey, PT, DPT 02/08/19 3:46 PM   [facility] [address]  [place], [place], [address] Phone: [phone]    Fax:  [phone]  Name: Samantha Quinn MRN: [id number] Date of Birth: 12-17-1927

## 2019-02-15 ENCOUNTER — Other Ambulatory Visit: Payer: Self-pay

## 2019-02-15 ENCOUNTER — Ambulatory Visit: Payer: Medicare Other | Admitting: Physical Therapy

## 2019-02-15 ENCOUNTER — Encounter: Payer: Self-pay | Admitting: Physical Therapy

## 2019-02-15 VITALS — BP 120/50 | HR 82

## 2019-02-15 DIAGNOSIS — R262 Difficulty in walking, not elsewhere classified: Secondary | ICD-10-CM

## 2019-02-15 DIAGNOSIS — R2681 Unsteadiness on feet: Secondary | ICD-10-CM

## 2019-02-15 DIAGNOSIS — R42 Dizziness and giddiness: Secondary | ICD-10-CM | POA: Diagnosis not present

## 2019-02-15 NOTE — Therapy (Addendum)
[facility] [address]  [place], [place], [address] Phone: [phone]   Fax:  [phone]  Physical Therapy Treatment  Patient Details  Name: Samantha Quinn MRN: [id number] Date of Birth: [birth year] Referring Provider (PT): Burney Gauze, MD   Progress Note Reporting Period 12/01/18 to 02/15/19  See note below for Objective Data and Assessment of Progress/Goals.     Encounter Date: 02/15/2019  PT End of Session - 02/15/19 1658    Visit Number  8    Number of Visits  10    Date for PT Re-Evaluation  03/03/19    Authorization Type  Medicare & BCBS    PT Start Time  1618    PT Stop Time  1658    PT Time Calculation (min)  40 min    Equipment Utilized During Treatment  Gait belt    Activity Tolerance  Patient tolerated treatment well;Patient limited by fatigue   limited by dizziness   Behavior During Therapy  WFL for tasks assessed/performed       Past Medical History:  Diagnosis Date  . Anxiety   . Back pain    bulding disc  . Bronchitis    hx of-many,many years   . Diabetes mellitus   . Diabetes mellitus    type 2 and takes Metformin bid  . Dyslipidemia   . Embolism - blood clot    hx of in left lower leg and was on Coumadin for about 50month;this was about 8-173yrago  . Family history of adverse reaction to anesthesia    Daughter- Nausea  . Goals of care, counseling/discussion 11/12/2017  . History of colonic polyps   . History of radiation therapy 01/06/16-02/19/16   The primary tumor and involved mediastinal adenopathy were treated to 66 Gy in 33 fractions of 2 Gy.  . Marland Kitchenistory of radiation therapy 04/01/17-04/26/17   cervical spine 35 Gy in 14 fractions  . HOH (hard of hearing)   . Hx of migraines    as a teenager  . Hypercholesterolemia   . Hyperlipidemia    takes Lovastatin every other day  . Hypertension    takes Ziac daily and Ramipril as well  . Hyperthyroidism   . Hypothyroidism     takes Synthroid every other day  . Insomnia    takes Elavil nightly  . Joint pain   . Lung mass   . Myalgia   . Osteoarthritis   . Osteoporosis   . Pneumothorax of right lung after biopsy 07/09/11   HAD CHEST TUBE PLACEMENT  . PONV (postoperative nausea and vomiting)   . Primary adenocarcinoma of [place] lobe of right lung ([facility]/2013   pT2a, pN0 M0 Stage 1 Well differenced Adenocarcinoma 3.5 cm resected 07/17/2011  . Primary adenocarcinoma of lung ([facility]/27/2013  . Shortness of breath dyspnea    With exertion  . Skin cancer    legs have dark spots on them  . Upper respiratory infection 04/2011  . Urinary incontinence    takes Detrol daily prn    Past Surgical History:  Procedure Laterality Date  . ABDOMINAL HYSTERECTOMY    . ANTERIOR CERVICAL DECOMP/DISCECTOMY FUSION N/A 02/17/2017   Procedure: ANTERIOR CERVICAL DECOMPRESSION FUSION, CERVICAL 6-7 WITH INSTRUMENTATION AND ALLOGRAFT;  Surgeon: DuPhylliss BobMD;  [facility] Service: Orthopedics;  Laterality: N/A;  ANTERIOR CERVICAL DECOMPRESSION FUSION, CERVICAL 6-7 WITH INSTRUMENTATION AND ALLOGRAFT; REQUEST 2 HOURS AND FLIP   . APPENDECTOMY  as a child  . CHEST TUBE REMOVAL  07/13/11   RIGHT  CHEST TUBE  . CHOLECYSTECTOMY    . COLONOSCOPY    . COLONOSCOPY    . DILATION AND CURETTAGE OF UTERUS     x 2  . EYE SURGERY Bilateral    Cataract  . HEMORRHOID SURGERY    . IR THORACENTESIS ASP PLEURAL SPACE W/IMG GUIDE  02/03/2018  . LUNG BIOPSY  07/09/11   RIGHT UPER LOBE LUNG MASS   . LUNG LOBECTOMY Right 06/2011  . POSTERIOR CERVICAL FUSION/FORAMINOTOMY N/A 02/18/2017   Procedure: CERVICAL Five-Six, CERVICAL Six-Seven, CERVICAL Seven-THORACIC One, THORACIC One-Two POSTERIOR SPINAL FUSION WITH INSTRUMENTATION AND ALLOGRAFT;  Surgeon: Phylliss Bob, MD;  Location: [facility];  Service: Orthopedics;  Laterality: N/A;  . RIGHT CHEST TUBE PLACEMENT  07/10/11   S/P LUNG BX  . ROTATOR CUFF REPAIR     bilateral  . TOE  SURGERY  2013   right small toe  . TONSILLECTOMY     as a child  . VIDEO BRONCHOSCOPY  07/17/2011   Procedure: VIDEO BRONCHOSCOPY;  Surgeon: Grace Isaac, MD;  Location: [facility];  Service: Thoracic;  Laterality: N/A;  . VIDEO BRONCHOSCOPY WITH ENDOBRONCHIAL ULTRASOUND N/A 12/10/2015   Procedure: VIDEO BRONCHOSCOPY WITH ENDOBRONCHIAL ULTRASOUND with biopsies.;  Surgeon: Grace Isaac, MD;  [facility]: [facility];  Service: Thoracic;  Laterality: N/A;    Vitals:   02/15/19 1620  BP: (!) 120/50  Pulse: 82  SpO2: 99%    Subjective Assessment - 02/15/19 1623    Subjective  Feeling really worn out d/t her meds- planning to talk to Dr. Marin Olp about it.    Pertinent History  stage 4 lung CA with hx radiation, chemo, and R lobectomy, urinary incontinence, skin CA, SOB, osteoporosis, myalgia, hypothyroidism, HTN, HLD, migraines, HIH, hx of embolism, DM, back pain, R small toe surgery, B RTC repair, cervical fusion C5-6, C6-7, C7-T1, T1-2    Patient Stated Goals  work on my balance    Currently in Pain?  No/denies                       [facility] Adult PT Treatment/Exercise - 02/15/19 0001      Ambulation/Gait   Ambulation Distance (Feet)  90 Feet    Gait Pattern  Step-through pattern;Decreased step length - right;Decreased step length - left;Trunk flexed;Narrow base of support;Poor foot clearance - right;Poor foot clearance - left    Gait Comments  gait training while stepping over obstacles and with increasing/decreasing gait speed with CGA- good stability throughout      Knee/Hip Exercises: Aerobic   Nustep  L1 x 6 min (UEs/LEs)      Knee/Hip Exercises: Standing   Heel Raises  Both;1 set;15 reps    Heel Raises Limitations  heel/toe raise with B UEs on chair    Knee Flexion  Strengthening;Right;Left;1 set;10 reps    Knee Flexion Limitations  1# HS curl with UEs on back of chair    Hip Flexion  Stengthening;1 set;Knee bent;20 reps    Hip Flexion Limitations  resisted marching  at chair with 1# ankle wts    Functional Squat  1 set;10 reps    Functional Squat Limitations  mini squat at chair   cues to bring bottom back   SLS  SLS ring toss game on L LE with 1 UE on chair and CGA  PT Short Term Goals - 01/27/19 0804      PT SHORT TERM GOAL #1   Title  Patient to be independent with initial HEP.    Time  3    Period  Weeks    Status  On-going   reports intermittent compliance   Target Date  02/17/19        PT Long Term Goals - 01/27/19 0805      PT LONG TERM GOAL #1   Title  Patient to report 0/10 dizziness with R and L sidelying test.    Time  5    Period  Weeks    Status  Deferred   NT today per patient's request as she drove herself to the appointment   Target Date  03/03/19      PT LONG TERM GOAL #2   Title  Patient to demonstrate good stability, step length, and upright posture when ambulating with LRAD.    Time  5    Period  Weeks    Status  Partially Met   improved stability, still demonstrating decreased speed and kyphotic posture   Target Date  03/03/19      PT LONG TERM GOAL #3   Title  Patient to report no dizziness with bed mobility.    Time  6    Period  Weeks    Status  Achieved      PT LONG TERM GOAL #4   Title  Patient to score >/=45/56 on Berg to decrease risk of falls.    Time  6    Period  Weeks    Status  Achieved   52/56     PT LONG TERM GOAL #5   Title  Patient to score <14 sec on TUG with LRAD in order to decrease risk of falls.    Time  5    Status  New    Target Date  03/03/19      Additional Long Term Goals   Additional Long Term Goals  Yes      PT LONG TERM GOAL #6   Title  Patient to score < 15 sec on 5xSTS without UE support in order to decrease risk of falls.    Time  5    Period  Weeks    Status  New    Target Date  03/03/19            Plan - 02/15/19 1658    Clinical Impression Statement  Patient continuing to c/o fatigue d/t her cancer meds- notes that she is  thinking of talking to her MD about it at her next appointment. Feels that performing her ADLs alone is enough to get her fatigued. Monitored vitals throughout session, which were [facility], however patient frequently requiring sitting rest breaks d/t fatigue. Patient tolerated standing weighted EL strengthening with UE support on chair. Required cues for form with mini squats, with good carryover. Worked on single leg balance and gait training activities with patient demonstrating good overall stability. CGA provided to increase patient's confidence as she is hesitant to perform new activities. Patient progressing well per POC and without complaints at end of session.    Comorbidities  stage 4 lung CA with hx radiation, chemo, and R lobectomy, urinary incontinence, skin CA, SOB, osteoporosis, myalgia, hypothyroidism, HTN, HLD, migraines, HIH, hx of embolism, DM, back pain, R small toe surgery, B RTC repair, cervical fusion C5-6, C6-7, C7-T1, T1-2,    PT Frequency  1x / week  PT Duration  4 weeks    PT Treatment/Interventions  ADLs/Self Care Home Management;Canalith Repostioning;Cryotherapy;Moist Heat;Balance training;Therapeutic exercise;Therapeutic activities;Functional mobility training;Stair training;Gait training;Neuromuscular re-education;Patient/family education;Orthotic Fit/Training;Manual techniques;Vestibular;Taping;Energy conservation;Passive range of motion    PT Next Visit Plan  progress balance, gait speed, strengthening    Consulted and Agree with Plan of Care  Patient    Family Member Consulted  daughter       Patient will benefit from skilled therapeutic intervention in order to improve the following deficits and impairments:  Abnormal gait, Decreased activity tolerance, Decreased strength, Decreased balance, Decreased mobility, Difficulty walking, Improper body mechanics, Decreased range of motion, Impaired flexibility, Postural dysfunction  Visit Diagnosis: Dizziness and  giddiness  Unsteadiness on feet  Difficulty in walking, not elsewhere classified     Problem List Patient Active Problem List   Diagnosis Date Noted  . Dizziness 11/12/2018  . Subdural hematoma ([place]) 11/12/2018  . Intracranial bleed ([place]) 11/09/2018  . Vitamin B 12 deficiency 03/10/2018  . Goals of care, counseling/discussion 11/12/2017  . IDA (iron deficiency anemia) 03/12/2017  . Radiculopathy 02/17/2017  . Lung cancer metastatic to bone ([place]) 02/05/2017  . Thyrotoxicosis without thyroid storm 01/18/2017  . Bilateral hearing loss 08/27/2016  . Toxic multinodular goiter w/o crisis 06/22/2016  . Macular degeneration 03/09/2016  . Encounter for antineoplastic chemotherapy 12/19/2015  . Meralgia paresthetica of left side 07/10/2015  . Osteoporosis 07/10/2015  . Onychomycosis 07/10/2015  . Diabetes ([place]) 07/10/2015  . Pneumothorax after biopsy 07/09/2011    Class: Acute  . Primary adenocarcinoma of lower lobe of right lung ([place]) 06/24/2011  . Hypercholesterolemia 10/20/2010  . Essential hypertension, benign 10/20/2010  . Osteoarthritis 10/20/2010     Janene Harvey, PT, DPT 02/15/19 5:01 PM   [facility] [address]  [place], [place], [address] Phone: [phone]   Fax:  [phone]  Name: Samantha Quinn MRN: [id number] Date of Birth: 1927/08/13  PHYSICAL THERAPY DISCHARGE SUMMARY  Visits from Start of Care: 8  Current functional level related to goals / functional outcomes: Unable to assess; patient did not return   Remaining deficits: Unable to assess   Education / Equipment: HEP  Plan: Patient agrees to discharge.  Patient goals were partially met. Patient is being discharged due to not returning since the last visit.  ?????     Janene Harvey, PT, DPT 04/05/19 3:16 PM

## 2019-02-21 ENCOUNTER — Encounter: Payer: Self-pay | Admitting: Internal Medicine

## 2019-02-21 ENCOUNTER — Telehealth: Payer: Self-pay | Admitting: Internal Medicine

## 2019-02-21 ENCOUNTER — Other Ambulatory Visit: Payer: Medicare Other | Admitting: *Deleted

## 2019-02-21 ENCOUNTER — Other Ambulatory Visit: Payer: Medicare Other

## 2019-02-21 DIAGNOSIS — Z515 Encounter for palliative care: Secondary | ICD-10-CM

## 2019-02-21 DIAGNOSIS — R29818 Other symptoms and signs involving the nervous system: Secondary | ICD-10-CM

## 2019-02-21 NOTE — Telephone Encounter (Signed)
Would like an order put in for urgent if possible.

## 2019-02-21 NOTE — Telephone Encounter (Signed)
We need to get an MRI of her head.  If the pain is severe the quickest option is the ED.  Let me know I can order the MRI, but not sure how quickly it can be done.

## 2019-02-21 NOTE — Telephone Encounter (Signed)
Please advise on if Dr. Marin Olp should be contacted.

## 2019-02-21 NOTE — Telephone Encounter (Signed)
MRI ordered

## 2019-02-21 NOTE — Telephone Encounter (Signed)
Copied from [place] [id number]. Topic: General - Inquiry >> Feb 21, 2019  1:59 PM Virl Axe D wrote: Reason for CRM: Almetta Lovely, RN with Palliative care, stated she had a visit with pt per her daughters request. Pt complained of surges of shooting pain around temple, ear and face. It has been getting worse over the last two weeks. Also the right side of her tongue feels numb, as if she has been given novocain. She wanted RN to reach out to Dr. Quay Burow and see if she could follow up on this or if Dr. Marin Olp her oncologist needed to be contacted instead. Please advise. OP#[phone]

## 2019-02-22 ENCOUNTER — Ambulatory Visit: Payer: Medicare Other | Admitting: Physical Therapy

## 2019-02-22 ENCOUNTER — Other Ambulatory Visit: Payer: Self-pay

## 2019-02-22 ENCOUNTER — Other Ambulatory Visit: Payer: Medicare Other | Admitting: *Deleted

## 2019-02-22 NOTE — Telephone Encounter (Signed)
MRI set up. Pts is aware

## 2019-02-22 NOTE — Progress Notes (Signed)
COMMUNITY PALLIATIVE CARE RN NOTE  PATIENT NAME: Samantha Quinn DOB: [birth year] MRN: [id number]  PRIMARY CARE PROVIDER: Binnie Rail, MD  RESPONSIBLE PARTY:  Acct ID - Guarantor Home Phone Work Phone Relationship Acct Type  [id number] [name]  Self P/F     [address] [place], [place] [address]   Covid-19 Pre-screening Negative  PLAN OF CARE and INTERVENTION:  1. ADVANCE CARE PLANNING/GOALS OF CARE: Goal is for patient to remain in her home. She is a Full Code.  2. PATIENT/CAREGIVER EDUCATION: Pain Management, Safe Mobility 3. DISEASE STATUS: Received a call from patient's daughter, Samantha Quinn, requesting a RN visit. Beth present during visit. Patient reports that over the past 2-3 weeks that she has been experiencing worsening pain on the right side of her head/face. She describes the pain as sharp, sudden pain around her temporal lobe, right ear, right jaw region and right mouth. She also says that the right side of her tongue is numb and it is difficult to manipulate the food in her mouth. She mainly takes Tylenol during the day and 1/2 Hydrocodone at bedtime which helps. She denies feelings of dizziness. Increased shortness of breath reported with minimal activity. She is more tired/fatigued. Her appetite is decreasing. She denies dysphagia. She also reports chronic pain in her shoulders, neck and back and feeling more uncomfortable in these areas. She continues to receive Zometa infusions, but is concerned that she is experiencing many of the side effects listed that may come along with receiving this infusion. She also understands that it is given to keep her bones strong. Contacted her PCPs office and spoke with Lovena Le, CMA. Dr. Quay Burow feels that she needs a MRI of her head to assess what is going on. She is going to send a referral for a MRI today and the imaging center will contact the family to arrange date/time. She has an appointment with her Oncologist on November 5. Will  continue to monitor.  HISTORY OF PRESENT ILLNESS: This is a 85 yo female who resides at home alone. She has a very supportive family who visits frequently. Palliative care team continues to follow patient. Will continue to visit monthly and PRN.    CODE STATUS: Full Code ADVANCED DIRECTIVES: Y MOST FORM: no PPS: 50%   PHYSICAL EXAM:   VITALS: Today's Vitals   02/21/19 1254  BP: 127/76  Pulse: 68  Resp: 20  Temp: 97.6 F (36.4 C)  TempSrc: Temporal  SpO2: 99%  PainSc: 4   PainLoc: Head    LUNGS: clear to auscultation  CARDIAC: Cor RRR EXTREMITIES: No edema SKIN: No skin issues  NEURO: Alert and oriented x 3, HOH, generalized weakness, ambulatory   (Duration of visit and documentation 60 minutes)   Daryl Eastern, RN BSN

## 2019-02-24 ENCOUNTER — Other Ambulatory Visit: Payer: Self-pay

## 2019-02-24 ENCOUNTER — Telehealth: Payer: Self-pay

## 2019-02-24 ENCOUNTER — Ambulatory Visit
Admission: RE | Admit: 2019-02-24 | Discharge: 2019-02-24 | Disposition: A | Discharge status: Home / Self Care | Payer: Medicare Other | Source: Ambulatory Visit | Attending: Internal Medicine | Admitting: Internal Medicine

## 2019-02-24 DIAGNOSIS — G9389 Other specified disorders of brain: Secondary | ICD-10-CM | POA: Diagnosis not present

## 2019-02-24 DIAGNOSIS — J3489 Other specified disorders of nose and nasal sinuses: Secondary | ICD-10-CM | POA: Diagnosis not present

## 2019-02-24 DIAGNOSIS — C801 Malignant (primary) neoplasm, unspecified: Secondary | ICD-10-CM | POA: Diagnosis not present

## 2019-02-24 DIAGNOSIS — R9082 White matter disease, unspecified: Secondary | ICD-10-CM | POA: Diagnosis not present

## 2019-02-24 DIAGNOSIS — Z961 Presence of intraocular lens: Secondary | ICD-10-CM | POA: Diagnosis not present

## 2019-02-24 DIAGNOSIS — R29818 Other symptoms and signs involving the nervous system: Secondary | ICD-10-CM

## 2019-02-24 DIAGNOSIS — I6389 Other cerebral infarction: Secondary | ICD-10-CM | POA: Diagnosis not present

## 2019-02-24 DIAGNOSIS — C7951 Secondary malignant neoplasm of bone: Secondary | ICD-10-CM | POA: Diagnosis not present

## 2019-02-24 MED ORDER — GADOBENATE DIMEGLUMINE 529 MG/ML IV SOLN
10.0000 mL | Freq: Once | INTRAVENOUS | Status: AC | PRN
  Administered 2019-02-24: 10 mL via INTRAVENOUS

## 2019-02-24 NOTE — Telephone Encounter (Signed)
Copied from [place] [id number]. Topic: Referral - Question >> Feb 24, 2019  8:42 AM Berneta Levins wrote: Reason for CRM:  Pt's daughter, Benjamine Mola, calling.  States that pt has a MRI of brain for today and they would like that expanded to MRI of neck as well.  Wants to know if this can be done. Benjamine Mola can be reached at [phone]

## 2019-02-24 NOTE — Telephone Encounter (Signed)
Called daughter, Samantha Quinn, to discuss results of MRI.  At this point she will not inform her mother of the results.  Forwarded MRI to Dr. Marin Olp for his opinion.

## 2019-02-27 ENCOUNTER — Encounter: Payer: Self-pay | Admitting: Internal Medicine

## 2019-03-02 ENCOUNTER — Other Ambulatory Visit: Payer: Self-pay

## 2019-03-02 ENCOUNTER — Telehealth: Payer: Self-pay | Admitting: *Deleted

## 2019-03-02 ENCOUNTER — Inpatient Hospital Stay: Payer: Medicare Other

## 2019-03-02 ENCOUNTER — Encounter: Payer: Self-pay | Admitting: Hematology & Oncology

## 2019-03-02 ENCOUNTER — Inpatient Hospital Stay: Payer: Medicare Other | Attending: Hematology & Oncology | Admitting: Hematology & Oncology

## 2019-03-02 VITALS — BP 125/60 | HR 81 | Temp 97.9°F | Resp 18 | Wt 106.5 lb

## 2019-03-02 DIAGNOSIS — R531 Weakness: Secondary | ICD-10-CM | POA: Insufficient documentation

## 2019-03-02 DIAGNOSIS — Z9221 Personal history of antineoplastic chemotherapy: Secondary | ICD-10-CM | POA: Diagnosis not present

## 2019-03-02 DIAGNOSIS — M542 Cervicalgia: Secondary | ICD-10-CM | POA: Insufficient documentation

## 2019-03-02 DIAGNOSIS — G549 Nerve root and plexus disorder, unspecified: Secondary | ICD-10-CM | POA: Diagnosis not present

## 2019-03-02 DIAGNOSIS — M549 Dorsalgia, unspecified: Secondary | ICD-10-CM | POA: Insufficient documentation

## 2019-03-02 DIAGNOSIS — C3411 Malignant neoplasm of upper lobe, right bronchus or lung: Secondary | ICD-10-CM | POA: Insufficient documentation

## 2019-03-02 DIAGNOSIS — Z923 Personal history of irradiation: Secondary | ICD-10-CM | POA: Insufficient documentation

## 2019-03-02 DIAGNOSIS — R5383 Other fatigue: Secondary | ICD-10-CM | POA: Diagnosis not present

## 2019-03-02 DIAGNOSIS — C3431 Malignant neoplasm of lower lobe, right bronchus or lung: Secondary | ICD-10-CM

## 2019-03-02 DIAGNOSIS — C7951 Secondary malignant neoplasm of bone: Secondary | ICD-10-CM | POA: Insufficient documentation

## 2019-03-02 DIAGNOSIS — R634 Abnormal weight loss: Secondary | ICD-10-CM | POA: Insufficient documentation

## 2019-03-02 DIAGNOSIS — Z79899 Other long term (current) drug therapy: Secondary | ICD-10-CM | POA: Diagnosis not present

## 2019-03-02 DIAGNOSIS — D508 Other iron deficiency anemias: Secondary | ICD-10-CM

## 2019-03-02 LAB — CMP (CANCER CENTER ONLY)
ALT: 6 U/L (ref 0–44)
AST: 19 U/L (ref 15–41)
Albumin: 4.5 g/dL (ref 3.5–5.0)
Alkaline Phosphatase: 291 U/L — ABNORMAL HIGH (ref 38–126)
Anion gap: 11 (ref 5–15)
BUN: 17 mg/dL (ref 8–23)
CO2: 27 mmol/L (ref 22–32)
Calcium: 9.4 mg/dL (ref 8.9–10.3)
Chloride: 101 mmol/L (ref 98–111)
Creatinine: 0.98 mg/dL (ref 0.44–1.00)
GFR, Est AFR Am: 59 mL/min — ABNORMAL LOW (ref >60)
GFR, Estimated: 51 mL/min — ABNORMAL LOW (ref >60)
Glucose, Bld: 156 mg/dL — ABNORMAL HIGH (ref 70–99)
Potassium: 3.9 mmol/L (ref 3.5–5.1)
Sodium: 139 mmol/L (ref 135–145)
Total Bilirubin: 0.4 mg/dL (ref 0.3–1.2)
Total Protein: 6.8 g/dL (ref 6.5–8.1)

## 2019-03-02 LAB — CBC WITH DIFFERENTIAL (CANCER CENTER ONLY)
Abs Immature Granulocytes: 0.12 10*3/uL — ABNORMAL HIGH (ref 0.00–0.07)
Basophils Absolute: 0 10*3/uL (ref 0.0–0.1)
Basophils Relative: 0 %
Eosinophils Absolute: 0.1 10*3/uL (ref 0.0–0.5)
Eosinophils Relative: 1 %
HCT: 33.4 % — ABNORMAL LOW (ref 36.0–46.0)
Hemoglobin: 10.6 g/dL — ABNORMAL LOW (ref 12.0–15.0)
Immature Granulocytes: 1 %
Lymphocytes Relative: 4 %
Lymphs Abs: 0.4 10*3/uL — ABNORMAL LOW (ref 0.7–4.0)
MCH: 28.6 pg (ref 26.0–34.0)
MCHC: 31.7 g/dL (ref 30.0–36.0)
MCV: 90 fL (ref 80.0–100.0)
Monocytes Absolute: 0.6 10*3/uL (ref 0.1–1.0)
Monocytes Relative: 7 %
Neutro Abs: 7.9 10*3/uL — ABNORMAL HIGH (ref 1.7–7.7)
Neutrophils Relative %: 87 %
Platelet Count: 135 10*3/uL — ABNORMAL LOW (ref 150–400)
RBC: 3.71 MIL/uL — ABNORMAL LOW (ref 3.87–5.11)
RDW: 14.5 % (ref 11.5–15.5)
WBC Count: 9.1 10*3/uL (ref 4.0–10.5)
nRBC: 0 % (ref 0.0–0.2)

## 2019-03-02 LAB — IRON AND TIBC
Iron: 39 ug/dL — ABNORMAL LOW (ref 41–142)
Saturation Ratios: 15 % — ABNORMAL LOW (ref 21–57)
TIBC: 254 ug/dL (ref 236–444)
UIBC: 215 ug/dL (ref 120–384)

## 2019-03-02 LAB — FERRITIN: Ferritin: 1126 ng/mL — ABNORMAL HIGH (ref 11–307)

## 2019-03-02 MED ORDER — ZOLEDRONIC ACID 4 MG/5ML IV CONC
3.0000 mg | Freq: Once | INTRAVENOUS | Status: AC
  Administered 2019-03-02: 3 mg via INTRAVENOUS
  Filled 2019-03-02: qty 3.75

## 2019-03-02 MED ORDER — SODIUM CHLORIDE 0.9 % IV SOLN
INTRAVENOUS | Status: DC
  Administered 2019-03-02: 12:00:00 via INTRAVENOUS
  Filled 2019-03-02: qty 250

## 2019-03-02 NOTE — Progress Notes (Signed)
Hematology and Oncology Follow Up Visit  Samantha Quinn [id number] 1927-09-14 85 y.o. 03/02/2019   Principle Diagnosis:  Stage IV adenocarcinoma of the right lung-mutation negative C6-7 left nerve root compression secondary to metastatic disease - PD-L1 (0%) /TMB of 9  Past Therapy: Status post radiation/chemotherapy for recurrence-completed October 2017 Status post right upper lobectomy in March 2013 for stage IB disease XRT to the cervical spine - started 04/02/2017  Tecentriq 1200 mg IV every 3 weeks s/p cycle 8 -- d/c progression  Current Therapy:   Alimta/Avastin s/p cycle #5 -- d/c on 05/06/2018 Zometa 4 mg IV q4 weeks -next dose on  December 2020    Interim History:  Samantha Quinn is here today for follow-up.  Unfortunately, I think that we are now seeing that her cancer is becoming much more active.  She recently had an MRI of the brain.  This was ordered by her family doctor.  Dr. Quay Burow obviously is very compassionate and was very intuitive in doing the MRI of the brain.  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  I suspect that she probably has some sort of hypercoagulable state from her malignancy.  She is not a candidate for formal anticoagulation.  She I think would be at significant risk for bleeding with her not being steady.  I think that baby aspirin-81 mg-daily would be a reasonable way of trying to help her out.  She is losing weight.  She says she does not have much of an appetite.  She really cannot taste food.  Again I suspect this is probably from these infarcts that she has had.  I really believe that we are now looking at [facility] for her.  She says that palliative care comes in and helps.  I think we have to take this palliative care a little bit further and see about getting Hospice.  I talked to Samantha Quinn about this.  I would have to talk to her daughter about this.  Her weight loss really troubles me.  She really does not have a lot of weight to  lose.  Her alkaline phosphatase continues to go up.  I suspect this is consistent with her malignancy progressing.  We also talked about her driving.  I Do Not think that it is a good idea for her to be driving now.  I just worry about her having another infarct and then doing damage to her self and may be hurting somebody else.  I told her that I am sure that Dr. Quay Burow would agree with this.  I am sure that her family would agree with her not driving.  I would have to say that her current performance status is probably ECOG 3.  Medications:    Allergies:  Allergies  Allergen Reactions  . Ramipril Other (See Comments)    Tongue swelling  . Tramadol Other (See Comments)    syncope syncope  . Codeine Nausea And Vomiting  . Demerol Nausea Only    Past Medical History, Surgical history, Social history, and Family History were reviewed and updated.  Review of Systems: Review of Systems  Constitutional: Negative.   HENT: Negative.   Eyes: Negative.   Respiratory: Negative.   Cardiovascular: Negative.   Gastrointestinal: Negative.   Genitourinary: Negative.   Musculoskeletal: Positive for back pain and neck pain.  Skin: Negative.   Neurological: Negative.   Endo/Heme/Allergies: Negative.   Psychiatric/Behavioral: Negative.      Physical Exam:  weight is 106 lb  8 oz (48.3 kg). Her oral temperature is 97.9 F (36.6 C). Her blood pressure is 125/60 and her pulse is 81. Her respiration is 18 and oxygen saturation is 100%.   Wt Readings from Last 3 Encounters:  03/02/19 106 lb 8 oz (48.3 kg)  01/26/19 110 lb (49.9 kg)  12/22/18 111 lb (50.3 kg)    Physical Exam Vitals signs reviewed.  HENT:     Head: Normocephalic and atraumatic.  Eyes:     Pupils: Pupils are equal, round, and reactive to light.  Neck:     Musculoskeletal: Normal range of motion.  Cardiovascular:     Rate and Rhythm: Normal rate and regular rhythm.     Heart sounds: Normal heart sounds.  Pulmonary:      Effort: Pulmonary effort is normal.     Breath sounds: Normal breath sounds.  Abdominal:     General: Bowel sounds are normal.     Palpations: Abdomen is soft.  Musculoskeletal: Normal range of motion.        General: No tenderness or deformity.  Lymphadenopathy:     Cervical: No cervical adenopathy.  Skin:    General: Skin is warm and dry.     Findings: No erythema or rash.  Neurological:     Mental Status: She is alert and oriented to person, place, and time.  Psychiatric:        Behavior: Behavior normal.        Thought Content: Thought content normal.        Judgment: Judgment normal.      Lab Results  Component Value Date   WBC 9.1 03/02/2019   HGB 10.6 (L) 03/02/2019   HCT 33.4 (L) 03/02/2019   MCV 90.0 03/02/2019   PLT 135 (L) 03/02/2019   Lab Results  Component Value Date   FERRITIN 782 (H) 12/22/2018   IRON 57 11/24/2018   TIBC 273 11/24/2018   UIBC 216 11/24/2018   IRONPCTSAT 21 11/24/2018   Lab Results  Component Value Date   RETICCTPCT 2.1 05/06/2018   RBC 3.71 (L) 03/02/2019   No results found for: KPAFRELGTCHN, LAMBDASER, KAPLAMBRATIO No results found for: Kandis Cocking, IGMSERUM No results found for: Odetta Pink, SPEI   Chemistry      Component Value Date/Time   NA 139 03/02/2019 1004   NA 144 04/07/2017 1215   NA 135 (L) 03/11/2017 1416   K 3.9 03/02/2019 1004   K 4.0 04/07/2017 1215   K 3.9 03/11/2017 1416   CL 101 03/02/2019 1004   CL 103 04/07/2017 1215   CO2 27 03/02/2019 1004   CO2 29 04/07/2017 1215   CO2 23 03/11/2017 1416   BUN 17 03/02/2019 1004   BUN 13 04/07/2017 1215   BUN 16.1 03/11/2017 1416   CREATININE 0.98 03/02/2019 1004   CREATININE 0.8 04/07/2017 1215   CREATININE 0.9 03/11/2017 1416      Component Value Date/Time   CALCIUM 9.4 03/02/2019 1004   CALCIUM 9.6 04/07/2017 1215   CALCIUM 9.6 03/11/2017 1416   ALKPHOS 291 (H) 03/02/2019 1004   ALKPHOS 60  04/07/2017 1215   ALKPHOS 79 03/11/2017 1416   AST 19 03/02/2019 1004   AST 11 03/11/2017 1416   ALT 6 03/02/2019 1004   ALT 15 04/07/2017 1215   ALT 11 03/11/2017 1416   BILITOT 0.4 03/02/2019 1004   BILITOT 0.26 03/11/2017 1416      Impression and Plan: Samantha Quinn is  a very pleasant 85 yo caucasian female with progressive metastatic non small cell adenocarcinoma of the lung.  She clearly is changing now.  She just looks more fatigued and weak.  Again I told her that I really think that it is her cancer that is progressing and causing her to feel this way.  Hospice I believe would really be a great idea for her.  I will again talk to her daughter about this.  We will go ahead with Zometa today.  I do think this is helpful for her given that she does have these bony metastasis.  I spent about 40 minutes with her today.  I had to talk to her at length about what is going on.  I do think that she has a good understanding as to what is going on.  I would like to see her back in a month.  Her weight will certainly give me an idea as to what her prognosis will be.     [name], MD 11/5/202011:36 AM

## 2019-03-02 NOTE — Telephone Encounter (Signed)
Referral called to [facility] per order of Dr. Marin Olp.

## 2019-03-03 ENCOUNTER — Telehealth: Payer: Self-pay | Admitting: *Deleted

## 2019-03-03 ENCOUNTER — Telehealth: Payer: Self-pay | Admitting: Hematology & Oncology

## 2019-03-03 NOTE — Telephone Encounter (Signed)
-----   Message from Riley Lam sent at 03/03/2019  9:19 AM EST ----- Patient's daughter [name] has questions regarding her visit w/ Dr Marin Olp yesterday.   I'm sorry  [place]

## 2019-03-03 NOTE — Telephone Encounter (Signed)
Called and LMVM regarding appointment being added for Iron infusion per 11/5

## 2019-03-03 NOTE — Telephone Encounter (Signed)
Return call received from patient's daughter, [name].  Beth would like to know from Dr. Marin Olp "what should I expect in the future as far as possibility of stroke or heartache?"  Dr. Marin Olp notified.  Per Dr. Marin Olp, yes there is a possibility for stroke and heartache.  Call placed back to patient's daughter, [name] to notify her per order of Dr. Marin Olp that there is a possibility for stroke or heartache for patient and that hospice will be a huge support to them.  Emotional support given to patient's daughter.  Pt.'s daughter appreciative of assistance.

## 2019-03-03 NOTE — Telephone Encounter (Signed)
Call placed to patient's daughter [name] d/t message below.  No answer and message left for Beth to call office back with any questions that she may have.

## 2019-03-04 ENCOUNTER — Other Ambulatory Visit: Payer: Medicare Other

## 2019-03-06 DIAGNOSIS — F418 Other specified anxiety disorders: Secondary | ICD-10-CM | POA: Diagnosis not present

## 2019-03-06 DIAGNOSIS — Z6820 Body mass index (BMI) 20.0-20.9, adult: Secondary | ICD-10-CM | POA: Diagnosis not present

## 2019-03-06 DIAGNOSIS — Z923 Personal history of irradiation: Secondary | ICD-10-CM | POA: Diagnosis not present

## 2019-03-06 DIAGNOSIS — G549 Nerve root and plexus disorder, unspecified: Secondary | ICD-10-CM | POA: Diagnosis not present

## 2019-03-06 DIAGNOSIS — E0590 Thyrotoxicosis, unspecified without thyrotoxic crisis or storm: Secondary | ICD-10-CM | POA: Diagnosis not present

## 2019-03-06 DIAGNOSIS — E119 Type 2 diabetes mellitus without complications: Secondary | ICD-10-CM | POA: Diagnosis not present

## 2019-03-06 DIAGNOSIS — Z79899 Other long term (current) drug therapy: Secondary | ICD-10-CM | POA: Diagnosis not present

## 2019-03-06 DIAGNOSIS — E785 Hyperlipidemia, unspecified: Secondary | ICD-10-CM | POA: Diagnosis not present

## 2019-03-06 DIAGNOSIS — Z9221 Personal history of antineoplastic chemotherapy: Secondary | ICD-10-CM | POA: Diagnosis not present

## 2019-03-06 DIAGNOSIS — C7951 Secondary malignant neoplasm of bone: Secondary | ICD-10-CM | POA: Diagnosis not present

## 2019-03-06 DIAGNOSIS — I10 Essential (primary) hypertension: Secondary | ICD-10-CM | POA: Diagnosis not present

## 2019-03-06 DIAGNOSIS — R5383 Other fatigue: Secondary | ICD-10-CM | POA: Diagnosis not present

## 2019-03-06 DIAGNOSIS — R531 Weakness: Secondary | ICD-10-CM | POA: Diagnosis not present

## 2019-03-06 DIAGNOSIS — J90 Pleural effusion, not elsewhere classified: Secondary | ICD-10-CM | POA: Diagnosis not present

## 2019-03-06 DIAGNOSIS — S065X0D Traumatic subdural hemorrhage without loss of consciousness, subsequent encounter: Secondary | ICD-10-CM | POA: Diagnosis not present

## 2019-03-06 DIAGNOSIS — R634 Abnormal weight loss: Secondary | ICD-10-CM | POA: Diagnosis not present

## 2019-03-06 DIAGNOSIS — S0280XD Fracture of other specified skull and facial bones, unspecified side, subsequent encounter for fracture with routine healing: Secondary | ICD-10-CM | POA: Diagnosis not present

## 2019-03-06 DIAGNOSIS — M549 Dorsalgia, unspecified: Secondary | ICD-10-CM | POA: Diagnosis not present

## 2019-03-06 DIAGNOSIS — M542 Cervicalgia: Secondary | ICD-10-CM | POA: Diagnosis not present

## 2019-03-06 DIAGNOSIS — C3411 Malignant neoplasm of upper lobe, right bronchus or lung: Secondary | ICD-10-CM | POA: Diagnosis present

## 2019-03-06 DIAGNOSIS — C3491 Malignant neoplasm of unspecified part of right bronchus or lung: Secondary | ICD-10-CM | POA: Diagnosis not present

## 2019-03-07 ENCOUNTER — Other Ambulatory Visit: Payer: Self-pay

## 2019-03-07 ENCOUNTER — Inpatient Hospital Stay: Payer: Medicare Other

## 2019-03-07 VITALS — BP 114/59 | HR 70 | Temp 97.2°F | Resp 18

## 2019-03-07 DIAGNOSIS — C3411 Malignant neoplasm of upper lobe, right bronchus or lung: Secondary | ICD-10-CM | POA: Diagnosis not present

## 2019-03-07 DIAGNOSIS — C3431 Malignant neoplasm of lower lobe, right bronchus or lung: Secondary | ICD-10-CM

## 2019-03-07 DIAGNOSIS — D508 Other iron deficiency anemias: Secondary | ICD-10-CM

## 2019-03-07 MED ORDER — SODIUM CHLORIDE 0.9 % IV SOLN
510.0000 mg | Freq: Once | INTRAVENOUS | Status: AC
  Administered 2019-03-07: 510 mg via INTRAVENOUS
  Filled 2019-03-07: qty 17

## 2019-03-07 MED ORDER — SODIUM CHLORIDE 0.9 % IV SOLN
INTRAVENOUS | Status: DC
  Administered 2019-03-07: 13:00:00 via INTRAVENOUS
  Filled 2019-03-07: qty 250

## 2019-03-07 NOTE — Patient Instructions (Signed)
Ferumoxytol injection (Feraheme) What is this medicine? FERUMOXYTOL is an iron complex. Iron is used to make healthy red blood cells, which carry oxygen and nutrients throughout the body. This medicine is used to treat iron deficiency anemia. This medicine may be used for other purposes; ask your health care provider or pharmacist if you have questions. COMMON BRAND NAME(S): Feraheme What should I tell my health care provider before I take this medicine? They need to know if you have any of these conditions:  anemia not caused by low iron levels  high levels of iron in the blood  magnetic resonance imaging (MRI) test scheduled  an unusual or allergic reaction to iron, other medicines, foods, dyes, or preservatives  pregnant or trying to get pregnant  breast-feeding How should I use this medicine? This medicine is for injection into a vein. It is given by a health care professional in a hospital or clinic setting. Talk to your pediatrician regarding the use of this medicine in children. Special care may be needed. Overdosage: If you think you have taken too much of this medicine contact a poison control center or emergency room at once. NOTE: This medicine is only for you. Do not share this medicine with others. What if I miss a dose? It is important not to miss your dose. Call your doctor or health care professional if you are unable to keep an appointment. What may interact with this medicine? This medicine may interact with the following medications:  other iron products This list may not describe all possible interactions. Give your health care provider a list of all the medicines, herbs, non-prescription drugs, or dietary supplements you use. Also tell them if you smoke, drink alcohol, or use illegal drugs. Some items may interact with your medicine. What should I watch for while using this medicine? Visit your doctor or healthcare professional regularly. Tell your doctor or  healthcare professional if your symptoms do not start to get better or if they get worse. You may need blood work done while you are taking this medicine. You may need to follow a special diet. Talk to your doctor. Foods that contain iron include: whole grains/cereals, dried fruits, beans, or peas, leafy green vegetables, and organ meats (liver, kidney). What side effects may I notice from receiving this medicine? Side effects that you should report to your doctor or health care professional as soon as possible:  allergic reactions like skin rash, itching or hives, swelling of the face, lips, or tongue  breathing problems  changes in blood pressure  feeling faint or lightheaded, falls  fever or chills  flushing, sweating, or hot feelings  swelling of the ankles or feet Side effects that usually do not require medical attention (report to your doctor or health care professional if they continue or are bothersome):  diarrhea  headache  nausea, vomiting  stomach pain This list may not describe all possible side effects. Call your doctor for medical advice about side effects. You may report side effects to FDA at 1-800-FDA-1088. Where should I keep my medicine? This drug is given in a hospital or clinic and will not be stored at home. NOTE: This sheet is a summary. It may not cover all possible information. If you have questions about this medicine, talk to your doctor, pharmacist, or health care provider.  2020 Elsevier/Gold Standard (2016-06-01 20:21:10)  

## 2019-03-08 DIAGNOSIS — C3491 Malignant neoplasm of unspecified part of right bronchus or lung: Secondary | ICD-10-CM | POA: Diagnosis not present

## 2019-03-08 DIAGNOSIS — J90 Pleural effusion, not elsewhere classified: Secondary | ICD-10-CM | POA: Diagnosis not present

## 2019-03-08 DIAGNOSIS — E119 Type 2 diabetes mellitus without complications: Secondary | ICD-10-CM | POA: Diagnosis not present

## 2019-03-08 DIAGNOSIS — C7951 Secondary malignant neoplasm of bone: Secondary | ICD-10-CM | POA: Diagnosis not present

## 2019-03-08 DIAGNOSIS — E785 Hyperlipidemia, unspecified: Secondary | ICD-10-CM | POA: Diagnosis not present

## 2019-03-08 DIAGNOSIS — I10 Essential (primary) hypertension: Secondary | ICD-10-CM | POA: Diagnosis not present

## 2019-03-09 DIAGNOSIS — E119 Type 2 diabetes mellitus without complications: Secondary | ICD-10-CM | POA: Diagnosis not present

## 2019-03-09 DIAGNOSIS — J90 Pleural effusion, not elsewhere classified: Secondary | ICD-10-CM | POA: Diagnosis not present

## 2019-03-09 DIAGNOSIS — C7951 Secondary malignant neoplasm of bone: Secondary | ICD-10-CM | POA: Diagnosis not present

## 2019-03-09 DIAGNOSIS — E785 Hyperlipidemia, unspecified: Secondary | ICD-10-CM | POA: Diagnosis not present

## 2019-03-09 DIAGNOSIS — I10 Essential (primary) hypertension: Secondary | ICD-10-CM | POA: Diagnosis not present

## 2019-03-09 DIAGNOSIS — C3491 Malignant neoplasm of unspecified part of right bronchus or lung: Secondary | ICD-10-CM | POA: Diagnosis not present

## 2019-03-15 ENCOUNTER — Other Ambulatory Visit: Payer: Self-pay | Admitting: Internal Medicine

## 2019-03-15 DIAGNOSIS — J90 Pleural effusion, not elsewhere classified: Secondary | ICD-10-CM | POA: Diagnosis not present

## 2019-03-15 DIAGNOSIS — I10 Essential (primary) hypertension: Secondary | ICD-10-CM | POA: Diagnosis not present

## 2019-03-15 DIAGNOSIS — C7951 Secondary malignant neoplasm of bone: Secondary | ICD-10-CM | POA: Diagnosis not present

## 2019-03-15 DIAGNOSIS — E785 Hyperlipidemia, unspecified: Secondary | ICD-10-CM | POA: Diagnosis not present

## 2019-03-15 DIAGNOSIS — C3491 Malignant neoplasm of unspecified part of right bronchus or lung: Secondary | ICD-10-CM | POA: Diagnosis not present

## 2019-03-15 DIAGNOSIS — E119 Type 2 diabetes mellitus without complications: Secondary | ICD-10-CM | POA: Diagnosis not present

## 2019-03-15 NOTE — Telephone Encounter (Signed)
Copied from [place] [id number]. Topic: Quick Communication - Rx Refill/Question >> Mar 15, 2019 10:58 AM Yvette Rack wrote: Medication: gabapentin (NEURONTIN) 100 MG capsule  Has the patient contacted their pharmacy? no  Preferred Pharmacy (with phone number or street name): [id number] - [place], [place] - [address] [phone] (Phone) [phone] (Fax)  Agent: Please be advised that RX refills may take up to 3 business days. We ask that you follow-up with your pharmacy.

## 2019-03-15 NOTE — Telephone Encounter (Signed)
Requested medication (s) are due for refill today: yes  Requested medication (s) are on the active medication list: yes  Last refill:  Historic provider 05/17/1015  Future visit scheduled: yes  Notes to clinic:  Historic provider    Requested Prescriptions  Pending Prescriptions Disp Refills   gabapentin (NEURONTIN) 100 MG capsule       Sig: Take 1 capsule (100 mg total) by mouth every evening.     Neurology: Anticonvulsants - gabapentin Passed - 03/15/2019 11:07 AM      Passed - Valid encounter within last 12 months    Recent Outpatient Visits          2 months ago Essential hypertension, benign   [place], Claudina Lick, MD   4 months ago Essential hypertension, benign   [place], Claudina Lick, MD   11 months ago Essential hypertension, benign   [facility], Claudina Lick, MD   1 year ago Essential hypertension, benign   [place], Claudina Lick, MD   1 year ago Hypercholesterolemia   [place], Claudina Lick, MD      Future Appointments            In 1 month  [place], [place]   In 3 months [place], Claudina Lick, MD [place], [place]

## 2019-03-17 DIAGNOSIS — E119 Type 2 diabetes mellitus without complications: Secondary | ICD-10-CM | POA: Diagnosis not present

## 2019-03-17 DIAGNOSIS — E785 Hyperlipidemia, unspecified: Secondary | ICD-10-CM | POA: Diagnosis not present

## 2019-03-17 DIAGNOSIS — J90 Pleural effusion, not elsewhere classified: Secondary | ICD-10-CM | POA: Diagnosis not present

## 2019-03-17 DIAGNOSIS — C3491 Malignant neoplasm of unspecified part of right bronchus or lung: Secondary | ICD-10-CM | POA: Diagnosis not present

## 2019-03-17 DIAGNOSIS — C7951 Secondary malignant neoplasm of bone: Secondary | ICD-10-CM | POA: Diagnosis not present

## 2019-03-17 DIAGNOSIS — I10 Essential (primary) hypertension: Secondary | ICD-10-CM | POA: Diagnosis not present

## 2019-03-20 DIAGNOSIS — E119 Type 2 diabetes mellitus without complications: Secondary | ICD-10-CM | POA: Diagnosis not present

## 2019-03-20 DIAGNOSIS — C7951 Secondary malignant neoplasm of bone: Secondary | ICD-10-CM | POA: Diagnosis not present

## 2019-03-20 DIAGNOSIS — J90 Pleural effusion, not elsewhere classified: Secondary | ICD-10-CM | POA: Diagnosis not present

## 2019-03-20 DIAGNOSIS — I10 Essential (primary) hypertension: Secondary | ICD-10-CM | POA: Diagnosis not present

## 2019-03-20 DIAGNOSIS — C3491 Malignant neoplasm of unspecified part of right bronchus or lung: Secondary | ICD-10-CM | POA: Diagnosis not present

## 2019-03-20 DIAGNOSIS — E785 Hyperlipidemia, unspecified: Secondary | ICD-10-CM | POA: Diagnosis not present

## 2019-03-22 DIAGNOSIS — J90 Pleural effusion, not elsewhere classified: Secondary | ICD-10-CM | POA: Diagnosis not present

## 2019-03-22 DIAGNOSIS — I10 Essential (primary) hypertension: Secondary | ICD-10-CM | POA: Diagnosis not present

## 2019-03-22 DIAGNOSIS — C7951 Secondary malignant neoplasm of bone: Secondary | ICD-10-CM | POA: Diagnosis not present

## 2019-03-22 DIAGNOSIS — E119 Type 2 diabetes mellitus without complications: Secondary | ICD-10-CM | POA: Diagnosis not present

## 2019-03-22 DIAGNOSIS — E785 Hyperlipidemia, unspecified: Secondary | ICD-10-CM | POA: Diagnosis not present

## 2019-03-22 DIAGNOSIS — C3491 Malignant neoplasm of unspecified part of right bronchus or lung: Secondary | ICD-10-CM | POA: Diagnosis not present

## 2019-03-27 DIAGNOSIS — J90 Pleural effusion, not elsewhere classified: Secondary | ICD-10-CM | POA: Diagnosis not present

## 2019-03-27 DIAGNOSIS — C3491 Malignant neoplasm of unspecified part of right bronchus or lung: Secondary | ICD-10-CM | POA: Diagnosis not present

## 2019-03-27 DIAGNOSIS — E785 Hyperlipidemia, unspecified: Secondary | ICD-10-CM | POA: Diagnosis not present

## 2019-03-27 DIAGNOSIS — C7951 Secondary malignant neoplasm of bone: Secondary | ICD-10-CM | POA: Diagnosis not present

## 2019-03-27 DIAGNOSIS — E119 Type 2 diabetes mellitus without complications: Secondary | ICD-10-CM | POA: Diagnosis not present

## 2019-03-27 DIAGNOSIS — I10 Essential (primary) hypertension: Secondary | ICD-10-CM | POA: Diagnosis not present

## 2019-03-29 ENCOUNTER — Other Ambulatory Visit: Payer: Self-pay

## 2019-03-29 ENCOUNTER — Inpatient Hospital Stay

## 2019-03-29 ENCOUNTER — Inpatient Hospital Stay (HOSPITAL_BASED_OUTPATIENT_CLINIC_OR_DEPARTMENT_OTHER): Admitting: Family

## 2019-03-29 ENCOUNTER — Encounter: Payer: Self-pay | Admitting: Family

## 2019-03-29 ENCOUNTER — Inpatient Hospital Stay: Attending: Hematology & Oncology

## 2019-03-29 VITALS — BP 138/58 | HR 79 | Temp 97.1°F | Resp 19 | Ht 61.0 in | Wt 103.0 lb

## 2019-03-29 VITALS — BP 121/52 | HR 72 | Resp 19

## 2019-03-29 DIAGNOSIS — R531 Weakness: Secondary | ICD-10-CM | POA: Insufficient documentation

## 2019-03-29 DIAGNOSIS — Z8673 Personal history of transient ischemic attack (TIA), and cerebral infarction without residual deficits: Secondary | ICD-10-CM | POA: Insufficient documentation

## 2019-03-29 DIAGNOSIS — C3411 Malignant neoplasm of upper lobe, right bronchus or lung: Secondary | ICD-10-CM | POA: Diagnosis not present

## 2019-03-29 DIAGNOSIS — R002 Palpitations: Secondary | ICD-10-CM | POA: Diagnosis not present

## 2019-03-29 DIAGNOSIS — M791 Myalgia, unspecified site: Secondary | ICD-10-CM | POA: Diagnosis not present

## 2019-03-29 DIAGNOSIS — H9190 Unspecified hearing loss, unspecified ear: Secondary | ICD-10-CM | POA: Insufficient documentation

## 2019-03-29 DIAGNOSIS — R630 Anorexia: Secondary | ICD-10-CM

## 2019-03-29 DIAGNOSIS — Z79899 Other long term (current) drug therapy: Secondary | ICD-10-CM | POA: Diagnosis not present

## 2019-03-29 DIAGNOSIS — Z885 Allergy status to narcotic agent status: Secondary | ICD-10-CM | POA: Insufficient documentation

## 2019-03-29 DIAGNOSIS — C3490 Malignant neoplasm of unspecified part of unspecified bronchus or lung: Secondary | ICD-10-CM | POA: Diagnosis not present

## 2019-03-29 DIAGNOSIS — Z888 Allergy status to other drugs, medicaments and biological substances status: Secondary | ICD-10-CM | POA: Insufficient documentation

## 2019-03-29 DIAGNOSIS — R0602 Shortness of breath: Secondary | ICD-10-CM | POA: Diagnosis not present

## 2019-03-29 DIAGNOSIS — C3431 Malignant neoplasm of lower lobe, right bronchus or lung: Secondary | ICD-10-CM

## 2019-03-29 DIAGNOSIS — M549 Dorsalgia, unspecified: Secondary | ICD-10-CM | POA: Diagnosis not present

## 2019-03-29 DIAGNOSIS — G542 Cervical root disorders, not elsewhere classified: Secondary | ICD-10-CM | POA: Diagnosis not present

## 2019-03-29 DIAGNOSIS — Z923 Personal history of irradiation: Secondary | ICD-10-CM | POA: Insufficient documentation

## 2019-03-29 DIAGNOSIS — C7951 Secondary malignant neoplasm of bone: Secondary | ICD-10-CM | POA: Insufficient documentation

## 2019-03-29 DIAGNOSIS — D508 Other iron deficiency anemias: Secondary | ICD-10-CM

## 2019-03-29 DIAGNOSIS — R05 Cough: Secondary | ICD-10-CM | POA: Insufficient documentation

## 2019-03-29 DIAGNOSIS — R110 Nausea: Secondary | ICD-10-CM | POA: Insufficient documentation

## 2019-03-29 DIAGNOSIS — I7300 Raynaud's syndrome without gangrene: Secondary | ICD-10-CM | POA: Insufficient documentation

## 2019-03-29 DIAGNOSIS — R5383 Other fatigue: Secondary | ICD-10-CM | POA: Insufficient documentation

## 2019-03-29 DIAGNOSIS — R634 Abnormal weight loss: Secondary | ICD-10-CM | POA: Insufficient documentation

## 2019-03-29 LAB — CMP (CANCER CENTER ONLY)
ALT: 7 U/L (ref 0–44)
AST: 19 U/L (ref 15–41)
Albumin: 4.6 g/dL (ref 3.5–5.0)
Alkaline Phosphatase: 334 U/L — ABNORMAL HIGH (ref 38–126)
Anion gap: 11 (ref 5–15)
BUN: 24 mg/dL — ABNORMAL HIGH (ref 8–23)
CO2: 24 mmol/L (ref 22–32)
Calcium: 9.3 mg/dL (ref 8.9–10.3)
Chloride: 101 mmol/L (ref 98–111)
Creatinine: 0.86 mg/dL (ref 0.44–1.00)
GFR, Est AFR Am: >60 mL/min (ref >60)
GFR, Estimated: 60 mL/min — ABNORMAL LOW (ref >60)
Glucose, Bld: 122 mg/dL — ABNORMAL HIGH (ref 70–99)
Potassium: 4.7 mmol/L (ref 3.5–5.1)
Sodium: 136 mmol/L (ref 135–145)
Total Bilirubin: 0.4 mg/dL (ref 0.3–1.2)
Total Protein: 7.2 g/dL (ref 6.5–8.1)

## 2019-03-29 LAB — CBC WITH DIFFERENTIAL (CANCER CENTER ONLY)
Abs Immature Granulocytes: 0.17 10*3/uL — ABNORMAL HIGH (ref 0.00–0.07)
Basophils Absolute: 0 10*3/uL (ref 0.0–0.1)
Basophils Relative: 0 %
Eosinophils Absolute: 0.1 10*3/uL (ref 0.0–0.5)
Eosinophils Relative: 1 %
HCT: 31 % — ABNORMAL LOW (ref 36.0–46.0)
Hemoglobin: 9.9 g/dL — ABNORMAL LOW (ref 12.0–15.0)
Immature Granulocytes: 2 %
Lymphocytes Relative: 5 %
Lymphs Abs: 0.4 10*3/uL — ABNORMAL LOW (ref 0.7–4.0)
MCH: 28.5 pg (ref 26.0–34.0)
MCHC: 31.9 g/dL (ref 30.0–36.0)
MCV: 89.3 fL (ref 80.0–100.0)
Monocytes Absolute: 0.8 10*3/uL (ref 0.1–1.0)
Monocytes Relative: 8 %
Neutro Abs: 7.7 10*3/uL (ref 1.7–7.7)
Neutrophils Relative %: 84 %
Platelet Count: 132 10*3/uL — ABNORMAL LOW (ref 150–400)
RBC: 3.47 MIL/uL — ABNORMAL LOW (ref 3.87–5.11)
RDW: 15.3 % (ref 11.5–15.5)
WBC Count: 9.3 10*3/uL (ref 4.0–10.5)
nRBC: 0 % (ref 0.0–0.2)

## 2019-03-29 MED ORDER — PREDNISONE 10 MG PO TABS: 10.0000 mg | ORAL_TABLET | Freq: Every day | ORAL | 3 refills | Status: AC

## 2019-03-29 MED ORDER — ZOLEDRONIC ACID 4 MG/5ML IV CONC
3.0000 mg | Freq: Once | INTRAVENOUS | Status: AC
  Administered 2019-03-29: 14:00:00 3 mg via INTRAVENOUS
  Filled 2019-03-29: qty 3.75

## 2019-03-29 MED ORDER — SODIUM CHLORIDE 0.9 % IV SOLN
510.0000 mg | Freq: Once | INTRAVENOUS | Status: AC
  Administered 2019-03-29: 510 mg via INTRAVENOUS
  Filled 2019-03-29: qty 510

## 2019-03-29 MED ORDER — SODIUM CHLORIDE 0.9 % IV SOLN
INTRAVENOUS | Status: DC
  Administered 2019-03-29: 13:00:00 via INTRAVENOUS
  Filled 2019-03-29 (×2): qty 250

## 2019-03-29 NOTE — Patient Instructions (Signed)
Ferumoxytol injection (Feraheme) What is this medicine? FERUMOXYTOL is an iron complex. Iron is used to make healthy red blood cells, which carry oxygen and nutrients throughout the body. This medicine is used to treat iron deficiency anemia. This medicine may be used for other purposes; ask your health care provider or pharmacist if you have questions. COMMON BRAND NAME(S): Feraheme What should I tell my health care provider before I take this medicine? They need to know if you have any of these conditions:  anemia not caused by low iron levels  high levels of iron in the blood  magnetic resonance imaging (MRI) test scheduled  an unusual or allergic reaction to iron, other medicines, foods, dyes, or preservatives  pregnant or trying to get pregnant  breast-feeding How should I use this medicine? This medicine is for injection into a vein. It is given by a health care professional in a hospital or clinic setting. Talk to your pediatrician regarding the use of this medicine in children. Special care may be needed. Overdosage: If you think you have taken too much of this medicine contact a poison control center or emergency room at once. NOTE: This medicine is only for you. Do not share this medicine with others. What if I miss a dose? It is important not to miss your dose. Call your doctor or health care professional if you are unable to keep an appointment. What may interact with this medicine? This medicine may interact with the following medications:  other iron products This list may not describe all possible interactions. Give your health care provider a list of all the medicines, herbs, non-prescription drugs, or dietary supplements you use. Also tell them if you smoke, drink alcohol, or use illegal drugs. Some items may interact with your medicine. What should I watch for while using this medicine? Visit your doctor or healthcare professional regularly. Tell your doctor or  healthcare professional if your symptoms do not start to get better or if they get worse. You may need blood work done while you are taking this medicine. You may need to follow a special diet. Talk to your doctor. Foods that contain iron include: whole grains/cereals, dried fruits, beans, or peas, leafy green vegetables, and organ meats (liver, kidney). What side effects may I notice from receiving this medicine? Side effects that you should report to your doctor or health care professional as soon as possible:  allergic reactions like skin rash, itching or hives, swelling of the face, lips, or tongue  breathing problems  changes in blood pressure  feeling faint or lightheaded, falls  fever or chills  flushing, sweating, or hot feelings  swelling of the ankles or feet Side effects that usually do not require medical attention (report to your doctor or health care professional if they continue or are bothersome):  diarrhea  headache  nausea, vomiting  stomach pain This list may not describe all possible side effects. Call your doctor for medical advice about side effects. You may report side effects to FDA at 1-800-FDA-1088. Where should I keep my medicine? This drug is given in a hospital or clinic and will not be stored at home. NOTE: This sheet is a summary. It may not cover all possible information. If you have questions about this medicine, talk to your doctor, pharmacist, or health care provider.  2020 Elsevier/Gold Standard (2016-06-01 20:21:10)  

## 2019-03-29 NOTE — Progress Notes (Signed)
Hematology and Oncology Follow Up Visit  Samantha Quinn [id number] December 24, 1927 85 y.o. 03/29/2019   Principle Diagnosis:  Stage IV adenocarcinoma of the right lung-mutation negative C6-7 left nerve root compression secondary to metastatic disease - PD-L1 (0%) /TMB of 9  Past Therapy: Status post radiation/chemotherapy for recurrence-completed October 2017 Status post right upper lobectomy in March 2013 for stage IB disease XRT to the cervical spine - started 04/02/2017 Tecentriq 1200 mg IV every 3 weeks s/p cycle 8 -- d/c progression Alimta/Avastin s/p cycle #5 -- d/c on 05/06/2018  Current Therapy:   Hospice care Aspirin 81 mg PO daily Zometa 4 mg IV q4 weeks - next dose on January 2021   Interim History:  Samantha Quinn is here today with her daughter for follow-up and Zometa. She is feeling fatigued and states that she does not have an appetite.  Hgb is down at 9.9 from 10.6. MCV 89 and platelets 132.  Her weight is down another 3 lbs. She does feel that she is hydrating well.  She describes her back pain as nagging but tolerable.  She has some mild SOB with exertion. She has a chronic dry cough that comes and goes.  She has occasional nausea (no vomiting) and will take omeprazole when needed.  No issue with infection. No fever, chills, rash, dizziness, chest pain, palpitations, abdominal pain or changes in bowel or bladder habits.  She will take a stool softener as needed for constipation.  No swelling in her extremities.  She has had issues with Raynaud's in the past and will sometimes get numbness and tingling in her fingertips. She will try wearing gloves now that the weather is getting cold.  She is taking her baby aspirin daily.  She has not noted any episodes of bleeding but does bruise easily. No petechiae.   ECOG Performance Status: 3 - Symptomatic, >50% confined to bed  Medications:  Allergies as of 03/29/2019      Reactions   Ramipril Other (See Comments)   Tongue  swelling   Tramadol Other (See Comments)   syncope syncope   Codeine Nausea And Vomiting   Demerol Nausea Only      Medication List       Accurate as of March 29, 2019 11:38 AM. If you have any questions, ask your nurse or doctor.        acetaminophen 500 MG tablet Commonly known as: TYLENOL Take 500 mg by mouth every 6 (six) hours as needed for mild pain.   acetaminophen-codeine 300-30 MG tablet Commonly known as: TYLENOL #3   amitriptyline 10 MG tablet Commonly known as: ELAVIL TAKE 1 TABLET BY MOUTH EVERYDAY AT BEDTIME   amLODipine 5 MG tablet Commonly known as: NORVASC Take 5 mg by mouth every other day.   bisoprolol-hydrochlorothiazide 2.5-6.25 MG tablet Commonly known as: ZIAC TAKE 1 TABLET BY MOUTH EVERY DAY   cholecalciferol 1000 units tablet Commonly known as: VITAMIN D Take 2,000 Units by mouth daily.   gabapentin 100 MG capsule Commonly known as: NEURONTIN Take 100 mg by mouth every evening.   glucose blood test strip Commonly known as: ONE TOUCH ULTRA TEST 1 each by Other route 2 (two) times daily. Use to check blood sugars twice a day   HYDROcodone-Acetaminophen 5-300 MG Tabs Take 1 tablet by mouth every 6 (six) hours as needed.   lovastatin 20 MG tablet Commonly known as: MEVACOR TAKE 1 TABLET BY MOUTH EVERY OTHER DAY   meclizine 12.5 MG tablet Commonly known  as: ANTIVERT Take 1-2 tablets (12.5-25 mg total) by mouth 3 (three) times daily as needed for dizziness.   metFORMIN 500 MG tablet Commonly known as: GLUCOPHAGE TAKE 1 TABLET (500 MG TOTAL) BY MOUTH 2 (TWO) TIMES DAILY WITH A MEAL.   methimazole 5 MG tablet Commonly known as: TAPAZOLE Take 2.5 mg every other day   ONE TOUCH ULTRA 2 w/Device Kit Use to check blood sugars twice a day Dx E11.9   onetouch ultrasoft lancets 1 each by Other route 2 (two) times daily. Use to help check blood sugars twice a day   polyethylene glycol 17 g packet Commonly known as: MIRALAX / GLYCOLAX  Take 17 g by mouth daily as needed for mild constipation.   senna 8.6 MG Tabs tablet Commonly known as: SENOKOT Take 1 tablet (8.6 mg total) by mouth 2 (two) times daily.   Senna 8.6 MG Caps 8.6 mg.   VISION-VITE PRESERVE PO Take 1 tablet by mouth daily.       Allergies:  Allergies  Allergen Reactions  . Ramipril Other (See Comments)    Tongue swelling  . Tramadol Other (See Comments)    syncope syncope  . Codeine Nausea And Vomiting  . Demerol Nausea Only    Past Medical History, Surgical history, Social history, and Family History were reviewed and updated.  Review of Systems: All other 10 point review of systems is negative.   Physical Exam:  vitals were not taken for this visit.   Wt Readings from Last 3 Encounters:  03/02/19 106 lb 8 oz (48.3 kg)  01/26/19 110 lb (49.9 kg)  12/22/18 111 lb (50.3 kg)    Ocular: Sclerae unicteric, pupils equal, round and reactive to light Ear-nose-throat: Oropharynx clear, dentition fair Lymphatic: No cervical or supraclavicular adenopathy Lungs no rales or rhonchi, good excursion bilaterally Heart regular rate and rhythm, no murmur appreciated Abd soft, nontender, positive bowel sounds, no liver or spleen tip palpated on exam, no fluid wave  MSK no focal spinal tenderness, no joint edema Neuro: non-focal, well-oriented, appropriate affect Breasts: Deferred   Lab Results  Component Value Date   WBC 9.3 03/29/2019   HGB 9.9 (L) 03/29/2019   HCT 31.0 (L) 03/29/2019   MCV 89.3 03/29/2019   PLT 132 (L) 03/29/2019   Lab Results  Component Value Date   FERRITIN 1,126 (H) 03/02/2019   IRON 39 (L) 03/02/2019   TIBC 254 03/02/2019   UIBC 215 03/02/2019   IRONPCTSAT 15 (L) 03/02/2019   Lab Results  Component Value Date   RETICCTPCT 2.1 05/06/2018   RBC 3.47 (L) 03/29/2019   No results found for: KPAFRELGTCHN, LAMBDASER, KAPLAMBRATIO No results found for: IGGSERUM, IGA, IGMSERUM No results found for: Odetta Pink, SPEI   Chemistry      Component Value Date/Time   NA 139 03/02/2019 1004   NA 144 04/07/2017 1215   NA 135 (L) 03/11/2017 1416   K 3.9 03/02/2019 1004   K 4.0 04/07/2017 1215   K 3.9 03/11/2017 1416   CL 101 03/02/2019 1004   CL 103 04/07/2017 1215   CO2 27 03/02/2019 1004   CO2 29 04/07/2017 1215   CO2 23 03/11/2017 1416   BUN 17 03/02/2019 1004   BUN 13 04/07/2017 1215   BUN 16.1 03/11/2017 1416   CREATININE 0.98 03/02/2019 1004   CREATININE 0.8 04/07/2017 1215   CREATININE 0.9 03/11/2017 1416      Component Value Date/Time  CALCIUM 9.4 03/02/2019 1004   CALCIUM 9.6 04/07/2017 1215   CALCIUM 9.6 03/11/2017 1416   ALKPHOS 291 (H) 03/02/2019 1004   ALKPHOS 60 04/07/2017 1215   ALKPHOS 79 03/11/2017 1416   AST 19 03/02/2019 1004   AST 11 03/11/2017 1416   ALT 6 03/02/2019 1004   ALT 15 04/07/2017 1215   ALT 11 03/11/2017 1416   BILITOT 0.4 03/02/2019 1004   BILITOT 0.26 03/11/2017 1416       Impression and Plan: Samantha Quinn is a very pleasant 85 yo caucasian female with progressive metastatic non small cell adenocarcinoma of the lung. She is now receiving Hospice care.  She is symptomatic with fatigue and loss of appetite/weight as mentioned above.  We will have her start Predisone 10 mg PO daily with breakfast and see if this helps.  She will monitor her blood sugars closely and let us know if these begin to rise with the steroid.  She received Zometa today as well as IV iron.  We will see what her iron studies show and bring her in for a second dose next week if needed.  We will plan to see her back in another 4 weeks for MD follow-up.  They promise to contact our office with any questions or concerns. We can certainly see her sooner if needed.   Laverna Peace, NP 12/2/202011:38 AM

## 2019-03-30 LAB — IRON AND TIBC
Iron: 56 ug/dL (ref 41–142)
Saturation Ratios: 22 % (ref 21–57)
TIBC: 258 ug/dL (ref 236–444)
UIBC: 202 ug/dL (ref 120–384)

## 2019-03-30 LAB — FERRITIN: Ferritin: 1967 ng/mL — ABNORMAL HIGH (ref 11–307)

## 2019-04-06 ENCOUNTER — Other Ambulatory Visit: Payer: Medicare Other

## 2019-04-06 ENCOUNTER — Ambulatory Visit: Payer: Medicare Other | Admitting: Hematology & Oncology

## 2019-04-06 ENCOUNTER — Ambulatory Visit: Payer: Medicare Other

## 2019-04-27 ENCOUNTER — Inpatient Hospital Stay

## 2019-04-27 ENCOUNTER — Other Ambulatory Visit: Payer: Self-pay

## 2019-04-27 ENCOUNTER — Inpatient Hospital Stay (HOSPITAL_BASED_OUTPATIENT_CLINIC_OR_DEPARTMENT_OTHER): Admitting: Hematology & Oncology

## 2019-04-27 ENCOUNTER — Encounter: Payer: Self-pay | Admitting: Hematology & Oncology

## 2019-04-27 VITALS — BP 124/45 | HR 75 | Temp 96.6°F | Resp 20 | Wt 94.0 lb

## 2019-04-27 DIAGNOSIS — C3490 Malignant neoplasm of unspecified part of unspecified bronchus or lung: Secondary | ICD-10-CM | POA: Diagnosis not present

## 2019-04-27 DIAGNOSIS — C7951 Secondary malignant neoplasm of bone: Secondary | ICD-10-CM

## 2019-04-27 DIAGNOSIS — C3431 Malignant neoplasm of lower lobe, right bronchus or lung: Secondary | ICD-10-CM

## 2019-04-27 DIAGNOSIS — D508 Other iron deficiency anemias: Secondary | ICD-10-CM

## 2019-04-27 DIAGNOSIS — C3411 Malignant neoplasm of upper lobe, right bronchus or lung: Secondary | ICD-10-CM | POA: Diagnosis not present

## 2019-04-27 DIAGNOSIS — E538 Deficiency of other specified B group vitamins: Secondary | ICD-10-CM

## 2019-04-27 LAB — CBC WITH DIFFERENTIAL (CANCER CENTER ONLY)
Abs Immature Granulocytes: 0.42 10*3/uL — ABNORMAL HIGH (ref 0.00–0.07)
Basophils Absolute: 0.1 10*3/uL (ref 0.0–0.1)
Basophils Relative: 0 %
Eosinophils Absolute: 0.2 10*3/uL (ref 0.0–0.5)
Eosinophils Relative: 1 %
HCT: 31 % — ABNORMAL LOW (ref 36.0–46.0)
Hemoglobin: 9.7 g/dL — ABNORMAL LOW (ref 12.0–15.0)
Immature Granulocytes: 3 %
Lymphocytes Relative: 4 %
Lymphs Abs: 0.5 10*3/uL — ABNORMAL LOW (ref 0.7–4.0)
MCH: 29.3 pg (ref 26.0–34.0)
MCHC: 31.3 g/dL (ref 30.0–36.0)
MCV: 93.7 fL (ref 80.0–100.0)
Monocytes Absolute: 0.9 10*3/uL (ref 0.1–1.0)
Monocytes Relative: 7 %
Neutro Abs: 10.6 10*3/uL — ABNORMAL HIGH (ref 1.7–7.7)
Neutrophils Relative %: 85 %
Platelet Count: 82 10*3/uL — ABNORMAL LOW (ref 150–400)
RBC: 3.31 MIL/uL — ABNORMAL LOW (ref 3.87–5.11)
RDW: 16.4 % — ABNORMAL HIGH (ref 11.5–15.5)
WBC Count: 12.6 10*3/uL — ABNORMAL HIGH (ref 4.0–10.5)
nRBC: 0 % (ref 0.0–0.2)

## 2019-04-27 LAB — CMP (CANCER CENTER ONLY)
ALT: 6 U/L (ref 0–44)
AST: 14 U/L — ABNORMAL LOW (ref 15–41)
Albumin: 4.1 g/dL (ref 3.5–5.0)
Alkaline Phosphatase: 342 U/L — ABNORMAL HIGH (ref 38–126)
Anion gap: 13 (ref 5–15)
BUN: 26 mg/dL — ABNORMAL HIGH (ref 8–23)
CO2: 24 mmol/L (ref 22–32)
Calcium: 9.4 mg/dL (ref 8.9–10.3)
Chloride: 100 mmol/L (ref 98–111)
Creatinine: 1.02 mg/dL — ABNORMAL HIGH (ref 0.44–1.00)
GFR, Est AFR Am: 56 mL/min — ABNORMAL LOW (ref >60)
GFR, Estimated: 49 mL/min — ABNORMAL LOW (ref >60)
Glucose, Bld: 145 mg/dL — ABNORMAL HIGH (ref 70–99)
Potassium: 3.7 mmol/L (ref 3.5–5.1)
Sodium: 137 mmol/L (ref 135–145)
Total Bilirubin: 0.5 mg/dL (ref 0.3–1.2)
Total Protein: 6.4 g/dL — ABNORMAL LOW (ref 6.5–8.1)

## 2019-04-27 LAB — FERRITIN: Ferritin: 3275 ng/mL — ABNORMAL HIGH (ref 11–307)

## 2019-04-27 LAB — IRON AND TIBC
Iron: 71 ug/dL (ref 41–142)
Saturation Ratios: 28 % (ref 21–57)
TIBC: 253 ug/dL (ref 236–444)
UIBC: 182 ug/dL (ref 120–384)

## 2019-04-27 MED ORDER — SODIUM CHLORIDE 0.9% FLUSH
10.0000 mL | INTRAVENOUS | Status: DC | PRN
  Filled 2019-04-27: qty 10

## 2019-04-27 MED ORDER — SODIUM CHLORIDE 0.9% FLUSH
3.0000 mL | Freq: Once | INTRAVENOUS | Status: DC | PRN
  Filled 2019-04-27: qty 10

## 2019-04-27 MED ORDER — ZOLEDRONIC ACID 4 MG/5ML IV CONC
3.0000 mg | Freq: Once | INTRAVENOUS | Status: AC
  Administered 2019-04-27: 3 mg via INTRAVENOUS
  Filled 2019-04-27: qty 3.75

## 2019-04-27 MED ORDER — SODIUM CHLORIDE 0.9 % IV SOLN
510.0000 mg | Freq: Once | INTRAVENOUS | Status: AC
  Administered 2019-04-27: 510 mg via INTRAVENOUS
  Filled 2019-04-27: qty 17

## 2019-04-27 MED ORDER — CYANOCOBALAMIN 1000 MCG/ML IJ SOLN
1000.0000 ug | Freq: Once | INTRAMUSCULAR | Status: AC
  Administered 2019-04-27: 1000 ug via INTRAMUSCULAR

## 2019-04-27 MED ORDER — SODIUM CHLORIDE 0.9 % IV SOLN
INTRAVENOUS | Status: DC
  Filled 2019-04-27: qty 250

## 2019-04-27 MED ORDER — CYANOCOBALAMIN 1000 MCG/ML IJ SOLN
INTRAMUSCULAR | Status: AC
  Filled 2019-04-27: qty 1

## 2019-04-27 NOTE — Patient Instructions (Addendum)
Cyanocobalamin, Vitamin B12 injection What is this medicine? CYANOCOBALAMIN (sye an oh koe BAL a min) is a man made form of vitamin B12. Vitamin B12 is used in the growth of healthy blood cells, nerve cells, and proteins in the body. It also helps with the metabolism of fats and carbohydrates. This medicine is used to treat people who can not absorb vitamin B12. This medicine may be used for other purposes; ask your health care provider or pharmacist if you have questions. COMMON BRAND NAME(S): B-12 Compliance Kit, B-12 Injection Kit, Cyomin, LA-12, Nutri-Twelve, Physicians EZ Use B-12, Primabalt What should I tell my health care provider before I take this medicine? They need to know if you have any of these conditions:  kidney disease  Leber's disease  megaloblastic anemia  an unusual or allergic reaction to cyanocobalamin, cobalt, other medicines, foods, dyes, or preservatives  pregnant or trying to get pregnant  breast-feeding How should I use this medicine? This medicine is injected into a muscle or deeply under the skin. It is usually given by a health care professional in a clinic or doctor's office. However, your doctor may teach you how to inject yourself. Follow all instructions. Talk to your pediatrician regarding the use of this medicine in children. Special care may be needed. Overdosage: If you think you have taken too much of this medicine contact a poison control center or emergency room at once. NOTE: This medicine is only for you. Do not share this medicine with others. What if I miss a dose? If you are given your dose at a clinic or doctor's office, call to reschedule your appointment. If you give your own injections and you miss a dose, take it as soon as you can. If it is almost time for your next dose, take only that dose. Do not take double or extra doses. What may interact with this medicine?  colchicine  heavy alcohol intake This list may not describe all  possible interactions. Give your health care provider a list of all the medicines, herbs, non-prescription drugs, or dietary supplements you use. Also tell them if you smoke, drink alcohol, or use illegal drugs. Some items may interact with your medicine. What should I watch for while using this medicine? Visit your doctor or health care professional regularly. You may need blood work done while you are taking this medicine. You may need to follow a special diet. Talk to your doctor. Limit your alcohol intake and avoid smoking to get the best benefit. What side effects may I notice from receiving this medicine? Side effects that you should report to your doctor or health care professional as soon as possible:  allergic reactions like skin rash, itching or hives, swelling of the face, lips, or tongue  blue tint to skin  chest tightness, pain  difficulty breathing, wheezing  dizziness  red, swollen painful area on the leg Side effects that usually do not require medical attention (report to your doctor or health care professional if they continue or are bothersome):  diarrhea  headache This list may not describe all possible side effects. Call your doctor for medical advice about side effects. You may report side effects to FDA at 1-800-FDA-1088. Where should I keep my medicine? Keep out of the reach of children. Store at room temperature between 15 and 30 degrees C (59 and 85 degrees F). Protect from light. Throw away any unused medicine after the expiration date. NOTE: This sheet is a summary. It may not cover  all possible information. If you have questions about this medicine, talk to your doctor, pharmacist, or health care provider.  2020 Elsevier/Gold Standard (2007-07-25 22:10:20)   Zoledronic Acid injection (Hypercalcemia, Oncology) What is this medicine? ZOLEDRONIC ACID (ZOE le dron ik AS id) lowers the amount of calcium loss from bone. It is used to treat too much calcium in  your blood from cancer. It is also used to prevent complications of cancer that has spread to the bone. This medicine may be used for other purposes; ask your health care provider or pharmacist if you have questions. COMMON BRAND NAME(S): Zometa What should I tell my health care provider before I take this medicine? They need to know if you have any of these conditions:  aspirin-sensitive asthma  cancer, especially if you are receiving medicines used to treat cancer  dental disease or wear dentures  infection  kidney disease  receiving corticosteroids like dexamethasone or prednisone  an unusual or allergic reaction to zoledronic acid, other medicines, foods, dyes, or preservatives  pregnant or trying to get pregnant  breast-feeding How should I use this medicine? This medicine is for infusion into a vein. It is given by a health care professional in a hospital or clinic setting. Talk to your pediatrician regarding the use of this medicine in children. Special care may be needed. Overdosage: If you think you have taken too much of this medicine contact a poison control center or emergency room at once. NOTE: This medicine is only for you. Do not share this medicine with others. What if I miss a dose? It is important not to miss your dose. Call your doctor or health care professional if you are unable to keep an appointment. What may interact with this medicine?  certain antibiotics given by injection  NSAIDs, medicines for pain and inflammation, like ibuprofen or naproxen  some diuretics like bumetanide, furosemide  teriparatide  thalidomide This list may not describe all possible interactions. Give your health care provider a list of all the medicines, herbs, non-prescription drugs, or dietary supplements you use. Also tell them if you smoke, drink alcohol, or use illegal drugs. Some items may interact with your medicine. What should I watch for while using this  medicine? Visit your doctor or health care professional for regular checkups. It may be some time before you see the benefit from this medicine. Do not stop taking your medicine unless your doctor tells you to. Your doctor may order blood tests or other tests to see how you are doing. Women should inform their doctor if they wish to become pregnant or think they might be pregnant. There is a potential for serious side effects to an unborn child. Talk to your health care professional or pharmacist for more information. You should make sure that you get enough calcium and vitamin D while you are taking this medicine. Discuss the foods you eat and the vitamins you take with your health care professional. Some people who take this medicine have severe bone, joint, and/or muscle pain. This medicine may also increase your risk for jaw problems or a broken thigh bone. Tell your doctor right away if you have severe pain in your jaw, bones, joints, or muscles. Tell your doctor if you have any pain that does not go away or that gets worse. Tell your dentist and dental surgeon that you are taking this medicine. You should not have major dental surgery while on this medicine. See your dentist to have a dental exam  and fix any dental problems before starting this medicine. Take good care of your teeth while on this medicine. Make sure you see your dentist for regular follow-up appointments. What side effects may I notice from receiving this medicine? Side effects that you should report to your doctor or health care professional as soon as possible:  allergic reactions like skin rash, itching or hives, swelling of the face, lips, or tongue  anxiety, confusion, or depression  breathing problems  changes in vision  eye pain  feeling faint or lightheaded, falls  jaw pain, especially after dental work  mouth sores  muscle cramps, stiffness, or weakness  redness, blistering, peeling or loosening of the skin,  including inside the mouth  trouble passing urine or change in the amount of urine Side effects that usually do not require medical attention (report to your doctor or health care professional if they continue or are bothersome):  bone, joint, or muscle pain  constipation  diarrhea  fever  hair loss  irritation at site where injected  loss of appetite  nausea, vomiting  stomach upset  trouble sleeping  trouble swallowing  weak or tired This list may not describe all possible side effects. Call your doctor for medical advice about side effects. You may report side effects to FDA at 1-800-FDA-1088. Where should I keep my medicine? This drug is given in a hospital or clinic and will not be stored at home. NOTE: This sheet is a summary. It may not cover all possible information. If you have questions about this medicine, talk to your doctor, pharmacist, or health care provider.  2020 Elsevier/Gold Standard (2013-09-09 14:19:39)   Ferumoxytol injection (Feraheme) What is this medicine? FERUMOXYTOL is an iron complex. Iron is used to make healthy red blood cells, which carry oxygen and nutrients throughout the body. This medicine is used to treat iron deficiency anemia. This medicine may be used for other purposes; ask your health care provider or pharmacist if you have questions. COMMON BRAND NAME(S): Feraheme What should I tell my health care provider before I take this medicine? They need to know if you have any of these conditions:  anemia not caused by low iron levels  high levels of iron in the blood  magnetic resonance imaging (MRI) test scheduled  an unusual or allergic reaction to iron, other medicines, foods, dyes, or preservatives  pregnant or trying to get pregnant  breast-feeding How should I use this medicine? This medicine is for injection into a vein. It is given by a health care professional in a hospital or clinic setting. Talk to your pediatrician  regarding the use of this medicine in children. Special care may be needed. Overdosage: If you think you have taken too much of this medicine contact a poison control center or emergency room at once. NOTE: This medicine is only for you. Do not share this medicine with others. What if I miss a dose? It is important not to miss your dose. Call your doctor or health care professional if you are unable to keep an appointment. What may interact with this medicine? This medicine may interact with the following medications:  other iron products This list may not describe all possible interactions. Give your health care provider a list of all the medicines, herbs, non-prescription drugs, or dietary supplements you use. Also tell them if you smoke, drink alcohol, or use illegal drugs. Some items may interact with your medicine. What should I watch for while using this medicine? Visit  your doctor or healthcare professional regularly. Tell your doctor or healthcare professional if your symptoms do not start to get better or if they get worse. You may need blood work done while you are taking this medicine. You may need to follow a special diet. Talk to your doctor. Foods that contain iron include: whole grains/cereals, dried fruits, beans, or peas, leafy green vegetables, and organ meats (liver, kidney). What side effects may I notice from receiving this medicine? Side effects that you should report to your doctor or health care professional as soon as possible:  allergic reactions like skin rash, itching or hives, swelling of the face, lips, or tongue  breathing problems  changes in blood pressure  feeling faint or lightheaded, falls  fever or chills  flushing, sweating, or hot feelings  swelling of the ankles or feet Side effects that usually do not require medical attention (report to your doctor or health care professional if they continue or are bothersome):  diarrhea  headache  nausea,  vomiting  stomach pain This list may not describe all possible side effects. Call your doctor for medical advice about side effects. You may report side effects to FDA at 1-800-FDA-1088. Where should I keep my medicine? This drug is given in a hospital or clinic and will not be stored at home. NOTE: This sheet is a summary. It may not cover all possible information. If you have questions about this medicine, talk to your doctor, pharmacist, or health care provider.  2020 Elsevier/Gold Standard (2016-06-01 20:21:10)

## 2019-04-27 NOTE — Progress Notes (Signed)
Hematology and Oncology Follow Up Visit  AKAYSHA COBERN [id number] 18-Oct-1927 85 y.o. 04/27/2019   Principle Diagnosis:  Stage IV adenocarcinoma of the right lung-mutation negative C6-7 left nerve root compression secondary to metastatic disease - PD-L1 (0%) /TMB of 9  Past Therapy: Status post radiation/chemotherapy for recurrence-completed October 2017 Status post right upper lobectomy in March 2013 for stage IB disease XRT to the cervical spine - started 04/02/2017 Tecentriq 1200 mg IV every 3 weeks s/p cycle 8 -- d/c progression Alimta/Avastin s/p cycle #5 -- d/c on 05/06/2018  Current Therapy:   Hospice care Aspirin 81 mg PO daily Zometa 4 mg IV q4 weeks - d/c on 04/27/2019   Interim History:  Ms. Starkel is here today with her daughter for follow-up and Zometa.  I think that it is now apparent where things are going.  Her weight is now down to 94 pounds.  She really is not eating all that much anymore.  She just does not that hungry.  I can tell that her body is starting to "wear out."  And again this does not surprise me.  She is having more difficulty hearing.  She does have a hearing aid in the right ear.  She is going to see about maybe getting this adjusted so she can at least hear a little bit better.  Her alkaline phosphatase continues to go up.  I think this is a good surrogate marker for her lung cancer.  I really think that this will be the last time that we have to give her a Zometa.  I do think Zometa is very helpful as she has extensive bony metastasis.  I want to try to prevent a fracture.  I think she is at a significant risk for fracturing, particularly if she falls.  She is not having any problems with bowels or bladder..  She has had some TIAs.  She continues to have these TIAs.  I am sure that she is hypercoagulable from her malignancy.  Her platelet count is dropping.  Her hemoglobin is also dropping.  Again I think this is all reflective of marrow  invasion by her malignancy.  I did had a long talk with Ms. Schlink today.  I told her and her daughter that I just did not think she was going to make it through the month of January.  I just think that her body has very little left to give.  The weight loss is very indicative of her short prognosis.  Hospice is doing a great job with her.  I think they are really helping her with her quality of life and her comfort.  They are doing a good job keeping her at home.  I am glad that she was able to enjoy Thanksgiving.  I am glad she was able to enjoy Christmas.  She will make it to [employer] day tomorrow.  Overall, her performance status is ECOG 3-4    Medications:  Allergies as of 04/27/2019      Reactions   Ramipril Swelling   Tongue swelling   Tramadol Other (See Comments)   syncope   Codeine Nausea And Vomiting   Demerol Nausea Only      Medication List       Accurate as of April 27, 2019 10:06 AM. If you have any questions, ask your nurse or doctor.        STOP taking these medications   amLODipine 5 MG tablet Commonly known as: NORVASC Stopped  by: [name], MD   onetouch ultrasoft lancets Stopped by: [name], MD     TAKE these medications   acetaminophen 500 MG tablet Commonly known as: TYLENOL Take 500 mg by mouth every 6 (six) hours as needed for mild pain.   acetaminophen-codeine 300-30 MG tablet Commonly known as: TYLENOL #3   amitriptyline 10 MG tablet Commonly known as: ELAVIL TAKE 1 TABLET BY MOUTH EVERYDAY AT BEDTIME   bisoprolol-hydrochlorothiazide 2.5-6.25 MG tablet Commonly known as: ZIAC TAKE 1 TABLET BY MOUTH EVERY DAY   cholecalciferol 1000 units tablet Commonly known as: VITAMIN D Take 2,000 Units by mouth daily.   gabapentin 100 MG capsule Commonly known as: NEURONTIN Take 100 mg by mouth every evening.   glucose blood test strip Commonly known as: ONE TOUCH ULTRA TEST 1 each by Other route 2 (two) times daily. Use  to check blood sugars twice a day   HYDROcodone-Acetaminophen 5-300 MG Tabs Take 1 tablet by mouth every 6 (six) hours as needed.   lovastatin 20 MG tablet Commonly known as: MEVACOR TAKE 1 TABLET BY MOUTH EVERY OTHER DAY   meclizine 12.5 MG tablet Commonly known as: ANTIVERT Take 1-2 tablets (12.5-25 mg total) by mouth 3 (three) times daily as needed for dizziness.   metFORMIN 500 MG tablet Commonly known as: GLUCOPHAGE TAKE 1 TABLET (500 MG TOTAL) BY MOUTH 2 (TWO) TIMES DAILY WITH A MEAL.   methimazole 5 MG tablet Commonly known as: TAPAZOLE Take 2.5 mg every other day   ONE TOUCH ULTRA 2 w/Device Kit Use to check blood sugars twice a day Dx E11.9   polyethylene glycol 17 g packet Commonly known as: MIRALAX / GLYCOLAX Take 17 g by mouth daily as needed for mild constipation.   predniSONE 10 MG tablet Commonly known as: DELTASONE Take 1 tablet (10 mg total) by mouth daily with breakfast.   senna 8.6 MG Tabs tablet Commonly known as: SENOKOT Take 1 tablet (8.6 mg total) by mouth 2 (two) times daily.   Senna 8.6 MG Caps 8.6 mg.   VISION-VITE PRESERVE PO Take 1 tablet by mouth daily.       Allergies:  Allergies  Allergen Reactions  . Ramipril Swelling    Tongue swelling  . Tramadol Other (See Comments)    syncope  . Codeine Nausea And Vomiting  . Demerol Nausea Only    Past Medical History, Surgical history, Social history, and Family History were reviewed and updated.  Review of Systems: Review of Systems  Constitutional: Positive for weight loss.  HENT: Positive for hearing loss.   Eyes: Negative.   Respiratory: Positive for shortness of breath.   Cardiovascular: Positive for palpitations.  Gastrointestinal: Negative.   Genitourinary: Negative.   Musculoskeletal: Positive for myalgias.  Skin: Negative.   Neurological: Positive for weakness.  Endo/Heme/Allergies: Negative.   Psychiatric/Behavioral: Negative.      Physical Exam:  weight is 94  lb (42.6 kg). Her temporal temperature is 96.6 F (35.9 C) (abnormal). Her blood pressure is 124/45 (abnormal) and her pulse is 75. Her respiration is 20 and oxygen saturation is 97%.   Wt Readings from Last 3 Encounters:  04/27/19 94 lb (42.6 kg)  03/29/19 103 lb (46.7 kg)  03/02/19 106 lb 8 oz (48.3 kg)    Physical Exam Vitals reviewed.  Constitutional:      Comments: This is an elderly white female.  She is becoming more cachectic.  She has muscle atrophy in upper and lower extremities.  There may be some slight temporal muscle wasting.  She clearly is hard of hearing.  HENT:     Head: Normocephalic and atraumatic.  Eyes:     Pupils: Pupils are equal, round, and reactive to light.  Cardiovascular:     Rate and Rhythm: Normal rate and regular rhythm.     Heart sounds: Normal heart sounds.  Pulmonary:     Effort: Pulmonary effort is normal.     Breath sounds: Normal breath sounds.  Abdominal:     General: Bowel sounds are normal.     Palpations: Abdomen is soft.  Musculoskeletal:        General: No tenderness or deformity. Normal range of motion.     Cervical back: Normal range of motion.  Lymphadenopathy:     Cervical: No cervical adenopathy.  Skin:    General: Skin is warm and dry.     Findings: No erythema or rash.  Neurological:     Mental Status: She is alert and oriented to person, place, and time.  Psychiatric:        Behavior: Behavior normal.        Thought Content: Thought content normal.        Judgment: Judgment normal.      Lab Results  Component Value Date   WBC 12.6 (H) 04/27/2019   HGB 9.7 (L) 04/27/2019   HCT 31.0 (L) 04/27/2019   MCV 93.7 04/27/2019   PLT 82 (L) 04/27/2019   Lab Results  Component Value Date   FERRITIN 1,967 (H) 03/29/2019   IRON 56 03/29/2019   TIBC 258 03/29/2019   UIBC 202 03/29/2019   IRONPCTSAT 22 03/29/2019   Lab Results  Component Value Date   RETICCTPCT 2.1 05/06/2018   RBC 3.31 (L) 04/27/2019   No results  found for: KPAFRELGTCHN, LAMBDASER, KAPLAMBRATIO No results found for: IGGSERUM, IGA, IGMSERUM No results found for: Odetta Pink, SPEI   Chemistry      Component Value Date/Time   NA 137 04/27/2019 0922   NA 144 04/07/2017 1215   NA 135 (L) 03/11/2017 1416   K 3.7 04/27/2019 0922   K 4.0 04/07/2017 1215   K 3.9 03/11/2017 1416   CL 100 04/27/2019 0922   CL 103 04/07/2017 1215   CO2 24 04/27/2019 0922   CO2 29 04/07/2017 1215   CO2 23 03/11/2017 1416   BUN 26 (H) 04/27/2019 0922   BUN 13 04/07/2017 1215   BUN 16.1 03/11/2017 1416   CREATININE 1.02 (H) 04/27/2019 0922   CREATININE 0.8 04/07/2017 1215   CREATININE 0.9 03/11/2017 1416      Component Value Date/Time   CALCIUM 9.4 04/27/2019 0922   CALCIUM 9.6 04/07/2017 1215   CALCIUM 9.6 03/11/2017 1416   ALKPHOS 342 (H) 04/27/2019 0922   ALKPHOS 60 04/07/2017 1215   ALKPHOS 79 03/11/2017 1416   AST 14 (L) 04/27/2019 0922   AST 11 03/11/2017 1416   ALT 6 04/27/2019 0922   ALT 15 04/07/2017 1215   ALT 11 03/11/2017 1416   BILITOT 0.5 04/27/2019 0922   BILITOT 0.26 03/11/2017 1416       Impression and Plan: Ms. Ochs is a very pleasant 85 yo caucasian female with progressive metastatic non small cell adenocarcinoma of the lung.  Again, I think that we clearly are in the end-stage of her illness now.  I still just want her to have comfort, respect and dignity.  Hospice is doing a  great job with this.  Again I just do not think that she is going to make it through the month of January.  We are making some medications adjustments.  She does not need prednisone.  I do not think she needs to be on cholesterol medications.  I think her blood pressure medicines also need to be stopped.  Also, the Metformin needs to be discontinued.  She does not need to have hypoglycemia.  Again, our focus now is strictly comfort.  I spent about 45 minutes with she and her daughter.  This  will be the last time that Ms. Andres makes it to the office.  This is really sad for me.  She has done so well.  Given her age, and the fact that she had metastatic lung cancer, she has done remarkably well.  She has done everything we have asked her to do.  Her body just has nothing left to give [place] and I think that the good Reita Cliche is going to call her home soon.  She is not afraid.  She has comfort knowing that she will be with Jesus.  She knows that she will win.  She just feels bad for her family.  Her family has done a great job with her.  They have really been incredible support for her.  They have done so much for her and gotten her to all of her appointments and make sure that she had a very active life outside of our office.    [name], MD 12/31/202010:06 AM

## 2019-04-28 DIAGNOSIS — C3491 Malignant neoplasm of unspecified part of right bronchus or lung: Secondary | ICD-10-CM | POA: Diagnosis not present

## 2019-04-28 DIAGNOSIS — I10 Essential (primary) hypertension: Secondary | ICD-10-CM | POA: Diagnosis not present

## 2019-04-28 DIAGNOSIS — F418 Other specified anxiety disorders: Secondary | ICD-10-CM | POA: Diagnosis not present

## 2019-04-28 DIAGNOSIS — S0280XD Fracture of other specified skull and facial bones, unspecified side, subsequent encounter for fracture with routine healing: Secondary | ICD-10-CM | POA: Diagnosis not present

## 2019-04-28 DIAGNOSIS — C7951 Secondary malignant neoplasm of bone: Secondary | ICD-10-CM | POA: Diagnosis not present

## 2019-04-28 DIAGNOSIS — S065X0D Traumatic subdural hemorrhage without loss of consciousness, subsequent encounter: Secondary | ICD-10-CM | POA: Diagnosis not present

## 2019-04-28 DIAGNOSIS — E0590 Thyrotoxicosis, unspecified without thyrotoxic crisis or storm: Secondary | ICD-10-CM | POA: Diagnosis not present

## 2019-04-28 DIAGNOSIS — E785 Hyperlipidemia, unspecified: Secondary | ICD-10-CM | POA: Diagnosis not present

## 2019-04-28 DIAGNOSIS — Z6820 Body mass index (BMI) 20.0-20.9, adult: Secondary | ICD-10-CM | POA: Diagnosis not present

## 2019-04-28 DIAGNOSIS — J90 Pleural effusion, not elsewhere classified: Secondary | ICD-10-CM | POA: Diagnosis not present

## 2019-04-28 DIAGNOSIS — E119 Type 2 diabetes mellitus without complications: Secondary | ICD-10-CM | POA: Diagnosis not present

## 2019-05-01 DIAGNOSIS — C7951 Secondary malignant neoplasm of bone: Secondary | ICD-10-CM | POA: Diagnosis not present

## 2019-05-01 DIAGNOSIS — J90 Pleural effusion, not elsewhere classified: Secondary | ICD-10-CM | POA: Diagnosis not present

## 2019-05-01 DIAGNOSIS — E785 Hyperlipidemia, unspecified: Secondary | ICD-10-CM | POA: Diagnosis not present

## 2019-05-01 DIAGNOSIS — C3491 Malignant neoplasm of unspecified part of right bronchus or lung: Secondary | ICD-10-CM | POA: Diagnosis not present

## 2019-05-01 DIAGNOSIS — I10 Essential (primary) hypertension: Secondary | ICD-10-CM | POA: Diagnosis not present

## 2019-05-01 DIAGNOSIS — E119 Type 2 diabetes mellitus without complications: Secondary | ICD-10-CM | POA: Diagnosis not present

## 2019-05-02 ENCOUNTER — Ambulatory Visit: Payer: Medicare Other

## 2019-05-02 DIAGNOSIS — J90 Pleural effusion, not elsewhere classified: Secondary | ICD-10-CM | POA: Diagnosis not present

## 2019-05-02 DIAGNOSIS — C7951 Secondary malignant neoplasm of bone: Secondary | ICD-10-CM | POA: Diagnosis not present

## 2019-05-02 DIAGNOSIS — I10 Essential (primary) hypertension: Secondary | ICD-10-CM | POA: Diagnosis not present

## 2019-05-02 DIAGNOSIS — E785 Hyperlipidemia, unspecified: Secondary | ICD-10-CM | POA: Diagnosis not present

## 2019-05-02 DIAGNOSIS — E119 Type 2 diabetes mellitus without complications: Secondary | ICD-10-CM | POA: Diagnosis not present

## 2019-05-02 DIAGNOSIS — C3491 Malignant neoplasm of unspecified part of right bronchus or lung: Secondary | ICD-10-CM | POA: Diagnosis not present

## 2019-05-04 DIAGNOSIS — C3491 Malignant neoplasm of unspecified part of right bronchus or lung: Secondary | ICD-10-CM | POA: Diagnosis not present

## 2019-05-04 DIAGNOSIS — I10 Essential (primary) hypertension: Secondary | ICD-10-CM | POA: Diagnosis not present

## 2019-05-04 DIAGNOSIS — E785 Hyperlipidemia, unspecified: Secondary | ICD-10-CM | POA: Diagnosis not present

## 2019-05-04 DIAGNOSIS — J90 Pleural effusion, not elsewhere classified: Secondary | ICD-10-CM | POA: Diagnosis not present

## 2019-05-04 DIAGNOSIS — E119 Type 2 diabetes mellitus without complications: Secondary | ICD-10-CM | POA: Diagnosis not present

## 2019-05-04 DIAGNOSIS — C7951 Secondary malignant neoplasm of bone: Secondary | ICD-10-CM | POA: Diagnosis not present

## 2019-05-05 DIAGNOSIS — I10 Essential (primary) hypertension: Secondary | ICD-10-CM | POA: Diagnosis not present

## 2019-05-05 DIAGNOSIS — C3491 Malignant neoplasm of unspecified part of right bronchus or lung: Secondary | ICD-10-CM | POA: Diagnosis not present

## 2019-05-05 DIAGNOSIS — J90 Pleural effusion, not elsewhere classified: Secondary | ICD-10-CM | POA: Diagnosis not present

## 2019-05-05 DIAGNOSIS — E785 Hyperlipidemia, unspecified: Secondary | ICD-10-CM | POA: Diagnosis not present

## 2019-05-05 DIAGNOSIS — C7951 Secondary malignant neoplasm of bone: Secondary | ICD-10-CM | POA: Diagnosis not present

## 2019-05-05 DIAGNOSIS — E119 Type 2 diabetes mellitus without complications: Secondary | ICD-10-CM | POA: Diagnosis not present

## 2019-05-08 ENCOUNTER — Ambulatory Visit: Payer: Medicare Other | Admitting: Internal Medicine

## 2019-05-08 DIAGNOSIS — C3491 Malignant neoplasm of unspecified part of right bronchus or lung: Secondary | ICD-10-CM | POA: Diagnosis not present

## 2019-05-08 DIAGNOSIS — I10 Essential (primary) hypertension: Secondary | ICD-10-CM | POA: Diagnosis not present

## 2019-05-08 DIAGNOSIS — C7951 Secondary malignant neoplasm of bone: Secondary | ICD-10-CM | POA: Diagnosis not present

## 2019-05-08 DIAGNOSIS — J90 Pleural effusion, not elsewhere classified: Secondary | ICD-10-CM | POA: Diagnosis not present

## 2019-05-08 DIAGNOSIS — E785 Hyperlipidemia, unspecified: Secondary | ICD-10-CM | POA: Diagnosis not present

## 2019-05-08 DIAGNOSIS — E119 Type 2 diabetes mellitus without complications: Secondary | ICD-10-CM | POA: Diagnosis not present

## 2019-05-10 DIAGNOSIS — E785 Hyperlipidemia, unspecified: Secondary | ICD-10-CM | POA: Diagnosis not present

## 2019-05-10 DIAGNOSIS — I10 Essential (primary) hypertension: Secondary | ICD-10-CM | POA: Diagnosis not present

## 2019-05-10 DIAGNOSIS — C7951 Secondary malignant neoplasm of bone: Secondary | ICD-10-CM | POA: Diagnosis not present

## 2019-05-10 DIAGNOSIS — E119 Type 2 diabetes mellitus without complications: Secondary | ICD-10-CM | POA: Diagnosis not present

## 2019-05-10 DIAGNOSIS — J90 Pleural effusion, not elsewhere classified: Secondary | ICD-10-CM | POA: Diagnosis not present

## 2019-05-10 DIAGNOSIS — C3491 Malignant neoplasm of unspecified part of right bronchus or lung: Secondary | ICD-10-CM | POA: Diagnosis not present

## 2019-05-11 DIAGNOSIS — E119 Type 2 diabetes mellitus without complications: Secondary | ICD-10-CM | POA: Diagnosis not present

## 2019-05-11 DIAGNOSIS — I10 Essential (primary) hypertension: Secondary | ICD-10-CM | POA: Diagnosis not present

## 2019-05-11 DIAGNOSIS — E785 Hyperlipidemia, unspecified: Secondary | ICD-10-CM | POA: Diagnosis not present

## 2019-05-11 DIAGNOSIS — J90 Pleural effusion, not elsewhere classified: Secondary | ICD-10-CM | POA: Diagnosis not present

## 2019-05-11 DIAGNOSIS — C7951 Secondary malignant neoplasm of bone: Secondary | ICD-10-CM | POA: Diagnosis not present

## 2019-05-11 DIAGNOSIS — C3491 Malignant neoplasm of unspecified part of right bronchus or lung: Secondary | ICD-10-CM | POA: Diagnosis not present

## 2019-05-12 DIAGNOSIS — I10 Essential (primary) hypertension: Secondary | ICD-10-CM | POA: Diagnosis not present

## 2019-05-12 DIAGNOSIS — J90 Pleural effusion, not elsewhere classified: Secondary | ICD-10-CM | POA: Diagnosis not present

## 2019-05-12 DIAGNOSIS — C3491 Malignant neoplasm of unspecified part of right bronchus or lung: Secondary | ICD-10-CM | POA: Diagnosis not present

## 2019-05-12 DIAGNOSIS — E785 Hyperlipidemia, unspecified: Secondary | ICD-10-CM | POA: Diagnosis not present

## 2019-05-12 DIAGNOSIS — E119 Type 2 diabetes mellitus without complications: Secondary | ICD-10-CM | POA: Diagnosis not present

## 2019-05-12 DIAGNOSIS — C7951 Secondary malignant neoplasm of bone: Secondary | ICD-10-CM | POA: Diagnosis not present

## 2019-05-16 DIAGNOSIS — E785 Hyperlipidemia, unspecified: Secondary | ICD-10-CM | POA: Diagnosis not present

## 2019-05-16 DIAGNOSIS — J90 Pleural effusion, not elsewhere classified: Secondary | ICD-10-CM | POA: Diagnosis not present

## 2019-05-16 DIAGNOSIS — I10 Essential (primary) hypertension: Secondary | ICD-10-CM | POA: Diagnosis not present

## 2019-05-16 DIAGNOSIS — E119 Type 2 diabetes mellitus without complications: Secondary | ICD-10-CM | POA: Diagnosis not present

## 2019-05-16 DIAGNOSIS — C3491 Malignant neoplasm of unspecified part of right bronchus or lung: Secondary | ICD-10-CM | POA: Diagnosis not present

## 2019-05-16 DIAGNOSIS — C7951 Secondary malignant neoplasm of bone: Secondary | ICD-10-CM | POA: Diagnosis not present

## 2019-05-17 DIAGNOSIS — J90 Pleural effusion, not elsewhere classified: Secondary | ICD-10-CM | POA: Diagnosis not present

## 2019-05-17 DIAGNOSIS — C7951 Secondary malignant neoplasm of bone: Secondary | ICD-10-CM | POA: Diagnosis not present

## 2019-05-17 DIAGNOSIS — C3491 Malignant neoplasm of unspecified part of right bronchus or lung: Secondary | ICD-10-CM | POA: Diagnosis not present

## 2019-05-17 DIAGNOSIS — E119 Type 2 diabetes mellitus without complications: Secondary | ICD-10-CM | POA: Diagnosis not present

## 2019-05-17 DIAGNOSIS — I10 Essential (primary) hypertension: Secondary | ICD-10-CM | POA: Diagnosis not present

## 2019-05-17 DIAGNOSIS — E785 Hyperlipidemia, unspecified: Secondary | ICD-10-CM | POA: Diagnosis not present

## 2019-05-18 DIAGNOSIS — J90 Pleural effusion, not elsewhere classified: Secondary | ICD-10-CM | POA: Diagnosis not present

## 2019-05-18 DIAGNOSIS — C3491 Malignant neoplasm of unspecified part of right bronchus or lung: Secondary | ICD-10-CM | POA: Diagnosis not present

## 2019-05-18 DIAGNOSIS — E119 Type 2 diabetes mellitus without complications: Secondary | ICD-10-CM | POA: Diagnosis not present

## 2019-05-18 DIAGNOSIS — C7951 Secondary malignant neoplasm of bone: Secondary | ICD-10-CM | POA: Diagnosis not present

## 2019-05-18 DIAGNOSIS — I10 Essential (primary) hypertension: Secondary | ICD-10-CM | POA: Diagnosis not present

## 2019-05-18 DIAGNOSIS — E785 Hyperlipidemia, unspecified: Secondary | ICD-10-CM | POA: Diagnosis not present

## 2019-05-19 DIAGNOSIS — C3491 Malignant neoplasm of unspecified part of right bronchus or lung: Secondary | ICD-10-CM | POA: Diagnosis not present

## 2019-05-19 DIAGNOSIS — E785 Hyperlipidemia, unspecified: Secondary | ICD-10-CM | POA: Diagnosis not present

## 2019-05-19 DIAGNOSIS — J90 Pleural effusion, not elsewhere classified: Secondary | ICD-10-CM | POA: Diagnosis not present

## 2019-05-19 DIAGNOSIS — I10 Essential (primary) hypertension: Secondary | ICD-10-CM | POA: Diagnosis not present

## 2019-05-19 DIAGNOSIS — C7951 Secondary malignant neoplasm of bone: Secondary | ICD-10-CM | POA: Diagnosis not present

## 2019-05-19 DIAGNOSIS — E119 Type 2 diabetes mellitus without complications: Secondary | ICD-10-CM | POA: Diagnosis not present

## 2019-05-20 DIAGNOSIS — I10 Essential (primary) hypertension: Secondary | ICD-10-CM | POA: Diagnosis not present

## 2019-05-20 DIAGNOSIS — E119 Type 2 diabetes mellitus without complications: Secondary | ICD-10-CM | POA: Diagnosis not present

## 2019-05-20 DIAGNOSIS — J90 Pleural effusion, not elsewhere classified: Secondary | ICD-10-CM | POA: Diagnosis not present

## 2019-05-20 DIAGNOSIS — E785 Hyperlipidemia, unspecified: Secondary | ICD-10-CM | POA: Diagnosis not present

## 2019-05-20 DIAGNOSIS — C7951 Secondary malignant neoplasm of bone: Secondary | ICD-10-CM | POA: Diagnosis not present

## 2019-05-20 DIAGNOSIS — C3491 Malignant neoplasm of unspecified part of right bronchus or lung: Secondary | ICD-10-CM | POA: Diagnosis not present

## 2019-05-21 DIAGNOSIS — E785 Hyperlipidemia, unspecified: Secondary | ICD-10-CM | POA: Diagnosis not present

## 2019-05-21 DIAGNOSIS — C3491 Malignant neoplasm of unspecified part of right bronchus or lung: Secondary | ICD-10-CM | POA: Diagnosis not present

## 2019-05-21 DIAGNOSIS — J90 Pleural effusion, not elsewhere classified: Secondary | ICD-10-CM | POA: Diagnosis not present

## 2019-05-21 DIAGNOSIS — C7951 Secondary malignant neoplasm of bone: Secondary | ICD-10-CM | POA: Diagnosis not present

## 2019-05-21 DIAGNOSIS — E119 Type 2 diabetes mellitus without complications: Secondary | ICD-10-CM | POA: Diagnosis not present

## 2019-05-21 DIAGNOSIS — I10 Essential (primary) hypertension: Secondary | ICD-10-CM | POA: Diagnosis not present

## 2019-05-22 DIAGNOSIS — E119 Type 2 diabetes mellitus without complications: Secondary | ICD-10-CM | POA: Diagnosis not present

## 2019-05-22 DIAGNOSIS — J90 Pleural effusion, not elsewhere classified: Secondary | ICD-10-CM | POA: Diagnosis not present

## 2019-05-22 DIAGNOSIS — I10 Essential (primary) hypertension: Secondary | ICD-10-CM | POA: Diagnosis not present

## 2019-05-22 DIAGNOSIS — C3491 Malignant neoplasm of unspecified part of right bronchus or lung: Secondary | ICD-10-CM | POA: Diagnosis not present

## 2019-05-22 DIAGNOSIS — E785 Hyperlipidemia, unspecified: Secondary | ICD-10-CM | POA: Diagnosis not present

## 2019-05-22 DIAGNOSIS — C7951 Secondary malignant neoplasm of bone: Secondary | ICD-10-CM | POA: Diagnosis not present

## 2019-05-23 DIAGNOSIS — J90 Pleural effusion, not elsewhere classified: Secondary | ICD-10-CM | POA: Diagnosis not present

## 2019-05-23 DIAGNOSIS — E785 Hyperlipidemia, unspecified: Secondary | ICD-10-CM | POA: Diagnosis not present

## 2019-05-23 DIAGNOSIS — E119 Type 2 diabetes mellitus without complications: Secondary | ICD-10-CM | POA: Diagnosis not present

## 2019-05-23 DIAGNOSIS — I10 Essential (primary) hypertension: Secondary | ICD-10-CM | POA: Diagnosis not present

## 2019-05-23 DIAGNOSIS — C3491 Malignant neoplasm of unspecified part of right bronchus or lung: Secondary | ICD-10-CM | POA: Diagnosis not present

## 2019-05-23 DIAGNOSIS — C7951 Secondary malignant neoplasm of bone: Secondary | ICD-10-CM | POA: Diagnosis not present

## 2019-05-24 ENCOUNTER — Telehealth: Payer: Self-pay | Admitting: *Deleted

## 2019-05-24 NOTE — Telephone Encounter (Signed)
Recd notice that patient expired on June 09, 2019 at 2128.  Dr Marin Olp notified.

## 2019-05-24 NOTE — Telephone Encounter (Signed)
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

## 2019-05-29 DEATH — deceased

## 2019-06-15 ENCOUNTER — Ambulatory Visit: Payer: Medicare Other | Admitting: Internal Medicine

## 2019-06-23 ENCOUNTER — Ambulatory Visit: Payer: Medicare Other | Admitting: Internal Medicine

## 2019-08-07 ENCOUNTER — Encounter (INDEPENDENT_AMBULATORY_CARE_PROVIDER_SITE_OTHER): Payer: Medicare Other | Admitting: Ophthalmology

## 2019-12-06 ENCOUNTER — Encounter (INDEPENDENT_AMBULATORY_CARE_PROVIDER_SITE_OTHER): Payer: Medicare Other | Admitting: Ophthalmology
